# Patient Record
Sex: Female | Born: 1935
Health system: Southern US, Community
[De-identification: ages and names within clinical notes are randomized; demographics above are authoritative.]

## PROBLEM LIST (undated history)

## (undated) DIAGNOSIS — G4733 Obstructive sleep apnea (adult) (pediatric): Secondary | ICD-10-CM

## (undated) DIAGNOSIS — I4892 Unspecified atrial flutter: Secondary | ICD-10-CM

## (undated) DIAGNOSIS — R9430 Abnormal result of cardiovascular function study, unspecified: Secondary | ICD-10-CM

## (undated) DIAGNOSIS — M7661 Achilles tendinitis, right leg: Secondary | ICD-10-CM

## (undated) DIAGNOSIS — R079 Chest pain, unspecified: Secondary | ICD-10-CM

## (undated) DIAGNOSIS — M5430 Sciatica, unspecified side: Secondary | ICD-10-CM

## (undated) DIAGNOSIS — W19XXXA Unspecified fall, initial encounter: Secondary | ICD-10-CM

## (undated) DIAGNOSIS — I4891 Unspecified atrial fibrillation: Secondary | ICD-10-CM

## (undated) DIAGNOSIS — IMO0002 Reserved for concepts with insufficient information to code with codable children: Secondary | ICD-10-CM

## (undated) DIAGNOSIS — Z9889 Other specified postprocedural states: Secondary | ICD-10-CM

## (undated) HISTORY — DX: Unspecified atrial fibrillation: I48.91

## (undated) HISTORY — DX: Abnormal result of cardiovascular function study, unspecified: R94.30

## (undated) HISTORY — DX: Chest pain, unspecified: R07.9

## (undated) HISTORY — PX: OTHER SURGICAL HISTORY: SHX169

## (undated) HISTORY — DX: Unspecified atrial flutter: I48.92

## (undated) HISTORY — DX: Other specified postprocedural states: Z98.890

## (undated) HISTORY — DX: Obstructive sleep apnea (adult) (pediatric): G47.33

## (undated) HISTORY — DX: Unspecified fall, initial encounter: W19.XXXA

## (undated) HISTORY — DX: Reserved for concepts with insufficient information to code with codable children: IMO0002

## (undated) NOTE — *Deleted (*Deleted)
Patient ID: [name]   MRN: [id number]      DOB: 1931/03/13  Date of Admission: 11/13/2019 Date of Discharge: 11/17/2019  Attending Physician:  Marvel Plan, MD, Stroke MD Consultant(s):    Critical Care Medicine - Dr Lavinia Sharps   Patient's PCP:  Rodrigo Ran, MD  DISCHARGE DIAGNOSIS:   Stroke [facility])  Acute ischemic right PCA stroke [facility])  Acute respiratory failure [facility])   Anticoagulation  Atrial Fibrillation   Past Medical History:  Diagnosis Date  . Atrial fibrillation (HCC)    Prior treatment with amiodarone, held sinus rhythm and drug stopped  . Atrial flutter (HCC)    Status post ablation prior to  2009  . Chest pain    Nuclear, 2004, no scar or ischemia, EF 77%  . Ejection fraction    EF 70%, nuclear,  2004  //     . Fall    July, 2014  . H/O bladder repair surgery    Past Surgical History:  Procedure Laterality Date  . IR CT HEAD LTD  11/14/2019  . IR PERCUTANEOUS ART THROMBECTOMY/INFUSION INTRACRANIAL INC DIAG ANGIO  11/14/2019  . IR US GUIDE VASC ACCESS RIGHT  11/14/2019  . no surgical hx    . RADIOLOGY WITH ANESTHESIA N/A 11/13/2019   Procedure: IR WITH ANESTHESIA;  Surgeon: Julieanne Cotton, MD;  Location: [facility] OR;  Service: Radiology;  Laterality: N/A;    Family History No family history on file.  Social History  reports that she has quit smoking. She has never used smokeless tobacco. She reports current alcohol use. She reports that she does not use drugs.  Allergies as of 11/17/2019      Reactions   Bee Venom Anaphylaxis   Mixed Vespid Venom Anaphylaxis   Codeine Other (See Comments)   Not known   Sulfa Antibiotics Itching, Nausea Only   Erythromycin Nausea And Vomiting    Med Rec must be completed prior to using this [facility]***       HOME MEDICATIONS PRIOR TO ADMISSION Medications Prior to Admission  Medication Sig Dispense Refill  . amoxicillin-clavulanate (AUGMENTIN) 875-125 MG tablet Take 1 tablet by mouth 2 (two)  times daily.    Marland Kitchen aspirin 81 MG tablet Take 1 tablet (81 mg total) by mouth daily. 30 tablet 3  . atorvastatin (LIPITOR) 10 MG tablet Take 10 mg by mouth daily.    Marland Kitchen BIOTIN PO Take 1 tablet by mouth daily.     Marland Kitchen CALCIUM PO Take 1 tablet by mouth daily.     . Cholecalciferol (VITAMIN D3) 50 MCG (2000 UT) CAPS Take 2,000 Units by mouth daily.    . Coenzyme Q10 (CO Q-10 PO) Take 1 tablet by mouth daily.     . metoprolol succinate (TOPROL-XL) 50 MG 24 hr tablet Take 50 mg by mouth daily. Take with or immediately following a meal.    . EPINEPHrine 0.3 mg/0.3 mL IJ SOAJ injection Inject 0.3 mg into the muscle as needed for anaphylaxis.     . Investigational - Study Medication Additional Study Details    . trimethoprim (TRIMPEX) 100 MG tablet Take 100 mg by mouth as directed. (Patient not taking: Reported on 11/14/2019)       HOSPITAL MEDICATIONS .  stroke: mapping our early stages of recovery book   Does not apply Once  . amoxicillin-clavulanate  1 tablet Oral Q12H  . aspirin EC  325 mg Oral Daily  . chlorhexidine gluconate (MEDLINE KIT)  15  mL Mouth Rinse BID  . Chlorhexidine Gluconate Cloth  6 each Topical Daily  . docusate sodium  100 mg Oral BID  . heparin injection (subcutaneous)  5,000 Units Subcutaneous Q8H  . mouth rinse  15 mL Mouth Rinse QID  . pantoprazole  40 mg Oral Daily  . polyethylene glycol  17 g Oral Daily  . thrombin 5,000 units for percutaneous treatment of pseudoaneurysm   Percutaneous STAT    LABORATORY STUDIES CBC    Component Value Date/Time   WBC 9.2 11/17/2019 0220   RBC 2.87 (L) 11/17/2019 0220   HGB 9.1 (L) 11/17/2019 0220   HCT 28.3 (L) 11/17/2019 0220   PLT 283 11/17/2019 0220   MCV 98.6 11/17/2019 0220   MCH 31.7 11/17/2019 0220   MCHC 32.2 11/17/2019 0220   RDW 12.4 11/17/2019 0220   LYMPHSABS 1.5 11/13/2019 2244   MONOABS 0.8 11/13/2019 2244   EOSABS 0.2 11/13/2019 2244   BASOSABS 0.1 11/13/2019 2244   CMP    Component Value Date/Time   NA  139 11/17/2019 0220   K 4.0 11/17/2019 0220   CL 105 11/17/2019 0220   CO2 27 11/17/2019 0220   GLUCOSE 117 (H) 11/17/2019 0220   BUN 5 (L) 11/17/2019 0220   CREATININE 0.66 11/17/2019 0220   CALCIUM 8.1 (L) 11/17/2019 0220   PROT 5.1 (L) 11/15/2019 0408   ALBUMIN 2.4 (L) 11/15/2019 0408   AST 24 11/15/2019 0408   ALT 32 11/15/2019 0408   ALKPHOS 37 (L) 11/15/2019 0408   BILITOT 0.9 11/15/2019 0408   GFRNONAA >60 11/17/2019 0220   COAGS Lab Results  Component Value Date   INR 1.0 11/13/2019   Lipid Panel    Component Value Date/Time   CHOL 70 11/14/2019 0408   TRIG 126 11/14/2019 0408   TRIG 127 11/14/2019 0408   HDL 19 (L) 11/14/2019 0408   CHOLHDL 3.7 11/14/2019 0408   VLDL 25 11/14/2019 0408   LDLCALC 26 11/14/2019 0408   HgbA1C  Lab Results  Component Value Date   HGBA1C 5.7 (H) 11/14/2019   Urinalysis No results found for: COLORURINE, APPEARANCEUR, LABSPEC, PHURINE, GLUCOSEU, HGBUR, BILIRUBINUR, KETONESUR, PROTEINUR, UROBILINOGEN, NITRITE, LEUKOCYTESUR Urine Drug Screen No results found for: LABOPIA, COCAINSCRNUR, LABBENZ, AMPHETMU, THCU, LABBARB  Alcohol Level    Component Value Date/Time   ETH <10 11/13/2019 2244     SIGNIFICANT DIAGNOSTIC STUDIES  CT Code Stroke CTA Head W/WO contrast  CT Code Stroke CTA Neck W/WO contrast 11/13/2019 IMPRESSION:  1. Positive for occlusion of the Right PCA origin.  2. No other large vessel occlusion, and generally mild for age atherosclerosis in the head and neck.  3. Emphysema (ICD10-J43.9) with layering left pleural effusion, patchy left upper lobe opacity compatible with pneumonia in this setting.  4. Aortic Atherosclerosis (ICD10-I70.0)   CT HEAD WO CONTRAST 11/14/2019 IMPRESSION:  1. Stable patchy right thalamic infarct with resolved contrast staining. No significant mass effect. No other acute or evolving ischemia by CT.  2. Stable to slightly regressed trace subarachnoid blood in the interpeduncular  cistern. No other intracranial hemorrhage.   CT HEAD WO CONTRAST 11/14/2019 IMPRESSION:  1. Status post Right PCA endovascular revascularization with trace subarachnoid blood/contrast in the interpeduncular cistern.  2. Small 10 mm area of contrast staining versus petechial hemorrhage in the medial right thalamus. And question subtle increased right thalamic hypodensity elsewhere, but no other Right PCA territory infarct changes are identified.    MR ANGIO HEAD WO CONTRAST 11/14/2019 IMPRESSION:  Acute infarct right thalamus with associated mild hemorrhage as noted on recent CT. There is also mild subarachnoid hemorrhage in the interpeduncular cistern unchanged from CT. Additional small areas of acute infarct in the right cerebellum and right frontal cortex. Moderate atrophy. Chronic microvascular ischemic change in the white matter. Right PCA patent post thrombectomy. There is a mild stenosis in the right PCA at the P1/ P2 junction.   MR BRAIN WO CONTRAST 11/14/2019 IMPRESSION:  Acute infarct right thalamus with associated mild hemorrhage as noted on recent CT. There is also mild subarachnoid hemorrhage in the interpeduncular cistern unchanged from CT. Additional small areas of acute infarct in the right cerebellum and right frontal cortex. Moderate atrophy. Chronic microvascular ischemic change in the white matter. Right PCA patent post thrombectomy. There is a mild stenosis in the right PCA at the P1/ P2 junction.    US Guided Needle Placement 11/16/2019 IMPRESSION:  Status post ultrasound-guided needle placement and thrombin injection of right common femoral artery pseudoaneurysm.   [place] Lower Ext Art Right Ltd 11/16/2019 IMPRESSION:  Status post ultrasound-guided needle placement and thrombin injection of right common femoral artery pseudoaneurysm.    IR CT Head Ltd 11/14/2019 IIMPRESSION:  Status post ultrasound guided access right common femoral artery for cervical and cerebral  angiogram and mechanical thrombectomy of right posterior cerebral artery for treatment of posterior circulation ELVO, with TICI 3 flow at the conclusion. Signed, Yvone Neu. Reyne Dumas, RPVI [facility] Radiology PLAN: The patient will remain intubated, as small volume subarachnoid hemorrhage was confirmed with formal CT ICU status Target systolic blood pressure of 120-140 Right hip straight time 6 hours Frequent neurovascular checks Repeat neurologic imaging with CT and/MRI at the discretion of neurology team   IR US Guide Vasc Access Right 11/14/2019 IMPRESSION:  Status post ultrasound guided access right common femoral artery for cervical and cerebral angiogram and mechanical thrombectomy of right posterior cerebral artery for treatment of posterior circulation ELVO, with TICI 3 flow at the conclusion. Signed, Yvone Neu. Reyne Dumas, RPVI [facility] Radiology PLAN: The patient will remain intubated, as small volume subarachnoid hemorrhage was confirmed with formal CT ICU status Target systolic blood pressure of 120-140 Right hip straight time 6 hours Frequent neurovascular checks Repeat neurologic imaging with CT and/MRI at the discretion of neurology team   DG CHEST PORT 1 VIEW 11/14/2019 IMPRESSION:  Endotracheal tube placed with tip measuring 4.7 cm above the carina. Cardiac enlargement with small bilateral pleural effusions. Probable chronic interstitial changes.   VAS [place] GROIN PSEUDOANEURYSM 11/17/2019 Summary:  No evidence of pseudoaneurysm, AVF or DVT    Preliminary    VAS [place] GROIN PSEUDOANEURYSM 11/16/2019 Findings:  An area with well defined borders measuring 2.0 cm x 2.4 cm was visualized arising off of the CFA distal with ultrasound characteristics of a pseudoaneurysm. The neck measures approximately 0.3 cm wide and 0.9 cm long. A mixed echogenic structure is visualized at the medial to CFV  and CFA with ultrasound characteristics of a hematoma.    Final    ECHOCARDIOGRAM COMPLETE 11/14/2019 IMPRESSIONS   1. Left ventricular ejection fraction, by estimation, is 60 to 65%. The left ventricle has normal function. The left ventricle has no regional wall motion abnormalities. There is severe asymmetric left ventricular hypertrophy of the basal-septal segment. Left ventricular diastolic function could not be evaluated.   2. Right ventricular systolic function is normal. The right ventricular size is normal. There is mildly elevated  pulmonary artery systolic pressure.   3. Left atrial size was moderately dilated.   4. A small pericardial effusion is present. The pericardial effusion is circumferential.   5. The mitral valve is normal in structure. Trivial mitral valve regurgitation. No evidence of mitral stenosis. Moderate mitral annular calcification.   6. The aortic valve is tricuspid. There is mild calcification of the aortic valve. There is mild thickening of the aortic valve. Aortic valve regurgitation is trivial. Mild aortic valve sclerosis is present, with no evidence of aortic valve stenosis.   7. The inferior vena cava is normal in size with greater than 50% respiratory variability, suggesting right atrial pressure of 3 mmHg. Conclusion(s)/Recommendation(s): No intracardiac source of embolism detected on this transthoracic study. A transesophageal echocardiogram is recommended to exclude cardiac source of embolism if clinically indicated. Basal septal hypertrophy of LV that is severe, otherwise diffuse moderate LVH. No focal wall motion abnormalities.   IR PERCUTANEOUS ART THROMBECTOMY/INFUSION INTRACRANIAL INC DIAG ANGIO 11/14/2019 IMPRESSION:  Status post ultrasound guided access right common femoral artery for cervical and cerebral angiogram and mechanical thrombectomy of right posterior cerebral artery for treatment of posterior circulation ELVO, with TICI 3 flow at the  conclusion. Signed, Yvone Neu. Reyne Dumas, RPVI [facility] Radiology PLAN: The patient will remain intubated, as small volume subarachnoid hemorrhage was confirmed with formal CT ICU status Target systolic blood pressure of 120-140 Right hip straight time 6 hours Frequent neurovascular checks Repeat neurologic imaging with CT and/MRI at the discretion of neurology team   CT HEAD CODE STROKE WO CONTRAST 11/13/2019 IMPRESSION:  Age indeterminate small vessel disease including in the right thalamus, but no acute cortically based infarct or acute intracranial hemorrhage identified. ASPECTS 10.      HISTORY OF PRESENT ILLNESS (From H&P by Dr Iver Nestle 11/13/19) [name] is a 79 y.o. female with a PMHx of Afib/flutter s/p ablation, off AC due to persistent sinus rhythm, and a recent diagnosis of pneumonia, presenting with acute onset right gaze preference and left-sided weakness. She was last seen well by her family at about 9:30 PM. She was watching television with her husband and when her son passed by at about 10 p.m. he noticed that she was not as responsive to him as she normally is. As he checked on her more he became concerned that she was unwell and activated EMS. Regarding her pneumonia, her only symptoms were generalized fatigue none. She was not improving on the first round of antibiotics tried and therefore was broadened to Augmentin on Monday. She was overall slowly improving. Family and patient deny other symptoms such as headache, vision changes, fevers, chills, sweats, shortness of breath. LKW: 9:15  PM tPA given?: Yes, time given 10:59 PM IA performed?: Yes Premorbid modified rankin scale:      0 - No symptoms Initial NIH stroke scale was 11:             2 for answering month and age incorrectly,1 for gaze preference to the right, 2 4 and left facial droop, on 1 for left arm drift, 1 for right leg drift, 1 for left leg drift, 1 for  reduced sensation on the left upper extremity, 1 for dysarthria, and 1 for extinction (gaze preference to the right with head turn to the right)  HOSPITAL COURSE [name] is a 33 y.o. female with history of Afib/flutter s/p ablation, off AC due to persistent sinus rhythm,and a recent diagnosis of pneumonia (  she was not improving on the first round of antibiotics tried and therefore was broadened to Augmentin on Monday), presenting with acute onset right gaze preference and left-sided weakness. The patient received IV t-PA Tuesday 11/19 at 10:59 PM and sent to IR for R PCA occlusion.  Stroke: R thalamic, cerebellar, and frontal small infarcts with right PCA occlusion s/p tPA and IR with TICI3 from AF not on AC Small SAH interpeduncular cistern post IR  CT Head - Age indeterminate small vessel disease including in the right thalamus, but no acute cortically based infarct or acute intracranial hemorrhage identified.   CTA H&N - Positive for occlusion of the Right PCA origin. No other large vessel occlusion, and generally mild for age atherosclerosis in the head and neck.   Cerebral angio/ IR - Rt PCA occlusion with TICI 3 flow   CT head - Status post Right PCA endovascular revascularization with trace subarachnoid blood/contrast in the interpeduncular cistern. Small 10 mm area of contrast staining versus petechial hemorrhage in the medial right thalamus.   MRI head - R thalamus infarct with associated mild hemorrhage as noted on recent CT. There is also mild subarachnoid hemorrhage in the interpeduncular cistern unchanged from CT. Additional small areas of acute infarct in the right cerebellum and right frontal cortex. Moderate atrophy   MRA head -  Right PCA patent post thrombectomy. There is a mild stenosis in the right PCA at the P1/ P2 junction.   2D Echo - EF 60-65%. No source of embolus. LA moderately dilated  [employer] Virus 2 - negative  LDL - 26  HgbA1c - 5.7  VTE  prophylaxis - Heparin 5000 units sq tid   aspirin 81 mg daily prior to admission, now on aspirin 325 mg daily. Consider AC once SAH resolved (probably in 5-7 days post stroke)    Therapy recommendations:  HH OT, HH PT  Disposition:  Pending - anticipate d/c home 10/23 if remains stable  R Groin Hematoma  Developed hospital D#2 at angio puncture site  Groin pseudoaneurysm [place] 2.0 x 2.4 R CFA pseudoaneurysm w/ hematoma  Thrombin injection 10/22 [address])  Repeat US 10/23 preliminary - no evidence of pseudoaneurysm - preliminary report.  Acute Respiratory Failure, resolved  Intubated for IR, left intubated post IR   Sedated w/ fentanyl and propofol, now off  Extubated 10/20  Patient stable  CCM signed off   Atrial Fibrillation/Aflutter  Home anticoagulation:  none   post ablation Dr. Gentry Fitz 2004. Doing very well. No further recurrences.  No AC now given post tPA small SAH   Now on aspirin 325  Consider AC once SAH resolved (probably in 5-7 days post stroke)     Follows with Dr. Anne Fu at [facility]   Community Acquired PNA  No response to intial abx  Broadened as OP to Augmention 10/18  Still has intermittent coughing  CTA - layering left pleural effusion, patchy left upper lobe opacity compatible with pneumonia  CXR 10/20 - small bilateral pleural effusions. Probable chronic interstitial changes.  WBCs 6.3->7.6->7.3 (afebrile)  Hypertension  Home BP meds: metoprolol   Treated short term w/ cleviprex, off this am  BP stable  BP goal < 160   Long-term BP goal normotensive  Hyperlipidemia  Home Lipid lowering medication: Lipitor 10 mg daily  LDL 26, goal < 70  Will not resume lipitor due to low LDL level  Dysphagia, resolved  Secondary to stroke  Cleared for regular consistency, thin liquid diet  Off IVF  Sore throat after extubation   Other Stroke Risk Factors  Advanced age  Former cigarette smoker - quit  ETOH use,  advised to drink no more than 1 alcoholic beverage per day.  Other Active Problems  Code status - Full code  Emphysema ([id number])   Aortic Atherosclerosis (ICD10-I70.0)   Anemia - Hgb - 12.8->13.3-> 10.5->9.8->8.8->9.1  Hypokalemia - K 3.8->4.0   DISCHARGE EXAM Vitals:   11/17/19 0230 11/17/19 0350 11/17/19 0627 11/17/19 0717  BP: (!) 167/78 (!) 160/69 (!) 167/79 (!) 149/83  Pulse: 86 94 92 90  Resp: 18 18 18 18   Temp:  98.6 F (37 C)  98.8 F (37.1 C)  TempSrc:  Oral  Oral  SpO2: 94% 94% 98% 97%  Weight:      Height:       General - Well nourished, well developed, no acute distress.  Ophthalmologic - fundi not visualized due to noncooperation.  Cardiovascular - irregularly irregular heart rate and rhythm.  Mental Status -  Level of arousal and orientation to place, age, month and person were intact, however, not orientated to year. Language exam showed no aphasia, able to name and repeat, follow simple commands.    Cranial Nerves II - XII - II - Visual field intact OU. III, IV, VI - Extraocular movements intact. V - Facial sensation intact bilaterally. VII - subtle left nasolabial fold flattening. VIII - Hearing & vestibular intact bilaterally. X - Palate elevates symmetrically. XI - Chin turning & shoulder shrug intact bilaterally. XII - Tongue protrusion intact.  Motor Strength - The patient's strength was normal in right upper and lower extremities, left upper extremity drift with mildly decreased finger grip, left LE 4/5 proximal and 4+/5 distally.  Bulk was normal and fasciculations were absent.   Motor Tone - Muscle tone was assessed at the neck and appendages and was normal.  Reflexes - The patient's reflexes were symmetrical in all extremities and she had no pathological reflexes.  Sensory - Light touch, temperature/pinprick were assessed and were symmetrical.    Coordination - The patient had normal movements in the hands with no ataxia  or dysmetria.  Tremor was absent.  Gait and Station - deferred.  DISCHARGE INSTRUCTIONS GIVEN TO THE PATIENT   DISCHARGE DIET   Diet Order            Diet Heart Room service appropriate? Yes; Fluid consistency: Thin  Diet effective now                liquids  DISCHARGE PLAN  Disposition:  ***  {anticoagulants:31417} for secondary stroke prevention.  Ongoing risk factor control by Primary Care Physician at time of discharge  Follow-up Rodrigo Ran, MD in 2 weeks.  Follow-up in [facility] in 4 weeks, office to schedule an appointment.   *** minutes were spent preparing discharge.  ***

## (undated) NOTE — *Deleted (*Deleted)
PMR Admission Coordinator Pre-Admission Assessment  Patient: [name] is an 51 y.o., female MRN: [id number] DOB: 1932/01/08 Height: 5\' 1"  (154.9 cm) Weight: 66.4 kg  Insurance Information HMO:     PPO:      PCP:      IPA:      80/20: yes     OTHER:  PRIMARY: Medicare A & B      Policy#: [id number]      Subscriber: patient CM Name:       Phone#:      Fax#:  Pre-Cert#:       Employer:  Benefits:  Phone #: verified online via [employer] on 11/17/19     Name:  Eff. Date: Part A & B effective 01/25/1998     Deduct: $1,4848      Out of Pocket Max: NA      Life Max: NA CIR: 100% with Medicare approval      SNF: 100% days 1-20, 80% days 21-100 Outpatient: 80%     Co-Pay: 20% Home Health: 100%      Co-Pay:  DME: 80%     Co-Pay: 20% Providers: pt's choice SECONDARY:       Policy#:      Phone#:   Artist:       Phone#:   The Engineer, materials Information Summary" for patients in Inpatient Rehabilitation Facilities with attached "Privacy Act Statement-Health Care Records" was provided and verbally reviewed with: {CHL IP Patient Family WU:981191478}  Emergency Contact Information Contact Information    Name Relation Home Work Mobile   Weiser Spouse [phone]     Unique, Sillas   [phone]   [name] Daughter   [phone]   Marius Ditch Daughter   [phone]   Swanson,Sidonie Daughter   [phone]      Current Medical History  Patient Admitting Diagnosis: acute ischemic right PCA stroke History of Present Illness: *** Complete NIHSS TOTAL: 2  Patient's medical record from [facility] has been reviewed by the rehabilitation admission coordinator and physician.  Past Medical History  Past Medical History:  Diagnosis Date  . Atrial fibrillation (HCC)    Prior treatment with amiodarone, held sinus rhythm and drug stopped  . Atrial flutter (HCC)    Status post ablation prior to  2009  . Chest pain    Nuclear, 2004, no scar or ischemia,  EF 77%  . Ejection fraction    EF 70%, nuclear,  2004  //     . Fall    July, 2014  . H/O bladder repair surgery     Family History   family history is not on file.  Prior Rehab/Hospitalizations Has the patient had prior rehab or hospitalizations prior to admission? No  Has the patient had major surgery during 100 days prior to admission? Yes   Current Medications  Current Facility-Administered Medications:  .   stroke: mapping our early stages of recovery book, , Does not apply, Once, Bhagat, Srishti L, MD .  acetaminophen (TYLENOL) tablet 650 mg, 650 mg, Oral, Q4H PRN **OR** acetaminophen (TYLENOL) 160 MG/5ML solution 650 mg, 650 mg, Per Tube, Q4H PRN **OR** acetaminophen (TYLENOL) suppository 650 mg, 650 mg, Rectal, Q4H PRN, Bhagat, Srishti L, MD .  amoxicillin-clavulanate (AUGMENTIN) 875-125 MG per tablet 1 tablet, 1 tablet, Oral, Q12H, Bhagat, Srishti L, MD, 1 tablet at 11/17/19 1029 .  [START ON 11/18/2019] apixaban (ELIQUIS) tablet 5 mg, 5 mg, Oral, BID, Gabrielly, Mccrystal, RPH .  bisacodyl (DULCOLAX) suppository 10  mg, 10 mg, Rectal, Daily PRN, Rinehuls, David L, PA-C .  chlorhexidine gluconate (MEDLINE KIT) (PERIDEX) 0.12 % solution 15 mL, 15 mL, Mouth Rinse, BID, Karl Ito, MD, 15 mL at 11/16/19 2126 .  docusate sodium (COLACE) capsule 100 mg, 100 mg, Oral, BID, Marvel Plan, MD, 100 mg at 11/17/19 1028 .  heparin injection 5,000 Units, 5,000 Units, Subcutaneous, Q8H, Anson Fret, MD, 5,000 Units at 11/17/19 1330 .  labetalol (NORMODYNE) injection 5-20 mg, 5-20 mg, Intravenous, Q2H PRN, Marvel Plan, MD .  ondansetron [facility]) injection 4 mg, 4 mg, Intravenous, Q6H PRN, Marvel Plan, MD .  pantoprazole (PROTONIX) EC tablet 40 mg, 40 mg, Oral, Daily, Marvel Plan, MD, 40 mg at 11/17/19 1029 .  polyethylene glycol (MIRALAX / GLYCOLAX) packet 17 g, 17 g, Oral, Daily, Hicks, Samantha B, NP, 17 g at 11/17/19 1029 .  senna-docusate (Senokot-S) tablet 2 tablet, 2 tablet,  Oral, QHS PRN, Bhagat, Srishti L, MD, 2 tablet at 11/17/19 1028  Patients Current Diet:  Diet Order            Diet Heart Room service appropriate? Yes with Assist; Fluid consistency: Thin  Diet effective 1000                 Precautions / Restrictions Precautions Precautions: Fall Precaution Comments: watch sats Restrictions Weight Bearing Restrictions: No   Has the patient had 2 or more falls or a fall with injury in the past year? No  Prior Activity Level Community (5-7x/wk): drives; leaves house everyday  Prior Functional Level Self Care: Did the patient need help bathing, dressing, using the toilet or eating? Independent  Indoor Mobility: Did the patient need assistance with walking from room to room (with or without device)? Independent  Stairs: Did the patient need assistance with internal or external stairs (with or without device)? Needed some help  Functional Cognition: Did the patient need help planning regular tasks such as shopping or remembering to take medications? Independent  Home Assistive Devices / Equipment Home Equipment: Cane - single point  Prior Device Use: Indicate devices/aids used by the patient prior to current illness, exacerbation or injury? cane  Current Functional Level Cognition  Arousal/Alertness: Awake/alert Overall Cognitive Status: Impaired/Different from baseline Current Attention Level: Sustained Orientation Level: Oriented X4 Following Commands: Follows multi-step commands inconsistently Safety/Judgement: Decreased awareness of deficits General Comments: administered pill box assessment with pt taking > to complete task and finished tasks with > 3 errors. pt with no awareness to errors stating " well I do it different at home." Attention: Sustained Sustained Attention: Impaired Sustained Attention Impairment: Verbal basic, Functional basic Memory: Impaired Memory Impairment: Retrieval deficit, Decreased recall of new  information, Decreased short term memory Decreased Short Term Memory: Verbal basic, Functional basic Awareness: Impaired Awareness Impairment: Emergent impairment Problem Solving: Impaired Problem Solving Impairment: Verbal complex, Functional complex Executive Function: Landscape architect: Impaired Organizing Impairment: Verbal basic, Functional basic Safety/Judgment: Impaired Comments: Stated she could go home and "function close to normal"    Extremity Assessment (includes Sensation/Coordination)  Upper Extremity Assessment: LUE deficits/detail LUE Deficits / Details: decreased coordination, edema in UE  LUE Sensation: decreased light touch LUE Coordination: decreased fine motor, decreased gross motor  Lower Extremity Assessment: Defer to PT evaluation    ADLs  Overall ADL's : Needs assistance/impaired Grooming: Minimal assistance, Sitting Upper Body Bathing: Minimal assistance, Sitting Lower Body Bathing: Minimal assistance, Sit to/from stand Upper Body Dressing : Minimal assistance, Sitting Lower Body Dressing: Minimal assistance,  Sit to/from Pharmacist, hospital Details (indicate cue type and reason): deferred, pt fatigued Functional mobility during ADLs: Minimal assistance, Rolling walker, Cueing for safety, Cueing for sequencing General ADL Comments: session focus on cognitive tasks related  to IADL tasks, administered the pillbox assesssment with pt failing assessment.    Mobility  Overal bed mobility: Needs Assistance Bed Mobility: Supine to Sit Supine to sit: Mod assist General bed mobility comments: pt required MOD +2 to repositon pt in bed; noted L lateral lean with pt in upright position in bed    Transfers  Overall transfer level: Needs assistance Equipment used: None Transfers: Sit to/from Stand Sit to Stand: Max assist, From elevated surface, +2 safety/equipment (FACE to [employer] assistance to move from surface to surface.) General transfer comment: session  focus on cognitive tasks    Ambulation / Gait / Stairs / Wheelchair Mobility  Ambulation/Gait Ambulation/Gait assistance:  (Unable due to dizziness.) Gait Distance (Feet): 20 Feet Assistive device: 2 person hand held assist Gait Pattern/deviations: Step-through pattern, Decreased stride length, Shuffle General Gait Details: cues for safety, sequence and bil UE support for balance would benefit from RW for future trials, limited by fatigue Gait velocity interpretation: 1.31 - 2.62 ft/sec, indicative of limited community ambulator    Posture / Balance Dynamic Sitting Balance Sitting balance - Comments: posterior lean. Balance Overall balance assessment: Needs assistance Sitting-balance support: No upper extremity supported, Feet supported Sitting balance-Leahy Scale: Poor Sitting balance - Comments: posterior lean. Standing balance support: Bilateral upper extremity supported Standing balance-Leahy Scale: Zero Standing balance comment: single and double UE support to stand    Special needs/care consideration Skin ecchymosis: arm, groin/right; abrasion: right arm; surgical incision: groin, right/anterior, External urinary catheter and Designated visitor Marius Ditch, daughter; Ellis Parents, son; Avelino Leeds, daughter; [name], daughter   Previous Home Environment (from acute therapy documentation) Living Arrangements: Spouse/significant other  Lives With: Spouse Available Help at Discharge: Family, Available 24 hours/day Type of Home: House Home Layout: One level Home Access: Stairs to enter Entrance Stairs-Rails: Right Entrance Stairs-Number of Steps: 1 Bathroom Shower/Tub: Tub only, Health visitor: Standard Bathroom Accessibility: Yes How Accessible: Accessible via walker Home Care Services: No  Discharge Living Setting Plans for Discharge Living Setting: Patient's home Type of Home at Discharge: House Discharge Home Layout: One level Discharge  Home Access: Stairs to enter Entrance Stairs-Rails: Right Entrance Stairs-Number of Steps: 1 Discharge Bathroom Shower/Tub: Tub only, Walk-in shower Discharge Bathroom Toilet: Standard Discharge Bathroom Accessibility: Yes How Accessible: Accessible via walker Does the patient have any problems obtaining your medications?: No  Social/Family/Support Systems Anticipated Caregiver: Vivi Barrack Anticipated Caregiver's Contact Information: Tawanna Cooler: [phone], [name]: [phone], Sidonie: [phone],Andrea: [phone] Caregiver Availability: 24/7 Discharge Plan Discussed with Primary Caregiver: Yes Is Caregiver In Agreement with Plan?: Yes Does Caregiver/Family have Issues with Lodging/Transportation while Pt is in Rehab?: No  Goals Pt/Family Agrees to Admission and willing to participate: Yes Program Orientation Provided & Reviewed with Pt/Caregiver Including Roles  & Responsibilities: Yes  Decrease burden of Care through IP rehab admission: Na  Possible need for SNF placement upon discharge: NA  Patient Condition: {PATIENT'S CONDITION:22832}  Preadmission Screen Completed By:  Domingo Pulse, 11/17/2019 4:49 PM ______________________________________________________________________   Discussed status with Dr. Marland Kitchen on *** at *** and received approval for admission today.  Admission Coordinator:  Domingo Pulse, CCC-SLP, time ***Dorna Bloom ***   Assessment/Plan: Diagnosis: 1. Does the need for close, 24 hr/day Medical supervision in concert with  the patient's rehab needs make it unreasonable for this patient to be served in a less intensive setting? {yes_no_potentially:3041433} 2. Co-Morbidities requiring supervision/potential complications: *** 3. Due to {due ZO:1096045}, does the patient require 24 hr/day rehab nursing? {yes_no_potentially:3041433} 4. Does the patient require coordinated care of a physician, rehab nurse, PT, OT, and SLP to  address physical and functional deficits in the context of the above medical diagnosis(es)? {yes_no_potentially:3041433} Addressing deficits in the following areas: {deficits:3041436} 5. Can the patient actively participate in an intensive therapy program of at least 3 hrs of therapy 5 days a week? {yes_no_potentially:3041433} 6. The potential for patient to make measurable gains while on inpatient rehab is {potential:3041437} 7. Anticipated functional outcomes upon discharge from inpatient rehab: {functional outcomes:304600100} PT, {functional outcomes:304600100} OT, {functional outcomes:304600100} SLP 8. Estimated rehab length of stay to reach the above functional goals is: *** 9. Anticipated discharge destination: {anticipated dc setting:21604} 10. Overall Rehab/Functional Prognosis: {potential:3041437}   MD Signature: ***

## (undated) NOTE — *Deleted (*Deleted)
Speech Language Pathology Daily Session Note  Patient Details  Name: [name] MRN: [id number] Date of Birth: 02/19/1931  {chl ip rehab slp time calculations:304100500}  Short Term Goals: Week 1: SLP Short Term Goal 1 (Week 1): Pt will recall daily events and novel information with supervision A verbal cues for use of external/internal aids. SLP Short Term Goal 2 (Week 1): Pt will complete basic-mildly complex problem solving tasks with min A verbal cues. SLP Short Term Goal 3 (Week 1): Pt will self-monitory and self-correct errors during problem solving tasks with mod A verbal cues. SLP Short Term Goal 4 (Week 1): Pt will demonstrate sustained attention in 15 minute intervals during mildly complex problem solving tasks with supervision A verbal cues for redirection.  Skilled Therapeutic Interventions: Pt was seen for skilled ST targeting       Pain    Therapy/Group: Individual Therapy  [name] 11/28/2019, 6:30 PM

---

## 1997-12-25 ENCOUNTER — Ambulatory Visit (HOSPITAL_COMMUNITY): Admission: RE | Admit: 1997-12-25 | Discharge: 1997-12-25 | Payer: Self-pay | Admitting: Obstetrics & Gynecology

## 1998-02-20 ENCOUNTER — Other Ambulatory Visit: Admission: RE | Admit: 1998-02-20 | Discharge: 1998-02-20 | Payer: Self-pay | Admitting: Obstetrics and Gynecology

## 1998-09-05 ENCOUNTER — Ambulatory Visit (HOSPITAL_COMMUNITY): Admission: RE | Admit: 1998-09-05 | Discharge: 1998-09-05 | Payer: Self-pay | Admitting: *Deleted

## 1998-09-05 ENCOUNTER — Encounter (INDEPENDENT_AMBULATORY_CARE_PROVIDER_SITE_OTHER): Payer: Self-pay | Admitting: Specialist

## 1998-12-31 ENCOUNTER — Encounter: Admission: RE | Admit: 1998-12-31 | Discharge: 1998-12-31 | Payer: Self-pay | Admitting: Internal Medicine

## 1998-12-31 ENCOUNTER — Encounter: Payer: Self-pay | Admitting: Internal Medicine

## 2000-11-22 ENCOUNTER — Encounter: Payer: Self-pay | Admitting: Urology

## 2000-11-28 ENCOUNTER — Inpatient Hospital Stay (HOSPITAL_COMMUNITY): Admission: RE | Admit: 2000-11-28 | Discharge: 2000-11-30 | Payer: Self-pay | Admitting: Urology

## 2001-05-03 ENCOUNTER — Encounter: Admission: RE | Admit: 2001-05-03 | Discharge: 2001-05-03 | Payer: Self-pay | Admitting: Internal Medicine

## 2001-05-03 ENCOUNTER — Encounter: Payer: Self-pay | Admitting: Internal Medicine

## 2001-05-09 ENCOUNTER — Other Ambulatory Visit: Admission: RE | Admit: 2001-05-09 | Discharge: 2001-05-09 | Payer: Self-pay | Admitting: Obstetrics and Gynecology

## 2005-02-04 ENCOUNTER — Inpatient Hospital Stay (HOSPITAL_COMMUNITY): Admission: EM | Admit: 2005-02-04 | Discharge: 2005-02-06 | Payer: Self-pay | Admitting: Emergency Medicine

## 2005-02-04 ENCOUNTER — Ambulatory Visit: Payer: Self-pay | Admitting: *Deleted

## 2005-02-15 ENCOUNTER — Ambulatory Visit: Payer: Self-pay | Admitting: Internal Medicine

## 2005-02-15 ENCOUNTER — Ambulatory Visit (HOSPITAL_COMMUNITY): Admission: RE | Admit: 2005-02-15 | Discharge: 2005-02-16 | Payer: Self-pay | Admitting: Internal Medicine

## 2005-02-23 ENCOUNTER — Ambulatory Visit: Payer: Self-pay | Admitting: Internal Medicine

## 2005-05-10 ENCOUNTER — Ambulatory Visit: Payer: Self-pay | Admitting: Internal Medicine

## 2005-10-26 ENCOUNTER — Ambulatory Visit: Payer: Self-pay | Admitting: Internal Medicine

## 2006-10-26 ENCOUNTER — Ambulatory Visit: Payer: Self-pay | Admitting: Internal Medicine

## 2007-11-01 ENCOUNTER — Ambulatory Visit: Payer: Self-pay | Admitting: Internal Medicine

## 2009-04-03 ENCOUNTER — Telehealth: Payer: Self-pay | Admitting: Internal Medicine

## 2009-10-08 ENCOUNTER — Encounter: Admission: RE | Admit: 2009-10-08 | Discharge: 2009-10-08 | Payer: Self-pay | Admitting: Internal Medicine

## 2010-02-26 NOTE — Progress Notes (Signed)
Summary: LEFT SHOULDER PAIN,PALE IN FACE,VERY WEAK BUT NO CHEST PAINS  Phone Note Call from Patient Call back at Home Phone [phone]   Caller: Daughter/STEPHINE Summary of Call: LEFT SHOULDER PAIN ,PALE AND WEAK. NO CHEST PAINS Initial call taken by: Judie Grieve,  April 03, 2009 8:48 AM  Follow-up for Phone Call        S/W Luther Parody (another daughter) and she said her mother has a cardiac history and she had gotten a Venom shot yesterday that she normally gets and today her left shoulder is hurting and she is pale, nauseated and weak. She has NO CP, NO SOB, NO radiation of pain. She got the shot in the left arm. I advised her to call the MD who gave her the shot or her PCP, that it sounds more like some sort of reaction from the shot and not her heart. She understands. Follow-up by: Duncan Dull, RN, BSN,  April 03, 2009 9:16 AM

## 2010-06-09 NOTE — Assessment & Plan Note (Signed)
[facility]                         ELECTROPHYSIOLOGY OFFICE NOTE   EMMACLAIRE, SWITALA                        MRN:          [id number]  DATE:10/26/2006                            DOB:          1931/03/16    Ms. Pedraza returns today for followup.  She is a very pleasant 77-year-  old woman with a history of paroxysmal atrial fibrillation and left  atrial flutter, as well as sinus bradycardia, on amiodarone, who returns  today for followup.  When I saw her back a year ago, she was complaining  of fatigue and weakness and she was bradycardic and for this reason, we  stopped her amiodarone.  She returns today for followup.  She has had no  significant palpitations and otherwise has been stable.   MEDICATIONS:  1. Toprol-XL 25 twice daily.  2. Aspirin 325 daily.  3. Multiple vitamins.  4. Fish oils.   PHYSICAL EXAMINATION:  She is a pleasant, well-appearing woman in no  acute distress.  Blood pressure was 102/72, the pulse was 79 and regular, the  respirations were 16, the weight was 148 pounds.  NECK:  No jugulovenous distention.  LUNGS:  Clear bilaterally to auscultation; no wheezes, rales or rhonchi  were present.  CARDIOVASCULAR:  Regular rate and rhythm with a normal S1 and S2.  EXTREMITIES:  Demonstrated no edema.   IMPRESSION:  1. Paroxysmal atrial fibrillation.  2. Sinus bradycardia (amiodarone induced).   DISCUSSION:  Overall, Ms. Transue is stable.  Her atrial fibrillation is  well-controlled at the present time and her heart rate has improved off  of amiodarone.  She will continue her beta blocker and her aspirin  therapy and I will see her back in a year.     Doylene Canning. Ladona Ridgel, MD  Electronically Signed    GWT/MedQ  DD: 10/26/2006  DT: 10/27/2006  Job #: [id number]   cc:   Theressa Millard, M.D.

## 2010-06-09 NOTE — Assessment & Plan Note (Signed)
[facility]                         ELECTROPHYSIOLOGY OFFICE NOTE   [name], [name]                        MRN:          [id number]  DATE:11/01/2007                            DOB:          23-Jan-1932    [name] returns today for followup.  She is a very pleasant 76-year-  old woman with a history of paroxysmal AFib as well as a history of  atrial flutter, status post ablation.  She also had left atrial flutters  at EP study.  She was initially placed on amiodarone, but this was  discontinued a year ago.  She returns today for followup.  She denies  chest pain.  She denies shortness of breath.  He has been quite stable.   MEDICATIONS:  1. Toprol-XL 25 twice a day.  2. Aspirin 325 a day.  3. Multivitamins.  4. Calcium and vitamin D supplement.   PHYSICAL EXAMINATION:  GENERAL:  She is a pleasant well-appearing 85-  year-old woman, in no distress.  VITAL SIGNS:  Blood pressure was 136/73, the pulse 70 and regular, and  the respirations were 18.  Weight was 147 pounds.  NECK:  Revealed no jugular venous distention.  LUNGS:  Clear bilaterally to auscultation.  No wheezes, rales, or  rhonchi are present.  CARDIAC:  Revealed a regular rate and rhythm and normal S1 and S2.  ABDOMEN:  Soft and nontender.  EXTREMITIES:  Demonstrate no edema.   IMPRESSION:  1. Atrial flutter, status post ablation.  2. Atrial fibrillation.  3. Previously on amiodarone therapy, now off with no recurrence of      symptomatic atrial fibrillation.   DISCUSSION:  Overall, [name] is stable and her heart is maintained  in sinus rhythm.  Her blood pressure was reasonably well controlled.  I  will plan to see the patient back for EP followup in 1 year.     Doylene Canning. Ladona Ridgel, MD  Electronically Signed    GWT/MedQ  DD: 11/01/2007  DT: 11/02/2007  Job #: [id number]

## 2010-06-12 NOTE — Op Note (Signed)
[facility]  Patient:    [name], [name] Visit Number: [id number] MRN: [id number]          Service Type: SUR Location: 3W [id number] Attending Physician:  Liborio Nixon Proc. Date: 11/28/00 Admit Date:  11/28/2000   CC:         Laqueta Linden, M.D.   Operative Report  PREOPERATIVE DIAGNOSIS:  Stress urinary incontinence.  POSTOPERATIVE DIAGNOSIS:  Stress urinary incontinence.  PRINCIPAL PROCEDURE:  Pubovaginal sling with cadaveric fascia lata.  SURGEON:  Bertram Millard. Dahlstedt, M.D.  ANESTHESIA:  General endotracheal.  COMPLICATIONS:  None.  BRIEF HISTORY:  A 77 year old white female, who has significant stress urinary incontinence.  She has had prior anterior and posterior repair.  With this stress urinary incontinence getting worse with time, it is bothering this lady significantly.  She recently presented to Dr. Mickel Crow for evaluation.  This revealed typical, uncomplicated stress urinary incontinence.  She desired surgical management.  The risks and complications as well as alternatives have been discussed with the patient who desires to proceed.  DESCRIPTION OF PROCEDURE:  The patient was administered an IV antibiotic prior to induction of general endotracheal anesthetic,  She was placed in the dorsal lithotomy position.  Genitalia, perineum, and lower abdomen were prepped and draped.  A 3 cm incision was made in her suprapubic region transversely and carried down to rectus fascia with electrocautery.  The vaginal submucosa was infiltrated with 1% lidocaine with epinephrine.  An incision was made from her bladder neck to just proximal to the urethral meatus.  Flaps of vaginal mucosa were raised bilaterally, and the pubocervical fascia was pierced bilaterally. Access to the retropubic space was gained, and the bladder was carefully dissected off the pubis.  The bladder had been catheterized with a Foley catheter and 10 cc of  water placed in the balloon.  A 2 cm x 7 cm piece of prepared fascia lata was then run with helical sutures of 0 Prolene at each end.  The sutures were then passed through the space of Retzius with the Raz needle, and the fascia was placed at the bladder neck and proximal urethra. It was tacked to both of these areas with 4-0 Vicryl.  The bladder was inspected, and no sutures were seen.  Urine was seen coming from each ureteral orifice.  The sutures were then tied gently over top of the rectus fascia, being careful not to put too much tension.  The sutures were then tied to each other over the midline.  The bladder was filled and without crede maneuver, there was leakage of urine under good pressure from the urethra.  At this point, the vaginal mucosa was closed using a running 2-0 chromic.  The infrapubic incision was closed using a running 4-0 Vicryl placed in the subcuticular fashion.  Bonnano catheter was placed into the bladder superior to the infrapubic incision and sewn to the skin using 3-0 nylon.  This was hooked to dependent drainage.  A vaginal pack was placed.  The patient tolerated the procedure well.  She was awakened, extubated, and taken to the PACU in stable condition. Attending Physician:  Liborio Nixon DD:  11/28/00 TD:  11/29/00 Job: [id number]

## 2010-06-12 NOTE — Op Note (Signed)
[name], [name]                 ACCOUNT NO.:  [id number]   MEDICAL RECORD NO.:  [id number]          PATIENT TYPE:  OIB   LOCATION:  6524                         FACILITY:  [facility]   PHYSICIAN:  Doylene Canning. Ladona Ridgel, M.D.  DATE OF BIRTH:  01/14/1932   DATE OF PROCEDURE:  02/15/2005  DATE OF DISCHARGE:                                 OPERATIVE REPORT   PROCEDURE PERFORMED:  Electrophysiologic study and radiofrequency catheter  ablation of atrial flutter room.   The patient is a 77 year old woman with a history of recurrent tachy  palpitations and syncope and documented atrial flutter. She also has a  documented atypical flutter versus atrial tachycardia and was admitted to  the hospital for electrophysiologic study and catheter ablation.   DESCRIPTION OF PROCEDURE:  After informed consent was obtained, the patient  was taken to the diagnostic EP laboratory in the fasting state.  After the  usual preparation and draping, intravenous fentanyl and midazolam were given  for sedation. A 6 French flexible catheter was inserted percutaneously into  the right jugular vein and advanced to the coronary sinus.  A 5 French  quadripolar catheter was inserted percutaneously in the right femoral vein  and advanced to the RV apex.  A 5 French quadripolar catheter was inserted  percutaneously in the right femoral vein and advanced the His bundle region.  After measurement of the basic intervals, rapid ventricular pacing was  carried out from the RV apex demonstrating VA dissociation at 600  milliseconds.  Programmed ventricular stimulation was also carried out at  600 milliseconds, demonstrating VA dissociation from the RV apex.  Next,  programmed atrial stimulation was carried out from the coronary sinus in the  high right atrium with base drive cycle length of 161 milliseconds.  The S1,  S2 intervals stepwise decreased down to 360 milliseconds where the A-V node  ERP was observed.  Additional decrements  were carried out down to 220  milliseconds where atrial refractoriness was observed.  During programmed  stimulation, there was no inducible SVT.  It should be noted that following  the initial ablation procedure, programmed atrial stimulation was carried  out at a base drive cycle length of 096 milliseconds and stepwise decreased  down to 270 milliseconds where atrial refractoriness was again observed.  During programmed atrial stimulation, there was no inducible SVT.  Next,  rapid atrial pacing was carried out from the coronary sinus in the high  right atrium, pacing cycle length of 600 milliseconds stepwise decreased  down to 410 milliseconds where A-V Wenckebach was observed. Additional rapid  atrial pacing was carried out down to 200 milliseconds.  This resulted in  the initiation of atrial flutter (this was typical counterclockwise  tricuspid annular re-entrant atrial flutter). It was easily inducible and  terminated spontaneously.  At this point, the ablation catheter was  maneuvered in the usual atrial flutter isthmus and a total of  five RF  energy applications were delivered in the atrial flutter isthmus, resulting  in atrial flutter isthmus block.  Following catheter ablation, atrial  flutter could not be reproducibly  induced.  There was spontaneously  occurring atypical flutter at a cycle length of 250 milliseconds, which  would terminate spontaneously.  Attempts to try and perform entrainment  mapping of this flutter demonstrated no early activation either in the  lateral right atrium, lateral left atrium, Koch's triangle, or in the os of  the coronary sinus.  In addition, the patient had a spontaneous-occurring  atrial tachycardia, again not inducible and again nonsustained, which  appeared to map to the high right atrium, although confirmatory mapping  could not be carried out because of the nonsustained nature the tachycardia.  At this point, because after ablation no  inducible arrhythmias could be  reproducibly induced nor sustained, the catheters were removed, hemostasis  was assured, and the patient was returned to her room in satisfactory  condition.   COMPLICATIONS:  There were no immediate complications.   RESULTS:  The baseline ECG demonstrated sinus rhythm with normal axis and  intervals.   BASELINE INTERVALS:  Sinus node cycle length was 864 milliseconds, the HV  interval was 64 milliseconds, the PR interval 165 milliseconds.   RAPID VENTRICULAR PACING:  Rapid ventricular pacing was carried out from the  RV apex demonstrating VA dissociation.   PROGRAMMED ATRIAL STIMULATION:  Programmed atrial stimulation was carried  out from the RV apex demonstrating VA dissociation at 600 milliseconds.   RAPID ATRIAL PACING:  Rapid atrial pacing was carried out from the coronary  sinus in high right atrium, paced cycle length of 600 milliseconds stepwise  decreased down to 410 milliseconds where A-V Wenckebach was observed.  Additional rapid atrial pacing was carried out down to 200 milliseconds  initially, resulting in initiation of atrial flutter.   PROGRAMMED ATRIAL STIMULATION:  Probing stimulation was carried out from the  coronary sinus and the high right atrium base drive cycle length of 045 and  400 milliseconds.  S1, S2 interval stepwise decreased down to 360  milliseconds where A-V node ERP was observed.  During programmed atrial  stimulation there were no AH jumps nor echo beats.  There was no inducible  SVT.   ARRHYTHMIAS OBSERVED.:  1.  Typical atrial flutter initiation was with rapid atrial pacing, duration      was sustained, termination was spontaneous.  2.  Atypical atrial flutter initiation was spontaneous and sometimes      inducible, duration was nonsustained, termination was spontaneous.  3.  Arrhythmia was atrial tachycardia initiation spontaneous, duration      nonsustained, termination spontaneous.  __________ MAPPING:   Mapping of atrial flutter demonstrated typical  counterclockwise tricuspid re-entrant atrial flutter.  Mapping of the  nonsustained flutter demonstrated  no evidence of concealed entrainment on  the lateral right atrium, the lateral left atrium or the os of the coronary  sinus or in the atrial flutter isthmus area.  The actual location of these  nonsustained atrial flutter was not known secondary to its nonsustained  nature.   RADIOFREQUENCY ENERGY APPLICATION:  Total of five RF energy applications  were delivered atrial flutter isthmus resulting in atrial flutter isthmus  block.   CONCLUSION:  Study demonstrates successful electrophysiology study with RF  catheter ablation of typical atrial flutter.  The patient had multiple  spontaneous other arrhythmias, which could not be mapped, and could not be  reproducibly induced.  Antiarrhythmic drug therapy will be tried for these  arrhythmias to prevent additional symptoms.           ______________________________  Doylene Canning. Ladona Ridgel, M.D.  GWT/MEDQ  D:  02/15/2005  T:  02/16/2005  Job:  [id number]   cc:   Willa Rough, M.D.  [address]  [address]  [place]  [place] [address]   Theressa Millard, M.D.  Fax: [phone]

## 2010-06-12 NOTE — Discharge Summary (Signed)
[name], [name]                 ACCOUNT NO.:  [id number]   MEDICAL RECORD NO.:  [id number]          PATIENT TYPE:  INP   LOCATION:  6532                         FACILITY:  [facility]   PHYSICIAN:  Vida Roller, M.D.   DATE OF BIRTH:  11/05/1931   DATE OF ADMISSION:  02/04/2005  DATE OF DISCHARGE:  02/06/2005                                 DISCHARGE SUMMARY   PROCEDURE:  None.   PRIMARY CARDIOLOGIST:  Willa Rough, M.D.   PRINCIPAL DIAGNOSES:  1.  Syncope.      1.  No prodromal features.  2.  Dysrhythmias.      1.  Atrial flutter/atrial tachycardia.   SECONDARY DIAGNOSIS:  1.  History of colonic arteriovenous malformations.      1.  History of heme-positive stool.  2.  Hypertension.  3.  Recent upper respiratory tract infection.   REASON FOR ADMISSION:  [name] is a 77 year old female who was referred  to the emergency room for further evaluation of sudden syncope, absent of  any prodromal features.   HOSPITAL COURSE:  On admission, the patient was found to be atrial  fibrillation with rapid ventricular response and initially was placed on IV  Diltiazem by drip by rate controlled and started on Lovenox. Serial cardiac  markers were negative for ischemia. TSH was normal. Of note the patient was  on Toprol prior to admission and this was continued.   Although Coumadin was initiated, this was subsequently discontinued given  her history of colonic AVMs with heme-positive stool. The determination was  made by Dr. Lewayne Bunting, at time of discharge, to not treat with Coumadin,  but to continue full dose aspirin.   The patient did suffer a fall and referred for head CT scan as well as  shoulder x-ray, which were unremarkable.   The patient was seen in consultation by Dr. Lewayne Bunting on the morning of  discharge who felt that her rhythm represented both atrial flutter as well  as atrial tachycardia. His recommendations were that the patient return for  elective  radiofrequency ablation later next week.   DISCHARGE LABORATORY:  Potassium 4.1, BUN 11, creatinine 0.9. Negative  serial cardiac enzymes. BNP 288. TSH 2.31,  cholesterol 151, triglycerides  215, HDL 35, LDL 73. Liver enzymes normal. INR 0.9 and admission CBC normal.   MEDICATIONS AT DISCHARGE:  1.  Toprol XL 50 mg daily.  2.  Coated aspirin 325 mg daily.   DISCHARGE INSTRUCTIONS:  1.  Arrangements will be made for the patient to return for an elective      radiofrequency ablation procedure by Dr. Lewayne Bunting.  2.  The patient is advised to refrain from driving.  3.  The patient is instructed to not take any OTC cold preparations to avoid      any possible stimulants.   Dictation duration time less than 30 minutes.      Gene Serpe, P.A. [facility]      Vida Roller, M.D.  Electronically Signed    GS/MEDQ  D:  02/06/2005  T:  02/07/2005  Job:  [id number]  cc:   Theressa Millard, M.D.  Fax: [phone]

## 2010-06-12 NOTE — Discharge Summary (Signed)
[facility]  Patient:    [name], [name] Visit Number: [id number] MRN: [id number]          Service Type: SUR Location: 3W [id number] 01 Attending Physician:  Liborio Nixon Dictated by:   Bertram Millard. Dahlstedt, M.D. Admit Date:  11/28/2000 Discharge Date: 11/30/2000                             Discharge Summary  DIAGNOSIS:  Stress urinary incontinence.  PRINCIPAL PROCEDURE:  On 11/28/00, pubovaginal sling.  HISTORY OF PRESENT ILLNESS:  The patient is a 77 year old female with typical stress urinary incontinence.  She presents at this time for pubovaginal sling. She is aware of the risks and complications of this procedure, and desires to proceed.  PAST MEDICAL HISTORY:  Status post hysterectomy and umbilical herniorrhaphy, otherwise unremarkable.  MEDICATIONS: 1. Ogen 0.625 mg q.d. 2. Vitamins. 3. Folic acid. 4. ______ 800 mg q.d. 5. ______ 1200 mg q.d. 6. Enzyme ______  10. 7. Calcium 1200 mg q.d.  ALLERGIES:  CODEINE.  HOSPITAL COURSE:  The patient was admitted to my service and underwent pubovaginal sling with cadaveric fascia lata.  She tolerated the procedure well.  She was discharged home on postoperative day #1.  At that time, she was in stable condition, a regular diet, and she was taught how to undergo voiding trial.  DISCHARGE MEDICATIONS: 1. Vicodin one p.o. q.4h. p.r.n. pain. 2. Keflex 500 mg q.8h. 3. She was also told to restart her regular medications.  ACTIVITY:  Limitations were discussed with her.  CONDITION ON DISCHARGE:  Improved. Dictated by:   Bertram Millard. Dahlstedt, M.D. Attending Physician:  Liborio Nixon DD:  12/20/00 TD:  12/20/00 Job: [id number]

## 2010-06-12 NOTE — Assessment & Plan Note (Signed)
 HEALTHCARE                           ELECTROPHYSIOLOGY OFFICE NOTE   [name], [name]                        MRN:          [id number]  DATE:10/26/2005                            DOB:          10-31-1931    [name] returns today for followup.  She is a very pleasant 77 year old  woman with a history of atrial flutter, status post catheter ablation, who  had several left atrial flutters at the time of her EP study and  nonsustained atrial tach.  After ablation to maintain her in rhythm, we kept  her on amiodarone initially.  I saw her back in April, and she has had no  arrhythmias, and we decreased her amiodarone from 200 daily to 100 daily,  and she returns today for followup.  She has had no symptoms, no  palpitations, no dizzy spells, and has otherwise been stable.   PHYSICAL EXAMINATION:  GENERAL:  She is a pleasant, well-appearing 77-year-  old woman in no distress.  VITAL SIGNS:  Blood pressure 128/82, pulse 53 and regular, respirations 18.  The weight is 148 pounds.  NECK:  No jugular venous distention.  LUNGS:  Clear bilaterally to auscultation.  CARDIOVASCULAR:  Regular bradycardia with normal S1 and S2.  EXTREMITIES:  No edema.   IMPRESSION:  1. Atrial flutter, status post catheter ablation.  2. Sinus bradycardia.  3. Amiodarone therapy.   DISCUSSION:  As [name] is now maintaining sinus rhythm very nicely on  100 mg daily of amiodarone, I have asked that she discontinue this drug, as  she does have relative bradycardia.  She may ultimately require re-  initiation of amiodarone, but for now, I think a period of watchful waiting  would be most reasonable to this extent.  Will plan to have her follow up  with [place] in one year.            ______________________________  Doylene Canning. Ladona Ridgel, MD     GWT/MedQ  DD:  10/26/2005  DT:  10/27/2005  Job #:  [id number]   cc:   Theressa Millard, M.D.

## 2010-06-12 NOTE — Discharge Summary (Signed)
[name], [name]                 ACCOUNT NO.:  [id number]   MEDICAL RECORD NO.:  [id number]          PATIENT TYPE:  OIB   LOCATION:  6524                         FACILITY:  [facility]   PHYSICIAN:  Doylene Canning. Ladona Ridgel, M.D.  DATE OF BIRTH:  02-13-1931   DATE OF ADMISSION:  02/15/2005  DATE OF DISCHARGE:  02/16/2005                                 DISCHARGE SUMMARY   PRINCIPAL DIAGNOSIS:  1.  Discharging day one status post electrophysiology study/radiofrequency      catheter ablation of typical atrial flutter, atrial tachycardia present      after catheter was removed.  2.  History of syncope in the setting of atrial tachycardia/atrial flutter.   SECONDARY DIAGNOSIS:  1.  Hypertension.  2.  History of colonic arteriovenous malformations.  3.  Status post umbilical herniorrhaphy.  4.  Status post hysterectomy.   ALLERGIES:  Bee stings, sulfa, and erythromycin.   PROCEDURE:  February 15, 2005, electrophysiology study/radiofrequency  catheter ablation of atypical atrial flutter, atrial tachycardia, and left  atrial flutter could not be mapped during this procedure.   BRIEF HISTORY:  [name] is a 77 year old female who has a history of  hypertension and colonic AVMs.  She had two syncopal episodes after  admission to the hospital secondary to upper respiratory tract infection.  In the hospital, the patient was found to have multiple arrhythmias  including atrial tachycardia which appears to be a long RP tachycardia as  well as atrial flutter.  She is currently in sinus rhythm.  The patient  gives a history of syncope several weeks ago.  The spells are associated  with no warning and typically lasts for a second or two.  There is no  associated palpitation.  The patient does not feel palpitations when her  heart is demonstrably in atrial arrhythmias.  The patient denies chest pain  or dyspnea with arrhythmias, as well.  One of the first plans and treatment  options would be to provide  anticoagulation with Coumadin.  However, the  patient has a history of arteriovenous malformation with repetitively  positive heme positive stools.  Long term Coumadin is probably not the best  option if can be avoided.  She has recurrent syncope and so she is not to do  any driving currently.  Our recommendation for electrophysiology study and  catheter ablation has been made, the patient plans to do this electively.  We will schedule this catheter ablation over the next several days.  History  was taken February 06, 2005.   HOSPITAL COURSE:  The patient presented January 22 for elective physiology  study/radiofrequency catheter ablation.  The typical atrial flutter was  mapped and ablated by Dr. Lewayne Bunting.  Inducibility of atrial tachycardia  was attempted but could not be provoked.  However, on final withdrawal of  all catheters, the patient did have a short 30 second burst of what appeared  to be an atrial tachycardia.  The patient will be discharged on Amiodarone  and adult 325 mg aspirin with possible re-starting of Coumadin in the future  depending on the  situation with her colonic AVMs.   ADMISSION LABORATORY DATA:  CBC January 22 with white cells 7.5, hemoglobin  13.8, hematocrit 41, platelets 389.  Protime 12.5, INR 0.9.  Serum  electrolytes January 22 with sodium 140, potassium 4.4, chloride 110,  carbonate 26, BUN 15, creatinine 1, glucose 105.   At the time of discharge, the patient is in sinus rhythm, vital signs are  stable.  She apparently is having runs of atrial tachycardia in the 110 to  120 range.   DISCHARGE MEDICATIONS:  1.  Amiodarone 200 mg daily.  2.  Enteric coated aspirin 325 mg daily, this is a new dose up from 81 mg.  3.  Toprol XL 25 mg daily.  4.  Multi-vitamin daily.  5.  Vitamin C daily.  6.  Calcium/vitamin D 600 mg b.i.d.   DISCHARGE INSTRUCTIONS:  She will see Dr. Ladona Ridgel Tuesday, January 30, at 1  o'clock.  Blood work will be taken at that  time, both liver function tests  and TSH, these are for new onset of Amiodarone therapy.  The patient is  asked to continue not driving until she sees Dr. Ladona Ridgel.  She is asked not  to lift anything heavier than 10 pounds for the next two weeks and if she  plans dental work, even just teeth cleaning through July 2007, she is to  call [phone] for antibiotic coverage.      Maple Mirza, P.A.    ______________________________  Doylene Canning. Ladona Ridgel, M.D.    GM/MEDQ  D:  02/16/2005  T:  02/16/2005  Job:  [id number]   cc:   Willa Rough, M.D.  [address]  [address]  [place]  [place] [address]   Theressa Millard, M.D.  Fax: [phone]

## 2010-06-12 NOTE — Consult Note (Signed)
[name], [name]                 ACCOUNT NO.:  [id number]   MEDICAL RECORD NO.:  [id number]          PATIENT TYPE:  INP   LOCATION:  6532                         FACILITY:  [facility]   PHYSICIAN:  Doylene Canning. Ladona Ridgel, M.D.  DATE OF BIRTH:  06/23/1931   DATE OF CONSULTATION:  02/06/2005  DATE OF DISCHARGE:                                   CONSULTATION   REFERRING PHYSICIAN:  Arturo Morton. Riley Kill, M.D. [facility]   INDICATION FOR CONSULTATION:  Evaluation of syncope in the setting of atrial  flutter.   HISTORY OF PRESENT ILLNESS:  The patient is a 77 year old woman with a  history of hypertension and AVMs who was admitted to the hospital after  several days secondary to an upper respiratory tract illness which  subsequently resulted in her having two syncopal episodes. The patient was  admitted to the hospital where she was found to have multiple atrial  arrhythmias including atrial tachycardia which appears to be a long RP  tachycardia as well as atrial flutter which we only have telemetry strips  for.  She is admitted for additional evaluation. She has returned  spontaneously back to sinus rhythm. The patient gives one remote history of  syncope several weeks. These spells are all associated with no warning. They  typically last for a second or two at a time. There is no associated  palpitations. The patient did not have any palpitations with her when her  heart was out of rhythm. She denies dyspnea or chest pain as well.   PAST MEDICAL HISTORY:  As notable for above. There is a history of AVMs.  Otherwise, her health has been quite good.   FAMILY HISTORY:  Negative for premature coronary disease.   SOCIAL HISTORY:  The patient is married. She denies tobacco or ethanol use.   REVIEW OF SYSTEMS:  Negative for vision or hearing problems. Does have upper  respiratory tract type congestion symptoms. She has syncope as previously  noted. She denies chest pain, shortness of breath, nausea,  vomiting,  diarrhea, or constipation. No polyuria, polydipsia, heat or cold  intolerance, recent skin changes, skin problems, arthritic complaints,  neurologic problems. The rest of her review of systems was negative.   PHYSICAL EXAMINATION:  GENERAL: She is a pleasant, well-appearing 77-year-  old woman in no acute distress.  VITAL SIGNS: The blood pressure was 128/60, pulse 70 and regular,  respirations 18, temperature 98.  HEENT: Normocephalic and atraumatic. Pupils equal and round. Oropharynx is  moist. Sclerae anicteric.  NECK: No jugular venous distention. There is no thyromegaly. Trachea  midline. Carotids are 2+ and symmetric.  LUNGS: Clear bilaterally to auscultation with no rales, rhonchi, or wheezes.  There was no increased work of breathing.  CARDIOVASCULAR: Regular rate and rhythm with normal S1 and S2. There were no  murmurs, rubs, or gallops present. The PMI was not laterally displaced.  ABDOMEN: The abdominal exam was soft, nontender, nondistended. There is no  organomegaly present. Bowel sounds present.  EXTREMITIES: Demonstrate on clubbing, cyanosis, or edema. Pulses are 2+ and  symmetric.  NEUROLOGIC: Alert and  oriented times three with cranial nerves intact. The  strength is 5/5 and symmetric.   EKG demonstrates a long RP tachycardia consistent with atrial tachycardia.  There were positive P-waves in lead I and the inferior leads. AVR appeared  to be negative. Telemetry strips demonstrate clear atrial flutter with 3:1  conduction.   IMPRESSION:  1.  Atrial flutter.  2.  Atrial tachycardia.  3.  Syncope almost certainly secondary to #1 and #2, perhaps with #1 and #2      exacerbated by over-the-counter cold preparations with stimulants.   DISCUSSION:  I discussed the treatment options with [name]. One will  medical therapy alone with Coumadin for anticoagulation. Unfortunately  because of the history of arteriovenous malformation causing her to have   repetitively positive heme-positive stools, long-term Coumadin is probably  not the best option if it all, could it all be avoided. With a recurrent  syncope I have asked that she not do any driving. I have recommended that we  proceed with electrophysiology study and catheter ablation. The patient's  birthday is coming up and prefers not to be in the hospital and for this  reason we will plan to allow her to be discharged with followup and schedule  her for catheter ablation the next several days.           ______________________________  Doylene Canning. Ladona Ridgel, M.D.     GWT/MEDQ  D:  02/06/2005  T:  02/07/2005  Job:  [id number]   cc:   Willa Rough, M.D.  [address]  [address]  [place]  [place] [address]

## 2010-06-12 NOTE — Discharge Summary (Signed)
NAMESHAKIYAH, [name]                 ACCOUNT NO.:  [id number]   MEDICAL RECORD NO.:  [id number]          PATIENT TYPE:  OIB   LOCATION:  6524                         FACILITY:  [facility]   PHYSICIAN:  Maple Mirza, P.A. DATE OF BIRTH:  03-11-1931   DATE OF ADMISSION:  02/15/2005  DATE OF DISCHARGE:  02/16/2005                                 DISCHARGE SUMMARY   ADDENDUM:  Medication incorrectly stated Toprol XL 25 mg daily.  She is  actually on Toprol XL 50 mg daily.      Maple Mirza, P.A.     GM/MEDQ  D:  02/16/2005  T:  02/16/2005  Job:  [id number]   cc:   Theressa Millard, M.D.  Fax: [phone]   Doylene Canning. Ladona Ridgel, M.D.  [address]  [address]  [place]  [place] [address]

## 2010-06-12 NOTE — H&P (Signed)
[name], [name] NO.:  [id number]   MEDICAL RECORD NO.:  [id number]          PATIENT TYPE:  EMS   LOCATION:  [facility]                         FACILITY:  [facility]   PHYSICIAN:  Vida Roller, M.D.   DATE OF BIRTH:  [birth year]   DATE OF ADMISSION:  02/04/2005  DATE OF DISCHARGE:                                HISTORY & PHYSICAL   PRIMARY CARE PHYSICIAN:  Dr. Benjaman Kindler.   CARDIOLOGIST:  Willa Rough, M.D.   HISTORY OF PRESENT ILLNESS:  [name] is a 77 year old woman who has a  history of hypertension who for the last three days has had an upper  respiratory infection. She has been quite ill. She has been in bed most of  those days, mostly with a head cold with a sinus discharge and sinus  fullness.  She has had no fever or chills. She has been taking Alka-Seltzer  cold medication with some relief, however this morning, while she was  standing, she reached up to grab something from a shelf and lost  consciousness, fell and hit her head on the left occiput.  She did not have  any prodromal symptoms. There was no bowel or bladder incontinence. She had  no tonic-clonic movements. There was no muscular pain. She awoke almost  immediately after she lost consciousness. She was able to move all of her  extremities and speak.  She did not think a lot of it.  She thought she had  just fainted.  She went about her routine daily activities of living and,  about four hours later, while seated this time, she had an episode of loss  of consciousness while seated, similar symptoms, awoke this time and  presented to her primary doctor's office where he sent her to the emergency  department and we evaluated her there.  She is feeling well now and it turns  out that she has relatively fast heart rate and what looks like atrial  fibrillation with a rapid ventricular response.  Heart rates are in the  130s.   PAST MEDICAL HISTORY:  Her past medical history is significant for  hypertension which appears to be labile. She takes a low dose of beta  blocker for this. She has a history of colonic AVMs, some cervical arthritis  and she is allergic to BEE STINGS.  She also is allergic to SULFA and  ERYTHROMYCIN and these appear to be intolerances due to stomach upset.   PAST SURGICAL HISTORY:  She has had a gallbladder stone about three years  ago under general endotracheal anesthetic without any complications. She has  had an umbilical herniorrhaphy.  She has had a total abdominal hysterectomy.  She has had plastic surgery on her face and she has had some nonmalignant  skin lesions removed.   MEDICATIONS:  1.  Folate 400 mcg once a day.  2.  She takes over-the-counter biotin.  3.  Over-the-counter B12.  4.  Over-the-counter vitamin E.  5.  Over-the-counter coenzyme Q10.  6.  Over-the-counter glucosamine and chondroitin sulfate.  7.  Over-the-counter L-Methionine.  8.  Over-the-counter Lecithin.  9.  Over-the-counter milk thistle.  10. She takes a multivitamin every day.  11. Calcium D 600 mg twice a day.  12. Vitamin C over-the-counter.  13. Aspirin 81 mg.  14. Toprol XL 25 mg once a day.   SOCIAL HISTORY:  She lives in [place] with her husband and she does drink  relatively habitually two vodka drinks a day. She does not smoke but she  used to.  She has about a 25-pack smoking history, quit in 1972. She denies  any illicit drugs. She is retired.   FAMILY HISTORY:  Her mother died of cancer of the mouth.  Her father died of  stomach cancer and had peptic ulcer disease. She has a brother with  laryngeal cancer and a brother who is alive and well, has some asthma.   REVIEW OF SYSTEMS:  She denies any fever or chills, no nausea or vomiting.  No pain or difficulty swallowing. No shortness of breath. No PND or  orthopnea. No lower extremity edema. No chest discomfort. She has no melena,  hematochezia, bright red blood per rectum or any abdominal pain  ulcers,  jaundice or colitis. No lower extremity muscular pain. No neurologic  complaints and remainder of review of systems is negative.   PHYSICAL EXAMINATION:  GENERAL:  She is a well-developed, well-nourished  white female in no apparent distress, alert and oriented x4 and a very good  historian.  VITAL SIGNS:  Her heart rate is about 130.  Her blood pressure is 150/100,  respirations are 14 and she is afebrile.  HEENT:  Examination of the head, ears, eyes, nose and throat unremarkable.  NECK:  Supple. There is no jugular venous distension or carotid bruits.  Do  not palpate a thyroid.  CHEST:  Clear to auscultation.  CARDIOVASCULAR:  Exam is tachycardic without an obvious murmur. Point of  maximal impulse is not displaced.  ABDOMEN:  Soft, nontender. Normoactive bowel sounds.  LOWER EXTREMITIES:  Without significant clubbing, cyanosis or edema.  Her  pulses were all 2+ throughout.   Electrocardiogram:  Have several.  The one from 2002 is essentially normal.  The one from Dr. Newell Coral office shows atrial fibrillation and rapid  ventricular response at a rate of about 130.  There is one also from her  presentation at the ER where she is in atrial fibrillation with intermittent  sinus tachycardia and then we have one where she is in sinus rhythm. She is  currently atrial fibrillation on the monitor.   LABORATORY DATA:  Laboratories were all pending. Head CT is pending.   IMPRESSION:  1.  This is a lady with syncope, two episodes.  It looks like it is in the      setting of atrial fibrillation with rapid ventricular response.  Also in      the setting of taking over-the-counter cold medication which has      phenylephrine in it.  2.  Atrial fibrillation with rapid ventricular response.  3.  Upper respiratory infection.  4.  History of problems with thyroid. 5.  History of colonic AVMs which will be something to watch as we consider      anticoagulation.  6.  Hypertension.  7.   Head trauma due to the fall.   PLAN:  My plan is to start her on diltiazem. Will give her a 5 mg bolus and  then start a drip. She should be anticoagulated with Lovenox with, I  suspect, progression towards  Coumadin.  I will leave that decision to Dr.  Myrtis Ser.  We need an echo only to cycle her enzymes.  Check her electrolytes, her CBC,  her PT/PTT and a TSH. We need to stop her over-the-counter cold medications.  Continue her aspirin and Lopressor.  We should probably get a head CT to  ensure that she has not had significant intracranial trauma from her fall.      Vida Roller, M.D.  Electronically Signed     JH/MEDQ  D:  02/04/2005  T:  02/05/2005  Job:  [id number]

## 2011-02-26 DIAGNOSIS — T6391XA Toxic effect of contact with unspecified venomous animal, accidental (unintentional), initial encounter: Secondary | ICD-10-CM | POA: Diagnosis not present

## 2011-03-01 DIAGNOSIS — L820 Inflamed seborrheic keratosis: Secondary | ICD-10-CM | POA: Diagnosis not present

## 2011-03-01 DIAGNOSIS — D239 Other benign neoplasm of skin, unspecified: Secondary | ICD-10-CM | POA: Diagnosis not present

## 2011-04-07 DIAGNOSIS — R7301 Impaired fasting glucose: Secondary | ICD-10-CM | POA: Diagnosis not present

## 2011-04-07 DIAGNOSIS — R748 Abnormal levels of other serum enzymes: Secondary | ICD-10-CM | POA: Diagnosis not present

## 2011-04-07 DIAGNOSIS — I10 Essential (primary) hypertension: Secondary | ICD-10-CM | POA: Diagnosis not present

## 2011-04-13 DIAGNOSIS — M25569 Pain in unspecified knee: Secondary | ICD-10-CM | POA: Diagnosis not present

## 2011-04-13 DIAGNOSIS — M5126 Other intervertebral disc displacement, lumbar region: Secondary | ICD-10-CM | POA: Diagnosis not present

## 2011-04-13 DIAGNOSIS — M25559 Pain in unspecified hip: Secondary | ICD-10-CM | POA: Diagnosis not present

## 2011-05-03 DIAGNOSIS — T6391XA Toxic effect of contact with unspecified venomous animal, accidental (unintentional), initial encounter: Secondary | ICD-10-CM | POA: Diagnosis not present

## 2011-06-24 DIAGNOSIS — Z961 Presence of intraocular lens: Secondary | ICD-10-CM | POA: Diagnosis not present

## 2011-06-24 DIAGNOSIS — D3130 Benign neoplasm of unspecified choroid: Secondary | ICD-10-CM | POA: Diagnosis not present

## 2011-06-24 DIAGNOSIS — H53039 Strabismic amblyopia, unspecified eye: Secondary | ICD-10-CM | POA: Diagnosis not present

## 2011-06-29 DIAGNOSIS — T6391XA Toxic effect of contact with unspecified venomous animal, accidental (unintentional), initial encounter: Secondary | ICD-10-CM | POA: Diagnosis not present

## 2011-06-30 DIAGNOSIS — T6391XA Toxic effect of contact with unspecified venomous animal, accidental (unintentional), initial encounter: Secondary | ICD-10-CM | POA: Diagnosis not present

## 2011-07-05 DIAGNOSIS — N3020 Other chronic cystitis without hematuria: Secondary | ICD-10-CM | POA: Diagnosis not present

## 2011-07-05 DIAGNOSIS — N3941 Urge incontinence: Secondary | ICD-10-CM | POA: Diagnosis not present

## 2011-08-26 DIAGNOSIS — Z1231 Encounter for screening mammogram for malignant neoplasm of breast: Secondary | ICD-10-CM | POA: Diagnosis not present

## 2011-08-26 DIAGNOSIS — T6391XA Toxic effect of contact with unspecified venomous animal, accidental (unintentional), initial encounter: Secondary | ICD-10-CM | POA: Diagnosis not present

## 2011-09-01 DIAGNOSIS — L819 Disorder of pigmentation, unspecified: Secondary | ICD-10-CM | POA: Diagnosis not present

## 2011-09-01 DIAGNOSIS — L821 Other seborrheic keratosis: Secondary | ICD-10-CM | POA: Diagnosis not present

## 2011-09-01 DIAGNOSIS — D239 Other benign neoplasm of skin, unspecified: Secondary | ICD-10-CM | POA: Diagnosis not present

## 2011-10-04 DIAGNOSIS — I10 Essential (primary) hypertension: Secondary | ICD-10-CM | POA: Diagnosis not present

## 2011-10-04 DIAGNOSIS — Z1331 Encounter for screening for depression: Secondary | ICD-10-CM | POA: Diagnosis not present

## 2011-10-04 DIAGNOSIS — Z0000 Encounter for general adult medical examination without abnormal findings: Secondary | ICD-10-CM | POA: Diagnosis not present

## 2012-04-05 DIAGNOSIS — N390 Urinary tract infection, site not specified: Secondary | ICD-10-CM | POA: Diagnosis not present

## 2012-04-05 DIAGNOSIS — N3020 Other chronic cystitis without hematuria: Secondary | ICD-10-CM | POA: Diagnosis not present

## 2012-04-17 DIAGNOSIS — R350 Frequency of micturition: Secondary | ICD-10-CM | POA: Diagnosis not present

## 2012-04-17 DIAGNOSIS — N3941 Urge incontinence: Secondary | ICD-10-CM | POA: Diagnosis not present

## 2012-04-24 DIAGNOSIS — R350 Frequency of micturition: Secondary | ICD-10-CM | POA: Diagnosis not present

## 2012-04-24 DIAGNOSIS — N3941 Urge incontinence: Secondary | ICD-10-CM | POA: Diagnosis not present

## 2012-04-28 ENCOUNTER — Encounter: Payer: Self-pay | Admitting: Cardiology

## 2012-05-01 DIAGNOSIS — N3941 Urge incontinence: Secondary | ICD-10-CM | POA: Diagnosis not present

## 2012-05-01 DIAGNOSIS — R350 Frequency of micturition: Secondary | ICD-10-CM | POA: Diagnosis not present

## 2012-05-04 DIAGNOSIS — T6391XA Toxic effect of contact with unspecified venomous animal, accidental (unintentional), initial encounter: Secondary | ICD-10-CM | POA: Diagnosis not present

## 2012-05-08 DIAGNOSIS — N3941 Urge incontinence: Secondary | ICD-10-CM | POA: Diagnosis not present

## 2012-05-10 DIAGNOSIS — T6391XA Toxic effect of contact with unspecified venomous animal, accidental (unintentional), initial encounter: Secondary | ICD-10-CM | POA: Diagnosis not present

## 2012-05-15 DIAGNOSIS — R350 Frequency of micturition: Secondary | ICD-10-CM | POA: Diagnosis not present

## 2012-05-15 DIAGNOSIS — N3941 Urge incontinence: Secondary | ICD-10-CM | POA: Diagnosis not present

## 2012-05-17 DIAGNOSIS — T6391XA Toxic effect of contact with unspecified venomous animal, accidental (unintentional), initial encounter: Secondary | ICD-10-CM | POA: Diagnosis not present

## 2012-05-22 ENCOUNTER — Encounter: Payer: Self-pay | Admitting: Cardiology

## 2012-05-22 DIAGNOSIS — N3020 Other chronic cystitis without hematuria: Secondary | ICD-10-CM | POA: Diagnosis not present

## 2012-05-22 DIAGNOSIS — R350 Frequency of micturition: Secondary | ICD-10-CM | POA: Diagnosis not present

## 2012-05-22 DIAGNOSIS — I480 Paroxysmal atrial fibrillation: Secondary | ICD-10-CM | POA: Insufficient documentation

## 2012-05-22 DIAGNOSIS — N3941 Urge incontinence: Secondary | ICD-10-CM | POA: Diagnosis not present

## 2012-05-22 DIAGNOSIS — I4892 Unspecified atrial flutter: Secondary | ICD-10-CM | POA: Insufficient documentation

## 2012-05-22 DIAGNOSIS — I4891 Unspecified atrial fibrillation: Secondary | ICD-10-CM | POA: Insufficient documentation

## 2012-05-24 ENCOUNTER — Ambulatory Visit (INDEPENDENT_AMBULATORY_CARE_PROVIDER_SITE_OTHER): Payer: Medicare Other | Admitting: Cardiology

## 2012-05-24 ENCOUNTER — Encounter: Payer: Self-pay | Admitting: Cardiology

## 2012-05-24 VITALS — BP 137/89 | HR 73 | Ht 61.0 in | Wt 156.4 lb

## 2012-05-24 DIAGNOSIS — R0989 Other specified symptoms and signs involving the circulatory and respiratory systems: Secondary | ICD-10-CM

## 2012-05-24 DIAGNOSIS — R079 Chest pain, unspecified: Secondary | ICD-10-CM

## 2012-05-24 DIAGNOSIS — I4892 Unspecified atrial flutter: Secondary | ICD-10-CM | POA: Diagnosis not present

## 2012-05-24 DIAGNOSIS — R9430 Abnormal result of cardiovascular function study, unspecified: Secondary | ICD-10-CM

## 2012-05-24 DIAGNOSIS — I4891 Unspecified atrial fibrillation: Secondary | ICD-10-CM

## 2012-05-24 DIAGNOSIS — IMO0002 Reserved for concepts with insufficient information to code with codable children: Secondary | ICD-10-CM

## 2012-05-24 NOTE — Assessment & Plan Note (Signed)
The patient has had no return of chest pain since many years ago. No further workup.

## 2012-05-24 NOTE — Assessment & Plan Note (Signed)
The patient underwent atrial flutter ablation in 2007. She's had no recurrence that we know of. In addition she's had no recurrent atrial fibrillation either. Because she has had an ablation in the past I feel it is appropriate to reassess her overall left ventricular function and size and her atrial function and size with a 2-D echo. This will be scheduled.

## 2012-05-24 NOTE — Assessment & Plan Note (Signed)
Her ejection fraction was normal by nuclear study in 2004. We will proceed with a 2-D echo at this time to reassess her overall cardiovascular status. I will be in touch with her with the information.

## 2012-05-24 NOTE — Progress Notes (Signed)
HPI  If the patient is seen today to reestablish her ongoing cardiology care. I take care of her husband, Molly Maduro. I had seen her in the past. She had some chest discomfort in 2004. Nuclear scan revealed low scar or ischemia and an ejection fraction of 70%. Over time she then developed atrial flutter and atrial fibrillation. Her flutter was ablated successfully. She has had no recurrent fibrillation. She had been on amiodarone for a while but this was discontinued in 2008. She has seen Dr. Ladona Ridgel over time about this. He saw her last in 2009. Originally she did not have any palpitations. Most recently she also has not had any palpitations. She is fully active with no significant chest pain.  As part of today's evaluation we have found the old paper chart and I have completely updated the new electronic medical record.  Allergies  Allergen Reactions  . Codeine   . Erythromycin     Current Outpatient Prescriptions  Medication Sig Dispense Refill  . ASPIRIN PO Take by mouth.      Marland Kitchen BIOTIN PO Take by mouth.      Marland Kitchen CALCIUM PO Take by mouth.      . Cholecalciferol (VITAMIN D-3 PO) Take by mouth.      . Coenzyme Q10 (CO Q-10 PO) Take by mouth.      Marland Kitchen GLUCOSAMINE-CHONDROITIN PO Take by mouth.      . metoprolol succinate (TOPROL-XL) 50 MG 24 hr tablet Take 50 mg by mouth daily. Take with or immediately following a meal.      . Multiple Vitamin (MULTIVITAMIN) tablet Take 1 tablet by mouth daily.      . Omega-3 Fatty Acids (FISH OIL PO) Take by mouth.      . trimethoprim (TRIMPEX) 100 MG tablet Take 100 mg by mouth as directed.       No current facility-administered medications for this visit.    History   Social History  . Marital Status: Married    Spouse Name: N/A    Number of Children: N/A  . Years of Education: N/A   Occupational History  . Not on file.   Social History Main Topics  . Smoking status: Former Games developer  . Smokeless tobacco: Not on file  . Alcohol Use: Yes  . Drug  Use: Not on file  . Sexually Active: Not on file   Other Topics Concern  . Not on file   Social History Narrative  . No narrative on file    No family history on file.  Past Medical History  Diagnosis Date  . Atrial fibrillation     Prior treatment with amiodarone, held sinus rhythm and drug stopped  . Atrial flutter     Status post ablation prior to  2009  . Chest pain     Nuclear, 2004, no scar or ischemia, EF 77%  . Ejection fraction     EF 70%, nuclear,  2004  . H/O bladder repair surgery     History reviewed. No pertinent past surgical history.  Patient Active Problem List   Diagnosis Date Noted  . Chest pain   . Ejection fraction   . Atrial fibrillation   . Atrial flutter     ROS   Patient denies fever, chills, headache, sweats, rash, change in vision, change in hearing, chest pain, cough, nausea vomiting, urinary symptoms. All other systems are reviewed and are negative.  PHYSICAL EXAM  Patient is oriented to person time and place. Affect is  normal. There is no jugulovenous distention. Lungs are clear. Respiratory effort is nonlabored. Cardiac exam reveals S1 and S2. There no clicks or significant murmurs. The abdomen is soft. There is no peripheral edema. There no musculoskeletal deformities. There are no skin rashes.  Filed Vitals:   05/24/12 0953  BP: 137/89  Pulse: 73  Height: 5\' 1"  (1.549 m)  Weight: 156 lb 6.4 oz (70.943 kg)   EKG is done today and reviewed by me. There is normal sinus rhythm. There are no significant abnormalities.  ASSESSMENT & PLAN

## 2012-05-24 NOTE — Patient Instructions (Addendum)
Your physician has requested that you have an echocardiogram. Echocardiography is a painless test that uses sound waves to create images of your heart. It provides your doctor with information about the size and shape of your heart and how well your heart's chambers and valves are working. This procedure takes approximately one hour. There are no restrictions for this procedure.  Your physician recommends that you continue on your current medications as directed. Please refer to the Current Medication list given to you today.  Your physician wants you to follow-up in: 2 years. You will receive a reminder letter in the mail two months in advance. If you don't receive a letter, please call our office to schedule the follow-up appointment.

## 2012-05-24 NOTE — Assessment & Plan Note (Addendum)
The patient had atrial fibrillation that we thought may have been set off by her atrial flutter. She was treated with flutter ablation. She was treated with amiodarone for a period of time and this was ultimately stopped. She has not had any clinical symptoms of return of atrial fibrillation. Her rhythm is sinus today by EKG. No further workup needed.  I have carefully reviewed the issue of anticoagulation. I feel that with the treatment that she's had in the past that we should not proceed with any anticoagulation at this time.

## 2012-05-24 NOTE — Addendum Note (Signed)
**Note De-Identified Ilisha Blust Obfuscation** Addended by: Demetrios Loll on: 05/24/2012 10:43 AM   Modules accepted: Orders

## 2012-05-29 DIAGNOSIS — N3941 Urge incontinence: Secondary | ICD-10-CM | POA: Diagnosis not present

## 2012-05-29 DIAGNOSIS — R350 Frequency of micturition: Secondary | ICD-10-CM | POA: Diagnosis not present

## 2012-05-29 DIAGNOSIS — T6391XA Toxic effect of contact with unspecified venomous animal, accidental (unintentional), initial encounter: Secondary | ICD-10-CM | POA: Diagnosis not present

## 2012-05-31 ENCOUNTER — Ambulatory Visit (HOSPITAL_COMMUNITY): Payer: Medicare Other | Attending: Cardiology

## 2012-05-31 DIAGNOSIS — R079 Chest pain, unspecified: Secondary | ICD-10-CM | POA: Insufficient documentation

## 2012-05-31 DIAGNOSIS — Z87891 Personal history of nicotine dependence: Secondary | ICD-10-CM | POA: Diagnosis not present

## 2012-05-31 DIAGNOSIS — I4892 Unspecified atrial flutter: Secondary | ICD-10-CM | POA: Insufficient documentation

## 2012-05-31 NOTE — Progress Notes (Signed)
Echocardiogram performed.  

## 2012-06-04 ENCOUNTER — Encounter: Payer: Self-pay | Admitting: Cardiology

## 2012-06-05 DIAGNOSIS — R350 Frequency of micturition: Secondary | ICD-10-CM | POA: Diagnosis not present

## 2012-06-05 DIAGNOSIS — N3941 Urge incontinence: Secondary | ICD-10-CM | POA: Diagnosis not present

## 2012-06-09 DIAGNOSIS — T6391XA Toxic effect of contact with unspecified venomous animal, accidental (unintentional), initial encounter: Secondary | ICD-10-CM | POA: Diagnosis not present

## 2012-06-12 DIAGNOSIS — R350 Frequency of micturition: Secondary | ICD-10-CM | POA: Diagnosis not present

## 2012-06-12 DIAGNOSIS — N3941 Urge incontinence: Secondary | ICD-10-CM | POA: Diagnosis not present

## 2012-06-13 DIAGNOSIS — T6391XA Toxic effect of contact with unspecified venomous animal, accidental (unintentional), initial encounter: Secondary | ICD-10-CM | POA: Diagnosis not present

## 2012-06-21 DIAGNOSIS — I10 Essential (primary) hypertension: Secondary | ICD-10-CM | POA: Diagnosis not present

## 2012-06-23 DIAGNOSIS — T6391XA Toxic effect of contact with unspecified venomous animal, accidental (unintentional), initial encounter: Secondary | ICD-10-CM | POA: Diagnosis not present

## 2012-06-26 DIAGNOSIS — N3941 Urge incontinence: Secondary | ICD-10-CM | POA: Diagnosis not present

## 2012-06-26 DIAGNOSIS — R350 Frequency of micturition: Secondary | ICD-10-CM | POA: Diagnosis not present

## 2012-06-28 DIAGNOSIS — T6391XA Toxic effect of contact with unspecified venomous animal, accidental (unintentional), initial encounter: Secondary | ICD-10-CM | POA: Diagnosis not present

## 2012-07-05 DIAGNOSIS — T6391XA Toxic effect of contact with unspecified venomous animal, accidental (unintentional), initial encounter: Secondary | ICD-10-CM | POA: Diagnosis not present

## 2012-07-06 DIAGNOSIS — R350 Frequency of micturition: Secondary | ICD-10-CM | POA: Diagnosis not present

## 2012-07-06 DIAGNOSIS — N3941 Urge incontinence: Secondary | ICD-10-CM | POA: Diagnosis not present

## 2012-07-17 DIAGNOSIS — N3941 Urge incontinence: Secondary | ICD-10-CM | POA: Diagnosis not present

## 2012-07-17 DIAGNOSIS — R350 Frequency of micturition: Secondary | ICD-10-CM | POA: Diagnosis not present

## 2012-07-20 DIAGNOSIS — T6391XA Toxic effect of contact with unspecified venomous animal, accidental (unintentional), initial encounter: Secondary | ICD-10-CM | POA: Diagnosis not present

## 2012-08-14 DIAGNOSIS — R350 Frequency of micturition: Secondary | ICD-10-CM | POA: Diagnosis not present

## 2012-08-14 DIAGNOSIS — N3941 Urge incontinence: Secondary | ICD-10-CM | POA: Diagnosis not present

## 2012-08-16 DIAGNOSIS — T6391XA Toxic effect of contact with unspecified venomous animal, accidental (unintentional), initial encounter: Secondary | ICD-10-CM | POA: Diagnosis not present

## 2012-08-28 DIAGNOSIS — Z1231 Encounter for screening mammogram for malignant neoplasm of breast: Secondary | ICD-10-CM | POA: Diagnosis not present

## 2012-08-30 DIAGNOSIS — L570 Actinic keratosis: Secondary | ICD-10-CM | POA: Diagnosis not present

## 2012-08-30 DIAGNOSIS — D1801 Hemangioma of skin and subcutaneous tissue: Secondary | ICD-10-CM | POA: Diagnosis not present

## 2012-08-30 DIAGNOSIS — Z85828 Personal history of other malignant neoplasm of skin: Secondary | ICD-10-CM | POA: Diagnosis not present

## 2012-08-30 DIAGNOSIS — L821 Other seborrheic keratosis: Secondary | ICD-10-CM | POA: Diagnosis not present

## 2012-08-30 DIAGNOSIS — D239 Other benign neoplasm of skin, unspecified: Secondary | ICD-10-CM | POA: Diagnosis not present

## 2012-08-30 DIAGNOSIS — L919 Hypertrophic disorder of the skin, unspecified: Secondary | ICD-10-CM | POA: Diagnosis not present

## 2012-08-30 DIAGNOSIS — L219 Seborrheic dermatitis, unspecified: Secondary | ICD-10-CM | POA: Diagnosis not present

## 2012-08-30 DIAGNOSIS — L909 Atrophic disorder of skin, unspecified: Secondary | ICD-10-CM | POA: Diagnosis not present

## 2012-09-08 ENCOUNTER — Telehealth: Payer: Self-pay | Admitting: Cardiology

## 2012-09-08 NOTE — Telephone Encounter (Signed)
New Prob     [place] the beach and fell and said she lost consciousness. Pt would like to speak to a nurse regarding this. Pt has not yet contacted her PCP.

## 2012-09-08 NOTE — Telephone Encounter (Signed)
**Note De-Identified  Obfuscation** Spoke with the pt and she states that she fell while at the beach earlier this week and lost consciousness . Pt wants to see Dr Myrtis Ser ASAP. Pt is advised that Dr Myrtis Ser is not in the office today and that she needs to contact her PCP, Dr Earl Gala, for evaluation. She verbalized understanding.

## 2012-09-11 DIAGNOSIS — N3941 Urge incontinence: Secondary | ICD-10-CM | POA: Diagnosis not present

## 2012-09-11 DIAGNOSIS — R350 Frequency of micturition: Secondary | ICD-10-CM | POA: Diagnosis not present

## 2012-09-12 DIAGNOSIS — R55 Syncope and collapse: Secondary | ICD-10-CM | POA: Diagnosis not present

## 2012-09-12 DIAGNOSIS — W19XXXA Unspecified fall, initial encounter: Secondary | ICD-10-CM | POA: Diagnosis not present

## 2012-09-15 DIAGNOSIS — T6391XA Toxic effect of contact with unspecified venomous animal, accidental (unintentional), initial encounter: Secondary | ICD-10-CM | POA: Diagnosis not present

## 2012-09-19 ENCOUNTER — Ambulatory Visit (INDEPENDENT_AMBULATORY_CARE_PROVIDER_SITE_OTHER): Payer: Medicare Other | Admitting: Cardiology

## 2012-09-19 ENCOUNTER — Encounter: Payer: Self-pay | Admitting: Cardiology

## 2012-09-19 VITALS — BP 141/68 | HR 87 | Ht 60.0 in | Wt 153.0 lb

## 2012-09-19 DIAGNOSIS — I4892 Unspecified atrial flutter: Secondary | ICD-10-CM

## 2012-09-19 DIAGNOSIS — R002 Palpitations: Secondary | ICD-10-CM

## 2012-09-19 DIAGNOSIS — W19XXXA Unspecified fall, initial encounter: Secondary | ICD-10-CM | POA: Diagnosis not present

## 2012-09-19 DIAGNOSIS — W19XXXD Unspecified fall, subsequent encounter: Secondary | ICD-10-CM

## 2012-09-19 DIAGNOSIS — R55 Syncope and collapse: Secondary | ICD-10-CM | POA: Diagnosis not present

## 2012-09-19 NOTE — Assessment & Plan Note (Signed)
The etiology of the event that occurred is not clear. The patient says she was not dizzy. She definitely remembers falling. The real question is whether or not she truly had apnea on the floor after falling. She recovered rapidly. We know she has normal left ventricular function by a very recent echo. I am not sure that we will ever explain this episode. I do feel that we need to rule out underlying arrhythmias. She will wear an event recorder. She will return to full activities. I will then see her back for followup.  As part of today's evaluation I spent greater than 25 minutes with the patient and her daughter. For that half of this time was with direct contact speaking with them. We had a long discussion about the episode is related to her fall. We discussed all the potential issues.

## 2012-09-19 NOTE — Progress Notes (Signed)
HPI  The patient was seen to followup her history of atrial flutter. This had been ablated. When I saw her in April, 2014 two-dimensional echo was done to be sure that she had continued normal left trigger function. The echo was done in her LV function is good. There were no major valvular abnormalities.  Today the patient reports a new problem. She's here with her daughter. The patient had been at the beach standing on a chair in the kitchen moving some plates. She began to fall. She says she was not dizzy. She remembers falling. Other family members were present. According to the other family members the patient fell all the way to the floor and was awake when she first hit the floor. Then there was a question that she stopped breathing. One family member called 911 immediately. The other family member pumps on her chest approximately 10 times. The patient began to breathe and was fine. She chose not to go to the hospital. There was no seizure activity. There was no urinary incontinence. There was no nausea or vomiting. Since that time the patient has been fine. She's not had any dizziness syncope or presyncope. There's been no chest pain.  Allergies  Allergen Reactions  . Codeine   . Erythromycin     Current Outpatient Prescriptions  Medication Sig Dispense Refill  . aspirin 325 MG tablet Take 325 mg by mouth daily.      Marland Kitchen BIOTIN PO Take 1 tablet by mouth daily.       Marland Kitchen CALCIUM PO Take 1 tablet by mouth daily.       . Cholecalciferol (VITAMIN D-3 PO) Take 1 tablet by mouth daily.       . Coenzyme Q10 (CO Q-10 PO) Take 1 tablet by mouth daily.       . metoprolol succinate (TOPROL-XL) 50 MG 24 hr tablet Take 50 mg by mouth daily. Take with or immediately following a meal.      . Multiple Vitamin (MULTIVITAMIN) tablet Take 1 tablet by mouth daily.      . Omega-3 Fatty Acids (FISH OIL PO) Take 1 tablet by mouth daily.       Marland Kitchen trimethoprim (TRIMPEX) 100 MG tablet Take 100 mg by mouth as  directed.       No current facility-administered medications for this visit.    History   Social History  . Marital Status: Married    Spouse Name: N/A    Number of Children: N/A  . Years of Education: N/A   Occupational History  . Not on file.   Social History Main Topics  . Smoking status: Former Games developer  . Smokeless tobacco: Not on file  . Alcohol Use: Yes  . Drug Use: Not on file  . Sexual Activity: Not on file   Other Topics Concern  . Not on file   Social History Narrative  . No narrative on file    No family history on file.  Past Medical History  Diagnosis Date  . Atrial fibrillation     Prior treatment with amiodarone, held sinus rhythm and drug stopped  . Atrial flutter     Status post ablation prior to  2009  . Chest pain     Nuclear, 2004, no scar or ischemia, EF 77%  . Ejection fraction     EF 70%, nuclear,  2004  //     . H/O bladder repair surgery     History reviewed. No pertinent past surgical  history.  Patient Active Problem List   Diagnosis Date Noted  . Chest pain   . Ejection fraction   . Atrial fibrillation   . Atrial flutter     ROS   Patient denies fever, chills, headache, sweats, rash, change in vision, change in hearing, chest pain, cough, nausea vomiting, urinary symptoms. All other systems are reviewed and are negative.  PHYSICAL EXAM  The patient is here with her daughter who relates a lot of the history. She is oriented to person time and place. Affect is normal. There is no jugulovenous distention. Lungs are clear. Respiratory effort is nonlabored. Cardiac exam reveals S1 and S2. There no clicks or significant murmurs. Abdomen is soft. There is no peripheral edema. There no musculoskeletal deformities. There are no skin rashes.  Filed Vitals:   09/19/12 1538  BP: 141/68  Pulse: 87  Height: 5' (1.524 m)  Weight: 153 lb (69.4 kg)     ASSESSMENT & PLAN

## 2012-09-19 NOTE — Assessment & Plan Note (Signed)
There has been no evidence of recurrent atrial flutter. The patient will wear an event recorder to be sure that we're not seeing any unusual findings such as ventricular arrhythmias or atrial arrhythmias with posttermination pauses.

## 2012-09-19 NOTE — Patient Instructions (Addendum)
Your physician recommends that you continue on your current medications as directed. Please refer to the Current Medication list given to you today.  Your physician has recommended that you wear an event monitor. Event monitors are medical devices that record the heart's electrical activity. Doctors most often [place] these monitors to diagnose arrhythmias. Arrhythmias are problems with the speed or rhythm of the heartbeat. The monitor is a small, portable device. You can wear one while you do your normal daily activities. This is usually used to diagnose what is causing palpitations/syncope (passing out).  Your physician recommends that you schedule a follow-up appointment in: 4 weeks.

## 2012-09-20 ENCOUNTER — Encounter (INDEPENDENT_AMBULATORY_CARE_PROVIDER_SITE_OTHER): Payer: Medicare Other

## 2012-09-20 ENCOUNTER — Encounter: Payer: Self-pay | Admitting: *Deleted

## 2012-09-20 DIAGNOSIS — R55 Syncope and collapse: Secondary | ICD-10-CM

## 2012-09-20 DIAGNOSIS — R002 Palpitations: Secondary | ICD-10-CM

## 2012-09-20 NOTE — Progress Notes (Signed)
Patient ID: SHONITA RINCK, female   DOB: Apr 26, 1931, 79 y.o.   MRN: [id number] E-Cardio verite 21 day cardiac event monitor applied to patient.

## 2012-10-02 DIAGNOSIS — R3915 Urgency of urination: Secondary | ICD-10-CM | POA: Diagnosis not present

## 2012-10-02 DIAGNOSIS — N3941 Urge incontinence: Secondary | ICD-10-CM | POA: Diagnosis not present

## 2012-10-02 DIAGNOSIS — R350 Frequency of micturition: Secondary | ICD-10-CM | POA: Diagnosis not present

## 2012-10-04 DIAGNOSIS — R7989 Other specified abnormal findings of blood chemistry: Secondary | ICD-10-CM | POA: Diagnosis not present

## 2012-10-04 DIAGNOSIS — Z1331 Encounter for screening for depression: Secondary | ICD-10-CM | POA: Diagnosis not present

## 2012-10-04 DIAGNOSIS — Z0000 Encounter for general adult medical examination without abnormal findings: Secondary | ICD-10-CM | POA: Diagnosis not present

## 2012-10-04 DIAGNOSIS — I10 Essential (primary) hypertension: Secondary | ICD-10-CM | POA: Diagnosis not present

## 2012-10-13 DIAGNOSIS — T6391XA Toxic effect of contact with unspecified venomous animal, accidental (unintentional), initial encounter: Secondary | ICD-10-CM | POA: Diagnosis not present

## 2012-10-20 ENCOUNTER — Ambulatory Visit (INDEPENDENT_AMBULATORY_CARE_PROVIDER_SITE_OTHER): Payer: Medicare Other | Admitting: Cardiology

## 2012-10-20 ENCOUNTER — Encounter: Payer: Self-pay | Admitting: Cardiology

## 2012-10-20 VITALS — BP 124/70 | HR 72 | Ht 60.0 in | Wt 155.2 lb

## 2012-10-20 DIAGNOSIS — W19XXXA Unspecified fall, initial encounter: Secondary | ICD-10-CM | POA: Diagnosis not present

## 2012-10-20 DIAGNOSIS — I4891 Unspecified atrial fibrillation: Secondary | ICD-10-CM | POA: Diagnosis not present

## 2012-10-20 DIAGNOSIS — I471 Supraventricular tachycardia: Secondary | ICD-10-CM | POA: Insufficient documentation

## 2012-10-20 DIAGNOSIS — I498 Other specified cardiac arrhythmias: Secondary | ICD-10-CM | POA: Diagnosis not present

## 2012-10-20 DIAGNOSIS — W19XXXD Unspecified fall, subsequent encounter: Secondary | ICD-10-CM

## 2012-10-20 NOTE — Assessment & Plan Note (Signed)
The patient has had no recurring symptoms. Her event recorder reveals no dangerous arrhythmias. There is question of one burst of a regular supraventricular tachycardia with a rate of 155. She is stable. She will continue full activities. I'll see her back in 3 months. I will consider a stress test.

## 2012-10-20 NOTE — Progress Notes (Signed)
HPI  Patient is seen to followup her overall cardiac status. I saw her most recently September 19, 2012. Historically she had atrial flutter ablated. She has normal left ventricular function. Recently she had an episode at the beach. She was getting off of a chair that she had climbed up in her kitchen. As she came down she fell to the floor and her family thought that she was unconscious. However she was aroused immediately and has done well since then. When I assessed her in the office she appeared to be stable. I have not done an exercise test. She has no proven ischemic heart disease. She did wear an event recorder. She had no significant bradycardia arrhythmias. There was no ventricular ectopy area there is a strip gated September 23, 2012 at 04 40 8 PM showing a rate of 155. This is her normal QRS. This could be an episode of reentrant tachycardia. She did not sense this rhythm.  Allergies  Allergen Reactions  . Codeine   . Erythromycin     Current Outpatient Prescriptions  Medication Sig Dispense Refill  . aspirin 325 MG tablet Take 325 mg by mouth daily.      Marland Kitchen BIOTIN PO Take 1 tablet by mouth daily.       Marland Kitchen CALCIUM PO Take 1 tablet by mouth daily.       . Cholecalciferol (VITAMIN D-3 PO) Take 1 tablet by mouth daily.       . Coenzyme Q10 (CO Q-10 PO) Take 1 tablet by mouth daily.       . metoprolol succinate (TOPROL-XL) 50 MG 24 hr tablet Take 50 mg by mouth daily. Take with or immediately following a meal.      . Multiple Vitamin (MULTIVITAMIN) tablet Take 1 tablet by mouth daily.      . Omega-3 Fatty Acids (FISH OIL PO) Take 1 tablet by mouth daily.       Marland Kitchen trimethoprim (TRIMPEX) 100 MG tablet Take 100 mg by mouth as directed.       No current facility-administered medications for this visit.    History   Social History  . Marital Status: Married    Spouse Name: N/A    Number of Children: N/A  . Years of Education: N/A   Occupational History  . Not on file.   Social  History Main Topics  . Smoking status: Former Games developer  . Smokeless tobacco: Not on file  . Alcohol Use: Yes  . Drug Use: Not on file  . Sexual Activity: Not on file   Other Topics Concern  . Not on file   Social History Narrative  . No narrative on file    No family history on file.  Past Medical History  Diagnosis Date  . Atrial fibrillation     Prior treatment with amiodarone, held sinus rhythm and drug stopped  . Atrial flutter     Status post ablation prior to  2009  . Chest pain     Nuclear, 2004, no scar or ischemia, EF 77%  . Ejection fraction     EF 70%, nuclear,  2004  //     . H/O bladder repair surgery   . Fall     July, 2014    History reviewed. No pertinent past surgical history.  Patient Active Problem List   Diagnosis Date Noted  . Fall   . Chest pain   . Ejection fraction   . Atrial fibrillation   . Atrial flutter  ROS   Patient denies fever, chills, headache, sweats, rash, change in vision, change in hearing, chest pain, cough, nausea vomiting, urinary symptoms. All other systems are reviewed and are negative.  PHYSICAL EXAM   Patient's oriented to person time and place. Affect is normal. There is no jugulovenous distention. Lungs are clear. Respiratory effort is nonlabored. Cardiac exam her vitals S1 and S2. There no clicks or significant murmurs. The abdomen is soft. There is no peripheral edema. Filed Vitals:   10/20/12 1056  BP: 124/70  Pulse: 72  Height: 5' (1.524 m)  Weight: 155 lb 3.2 oz (70.398 kg)     ASSESSMENT & PLAN

## 2012-10-20 NOTE — Assessment & Plan Note (Signed)
There is question of some SVT on her event recorder. I will have this assessed further with the company representative. I've chosen not to start medications at this point. However I may consider something that will help with conduction across the AV node. She's not having symptoms.

## 2012-10-20 NOTE — Patient Instructions (Addendum)
Your physician recommends that you continue on your current medications as directed. Please refer to the Current Medication list given to you today.  Your physician recommends that you schedule a follow-up appointment in: 3 months  

## 2012-10-30 DIAGNOSIS — N3941 Urge incontinence: Secondary | ICD-10-CM | POA: Diagnosis not present

## 2012-10-30 DIAGNOSIS — R350 Frequency of micturition: Secondary | ICD-10-CM | POA: Diagnosis not present

## 2012-11-15 DIAGNOSIS — T6391XA Toxic effect of contact with unspecified venomous animal, accidental (unintentional), initial encounter: Secondary | ICD-10-CM | POA: Diagnosis not present

## 2012-11-20 DIAGNOSIS — R350 Frequency of micturition: Secondary | ICD-10-CM | POA: Diagnosis not present

## 2012-11-20 DIAGNOSIS — N3941 Urge incontinence: Secondary | ICD-10-CM | POA: Diagnosis not present

## 2012-12-11 DIAGNOSIS — N3941 Urge incontinence: Secondary | ICD-10-CM | POA: Diagnosis not present

## 2012-12-13 DIAGNOSIS — T6391XA Toxic effect of contact with unspecified venomous animal, accidental (unintentional), initial encounter: Secondary | ICD-10-CM | POA: Diagnosis not present

## 2013-01-01 DIAGNOSIS — R350 Frequency of micturition: Secondary | ICD-10-CM | POA: Diagnosis not present

## 2013-01-01 DIAGNOSIS — N3941 Urge incontinence: Secondary | ICD-10-CM | POA: Diagnosis not present

## 2013-01-08 ENCOUNTER — Encounter: Payer: Self-pay | Admitting: Cardiology

## 2013-01-08 ENCOUNTER — Ambulatory Visit (INDEPENDENT_AMBULATORY_CARE_PROVIDER_SITE_OTHER): Payer: Medicare Other | Admitting: Cardiology

## 2013-01-08 VITALS — BP 130/78 | HR 62 | Ht 61.0 in | Wt 156.0 lb

## 2013-01-08 DIAGNOSIS — W19XXXD Unspecified fall, subsequent encounter: Secondary | ICD-10-CM

## 2013-01-08 DIAGNOSIS — I4891 Unspecified atrial fibrillation: Secondary | ICD-10-CM

## 2013-01-08 DIAGNOSIS — I4892 Unspecified atrial flutter: Secondary | ICD-10-CM | POA: Diagnosis not present

## 2013-01-08 DIAGNOSIS — I471 Supraventricular tachycardia: Secondary | ICD-10-CM

## 2013-01-08 DIAGNOSIS — I498 Other specified cardiac arrhythmias: Secondary | ICD-10-CM

## 2013-01-08 DIAGNOSIS — R079 Chest pain, unspecified: Secondary | ICD-10-CM | POA: Diagnosis not present

## 2013-01-08 DIAGNOSIS — W19XXXA Unspecified fall, initial encounter: Secondary | ICD-10-CM

## 2013-01-08 MED ORDER — ASPIRIN 81 MG PO TABS: 81.0000 mg | ORAL_TABLET | Freq: Every day | ORAL | Status: DC

## 2013-01-08 NOTE — Patient Instructions (Signed)
Your physician wants you to follow-up in:  12 months.  You will receive a reminder letter in the mail two months in advance. If you don't receive a letter, please call our office to schedule the follow-up appointment.  Your physician has recommended you make the following change in your medication: Decrease aspirin to 81 mg by mouth daily   

## 2013-01-08 NOTE — Assessment & Plan Note (Signed)
There has been no proven evidence of recurrent atrial fibrillation.

## 2013-01-08 NOTE — Progress Notes (Signed)
HPI  Patient is seen today to followup an episode with limited tachycardia in August, 2014. The patient fell in her kitchen. There was no documented cardiac event. She wore an event recorder. She had 1 very limited episode with a heart rate of 155. I could not tell how long this lasted. She had no symptoms. The QRS was narrow. This may have been an episode of reentrant tachycardia. She is stable and has had no recurrent problems whatsoever.  Allergies  Allergen Reactions  . Codeine   . Erythromycin     Current Outpatient Prescriptions  Medication Sig Dispense Refill  . aspirin 325 MG tablet Take 325 mg by mouth daily.      Marland Kitchen BIOTIN PO Take 1 tablet by mouth daily.       Marland Kitchen CALCIUM PO Take 1 tablet by mouth daily.       . Cholecalciferol (VITAMIN D-3 PO) Take 1 tablet by mouth daily.       . Coenzyme Q10 (CO Q-10 PO) Take 1 tablet by mouth daily.       . metoprolol succinate (TOPROL-XL) 50 MG 24 hr tablet Take 50 mg by mouth daily. Take with or immediately following a meal.      . Multiple Vitamin (MULTIVITAMIN) tablet Take 1 tablet by mouth daily.      . Omega-3 Fatty Acids (FISH OIL PO) Take 1 tablet by mouth daily.       Marland Kitchen trimethoprim (TRIMPEX) 100 MG tablet Take 100 mg by mouth as directed.       No current facility-administered medications for this visit.    History   Social History  . Marital Status: Married    Spouse Name: N/A    Number of Children: N/A  . Years of Education: N/A   Occupational History  . Not on file.   Social History Main Topics  . Smoking status: Former Games developer  . Smokeless tobacco: Not on file  . Alcohol Use: Yes  . Drug Use: Not on file  . Sexual Activity: Not on file   Other Topics Concern  . Not on file   Social History Narrative  . No narrative on file    No family history on file.  Past Medical History  Diagnosis Date  . Atrial fibrillation     Prior treatment with amiodarone, held sinus rhythm and drug stopped  . Atrial  flutter     Status post ablation prior to  2009  . Chest pain     Nuclear, 2004, no scar or ischemia, EF 77%  . Ejection fraction     EF 70%, nuclear,  2004  //     . H/O bladder repair surgery   . Fall     July, 2014    History reviewed. No pertinent past surgical history.  Patient Active Problem List   Diagnosis Date Noted  . SVT (supraventricular tachycardia) 10/20/2012  . Fall   . Chest pain   . Ejection fraction   . Atrial fibrillation   . Atrial flutter     ROS   Patient denies fever, chills, headache, sweats, rash, change in vision, change in hearing, chest pain, cough, nausea vomiting, urinary symptoms. All other systems are reviewed and are negative.  PHYSICAL EXAM  Patient is oriented to person time and place. Affect is normal. There is no jugulovenous distention. Lungs are clear. Respiratory effort is nonlabored. Cardiac exam reveals S1 and S2. There no clicks or significant murmurs. The abdomen  is soft. Is no peripheral edema.  Filed Vitals:   01/08/13 0930  BP: 130/78  Pulse: 62  Height: 5\' 1"  (1.549 m)  Weight: 156 lb (70.761 kg)     ASSESSMENT & PLAN

## 2013-01-08 NOTE — Assessment & Plan Note (Signed)
She has not had any chest pain. Nuclear scan was done 2004. There is no reason to repeat.

## 2013-01-08 NOTE — Assessment & Plan Note (Signed)
The patient has had no symptoms since her fall in July, 2014. There is no proof that she had a significant cardiac problem. No further workup.

## 2013-01-08 NOTE — Assessment & Plan Note (Signed)
The patient had a short run of supraventricular tachycardia with a rate of 155 on her event recorder in October, 2014. I could not tell how long it lasted. She had no symptoms. There was no proof that was atrial flutter or atrial fib. No further workup is needed at this time. There is no need for anticoagulation. She has been on full dose aspirin historically. We will now cut this down to 81 mg. Over time we will decide if this is to be continued. There is no proven coronary disease.

## 2013-01-08 NOTE — Assessment & Plan Note (Signed)
There has been no proven return of atrial flutter since ablation in 2007.

## 2013-01-10 DIAGNOSIS — T6391XA Toxic effect of contact with unspecified venomous animal, accidental (unintentional), initial encounter: Secondary | ICD-10-CM | POA: Diagnosis not present

## 2013-01-17 DIAGNOSIS — T6391XA Toxic effect of contact with unspecified venomous animal, accidental (unintentional), initial encounter: Secondary | ICD-10-CM | POA: Diagnosis not present

## 2013-01-29 DIAGNOSIS — R350 Frequency of micturition: Secondary | ICD-10-CM | POA: Diagnosis not present

## 2013-01-29 DIAGNOSIS — N3941 Urge incontinence: Secondary | ICD-10-CM | POA: Diagnosis not present

## 2013-02-19 DIAGNOSIS — N3941 Urge incontinence: Secondary | ICD-10-CM | POA: Diagnosis not present

## 2013-02-19 DIAGNOSIS — T6391XA Toxic effect of contact with unspecified venomous animal, accidental (unintentional), initial encounter: Secondary | ICD-10-CM | POA: Diagnosis not present

## 2013-02-19 DIAGNOSIS — R350 Frequency of micturition: Secondary | ICD-10-CM | POA: Diagnosis not present

## 2013-02-21 DIAGNOSIS — L570 Actinic keratosis: Secondary | ICD-10-CM | POA: Diagnosis not present

## 2013-02-21 DIAGNOSIS — L821 Other seborrheic keratosis: Secondary | ICD-10-CM | POA: Diagnosis not present

## 2013-02-21 DIAGNOSIS — Z85828 Personal history of other malignant neoplasm of skin: Secondary | ICD-10-CM | POA: Diagnosis not present

## 2013-03-26 DIAGNOSIS — N3941 Urge incontinence: Secondary | ICD-10-CM | POA: Diagnosis not present

## 2013-03-26 DIAGNOSIS — T6391XA Toxic effect of contact with unspecified venomous animal, accidental (unintentional), initial encounter: Secondary | ICD-10-CM | POA: Diagnosis not present

## 2013-04-04 DIAGNOSIS — I10 Essential (primary) hypertension: Secondary | ICD-10-CM | POA: Diagnosis not present

## 2013-04-16 DIAGNOSIS — N3941 Urge incontinence: Secondary | ICD-10-CM | POA: Diagnosis not present

## 2013-04-16 DIAGNOSIS — R350 Frequency of micturition: Secondary | ICD-10-CM | POA: Diagnosis not present

## 2013-04-19 DIAGNOSIS — D485 Neoplasm of uncertain behavior of skin: Secondary | ICD-10-CM | POA: Diagnosis not present

## 2013-04-19 DIAGNOSIS — Z85828 Personal history of other malignant neoplasm of skin: Secondary | ICD-10-CM | POA: Diagnosis not present

## 2013-04-19 DIAGNOSIS — L820 Inflamed seborrheic keratosis: Secondary | ICD-10-CM | POA: Diagnosis not present

## 2013-04-25 DIAGNOSIS — T6391XA Toxic effect of contact with unspecified venomous animal, accidental (unintentional), initial encounter: Secondary | ICD-10-CM | POA: Diagnosis not present

## 2013-05-21 DIAGNOSIS — N3941 Urge incontinence: Secondary | ICD-10-CM | POA: Diagnosis not present

## 2013-05-21 DIAGNOSIS — R350 Frequency of micturition: Secondary | ICD-10-CM | POA: Diagnosis not present

## 2013-05-25 DIAGNOSIS — T6391XA Toxic effect of contact with unspecified venomous animal, accidental (unintentional), initial encounter: Secondary | ICD-10-CM | POA: Diagnosis not present

## 2013-05-30 DIAGNOSIS — M25569 Pain in unspecified knee: Secondary | ICD-10-CM | POA: Diagnosis not present

## 2013-06-25 DIAGNOSIS — N3941 Urge incontinence: Secondary | ICD-10-CM | POA: Diagnosis not present

## 2013-06-25 DIAGNOSIS — R350 Frequency of micturition: Secondary | ICD-10-CM | POA: Diagnosis not present

## 2013-06-25 DIAGNOSIS — T6391XA Toxic effect of contact with unspecified venomous animal, accidental (unintentional), initial encounter: Secondary | ICD-10-CM | POA: Diagnosis not present

## 2013-06-27 DIAGNOSIS — M25569 Pain in unspecified knee: Secondary | ICD-10-CM | POA: Diagnosis not present

## 2013-07-05 DIAGNOSIS — M25569 Pain in unspecified knee: Secondary | ICD-10-CM | POA: Diagnosis not present

## 2013-07-12 DIAGNOSIS — M25569 Pain in unspecified knee: Secondary | ICD-10-CM | POA: Diagnosis not present

## 2013-07-12 DIAGNOSIS — Z5189 Encounter for other specified aftercare: Secondary | ICD-10-CM | POA: Diagnosis not present

## 2013-07-20 DIAGNOSIS — T6391XA Toxic effect of contact with unspecified venomous animal, accidental (unintentional), initial encounter: Secondary | ICD-10-CM | POA: Diagnosis not present

## 2013-07-30 DIAGNOSIS — N3941 Urge incontinence: Secondary | ICD-10-CM | POA: Diagnosis not present

## 2013-07-30 DIAGNOSIS — T6391XA Toxic effect of contact with unspecified venomous animal, accidental (unintentional), initial encounter: Secondary | ICD-10-CM | POA: Diagnosis not present

## 2013-08-27 DIAGNOSIS — R350 Frequency of micturition: Secondary | ICD-10-CM | POA: Diagnosis not present

## 2013-08-27 DIAGNOSIS — N3941 Urge incontinence: Secondary | ICD-10-CM | POA: Diagnosis not present

## 2013-08-29 DIAGNOSIS — Z1231 Encounter for screening mammogram for malignant neoplasm of breast: Secondary | ICD-10-CM | POA: Diagnosis not present

## 2013-09-11 DIAGNOSIS — H26499 Other secondary cataract, unspecified eye: Secondary | ICD-10-CM | POA: Diagnosis not present

## 2013-09-11 DIAGNOSIS — T6391XA Toxic effect of contact with unspecified venomous animal, accidental (unintentional), initial encounter: Secondary | ICD-10-CM | POA: Diagnosis not present

## 2013-09-11 DIAGNOSIS — H02839 Dermatochalasis of unspecified eye, unspecified eyelid: Secondary | ICD-10-CM | POA: Diagnosis not present

## 2013-09-11 DIAGNOSIS — D3130 Benign neoplasm of unspecified choroid: Secondary | ICD-10-CM | POA: Diagnosis not present

## 2013-09-11 DIAGNOSIS — Z961 Presence of intraocular lens: Secondary | ICD-10-CM | POA: Diagnosis not present

## 2013-10-05 DIAGNOSIS — Z5189 Encounter for other specified aftercare: Secondary | ICD-10-CM | POA: Diagnosis not present

## 2013-10-05 DIAGNOSIS — M25569 Pain in unspecified knee: Secondary | ICD-10-CM | POA: Diagnosis not present

## 2013-10-08 DIAGNOSIS — N3941 Urge incontinence: Secondary | ICD-10-CM | POA: Diagnosis not present

## 2013-10-10 DIAGNOSIS — Z136 Encounter for screening for cardiovascular disorders: Secondary | ICD-10-CM | POA: Diagnosis not present

## 2013-10-10 DIAGNOSIS — Z0000 Encounter for general adult medical examination without abnormal findings: Secondary | ICD-10-CM | POA: Diagnosis not present

## 2013-10-18 DIAGNOSIS — D1801 Hemangioma of skin and subcutaneous tissue: Secondary | ICD-10-CM | POA: Diagnosis not present

## 2013-10-18 DIAGNOSIS — D2360 Other benign neoplasm of skin of unspecified upper limb, including shoulder: Secondary | ICD-10-CM | POA: Diagnosis not present

## 2013-10-18 DIAGNOSIS — Z85828 Personal history of other malignant neoplasm of skin: Secondary | ICD-10-CM | POA: Diagnosis not present

## 2013-10-18 DIAGNOSIS — L821 Other seborrheic keratosis: Secondary | ICD-10-CM | POA: Diagnosis not present

## 2013-10-18 DIAGNOSIS — L918 Other hypertrophic disorders of the skin: Secondary | ICD-10-CM | POA: Diagnosis not present

## 2013-10-18 DIAGNOSIS — D230 Other benign neoplasm of skin of lip: Secondary | ICD-10-CM | POA: Diagnosis not present

## 2013-10-18 DIAGNOSIS — L908 Other atrophic disorders of skin: Secondary | ICD-10-CM | POA: Diagnosis not present

## 2013-10-18 DIAGNOSIS — D235 Other benign neoplasm of skin of trunk: Secondary | ICD-10-CM | POA: Diagnosis not present

## 2013-10-18 DIAGNOSIS — L570 Actinic keratosis: Secondary | ICD-10-CM | POA: Diagnosis not present

## 2013-10-24 DIAGNOSIS — T6391XA Toxic effect of contact with unspecified venomous animal, accidental (unintentional), initial encounter: Secondary | ICD-10-CM | POA: Diagnosis not present

## 2013-11-13 DIAGNOSIS — T63441A Toxic effect of venom of bees, accidental (unintentional), initial encounter: Secondary | ICD-10-CM | POA: Diagnosis not present

## 2013-12-05 DIAGNOSIS — T63441A Toxic effect of venom of bees, accidental (unintentional), initial encounter: Secondary | ICD-10-CM | POA: Diagnosis not present

## 2014-01-08 ENCOUNTER — Encounter: Payer: Self-pay | Admitting: *Deleted

## 2014-01-09 ENCOUNTER — Encounter: Payer: Self-pay | Admitting: Cardiology

## 2014-01-09 ENCOUNTER — Ambulatory Visit (INDEPENDENT_AMBULATORY_CARE_PROVIDER_SITE_OTHER): Payer: Medicare Other | Admitting: Cardiology

## 2014-01-09 VITALS — BP 126/84 | HR 73 | Ht 61.0 in | Wt 149.0 lb

## 2014-01-09 DIAGNOSIS — I471 Supraventricular tachycardia: Secondary | ICD-10-CM

## 2014-01-09 DIAGNOSIS — R072 Precordial pain: Secondary | ICD-10-CM | POA: Diagnosis not present

## 2014-01-09 DIAGNOSIS — R350 Frequency of micturition: Secondary | ICD-10-CM | POA: Diagnosis not present

## 2014-01-09 DIAGNOSIS — R310 Gross hematuria: Secondary | ICD-10-CM | POA: Diagnosis not present

## 2014-01-09 DIAGNOSIS — R300 Dysuria: Secondary | ICD-10-CM | POA: Diagnosis not present

## 2014-01-09 NOTE — Patient Instructions (Signed)
Your physician wants you to follow-up in:  12 months. Dr. Ron Parker will decide who he would like you to see. You will receive a reminder letter in the mail two months in advance. If you don't receive a letter, please call our office to schedule the follow-up appointment.  Your physician recommends that you continue on your current medications as directed. Please refer to the Current Medication list given to you today.

## 2014-01-09 NOTE — Assessment & Plan Note (Signed)
Patient has had no recurrent chest discomfort. No further workup.

## 2014-01-09 NOTE — Progress Notes (Signed)
Patient ID: [name], female   DOB: 1931/06/27, 80 y.o.   MRN: [id number]    HPI Patient is seen for follow-up history of supraventricular arrhythmias area I saw her last December, 2014. She's doing very well. Early in 2004 she had an episode of limited tachycardia. She actually fell in her kitchen. We think that she had a brief episode of reentrant supraventricular tachycardia at that time. Since that time she's been stable.  Allergies  Allergen Reactions  . Codeine   . Erythromycin     Current Outpatient Prescriptions  Medication Sig Dispense Refill  . aspirin 81 MG tablet Take 1 tablet (81 mg total) by mouth daily. 30 tablet 3  . BIOTIN PO Take 1 tablet by mouth daily.     Marland Kitchen CALCIUM PO Take 1 tablet by mouth daily.     . Cholecalciferol (VITAMIN D-3 PO) Take 1 tablet by mouth daily.     . Coenzyme Q10 (CO Q-10 PO) Take 1 tablet by mouth daily.     . metoprolol succinate (TOPROL-XL) 50 MG 24 hr tablet Take 50 mg by mouth daily. Take with or immediately following a meal.    . Multiple Vitamin (MULTIVITAMIN) tablet Take 1 tablet by mouth daily.    . Omega-3 Fatty Acids (FISH OIL PO) Take 1 tablet by mouth daily.     Marland Kitchen trimethoprim (TRIMPEX) 100 MG tablet Take 100 mg by mouth as directed.     No current facility-administered medications for this visit.    History   Social History  . Marital Status: Married    Spouse Name: N/A    Number of Children: N/A  . Years of Education: N/A   Occupational History  . Not on file.   Social History Main Topics  . Smoking status: Former Research scientist (life sciences)  . Smokeless tobacco: Not on file  . Alcohol Use: Yes  . Drug Use: Not on file  . Sexual Activity: Not on file   Other Topics Concern  . Not on file   Social History Narrative    History reviewed. No pertinent family history.  Past Medical History  Diagnosis Date  . Atrial fibrillation     Prior treatment with amiodarone, held sinus rhythm and drug stopped  . Atrial flutter    Status post ablation prior to  2009  . Chest pain     Nuclear, 2004, no scar or ischemia, EF 77%  . Ejection fraction     EF 70%, nuclear,  2004  //     . H/O bladder repair surgery   . Fall     July, 2014    Past Surgical History  Procedure Laterality Date  . No surgical hx      Patient Active Problem List   Diagnosis Date Noted  . SVT (supraventricular tachycardia) 10/20/2012  . Fall   . Chest pain   . Ejection fraction   . Atrial fibrillation   . Atrial flutter     ROS  Patient denies fever, chills, headache, sweats, rash, change in vision, change in hearing, chest pain, cough, nausea or vomiting, urinary symptoms. All other systems are reviewed and are negative.  PHYSICAL EXAM Patient is oriented to person time and place. Affect is normal. Head is atraumatic. Sclera and conjunctiva are normal. There is no jugular venous distention. Lungs are clear. Respiratory effort is nonlabored. Cardiac exam reveals S1 and S2. The abdomen is soft. There is no peripheral edema. There are no musculoskeletal deformities. There are no  skin rashes.  Filed Vitals:   01/09/14 1154  BP: 126/84  Pulse: 73  Height: 5\' 1"  (1.549 m)  Weight: 149 lb (67.586 kg)   EKG is done today and reviewed by me. There is normal sinus rhythm. There is no significant change from the past. ASSESSMENT & PLAN

## 2014-01-09 NOTE — Assessment & Plan Note (Signed)
Patient had an episode of supraventricular tachycardia on event recorder in August, 2014. This was a limited episode. We felt that she needed no further evaluation. She's been quite stable. No further workup.

## 2014-01-16 DIAGNOSIS — T63441A Toxic effect of venom of bees, accidental (unintentional), initial encounter: Secondary | ICD-10-CM | POA: Diagnosis not present

## 2014-01-23 DIAGNOSIS — K5730 Diverticulosis of large intestine without perforation or abscess without bleeding: Secondary | ICD-10-CM | POA: Diagnosis not present

## 2014-01-23 DIAGNOSIS — R310 Gross hematuria: Secondary | ICD-10-CM | POA: Diagnosis not present

## 2014-01-23 DIAGNOSIS — K449 Diaphragmatic hernia without obstruction or gangrene: Secondary | ICD-10-CM | POA: Diagnosis not present

## 2014-02-06 DIAGNOSIS — R310 Gross hematuria: Secondary | ICD-10-CM | POA: Diagnosis not present

## 2014-02-06 DIAGNOSIS — N3020 Other chronic cystitis without hematuria: Secondary | ICD-10-CM | POA: Diagnosis not present

## 2014-03-25 DIAGNOSIS — T63441A Toxic effect of venom of bees, accidental (unintentional), initial encounter: Secondary | ICD-10-CM | POA: Diagnosis not present

## 2014-05-15 DIAGNOSIS — T63441A Toxic effect of venom of bees, accidental (unintentional), initial encounter: Secondary | ICD-10-CM | POA: Diagnosis not present

## 2014-06-25 DIAGNOSIS — T63441A Toxic effect of venom of bees, accidental (unintentional), initial encounter: Secondary | ICD-10-CM | POA: Diagnosis not present

## 2014-06-28 DIAGNOSIS — T63441A Toxic effect of venom of bees, accidental (unintentional), initial encounter: Secondary | ICD-10-CM | POA: Diagnosis not present

## 2014-08-16 DIAGNOSIS — T63441A Toxic effect of venom of bees, accidental (unintentional), initial encounter: Secondary | ICD-10-CM | POA: Diagnosis not present

## 2014-09-10 DIAGNOSIS — Z1231 Encounter for screening mammogram for malignant neoplasm of breast: Secondary | ICD-10-CM | POA: Diagnosis not present

## 2014-10-05 DIAGNOSIS — Z91038 Other insect allergy status: Secondary | ICD-10-CM | POA: Insufficient documentation

## 2014-10-05 DIAGNOSIS — Z91030 Bee allergy status: Secondary | ICD-10-CM | POA: Insufficient documentation

## 2014-10-07 DIAGNOSIS — T63441A Toxic effect of venom of bees, accidental (unintentional), initial encounter: Secondary | ICD-10-CM | POA: Diagnosis not present

## 2014-11-18 ENCOUNTER — Ambulatory Visit (INDEPENDENT_AMBULATORY_CARE_PROVIDER_SITE_OTHER): Payer: Medicare Other

## 2014-11-18 DIAGNOSIS — T63441D Toxic effect of venom of bees, accidental (unintentional), subsequent encounter: Secondary | ICD-10-CM | POA: Diagnosis not present

## 2014-11-20 DIAGNOSIS — D692 Other nonthrombocytopenic purpura: Secondary | ICD-10-CM | POA: Diagnosis not present

## 2014-11-20 DIAGNOSIS — N390 Urinary tract infection, site not specified: Secondary | ICD-10-CM | POA: Diagnosis not present

## 2014-11-20 DIAGNOSIS — Z79899 Other long term (current) drug therapy: Secondary | ICD-10-CM | POA: Diagnosis not present

## 2014-11-20 DIAGNOSIS — Z23 Encounter for immunization: Secondary | ICD-10-CM | POA: Diagnosis not present

## 2014-11-20 DIAGNOSIS — I480 Paroxysmal atrial fibrillation: Secondary | ICD-10-CM | POA: Diagnosis not present

## 2014-11-20 DIAGNOSIS — Z1389 Encounter for screening for other disorder: Secondary | ICD-10-CM | POA: Diagnosis not present

## 2014-11-20 DIAGNOSIS — D7589 Other specified diseases of blood and blood-forming organs: Secondary | ICD-10-CM | POA: Diagnosis not present

## 2014-11-22 DIAGNOSIS — H10021 Other mucopurulent conjunctivitis, right eye: Secondary | ICD-10-CM | POA: Diagnosis not present

## 2014-11-27 DIAGNOSIS — E871 Hypo-osmolality and hyponatremia: Secondary | ICD-10-CM | POA: Diagnosis not present

## 2014-12-06 DIAGNOSIS — D2261 Melanocytic nevi of right upper limb, including shoulder: Secondary | ICD-10-CM | POA: Diagnosis not present

## 2014-12-06 DIAGNOSIS — D235 Other benign neoplasm of skin of trunk: Secondary | ICD-10-CM | POA: Diagnosis not present

## 2014-12-06 DIAGNOSIS — L720 Epidermal cyst: Secondary | ICD-10-CM | POA: Diagnosis not present

## 2014-12-06 DIAGNOSIS — L570 Actinic keratosis: Secondary | ICD-10-CM | POA: Diagnosis not present

## 2014-12-06 DIAGNOSIS — Z85828 Personal history of other malignant neoplasm of skin: Secondary | ICD-10-CM | POA: Diagnosis not present

## 2014-12-06 DIAGNOSIS — L821 Other seborrheic keratosis: Secondary | ICD-10-CM | POA: Diagnosis not present

## 2014-12-06 DIAGNOSIS — D1801 Hemangioma of skin and subcutaneous tissue: Secondary | ICD-10-CM | POA: Diagnosis not present

## 2014-12-06 DIAGNOSIS — L814 Other melanin hyperpigmentation: Secondary | ICD-10-CM | POA: Diagnosis not present

## 2014-12-06 DIAGNOSIS — D485 Neoplasm of uncertain behavior of skin: Secondary | ICD-10-CM | POA: Diagnosis not present

## 2014-12-06 DIAGNOSIS — L918 Other hypertrophic disorders of the skin: Secondary | ICD-10-CM | POA: Diagnosis not present

## 2014-12-06 DIAGNOSIS — D2262 Melanocytic nevi of left upper limb, including shoulder: Secondary | ICD-10-CM | POA: Diagnosis not present

## 2014-12-27 DIAGNOSIS — Z961 Presence of intraocular lens: Secondary | ICD-10-CM | POA: Diagnosis not present

## 2014-12-27 DIAGNOSIS — Z780 Asymptomatic menopausal state: Secondary | ICD-10-CM | POA: Diagnosis not present

## 2014-12-27 DIAGNOSIS — H10413 Chronic giant papillary conjunctivitis, bilateral: Secondary | ICD-10-CM | POA: Diagnosis not present

## 2014-12-27 DIAGNOSIS — H10021 Other mucopurulent conjunctivitis, right eye: Secondary | ICD-10-CM | POA: Diagnosis not present

## 2015-01-02 ENCOUNTER — Ambulatory Visit (INDEPENDENT_AMBULATORY_CARE_PROVIDER_SITE_OTHER): Payer: Medicare Other | Admitting: Cardiology

## 2015-01-02 ENCOUNTER — Encounter: Payer: Self-pay | Admitting: Cardiology

## 2015-01-02 ENCOUNTER — Ambulatory Visit (INDEPENDENT_AMBULATORY_CARE_PROVIDER_SITE_OTHER): Payer: Medicare Other

## 2015-01-02 VITALS — BP 138/70 | HR 66 | Ht 61.0 in | Wt 153.4 lb

## 2015-01-02 DIAGNOSIS — I4892 Unspecified atrial flutter: Secondary | ICD-10-CM | POA: Diagnosis not present

## 2015-01-02 DIAGNOSIS — I4891 Unspecified atrial fibrillation: Secondary | ICD-10-CM | POA: Diagnosis not present

## 2015-01-02 DIAGNOSIS — T63441D Toxic effect of venom of bees, accidental (unintentional), subsequent encounter: Secondary | ICD-10-CM

## 2015-01-02 NOTE — Progress Notes (Signed)
Cardiology Office Note   Date:  01/02/2015   ID:  [name], DOB 1931/07/14, MRN [id number]  PCP:  Jerlyn Ly, MD  Cardiologist:   Candee Furbish, MD   Chief Complaint  Patient presents with  . Follow-up    SVT, former pt dr. Ron Parker      History of Present Illness: [name] is a 81 y.o. female former patient of Dr. Ron Parker here for follow-up of atrial fibrillation, flutter.  In 2007 she underwent flutter ablation by Dr. Lovena Le. In 2004 EF was 70% and nuclear stress test showed no abnormalities.  In 2014 she sustained a fall.  She had an event monitor in 2014 which showed a short burst of SVT, treated conservatively. No further workup.  She very rarely feels palpitations. No shortness of breath, no syncope, no bleeding. She takes low-dose aspirin.  Former patient of Dr. Nancee Liter, now seeing Dr. Crist Infante.  Her husband will be a patient of mine as well.    Past Medical History  Diagnosis Date  . Atrial fibrillation ([place])     Prior treatment with amiodarone, held sinus rhythm and drug stopped  . Atrial flutter ([place])     Status post ablation prior to  2009  . Chest pain     Nuclear, 2004, no scar or ischemia, EF 77%  . Ejection fraction     EF 70%, nuclear,  2004  //     . H/O bladder repair surgery   . Fall     July, 2014    Past Surgical History  Procedure Laterality Date  . No surgical hx       Current Outpatient Prescriptions  Medication Sig Dispense Refill  . aspirin 81 MG tablet Take 1 tablet (81 mg total) by mouth daily. 30 tablet 3  . BIOTIN PO Take 1 tablet by mouth daily.     Marland Kitchen CALCIUM PO Take 1 tablet by mouth daily.     . Coenzyme Q10 (CO Q-10 PO) Take 1 tablet by mouth daily.     . metoprolol succinate (TOPROL-XL) 50 MG 24 hr tablet Take 50 mg by mouth daily. Take with or immediately following a meal.    . Multiple Vitamin (MULTIVITAMIN) tablet Take 1 tablet by mouth daily.    . nitrofurantoin, macrocrystal-monohydrate, (MACROBID) 100  MG capsule Take 100 mg by mouth daily.    . Omega-3 Fatty Acids (FISH OIL PO) Take 1 tablet by mouth daily.     Marland Kitchen trimethoprim (TRIMPEX) 100 MG tablet Take 100 mg by mouth as directed.     No current facility-administered medications for this visit.    Allergies:   Bee venom; Mixed vespid venom; Codeine; Sulfa antibiotics; and Erythromycin    Social History:  The patient  reports that she has quit smoking. She does not have any smokeless tobacco history on file. She reports that she drinks alcohol.   Family History:  The patient's both parents had cancer    ROS:  Please see the history of present illness.   Otherwise, review of systems are positive for none.   All other systems are reviewed and negative.    PHYSICAL EXAM: VS:  BP 138/70 mmHg  Pulse 66  Ht 5\' 1"  (1.549 m)  Wt 153 lb 6.4 oz (69.582 kg)  BMI 29.00 kg/m2  SpO2 99% , BMI Body mass index is 29 kg/(m^2). GEN: Well nourished, well developed, in no acute distress HEENT: normal Neck: no JVD, carotid bruits, or  masses Cardiac: RRR; no murmurs, rubs, or gallops,no edema  Respiratory:  clear to auscultation bilaterally, normal work of breathing GI: soft, nontender, nondistended, + BS MS: no deformity or atrophy Skin: warm and dry, no rash Neuro:  Strength and sensation are intact Psych: euthymic mood, full affect   EKG:  Today 66 NSR - 1st AVB 234 ms personally viewed  Recent Labs: No results found for requested labs within last 365 days.    Lipid Panel No results found for: CHOL, TRIG, HDL, CHOLHDL, VLDL, LDLCALC, LDLDIRECT    Wt Readings from Last 3 Encounters:  01/02/15 153 lb 6.4 oz (69.582 kg)  01/09/14 149 lb (67.586 kg)  01/08/13 156 lb (70.761 kg)      Other studies Reviewed: Additional studies/ records that were reviewed today include: Prior office visit, echo, stress test reviewed. Review of the above records demonstrates: as above   ASSESSMENT AND PLAN:  Supraventricular tachycardia -prior  brief episode noted on event monitor August 2014, limited, no further evaluation by Dr. Ron Parker needed, stable, no further workup at this time. She's not having any chest pain, no shortness of breath, no syncope.  History of atrial flutter -post ablation Dr. Lovena Le   Current medicines are reviewed at length with the patient today.  The patient does not have concerns regarding medicines.  The following changes have been made:  no change  Labs/ tests ordered today include: none  No orders of the defined types were placed in this encounter.     Disposition:   FU with Skains in 1 year  Signed, Candee Furbish, MD  01/02/2015 9:38 AM    [facility] [address] [place], [place], [place]  [address] Phone: [phone]; Fax: [phone]

## 2015-01-02 NOTE — Patient Instructions (Signed)

## 2015-01-08 ENCOUNTER — Ambulatory Visit: Payer: No Typology Code available for payment source | Admitting: Cardiology

## 2015-01-22 ENCOUNTER — Encounter: Payer: Self-pay | Admitting: *Deleted

## 2015-02-04 DIAGNOSIS — Z0000 Encounter for general adult medical examination without abnormal findings: Secondary | ICD-10-CM | POA: Diagnosis not present

## 2015-02-04 DIAGNOSIS — N3020 Other chronic cystitis without hematuria: Secondary | ICD-10-CM | POA: Diagnosis not present

## 2015-02-04 DIAGNOSIS — R350 Frequency of micturition: Secondary | ICD-10-CM | POA: Diagnosis not present

## 2015-02-12 DIAGNOSIS — H35371 Puckering of macula, right eye: Secondary | ICD-10-CM | POA: Diagnosis not present

## 2015-02-12 DIAGNOSIS — H02831 Dermatochalasis of right upper eyelid: Secondary | ICD-10-CM | POA: Diagnosis not present

## 2015-02-12 DIAGNOSIS — Z961 Presence of intraocular lens: Secondary | ICD-10-CM | POA: Diagnosis not present

## 2015-02-12 DIAGNOSIS — D3131 Benign neoplasm of right choroid: Secondary | ICD-10-CM | POA: Diagnosis not present

## 2015-02-12 DIAGNOSIS — H26492 Other secondary cataract, left eye: Secondary | ICD-10-CM | POA: Diagnosis not present

## 2015-02-12 DIAGNOSIS — H02834 Dermatochalasis of left upper eyelid: Secondary | ICD-10-CM | POA: Diagnosis not present

## 2015-02-12 DIAGNOSIS — H3589 Other specified retinal disorders: Secondary | ICD-10-CM | POA: Diagnosis not present

## 2015-02-13 ENCOUNTER — Ambulatory Visit (INDEPENDENT_AMBULATORY_CARE_PROVIDER_SITE_OTHER): Payer: Medicare Other | Admitting: Neurology

## 2015-02-13 DIAGNOSIS — T63441D Toxic effect of venom of bees, accidental (unintentional), subsequent encounter: Secondary | ICD-10-CM

## 2015-04-09 ENCOUNTER — Ambulatory Visit (INDEPENDENT_AMBULATORY_CARE_PROVIDER_SITE_OTHER): Payer: Medicare Other

## 2015-04-09 DIAGNOSIS — T63441D Toxic effect of venom of bees, accidental (unintentional), subsequent encounter: Secondary | ICD-10-CM

## 2015-04-15 DIAGNOSIS — T63441A Toxic effect of venom of bees, accidental (unintentional), initial encounter: Secondary | ICD-10-CM | POA: Diagnosis not present

## 2015-04-18 DIAGNOSIS — D3131 Benign neoplasm of right choroid: Secondary | ICD-10-CM | POA: Diagnosis not present

## 2015-04-18 DIAGNOSIS — H26492 Other secondary cataract, left eye: Secondary | ICD-10-CM | POA: Diagnosis not present

## 2015-04-18 DIAGNOSIS — Z961 Presence of intraocular lens: Secondary | ICD-10-CM | POA: Diagnosis not present

## 2015-04-18 DIAGNOSIS — H353111 Nonexudative age-related macular degeneration, right eye, early dry stage: Secondary | ICD-10-CM | POA: Diagnosis not present

## 2015-04-18 DIAGNOSIS — H35371 Puckering of macula, right eye: Secondary | ICD-10-CM | POA: Diagnosis not present

## 2015-04-18 DIAGNOSIS — H02831 Dermatochalasis of right upper eyelid: Secondary | ICD-10-CM | POA: Diagnosis not present

## 2015-04-18 DIAGNOSIS — H02834 Dermatochalasis of left upper eyelid: Secondary | ICD-10-CM | POA: Diagnosis not present

## 2015-05-08 DIAGNOSIS — H43812 Vitreous degeneration, left eye: Secondary | ICD-10-CM | POA: Diagnosis not present

## 2015-05-08 DIAGNOSIS — Z961 Presence of intraocular lens: Secondary | ICD-10-CM | POA: Diagnosis not present

## 2015-05-08 DIAGNOSIS — H53031 Strabismic amblyopia, right eye: Secondary | ICD-10-CM | POA: Diagnosis not present

## 2015-05-08 DIAGNOSIS — H3411 Central retinal artery occlusion, right eye: Secondary | ICD-10-CM | POA: Diagnosis not present

## 2015-05-12 ENCOUNTER — Other Ambulatory Visit (HOSPITAL_COMMUNITY): Payer: Self-pay | Admitting: Internal Medicine

## 2015-05-12 ENCOUNTER — Ambulatory Visit (HOSPITAL_COMMUNITY)
Admission: RE | Admit: 2015-05-12 | Discharge: 2015-05-12 | Disposition: A | Discharge status: Home / Self Care | Payer: Medicare Other | Source: Ambulatory Visit | Attending: Surgery | Admitting: Surgery

## 2015-05-12 DIAGNOSIS — H539 Unspecified visual disturbance: Secondary | ICD-10-CM | POA: Diagnosis not present

## 2015-05-12 DIAGNOSIS — I480 Paroxysmal atrial fibrillation: Secondary | ICD-10-CM | POA: Diagnosis not present

## 2015-05-12 DIAGNOSIS — Z6829 Body mass index (BMI) 29.0-29.9, adult: Secondary | ICD-10-CM | POA: Diagnosis not present

## 2015-05-12 DIAGNOSIS — E784 Other hyperlipidemia: Secondary | ICD-10-CM | POA: Diagnosis not present

## 2015-05-12 DIAGNOSIS — I6523 Occlusion and stenosis of bilateral carotid arteries: Secondary | ICD-10-CM | POA: Insufficient documentation

## 2015-05-12 DIAGNOSIS — H3411 Central retinal artery occlusion, right eye: Secondary | ICD-10-CM | POA: Diagnosis not present

## 2015-06-05 ENCOUNTER — Ambulatory Visit (INDEPENDENT_AMBULATORY_CARE_PROVIDER_SITE_OTHER): Payer: Medicare Other

## 2015-06-05 DIAGNOSIS — T63441D Toxic effect of venom of bees, accidental (unintentional), subsequent encounter: Secondary | ICD-10-CM

## 2015-07-25 DIAGNOSIS — H903 Sensorineural hearing loss, bilateral: Secondary | ICD-10-CM | POA: Diagnosis not present

## 2015-07-25 DIAGNOSIS — H900 Conductive hearing loss, bilateral: Secondary | ICD-10-CM | POA: Insufficient documentation

## 2015-07-25 DIAGNOSIS — H6123 Impacted cerumen, bilateral: Secondary | ICD-10-CM | POA: Diagnosis not present

## 2015-07-31 DIAGNOSIS — I480 Paroxysmal atrial fibrillation: Secondary | ICD-10-CM | POA: Diagnosis not present

## 2015-07-31 DIAGNOSIS — E784 Other hyperlipidemia: Secondary | ICD-10-CM | POA: Diagnosis not present

## 2015-07-31 DIAGNOSIS — H3411 Central retinal artery occlusion, right eye: Secondary | ICD-10-CM | POA: Diagnosis not present

## 2015-07-31 DIAGNOSIS — Z6829 Body mass index (BMI) 29.0-29.9, adult: Secondary | ICD-10-CM | POA: Diagnosis not present

## 2015-08-01 ENCOUNTER — Ambulatory Visit (INDEPENDENT_AMBULATORY_CARE_PROVIDER_SITE_OTHER): Payer: Medicare Other | Admitting: *Deleted

## 2015-08-01 DIAGNOSIS — T63441D Toxic effect of venom of bees, accidental (unintentional), subsequent encounter: Secondary | ICD-10-CM | POA: Diagnosis not present

## 2015-08-07 DIAGNOSIS — H3411 Central retinal artery occlusion, right eye: Secondary | ICD-10-CM | POA: Diagnosis not present

## 2015-08-07 DIAGNOSIS — H43812 Vitreous degeneration, left eye: Secondary | ICD-10-CM | POA: Diagnosis not present

## 2015-08-07 DIAGNOSIS — H53031 Strabismic amblyopia, right eye: Secondary | ICD-10-CM | POA: Diagnosis not present

## 2015-08-15 DIAGNOSIS — H903 Sensorineural hearing loss, bilateral: Secondary | ICD-10-CM | POA: Diagnosis not present

## 2015-08-15 DIAGNOSIS — H6123 Impacted cerumen, bilateral: Secondary | ICD-10-CM | POA: Diagnosis not present

## 2015-10-02 ENCOUNTER — Ambulatory Visit (INDEPENDENT_AMBULATORY_CARE_PROVIDER_SITE_OTHER): Payer: Medicare Other

## 2015-10-02 DIAGNOSIS — T63441D Toxic effect of venom of bees, accidental (unintentional), subsequent encounter: Secondary | ICD-10-CM

## 2015-11-12 DIAGNOSIS — Z1231 Encounter for screening mammogram for malignant neoplasm of breast: Secondary | ICD-10-CM | POA: Diagnosis not present

## 2015-11-28 ENCOUNTER — Ambulatory Visit (INDEPENDENT_AMBULATORY_CARE_PROVIDER_SITE_OTHER): Payer: Medicare Other

## 2015-11-28 DIAGNOSIS — T63441D Toxic effect of venom of bees, accidental (unintentional), subsequent encounter: Secondary | ICD-10-CM

## 2015-12-09 DIAGNOSIS — R8299 Other abnormal findings in urine: Secondary | ICD-10-CM | POA: Diagnosis not present

## 2015-12-09 DIAGNOSIS — E784 Other hyperlipidemia: Secondary | ICD-10-CM | POA: Diagnosis not present

## 2015-12-09 DIAGNOSIS — D7589 Other specified diseases of blood and blood-forming organs: Secondary | ICD-10-CM | POA: Diagnosis not present

## 2015-12-09 DIAGNOSIS — R358 Other polyuria: Secondary | ICD-10-CM | POA: Diagnosis not present

## 2015-12-16 DIAGNOSIS — Z6829 Body mass index (BMI) 29.0-29.9, adult: Secondary | ICD-10-CM | POA: Diagnosis not present

## 2015-12-16 DIAGNOSIS — D692 Other nonthrombocytopenic purpura: Secondary | ICD-10-CM | POA: Diagnosis not present

## 2015-12-16 DIAGNOSIS — N390 Urinary tract infection, site not specified: Secondary | ICD-10-CM | POA: Diagnosis not present

## 2015-12-16 DIAGNOSIS — E785 Hyperlipidemia, unspecified: Secondary | ICD-10-CM | POA: Diagnosis not present

## 2015-12-16 DIAGNOSIS — Z1389 Encounter for screening for other disorder: Secondary | ICD-10-CM | POA: Diagnosis not present

## 2015-12-16 DIAGNOSIS — I480 Paroxysmal atrial fibrillation: Secondary | ICD-10-CM | POA: Diagnosis not present

## 2015-12-16 DIAGNOSIS — Z0000 Encounter for general adult medical examination without abnormal findings: Secondary | ICD-10-CM | POA: Diagnosis not present

## 2015-12-16 DIAGNOSIS — H3411 Central retinal artery occlusion, right eye: Secondary | ICD-10-CM | POA: Diagnosis not present

## 2015-12-16 DIAGNOSIS — D7589 Other specified diseases of blood and blood-forming organs: Secondary | ICD-10-CM | POA: Diagnosis not present

## 2015-12-29 ENCOUNTER — Encounter: Payer: Self-pay | Admitting: Internal Medicine

## 2016-01-02 ENCOUNTER — Ambulatory Visit (INDEPENDENT_AMBULATORY_CARE_PROVIDER_SITE_OTHER): Payer: Medicare Other | Admitting: Cardiology

## 2016-01-02 ENCOUNTER — Encounter: Payer: Self-pay | Admitting: Cardiology

## 2016-01-02 ENCOUNTER — Encounter (INDEPENDENT_AMBULATORY_CARE_PROVIDER_SITE_OTHER): Payer: Self-pay

## 2016-01-02 VITALS — BP 130/70 | HR 71 | Ht 60.0 in | Wt 153.2 lb

## 2016-01-02 DIAGNOSIS — I440 Atrioventricular block, first degree: Secondary | ICD-10-CM | POA: Diagnosis not present

## 2016-01-02 DIAGNOSIS — I471 Supraventricular tachycardia: Secondary | ICD-10-CM

## 2016-01-02 DIAGNOSIS — I4892 Unspecified atrial flutter: Secondary | ICD-10-CM | POA: Diagnosis not present

## 2016-01-02 NOTE — Patient Instructions (Signed)
Medication Instructions:  The current medical regimen is effective;  continue present plan and medications.  Follow-Up: Follow up in 2 years with Dr. Skains.  You will receive a letter in the mail 2 months before you are due.  Please call us when you receive this letter to schedule your follow up appointment.  If you need a refill on your cardiac medications before your next appointment, please call your pharmacy.  Thank you for choosing [facility]!!     

## 2016-01-02 NOTE — Progress Notes (Signed)
Cardiology Office Note   Date:  01/03/2016   ID:  [name], DOB 01/25/1932, MRN [id number]  PCP:  Jerlyn Ly, MD  Cardiologist:   Candee Furbish, MD   No chief complaint on file.     History of Present Illness: [name] is a 82 y.o. female former patient of Dr. Ron Parker here for follow-up of atrial fibrillation, flutter.  In 2007 she underwent flutter ablation by Dr. Lovena Le. In 2004 EF was 70% and nuclear stress test showed no abnormalities.  In 2014 she sustained a fall. She had an event monitor in 2014 which showed a short burst of SVT, treated conservatively. No further workup.  She very rarely feels palpitations. No shortness of breath, no syncope, no bleeding. She takes low-dose aspirin.  Dr. Crist Infante.  Her husband is a patient of mine as well.  No shortness of breath, no chest pain, no sick be, no orthopnea, no PND.  Past Medical History:  Diagnosis Date  . Atrial fibrillation ([place])    Prior treatment with amiodarone, held sinus rhythm and drug stopped  . Atrial flutter ([place])    Status post ablation prior to  2009  . Chest pain    Nuclear, 2004, no scar or ischemia, EF 77%  . Ejection fraction    EF 70%, nuclear,  2004  //     . Fall    July, 2014  . H/O bladder repair surgery     Past Surgical History:  Procedure Laterality Date  . no surgical hx      Meds: Toprol 50. See list for other non cardiac meds.  Allergies:   Bee venom; Mixed vespid venom; Codeine; Sulfa antibiotics; and Erythromycin    Social History:  The patient  reports that she has quit smoking. She does not have any smokeless tobacco history on file. She reports that she drinks alcohol.   Family History:  The patient's both parents had cancer    ROS:  Please see the history of present illness.   Otherwise, review of systems are positive for none.   All other systems are reviewed and negative.    PHYSICAL EXAM: VS:  BP 130/70   Pulse 71   Ht 5' (1.524 m)   Wt 153 lb  3.2 oz (69.5 kg)   LMP  (LMP Unknown)   BMI 29.92 kg/m  , BMI Body mass index is 29.92 kg/m. GEN: Well nourished, well developed, in no acute distress  HEENT: normal  Neck: no JVD, carotid bruits, or masses Cardiac: RRR; no murmurs, rubs, or gallops,no edema  Respiratory:  clear to auscultation bilaterally, normal work of breathing GI: soft, nontender, nondistended, + BS MS: no deformity or atrophy  Skin: warm and dry, no rash Neuro:  Strength and sensation are intact Psych: euthymic mood, full affect   EKG:  Today 01/02/16-sinus rhythm first degree AV block 71, PR 224 ms. Personally viewed-no significant change from prior. 66 NSR - 1st AVB 234 ms personally viewed  Recent Labs: No results found for requested labs within last 8760 hours.    Lipid Panel No results found for: CHOL, TRIG, HDL, CHOLHDL, VLDL, LDLCALC, LDLDIRECT    Wt Readings from Last 3 Encounters:  01/02/16 153 lb 3.2 oz (69.5 kg)  01/02/15 153 lb 6.4 oz (69.6 kg)  01/09/14 149 lb (67.6 kg)      Other studies Reviewed: Additional studies/ records that were reviewed today include: Prior office visit, echo, stress test reviewed. Review  of the above records demonstrates: as above   ASSESSMENT AND PLAN:  Supraventricular tachycardia -prior brief episode noted on event monitor August 2014, limited, no further evaluation by Dr. Ron Parker needed, stable, no further workup at this time. She's not having any chest pain, no shortness of breath, no syncope.  History of atrial flutter -post ablation Dr. Ashley Mariner 2004. Doing very well. No further recurrences.  First-degree AV block  - Benign at this point. Has been stable. Hopefully will not be of any clinical consonants. I would not adjust metoprolol at this time.  2 year follow-up is reasonable.   Current medicines are reviewed at length with the patient today.  The patient does not have concerns regarding medicines.  The following changes have been made:  no  change  Labs/ tests ordered today include: none   Orders Placed This Encounter  Procedures  . EKG 12-Lead     Disposition:   FU with Yemariam Magar in 1 year  Signed, Candee Furbish, MD  01/03/2016 6:42 AM    [facility] [place], [place], [place]  [address] Phone: [phone]; Fax: [phone]

## 2016-01-29 ENCOUNTER — Ambulatory Visit (INDEPENDENT_AMBULATORY_CARE_PROVIDER_SITE_OTHER): Payer: Medicare Other

## 2016-01-29 DIAGNOSIS — T63441D Toxic effect of venom of bees, accidental (unintentional), subsequent encounter: Secondary | ICD-10-CM

## 2016-02-03 DIAGNOSIS — R300 Dysuria: Secondary | ICD-10-CM | POA: Diagnosis not present

## 2016-02-03 DIAGNOSIS — R310 Gross hematuria: Secondary | ICD-10-CM | POA: Diagnosis not present

## 2016-02-03 DIAGNOSIS — N3020 Other chronic cystitis without hematuria: Secondary | ICD-10-CM | POA: Diagnosis not present

## 2016-02-06 DIAGNOSIS — R310 Gross hematuria: Secondary | ICD-10-CM | POA: Diagnosis not present

## 2016-02-06 DIAGNOSIS — R300 Dysuria: Secondary | ICD-10-CM | POA: Diagnosis not present

## 2016-02-18 DIAGNOSIS — R310 Gross hematuria: Secondary | ICD-10-CM | POA: Diagnosis not present

## 2016-02-26 DIAGNOSIS — N3020 Other chronic cystitis without hematuria: Secondary | ICD-10-CM | POA: Diagnosis not present

## 2016-02-26 DIAGNOSIS — R310 Gross hematuria: Secondary | ICD-10-CM | POA: Diagnosis not present

## 2016-03-31 ENCOUNTER — Ambulatory Visit (INDEPENDENT_AMBULATORY_CARE_PROVIDER_SITE_OTHER): Payer: Medicare Other | Admitting: *Deleted

## 2016-03-31 DIAGNOSIS — T63441D Toxic effect of venom of bees, accidental (unintentional), subsequent encounter: Secondary | ICD-10-CM

## 2016-05-07 DIAGNOSIS — D1801 Hemangioma of skin and subcutaneous tissue: Secondary | ICD-10-CM | POA: Diagnosis not present

## 2016-05-07 DIAGNOSIS — D2262 Melanocytic nevi of left upper limb, including shoulder: Secondary | ICD-10-CM | POA: Diagnosis not present

## 2016-05-07 DIAGNOSIS — D2261 Melanocytic nevi of right upper limb, including shoulder: Secondary | ICD-10-CM | POA: Diagnosis not present

## 2016-05-07 DIAGNOSIS — Z85828 Personal history of other malignant neoplasm of skin: Secondary | ICD-10-CM | POA: Diagnosis not present

## 2016-05-07 DIAGNOSIS — L821 Other seborrheic keratosis: Secondary | ICD-10-CM | POA: Diagnosis not present

## 2016-05-07 DIAGNOSIS — D2222 Melanocytic nevi of left ear and external auricular canal: Secondary | ICD-10-CM | POA: Diagnosis not present

## 2016-05-07 DIAGNOSIS — L814 Other melanin hyperpigmentation: Secondary | ICD-10-CM | POA: Diagnosis not present

## 2016-05-07 DIAGNOSIS — D225 Melanocytic nevi of trunk: Secondary | ICD-10-CM | POA: Diagnosis not present

## 2016-05-26 ENCOUNTER — Ambulatory Visit (INDEPENDENT_AMBULATORY_CARE_PROVIDER_SITE_OTHER): Payer: Medicare Other | Admitting: *Deleted

## 2016-05-26 DIAGNOSIS — T63441D Toxic effect of venom of bees, accidental (unintentional), subsequent encounter: Secondary | ICD-10-CM

## 2016-07-30 ENCOUNTER — Ambulatory Visit (INDEPENDENT_AMBULATORY_CARE_PROVIDER_SITE_OTHER): Payer: Medicare Other

## 2016-07-30 DIAGNOSIS — T63441D Toxic effect of venom of bees, accidental (unintentional), subsequent encounter: Secondary | ICD-10-CM

## 2016-09-24 ENCOUNTER — Ambulatory Visit: Payer: Self-pay

## 2016-10-21 ENCOUNTER — Ambulatory Visit (INDEPENDENT_AMBULATORY_CARE_PROVIDER_SITE_OTHER): Payer: Medicare Other

## 2016-10-21 DIAGNOSIS — T63441D Toxic effect of venom of bees, accidental (unintentional), subsequent encounter: Secondary | ICD-10-CM

## 2016-11-09 DIAGNOSIS — Z23 Encounter for immunization: Secondary | ICD-10-CM | POA: Diagnosis not present

## 2016-11-19 DIAGNOSIS — Z1231 Encounter for screening mammogram for malignant neoplasm of breast: Secondary | ICD-10-CM | POA: Diagnosis not present

## 2016-12-15 ENCOUNTER — Ambulatory Visit (INDEPENDENT_AMBULATORY_CARE_PROVIDER_SITE_OTHER): Payer: Medicare Other

## 2016-12-15 DIAGNOSIS — T63441D Toxic effect of venom of bees, accidental (unintentional), subsequent encounter: Secondary | ICD-10-CM

## 2017-02-09 DIAGNOSIS — R350 Frequency of micturition: Secondary | ICD-10-CM | POA: Diagnosis not present

## 2017-02-09 DIAGNOSIS — N3020 Other chronic cystitis without hematuria: Secondary | ICD-10-CM | POA: Diagnosis not present

## 2017-02-18 ENCOUNTER — Ambulatory Visit (INDEPENDENT_AMBULATORY_CARE_PROVIDER_SITE_OTHER): Payer: Medicare Other

## 2017-02-18 DIAGNOSIS — R05 Cough: Secondary | ICD-10-CM | POA: Diagnosis not present

## 2017-02-18 DIAGNOSIS — Z6829 Body mass index (BMI) 29.0-29.9, adult: Secondary | ICD-10-CM | POA: Diagnosis not present

## 2017-02-18 DIAGNOSIS — T63441D Toxic effect of venom of bees, accidental (unintentional), subsequent encounter: Secondary | ICD-10-CM | POA: Diagnosis not present

## 2017-02-18 DIAGNOSIS — J310 Chronic rhinitis: Secondary | ICD-10-CM | POA: Diagnosis not present

## 2017-03-30 DIAGNOSIS — R7301 Impaired fasting glucose: Secondary | ICD-10-CM | POA: Diagnosis not present

## 2017-03-30 DIAGNOSIS — E7849 Other hyperlipidemia: Secondary | ICD-10-CM | POA: Diagnosis not present

## 2017-03-30 DIAGNOSIS — R82998 Other abnormal findings in urine: Secondary | ICD-10-CM | POA: Diagnosis not present

## 2017-04-06 DIAGNOSIS — I480 Paroxysmal atrial fibrillation: Secondary | ICD-10-CM | POA: Diagnosis not present

## 2017-04-06 DIAGNOSIS — H3411 Central retinal artery occlusion, right eye: Secondary | ICD-10-CM | POA: Diagnosis not present

## 2017-04-06 DIAGNOSIS — E7849 Other hyperlipidemia: Secondary | ICD-10-CM | POA: Diagnosis not present

## 2017-04-06 DIAGNOSIS — Z23 Encounter for immunization: Secondary | ICD-10-CM | POA: Diagnosis not present

## 2017-04-06 DIAGNOSIS — R7301 Impaired fasting glucose: Secondary | ICD-10-CM | POA: Diagnosis not present

## 2017-04-06 DIAGNOSIS — R945 Abnormal results of liver function studies: Secondary | ICD-10-CM | POA: Diagnosis not present

## 2017-04-06 DIAGNOSIS — D692 Other nonthrombocytopenic purpura: Secondary | ICD-10-CM | POA: Diagnosis not present

## 2017-04-06 DIAGNOSIS — N183 Chronic kidney disease, stage 3 (moderate): Secondary | ICD-10-CM | POA: Diagnosis not present

## 2017-04-06 DIAGNOSIS — Z0000 Encounter for general adult medical examination without abnormal findings: Secondary | ICD-10-CM | POA: Diagnosis not present

## 2017-04-06 DIAGNOSIS — Z6829 Body mass index (BMI) 29.0-29.9, adult: Secondary | ICD-10-CM | POA: Diagnosis not present

## 2017-04-06 DIAGNOSIS — N390 Urinary tract infection, site not specified: Secondary | ICD-10-CM | POA: Diagnosis not present

## 2017-04-06 DIAGNOSIS — Z1389 Encounter for screening for other disorder: Secondary | ICD-10-CM | POA: Diagnosis not present

## 2017-04-06 DIAGNOSIS — D7589 Other specified diseases of blood and blood-forming organs: Secondary | ICD-10-CM | POA: Diagnosis not present

## 2017-04-15 ENCOUNTER — Ambulatory Visit: Payer: Self-pay

## 2017-05-25 DIAGNOSIS — Z85828 Personal history of other malignant neoplasm of skin: Secondary | ICD-10-CM | POA: Diagnosis not present

## 2017-05-25 DIAGNOSIS — D225 Melanocytic nevi of trunk: Secondary | ICD-10-CM | POA: Diagnosis not present

## 2017-05-25 DIAGNOSIS — L821 Other seborrheic keratosis: Secondary | ICD-10-CM | POA: Diagnosis not present

## 2017-05-25 DIAGNOSIS — L918 Other hypertrophic disorders of the skin: Secondary | ICD-10-CM | POA: Diagnosis not present

## 2017-05-25 DIAGNOSIS — L814 Other melanin hyperpigmentation: Secondary | ICD-10-CM | POA: Diagnosis not present

## 2017-05-25 DIAGNOSIS — D2262 Melanocytic nevi of left upper limb, including shoulder: Secondary | ICD-10-CM | POA: Diagnosis not present

## 2017-05-25 DIAGNOSIS — D2239 Melanocytic nevi of other parts of face: Secondary | ICD-10-CM | POA: Diagnosis not present

## 2017-05-25 DIAGNOSIS — D1801 Hemangioma of skin and subcutaneous tissue: Secondary | ICD-10-CM | POA: Diagnosis not present

## 2017-05-30 ENCOUNTER — Ambulatory Visit (INDEPENDENT_AMBULATORY_CARE_PROVIDER_SITE_OTHER): Payer: Medicare Other | Admitting: *Deleted

## 2017-05-30 DIAGNOSIS — T63441D Toxic effect of venom of bees, accidental (unintentional), subsequent encounter: Secondary | ICD-10-CM | POA: Diagnosis not present

## 2017-07-25 ENCOUNTER — Ambulatory Visit: Payer: Medicare Other

## 2017-07-26 ENCOUNTER — Ambulatory Visit (INDEPENDENT_AMBULATORY_CARE_PROVIDER_SITE_OTHER): Payer: Medicare Other | Admitting: *Deleted

## 2017-07-26 DIAGNOSIS — T63441D Toxic effect of venom of bees, accidental (unintentional), subsequent encounter: Secondary | ICD-10-CM | POA: Diagnosis not present

## 2017-09-29 ENCOUNTER — Ambulatory Visit (INDEPENDENT_AMBULATORY_CARE_PROVIDER_SITE_OTHER): Payer: Medicare Other | Admitting: *Deleted

## 2017-09-29 DIAGNOSIS — T63441D Toxic effect of venom of bees, accidental (unintentional), subsequent encounter: Secondary | ICD-10-CM | POA: Diagnosis not present

## 2017-11-23 DIAGNOSIS — Z1231 Encounter for screening mammogram for malignant neoplasm of breast: Secondary | ICD-10-CM | POA: Diagnosis not present

## 2017-11-28 ENCOUNTER — Ambulatory Visit (INDEPENDENT_AMBULATORY_CARE_PROVIDER_SITE_OTHER): Payer: Medicare Other

## 2017-11-28 DIAGNOSIS — T63441D Toxic effect of venom of bees, accidental (unintentional), subsequent encounter: Secondary | ICD-10-CM

## 2017-12-05 ENCOUNTER — Encounter: Payer: Self-pay | Admitting: Cardiology

## 2017-12-26 ENCOUNTER — Encounter: Payer: Self-pay | Admitting: Cardiology

## 2017-12-26 ENCOUNTER — Ambulatory Visit (INDEPENDENT_AMBULATORY_CARE_PROVIDER_SITE_OTHER): Payer: Medicare Other | Admitting: Cardiology

## 2017-12-26 VITALS — BP 128/80 | HR 86 | Ht 60.0 in | Wt 153.8 lb

## 2017-12-26 DIAGNOSIS — I440 Atrioventricular block, first degree: Secondary | ICD-10-CM | POA: Diagnosis not present

## 2017-12-26 DIAGNOSIS — I4892 Unspecified atrial flutter: Secondary | ICD-10-CM

## 2017-12-26 DIAGNOSIS — J029 Acute pharyngitis, unspecified: Secondary | ICD-10-CM | POA: Diagnosis not present

## 2017-12-26 DIAGNOSIS — I480 Paroxysmal atrial fibrillation: Secondary | ICD-10-CM | POA: Diagnosis not present

## 2017-12-26 DIAGNOSIS — Z6829 Body mass index (BMI) 29.0-29.9, adult: Secondary | ICD-10-CM | POA: Diagnosis not present

## 2017-12-26 DIAGNOSIS — I471 Supraventricular tachycardia: Secondary | ICD-10-CM | POA: Diagnosis not present

## 2017-12-26 NOTE — Progress Notes (Signed)
Cardiology Office Note:    Date:  12/26/2017   ID:  [name], DOB 26-Jul-1931, MRN [id number]  PCP:  Crist Infante, MD  Cardiologist:  Candee Furbish, MD  Electrophysiologist:  None   Referring MD: Crist Infante, MD     History of Present Illness:    [name] is a 84 y.o. female here for follow-up of atrial fibrillation/flutter.  Prior audiology was Dr. Ron Parker.  Underwent a flutter ablation by Dr. Lovena Le in 2007.  In 2014 she sustained a fall.  Event monitor showed a short burst of SVT treated conservatively.  She rarely ever feels her palpitations.  No chest pain fevers chills nausea vomiting shortness of breath.  I take care of her husband as well.    Past Medical History:  Diagnosis Date  . Atrial fibrillation ([place])    Prior treatment with amiodarone, held sinus rhythm and drug stopped  . Atrial flutter ([place])    Status post ablation prior to  2009  . Chest pain    Nuclear, 2004, no scar or ischemia, EF 77%  . Ejection fraction    EF 70%, nuclear,  2004  //     . Fall    July, 2014  . H/O bladder repair surgery     Past Surgical History:  Procedure Laterality Date  . no surgical hx      Current Medications: Current Meds  Medication Sig  . aspirin 81 MG tablet Take 1 tablet (81 mg total) by mouth daily.  Marland Kitchen BIOTIN PO Take 1 tablet by mouth daily.   Marland Kitchen CALCIUM PO Take 1 tablet by mouth daily.   . Coenzyme Q10 (CO Q-10 PO) Take 1 tablet by mouth daily.   . Investigational - Study Medication Additional Study Details  . metoprolol succinate (TOPROL-XL) 50 MG 24 hr tablet Take 50 mg by mouth daily. Take with or immediately following a meal.  . trimethoprim (TRIMPEX) 100 MG tablet Take 100 mg by mouth as directed.     Allergies:   Bee venom; Mixed vespid venom; Codeine; Sulfa antibiotics; and Erythromycin   Social History   Socioeconomic History  . Marital status: Married    Spouse name: Not on file  . Number of children: Not on file  . Years of education: Not  on file  . Highest education level: Not on file  Occupational History  . Not on file  Social Needs  . Financial resource strain: Not on file  . Food insecurity:    Worry: Not on file    Inability: Not on file  . Transportation needs:    Medical: Not on file    Non-medical: Not on file  Tobacco Use  . Smoking status: Former Research scientist (life sciences)  . Smokeless tobacco: Never Used  Substance and Sexual Activity  . Alcohol use: Yes  . Drug use: Never  . Sexual activity: Not on file  Lifestyle  . Physical activity:    Days per week: Not on file    Minutes per session: Not on file  . Stress: Not on file  Relationships  . Social connections:    Talks on phone: Not on file    Gets together: Not on file    Attends religious service: Not on file    Active member of club or organization: Not on file    Attends meetings of clubs or organizations: Not on file    Relationship status: Not on file  Other Topics Concern  . Not on file  Social History Narrative  . Not on file     Family History: The patient's family history is not on file.  No early family history of CAD  ROS:   Please see the history of present illness.     All other systems reviewed and are negative.  EKGs/Labs/Other Studies Reviewed:    The following studies were reviewed today: Prior office notes EKG lab work  EKG:  EKG is  ordered today.  The ekg ordered today demonstrates sinus rhythm first-degree AV block heart rate 86 bpm PR interval 230 ms  Recent Labs: No results found for requested labs within last 8760 hours.  Recent Lipid Panel No results found for: CHOL, TRIG, HDL, CHOLHDL, VLDL, LDLCALC, LDLDIRECT  Physical Exam:    VS:  BP 128/80   Pulse 86   Ht 5' (1.524 m)   Wt 153 lb 12.8 oz (69.8 kg)   LMP  (LMP Unknown)   BMI 30.04 kg/m     Wt Readings from Last 3 Encounters:  12/26/17 153 lb 12.8 oz (69.8 kg)  01/02/16 153 lb 3.2 oz (69.5 kg)  01/02/15 153 lb 6.4 oz (69.6 kg)     GEN:  Well nourished, well  developed in no acute distress HEENT: Normal NECK: No JVD; No carotid bruits LYMPHATICS: No lymphadenopathy CARDIAC: RRR, no murmurs, rubs, gallops RESPIRATORY:  Clear to auscultation without rales, wheezing or rhonchi  ABDOMEN: Soft, non-tender, non-distended MUSCULOSKELETAL:  No edema; No deformity  SKIN: Warm and dry NEUROLOGIC:  Alert and oriented x 3 PSYCHIATRIC:  Normal affect   ASSESSMENT:    1. Atrial flutter, unspecified type ([place])   2. SVT (supraventricular tachycardia) ([place])   3. First degree AV block    PLAN:    In order of problems listed above:  History of atrial flutter -Post ablation Dr. Lovena Le 2004.  No further recurrence.  Supraventricular tachycardia - Brief episode noted in 2014 on event monitor.  Stable.  No further work-up necessary.  First-degree AV block - Benign.  Continue with metoprolol. 2-year follow-up once again.  Carotid artery plaque -Less than 40% bilateral.  Study from 2017, V VS.  Prior LDL 43.  Excellent.  We will see her back in 2 years  Medication Adjustments/Labs and Tests Ordered: Current medicines are reviewed at length with the patient today.  Concerns regarding medicines are outlined above.  Orders Placed This Encounter  Procedures  . EKG 12-Lead   No orders of the defined types were placed in this encounter.   Patient Instructions  Medication Instructions:  The current medical regimen is effective;  continue present plan and medications.  If you need a refill on your cardiac medications before your next appointment, please call your pharmacy.   Follow-Up: At [facility], you and your health needs are our priority.  As part of our continuing mission to provide you with exceptional heart care, we have created designated Provider Care Teams.  These Care Teams include your primary Cardiologist (physician) and Advanced Practice Providers (APPs -  Physician Assistants and Nurse Practitioners) who all work together to  provide you with the care you need, when you need it. You will need a follow up appointment in 2 years.  Please call our office 2 months in advance to schedule this appointment.  You may see Dr. Candee Furbish. or one of the following Advanced Practice Providers on your designated Care Team:   Truitt Merle, NP Cecilie Kicks, NP . Kathyrn Drown, NP  Thank you for  choosing [facility]!!         Signed, Candee Furbish, MD  12/26/2017 12:23 PM    [place]

## 2017-12-26 NOTE — Patient Instructions (Signed)
Medication Instructions:  The current medical regimen is effective;  continue present plan and medications.  If you need a refill on your cardiac medications before your next appointment, please call your pharmacy.   Follow-Up: At [facility], you and your health needs are our priority.  As part of our continuing mission to provide you with exceptional heart care, we have created designated Provider Care Teams.  These Care Teams include your primary Cardiologist (physician) and Advanced Practice Providers (APPs -  Physician Assistants and Nurse Practitioners) who all work together to provide you with the care you need, when you need it. You will need a follow up appointment in 2 years.  Please call our office 2 months in advance to schedule this appointment.  You may see Dr Mark Skains or one of the following Advanced Practice Providers on your designated Care Team:   Lori Gerhardt, NP Laura Ingold, NP . Jill McDaniel, NP  Thank you for choosing [facility]!!      

## 2018-01-23 ENCOUNTER — Ambulatory Visit: Payer: Self-pay

## 2018-02-02 ENCOUNTER — Ambulatory Visit (INDEPENDENT_AMBULATORY_CARE_PROVIDER_SITE_OTHER): Payer: Medicare Other | Admitting: *Deleted

## 2018-02-02 DIAGNOSIS — T63441D Toxic effect of venom of bees, accidental (unintentional), subsequent encounter: Secondary | ICD-10-CM

## 2018-03-30 ENCOUNTER — Ambulatory Visit: Payer: Medicare Other

## 2018-05-31 DIAGNOSIS — N3020 Other chronic cystitis without hematuria: Secondary | ICD-10-CM | POA: Diagnosis not present

## 2018-05-31 DIAGNOSIS — R310 Gross hematuria: Secondary | ICD-10-CM | POA: Diagnosis not present

## 2018-05-31 DIAGNOSIS — R350 Frequency of micturition: Secondary | ICD-10-CM | POA: Diagnosis not present

## 2018-07-17 DIAGNOSIS — I480 Paroxysmal atrial fibrillation: Secondary | ICD-10-CM | POA: Diagnosis not present

## 2018-07-17 DIAGNOSIS — M62830 Muscle spasm of back: Secondary | ICD-10-CM | POA: Diagnosis not present

## 2018-07-27 DIAGNOSIS — R7301 Impaired fasting glucose: Secondary | ICD-10-CM | POA: Diagnosis not present

## 2018-07-27 DIAGNOSIS — E7849 Other hyperlipidemia: Secondary | ICD-10-CM | POA: Diagnosis not present

## 2018-08-02 DIAGNOSIS — D692 Other nonthrombocytopenic purpura: Secondary | ICD-10-CM | POA: Diagnosis not present

## 2018-08-02 DIAGNOSIS — N390 Urinary tract infection, site not specified: Secondary | ICD-10-CM | POA: Diagnosis not present

## 2018-08-02 DIAGNOSIS — I480 Paroxysmal atrial fibrillation: Secondary | ICD-10-CM | POA: Diagnosis not present

## 2018-08-02 DIAGNOSIS — R413 Other amnesia: Secondary | ICD-10-CM | POA: Diagnosis not present

## 2018-08-02 DIAGNOSIS — E871 Hypo-osmolality and hyponatremia: Secondary | ICD-10-CM | POA: Diagnosis not present

## 2018-08-02 DIAGNOSIS — R7301 Impaired fasting glucose: Secondary | ICD-10-CM | POA: Diagnosis not present

## 2018-08-02 DIAGNOSIS — H3411 Central retinal artery occlusion, right eye: Secondary | ICD-10-CM | POA: Diagnosis not present

## 2018-08-02 DIAGNOSIS — R945 Abnormal results of liver function studies: Secondary | ICD-10-CM | POA: Diagnosis not present

## 2018-08-02 DIAGNOSIS — E785 Hyperlipidemia, unspecified: Secondary | ICD-10-CM | POA: Diagnosis not present

## 2018-08-02 DIAGNOSIS — Z0000 Encounter for general adult medical examination without abnormal findings: Secondary | ICD-10-CM | POA: Diagnosis not present

## 2018-08-02 DIAGNOSIS — R82998 Other abnormal findings in urine: Secondary | ICD-10-CM | POA: Diagnosis not present

## 2018-08-02 DIAGNOSIS — N183 Chronic kidney disease, stage 3 (moderate): Secondary | ICD-10-CM | POA: Diagnosis not present

## 2018-08-02 DIAGNOSIS — D7589 Other specified diseases of blood and blood-forming organs: Secondary | ICD-10-CM | POA: Diagnosis not present

## 2018-08-07 DIAGNOSIS — Z1331 Encounter for screening for depression: Secondary | ICD-10-CM | POA: Diagnosis not present

## 2018-08-07 DIAGNOSIS — Z1339 Encounter for screening examination for other mental health and behavioral disorders: Secondary | ICD-10-CM | POA: Diagnosis not present

## 2018-08-08 ENCOUNTER — Other Ambulatory Visit: Payer: Self-pay

## 2018-08-08 ENCOUNTER — Ambulatory Visit (INDEPENDENT_AMBULATORY_CARE_PROVIDER_SITE_OTHER): Payer: Medicare Other | Admitting: Allergy and Immunology

## 2018-08-08 DIAGNOSIS — T63441D Toxic effect of venom of bees, accidental (unintentional), subsequent encounter: Secondary | ICD-10-CM | POA: Diagnosis not present

## 2018-08-08 NOTE — Patient Instructions (Signed)
  1.  Continue hymenoptera immunotherapy every 8 weeks  2.  Continue EpiPen if needed  3.  Return to clinic every year while using immunotherapy  4.  Obtain fall flu vaccine (and COVID vaccine)

## 2018-08-08 NOTE — Progress Notes (Signed)
[place]   Follow-up Note  Referring Provider: Crist Infante, MD Primary Provider: Crist Infante, MD Date of Office Visit: 08/08/2018  Subjective:   [name] (DOB: 05-26-1931) is a 85 y.o. female who returns to the [facility] on 08/08/2018 in re-evaluation of the following:  HPI: [name] presents to this clinic in evaluation of hymenoptera venom hypersensitivity state.  She has not been seen in this clinic in years.  [name] is using immunotherapy directed against hymenoptera venom without any adverse effect.  Currently she is using this form of therapy every 8 weeks.  Her initial reaction was manifested as respiratory tract difficulty with inability to breathe, tongue swelling, and hives.  She has not been stung in the field by a hymenoptera.  She does have a injectable epinephrine device.  Allergies as of 08/08/2018      Reactions   Bee Venom Anaphylaxis   Mixed Vespid Venom Anaphylaxis   Codeine Other (See Comments)   Not known   Sulfa Antibiotics Itching, Nausea Only   Erythromycin Nausea And Vomiting      Medication List      aspirin 81 MG tablet Take 1 tablet (81 mg total) by mouth daily.   atorvastatin 10 MG tablet Commonly known as: LIPITOR Take 10 mg by mouth daily.   BIOTIN PO Take 1 tablet by mouth daily.   CALCIUM PO Take 1 tablet by mouth daily.   CO Q-10 PO Take 1 tablet by mouth daily.   EPINEPHrine 0.3 mg/0.3 mL Soaj injection Commonly known as: EPI-PEN INJECT INTRAMUSCULARLY AS DIRECTED FOR ANAPHYLAXIS   Investigational - Study Medication Additional Study Details   metoprolol succinate 50 MG 24 hr tablet Commonly known as: TOPROL-XL Take 50 mg by mouth daily. Take with or immediately following a meal.   trimethoprim 100 MG tablet Commonly known as: TRIMPEX Take 100 mg by mouth as directed.       Past Medical History:  Diagnosis Date  . Atrial fibrillation ([place])    Prior treatment with amiodarone, held sinus rhythm and drug stopped  . Atrial flutter ([place])    Status post ablation prior to  2009  . Chest pain    Nuclear, 2004, no scar or ischemia, EF 77%  . Ejection fraction    EF 70%, nuclear,  2004  //     . Fall    July, 2014  . H/O bladder repair surgery     Past Surgical History:  Procedure Laterality Date  . no surgical hx      Review of systems negative except as noted in HPI / PMHx or noted below:  Review of Systems  Constitutional: Negative.   HENT: Negative.   Eyes: Negative.   Respiratory: Negative.   Cardiovascular: Negative.   Gastrointestinal: Negative.   Genitourinary: Negative.   Musculoskeletal: Negative.   Skin: Negative.   Neurological: Negative.   Endo/Heme/Allergies: Negative.   Psychiatric/Behavioral: Negative.      Objective:   There were no vitals filed for this visit.        Physical Exam Constitutional:      Appearance: She is not diaphoretic.  HENT:     Head: Normocephalic.     Right Ear: Tympanic membrane, ear canal and external ear normal.     Left Ear: Tympanic membrane, ear canal and external ear normal.     Nose: Nose normal. No mucosal edema or rhinorrhea.  Mouth/Throat:     Pharynx: Uvula midline. No oropharyngeal exudate.  Eyes:     Conjunctiva/sclera: Conjunctivae normal.  Neck:     Thyroid: No thyromegaly.     Trachea: Trachea normal. No tracheal tenderness or tracheal deviation.  Cardiovascular:     Rate and Rhythm: Normal rate and regular rhythm.     Heart sounds: Normal heart sounds, S1 normal and S2 normal. No murmur.  Pulmonary:     Effort: No respiratory distress.     Breath sounds: Normal breath sounds. No stridor. No wheezing or rales.  Lymphadenopathy:     Head:     Right side of head: No tonsillar adenopathy.     Left side of head: No tonsillar adenopathy.     Cervical: No cervical adenopathy.  Skin:    Findings: No erythema or rash.     Nails: There is no  clubbing.   Neurological:     Mental Status: She is alert.     Diagnostics: none  Assessment and Plan:   1. Toxic effect of venom of bees, unintentional, subsequent encounter     1.  Continue hymenoptera immunotherapy every 8 weeks  2.  Continue EpiPen if needed  3.  Return to clinic every year while using immunotherapy  4.  Obtain fall flu vaccine (and COVID vaccine)  [name] will continue to utilize immunotherapy at this point in time.  Her initial hymenoptera reaction was quite severe and I did have a talk with her today about her risk of developing hymenoptera hypersensitivity if she discontinues her immunotherapy.  Her risk would definitely be above the general population risk given her initial reactivity.  She has elected to remain on hymenoptera venom immunotherapy every 8 weeks.  Melissa Katz, MD Allergy / Immunology [place]

## 2018-08-09 ENCOUNTER — Encounter: Payer: Self-pay | Admitting: Allergy and Immunology

## 2018-09-04 DIAGNOSIS — M79671 Pain in right foot: Secondary | ICD-10-CM | POA: Diagnosis not present

## 2018-09-12 DIAGNOSIS — D692 Other nonthrombocytopenic purpura: Secondary | ICD-10-CM | POA: Diagnosis not present

## 2018-09-12 DIAGNOSIS — Z85828 Personal history of other malignant neoplasm of skin: Secondary | ICD-10-CM | POA: Diagnosis not present

## 2018-09-12 DIAGNOSIS — D1801 Hemangioma of skin and subcutaneous tissue: Secondary | ICD-10-CM | POA: Diagnosis not present

## 2018-09-12 DIAGNOSIS — L814 Other melanin hyperpigmentation: Secondary | ICD-10-CM | POA: Diagnosis not present

## 2018-09-12 DIAGNOSIS — D224 Melanocytic nevi of scalp and neck: Secondary | ICD-10-CM | POA: Diagnosis not present

## 2018-09-12 DIAGNOSIS — D2261 Melanocytic nevi of right upper limb, including shoulder: Secondary | ICD-10-CM | POA: Diagnosis not present

## 2018-09-12 DIAGNOSIS — D2262 Melanocytic nevi of left upper limb, including shoulder: Secondary | ICD-10-CM | POA: Diagnosis not present

## 2018-09-12 DIAGNOSIS — L821 Other seborrheic keratosis: Secondary | ICD-10-CM | POA: Diagnosis not present

## 2018-09-12 DIAGNOSIS — D2272 Melanocytic nevi of left lower limb, including hip: Secondary | ICD-10-CM | POA: Diagnosis not present

## 2018-09-13 DIAGNOSIS — M898X7 Other specified disorders of bone, ankle and foot: Secondary | ICD-10-CM | POA: Diagnosis not present

## 2018-09-13 DIAGNOSIS — M67871 Other specified disorders of synovium, right ankle and foot: Secondary | ICD-10-CM | POA: Diagnosis not present

## 2018-10-03 ENCOUNTER — Ambulatory Visit: Payer: Self-pay

## 2018-10-09 DIAGNOSIS — M67879 Other specified disorders of synovium and tendon, unspecified ankle and foot: Secondary | ICD-10-CM | POA: Diagnosis not present

## 2018-10-18 DIAGNOSIS — M67879 Other specified disorders of synovium and tendon, unspecified ankle and foot: Secondary | ICD-10-CM | POA: Diagnosis not present

## 2018-10-27 DIAGNOSIS — M67879 Other specified disorders of synovium and tendon, unspecified ankle and foot: Secondary | ICD-10-CM | POA: Diagnosis not present

## 2018-10-31 DIAGNOSIS — M67879 Other specified disorders of synovium and tendon, unspecified ankle and foot: Secondary | ICD-10-CM | POA: Diagnosis not present

## 2018-11-01 DIAGNOSIS — M898X7 Other specified disorders of bone, ankle and foot: Secondary | ICD-10-CM | POA: Diagnosis not present

## 2018-11-01 DIAGNOSIS — M67871 Other specified disorders of synovium, right ankle and foot: Secondary | ICD-10-CM | POA: Diagnosis not present

## 2018-11-03 DIAGNOSIS — M67879 Other specified disorders of synovium and tendon, unspecified ankle and foot: Secondary | ICD-10-CM | POA: Diagnosis not present

## 2018-11-07 ENCOUNTER — Ambulatory Visit (INDEPENDENT_AMBULATORY_CARE_PROVIDER_SITE_OTHER): Payer: Medicare Other | Admitting: *Deleted

## 2018-11-07 ENCOUNTER — Other Ambulatory Visit: Payer: Self-pay

## 2018-11-07 DIAGNOSIS — T63441D Toxic effect of venom of bees, accidental (unintentional), subsequent encounter: Secondary | ICD-10-CM

## 2018-11-17 DIAGNOSIS — Z23 Encounter for immunization: Secondary | ICD-10-CM | POA: Diagnosis not present

## 2018-11-29 DIAGNOSIS — Z1231 Encounter for screening mammogram for malignant neoplasm of breast: Secondary | ICD-10-CM | POA: Diagnosis not present

## 2018-12-28 DIAGNOSIS — M25571 Pain in right ankle and joints of right foot: Secondary | ICD-10-CM | POA: Diagnosis not present

## 2018-12-28 DIAGNOSIS — M67871 Other specified disorders of synovium, right ankle and foot: Secondary | ICD-10-CM | POA: Diagnosis not present

## 2019-01-02 ENCOUNTER — Other Ambulatory Visit: Payer: Self-pay

## 2019-01-02 ENCOUNTER — Ambulatory Visit (INDEPENDENT_AMBULATORY_CARE_PROVIDER_SITE_OTHER): Payer: Medicare Other

## 2019-01-02 DIAGNOSIS — T63441D Toxic effect of venom of bees, accidental (unintentional), subsequent encounter: Secondary | ICD-10-CM

## 2019-01-05 DIAGNOSIS — M25571 Pain in right ankle and joints of right foot: Secondary | ICD-10-CM | POA: Diagnosis not present

## 2019-02-01 DIAGNOSIS — M5431 Sciatica, right side: Secondary | ICD-10-CM | POA: Diagnosis not present

## 2019-02-01 DIAGNOSIS — M51369 Other intervertebral disc degeneration, lumbar region without mention of lumbar back pain or lower extremity pain: Secondary | ICD-10-CM | POA: Insufficient documentation

## 2019-02-01 DIAGNOSIS — M4180 Other forms of scoliosis, site unspecified: Secondary | ICD-10-CM | POA: Diagnosis not present

## 2019-02-01 DIAGNOSIS — M5136 Other intervertebral disc degeneration, lumbar region: Secondary | ICD-10-CM | POA: Diagnosis not present

## 2019-02-01 DIAGNOSIS — M545 Low back pain: Secondary | ICD-10-CM | POA: Diagnosis not present

## 2019-02-14 ENCOUNTER — Ambulatory Visit: Payer: Medicare Other | Attending: Internal Medicine

## 2019-02-14 DIAGNOSIS — Z23 Encounter for immunization: Secondary | ICD-10-CM | POA: Insufficient documentation

## 2019-02-14 NOTE — Progress Notes (Signed)
   Covid-19 Vaccination Clinic  Name:  [name]    MRN: [id number] DOB: 10-May-1931  02/14/2019  [name] was observed post Covid-19 immunization for 15 minutes without incidence. She was provided with Vaccine Information Sheet and instruction to access the V-Safe system.   [name] was instructed to call 911 with any severe reactions post vaccine: Marland Kitchen Difficulty breathing  . Swelling of your face and throat  . A fast heartbeat  . A bad rash all over your body  . Dizziness and weakness    Immunizations Administered    Name Date Dose VIS Date Route   Pfizer COVID-19 Vaccine 02/14/2019  9:46 AM 0.3 mL 01/05/2019 Intramuscular   Manufacturer: [place]   Lot: [id number]   [place]: [id number]

## 2019-02-22 DIAGNOSIS — M5431 Sciatica, right side: Secondary | ICD-10-CM | POA: Diagnosis not present

## 2019-02-27 ENCOUNTER — Ambulatory Visit: Payer: Medicare Other

## 2019-03-07 ENCOUNTER — Ambulatory Visit: Payer: Medicare Other | Attending: Internal Medicine

## 2019-03-07 DIAGNOSIS — Z23 Encounter for immunization: Secondary | ICD-10-CM

## 2019-03-07 NOTE — Progress Notes (Signed)
   Covid-19 Vaccination Clinic  Name:  [name]    MRN: [id number] DOB: 04/02/1931  03/07/2019  [name] was observed post Covid-19 immunization for 15 minutes without incidence. She was provided with Vaccine Information Sheet and instruction to access the V-Safe system.   [name] was instructed to call 911 with any severe reactions post vaccine: Marland Kitchen Difficulty breathing  . Swelling of your face and throat  . A fast heartbeat  . A bad rash all over your body  . Dizziness and weakness    Immunizations Administered    Name Date Dose VIS Date Route   Pfizer COVID-19 Vaccine 03/07/2019  1:39 PM 0.3 mL 01/05/2019 Intramuscular   Manufacturer: [place]   Lot: [id number]   [place]: [id number]

## 2019-05-11 ENCOUNTER — Telehealth: Payer: Self-pay

## 2019-05-11 NOTE — Telephone Encounter (Signed)
Patient called and wanted to get her MV venom injection. Patient last received an injection on 01/02/2019 and is on maintenance dose at every 8 weeks. Patient was to return 02/27/2019 however patient stated that due to getting her Covid vaccines she decided to push her Venom injection back. Please advise

## 2019-05-14 NOTE — Telephone Encounter (Signed)
That is correct 

## 2019-05-14 NOTE — Telephone Encounter (Signed)
Please have her start at 50% of her previous dose and build up using schedule C

## 2019-05-14 NOTE — Telephone Encounter (Signed)
Hi Dr. Neldon Mc,  Could you please clarify if you mean for her to start at 0.5 and then come back weekly building up at 0.1 until she reaches her maintenance 1.0?

## 2019-05-15 NOTE — Telephone Encounter (Signed)
Called and informed patient of Dr. Bruna Potter recommendation. Patient verbalized understanding and will come weekly for 5 weeks then resume the every 8 weeks. Flowsheet updated to reflect changes.

## 2019-05-22 ENCOUNTER — Ambulatory Visit (INDEPENDENT_AMBULATORY_CARE_PROVIDER_SITE_OTHER): Payer: Medicare Other

## 2019-05-22 DIAGNOSIS — T63441D Toxic effect of venom of bees, accidental (unintentional), subsequent encounter: Secondary | ICD-10-CM | POA: Diagnosis not present

## 2019-05-29 ENCOUNTER — Ambulatory Visit (INDEPENDENT_AMBULATORY_CARE_PROVIDER_SITE_OTHER): Payer: Medicare Other

## 2019-05-29 ENCOUNTER — Other Ambulatory Visit: Payer: Self-pay

## 2019-05-29 DIAGNOSIS — T63441D Toxic effect of venom of bees, accidental (unintentional), subsequent encounter: Secondary | ICD-10-CM | POA: Diagnosis not present

## 2019-06-05 ENCOUNTER — Other Ambulatory Visit: Payer: Self-pay

## 2019-06-05 ENCOUNTER — Ambulatory Visit (INDEPENDENT_AMBULATORY_CARE_PROVIDER_SITE_OTHER): Payer: Medicare Other

## 2019-06-05 DIAGNOSIS — N3020 Other chronic cystitis without hematuria: Secondary | ICD-10-CM | POA: Diagnosis not present

## 2019-06-05 DIAGNOSIS — T63441D Toxic effect of venom of bees, accidental (unintentional), subsequent encounter: Secondary | ICD-10-CM

## 2019-06-05 DIAGNOSIS — R310 Gross hematuria: Secondary | ICD-10-CM | POA: Diagnosis not present

## 2019-06-11 ENCOUNTER — Ambulatory Visit (INDEPENDENT_AMBULATORY_CARE_PROVIDER_SITE_OTHER): Payer: Medicare Other

## 2019-06-11 ENCOUNTER — Other Ambulatory Visit: Payer: Self-pay

## 2019-06-11 DIAGNOSIS — T63441D Toxic effect of venom of bees, accidental (unintentional), subsequent encounter: Secondary | ICD-10-CM | POA: Diagnosis not present

## 2019-06-19 ENCOUNTER — Ambulatory Visit (INDEPENDENT_AMBULATORY_CARE_PROVIDER_SITE_OTHER): Payer: Medicare Other

## 2019-06-19 ENCOUNTER — Other Ambulatory Visit: Payer: Self-pay

## 2019-06-19 DIAGNOSIS — T63441D Toxic effect of venom of bees, accidental (unintentional), subsequent encounter: Secondary | ICD-10-CM | POA: Diagnosis not present

## 2019-07-03 ENCOUNTER — Ambulatory Visit: Payer: Self-pay

## 2019-07-06 ENCOUNTER — Other Ambulatory Visit: Payer: Self-pay

## 2019-07-06 ENCOUNTER — Ambulatory Visit (INDEPENDENT_AMBULATORY_CARE_PROVIDER_SITE_OTHER): Payer: Medicare Other

## 2019-07-06 DIAGNOSIS — T63441D Toxic effect of venom of bees, accidental (unintentional), subsequent encounter: Secondary | ICD-10-CM

## 2019-07-12 ENCOUNTER — Ambulatory Visit: Payer: Self-pay

## 2019-07-12 ENCOUNTER — Other Ambulatory Visit: Payer: Self-pay

## 2019-07-12 ENCOUNTER — Ambulatory Visit (INDEPENDENT_AMBULATORY_CARE_PROVIDER_SITE_OTHER): Payer: Medicare Other

## 2019-07-12 DIAGNOSIS — T63441D Toxic effect of venom of bees, accidental (unintentional), subsequent encounter: Secondary | ICD-10-CM

## 2019-07-19 ENCOUNTER — Other Ambulatory Visit: Payer: Self-pay

## 2019-07-19 ENCOUNTER — Ambulatory Visit (INDEPENDENT_AMBULATORY_CARE_PROVIDER_SITE_OTHER): Payer: Medicare Other

## 2019-07-19 DIAGNOSIS — T63441D Toxic effect of venom of bees, accidental (unintentional), subsequent encounter: Secondary | ICD-10-CM | POA: Diagnosis not present

## 2019-07-26 ENCOUNTER — Ambulatory Visit (INDEPENDENT_AMBULATORY_CARE_PROVIDER_SITE_OTHER): Payer: Medicare Other

## 2019-07-26 ENCOUNTER — Other Ambulatory Visit: Payer: Self-pay

## 2019-07-26 DIAGNOSIS — T63441D Toxic effect of venom of bees, accidental (unintentional), subsequent encounter: Secondary | ICD-10-CM | POA: Diagnosis not present

## 2019-08-02 ENCOUNTER — Other Ambulatory Visit: Payer: Self-pay

## 2019-08-02 ENCOUNTER — Ambulatory Visit: Payer: Self-pay

## 2019-08-02 ENCOUNTER — Ambulatory Visit (INDEPENDENT_AMBULATORY_CARE_PROVIDER_SITE_OTHER): Payer: Medicare Other

## 2019-08-02 DIAGNOSIS — T63441D Toxic effect of venom of bees, accidental (unintentional), subsequent encounter: Secondary | ICD-10-CM | POA: Diagnosis not present

## 2019-08-10 DIAGNOSIS — D689 Coagulation defect, unspecified: Secondary | ICD-10-CM | POA: Diagnosis not present

## 2019-08-10 DIAGNOSIS — E785 Hyperlipidemia, unspecified: Secondary | ICD-10-CM | POA: Diagnosis not present

## 2019-08-10 DIAGNOSIS — N1831 Chronic kidney disease, stage 3a: Secondary | ICD-10-CM | POA: Diagnosis not present

## 2019-08-10 DIAGNOSIS — Z0000 Encounter for general adult medical examination without abnormal findings: Secondary | ICD-10-CM | POA: Diagnosis not present

## 2019-08-10 DIAGNOSIS — H3411 Central retinal artery occlusion, right eye: Secondary | ICD-10-CM | POA: Diagnosis not present

## 2019-08-10 DIAGNOSIS — R7301 Impaired fasting glucose: Secondary | ICD-10-CM | POA: Diagnosis not present

## 2019-08-10 DIAGNOSIS — M79671 Pain in right foot: Secondary | ICD-10-CM | POA: Diagnosis not present

## 2019-08-10 DIAGNOSIS — Z1331 Encounter for screening for depression: Secondary | ICD-10-CM | POA: Diagnosis not present

## 2019-08-10 DIAGNOSIS — D692 Other nonthrombocytopenic purpura: Secondary | ICD-10-CM | POA: Diagnosis not present

## 2019-08-13 ENCOUNTER — Other Ambulatory Visit: Payer: Self-pay | Admitting: Internal Medicine

## 2019-08-13 DIAGNOSIS — R5381 Other malaise: Secondary | ICD-10-CM

## 2019-09-06 DIAGNOSIS — D692 Other nonthrombocytopenic purpura: Secondary | ICD-10-CM | POA: Diagnosis not present

## 2019-09-06 DIAGNOSIS — D2239 Melanocytic nevi of other parts of face: Secondary | ICD-10-CM | POA: Diagnosis not present

## 2019-09-06 DIAGNOSIS — Z85828 Personal history of other malignant neoplasm of skin: Secondary | ICD-10-CM | POA: Diagnosis not present

## 2019-09-06 DIAGNOSIS — L918 Other hypertrophic disorders of the skin: Secondary | ICD-10-CM | POA: Diagnosis not present

## 2019-09-06 DIAGNOSIS — L814 Other melanin hyperpigmentation: Secondary | ICD-10-CM | POA: Diagnosis not present

## 2019-09-06 DIAGNOSIS — L821 Other seborrheic keratosis: Secondary | ICD-10-CM | POA: Diagnosis not present

## 2019-09-06 DIAGNOSIS — D225 Melanocytic nevi of trunk: Secondary | ICD-10-CM | POA: Diagnosis not present

## 2019-09-06 DIAGNOSIS — L570 Actinic keratosis: Secondary | ICD-10-CM | POA: Diagnosis not present

## 2019-09-07 DIAGNOSIS — M67873 Other specified disorders of tendon, right ankle and foot: Secondary | ICD-10-CM | POA: Diagnosis not present

## 2019-09-20 DIAGNOSIS — M79671 Pain in right foot: Secondary | ICD-10-CM | POA: Diagnosis not present

## 2019-09-27 ENCOUNTER — Ambulatory Visit: Payer: Self-pay

## 2019-10-09 ENCOUNTER — Other Ambulatory Visit: Payer: Self-pay

## 2019-10-09 ENCOUNTER — Ambulatory Visit (INDEPENDENT_AMBULATORY_CARE_PROVIDER_SITE_OTHER): Payer: Medicare Other | Admitting: *Deleted

## 2019-10-09 DIAGNOSIS — T63441D Toxic effect of venom of bees, accidental (unintentional), subsequent encounter: Secondary | ICD-10-CM

## 2019-10-12 DIAGNOSIS — M79671 Pain in right foot: Secondary | ICD-10-CM | POA: Diagnosis not present

## 2019-10-15 DIAGNOSIS — M79671 Pain in right foot: Secondary | ICD-10-CM | POA: Diagnosis not present

## 2019-10-17 DIAGNOSIS — M79671 Pain in right foot: Secondary | ICD-10-CM | POA: Diagnosis not present

## 2019-10-19 DIAGNOSIS — M79671 Pain in right foot: Secondary | ICD-10-CM | POA: Diagnosis not present

## 2019-10-23 DIAGNOSIS — M79671 Pain in right foot: Secondary | ICD-10-CM | POA: Diagnosis not present

## 2019-11-05 DIAGNOSIS — R059 Cough, unspecified: Secondary | ICD-10-CM | POA: Diagnosis not present

## 2019-11-05 DIAGNOSIS — J310 Chronic rhinitis: Secondary | ICD-10-CM | POA: Diagnosis not present

## 2019-11-05 DIAGNOSIS — I480 Paroxysmal atrial fibrillation: Secondary | ICD-10-CM | POA: Diagnosis not present

## 2019-11-05 DIAGNOSIS — J189 Pneumonia, unspecified organism: Secondary | ICD-10-CM | POA: Diagnosis not present

## 2019-11-05 DIAGNOSIS — R509 Fever, unspecified: Secondary | ICD-10-CM | POA: Diagnosis not present

## 2019-11-12 DIAGNOSIS — Z1152 Encounter for screening for COVID-19: Secondary | ICD-10-CM | POA: Diagnosis not present

## 2019-11-12 DIAGNOSIS — I480 Paroxysmal atrial fibrillation: Secondary | ICD-10-CM | POA: Diagnosis not present

## 2019-11-12 DIAGNOSIS — J189 Pneumonia, unspecified organism: Secondary | ICD-10-CM | POA: Diagnosis not present

## 2019-11-13 ENCOUNTER — Emergency Department (HOSPITAL_COMMUNITY): Payer: Medicare Other

## 2019-11-13 ENCOUNTER — Emergency Department (HOSPITAL_COMMUNITY): Payer: Medicare Other | Admitting: Anesthesiology

## 2019-11-13 ENCOUNTER — Other Ambulatory Visit: Payer: Self-pay

## 2019-11-13 ENCOUNTER — Inpatient Hospital Stay (HOSPITAL_COMMUNITY)
Admission: EM | Admit: 2019-11-13 | Discharge: 2019-11-23 | DRG: 023 | Disposition: A | Discharge status: Rehabilitation Facility | Payer: Medicare Other | Attending: Neurology | Admitting: Neurology

## 2019-11-13 ENCOUNTER — Encounter (HOSPITAL_COMMUNITY): Payer: Self-pay | Admitting: Emergency Medicine

## 2019-11-13 ENCOUNTER — Encounter (HOSPITAL_COMMUNITY)
Admission: EM | Disposition: A | Discharge status: Rehabilitation Facility | Payer: Self-pay | Source: Home / Self Care | Attending: Neurology

## 2019-11-13 DIAGNOSIS — D62 Acute posthemorrhagic anemia: Secondary | ICD-10-CM | POA: Diagnosis present

## 2019-11-13 DIAGNOSIS — M2548 Effusion, other site: Secondary | ICD-10-CM | POA: Diagnosis not present

## 2019-11-13 DIAGNOSIS — S7011XA Contusion of right thigh, initial encounter: Secondary | ICD-10-CM | POA: Diagnosis not present

## 2019-11-13 DIAGNOSIS — I69354 Hemiplegia and hemiparesis following cerebral infarction affecting left non-dominant side: Secondary | ICD-10-CM | POA: Diagnosis not present

## 2019-11-13 DIAGNOSIS — K5900 Constipation, unspecified: Secondary | ICD-10-CM

## 2019-11-13 DIAGNOSIS — N390 Urinary tract infection, site not specified: Secondary | ICD-10-CM

## 2019-11-13 DIAGNOSIS — J439 Emphysema, unspecified: Secondary | ICD-10-CM | POA: Diagnosis present

## 2019-11-13 DIAGNOSIS — I4892 Unspecified atrial flutter: Secondary | ICD-10-CM | POA: Diagnosis present

## 2019-11-13 DIAGNOSIS — E785 Hyperlipidemia, unspecified: Secondary | ICD-10-CM | POA: Diagnosis present

## 2019-11-13 DIAGNOSIS — S8012XA Contusion of left lower leg, initial encounter: Secondary | ICD-10-CM | POA: Diagnosis not present

## 2019-11-13 DIAGNOSIS — Z79899 Other long term (current) drug therapy: Secondary | ICD-10-CM

## 2019-11-13 DIAGNOSIS — J90 Pleural effusion, not elsewhere classified: Secondary | ICD-10-CM | POA: Diagnosis not present

## 2019-11-13 DIAGNOSIS — R1310 Dysphagia, unspecified: Secondary | ICD-10-CM | POA: Diagnosis present

## 2019-11-13 DIAGNOSIS — J9600 Acute respiratory failure, unspecified whether with hypoxia or hypercapnia: Secondary | ICD-10-CM

## 2019-11-13 DIAGNOSIS — J9601 Acute respiratory failure with hypoxia: Secondary | ICD-10-CM | POA: Diagnosis present

## 2019-11-13 DIAGNOSIS — I63531 Cerebral infarction due to unspecified occlusion or stenosis of right posterior cerebral artery: Secondary | ICD-10-CM | POA: Diagnosis not present

## 2019-11-13 DIAGNOSIS — R4781 Slurred speech: Secondary | ICD-10-CM | POA: Diagnosis not present

## 2019-11-13 DIAGNOSIS — Z8249 Family history of ischemic heart disease and other diseases of the circulatory system: Secondary | ICD-10-CM | POA: Diagnosis not present

## 2019-11-13 DIAGNOSIS — I724 Aneurysm of artery of lower extremity: Secondary | ICD-10-CM | POA: Diagnosis not present

## 2019-11-13 DIAGNOSIS — R1909 Other intra-abdominal and pelvic swelling, mass and lump: Secondary | ICD-10-CM | POA: Diagnosis not present

## 2019-11-13 DIAGNOSIS — I743 Embolism and thrombosis of arteries of the lower extremities: Secondary | ICD-10-CM | POA: Diagnosis present

## 2019-11-13 DIAGNOSIS — R1312 Dysphagia, oropharyngeal phase: Secondary | ICD-10-CM | POA: Diagnosis not present

## 2019-11-13 DIAGNOSIS — I700 Atherosclerosis of aorta: Secondary | ICD-10-CM | POA: Diagnosis present

## 2019-11-13 DIAGNOSIS — Z882 Allergy status to sulfonamides status: Secondary | ICD-10-CM | POA: Diagnosis not present

## 2019-11-13 DIAGNOSIS — J470 Bronchiectasis with acute lower respiratory infection: Secondary | ICD-10-CM | POA: Diagnosis present

## 2019-11-13 DIAGNOSIS — I728 Aneurysm of other specified arteries: Secondary | ICD-10-CM | POA: Diagnosis not present

## 2019-11-13 DIAGNOSIS — Z8673 Personal history of transient ischemic attack (TIA), and cerebral infarction without residual deficits: Secondary | ICD-10-CM | POA: Diagnosis present

## 2019-11-13 DIAGNOSIS — R197 Diarrhea, unspecified: Secondary | ICD-10-CM | POA: Diagnosis not present

## 2019-11-13 DIAGNOSIS — G8194 Hemiplegia, unspecified affecting left nondominant side: Secondary | ICD-10-CM | POA: Diagnosis present

## 2019-11-13 DIAGNOSIS — Z20822 Contact with and (suspected) exposure to covid-19: Secondary | ICD-10-CM | POA: Diagnosis present

## 2019-11-13 DIAGNOSIS — I63431 Cerebral infarction due to embolism of right posterior cerebral artery: Secondary | ICD-10-CM | POA: Diagnosis not present

## 2019-11-13 DIAGNOSIS — E876 Hypokalemia: Secondary | ICD-10-CM | POA: Diagnosis not present

## 2019-11-13 DIAGNOSIS — I10 Essential (primary) hypertension: Secondary | ICD-10-CM | POA: Diagnosis present

## 2019-11-13 DIAGNOSIS — J189 Pneumonia, unspecified organism: Secondary | ICD-10-CM | POA: Diagnosis present

## 2019-11-13 DIAGNOSIS — Y848 Other medical procedures as the cause of abnormal reaction of the patient, or of later complication, without mention of misadventure at the time of the procedure: Secondary | ICD-10-CM | POA: Diagnosis not present

## 2019-11-13 DIAGNOSIS — Z800 Family history of malignant neoplasm of digestive organs: Secondary | ICD-10-CM

## 2019-11-13 DIAGNOSIS — Z885 Allergy status to narcotic agent status: Secondary | ICD-10-CM

## 2019-11-13 DIAGNOSIS — K5901 Slow transit constipation: Secondary | ICD-10-CM

## 2019-11-13 DIAGNOSIS — Z87891 Personal history of nicotine dependence: Secondary | ICD-10-CM | POA: Diagnosis not present

## 2019-11-13 DIAGNOSIS — Z91030 Bee allergy status: Secondary | ICD-10-CM | POA: Diagnosis not present

## 2019-11-13 DIAGNOSIS — I609 Nontraumatic subarachnoid hemorrhage, unspecified: Secondary | ICD-10-CM | POA: Diagnosis present

## 2019-11-13 DIAGNOSIS — I480 Paroxysmal atrial fibrillation: Secondary | ICD-10-CM | POA: Diagnosis present

## 2019-11-13 DIAGNOSIS — R531 Weakness: Secondary | ICD-10-CM | POA: Diagnosis present

## 2019-11-13 DIAGNOSIS — L7632 Postprocedural hematoma of skin and subcutaneous tissue following other procedure: Secondary | ICD-10-CM | POA: Diagnosis not present

## 2019-11-13 DIAGNOSIS — R0902 Hypoxemia: Secondary | ICD-10-CM | POA: Diagnosis not present

## 2019-11-13 DIAGNOSIS — R0689 Other abnormalities of breathing: Secondary | ICD-10-CM | POA: Diagnosis not present

## 2019-11-13 DIAGNOSIS — R29818 Other symptoms and signs involving the nervous system: Secondary | ICD-10-CM | POA: Diagnosis not present

## 2019-11-13 DIAGNOSIS — S301XXA Contusion of abdominal wall, initial encounter: Secondary | ICD-10-CM | POA: Diagnosis not present

## 2019-11-13 DIAGNOSIS — I639 Cerebral infarction, unspecified: Secondary | ICD-10-CM | POA: Diagnosis not present

## 2019-11-13 DIAGNOSIS — I6389 Other cerebral infarction: Secondary | ICD-10-CM | POA: Diagnosis not present

## 2019-11-13 DIAGNOSIS — Z888 Allergy status to other drugs, medicaments and biological substances status: Secondary | ICD-10-CM

## 2019-11-13 DIAGNOSIS — S7012XA Contusion of left thigh, initial encounter: Secondary | ICD-10-CM | POA: Diagnosis not present

## 2019-11-13 DIAGNOSIS — R29810 Facial weakness: Secondary | ICD-10-CM | POA: Diagnosis present

## 2019-11-13 DIAGNOSIS — I6623 Occlusion and stenosis of bilateral posterior cerebral arteries: Secondary | ICD-10-CM | POA: Diagnosis not present

## 2019-11-13 DIAGNOSIS — R471 Dysarthria and anarthria: Secondary | ICD-10-CM | POA: Diagnosis present

## 2019-11-13 DIAGNOSIS — Z7289 Other problems related to lifestyle: Secondary | ICD-10-CM

## 2019-11-13 DIAGNOSIS — I729 Aneurysm of unspecified site: Secondary | ICD-10-CM | POA: Diagnosis not present

## 2019-11-13 DIAGNOSIS — I4891 Unspecified atrial fibrillation: Secondary | ICD-10-CM | POA: Diagnosis not present

## 2019-11-13 DIAGNOSIS — T81718A Complication of other artery following a procedure, not elsewhere classified, initial encounter: Secondary | ICD-10-CM | POA: Diagnosis not present

## 2019-11-13 DIAGNOSIS — Z881 Allergy status to other antibiotic agents status: Secondary | ICD-10-CM | POA: Diagnosis not present

## 2019-11-13 DIAGNOSIS — Z4682 Encounter for fitting and adjustment of non-vascular catheter: Secondary | ICD-10-CM | POA: Diagnosis not present

## 2019-11-13 DIAGNOSIS — Z9889 Other specified postprocedural states: Secondary | ICD-10-CM | POA: Diagnosis not present

## 2019-11-13 DIAGNOSIS — R29711 NIHSS score 11: Secondary | ICD-10-CM | POA: Diagnosis present

## 2019-11-13 DIAGNOSIS — I6329 Cerebral infarction due to unspecified occlusion or stenosis of other precerebral arteries: Secondary | ICD-10-CM | POA: Diagnosis not present

## 2019-11-13 DIAGNOSIS — Z9289 Personal history of other medical treatment: Secondary | ICD-10-CM

## 2019-11-13 DIAGNOSIS — Z7901 Long term (current) use of anticoagulants: Secondary | ICD-10-CM

## 2019-11-13 DIAGNOSIS — R40241 Glasgow coma scale score 13-15, unspecified time: Secondary | ICD-10-CM | POA: Diagnosis present

## 2019-11-13 DIAGNOSIS — I9789 Other postprocedural complications and disorders of the circulatory system, not elsewhere classified: Secondary | ICD-10-CM | POA: Diagnosis not present

## 2019-11-13 DIAGNOSIS — I6601 Occlusion and stenosis of right middle cerebral artery: Secondary | ICD-10-CM | POA: Diagnosis not present

## 2019-11-13 DIAGNOSIS — A020 Salmonella enteritis: Secondary | ICD-10-CM | POA: Diagnosis not present

## 2019-11-13 DIAGNOSIS — R509 Fever, unspecified: Secondary | ICD-10-CM | POA: Diagnosis not present

## 2019-11-13 DIAGNOSIS — I482 Chronic atrial fibrillation, unspecified: Secondary | ICD-10-CM | POA: Diagnosis not present

## 2019-11-13 DIAGNOSIS — Z7982 Long term (current) use of aspirin: Secondary | ICD-10-CM

## 2019-11-13 DIAGNOSIS — I6621 Occlusion and stenosis of right posterior cerebral artery: Secondary | ICD-10-CM | POA: Diagnosis not present

## 2019-11-13 DIAGNOSIS — J3489 Other specified disorders of nose and nasal sinuses: Secondary | ICD-10-CM | POA: Diagnosis not present

## 2019-11-13 DIAGNOSIS — R350 Frequency of micturition: Secondary | ICD-10-CM | POA: Diagnosis not present

## 2019-11-13 HISTORY — DX: Sciatica, unspecified side: M54.30

## 2019-11-13 HISTORY — DX: Achilles tendinitis, right leg: M76.61

## 2019-11-13 HISTORY — PX: RADIOLOGY WITH ANESTHESIA: SHX6223

## 2019-11-13 LAB — I-STAT CHEM 8, ED
BUN: 20 mg/dL (ref 8–23)
Calcium, Ion: 1.16 mmol/L (ref 1.15–1.40)
Chloride: 103 mmol/L (ref 98–111)
Creatinine, Ser: 0.7 mg/dL (ref 0.44–1.00)
Glucose, Bld: 102 mg/dL — ABNORMAL HIGH (ref 70–99)
HCT: 39 % (ref 36.0–46.0)
Hemoglobin: 13.3 g/dL (ref 12.0–15.0)
Potassium: 3.9 mmol/L (ref 3.5–5.1)
Sodium: 142 mmol/L (ref 135–145)
TCO2: 27 mmol/L (ref 22–32)

## 2019-11-13 LAB — CBC
HCT: 40.4 % (ref 36.0–46.0)
Hemoglobin: 12.8 g/dL (ref 12.0–15.0)
MCH: 31.7 pg (ref 26.0–34.0)
MCHC: 31.7 g/dL (ref 30.0–36.0)
MCV: 100 fL (ref 80.0–100.0)
Platelets: 343 10*3/uL (ref 150–400)
RBC: 4.04 MIL/uL (ref 3.87–5.11)
RDW: 12.4 % (ref 11.5–15.5)
WBC: 6.3 10*3/uL (ref 4.0–10.5)
nRBC: 0 % (ref 0.0–0.2)

## 2019-11-13 LAB — DIFFERENTIAL
Abs Immature Granulocytes: 0.03 10*3/uL (ref 0.00–0.07)
Basophils Absolute: 0.1 10*3/uL (ref 0.0–0.1)
Basophils Relative: 1 %
Eosinophils Absolute: 0.2 10*3/uL (ref 0.0–0.5)
Eosinophils Relative: 3 %
Immature Granulocytes: 1 %
Lymphocytes Relative: 23 %
Lymphs Abs: 1.5 10*3/uL (ref 0.7–4.0)
Monocytes Absolute: 0.8 10*3/uL (ref 0.1–1.0)
Monocytes Relative: 13 %
Neutro Abs: 3.8 10*3/uL (ref 1.7–7.7)
Neutrophils Relative %: 59 %

## 2019-11-13 LAB — COMPREHENSIVE METABOLIC PANEL
ALT: 61 U/L — ABNORMAL HIGH (ref 0–44)
AST: 63 U/L — ABNORMAL HIGH (ref 15–41)
Albumin: 2.8 g/dL — ABNORMAL LOW (ref 3.5–5.0)
Alkaline Phosphatase: 46 U/L (ref 38–126)
Anion gap: 10 (ref 5–15)
BUN: 20 mg/dL (ref 8–23)
CO2: 24 mmol/L (ref 22–32)
Calcium: 8.3 mg/dL — ABNORMAL LOW (ref 8.9–10.3)
Chloride: 103 mmol/L (ref 98–111)
Creatinine, Ser: 0.83 mg/dL (ref 0.44–1.00)
GFR, Estimated: >60 mL/min (ref >60)
Glucose, Bld: 107 mg/dL — ABNORMAL HIGH (ref 70–99)
Potassium: 3.9 mmol/L (ref 3.5–5.1)
Sodium: 137 mmol/L (ref 135–145)
Total Bilirubin: 0.8 mg/dL (ref 0.3–1.2)
Total Protein: 6.2 g/dL — ABNORMAL LOW (ref 6.5–8.1)

## 2019-11-13 LAB — PROTIME-INR
INR: 1 (ref 0.8–1.2)
Prothrombin Time: 12.8 seconds (ref 11.4–15.2)

## 2019-11-13 LAB — ETHANOL: Alcohol, Ethyl (B): <10 mg/dL (ref <10)

## 2019-11-13 LAB — APTT: aPTT: 31 seconds (ref 24–36)

## 2019-11-13 SURGERY — IR WITH ANESTHESIA
Anesthesia: General

## 2019-11-13 MED ORDER — CLEVIDIPINE BUTYRATE 0.5 MG/ML IV EMUL
INTRAVENOUS | Status: AC
  Filled 2019-11-13: qty 50

## 2019-11-13 MED ORDER — CLEVIDIPINE BUTYRATE 0.5 MG/ML IV EMUL
0.0000 mg/h | INTRAVENOUS | Status: DC
  Filled 2019-11-13: qty 50

## 2019-11-13 MED ORDER — ALTEPLASE (STROKE) FULL DOSE INFUSION: 0.9000 mg/kg | Freq: Once | INTRAVENOUS | Status: DC

## 2019-11-13 MED ORDER — SODIUM CHLORIDE 0.9 % IV SOLN: 50.0000 mL | Freq: Once | INTRAVENOUS | Status: DC

## 2019-11-13 MED ORDER — ALTEPLASE (STROKE) FULL DOSE INFUSION
0.9000 mg/kg | Freq: Once | INTRAVENOUS | Status: AC
  Administered 2019-11-13: 59.8 mg via INTRAVENOUS
  Filled 2019-11-13: qty 100

## 2019-11-13 MED ORDER — IOHEXOL 350 MG/ML SOLN
60.0000 mL | Freq: Once | INTRAVENOUS | Status: AC | PRN
  Administered 2019-11-13: 60 mL via INTRAVENOUS

## 2019-11-13 NOTE — Anesthesia Preprocedure Evaluation (Addendum)
Anesthesia Evaluation  Patient identified by MRN, date of birth, ID band Patient awake    Reviewed: Allergy & Precautions, NPO status , Patient's Chart, lab work & pertinent test results, reviewed documented beta blocker date and time   History of Anesthesia Complications Negative for: history of anesthetic complications  Airway Mallampati: I  TM Distance: >3 FB Neck ROM: Full    Dental  (+) Caps, Dental Advisory Given   Pulmonary pneumonia, unresolved, former smoker,    breath sounds clear to auscultation       Cardiovascular hypertension, Pt. on medications and Pt. on home beta blockers (-) angina+ dysrhythmias Atrial Fibrillation  Rhythm:Irregular Rate:Tachycardia  '14 ECHO: EF 60-65%, no significant valvular abnormalities   Neuro/Psych Last seen normal 9-9:30 pm CVA (acute MCA CVA: L neglect, R gaze), Residual Symptoms    GI/Hepatic negative GI ROS, Elevated LFTs   Endo/Other  negative endocrine ROS  Renal/GU negative Renal ROS     Musculoskeletal   Abdominal   Peds  Hematology negative hematology ROS (+)   Anesthesia Other Findings   Reproductive/Obstetrics                            Anesthesia Physical Anesthesia Plan  ASA: IV and emergent  Anesthesia Plan: General   Post-op Pain Management:    Induction: Intravenous and Rapid sequence  PONV Risk Score and Plan: 3 and Ondansetron and Treatment may vary due to age or medical condition  Airway Management Planned: Oral ETT  Additional Equipment: Arterial line  Intra-op Plan:   Post-operative Plan: Possible Post-op intubation/ventilation  Informed Consent: I have reviewed the patients History and Physical, chart, labs and discussed the procedure including the risks, benefits and alternatives for the proposed anesthesia with the patient or authorized representative who has indicated his/her understanding and acceptance.      Dental advisory given, Consent reviewed with POA and Only emergency history available  Plan Discussed with: Surgeon and CRNA  Anesthesia Plan Comments:         Anesthesia Quick Evaluation

## 2019-11-13 NOTE — Progress Notes (Signed)
PHARMACIST CODE STROKE RESPONSE  Notified to mix tPA at 2254 by Dr. Curly Shores Delivered tPA to RN at 2257  tPA dose = 6 mg bolus over 1 minute followed by 53.8 mg for a total dose of 59.8mg  over 1 hour  Issues/delays encountered (if applicable):   Duanne Limerick PharmD. BCPS 11/13/19 11:01 PM

## 2019-11-13 NOTE — ED Triage Notes (Signed)
Patient from home, sudden onset of slurred speech, weakness on the left side and right sided gaze around 2115.  Patient is CAOx4, GCS 15 and follows commands.  CBG of 134.  No stroke history, takes baby ASA daily.  Recent UTI and PNA.

## 2019-11-13 NOTE — Code Documentation (Signed)
Stroke Response Nurse Documentation Code Documentation  DENIM START is a 86 y.o. female arriving to [facility] ED via [facility] on 10/19 with past medical hx of Afib . Code stroke was activated by EMS. Patient from home where she was LKW at 2115 and now complaining of right gaze and left weakness . On aspirin 81 mg daily. Stroke team at the bedside on patient arrival. Labs drawn and patient cleared for CT by Quincy Carnes PA. Patient to CT with team. NIHSS 11, see documentation for details and code stroke times. Patient with disoriented, right gaze preference , left facial droop, left arm weakness, left leg weakness, right limb ataxia, left decreased sensation, dysarthria  and Sensory  neglect on exam. The following imaging was completed:  CTA head and neck. Patient is a candidate for tPA due to hemiparesis. Pt to IR for mechanical thrombectomy. Bedside handoff with IR RN Prentice Docker.    Madelynn Done  Rapid Response RN

## 2019-11-13 NOTE — ED Provider Notes (Signed)
[name]MENT Provider Note   CSN: [id number] Arrival date & time: 11/13/19  2237     History Chief Complaint  Patient presents with  . Code Stroke    [name] is a 86 y.o. female.  The history is provided by the patient and medical records.    86 year old female with history of A. fib on aspirin only, prior bladder repair, recent double pneumonia, presenting to the ED as a code stroke.  Ate dinner with family and shortly after had slurred speech and left-sided weakness around 9:15 PM.  She has no history of same.  On EMS arrival she was noted to have right-sided gaze.  She has not had any vomiting.  She is not had any falls or head trauma.  No prior history of stroke.  She did have daily aspirin this morning.  Past Medical History:  Diagnosis Date  . Atrial fibrillation ([place])    Prior treatment with amiodarone, held sinus rhythm and drug stopped  . Atrial flutter ([place])    Status post ablation prior to  2009  . Chest pain    Nuclear, 2004, no scar or ischemia, EF 77%  . Ejection fraction    EF 70%, nuclear,  2004  //     . Fall    July, 2014  . H/O bladder repair surgery     Patient Active Problem List   Diagnosis Date Noted  . Hymenoptera allergy 10/05/2014  . SVT (supraventricular tachycardia) ([place]) 10/20/2012  . Fall   . Chest pain   . Ejection fraction   . Atrial fibrillation ([place])   . Atrial flutter [facility])     Past Surgical History:  Procedure Laterality Date  . no surgical hx       OB History   No obstetric history on file.     No family history on file.  Social History   Tobacco Use  . Smoking status: Former Research scientist (life sciences)  . Smokeless tobacco: Never Used  Vaping Use  . Vaping Use: Never used  Substance Use Topics  . Alcohol use: Yes  . Drug use: Never    Home Medications Prior to Admission medications   Medication Sig Start Date End Date Taking? Authorizing Provider  aspirin 81 MG tablet Take 1 tablet  (81 mg total) by mouth daily. 01/08/13   Carlena Bjornstad, MD  atorvastatin (LIPITOR) 10 MG tablet Take 10 mg by mouth daily.    [provider]  BIOTIN PO Take 1 tablet by mouth daily.     [provider]  CALCIUM PO Take 1 tablet by mouth daily.     [provider]  Coenzyme Q10 (CO Q-10 PO) Take 1 tablet by mouth daily.     [provider]  EPINEPHrine 0.3 mg/0.3 mL IJ SOAJ injection INJECT INTRAMUSCULARLY AS DIRECTED FOR ANAPHYLAXIS 08/04/18   [provider]  Investigational - Study Medication Additional Study Details    [provider]  metoprolol succinate (TOPROL-XL) 50 MG 24 hr tablet Take 50 mg by mouth daily. Take with or immediately following a meal.    [provider]  trimethoprim (TRIMPEX) 100 MG tablet Take 100 mg by mouth as directed.    [provider]    Allergies    Bee venom, Mixed vespid venom, Codeine, Sulfa antibiotics, and Erythromycin  Review of Systems   Review of Systems  Neurological: Positive for speech difficulty and weakness.  All other systems reviewed and are  negative.   Physical Exam Updated Vital Signs BP (!) 163/73   Pulse 79   Resp 20   Wt 66.4 kg   LMP  (LMP Unknown)   SpO2 97%   BMI 28.59 kg/m   Physical Exam Vitals and nursing note reviewed.  Constitutional:      Appearance: She is well-developed.  HENT:     Head: Normocephalic and atraumatic.  Eyes:     Conjunctiva/sclera: Conjunctivae normal.     Pupils: Pupils are equal, round, and reactive to light.  Cardiovascular:     Rate and Rhythm: Normal rate and regular rhythm.     Heart sounds: Normal heart sounds.  Pulmonary:     Effort: Pulmonary effort is normal.     Breath sounds: Normal breath sounds.  Abdominal:     General: Bowel sounds are normal.     Palpations: Abdomen is soft.  Musculoskeletal:        General: Normal range of motion.     Cervical back: Normal range of motion.  Skin:    General: Skin  is warm and dry.  Neurological:     Mental Status: She is alert and oriented to person, place, and time.     Comments: AAOx3, able to answer questions correctly, speech is somewhat slurred, left sided weakness compared with right, LUE>LLE, right gaze preference noted     ED Results / Procedures / Treatments   Labs (all labs ordered are listed, but only abnormal results are displayed) Labs Reviewed  COMPREHENSIVE METABOLIC PANEL - Abnormal; Notable for the following components:      Result Value   Glucose, Bld 107 (*)    Calcium 8.3 (*)    Total Protein 6.2 (*)    Albumin 2.8 (*)    AST 63 (*)    ALT 61 (*)    All other components within normal limits  I-STAT CHEM 8, ED - Abnormal; Notable for the following components:   Glucose, Bld 102 (*)    All other components within normal limits  RESPIRATORY PANEL BY RT PCR (FLU A&B, COVID)  ETHANOL  PROTIME-INR  APTT  CBC  DIFFERENTIAL  RAPID URINE DRUG SCREEN, HOSP PERFORMED  URINALYSIS, ROUTINE W REFLEX MICROSCOPIC    EKG None  Radiology CT Code Stroke CTA Head W/WO contrast  Result Date: 11/13/2019 CLINICAL DATA:  86 year old female code stroke presentation. Presentation suspicious for LV0, receiving IV tPA. History also of recent outpatient treatment for pneumonia. EXAM: CT ANGIOGRAPHY HEAD AND NECK TECHNIQUE: Multidetector CT imaging of the head and neck was performed using the standard protocol during bolus administration of intravenous contrast. Multiplanar CT image reconstructions and MIPs were obtained to evaluate the vascular anatomy. Carotid stenosis measurements (when applicable) are obtained utilizing NASCET criteria, using the distal internal carotid diameter as the denominator. CONTRAST:  60 mL Omnipaque 350. COMPARISON:  Plain head CT 2253 hours today. FINDINGS: CTA NECK Skeleton: Widespread advanced cervical spine degeneration. No acute osseous abnormality identified. Upper chest: Small to moderate layering left pleural  effusion. Moderate to severe emphysema in the visible upper lungs. Patchy additional peripheral left upper lobe opacity. No superior mediastinal lymphadenopathy. Other neck: No acute findings. Aortic arch: Mild to moderate arch atherosclerosis. Three vessel arch configuration. Right carotid system: Brachiocephalic artery and right CCA origin within normal limits. Mild plaque proximal to the bifurcation without stenosis. Mild soft plaque at the medial right ICA origin without stenosis. Partially retropharyngeal course of the right ICA without stenosis to the skull base.  Left carotid system: Mild plaque at the left CCA origin and proximal to the bifurcation without stenosis. Negative bifurcation. Tortuous left ICA with a partially retropharyngeal course, no stenosis. Vertebral arteries: Proximal right subclavian artery plaque without stenosis. Normal right vertebral artery origin. Right vertebral artery is patent and within normal limits to the skull base. Mild to moderate proximal left subclavian artery plaque without significant stenosis. Normal left vertebral artery origin. Tortuous left V1 segment. Left vertebral artery is fairly codominant and patent to the skull base without stenosis. CTA HEAD Posterior circulation: Codominant distal vertebral arteries are patent to the vertebrobasilar junction without plaque or stenosis. The a ICAs may be dominant. Patent basilar artery without stenosis. Patent SCA and left PCA origins. However, the right PCA is occluded just beyond its origin (series 11, image 26) with poor reconstitution distally. The left PCA P2 and P3 segments are mildly irregular with mild stenosis. Anterior circulation: Both ICA siphons are patent. Mild calcified plaque on the left. Mild left supraclinoid ICA stenosis. Similar mild calcified plaque on the right and mild supraclinoid stenosis. Patent carotid termini. Posterior communicating arteries are diminutive or absent. Patent MCA and ACA origins. The  left ACA is dominant and the right is diminutive with subsequent azygos type ACA anatomy. No definite ACA occlusion. Left MCA M1 segment and trifurcation are patent without stenosis. Right MCA M1 segment and bifurcation are patent without stenosis. Bilateral MCA branches appear patent with mild irregularity. Venous sinuses: Early contrast timing, not evaluated. Anatomic variants: Dominant left ACA with azygos type ACA anatomy. Review of the MIP images confirms the above findings IMPRESSION: 1. Positive for occlusion of the Right PCA origin. 2. No other large vessel occlusion, and generally mild for age atherosclerosis in the head and neck. 3. Emphysema (ICD10-J43.9) with layering left pleural effusion, patchy left upper lobe opacity compatible with pneumonia in this setting. 4. Aortic Atherosclerosis (ICD10-I70.0) Preliminary report of the above discussed by telephone with Dr. Lesleigh Noe on 11/13/2019 at 2314 hours. Electronically Signed   By: Genevie Ann M.D.   On: 11/13/2019 23:25   CT Code Stroke CTA Neck W/WO contrast  Result Date: 11/13/2019 CLINICAL DATA:  86 year old female code stroke presentation. Presentation suspicious for LV0, receiving IV tPA. History also of recent outpatient treatment for pneumonia. EXAM: CT ANGIOGRAPHY HEAD AND NECK TECHNIQUE: Multidetector CT imaging of the head and neck was performed using the standard protocol during bolus administration of intravenous contrast. Multiplanar CT image reconstructions and MIPs were obtained to evaluate the vascular anatomy. Carotid stenosis measurements (when applicable) are obtained utilizing NASCET criteria, using the distal internal carotid diameter as the denominator. CONTRAST:  60 mL Omnipaque 350. COMPARISON:  Plain head CT 2253 hours today. FINDINGS: CTA NECK Skeleton: Widespread advanced cervical spine degeneration. No acute osseous abnormality identified. Upper chest: Small to moderate layering left pleural effusion. Moderate to severe  emphysema in the visible upper lungs. Patchy additional peripheral left upper lobe opacity. No superior mediastinal lymphadenopathy. Other neck: No acute findings. Aortic arch: Mild to moderate arch atherosclerosis. Three vessel arch configuration. Right carotid system: Brachiocephalic artery and right CCA origin within normal limits. Mild plaque proximal to the bifurcation without stenosis. Mild soft plaque at the medial right ICA origin without stenosis. Partially retropharyngeal course of the right ICA without stenosis to the skull base. Left carotid system: Mild plaque at the left CCA origin and proximal to the bifurcation without stenosis. Negative bifurcation. Tortuous left ICA with a partially retropharyngeal course, no stenosis. Vertebral arteries:  Proximal right subclavian artery plaque without stenosis. Normal right vertebral artery origin. Right vertebral artery is patent and within normal limits to the skull base. Mild to moderate proximal left subclavian artery plaque without significant stenosis. Normal left vertebral artery origin. Tortuous left V1 segment. Left vertebral artery is fairly codominant and patent to the skull base without stenosis. CTA HEAD Posterior circulation: Codominant distal vertebral arteries are patent to the vertebrobasilar junction without plaque or stenosis. The a ICAs may be dominant. Patent basilar artery without stenosis. Patent SCA and left PCA origins. However, the right PCA is occluded just beyond its origin (series 11, image 26) with poor reconstitution distally. The left PCA P2 and P3 segments are mildly irregular with mild stenosis. Anterior circulation: Both ICA siphons are patent. Mild calcified plaque on the left. Mild left supraclinoid ICA stenosis. Similar mild calcified plaque on the right and mild supraclinoid stenosis. Patent carotid termini. Posterior communicating arteries are diminutive or absent. Patent MCA and ACA origins. The left ACA is dominant and the  right is diminutive with subsequent azygos type ACA anatomy. No definite ACA occlusion. Left MCA M1 segment and trifurcation are patent without stenosis. Right MCA M1 segment and bifurcation are patent without stenosis. Bilateral MCA branches appear patent with mild irregularity. Venous sinuses: Early contrast timing, not evaluated. Anatomic variants: Dominant left ACA with azygos type ACA anatomy. Review of the MIP images confirms the above findings IMPRESSION: 1. Positive for occlusion of the Right PCA origin. 2. No other large vessel occlusion, and generally mild for age atherosclerosis in the head and neck. 3. Emphysema (ICD10-J43.9) with layering left pleural effusion, patchy left upper lobe opacity compatible with pneumonia in this setting. 4. Aortic Atherosclerosis (ICD10-I70.0) Preliminary report of the above discussed by telephone with Dr. Lesleigh Noe on 11/13/2019 at 2314 hours. Electronically Signed   By: Genevie Ann M.D.   On: 11/13/2019 23:25   CT HEAD CODE STROKE WO CONTRAST  Result Date: 11/13/2019 CLINICAL DATA:  Code stroke. 86 year old female with rightward gaze and left side weakness. EXAM: CT HEAD WITHOUT CONTRAST TECHNIQUE: Contiguous axial images were obtained from the base of the skull through the vertex without intravenous contrast. COMPARISON:  Head CT 02/04/2005. FINDINGS: Brain: No acute intracranial hemorrhage identified. No midline shift, mass effect, or evidence of intracranial mass lesion. Chronic cerebral volume loss. Ex vacuo appearing ventricular enlargement. Patchy bilateral cerebral white matter hypodensity. Age indeterminate patchy hypodensity in the thalami. Basal ganglia appear relatively spared. No cortically based acute infarct identified. No cortical encephalomalacia identified. Brainstem and cerebellum are within normal limits. Vascular: Calcified atherosclerosis at the skull base. No suspicious intracranial vascular hyperdensity. Skull: No acute osseous abnormality  identified. Sinuses/Orbits: Mild new mastoid effusions. Paranasal sinuses remain well pneumatized. Other: No acute orbit or scalp soft tissue finding. Negative visible nasopharynx. ASPECTS [facility] Stroke Program Early CT Score) Total score (0-10 with 10 being normal): 10 IMPRESSION: 1. Age indeterminate small vessel disease including in the right thalamus, but no acute cortically based infarct or acute intracranial hemorrhage identified. ASPECTS 10. 2. These results were communicated to Dr. Curly Shores At 10:56 pm on 11/13/2019 by text page via the [facility] messaging system. And also discussed by telephone at that same time. Electronically Signed   By: Genevie Ann M.D.   On: 11/13/2019 22:57    Procedures Procedures (including critical care time)  CRITICAL CARE Performed by: Larene Pickett   Total critical care time: 45 minutes  Critical care time was exclusive of separately billable  procedures and treating other patients.  Critical care was necessary to treat or prevent imminent or life-threatening deterioration.  Critical care was time spent personally by me on the following activities: development of treatment plan with patient and/or surrogate as well as nursing, discussions with consultants, evaluation of patient's response to treatment, examination of patient, obtaining history from patient or surrogate, ordering and performing treatments and interventions, ordering and review of laboratory studies, ordering and review of radiographic studies, pulse oximetry and re-evaluation of patient's condition.   Medications Ordered in ED Medications  clevidipine (CLEVIPREX) infusion 0.5 mg/mL (has no administration in time range)  alteplase (ACTIVASE) 1 mg/mL infusion 59.8 mg (has no administration in time range)    Followed by  0.9 %  sodium chloride infusion (has no administration in time range)  clevidipine (CLEVIPREX) 0.5 MG/ML infusion (has no administration in time range)  iohexol (OMNIPAQUE) 350  MG/ML injection 60 mL (60 mLs Intravenous Contrast Given 11/13/19 2324)    ED Course  I have reviewed the triage vital signs and the nursing notes.  Pertinent labs & imaging results that were available during my care of the patient were reviewed by me and considered in my medical decision making (see chart for details).    MDM Rules/Calculators/A&P  86 year old female presenting to the ED as a code stroke.  Last known well 9:15 PM.  Had sudden onset of slurred speech and left-sided weakness.  No prior stroke history, does take daily aspirin.  She is awake, alert, oriented on arrival.  She is able to answer questions and follow commands.  Does have noted dysarthria, right gaze preference, and left-sided weakness, worse LUE than LLE.  CT head negative, CTA is positive for right PCA occlusion.  After obtaining consent from family, tPA given at 2257.  Patient off to IR for intervention.  Neurology to admit.    Final Clinical Impression(s) / ED Diagnoses Final diagnoses:  Acute ischemic stroke [facility])    Rx / DC Orders ED Discharge Orders    None       Larene Pickett, PA-C 11/14/19 0002    Veryl Speak, MD 11/14/19 [phone]

## 2019-11-13 NOTE — H&P (Addendum)
Neurology Admission  Reason for Consult:   CC: right gaze preference and left sided weakness    History is obtained from: Patient, family and chart review   HPI: [name] is a 86 y.o. female with a PMHx of Afib/flutter s/p ablation, off AC due to persistent sinus rhythm, and a recent diagnosis of pneumonia, presenting with acute onset right gaze preference and left-sided weakness.  She was last seen well by her family at about 9:30 PM. She was watching television with her husband and when her son passed by at about 10 p.m. he noticed that she was not as responsive to him as she normally is. As he checked on her more he became concerned that she was unwell and activated EMS.  Regarding her pneumonia, her only symptoms were generalized fatigue none. She was not improving on the first round of antibiotics tried and therefore was broadened to Augmentin on Monday. She was overall slowly improving. Family and patient deny other symptoms such as headache, vision changes, fevers, chills, sweats, shortness of breath.  LKW: 9:15  PM tPA given?: Yes, time given 10:59 PM IA performed?: Yes Premorbid modified rankin scale:      0 - No symptoms  Initial NIH stroke scale was 11:  2 for answering month and age incorrectly,1 for gaze preference to the right, 2 4 and left facial droop, on 1 for left arm drift, 1 for right leg drift, 1 for left leg drift, 1 for reduced sensation on the left upper extremity, 1 for dysarthria, and 1 for extinction (gaze preference to the right with head turn to the right)  ROS: Limited review of systems is obtained from family/patient as above given patient's mild confusion and emergent medical condition  Past Medical History:  Diagnosis Date  . Atrial fibrillation ([place])    Prior treatment with amiodarone, held sinus rhythm and drug stopped  . Atrial flutter ([place])    Status post ablation prior to  2009  . Chest pain    Nuclear, 2004, no scar or ischemia, EF 77%  .  Ejection fraction    EF 70%, nuclear,  2004  //     . Fall    July, 2014  . H/O bladder repair surgery    Past Surgical History:  Procedure Laterality Date  . IR CT HEAD LTD  11/14/2019  . IR PERCUTANEOUS ART THROMBECTOMY/INFUSION INTRACRANIAL INC DIAG ANGIO  11/14/2019  . IR US GUIDE VASC ACCESS RIGHT  11/14/2019  . no surgical hx     No family history on file.  Social History:  reports that she has quit smoking. She has never used smokeless tobacco. She reports current alcohol use. She reports that she does not use drugs.  Exam: Current vital signs: Wt 66.4 kg   LMP  (LMP Unknown)   BMI 28.59 kg/m  Vital signs in last 24 hours: Weight:  [66.4 kg] 66.4 kg (10/19 2259)   Physical Exam  Constitutional: Appears well-developed and well-nourished.  Psych: Affect calm, appropriately reactive Eyes: No scleral injection HENT: No OP obstruction, good dentition MSK: no joint deformities.  Cardiovascular: Normal rate and regular rhythm.  Respiratory: Effort normal, non-labored breathing GI: Soft.  No distension. There is no tenderness.  Skin: WDI  Neuro: Mental Status: Patient is awake, alert, oriented to person, place, and situation, this stated she was 86 years old  No signs of aphasia or neglect to double simultaneous stimulation, though she does have a clear head turn and gaze  preference to the right Cranial Nerves: II: Visual Fields are full; on repeat testing she does report possible mild difficulty seeing in the left inferior quadrant. Pupils are equal, round, and reactive to light.  III,IV, VI: EOMI without ptosis or diploplia; can fully bury to the left although she does have a strong right gaze preference V: Facial sensation is symmetric to temperature VII: Facial movement is notable for mild right facial droop more noticeable with slight activation VIII: hearing is intact to voice X: Uvula elevates symmetrically XI: Shoulder shrug is symmetric. XII: tongue is  midline without atrophy or fasciculations.  Motor: Tone is normal. Bulk is normal. Her left arm has a rapid pronator drift though it does not hit the bed, her left leg are notably weaker than the right leg, though both legs drift towards the bed without touching the bed Sensory: Sensation is modestly reduced in the left upper extremity per patient's report Deep Tendon Reflexes: 2+ and symmetric in the biceps and patellae.  Plantars: Toes are downgoing bilaterally.  Cerebellar: Finger-to-nose and toe to hand are intact bilaterally within limits for weakness   I have reviewed labs in epic and the results pertinent to this consultation are: Normal CBC including normal platelets, Creatinine of 0. 83 with a GFR of greater than 60 Mild AST and ALT elevation  I have reviewed the images obtained: Dry head CT without acute process, aspects of 10 CTA with a proximal right PCA occlusion  Impression: There was initial concern for a right MCA syndrome based on preliminary examination. Right PCA occlusion affecting thalamus likely explains her "cortical" findings on examination. Lack of a clear visual field deficit may have been secondary to compensation by collateral circulation. Her R PCA stroke, is possibly cardioembolic given her known history of prior atrial fibrillation. Will need to clarify records on her prior atrial fibrillation as this may meet criteria for anticoagulation now without further work-up. At minimum will need a 30-day event monitor at discharge if anticoagulation is not felt to be fully indicated at this time. Will need MRI to determine final size of the stroke and will need to continue to monitor subarachnoid hemorrhage from thrombectomy to determine when it may be safe to start anticoagulation. Needs stroke work-up as below. She was not extubated and immediately postprocedure given slight subarachnoid hemorrhage and contrast staining seen postoperatively. Her initial exam  postprocedure was concerning, but improved with holding propofol for 30 minutes to the point that she was again following commands in all 4 extremities. She had worsened gaze preference to the right and was unable to fully look to the left, as well as having a weak right corneal.  Recommendations: # R PCA stroke - Stroke labs TSH, HgbA1c, fasting lipid panel - MRI brain  - MRA of the brain without contrast and MRA neck w/wo or CTA  - Frequent neuro checks - Echocardiogram - Carotid dopplers - Start ASA 24 hours after tPA if patient remains stable without significant bleeding  - Risk factor modification - Telemetry monitoring; 30 day event monitor on discharge if no arrythmias captured  - Blood pressure goal   - Post successful revascularization with minor subarachnoid hemorrhage: SBP 120 - 140 for 24 hours; - PT consult, OT consult, Speech consult - Stroke team to follow  # Pneumonia  - Continue home amox-clav [id number] BID, ordered tentatively for 7-day course which may need to be adjusted based on her clinical picture - escalation of antibiotics if needed per CCM - Consider  further work-up with contrasted chest CT given atypical symptoms, to rule out malignancy   Lesleigh Noe MD-PhD [facility] [phone]

## 2019-11-14 ENCOUNTER — Inpatient Hospital Stay (HOSPITAL_COMMUNITY): Payer: Medicare Other

## 2019-11-14 DIAGNOSIS — J9600 Acute respiratory failure, unspecified whether with hypoxia or hypercapnia: Secondary | ICD-10-CM | POA: Diagnosis not present

## 2019-11-14 DIAGNOSIS — I480 Paroxysmal atrial fibrillation: Secondary | ICD-10-CM

## 2019-11-14 DIAGNOSIS — I63431 Cerebral infarction due to embolism of right posterior cerebral artery: Secondary | ICD-10-CM | POA: Diagnosis not present

## 2019-11-14 DIAGNOSIS — I6389 Other cerebral infarction: Secondary | ICD-10-CM

## 2019-11-14 DIAGNOSIS — J9601 Acute respiratory failure with hypoxia: Secondary | ICD-10-CM | POA: Diagnosis not present

## 2019-11-14 DIAGNOSIS — I63531 Cerebral infarction due to unspecified occlusion or stenosis of right posterior cerebral artery: Secondary | ICD-10-CM | POA: Diagnosis present

## 2019-11-14 DIAGNOSIS — R1312 Dysphagia, oropharyngeal phase: Secondary | ICD-10-CM

## 2019-11-14 DIAGNOSIS — I639 Cerebral infarction, unspecified: Secondary | ICD-10-CM | POA: Diagnosis not present

## 2019-11-14 HISTORY — PX: IR US GUIDE VASC ACCESS RIGHT: IMG2390

## 2019-11-14 HISTORY — PX: IR CT HEAD LTD: IMG2386

## 2019-11-14 HISTORY — PX: IR PERCUTANEOUS ART THROMBECTOMY/INFUSION INTRACRANIAL INC DIAG ANGIO: IMG6087

## 2019-11-14 LAB — POCT I-STAT 7, (LYTES, BLD GAS, ICA,H+H)
Acid-base deficit: 4 mmol/L — ABNORMAL HIGH (ref 0.0–2.0)
Bicarbonate: 21 mmol/L (ref 20.0–28.0)
Calcium, Ion: 1.07 mmol/L — ABNORMAL LOW (ref 1.15–1.40)
HCT: 31 % — ABNORMAL LOW (ref 36.0–46.0)
Hemoglobin: 10.5 g/dL — ABNORMAL LOW (ref 12.0–15.0)
O2 Saturation: 99 %
Patient temperature: 98
Potassium: 3.1 mmol/L — ABNORMAL LOW (ref 3.5–5.1)
Sodium: 144 mmol/L (ref 135–145)
TCO2: 22 mmol/L (ref 22–32)
pCO2 arterial: 36.9 mmHg (ref 32.0–48.0)
pH, Arterial: 7.362 (ref 7.350–7.450)
pO2, Arterial: 143 mmHg — ABNORMAL HIGH (ref 83.0–108.0)

## 2019-11-14 LAB — TRIGLYCERIDES: Triglycerides: 127 mg/dL (ref <150)

## 2019-11-14 LAB — TSH: TSH: 2.314 u[IU]/mL (ref 0.350–4.500)

## 2019-11-14 LAB — LIPID PANEL
Cholesterol: 70 mg/dL (ref 0–200)
HDL: 19 mg/dL — ABNORMAL LOW (ref >40)
LDL Cholesterol: 26 mg/dL (ref 0–99)
Total CHOL/HDL Ratio: 3.7 RATIO
Triglycerides: 126 mg/dL (ref <150)
VLDL: 25 mg/dL (ref 0–40)

## 2019-11-14 LAB — ECHOCARDIOGRAM COMPLETE
AR max vel: 2.6 cm2
AV Area VTI: 2.69 cm2
AV Area mean vel: 2.61 cm2
AV Mean grad: 3 mmHg
AV Peak grad: 6.2 mmHg
Ao pk vel: 1.24 m/s
Height: 61 in
S' Lateral: 2.3 cm
Weight: 2342.17 oz

## 2019-11-14 LAB — GLUCOSE, CAPILLARY
Glucose-Capillary: 76 mg/dL (ref 70–99)
Glucose-Capillary: 85 mg/dL (ref 70–99)

## 2019-11-14 LAB — RESPIRATORY PANEL BY RT PCR (FLU A&B, COVID)
Influenza A by PCR: NEGATIVE
Influenza B by PCR: NEGATIVE
SARS Coronavirus 2 by RT PCR: NEGATIVE

## 2019-11-14 LAB — MRSA PCR SCREENING: MRSA by PCR: NEGATIVE

## 2019-11-14 MED ORDER — POTASSIUM CHLORIDE 10 MEQ/100ML IV SOLN
10.0000 meq | INTRAVENOUS | Status: AC
  Administered 2019-11-14 (×4): 10 meq via INTRAVENOUS
  Filled 2019-11-14: qty 100

## 2019-11-14 MED ORDER — ASPIRIN 81 MG PO CHEW
CHEWABLE_TABLET | ORAL | Status: AC
  Filled 2019-11-14: qty 1

## 2019-11-14 MED ORDER — PERFLUTREN LIPID MICROSPHERE
1.0000 mL | INTRAVENOUS | Status: AC | PRN
  Administered 2019-11-14: 3 mL via INTRAVENOUS
  Filled 2019-11-14: qty 10

## 2019-11-14 MED ORDER — FENTANYL 2500MCG IN NS 250ML (10MCG/ML) PREMIX INFUSION
25.0000 ug/h | INTRAVENOUS | Status: DC
  Administered 2019-11-14: 25 ug/h via INTRAVENOUS
  Filled 2019-11-14: qty 250

## 2019-11-14 MED ORDER — ONDANSETRON HCL 4 MG/2ML IJ SOLN
INTRAMUSCULAR | Status: DC | PRN
  Administered 2019-11-14: 4 mg via INTRAVENOUS

## 2019-11-14 MED ORDER — CLOPIDOGREL BISULFATE 300 MG PO TABS
ORAL_TABLET | ORAL | Status: AC
  Filled 2019-11-14: qty 1

## 2019-11-14 MED ORDER — PANTOPRAZOLE SODIUM 40 MG IV SOLR
40.0000 mg | INTRAVENOUS | Status: DC
  Administered 2019-11-14: 40 mg via INTRAVENOUS
  Filled 2019-11-14: qty 40

## 2019-11-14 MED ORDER — FAMOTIDINE IN NACL 20-0.9 MG/50ML-% IV SOLN
20.0000 mg | INTRAVENOUS | Status: DC
  Administered 2019-11-14: 20 mg via INTRAVENOUS
  Filled 2019-11-14: qty 50

## 2019-11-14 MED ORDER — PANTOPRAZOLE SODIUM 40 MG IV SOLR: 40.0000 mg | Freq: Every day | INTRAVENOUS | Status: DC

## 2019-11-14 MED ORDER — EPHEDRINE SULFATE-NACL 50-0.9 MG/10ML-% IV SOSY
PREFILLED_SYRINGE | INTRAVENOUS | Status: DC | PRN
  Administered 2019-11-14 (×2): 2.5 mg via INTRAVENOUS

## 2019-11-14 MED ORDER — ROCURONIUM BROMIDE 10 MG/ML (PF) SYRINGE
PREFILLED_SYRINGE | INTRAVENOUS | Status: DC | PRN
  Administered 2019-11-14: 20 mg via INTRAVENOUS
  Administered 2019-11-14: 50 mg via INTRAVENOUS

## 2019-11-14 MED ORDER — SODIUM CHLORIDE 0.9 % IV SOLN
INTRAVENOUS | Status: DC
  Administered 2019-11-14: 50 mL/h via INTRAVENOUS

## 2019-11-14 MED ORDER — CHLORHEXIDINE GLUCONATE CLOTH 2 % EX PADS
6.0000 | MEDICATED_PAD | Freq: Every day | CUTANEOUS | Status: DC
  Administered 2019-11-14 – 2019-11-15 (×2): 6 via TOPICAL

## 2019-11-14 MED ORDER — ACETAMINOPHEN 650 MG RE SUPP: 650.0000 mg | RECTAL | Status: DC | PRN

## 2019-11-14 MED ORDER — AMOXICILLIN-POT CLAVULANATE 875-125 MG PO TABS
1.0000 | ORAL_TABLET | Freq: Two times a day (BID) | ORAL | Status: AC
  Administered 2019-11-14 – 2019-11-20 (×14): 1 via ORAL
  Filled 2019-11-14 (×14): qty 1

## 2019-11-14 MED ORDER — TIROFIBAN HCL IN NACL 5-0.9 MG/100ML-% IV SOLN
INTRAVENOUS | Status: AC
  Filled 2019-11-14: qty 100

## 2019-11-14 MED ORDER — ESMOLOL HCL 100 MG/10ML IV SOLN
INTRAVENOUS | Status: DC | PRN
  Administered 2019-11-14 (×2): 30 mg via INTRAVENOUS

## 2019-11-14 MED ORDER — SODIUM CHLORIDE 0.9 % IV SOLN: INTRAVENOUS | Status: DC | PRN

## 2019-11-14 MED ORDER — IOHEXOL 240 MG/ML SOLN
INTRAMUSCULAR | Status: AC
  Filled 2019-11-14: qty 200

## 2019-11-14 MED ORDER — POLYETHYLENE GLYCOL 3350 17 G PO PACK
17.0000 g | PACK | Freq: Every day | ORAL | Status: DC
  Administered 2019-11-16 – 2019-11-23 (×6): 17 g via ORAL
  Filled 2019-11-14 (×7): qty 1

## 2019-11-14 MED ORDER — PROPOFOL 1000 MG/100ML IV EMUL
5.0000 ug/kg/min | INTRAVENOUS | Status: DC
  Administered 2019-11-14: 20 ug/kg/min via INTRAVENOUS
  Filled 2019-11-14: qty 100

## 2019-11-14 MED ORDER — TICAGRELOR 90 MG PO TABS
ORAL_TABLET | ORAL | Status: AC
  Filled 2019-11-14: qty 2

## 2019-11-14 MED ORDER — IOHEXOL 300 MG/ML  SOLN
50.0000 mL | Freq: Once | INTRAMUSCULAR | Status: AC | PRN
  Administered 2019-11-14: 25 mL via INTRA_ARTERIAL

## 2019-11-14 MED ORDER — ACETAMINOPHEN 160 MG/5ML PO SOLN: 650.0000 mg | ORAL | Status: DC | PRN

## 2019-11-14 MED ORDER — FENTANYL 2500MCG IN NS 250ML (10MCG/ML) PREMIX INFUSION: 25.0000 ug/h | INTRAVENOUS | Status: DC

## 2019-11-14 MED ORDER — DOCUSATE SODIUM 50 MG/5ML PO LIQD
100.0000 mg | Freq: Two times a day (BID) | ORAL | Status: DC
  Administered 2019-11-14 – 2019-11-16 (×4): 100 mg via ORAL
  Filled 2019-11-14 (×4): qty 10

## 2019-11-14 MED ORDER — PHENYLEPHRINE HCL-NACL 10-0.9 MG/250ML-% IV SOLN
INTRAVENOUS | Status: DC | PRN
  Administered 2019-11-14: 25 ug/min via INTRAVENOUS

## 2019-11-14 MED ORDER — CLEVIDIPINE BUTYRATE 0.5 MG/ML IV EMUL: 0.0000 mg/h | INTRAVENOUS | Status: DC

## 2019-11-14 MED ORDER — LABETALOL HCL 5 MG/ML IV SOLN
INTRAVENOUS | Status: DC | PRN
  Administered 2019-11-14: 5 mg via INTRAVENOUS

## 2019-11-14 MED ORDER — STROKE: EARLY STAGES OF RECOVERY BOOK: Freq: Once | Status: DC

## 2019-11-14 MED ORDER — ALBUMIN HUMAN 5 % IV SOLN: INTRAVENOUS | Status: DC | PRN

## 2019-11-14 MED ORDER — CLEVIDIPINE BUTYRATE 0.5 MG/ML IV EMUL
INTRAVENOUS | Status: DC | PRN
  Administered 2019-11-14: 2 mg/h via INTRAVENOUS

## 2019-11-14 MED ORDER — LIDOCAINE 2% (20 MG/ML) 5 ML SYRINGE
INTRAMUSCULAR | Status: DC | PRN
  Administered 2019-11-13: 50 mg via INTRAVENOUS

## 2019-11-14 MED ORDER — CANGRELOR TETRASODIUM 50 MG IV SOLR
INTRAVENOUS | Status: AC
  Filled 2019-11-14: qty 50

## 2019-11-14 MED ORDER — CHLORHEXIDINE GLUCONATE 0.12% ORAL RINSE (MEDLINE KIT)
15.0000 mL | Freq: Two times a day (BID) | OROMUCOSAL | Status: DC
  Administered 2019-11-14 – 2019-11-23 (×13): 15 mL via OROMUCOSAL

## 2019-11-14 MED ORDER — ONDANSETRON HCL 4 MG/2ML IJ SOLN: 4.0000 mg | Freq: Four times a day (QID) | INTRAMUSCULAR | Status: DC | PRN

## 2019-11-14 MED ORDER — SUCCINYLCHOLINE CHLORIDE 200 MG/10ML IV SOSY
PREFILLED_SYRINGE | INTRAVENOUS | Status: DC | PRN
  Administered 2019-11-13: 100 mg via INTRAVENOUS

## 2019-11-14 MED ORDER — PROPOFOL 10 MG/ML IV BOLUS
INTRAVENOUS | Status: DC | PRN
  Administered 2019-11-13: 100 mg via INTRAVENOUS
  Administered 2019-11-14: 50 mg via INTRAVENOUS
  Administered 2019-11-14 (×2): 20 mg via INTRAVENOUS
  Administered 2019-11-14: 50 mg via INTRAVENOUS

## 2019-11-14 MED ORDER — IOHEXOL 300 MG/ML  SOLN
150.0000 mL | Freq: Once | INTRAMUSCULAR | Status: AC | PRN
  Administered 2019-11-14: 75 mL via INTRA_ARTERIAL

## 2019-11-14 MED ORDER — SENNOSIDES-DOCUSATE SODIUM 8.6-50 MG PO TABS
2.0000 | ORAL_TABLET | Freq: Every evening | ORAL | Status: DC | PRN
  Administered 2019-11-17 – 2019-11-20 (×2): 2 via ORAL
  Filled 2019-11-14 (×2): qty 2

## 2019-11-14 MED ORDER — CLEVIDIPINE BUTYRATE 0.5 MG/ML IV EMUL
0.0000 mg/h | INTRAVENOUS | Status: DC
  Administered 2019-11-14: 4 mg/h via INTRAVENOUS
  Filled 2019-11-14: qty 50

## 2019-11-14 MED ORDER — ORAL CARE MOUTH RINSE
15.0000 mL | OROMUCOSAL | Status: DC
  Administered 2019-11-14 – 2019-11-16 (×14): 15 mL via OROMUCOSAL

## 2019-11-14 MED ORDER — SODIUM CHLORIDE 0.9 % IV SOLN: INTRAVENOUS | Status: DC

## 2019-11-14 MED ORDER — FENTANYL CITRATE (PF) 100 MCG/2ML IJ SOLN
INTRAMUSCULAR | Status: DC | PRN
  Administered 2019-11-14: 50 ug via INTRAVENOUS
  Administered 2019-11-14 (×2): 25 ug via INTRAVENOUS

## 2019-11-14 MED ORDER — PHENYLEPHRINE 40 MCG/ML (10ML) SYRINGE FOR IV PUSH (FOR BLOOD PRESSURE SUPPORT)
PREFILLED_SYRINGE | INTRAVENOUS | Status: DC | PRN
  Administered 2019-11-14: 80 ug via INTRAVENOUS
  Administered 2019-11-14: 40 ug via INTRAVENOUS
  Administered 2019-11-14: 120 ug via INTRAVENOUS
  Administered 2019-11-14 (×2): 80 ug via INTRAVENOUS

## 2019-11-14 MED ORDER — CLEVIDIPINE BUTYRATE 0.5 MG/ML IV EMUL
0.0000 mg/h | INTRAVENOUS | Status: DC
  Filled 2019-11-14: qty 50

## 2019-11-14 MED ORDER — EPTIFIBATIDE 20 MG/10ML IV SOLN
INTRAVENOUS | Status: AC
  Filled 2019-11-14: qty 10

## 2019-11-14 MED ORDER — FENTANYL CITRATE (PF) 100 MCG/2ML IJ SOLN
INTRAMUSCULAR | Status: AC
  Filled 2019-11-14: qty 2

## 2019-11-14 MED ORDER — LABETALOL HCL 5 MG/ML IV SOLN
10.0000 mg | INTRAVENOUS | Status: DC | PRN
  Administered 2019-11-15 (×2): 10 mg via INTRAVENOUS
  Filled 2019-11-14 (×2): qty 4

## 2019-11-14 MED ORDER — ACETAMINOPHEN 325 MG PO TABS: 650.0000 mg | ORAL_TABLET | ORAL | Status: DC | PRN

## 2019-11-14 MED ORDER — VERAPAMIL HCL 2.5 MG/ML IV SOLN
INTRAVENOUS | Status: AC
  Filled 2019-11-14: qty 2

## 2019-11-14 MED ORDER — PROPOFOL 500 MG/50ML IV EMUL
INTRAVENOUS | Status: DC | PRN
  Administered 2019-11-14: 60 ug/kg/min via INTRAVENOUS

## 2019-11-14 NOTE — Procedures (Signed)
Neuro-Interventional Radiology  Post Cerebral Angiogram Procedure Note  Operator:    Dr. 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   Baseline mRS:  0 Site of occlusion:  Right PCA   Procedure: US guided right CFA access Cervical & Cerebral Angiogram Mechanical Thrombectomy Deployment of Angioseal Flat Panel CT in NIR  First Pass Device:  Solitaire 3x20  Result   TICI 3  Findings:    TICI 0 to start TICI 3 at completion Flat panel CT: Contrast stain in the midbrain vs interpeduncular SAH   Anesthesia:   GETA  EBL:    90XY     Complication:  None   Medication: IV tPA administered?: yes IA Medication:  no  Recommendations: - right hip straight overnight - Goal SBP 120-140.   - Frequent NV checks - Formal CT now given the questionable SAH.  - Patient will remain intubated. - further CT vs MRI imaging discretion of Neurology - NIR to follow    Signed,  Dulcy Fanny. Earleen Newport, DO

## 2019-11-14 NOTE — Discharge Instructions (Addendum)
Femoral Site Care This sheet gives you information about how to care for yourself after your procedure. Your health care provider may also give you more specific instructions. If you have problems or questions, contact your health care provider. What can I expect after the procedure? After the procedure, it is common to have:  Bruising that usually fades within 1-2 weeks.  Tenderness at the site. Follow these instructions at home: Wound care 1. Follow instructions from your health care provider about how to take care of your insertion site. Make sure you: ? Wash your hands with soap and water before you change your bandage (dressing). If soap and water are not available, use hand sanitizer. ? Change your dressing as directed- pressure dressing removed 24 hours post-procedure (and switch for bandaid), bandaid removed 72 hours post-procedure 2. Do not take baths, swim, or use a hot tub for 7 days post-procedure. 3. You may shower 48 hours after the procedure or as told by your health care provider. ? Gently wash the site with plain soap and water. ? Pat the area dry with a clean towel. ? Do not rub the site. This may cause bleeding. 4. Check your site every day for signs of infection. Check for: ? Redness, swelling, or pain. ? Fluid or blood. ? Warmth. ? Pus or a bad smell. Activity  Do not stoop, bend, or lift anything that is heavier than 10 lb (4.5 kg) for 2 weeks post-procedure.  Do not drive self for 2 weeks post-procedure. Contact a health care provider if you have:  A fever or chills.  You have redness, swelling, or pain around your insertion site. Get help right away if:  The catheter insertion area swells very fast.  You pass out.  You suddenly start to sweat or your skin gets clammy.  The catheter insertion area is bleeding, and the bleeding does not stop when you hold steady pressure on the area.  The area near or just beyond the catheter insertion site becomes  pale, cool, tingly, or numb. These symptoms may represent a serious problem that is an emergency. Do not wait to see if the symptoms will go away. Get medical help right away. Call your local emergency services (911 in the [place] Do not drive yourself to the hospital.  This information is not intended to replace advice given to you by your health care provider. Make sure you discuss any questions you have with your health care provider. Document Revised: 01/24/2017 Document Reviewed: 01/24/2017 Elsevier Patient Education  2020 [employer]  Information on my medicine - ELIQUIS (apixaban)  Why was Eliquis prescribed for you? Eliquis was prescribed for you to reduce the risk of a blood clot forming that can cause a stroke if you have a medical condition called atrial fibrillation (a type of irregular heartbeat).  What do You need to know about Eliquis ? Take your Eliquis TWICE DAILY - one tablet in the morning and one tablet in the evening with or without food. If you have difficulty swallowing the tablet whole please discuss with your pharmacist how to take the medication safely.  Take Eliquis exactly as prescribed by your doctor and DO NOT stop taking Eliquis without talking to the doctor who prescribed the medication.  Stopping may increase your risk of developing a stroke.  Refill your prescription before you run out.  After discharge, you should have regular check-up appointments with your healthcare provider that is prescribing your Eliquis.  In the future your dose   may need to be changed if your kidney function or weight changes by a significant amount or as you get older.  What do you do if you miss a dose? If you miss a dose, take it as soon as you remember on the same day and resume taking twice daily.  Do not take more than one dose of ELIQUIS at the same time to make up a missed dose.  Important Safety Information A possible side effect of Eliquis is bleeding. You should  call your healthcare provider right away if you experience any of the following: ? Bleeding from an injury or your nose that does not stop. ? Unusual colored urine (red or dark brown) or unusual colored stools (red or black). ? Unusual bruising for unknown reasons. ? A serious fall or if you hit your head (even if there is no bleeding).  Some medicines may interact with Eliquis and might increase your risk of bleeding or clotting while on Eliquis. To help avoid this, consult your healthcare provider or pharmacist prior to using any new prescription or non-prescription medications, including herbals, vitamins, non-steroidal anti-inflammatory drugs (NSAIDs) and supplements.  This website has more information on Eliquis (apixaban): http://www.eliquis.com/eliquis/home  

## 2019-11-14 NOTE — Anesthesia Procedure Notes (Signed)
Arterial Line Insertion Start/End10/20/2021 12:15 AM, 11/14/2019 12:20 AM Performed by: Jearld Pies, CRNA, CRNA  Patient location: OOR procedure area. Emergency situation Patient sedated Left, radial was placed Catheter size: 20 G Hand hygiene performed  and Seldinger technique used Allen's test indicative of satisfactory collateral circulation Attempts: 1 Procedure performed without using ultrasound guided technique. Following insertion, dressing applied and Biopatch. Post procedure assessment: normal  Patient tolerated the procedure well with no immediate complications.

## 2019-11-14 NOTE — ED Notes (Addendum)
TPA was started and administered @2257  TPA finshed at 2357 and 250cc normal saline bag hung after as a flush

## 2019-11-14 NOTE — Progress Notes (Signed)
Patient came back from CT without incident. Pt is alert and follows commands. Pt passed bedside swallowing test. Augmentin po med was crushed and mixed with apple sauce and administered. Patient cleared throat after swallowing meds. No distress at this time. Will continue to assess.

## 2019-11-14 NOTE — Progress Notes (Signed)
Unable to remove left gold wedding ring and wedding bands (x2). MD team aware.

## 2019-11-14 NOTE — Progress Notes (Signed)
Referring Physician(s): Code stroke- Bhagat, Renfrow (neurology)  Supervising Physician: Corrie Mckusick  Patient Status:  [facility] - In-pt  Chief Complaint: None- intubated without sedation  Subjective:  History of acute CVA s/p cerebral arteriogram with emergent mechanical thrombectomy of right PCA occlusion achieving a TICI 3 revascularization via right femoral approach 11/14/2019 by Dr. Earleen Newport. Patient awake and alert laying in bed, intubated without sedation. Son at bedside. She follows simple commands. Can spontaneously move all extremities with weakness of left side. Right femoral puncture site c/d/i.  MR/MRA brain/head this AM: 1. Acute infarct right thalamus with associated mild hemorrhage as noted on recent CT. There is also mild subarachnoid hemorrhage in the interpeduncular cistern unchanged from CT. Additional small areas of acute infarct in the right cerebellum and right frontal cortex. 2. Moderate atrophy. Chronic microvascular ischemic change in the white matter. 3. Right PCA patent post thrombectomy. There is a mild stenosis in the right PCA at the P1/ P2 junction.   Allergies: Bee venom, Mixed vespid venom, Codeine, Sulfa antibiotics, and Erythromycin  Medications: Prior to Admission medications   Medication Sig Start Date End Date Taking? Authorizing Provider  amoxicillin-clavulanate (AUGMENTIN) 875-125 MG tablet Take 1 tablet by mouth 2 (two) times daily. 11/12/19  Yes [provider]  aspirin 81 MG tablet Take 1 tablet (81 mg total) by mouth daily. 01/08/13  Yes Carlena Bjornstad, MD  atorvastatin (LIPITOR) 10 MG tablet Take 10 mg by mouth daily.   Yes [provider]  BIOTIN PO Take 1 tablet by mouth daily.    Yes [provider]  CALCIUM PO Take 1 tablet by mouth daily.    Yes [provider]  Cholecalciferol (VITAMIN D3) 50 MCG (2000 UT) CAPS Take 2,000 Units by mouth daily.   Yes [provider]  Coenzyme Q10 (CO  Q-10 PO) Take 1 tablet by mouth daily.    Yes [provider]  metoprolol succinate (TOPROL-XL) 50 MG 24 hr tablet Take 50 mg by mouth daily. Take with or immediately following a meal.   Yes [provider]  EPINEPHrine 0.3 mg/0.3 mL IJ SOAJ injection Inject 0.3 mg into the muscle as needed for anaphylaxis.  08/04/18   [provider]  Investigational - Study Medication Additional Study Details    [provider]  trimethoprim (TRIMPEX) 100 MG tablet Take 100 mg by mouth as directed. Patient not taking: Reported on 11/14/2019    [provider]     Vital Signs: BP (!) 100/56 (BP Location: Right Arm)   Pulse 84   Temp (!) 97 F (36.1 C) (Axillary)   Resp 18   Ht _0  (1.549 m)   Wt 146 lb 6.2 oz (66.4 kg)   LMP  (LMP Unknown)   SpO2 100%   BMI 27.66 kg/m   Physical Exam Vitals and nursing note reviewed.  Constitutional:      General: She is not in acute distress.    Comments: Intubated without sedation.  Pulmonary:     Effort: Pulmonary effort is normal. No respiratory distress.     Comments: Intubated without sedation. Skin:    General: Skin is warm and dry.     Comments: Right femoral puncture site soft without active bleeding or hematoma.  Neurological:     Mental Status: She is alert.     Comments: Intubated without sedation. Awake and follows simple commands. PERRL bilaterally EOMs intact bilaterally without nystagmus or subjective diplopia. Can spontaneously move all  extremities with weakness of left side. No pronator drift. Distal pulses (DPs) 2+ bilaterally.     Imaging: CT Code Stroke CTA Head W/WO contrast  Result Date: 11/13/2019 CLINICAL DATA:  86 year old female code stroke presentation. Presentation suspicious for LV0, receiving IV tPA. History also of recent outpatient treatment for pneumonia. EXAM: CT ANGIOGRAPHY HEAD AND NECK TECHNIQUE: Multidetector CT imaging of the head and neck was performed using the  standard protocol during bolus administration of intravenous contrast. Multiplanar CT image reconstructions and MIPs were obtained to evaluate the vascular anatomy. Carotid stenosis measurements (when applicable) are obtained utilizing NASCET criteria, using the distal internal carotid diameter as the denominator. CONTRAST:  60 mL Omnipaque 350. COMPARISON:  Plain head CT 2253 hours today. FINDINGS: CTA NECK Skeleton: Widespread advanced cervical spine degeneration. No acute osseous abnormality identified. Upper chest: Small to moderate layering left pleural effusion. Moderate to severe emphysema in the visible upper lungs. Patchy additional peripheral left upper lobe opacity. No superior mediastinal lymphadenopathy. Other neck: No acute findings. Aortic arch: Mild to moderate arch atherosclerosis. Three vessel arch configuration. Right carotid system: Brachiocephalic artery and right CCA origin within normal limits. Mild plaque proximal to the bifurcation without stenosis. Mild soft plaque at the medial right ICA origin without stenosis. Partially retropharyngeal course of the right ICA without stenosis to the skull base. Left carotid system: Mild plaque at the left CCA origin and proximal to the bifurcation without stenosis. Negative bifurcation. Tortuous left ICA with a partially retropharyngeal course, no stenosis. Vertebral arteries: Proximal right subclavian artery plaque without stenosis. Normal right vertebral artery origin. Right vertebral artery is patent and within normal limits to the skull base. Mild to moderate proximal left subclavian artery plaque without significant stenosis. Normal left vertebral artery origin. Tortuous left V1 segment. Left vertebral artery is fairly codominant and patent to the skull base without stenosis. CTA HEAD Posterior circulation: Codominant distal vertebral arteries are patent to the vertebrobasilar junction without plaque or stenosis. The a ICAs may be dominant. Patent  basilar artery without stenosis. Patent SCA and left PCA origins. However, the right PCA is occluded just beyond its origin (series 11, image 26) with poor reconstitution distally. The left PCA P2 and P3 segments are mildly irregular with mild stenosis. Anterior circulation: Both ICA siphons are patent. Mild calcified plaque on the left. Mild left supraclinoid ICA stenosis. Similar mild calcified plaque on the right and mild supraclinoid stenosis. Patent carotid termini. Posterior communicating arteries are diminutive or absent. Patent MCA and ACA origins. The left ACA is dominant and the right is diminutive with subsequent azygos type ACA anatomy. No definite ACA occlusion. Left MCA M1 segment and trifurcation are patent without stenosis. Right MCA M1 segment and bifurcation are patent without stenosis. Bilateral MCA branches appear patent with mild irregularity. Venous sinuses: Early contrast timing, not evaluated. Anatomic variants: Dominant left ACA with azygos type ACA anatomy. Review of the MIP images confirms the above findings IMPRESSION: 1. Positive for occlusion of the Right PCA origin. 2. No other large vessel occlusion, and generally mild for age atherosclerosis in the head and neck. 3. Emphysema (ICD10-J43.9) with layering left pleural effusion, patchy left upper lobe opacity compatible with pneumonia in this setting. 4. Aortic Atherosclerosis (ICD10-I70.0) Preliminary report of the above discussed by telephone with Dr. Lesleigh Noe on 11/13/2019 at 2314 hours. Electronically Signed   By: Genevie Ann M.D.   On: 11/13/2019 23:25   CT HEAD WO CONTRAST  Result Date: 11/14/2019 CLINICAL DATA:  86 year old  female code stroke presentation with right PCA occlusion. Status post Neuro Interventional endovascular revascularization. EXAM: CT HEAD WITHOUT CONTRAST TECHNIQUE: Contiguous axial images were obtained from the base of the skull through the vertex without intravenous contrast. COMPARISON:  Head CT 2253  hours 11/13/2019. FINDINGS: Brain: There is a small subarachnoid collection of blood and contrast in the interpeduncular cistern (series 3, image 13). The other basilar cisterns remain normal. No intraventricular blood. Small and indistinct 10 mm area of contrast staining versus petechial hemorrhage in the medial right thalamus (series 3, image 16). Asymmetric right thalamic hypodense heterogeneity elsewhere appears slightly increased from the presentation CT. No cytotoxic edema identified elsewhere in the right PCA territory. Stable gray-white matter differentiation elsewhere. No other abnormal enhancement or intracranial blood identified. Vascular: Residual intravascular contrast. Skull: No acute osseous abnormality identified. Sinuses/Orbits: Small fluid levels and mild mucosal thickening now in the sinuses. Stable mild mastoid effusions. Other: Intubated. Small volume fluid in the pharynx. No acute orbit or scalp soft tissue findings. IMPRESSION: 1. Status post Right PCA endovascular revascularization with trace subarachnoid blood/contrast in the interpeduncular cistern. 2. Small 10 mm area of contrast staining versus petechial hemorrhage in the medial right thalamus. And question subtle increased right thalamic hypodensity elsewhere, but no other Right PCA territory infarct changes are identified. These results were communicated to Dr. Curly Shores at 2:25 am on 11/14/2019 by text page via the [facility] messaging system. Electronically Signed   By: Genevie Ann M.D.   On: 11/14/2019 02:26   CT Code Stroke CTA Neck W/WO contrast  Result Date: 11/13/2019 CLINICAL DATA:  86 year old female code stroke presentation. Presentation suspicious for LV0, receiving IV tPA. History also of recent outpatient treatment for pneumonia. EXAM: CT ANGIOGRAPHY HEAD AND NECK TECHNIQUE: Multidetector CT imaging of the head and neck was performed using the standard protocol during bolus administration of intravenous contrast. Multiplanar CT  image reconstructions and MIPs were obtained to evaluate the vascular anatomy. Carotid stenosis measurements (when applicable) are obtained utilizing NASCET criteria, using the distal internal carotid diameter as the denominator. CONTRAST:  60 mL Omnipaque 350. COMPARISON:  Plain head CT 2253 hours today. FINDINGS: CTA NECK Skeleton: Widespread advanced cervical spine degeneration. No acute osseous abnormality identified. Upper chest: Small to moderate layering left pleural effusion. Moderate to severe emphysema in the visible upper lungs. Patchy additional peripheral left upper lobe opacity. No superior mediastinal lymphadenopathy. Other neck: No acute findings. Aortic arch: Mild to moderate arch atherosclerosis. Three vessel arch configuration. Right carotid system: Brachiocephalic artery and right CCA origin within normal limits. Mild plaque proximal to the bifurcation without stenosis. Mild soft plaque at the medial right ICA origin without stenosis. Partially retropharyngeal course of the right ICA without stenosis to the skull base. Left carotid system: Mild plaque at the left CCA origin and proximal to the bifurcation without stenosis. Negative bifurcation. Tortuous left ICA with a partially retropharyngeal course, no stenosis. Vertebral arteries: Proximal right subclavian artery plaque without stenosis. Normal right vertebral artery origin. Right vertebral artery is patent and within normal limits to the skull base. Mild to moderate proximal left subclavian artery plaque without significant stenosis. Normal left vertebral artery origin. Tortuous left V1 segment. Left vertebral artery is fairly codominant and patent to the skull base without stenosis. CTA HEAD Posterior circulation: Codominant distal vertebral arteries are patent to the vertebrobasilar junction without plaque or stenosis. The a ICAs may be dominant. Patent basilar artery without stenosis. Patent SCA and left PCA origins. However, the right  PCA  is occluded just beyond its origin (series 11, image 26) with poor reconstitution distally. The left PCA P2 and P3 segments are mildly irregular with mild stenosis. Anterior circulation: Both ICA siphons are patent. Mild calcified plaque on the left. Mild left supraclinoid ICA stenosis. Similar mild calcified plaque on the right and mild supraclinoid stenosis. Patent carotid termini. Posterior communicating arteries are diminutive or absent. Patent MCA and ACA origins. The left ACA is dominant and the right is diminutive with subsequent azygos type ACA anatomy. No definite ACA occlusion. Left MCA M1 segment and trifurcation are patent without stenosis. Right MCA M1 segment and bifurcation are patent without stenosis. Bilateral MCA branches appear patent with mild irregularity. Venous sinuses: Early contrast timing, not evaluated. Anatomic variants: Dominant left ACA with azygos type ACA anatomy. Review of the MIP images confirms the above findings IMPRESSION: 1. Positive for occlusion of the Right PCA origin. 2. No other large vessel occlusion, and generally mild for age atherosclerosis in the head and neck. 3. Emphysema (ICD10-J43.9) with layering left pleural effusion, patchy left upper lobe opacity compatible with pneumonia in this setting. 4. Aortic Atherosclerosis (ICD10-I70.0) Preliminary report of the above discussed by telephone with Dr. Lesleigh Noe on 11/13/2019 at 2314 hours. Electronically Signed   By: Genevie Ann M.D.   On: 11/13/2019 23:25   MR ANGIO HEAD WO CONTRAST  Result Date: 11/14/2019 CLINICAL DATA:  Stroke follow-up. Right posterior cerebral artery thrombectomy 11/14/2019 EXAM: MRI HEAD WITHOUT CONTRAST MRA HEAD WITHOUT CONTRAST TECHNIQUE: Multiplanar, multiecho pulse sequences of the brain and surrounding structures were obtained without intravenous contrast. Angiographic images of the head were obtained using MRA technique without contrast. COMPARISON:  CT head 11/14/2019 FINDINGS: MRI  HEAD FINDINGS Brain: Restricted diffusion right thalamus compatible with acute infarct. Additional small areas of acute infarct in the right inferior and right superior cerebellum. Small area of acute infarct in the right frontal cortex over the convexity. Small amount of hemorrhage in the right thalamus and in the subarachnoid space in the interpeduncular cistern as noted on CT. Generalized atrophy. Ventricular enlargement is stable. Periventricular deep white matter hyperintensities bilaterally compatible with chronic ischemia. No mass lesion. Vascular: Normal arterial flow voids. Skull and upper cervical spine: No focal lesion. Sinuses/Orbits: Mucosal edema paranasal sinuses with air-fluid level right maxillary sinus. Bilateral cataract extraction Other: None MRA HEAD FINDINGS Both vertebral arteries patent to the basilar without stenosis. PICA not visualized. AICA patent bilaterally. Basilar widely patent. Superior cerebellar and posterior cerebral arteries patent bilaterally. Mild stenosis right P1/P2 junction with good distal flow in the right posterior cerebral artery post thrombectomy. Left PCA widely patent. Internal carotid artery widely patent bilaterally. Anterior and middle cerebral arteries widely patent without stenosis or large vessel occlusion. Negative for aneurysm. IMPRESSION: Acute infarct right thalamus with associated mild hemorrhage as noted on recent CT. There is also mild subarachnoid hemorrhage in the interpeduncular cistern unchanged from CT. Additional small areas of acute infarct in the right cerebellum and right frontal cortex. Moderate atrophy. Chronic microvascular ischemic change in the white matter. Right PCA patent post thrombectomy. There is a mild stenosis in the right PCA at the P1/ P2 junction. Electronically Signed   By: Franchot Gallo M.D.   On: 11/14/2019 08:26   MR BRAIN WO CONTRAST  Result Date: 11/14/2019 CLINICAL DATA:  Stroke follow-up. Right posterior cerebral  artery thrombectomy 11/14/2019 EXAM: MRI HEAD WITHOUT CONTRAST MRA HEAD WITHOUT CONTRAST TECHNIQUE: Multiplanar, multiecho pulse sequences of the brain and surrounding structures were obtained  without intravenous contrast. Angiographic images of the head were obtained using MRA technique without contrast. COMPARISON:  CT head 11/14/2019 FINDINGS: MRI HEAD FINDINGS Brain: Restricted diffusion right thalamus compatible with acute infarct. Additional small areas of acute infarct in the right inferior and right superior cerebellum. Small area of acute infarct in the right frontal cortex over the convexity. Small amount of hemorrhage in the right thalamus and in the subarachnoid space in the interpeduncular cistern as noted on CT. Generalized atrophy. Ventricular enlargement is stable. Periventricular deep white matter hyperintensities bilaterally compatible with chronic ischemia. No mass lesion. Vascular: Normal arterial flow voids. Skull and upper cervical spine: No focal lesion. Sinuses/Orbits: Mucosal edema paranasal sinuses with air-fluid level right maxillary sinus. Bilateral cataract extraction Other: None MRA HEAD FINDINGS Both vertebral arteries patent to the basilar without stenosis. PICA not visualized. AICA patent bilaterally. Basilar widely patent. Superior cerebellar and posterior cerebral arteries patent bilaterally. Mild stenosis right P1/P2 junction with good distal flow in the right posterior cerebral artery post thrombectomy. Left PCA widely patent. Internal carotid artery widely patent bilaterally. Anterior and middle cerebral arteries widely patent without stenosis or large vessel occlusion. Negative for aneurysm. IMPRESSION: Acute infarct right thalamus with associated mild hemorrhage as noted on recent CT. There is also mild subarachnoid hemorrhage in the interpeduncular cistern unchanged from CT. Additional small areas of acute infarct in the right cerebellum and right frontal cortex. Moderate  atrophy. Chronic microvascular ischemic change in the white matter. Right PCA patent post thrombectomy. There is a mild stenosis in the right PCA at the P1/ P2 junction. Electronically Signed   By: Franchot Gallo M.D.   On: 11/14/2019 08:26   IR CT Head Ltd  Result Date: 11/14/2019 INDICATION: 86 year old female presents for mechanical thrombectomy with emergent large vessel occlusion involving the right posterior cerebral artery EXAM: ULTRASOUND-GUIDED ACCESS RIGHT COMMON FEMORAL ARTERY CERVICAL AND CEREBRAL ANGIOGRAM MECHANICAL THROMBECTOMY RIGHT PCA ANGIO-SEAL FOR HEMOSTASIS COMPARISON:  CT imaging same day MEDICATIONS: None ANESTHESIA/SEDATION: The anesthesia team was present to provide general endotracheal tube anesthesia and for patient monitoring during the procedure. Intubation was performed in negative pressure [place] in neuro IR holding. Left radial arterial line was performed by the anesthesia team. Interventional neuro radiology nursing staff was also present. CONTRAST:  100 cc contrast FLUOROSCOPY TIME:  Fluoroscopy Time: 26 minutes 18 seconds (1961 mGy). COMPLICATIONS: SIR Level A - No therapy, no consequence. TECHNIQUE: Informed written consent was obtained from the patient's family after a thorough discussion of the procedural risks, benefits and alternatives. Specific risks discussed include: Bleeding, infection, contrast reaction, kidney injury/failure, need for further procedure/surgery, arterial injury or dissection, embolization to new territory, intracranial hemorrhage (10-15% risk), neurologic deterioration, cardiopulmonary collapse, death. All questions were addressed. Maximal Sterile Barrier Technique was utilized including during the procedure including caps, mask, sterile gowns, sterile gloves, sterile drape, hand hygiene and skin antiseptic. A timeout was performed prior to the initiation of the procedure. The anesthesia team was present to provide general endotracheal tube anesthesia  and for patient monitoring during the procedure. Interventional neuro radiology nursing staff was also present. FINDINGS: Initial Findings: Left subclavian artery: Normal course caliber and contour. Left internal mammary artery is patent. Costo cervical trunk and thyrocervical trunk are patent. Left vertebral artery: No significant atherosclerotic changes at the origin of the left vertebral artery. Tortuous course of the cervical vertebral artery without significant stenosis. Normal course caliber and contour of the intradural vertebral artery with no significant stenosis or atherosclerotic plaque. Basilar artery:  Normal course caliber and contour. Patent right posteroinferior cerebellar artery. There appears to be a left-sided PICA/AICA variant Reflux of the distal right vertebral artery at the junction. Bilateral anterior inferior cerebellar arteries are patent. Bilateral superior cerebellar arteries are patent. Right PCA: Occluded just after the origin Left PCA: Normal course caliber and contour. Completion Findings: Right PCA: Complete restoration of flow after first pass, TICI 3. Baseline PCA flow: TICI 0 Final PCA flow: TICI 3 PROCEDURE: The anesthesia team was present to provide general endotracheal tube anesthesia and for patient monitoring during the procedure. Intubation was performed in negative pressure [place] in neuro IR holding. Interventional neuro radiology nursing staff was also present. Ultrasound survey of the right inguinal region was performed with images stored and sent to PACs. 11 blade scalpel was used to make a small incision. Blunt dissection was performed with US guidance. A micropuncture needle was used access the right common femoral artery under ultrasound. With excellent arterial blood flow returned, an .018 micro wire was passed through the needle, observed to enter the abdominal aorta under fluoroscopy. The needle was removed, and a micropuncture sheath was placed over the wire. The  inner dilator and wire were removed, and an 035 wire was advanced under fluoroscopy into the abdominal aorta. The sheath was removed and a 25cm 24F straight vascular sheath was placed. The dilator was removed and the sheath was flushed. Sheath was attached to pressurized and heparinized saline bag for constant forward flow. A coaxial system was then advanced over the 035 wire. This included a 90cm neuron Max with coaxial 125cm Berenstein diagnostic catheter. This was advanced to the proximal descending thoracic aorta. Wire was then removed. Double flush of the catheter was performed. Catheter was then used to select the left subclavian artery. Angiogram was performed. Using roadmap technique, the catheter was advanced over a standard glide wire into left vertebral, with distal position achieved of the diagnostic catheter. The Neuron max catheter was then attempted to advance into the vertebral artery, meeting resistance just after the origin at the first tortuous turn. The Neuron Max was withdrawn just to the origin, and the diagnostic catheter and the glide wire were removed. Formal angiogram was performed. Road map function was used once the occluded vessel was identified. Copious back flush was performed and the balloon catheter was attached to heparinized and pressurized saline bag for forward flow. A second coaxial system was then advanced through the balloon catheter, which included the selected intermediate catheter, microcatheter, and microwire. In this scenario, the set up included a 135cm CAT 5 catheter, a 160cm 021 lumen microcatheter, and 014 synchro soft wire. This system was advanced through the NeuronMax catheter under the road-map function, with adequate back-flush at the rotating hemostatic valve at that back end of the balloon guide. Upon the initial navigation of this second coaxial system into the proximal vertebral artery, the resistance of the system prolapsed the Neuron max into the aortic  arch. This was not recoverable. Microcatheter intermediate catheter were removed and the 125 cm Berenstein catheter was advanced to the neuron max. We then selected the vertebral artery again with a proximal position of the neuron max. Berenstein catheter and the wire were removed. Angiogram was repeated. A second attempt was used then to navigate the CT 5 intermediate catheter and the microcatheter into the vertebral artery, which again prolapsed the base catheter. With the inability to get a more distal position given the tortuosity of the proximal vertebral artery segment, we withdrew the  neuron max and abandoned this for a 6 French 95 cm benchmark catheter. The benchmark catheter was navigated with the mated diagnostic catheter into the left subclavian artery and the left vertebral artery. We were able to successfully navigate the combination into the mid segment of the cervical vertebral artery with the benchmark catheter achieving a more distal position. The Glidewire and the diagnostic catheter were withdrawn. Angiogram was performed. Microcatheter was then advanced into the basilar artery, and to the right p1 origin to the level of the occlusion. The synchro soft wire met resistance upon the initial passage through the occlusion, and so was withdrawn. We then used a 016 J shape headliner wire to navigate through the occlusion with the microcatheter following into the P3 segment of the PCA. Wire was gently removed with saline drip at the hub. Blood was then aspirated through the hub of the microcatheter, and a gentle contrast injection was performed confirming intraluminal position. A rotating hemostatic valve was then attached to the back end of the microcatheter, and a pressurized and heparinized saline bag was attached to the catheter. 3 x 20 solitaire device was then selected. Back flush was achieved at the rotating hemostatic valve, and then the device was gently advanced through the microcatheter to the  distal end. The retriever was then unsheathed by withdrawing the microcatheter under fluoroscopy. Control angiogram was performed from the intermediate catheter. A 3 minute time interval was observed. We then withdrew the microcatheter and the solitaire under fluoroscopic observation, wall aspirating at the hub of the benchmark catheter under hand aspiration with 2 x 60 cc syringes on a 3 way stopcock. Once the solitaire was withdrawn to the Touhy the 2 was removed for the back end of the benchmark allowing back flow through the benchmark catheter. We aspirated at the hub and discarded the aspirate. Repeat angiogram was then performed. Restoration of flow was confirmed. Angiogram of the cervical vertebral artery was performed. The skin at the puncture site was then cleaned with Chlorhexidine. The 8 French sheath was removed and an 61F angioseal was deployed. Flat panel CT was performed. Given the findings of a questionable interpeduncular hemorrhage, the patient remained intubated. Patient tolerated the procedure well and remained hemodynamically stable throughout. Blood loss was estimated 50 cc IMPRESSION: Status post ultrasound guided access right common femoral artery for cervical and cerebral angiogram and mechanical thrombectomy of right posterior cerebral artery for treatment of posterior circulation ELVO, with TICI 3 flow at the conclusion. Signed, Dulcy Fanny. Dellia Nims, RPVI Vascular and Interventional Radiology Specialists [facility] Radiology PLAN: The patient will remain intubated, as small volume subarachnoid hemorrhage was confirmed with formal CT ICU status Target systolic blood pressure of 120-140 Right hip straight time 6 hours Frequent neurovascular checks Repeat neurologic imaging with CT and/MRI at the discretion of neurology team Electronically Signed   By: Corrie Mckusick D.O.   On: 11/14/2019 02:58   IR US Guide Vasc Access Right  Result Date: 11/14/2019 INDICATION: 86 year old female presents  for mechanical thrombectomy with emergent large vessel occlusion involving the right posterior cerebral artery EXAM: ULTRASOUND-GUIDED ACCESS RIGHT COMMON FEMORAL ARTERY CERVICAL AND CEREBRAL ANGIOGRAM MECHANICAL THROMBECTOMY RIGHT PCA ANGIO-SEAL FOR HEMOSTASIS COMPARISON:  CT imaging same day MEDICATIONS: None ANESTHESIA/SEDATION: The anesthesia team was present to provide general endotracheal tube anesthesia and for patient monitoring during the procedure. Intubation was performed in negative pressure Bay in neuro IR holding. Left radial arterial line was performed by the anesthesia team. Interventional neuro radiology nursing staff was  also present. CONTRAST:  100 cc contrast FLUOROSCOPY TIME:  Fluoroscopy Time: 26 minutes 18 seconds (1961 mGy). COMPLICATIONS: SIR Level A - No therapy, no consequence. TECHNIQUE: Informed written consent was obtained from the patient's family after a thorough discussion of the procedural risks, benefits and alternatives. Specific risks discussed include: Bleeding, infection, contrast reaction, kidney injury/failure, need for further procedure/surgery, arterial injury or dissection, embolization to new territory, intracranial hemorrhage (10-15% risk), neurologic deterioration, cardiopulmonary collapse, death. All questions were addressed. Maximal Sterile Barrier Technique was utilized including during the procedure including caps, mask, sterile gowns, sterile gloves, sterile drape, hand hygiene and skin antiseptic. A timeout was performed prior to the initiation of the procedure. The anesthesia team was present to provide general endotracheal tube anesthesia and for patient monitoring during the procedure. Interventional neuro radiology nursing staff was also present. FINDINGS: Initial Findings: Left subclavian artery: Normal course caliber and contour. Left internal mammary artery is patent. Costo cervical trunk and thyrocervical trunk are patent. Left vertebral artery: No  significant atherosclerotic changes at the origin of the left vertebral artery. Tortuous course of the cervical vertebral artery without significant stenosis. Normal course caliber and contour of the intradural vertebral artery with no significant stenosis or atherosclerotic plaque. Basilar artery: Normal course caliber and contour. Patent right posteroinferior cerebellar artery. There appears to be a left-sided PICA/AICA variant Reflux of the distal right vertebral artery at the junction. Bilateral anterior inferior cerebellar arteries are patent. Bilateral superior cerebellar arteries are patent. Right PCA: Occluded just after the origin Left PCA: Normal course caliber and contour. Completion Findings: Right PCA: Complete restoration of flow after first pass, TICI 3. Baseline PCA flow: TICI 0 Final PCA flow: TICI 3 PROCEDURE: The anesthesia team was present to provide general endotracheal tube anesthesia and for patient monitoring during the procedure. Intubation was performed in negative pressure Bay in neuro IR holding. Interventional neuro radiology nursing staff was also present. Ultrasound survey of the right inguinal region was performed with images stored and sent to PACs. 11 blade scalpel was used to make a small incision. Blunt dissection was performed with US guidance. A micropuncture needle was used access the right common femoral artery under ultrasound. With excellent arterial blood flow returned, an .018 micro wire was passed through the needle, observed to enter the abdominal aorta under fluoroscopy. The needle was removed, and a micropuncture sheath was placed over the wire. The inner dilator and wire were removed, and an 035 wire was advanced under fluoroscopy into the abdominal aorta. The sheath was removed and a 25cm 50F straight vascular sheath was placed. The dilator was removed and the sheath was flushed. Sheath was attached to pressurized and heparinized saline bag for constant forward flow.  A coaxial system was then advanced over the 035 wire. This included a 90cm neuron Max with coaxial 125cm Berenstein diagnostic catheter. This was advanced to the proximal descending thoracic aorta. Wire was then removed. Double flush of the catheter was performed. Catheter was then used to select the left subclavian artery. Angiogram was performed. Using roadmap technique, the catheter was advanced over a standard glide wire into left vertebral, with distal position achieved of the diagnostic catheter. The Neuron max catheter was then attempted to advance into the vertebral artery, meeting resistance just after the origin at the first tortuous turn. The Neuron Max was withdrawn just to the origin, and the diagnostic catheter and the glide wire were removed. Formal angiogram was performed. Road map function was used once the occluded  vessel was identified. Copious back flush was performed and the balloon catheter was attached to heparinized and pressurized saline bag for forward flow. A second coaxial system was then advanced through the balloon catheter, which included the selected intermediate catheter, microcatheter, and microwire. In this scenario, the set up included a 135cm CAT 5 catheter, a 160cm 021 lumen microcatheter, and 014 synchro soft wire. This system was advanced through the NeuronMax catheter under the road-map function, with adequate back-flush at the rotating hemostatic valve at that back end of the balloon guide. Upon the initial navigation of this second coaxial system into the proximal vertebral artery, the resistance of the system prolapsed the Neuron max into the aortic arch. This was not recoverable. Microcatheter intermediate catheter were removed and the 125 cm Berenstein catheter was advanced to the neuron max. We then selected the vertebral artery again with a proximal position of the neuron max. Berenstein catheter and the wire were removed. Angiogram was repeated. A second attempt was  used then to navigate the CT 5 intermediate catheter and the microcatheter into the vertebral artery, which again prolapsed the base catheter. With the inability to get a more distal position given the tortuosity of the proximal vertebral artery segment, we withdrew the neuron max and abandoned this for a 6 French 95 cm benchmark catheter. The benchmark catheter was navigated with the mated diagnostic catheter into the left subclavian artery and the left vertebral artery. We were able to successfully navigate the combination into the mid segment of the cervical vertebral artery with the benchmark catheter achieving a more distal position. The Glidewire and the diagnostic catheter were withdrawn. Angiogram was performed. Microcatheter was then advanced into the basilar artery, and to the right p1 origin to the level of the occlusion. The synchro soft wire met resistance upon the initial passage through the occlusion, and so was withdrawn. We then used a 016 J shape headliner wire to navigate through the occlusion with the microcatheter following into the P3 segment of the PCA. Wire was gently removed with saline drip at the hub. Blood was then aspirated through the hub of the microcatheter, and a gentle contrast injection was performed confirming intraluminal position. A rotating hemostatic valve was then attached to the back end of the microcatheter, and a pressurized and heparinized saline bag was attached to the catheter. 3 x 20 solitaire device was then selected. Back flush was achieved at the rotating hemostatic valve, and then the device was gently advanced through the microcatheter to the distal end. The retriever was then unsheathed by withdrawing the microcatheter under fluoroscopy. Control angiogram was performed from the intermediate catheter. A 3 minute time interval was observed. We then withdrew the microcatheter and the solitaire under fluoroscopic observation, wall aspirating at the hub of the  benchmark catheter under hand aspiration with 2 x 60 cc syringes on a 3 way stopcock. Once the solitaire was withdrawn to the Touhy the 2 was removed for the back end of the benchmark allowing back flow through the benchmark catheter. We aspirated at the hub and discarded the aspirate. Repeat angiogram was then performed. Restoration of flow was confirmed. Angiogram of the cervical vertebral artery was performed. The skin at the puncture site was then cleaned with Chlorhexidine. The 8 French sheath was removed and an 70F angioseal was deployed. Flat panel CT was performed. Given the findings of a questionable interpeduncular hemorrhage, the patient remained intubated. Patient tolerated the procedure well and remained hemodynamically stable throughout. Blood loss was  estimated 50 cc IMPRESSION: Status post ultrasound guided access right common femoral artery for cervical and cerebral angiogram and mechanical thrombectomy of right posterior cerebral artery for treatment of posterior circulation ELVO, with TICI 3 flow at the conclusion. Signed, Dulcy Fanny. Dellia Nims, RPVI Vascular and Interventional Radiology Specialists [facility] Radiology PLAN: The patient will remain intubated, as small volume subarachnoid hemorrhage was confirmed with formal CT ICU status Target systolic blood pressure of 120-140 Right hip straight time 6 hours Frequent neurovascular checks Repeat neurologic imaging with CT and/MRI at the discretion of neurology team Electronically Signed   By: Corrie Mckusick D.O.   On: 11/14/2019 02:58   DG CHEST PORT 1 VIEW  Result Date: 11/14/2019 CLINICAL DATA:  Endotracheal tube placement EXAM: PORTABLE CHEST 1 VIEW COMPARISON:  10/08/2010 FINDINGS: The endotracheal tube is been placed with tip measuring 4.7 cm above the carina. Cardiac enlargement. Small bilateral pleural effusions. Peripheral interstitial changes with evidence of bronchiectasis, likely chronic fibrosis. Edema or pneumonia would be less  likely considerations. No pneumothorax. Mediastinal contours appear intact. Calcification of the aorta. IMPRESSION: Endotracheal tube placed with tip measuring 4.7 cm above the carina. Cardiac enlargement with small bilateral pleural effusions. Probable chronic interstitial changes. Electronically Signed   By: Lucienne Capers M.D.   On: 11/14/2019 03:59   IR PERCUTANEOUS ART THROMBECTOMY/INFUSION INTRACRANIAL INC DIAG ANGIO  Result Date: 11/14/2019 INDICATION: 86 year old female presents for mechanical thrombectomy with emergent large vessel occlusion involving the right posterior cerebral artery EXAM: ULTRASOUND-GUIDED ACCESS RIGHT COMMON FEMORAL ARTERY CERVICAL AND CEREBRAL ANGIOGRAM MECHANICAL THROMBECTOMY RIGHT PCA ANGIO-SEAL FOR HEMOSTASIS COMPARISON:  CT imaging same day MEDICATIONS: None ANESTHESIA/SEDATION: The anesthesia team was present to provide general endotracheal tube anesthesia and for patient monitoring during the procedure. Intubation was performed in negative pressure Bay in neuro IR holding. Left radial arterial line was performed by the anesthesia team. Interventional neuro radiology nursing staff was also present. CONTRAST:  100 cc contrast FLUOROSCOPY TIME:  Fluoroscopy Time: 26 minutes 18 seconds (1961 mGy). COMPLICATIONS: SIR Level A - No therapy, no consequence. TECHNIQUE: Informed written consent was obtained from the patient's family after a thorough discussion of the procedural risks, benefits and alternatives. Specific risks discussed include: Bleeding, infection, contrast reaction, kidney injury/failure, need for further procedure/surgery, arterial injury or dissection, embolization to new territory, intracranial hemorrhage (10-15% risk), neurologic deterioration, cardiopulmonary collapse, death. All questions were addressed. Maximal Sterile Barrier Technique was utilized including during the procedure including caps, mask, sterile gowns, sterile gloves, sterile drape, hand  hygiene and skin antiseptic. A timeout was performed prior to the initiation of the procedure. The anesthesia team was present to provide general endotracheal tube anesthesia and for patient monitoring during the procedure. Interventional neuro radiology nursing staff was also present. FINDINGS: Initial Findings: Left subclavian artery: Normal course caliber and contour. Left internal mammary artery is patent. Costo cervical trunk and thyrocervical trunk are patent. Left vertebral artery: No significant atherosclerotic changes at the origin of the left vertebral artery. Tortuous course of the cervical vertebral artery without significant stenosis. Normal course caliber and contour of the intradural vertebral artery with no significant stenosis or atherosclerotic plaque. Basilar artery: Normal course caliber and contour. Patent right posteroinferior cerebellar artery. There appears to be a left-sided PICA/AICA variant Reflux of the distal right vertebral artery at the junction. Bilateral anterior inferior cerebellar arteries are patent. Bilateral superior cerebellar arteries are patent. Right PCA: Occluded just after the origin Left PCA: Normal course caliber and contour. Completion Findings: Right PCA:  Complete restoration of flow after first pass, TICI 3. Baseline PCA flow: TICI 0 Final PCA flow: TICI 3 PROCEDURE: The anesthesia team was present to provide general endotracheal tube anesthesia and for patient monitoring during the procedure. Intubation was performed in negative pressure Bay in neuro IR holding. Interventional neuro radiology nursing staff was also present. Ultrasound survey of the right inguinal region was performed with images stored and sent to PACs. 11 blade scalpel was used to make a small incision. Blunt dissection was performed with US guidance. A micropuncture needle was used access the right common femoral artery under ultrasound. With excellent arterial blood flow returned, an .018 micro  wire was passed through the needle, observed to enter the abdominal aorta under fluoroscopy. The needle was removed, and a micropuncture sheath was placed over the wire. The inner dilator and wire were removed, and an 035 wire was advanced under fluoroscopy into the abdominal aorta. The sheath was removed and a 25cm 6F straight vascular sheath was placed. The dilator was removed and the sheath was flushed. Sheath was attached to pressurized and heparinized saline bag for constant forward flow. A coaxial system was then advanced over the 035 wire. This included a 90cm neuron Max with coaxial 125cm Berenstein diagnostic catheter. This was advanced to the proximal descending thoracic aorta. Wire was then removed. Double flush of the catheter was performed. Catheter was then used to select the left subclavian artery. Angiogram was performed. Using roadmap technique, the catheter was advanced over a standard glide wire into left vertebral, with distal position achieved of the diagnostic catheter. The Neuron max catheter was then attempted to advance into the vertebral artery, meeting resistance just after the origin at the first tortuous turn. The Neuron Max was withdrawn just to the origin, and the diagnostic catheter and the glide wire were removed. Formal angiogram was performed. Road map function was used once the occluded vessel was identified. Copious back flush was performed and the balloon catheter was attached to heparinized and pressurized saline bag for forward flow. A second coaxial system was then advanced through the balloon catheter, which included the selected intermediate catheter, microcatheter, and microwire. In this scenario, the set up included a 135cm CAT 5 catheter, a 160cm 021 lumen microcatheter, and 014 synchro soft wire. This system was advanced through the NeuronMax catheter under the road-map function, with adequate back-flush at the rotating hemostatic valve at that back end of the balloon  guide. Upon the initial navigation of this second coaxial system into the proximal vertebral artery, the resistance of the system prolapsed the Neuron max into the aortic arch. This was not recoverable. Microcatheter intermediate catheter were removed and the 125 cm Berenstein catheter was advanced to the neuron max. We then selected the vertebral artery again with a proximal position of the neuron max. Berenstein catheter and the wire were removed. Angiogram was repeated. A second attempt was used then to navigate the CT 5 intermediate catheter and the microcatheter into the vertebral artery, which again prolapsed the base catheter. With the inability to get a more distal position given the tortuosity of the proximal vertebral artery segment, we withdrew the neuron max and abandoned this for a 6 French 95 cm benchmark catheter. The benchmark catheter was navigated with the mated diagnostic catheter into the left subclavian artery and the left vertebral artery. We were able to successfully navigate the combination into the mid segment of the cervical vertebral artery with the benchmark catheter achieving a more distal position.  The Glidewire and the diagnostic catheter were withdrawn. Angiogram was performed. Microcatheter was then advanced into the basilar artery, and to the right p1 origin to the level of the occlusion. The synchro soft wire met resistance upon the initial passage through the occlusion, and so was withdrawn. We then used a 016 J shape headliner wire to navigate through the occlusion with the microcatheter following into the P3 segment of the PCA. Wire was gently removed with saline drip at the hub. Blood was then aspirated through the hub of the microcatheter, and a gentle contrast injection was performed confirming intraluminal position. A rotating hemostatic valve was then attached to the back end of the microcatheter, and a pressurized and heparinized saline bag was attached to the catheter. 3  x 20 solitaire device was then selected. Back flush was achieved at the rotating hemostatic valve, and then the device was gently advanced through the microcatheter to the distal end. The retriever was then unsheathed by withdrawing the microcatheter under fluoroscopy. Control angiogram was performed from the intermediate catheter. A 3 minute time interval was observed. We then withdrew the microcatheter and the solitaire under fluoroscopic observation, wall aspirating at the hub of the benchmark catheter under hand aspiration with 2 x 60 cc syringes on a 3 way stopcock. Once the solitaire was withdrawn to the Touhy the 2 was removed for the back end of the benchmark allowing back flow through the benchmark catheter. We aspirated at the hub and discarded the aspirate. Repeat angiogram was then performed. Restoration of flow was confirmed. Angiogram of the cervical vertebral artery was performed. The skin at the puncture site was then cleaned with Chlorhexidine. The 8 French sheath was removed and an 80F angioseal was deployed. Flat panel CT was performed. Given the findings of a questionable interpeduncular hemorrhage, the patient remained intubated. Patient tolerated the procedure well and remained hemodynamically stable throughout. Blood loss was estimated 50 cc IMPRESSION: Status post ultrasound guided access right common femoral artery for cervical and cerebral angiogram and mechanical thrombectomy of right posterior cerebral artery for treatment of posterior circulation ELVO, with TICI 3 flow at the conclusion. Signed, Dulcy Fanny. Dellia Nims, RPVI Vascular and Interventional Radiology Specialists [facility] Radiology PLAN: The patient will remain intubated, as small volume subarachnoid hemorrhage was confirmed with formal CT ICU status Target systolic blood pressure of 120-140 Right hip straight time 6 hours Frequent neurovascular checks Repeat neurologic imaging with CT and/MRI at the discretion of neurology team  Electronically Signed   By: Corrie Mckusick D.O.   On: 11/14/2019 02:58   CT HEAD CODE STROKE WO CONTRAST  Result Date: 11/13/2019 CLINICAL DATA:  Code stroke. 86 year old female with rightward gaze and left side weakness. EXAM: CT HEAD WITHOUT CONTRAST TECHNIQUE: Contiguous axial images were obtained from the base of the skull through the vertex without intravenous contrast. COMPARISON:  Head CT 02/04/2005. FINDINGS: Brain: No acute intracranial hemorrhage identified. No midline shift, mass effect, or evidence of intracranial mass lesion. Chronic cerebral volume loss. Ex vacuo appearing ventricular enlargement. Patchy bilateral cerebral white matter hypodensity. Age indeterminate patchy hypodensity in the thalami. Basal ganglia appear relatively spared. No cortically based acute infarct identified. No cortical encephalomalacia identified. Brainstem and cerebellum are within normal limits. Vascular: Calcified atherosclerosis at the skull base. No suspicious intracranial vascular hyperdensity. Skull: No acute osseous abnormality identified. Sinuses/Orbits: Mild new mastoid effusions. Paranasal sinuses remain well pneumatized. Other: No acute orbit or scalp soft tissue finding. Negative visible nasopharynx. ASPECTS [facility] Stroke Program Early  CT Score) Total score (0-10 with 10 being normal): 10 IMPRESSION: 1. Age indeterminate small vessel disease including in the right thalamus, but no acute cortically based infarct or acute intracranial hemorrhage identified. ASPECTS 10. 2. These results were communicated to Dr. Curly Shores At 10:56 pm on 11/13/2019 by text page via the [facility] messaging system. And also discussed by telephone at that same time. Electronically Signed   By: Genevie Ann M.D.   On: 11/13/2019 22:57    Labs:  CBC: Recent Labs    11/13/19 2244 11/13/19 2248 11/14/19 0351  WBC 6.3  --   --   HGB 12.8 13.3 10.5*  HCT 40.4 39.0 31.0*  PLT 343  --   --     COAGS: Recent Labs    11/13/19 2244    INR 1.0  APTT 31    BMP: Recent Labs    11/13/19 2244 11/13/19 2248 11/14/19 0351  NA 137 142 144  K 3.9 3.9 3.1*  CL 103 103  --   CO2 24  --   --   GLUCOSE 107* 102*  --   BUN 20 20  --   CALCIUM 8.3*  --   --   CREATININE 0.83 0.70  --   GFRNONAA >60  --   --     LIVER FUNCTION TESTS: Recent Labs    11/13/19 2244  BILITOT 0.8  AST 63*  ALT 61*  ALKPHOS 46  PROT 6.2*  ALBUMIN 2.8*    Assessment and Plan:  History of acute CVA s/p cerebral arteriogram with emergent mechanical thrombectomy of right PCA occlusion achieving a TICI 3 revascularization via right femoral approach 11/14/2019 by Dr. Earleen Newport. Patient's condition stable- remains intubated without sedation, follows simple commands, can spontaneously move all extremities with weakness of left side. Right femoral puncture site stable, distal pulses (DPs) 2+ bilaterally. For possible extubation today. Further plans per neurology/CCM- appreciate and agree with management. Please call NIR with questions/concerns.   Electronically Signed: Earley Abide, PA-C 11/14/2019, 11:33 AM   I spent a total of 25 Minutes at the the patient's bedside AND on the patient's hospital floor or unit, greater than 50% of which was counseling/coordinating care for CVA s/p revascularization.

## 2019-11-14 NOTE — Progress Notes (Signed)
OT Cancellation Note  Patient Details Name: [name] MRN: [id number] DOB: Jul 28, 1931   Cancelled Treatment:    Reason Eval/Treat Not Completed: Active bedrest order-- pt on strict bedrest.  Will follow and see as able/appropraite.   Jolaine Artist, OT Acute Rehabilitation Services Pager [phone] Office [phone]   Delight Stare 11/14/2019, 8:17 AM

## 2019-11-14 NOTE — Progress Notes (Signed)
eLink Physician-Brief Progress Note Patient Name: [name] DOB: Nov 27, 1931 MRN: [id number]   Date of Service  11/14/2019  HPI/Events of Note  86 yo female with PMH of AFIB without documented anticoagulation and pneumonia for which she was being treated as an outpatient. Not documented what she was treated with. Presents with ischemic stroke. Now s/p R PCA mechanical thrombectomy complicated by posterior fossa SAH vs hemorrhagic conversion of ischemic areas. Getting head CT Scan. SBP goal = 120-140. COVID negative. PCCM consulted. For ventilator management. Patient arrives in ICU with BP = 164/80 by A-line.  eICU Interventions  Plan: 1. Cleviprex IV infusion. Titrate to MAP = 120-140.  2. Ground team notified of patient's arrival in ICU.      Intervention Category Evaluation Type: New Patient Evaluation  Lysle Dingwall 11/14/2019, 2:35 AM

## 2019-11-14 NOTE — Progress Notes (Cosign Needed)
Patient extubated at 10:16 this am. Per RT, patient had no cuff leak prior to extubation. Placed on 4Lpm humidified oxygen and tolerating well. Neurologically she is intact with no obvious deficit noted. Patient is alert and oriented x 4. Able to follow commands without any difficulty. Patient's daughter at the bedside. I discussed with stroke service attending Dr. Erlinda Hong regarding patient's respiratory and hemodynamics status which are currently stable. Patient will have a repeat CT head 24 hour post TPA.  PCCM will sign off. Stroke service will continue to follow.    Rufina Falco. DNP, CCRN, FNP-BC [place]

## 2019-11-14 NOTE — Progress Notes (Signed)
STROKE TEAM PROGRESS NOTE   INTERVAL HISTORY RN and daughter are at bedside.  Patient was extubated earlier this morning, tolerating pretty well except sore throat.  Her right gaze has resolved, still has mild left hemiparesis, but much improved neurologically from yesterday.  Telemetry still showing A. fib.  Per daughter, patient has been discharged from cardiology clinic in the past, currently on aspirin 81.  From our record, patient last seen cardiology Dr. Marlou Porch in 12/2015.  OBJECTIVE Vitals:   11/14/19 0600 11/14/19 0806 11/14/19 0807 11/14/19 0831  BP:      Pulse: 89 83 78   Resp: 15 20 18    Temp:    (!) 97 F (36.1 C)  TempSrc:    Axillary  SpO2: 98% 100% 100% 100%  Weight:      Height:       CBC:  Recent Labs  Lab 11/13/19 2244 11/13/19 2244 11/13/19 2248 11/14/19 0351  WBC 6.3  --   --   --   NEUTROABS 3.8  --   --   --   HGB 12.8   < > 13.3 10.5*  HCT 40.4   < > 39.0 31.0*  MCV 100.0  --   --   --   PLT 343  --   --   --    < > = values in this interval not displayed.   Basic Metabolic Panel:  Recent Labs  Lab 11/13/19 2244 11/13/19 2244 11/13/19 2248 11/14/19 0351  NA 137   < > 142 144  K 3.9   < > 3.9 3.1*  CL 103  --  103  --   CO2 24  --   --   --   GLUCOSE 107*  --  102*  --   BUN 20  --  20  --   CREATININE 0.83  --  0.70  --   CALCIUM 8.3*  --   --   --    < > = values in this interval not displayed.   Lipid Panel:     Component Value Date/Time   CHOL 70 11/14/2019 0408   TRIG 126 11/14/2019 0408   TRIG 127 11/14/2019 0408   HDL 19 (L) 11/14/2019 0408   CHOLHDL 3.7 11/14/2019 0408   VLDL 25 11/14/2019 0408   LDLCALC 26 11/14/2019 0408   HgbA1c: No results found for: HGBA1C Urine Drug Screen: No results found for: LABOPIA, COCAINSCRNUR, LABBENZ, AMPHETMU, THCU, LABBARB  Alcohol Level     Component Value Date/Time   ETH <10 11/13/2019 2244    IMAGING  CT Code Stroke CTA Head W/WO contrast CT Code Stroke CTA Neck W/WO  contrast 11/13/2019 IMPRESSION:  1. Positive for occlusion of the Right PCA origin.  2. No other large vessel occlusion, and generally mild for age atherosclerosis in the head and neck.  3. Emphysema (ICD10-J43.9) with layering left pleural effusion, patchy left upper lobe opacity compatible with pneumonia in this setting.  4. Aortic Atherosclerosis (ICD10-I70.0)   CT HEAD WO CONTRAST 11/14/2019 IMPRESSION:  1. Status post Right PCA endovascular revascularization with trace subarachnoid blood/contrast in the interpeduncular cistern.  2. Small 10 mm area of contrast staining versus petechial hemorrhage in the medial right thalamus. And question subtle increased right thalamic hypodensity elsewhere, but no other Right PCA territory infarct changes are identified.   MR BRAIN WO CONTRAST MR ANGIO HEAD WO CONTRAST 11/14/2019 IMPRESSION:  Acute infarct right thalamus with associated mild hemorrhage as noted on recent CT.  There is also mild subarachnoid hemorrhage in the interpeduncular cistern unchanged from CT. Additional small areas of acute infarct in the right cerebellum and right frontal cortex. Moderate atrophy. Chronic microvascular ischemic change in the white matter. Right PCA patent post thrombectomy. There is a mild stenosis in the right PCA at the P1/ P2 junction.   [place] 11/14/2019 IMPRESSION:  Status post ultrasound guided access right common femoral artery for cervical and cerebral angiogram and mechanical thrombectomy of right posterior cerebral artery for treatment of posterior circulation ELVO, with TICI 3 flow at the conclusion. PLAN: The patient will remain intubated, as small volume subarachnoid hemorrhage was confirmed with formal CT ICU status Target systolic blood pressure of 120-140 Right hip straight time 6 hours Frequent neurovascular checks Repeat neurologic imaging with CT and/MRI at the discretion of neurology team.  DG CHEST PORT 1  VIEW 11/14/2019 IMPRESSION: Endotracheal tube placed with tip measuring 4.7 cm above the carina. Cardiac enlargement with small bilateral pleural effusions. Probable chronic interstitial changes.   CT HEAD CODE STROKE WO CONTRAST 11/13/2019 IMPRESSION:  Age indeterminate small vessel disease including in the right thalamus, but no acute cortically based infarct or acute intracranial hemorrhage identified. ASPECTS 10.   Transthoracic Echocardiogram  11/14/2019 Pending    ECG - atrial fibrillation - ventricular response 87 BPM (See cardiology reading for complete details)   PHYSICAL EXAM  Temp:  [97 F (36.1 C)-97.8 F (36.6 C)] 97.8 F (36.6 C) (10/20 1154) Pulse Rate:  [73-90] 73 (10/20 1400) Resp:  [15-20] 16 (10/20 1300) BP: (100-166)/(56-74) 119/57 (10/20 1300) SpO2:  [97 %-100 %] 100 % (10/20 1400) Arterial Line BP: (90-139)/(50-88) 138/69 (10/20 1300) FiO2 (%):  [40 %] 40 % (10/20 0806) Weight:  [66.4 kg] 66.4 kg (10/19 2259)  General - Well nourished, well developed, slight lethargic.  Ophthalmologic - fundi not visualized due to noncooperation.  Cardiovascular - irregularly irregular heart rate and rhythm.  Mental Status -  Level of arousal and orientation to place, and person were intact, however, not orientated to time and age. Language exam showed in whisper voice due to sore throat shortly after extubation.  Able to answer questions appropriately with short answers, paucity of speech, following all simple commands.  Cranial Nerves II - XII - II - Visual field intact OU. III, IV, VI - Extraocular movements intact. V - Facial sensation intact bilaterally. VII - mild left nasolabial fold flattening. VIII - Hearing & vestibular intact bilaterally. X - Palate elevates symmetrically. XI - Chin turning & shoulder shrug intact bilaterally. XII - Tongue protrusion intact.  Motor Strength - The patient's strength was normal in right upper and lower extremities,  left upper extremity drift with mildly decreased finger grip, left lower extremity decreased strength at knee flexion and left foot DF/PF.  Bulk was normal and fasciculations were absent.   Motor Tone - Muscle tone was assessed at the neck and appendages and was normal.  Reflexes - The patient's reflexes were symmetrical in all extremities and she had no pathological reflexes.  Sensory - Light touch, temperature/pinprick were assessed and were symmetrical.    Coordination - The patient had normal movements in the hands with no ataxia or dysmetria.  Tremor was absent.  Gait and Station - deferred.   ASSESSMENT/PLAN [name] is a 86 y.o. female with history of Afib/flutter s/p ablation, off AC due to persistent sinus rhythm, and a recent diagnosis of pneumonia (she was not improving on the first  round of antibiotics tried and therefore was broadened to Augmentin on Monday), presenting with acute onset right gaze preference and left-sided weakness. The patient received IV t-PA Tuesday 11/19 at 10:59 PM and sent to IR for R PCA occlusion.  Stroke: R thalamic infarct due to right PCA occlusion s/p tPA and IR with TICI3 from AF not on [facility] Small SAH interpeduncular cistern post IR  CT Head - Age indeterminate small vessel disease including in the right thalamus, but no acute cortically based infarct or acute intracranial hemorrhage identified.   CTA H&N - Positive for occlusion of the Right PCA origin. No other large vessel occlusion, and generally mild for age atherosclerosis in the head and neck.   Cerebral angio/ IR - Rt PCA occlusion with TICI 3 flow   CT head - Status post Right PCA endovascular revascularization with trace subarachnoid blood/contrast in the interpeduncular cistern. Small 10 mm area of contrast staining versus petechial hemorrhage in the medial right thalamus.   MRI head - R thalamus infarct with associated mild hemorrhage as noted on recent CT. There is also mild  subarachnoid hemorrhage in the interpeduncular cistern unchanged from CT. Additional small areas of acute infarct in the right cerebellum and right frontal cortex. Moderate atrophy   MRA head -  Right PCA patent post thrombectomy. There is a mild stenosis in the right PCA at the P1/ P2 junction.   2D Echo - pending  Lacey Jensen Virus 2 - negative  LDL - 26  HgbA1c - pending   VTE prophylaxis - SCDs  aspirin 81 mg daily prior to admission, now on No antithrombotic S/P tPA and post tPA small SAH. Consider AC once [facility] resolved     Therapy recommendations:  pending  Disposition:  Pending  Acute Respiratory Failure  Intubated for IR, left intubated post IR   Sedated w/ fentanyl and propofol, now off  Extubated 10/20  Patient stable  CCM signed off   Atrial Fibrillation/Aflutter  Home anticoagulation:  none   post ablation Dr. Ashley Mariner 2004. Doing very well. No further recurrences.  No AC now given post tPA small SAH   Consider AC once SAH resolved     Follows with Dr. Marlou Porch at [place] [facility]  No response to intial abx  Broadened as OP to Augmention 10/18  Still has intermittent coughing  CTA - layering left pleural effusion, patchy left upper lobe opacity compatible with pneumonia  CXR 10/20 - small bilateral pleural effusions. Probable chronic interstitial changes.  WBCs 6.3 (afebrile)  Hypertension  Home BP meds: metoprolol   Treated short term w/ cleviprex, off this am  BP stable . BP goal 120-140 x 24h following IR procedure  . BP goal < 160 post 24h of IR due to [facility]  . Long-term BP goal normotensive  Hyperlipidemia  Home Lipid lowering medication: Lipitor 10 mg daily  LDL 26, goal < 70  Will not resume lipitor due to low LDL level  Dysphagia . Secondary to stroke . NPO . Speech eval pending . On IVF    Other Stroke Risk Factors  Advanced age  Former cigarette smoker - quit  ETOH use, advised to drink no  more than 1 alcoholic beverage per day.  Other Active Problems  Code status - Full code  Emphysema (VPX10-G26.9)   Aortic Atherosclerosis (ICD10-I70.0)   Anemia - Hgb - 12.8->13.3-> 10.5   Hypokalemia - potassium - 3.1 - supplement  Hospital day # 1  This  patient is critically ill due to right PCA stroke, PCA occlusion status post thrombectomy and TPA, A. fib not on anticoagulation, dysphagia, hypokalemia and at significant risk of neurological worsening, death form recurrent stroke, hemorrhagic conversion, heart failure, seizure. This patient's care requires constant monitoring of vital signs, hemodynamics, respiratory and cardiac monitoring, review of multiple databases, neurological assessment, discussion with family, other specialists and medical decision making of high complexity. I spent 35 minutes of neurocritical care time in the care of this patient. I had long discussion with daughter at bedside, updated pt current condition, treatment plan and potential prognosis, and answered all the questions.  She expressed understanding and appreciation.  I also discussed with CCM NP Benjamine Mola.   Rosalin Hawking, MD PhD Stroke Neurology 11/14/2019 3:19 PM    To contact Stroke Continuity provider, please refer to [email or web address] After hours, contact General Neurology

## 2019-11-14 NOTE — Anesthesia Postprocedure Evaluation (Signed)
Anesthesia Post Note  Patient: [name]  Procedure(s) Performed: IR WITH ANESTHESIA (N/A )     Patient location during evaluation: ICU Anesthesia Type: General Level of consciousness: sedated and patient remains intubated per anesthesia plan Pain management: pain level controlled Vital Signs Assessment: post-procedure vital signs reviewed and stable Respiratory status: patient remains intubated per anesthesia plan and patient on ventilator - see flowsheet for VS Cardiovascular status: blood pressure returned to baseline and stable Postop Assessment: no apparent nausea or vomiting Anesthetic complications: no   No complications documented.  Last Vitals:  Vitals:   11/14/19 0807 11/14/19 0831  BP:    Pulse: 78   Resp: 18   Temp:  (!) 36.1 C  SpO2: 100% 100%    Last Pain:  Vitals:   11/14/19 0831  TempSrc: Axillary  PainSc:                  [name],[name]

## 2019-11-14 NOTE — Sedation Documentation (Signed)
Sheath was removed at 0136 and closed with a 8 fr angioseal.

## 2019-11-14 NOTE — Progress Notes (Signed)
  Echocardiogram 2D Echocardiogram has been performed with Definity.  [name] 11/14/2019, 3:02 PM

## 2019-11-14 NOTE — Sedation Documentation (Signed)
Called and gave verbal report prior to transporting patient to 20M. Gave updated bedside report to [place], RN on 20M handoff. Patient's groin site level zero at handoff.

## 2019-11-14 NOTE — Consult Note (Addendum)
NAME:  [name], MRN:  [id number], DOB:  10/20/1931, LOS: 1 ADMISSION DATE:  11/13/2019, CONSULTATION DATE:  11/14/2010 REFERRING MD:  Lesleigh Noe CHIEF COMPLAINT:    Brief History   86 yo female with PMH significant for Afib/flutter s/p ablation, off AC due to persistent NSR on ASA only, admitted with R PCA occlusion.  S/p TPA and thrombectomy.   History of present illness    Patient presented to [facility] ED with acute onset of slurred speech, left sided weakness and right gaze preference.  GCS 15 on presentation. Last known well: 2115 10/19.  CODE stroke was called. Initial NIHSS was 11.  Imaging revealed R PCA occlusion. Patient received TPA in ED ON 10/19 AT 10:59 PM. Post TPA she went emergently for thrombectomy where she was intubated. TICI 3. Post procedure CTH concerning for posterior fossa SAH vs hemorrhagic conversion of ischemic areas and therefore patient remained intubated.  Goal SBP 120-140.  On arrival, patient was not reversed from procedure. Pupils are 2 and reactive, no cough/gag/ext movement   Past Medical History  Afib/aflutter not on [place] Hospital Events   10/19: Admit under stroke service with Right PCA occlusion s/p TPA and thrombectomy 10/20: PCCM consulted post endovascular revascularization to Right PCA  Consults:  10/19 Neurology 10/20 PCCM   Procedures:  10/20 left radial a line 10/20 intubation 10/20; IR cerebral angiogram with mechanical thrombectomy for  Right PCA occlusion  Significant Diagnostic Tests:  10/19 CTA Brain:R PCA occlusion.  10/20: NWG:NFAOZH post Right PCA endovascular revascularization with trace subarachnoid blood/contrast in the interpeduncular cistern. Small 10 mm area of contrast staining versus petechial hemorrhage in the medial right thalamus. 10/20: MRA/MRI Brain shows Acute infarct right thalamus with associated mild hemorrhage as noted on recent CT. There is also mild subarachnoid hemorrhage in the  interpeduncular cistern unchanged from CT. Additional small areas of acute infarct in the right cerebellum and right frontal cortex.  Micro Data:  COVID negative  Antimicrobials:    Interim history/subjective:  New admit, see above  Objective   Blood pressure (!) 100/56, pulse 78, temperature (!) 97 F (36.1 C), temperature source Axillary, resp. rate 18, height 5\' 1"  (1.549 m), weight 66.4 kg, SpO2 100 %.    Vent Mode: PRVC FiO2 (%):  [40 %] 40 % Set Rate:  [18 bmp] 18 bmp Vt Set:  [380 mL] 380 mL PEEP:  [5 cmH20] 5 cmH20 Plateau Pressure:  [14 cmH20] 14 cmH20   Intake/Output Summary (Last 24 hours) at 11/14/2019 0839 Last data filed at 11/14/2019 0700 Gross per 24 hour  Intake 1416.35 ml  Output 500 ml  Net 916.35 ml   Filed Weights   11/13/19 2200 11/13/19 2259  Weight: 66.4 kg 66.4 kg    Examination: GENERAL: 86 year old patient lying in the bed with no acute distress. Intubated, mechanically ventilated. Sedation held for Exam EYES: Pupils equal, round, reactive to light and accommodation. No scleral icterus. Extraocular muscles intact.  HEENT: Head atraumatic, normocephalic. Oropharynx and nasopharynx clear.  NECK:  Supple, no jugular venous distention. No thyroid enlargement, no tenderness.  LUNGS: Normal breath sounds bilaterally, no wheezing, rales,rhonchi or crepitation. No use of accessory muscles of respiration.  CARDIOVASCULAR: S1, S2 normal. No murmurs, rubs, or gallops.  ABDOMEN: Soft, nontender, nondistended. Bowel sounds present. No organomegaly or mass.  EXTREMITIES: No pedal edema, cyanosis, or clubbing.  NEUROLOGIC: Cranial nerves II through XII are intact.  Muscle strength 5/5 in all extremities. Sensation intact.  Gait not checked.  PSYCHIATRIC: The patient is alert and nods appropriately to orientation.  SKIN: No obvious rash, lesion, or ulcer.   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  Follow up MRI/MRA brain without contrast per stroke protocol reviewed as above  P: -Vital signs/blood pressure with neuro checks/NIHSS  per tPA protocol for the first 24 hours  -No lines, catheters, antiplatelet or anticoagulants x24 hr after IV tPA unless required  -Keep SBP  102-140 per Neurology -HgA1c pendinig -Place SCDs for DVT prevention. -NPO for now  -PT consult, OT consult, Speech consult -Aspiration precautions, Bleeding precautions -Obtain STAT head CT for any new acute headache or new neurological deficits -Neurology following, input appreciated   Atrial Fibrillation/Aflutter  Afib with rate controlled on telemetry P: -Holding AC in the setting of SAH -Hold home asa and metoprolol  HTN -Cleviprex and PRN labetalol available  Intubated for airway protection -Intubated for thrombectomy. Chest xray reviewed and shows Endotracheal tube placed with tip measuring 4.7 cm above the carina. Cardiac enlargement with small bilateral pleural effusions  P: -Continue full vent support for now -Plan SBT today for possible extubation -VAP protocol  Best practice:  Diet: NPO Pain/Anxiety/Delirium protocol (if indicated): Propofol VAP protocol (if indicated): yes DVT prophylaxis: hold x 24 hours post tpa GI prophylaxis: pepcid Glucose control: n/a Mobility: bedrest Code Status: Full Family Update: Son at the bedside. Updated on progress and plan of care Disposition: ICU.   Labs   CBC: Recent Labs  Lab 11/13/19 2244 11/13/19 2248 11/14/19 0351  WBC 6.3  --   --   NEUTROABS 3.8  --   --   HGB 12.8 13.3 10.5*  HCT 40.4 39.0 31.0*  MCV 100.0  --   --   PLT 343  --   --     Basic Metabolic Panel: Recent Labs  Lab 11/13/19 2244 11/13/19 2248 11/14/19 0351  NA 137 142 144  K 3.9  3.9 3.1*  CL 103 103  --   CO2 24  --   --   GLUCOSE 107* 102*  --   BUN 20 20  --   CREATININE 0.83 0.70  --   CALCIUM 8.3*  --   --    GFR: Estimated Creatinine Clearance: 44 mL/min (by C-G formula based on SCr of 0.7 mg/dL). Recent Labs  Lab 11/13/19 2244  WBC 6.3    Liver Function Tests: Recent Labs  Lab 11/13/19 2244  AST 63*  ALT 61*  ALKPHOS 46  BILITOT 0.8  PROT 6.2*  ALBUMIN 2.8*   No results for input(s): LIPASE, AMYLASE in the last 168 hours. No results for input(s): AMMONIA in the last 168 hours.  ABG    Component Value Date/Time   PHART 7.362 11/14/2019 0351   PCO2ART 36.9 11/14/2019 0351   PO2ART 143 (H) 11/14/2019 0351   HCO3 21.0 11/14/2019 0351   TCO2 22 11/14/2019 0351   ACIDBASEDEF 4.0 (H) 11/14/2019 0351   O2SAT 99.0 11/14/2019 0351     Coagulation Profile: Recent Labs  Lab 11/13/19 2244  INR 1.0    Cardiac Enzymes: No results for input(s): CKTOTAL, CKMB, CKMBINDEX, TROPONINI in the last 168 hours.  HbA1C: No results found for: HGBA1C  CBG: Recent Labs  Lab 11/14/19  0230 11/14/19 0445  GLUCAP 76 85    Review of Systems:   Unable to obtain due to clinical status  Past Medical History  She,  has a past medical history of Atrial fibrillation ([place]), Atrial flutter ([place]), Chest pain, Ejection fraction, Fall, and H/O bladder repair surgery.   Surgical History    Past Surgical History:  Procedure Laterality Date  . IR CT HEAD LTD  11/14/2019  . IR PERCUTANEOUS ART THROMBECTOMY/INFUSION INTRACRANIAL INC DIAG ANGIO  11/14/2019  . IR US GUIDE VASC ACCESS RIGHT  11/14/2019  . no surgical hx       Social History   reports that she has quit smoking. She has never used smokeless tobacco. She reports current alcohol use. She reports that she does not use drugs.   Family History   Her family history is not on file.   Allergies Allergies  Allergen Reactions  . Bee Venom Anaphylaxis  . Mixed Vespid Venom Anaphylaxis  .  Codeine Other (See Comments)    Not known  . Sulfa Antibiotics Itching and Nausea Only  . Erythromycin Nausea And Vomiting     Home Medications  Prior to Admission medications   Medication Sig Start Date End Date Taking? Authorizing Provider  amoxicillin-clavulanate (AUGMENTIN) 875-125 MG tablet Take 1 tablet by mouth 2 (two) times daily. 11/12/19   [provider]  aspirin 81 MG tablet Take 1 tablet (81 mg total) by mouth daily. 01/08/13   Carlena Bjornstad, MD  atorvastatin (LIPITOR) 10 MG tablet Take 10 mg by mouth daily.    [provider]  BIOTIN PO Take 1 tablet by mouth daily.     [provider]  CALCIUM PO Take 1 tablet by mouth daily.     [provider]  Coenzyme Q10 (CO Q-10 PO) Take 1 tablet by mouth daily.     [provider]  EPINEPHrine 0.3 mg/0.3 mL IJ SOAJ injection INJECT INTRAMUSCULARLY AS DIRECTED FOR ANAPHYLAXIS 08/04/18   [provider]  Investigational - Study Medication Additional Study Details    [provider]  metoprolol succinate (TOPROL-XL) 50 MG 24 hr tablet Take 50 mg by mouth daily. Take with or immediately following a meal.    [provider]  nitrofurantoin (MACRODANTIN) 100 MG capsule Take 1 capsule by mouth at bedtime. 10/11/19   [provider]  trimethoprim (TRIMPEX) 100 MG tablet Take 100 mg by mouth as directed.    [provider]     Critical care time: 76

## 2019-11-14 NOTE — Progress Notes (Signed)
SLP Cancellation Note  Patient Details Name: [name] MRN: [id number] DOB: 1931/03/31   Cancelled treatment:        Pt required mechanical ventilation this morning. Will continue to check progress.    Houston Siren 11/14/2019, 8:23 AM  Orbie Pyo Colvin Caroli.Ed Risk analyst [phone] Office [phone]

## 2019-11-14 NOTE — Progress Notes (Signed)
RT NOTES: Transported patient from MRI back to room 2M15 on vent without complications.

## 2019-11-14 NOTE — Transfer of Care (Signed)
Immediate Anesthesia Transfer of Care Note  Patient: [name]  Procedure(s) Performed: IR WITH ANESTHESIA (N/A )  Patient Location: ICU  Anesthesia Type:General  Level of Consciousness: Patient remains intubated per anesthesia plan  Airway & Oxygen Therapy: Patient remains intubated per anesthesia plan and Patient placed on Ventilator (see vital sign flow sheet for setting)  Post-op Assessment: Report given to RN and Post -op Vital signs reviewed and stable  Post vital signs: Reviewed and stable  Last Vitals:  Vitals Value Taken Time  BP 136/54 11/14/19 0229  Temp    Pulse 70 11/14/19 0237  Resp 15 11/14/19 0237  SpO2 100 % 11/14/19 0237  Vitals shown include unvalidated device data.  Last Pain:  Vitals:   11/13/19 2259  PainSc: 0-No pain     Report to [facility] RN in 64M ICU, patient remained intubated per CT results, propofol and cleviprex gtt maintained, SBP within [place] MD goals, full report given, VSS, RT at bedside, applied to vent, PRVC mode, 100% FiO2, Charmion's son updated on plan of care via [place] MD and directed to patient's room, transfer of patient in safe and stable condition.    Complications: No complications documented.

## 2019-11-14 NOTE — Progress Notes (Signed)
Fentanyl 175cc wasted in sink by myself and Meda Klinefelter RN.

## 2019-11-14 NOTE — Progress Notes (Signed)
NeuroInterventional Radiology  Pre-Procedure Note  History: 86 yo female presents with acute right gaze preference and left side weakness.  Code Stroke activated.   Baseline mRS: 0  CT ASPECTS: 10 CTA:   Right PCA occlusion   I have discussed the case with Dr. Curly Sh[place] of Stroke Neurology.  Given the patient's symptoms, imaging findings, baseline function, we agree they are an appropriate candidate for attempt for mechanical thrombectomy.    The risks and benefits of the procedure were discussed with the patient/patient's family, with specific risks including: bleeding, infection, arterial injury/dissection, contrast reaction, kidney injury, need for further procedure/surgery, neurologic deficit, 10-15% risk of intracranial hemorrhage, cardiopulmonary collapse, death. All questions were answered.  The patient/family would like to proceed with attempt at thrombectomy.   Plan for cerebral angiogram and attempt at mechanical thrombectomy.   Signed,   Dulcy Fanny. Earleen Newport, DO

## 2019-11-14 NOTE — Procedures (Signed)
Extubation Procedure Note  Patient Details:   Name: [name] DOB: 1931/08/31 MRN: [id number]   Airway Documentation:   Patient extubated per orders, patient had positive cuff leak prior to extubation. Placed on 4lpm humidified oxygen. Tolerating well. Will continue to monitor. Vent end date: 11/14/19 Vent end time: 1016   Evaluation  O2 sats: stable throughout Complications: No apparent complications Patient did tolerate procedure well. Bilateral Breath Sounds: Clear, Diminished   Yes  Ander Purpura 11/14/2019, 10:17 AM

## 2019-11-14 NOTE — Anesthesia Procedure Notes (Signed)
Procedure Name: Intubation Date/Time: 11/14/2019 12:00 AM Performed by: Jearld Pies, CRNA Pre-anesthesia Checklist: Patient identified, Emergency Drugs available, Suction available and Patient being monitored Patient Re-evaluated:Patient Re-evaluated prior to induction Oxygen Delivery Method: Circle System Utilized Preoxygenation: Pre-oxygenation with 100% oxygen Induction Type: IV induction, Rapid sequence and Cricoid Pressure applied Laryngoscope Size: Glidescope and 3 Grade View: Grade I Tube type: Oral Tube size: 7.0 mm Number of attempts: 1 Airway Equipment and Method: Stylet and Video-laryngoscopy Placement Confirmation: ETT inserted through vocal cords under direct vision,  breath sounds checked- equal and bilateral and CO2 detector Secured at: 22 cm Tube secured with: Tape Dental Injury: Teeth and Oropharynx as per pre-operative assessment  Comments: Glidescope utilized d/t unknown COVID status

## 2019-11-14 NOTE — Consult Note (Addendum)
NAME:  [name], MRN:  [id number], DOB:  [birth year], LOS: 1 ADMISSION DATE:  11/13/2019, CONSULTATION DATE:  11/14/2010 REFERRING MD:  Lesleigh Noe CHIEF COMPLAINT:    Brief History   86 yo female with PMH significant for Afib on ASA only, admitted with R PCA occlusion.  S/p TPA and thrombectomy.   History of present illness    Patient presented to [facility] ED with acute onset of slurred speech, left sided weakness and right gaze.  GCS 15 on presentation. Last known well: 2115 10/19.  CODE stroke was called. Imaging revealed R PCA occlusion. Patient received TPA in ED. Post TPA she went emergently for thrombectomy where she was intubated. TICI 3. Post procedure CTH concerning for posterior fossa SAH vs hemorrhagic conversion of ischemic areas and therefore patient remained intubated.  Goal SBP 120-140.  On arrival, patient was not reversed from procedure. Pupils are 2 and reactive, no cough/gag/ext movement   Past Medical History  Afib --on asa  Significant Hospital Events   10/19 Admit 10/20   Consults:  10/19 Neurology 10/20 PCCM   Procedures:  10/20 left radial a line 10/20 intubation  Significant Diagnostic Tests:  10/19 CTA Brain:R PCA occlusion.  10/20: CTH:  Micro Data:  COVID negative  Antimicrobials:    Interim history/subjective:  New admit, see above  Objective   Blood pressure (!) 163/73, pulse 79, resp. rate 20, weight 66.4 kg, SpO2 97 %.        Intake/Output Summary (Last 24 hours) at 11/14/2019 0219 Last data filed at 11/14/2019 0149 Gross per 24 hour  Intake 1350 ml  Output 450 ml  Net 900 ml   Filed Weights   11/13/19 2200 11/13/19 2259  Weight: 66.4 kg 66.4 kg    Examination: General: Adult female, intubated and sedated HENT: [place]/AT Lungs: No secretions, symmetric expansion Cardiovascular: Afib, rate controlled, warm and well perfused with 2 +  Pulses to DP.  Abdomen: Soft, non distended Extremities: Warm and well perfused.  Right femoral access site without hematoma.  Neuro: Not reversed from OR: pupils 2 and reactive, no movement in extremities, no cough/gag.    Resolved Hospital Problem list     Assessment & Plan:  R PCA stroke s/p TPA and thrombectomy (TICI 3) -- Etiology likely Afib on no a/c -- TPA completed 10/19 2357, pending 24 hour follow up imaging -- Post thrombectomy CTH concerning for trace SAH vs contrast and right thalamus with contrast staining vs petechial hemorrhage.  -- No antiplt or a/c in setting of TPA -- Secondary stroke work up in progress (ECHO, lipid panel, hgbA1C) -- MRI B Pending -- Goal SBP 120-140 per Neurology -- Frequent neurological exams  Afib -- Afib with rate controlled on telemetry -- Hold home asa and metoprolol  HTN -- Cleviprex and PRN labetalol available  Intubated for airway protection -- Intubated for thrombectomy -- CXR pending -- Continue full vent support.   Best practice:  Diet: NPO Pain/Anxiety/Delirium protocol (if indicated): Propofol VAP protocol (if indicated): yes DVT prophylaxis: hold x 24 hours post tpa GI prophylaxis: pepcid Glucose control: n/a Mobility: bedrest Code Status: full Disposition: ICU.   Labs   CBC: Recent Labs  Lab 11/13/19 2244 11/13/19 2248  WBC 6.3  --   NEUTROABS 3.8  --   HGB 12.8 13.3  HCT 40.4 39.0  MCV 100.0  --   PLT 343  --     Basic Metabolic Panel: Recent Labs  Lab 11/13/19 2244 11/13/19  2248  NA 137 142  K 3.9 3.9  CL 103 103  CO2 24  --   GLUCOSE 107* 102*  BUN 20 20  CREATININE 0.83 0.70  CALCIUM 8.3*  --    GFR: CrCl cannot be calculated (Unknown ideal weight.). Recent Labs  Lab 11/13/19 2244  WBC 6.3    Liver Function Tests: Recent Labs  Lab 11/13/19 2244  AST 63*  ALT 61*  ALKPHOS 46  BILITOT 0.8  PROT 6.2*  ALBUMIN 2.8*   No results for input(s): LIPASE, AMYLASE in the last 168 hours. No results for input(s): AMMONIA in the last 168 hours.  ABG    Component  Value Date/Time   TCO2 27 11/13/2019 2248     Coagulation Profile: Recent Labs  Lab 11/13/19 2244  INR 1.0    Cardiac Enzymes: No results for input(s): CKTOTAL, CKMB, CKMBINDEX, TROPONINI in the last 168 hours.  HbA1C: No results found for: HGBA1C  CBG: No results for input(s): GLUCAP in the last 168 hours.  Review of Systems:   Unable to obtain due to clinical status  Past Medical History  She,  has a past medical history of Atrial fibrillation [facility]), Atrial flutter ([place]), Chest pain, Ejection fraction, Fall, and H/O bladder repair surgery.   Surgical History    Past Surgical History:  Procedure Laterality Date   no surgical hx       Social History   reports that she has quit smoking. She has never used smokeless tobacco. She reports current alcohol use. She reports that she does not use drugs.   Family History   Her family history is not on file.   Allergies Allergies  Allergen Reactions   Bee Venom Anaphylaxis   Mixed Vespid Venom Anaphylaxis   Codeine Other (See Comments)    Not known   Sulfa Antibiotics Itching and Nausea Only   Erythromycin Nausea And Vomiting     Home Medications  Prior to Admission medications   Medication Sig Start Date End Date Taking? Authorizing Provider  amoxicillin-clavulanate (AUGMENTIN) 875-125 MG tablet Take 1 tablet by mouth 2 (two) times daily. 11/12/19   [provider]  aspirin 81 MG tablet Take 1 tablet (81 mg total) by mouth daily. 01/08/13   Carlena Bjornstad, MD  atorvastatin (LIPITOR) 10 MG tablet Take 10 mg by mouth daily.    [provider]  BIOTIN PO Take 1 tablet by mouth daily.     [provider]  CALCIUM PO Take 1 tablet by mouth daily.     [provider]  Coenzyme Q10 (CO Q-10 PO) Take 1 tablet by mouth daily.     [provider]  EPINEPHrine 0.3 mg/0.3 mL IJ SOAJ injection INJECT INTRAMUSCULARLY AS DIRECTED FOR ANAPHYLAXIS 08/04/18   [provider]    Investigational - Study Medication Additional Study Details    [provider]  metoprolol succinate (TOPROL-XL) 50 MG 24 hr tablet Take 50 mg by mouth daily. Take with or immediately following a meal.    [provider]  nitrofurantoin (MACRODANTIN) 100 MG capsule Take 1 capsule by mouth at bedtime. 10/11/19   [provider]  trimethoprim (TRIMPEX) 100 MG tablet Take 100 mg by mouth as directed.    [provider]     Critical care time: 41

## 2019-11-14 NOTE — Progress Notes (Signed)
PT Cancellation Note  Patient Details Name: LANNAH KOIKE MRN: [id number] DOB: [birth year]   Cancelled Treatment:    Reason Eval/Treat Not Completed: Active bedrest order   Will hold today and check on for lifted bedrest and appropriateness of PT eval;   Roney Marion, [place] Pager [phone] Office [phone]    [name] 11/14/2019, 8:19 AM

## 2019-11-15 DIAGNOSIS — J9600 Acute respiratory failure, unspecified whether with hypoxia or hypercapnia: Secondary | ICD-10-CM | POA: Diagnosis not present

## 2019-11-15 DIAGNOSIS — I639 Cerebral infarction, unspecified: Secondary | ICD-10-CM | POA: Diagnosis not present

## 2019-11-15 DIAGNOSIS — I63531 Cerebral infarction due to unspecified occlusion or stenosis of right posterior cerebral artery: Secondary | ICD-10-CM | POA: Diagnosis not present

## 2019-11-15 DIAGNOSIS — I63431 Cerebral infarction due to embolism of right posterior cerebral artery: Secondary | ICD-10-CM | POA: Diagnosis not present

## 2019-11-15 LAB — BASIC METABOLIC PANEL
Anion gap: 8 (ref 5–15)
BUN: 7 mg/dL — ABNORMAL LOW (ref 8–23)
CO2: 22 mmol/L (ref 22–32)
Calcium: 7.9 mg/dL — ABNORMAL LOW (ref 8.9–10.3)
Chloride: 110 mmol/L (ref 98–111)
Creatinine, Ser: 0.71 mg/dL (ref 0.44–1.00)
GFR, Estimated: >60 mL/min (ref >60)
Glucose, Bld: 92 mg/dL (ref 70–99)
Potassium: 3.9 mmol/L (ref 3.5–5.1)
Sodium: 140 mmol/L (ref 135–145)

## 2019-11-15 LAB — CBC
HCT: 31.2 % — ABNORMAL LOW (ref 36.0–46.0)
Hemoglobin: 9.8 g/dL — ABNORMAL LOW (ref 12.0–15.0)
MCH: 31.5 pg (ref 26.0–34.0)
MCHC: 31.4 g/dL (ref 30.0–36.0)
MCV: 100.3 fL — ABNORMAL HIGH (ref 80.0–100.0)
Platelets: 261 10*3/uL (ref 150–400)
RBC: 3.11 MIL/uL — ABNORMAL LOW (ref 3.87–5.11)
RDW: 12.8 % (ref 11.5–15.5)
WBC: 7.6 10*3/uL (ref 4.0–10.5)
nRBC: 0 % (ref 0.0–0.2)

## 2019-11-15 LAB — HEPATIC FUNCTION PANEL
ALT: 32 U/L (ref 0–44)
AST: 24 U/L (ref 15–41)
Albumin: 2.4 g/dL — ABNORMAL LOW (ref 3.5–5.0)
Alkaline Phosphatase: 37 U/L — ABNORMAL LOW (ref 38–126)
Bilirubin, Direct: 0.2 mg/dL (ref 0.0–0.2)
Indirect Bilirubin: 0.7 mg/dL (ref 0.3–0.9)
Total Bilirubin: 0.9 mg/dL (ref 0.3–1.2)
Total Protein: 5.1 g/dL — ABNORMAL LOW (ref 6.5–8.1)

## 2019-11-15 LAB — HEMOGLOBIN A1C
Hgb A1c MFr Bld: 5.7 % — ABNORMAL HIGH (ref 4.8–5.6)
Mean Plasma Glucose: 117 mg/dL

## 2019-11-15 LAB — MAGNESIUM: Magnesium: 1.9 mg/dL (ref 1.7–2.4)

## 2019-11-15 MED ORDER — ASPIRIN EC 325 MG PO TBEC
325.0000 mg | DELAYED_RELEASE_TABLET | Freq: Every day | ORAL | Status: DC
  Administered 2019-11-15 – 2019-11-17 (×3): 325 mg via ORAL
  Filled 2019-11-15 (×3): qty 1

## 2019-11-15 MED ORDER — PANTOPRAZOLE SODIUM 40 MG PO TBEC
40.0000 mg | DELAYED_RELEASE_TABLET | Freq: Every day | ORAL | Status: DC
  Administered 2019-11-15 – 2019-11-23 (×8): 40 mg via ORAL
  Filled 2019-11-15 (×9): qty 1

## 2019-11-15 MED ORDER — LABETALOL HCL 5 MG/ML IV SOLN
5.0000 mg | INTRAVENOUS | Status: DC | PRN
  Administered 2019-11-19: 5 mg via INTRAVENOUS
  Administered 2019-11-22: 20 mg via INTRAVENOUS
  Filled 2019-11-15 (×2): qty 4

## 2019-11-15 MED ORDER — HEPARIN SODIUM (PORCINE) 5000 UNIT/ML IJ SOLN
5000.0000 [IU] | Freq: Three times a day (TID) | INTRAMUSCULAR | Status: AC
  Administered 2019-11-15 – 2019-11-17 (×7): 5000 [IU] via SUBCUTANEOUS
  Filled 2019-11-15 (×7): qty 1

## 2019-11-15 MED ORDER — WHITE PETROLATUM EX OINT
TOPICAL_OINTMENT | CUTANEOUS | Status: AC
  Filled 2019-11-15: qty 28.35

## 2019-11-15 NOTE — Evaluation (Signed)
Physical Therapy Evaluation Patient Details Name: [name] MRN: [id number] DOB: 09/04/1931 Today's Date: 11/15/2019   History of Present Illness  86 yo with acute right thalamic infarct s/p thrombectomy of Rt PCA 10/20 with revascularization, extubated 10/20. PMHx:Afib, SVT  Clinical Impression  Pt awake and willing to participate stating she is cold. Pt with good strength bil UE and LE 4/5 with ability to stand but limited balance and activity tolerance. Pt reports she was essentially lying in bed for 2 weeks PTA due to PNA and weak due to that. Pt with decreased cognition, balance, activity tolerance who will benefit from acute therapy to maximize mobility, safety and function to decrease burden of care. Daughter present throughout and confirms family can provide 24hr initial support.  SpO2 88-92% on RA with increased sats with cues for breathing technique HR 87    Follow Up Recommendations Home health PT;Supervision/Assistance - 24 hour    Equipment Recommendations  Rolling walker with 5" wheels    Recommendations for Other Services OT consult     Precautions / Restrictions Precautions Precautions: Fall Precaution Comments: watch sats Restrictions Weight Bearing Restrictions: No      Mobility  Bed Mobility Overal bed mobility: Needs Assistance Bed Mobility: Supine to Sit     Supine to sit: Min guard;HOB elevated     General bed mobility comments: HOB 20 degrees with increased time to exit toward right side of bed    Transfers Overall transfer level: Needs assistance   Transfers: Sit to/from Stand Sit to Stand: Min assist         General transfer comment: min assist to stand from bed with bil UE support  Ambulation/Gait Ambulation/Gait assistance: Min assist Gait Distance (Feet): 20 Feet Assistive device: 2 person hand held assist Gait Pattern/deviations: Step-through pattern;Decreased stride length;Shuffle   Gait velocity interpretation: 1.31 - 2.62  ft/sec, indicative of limited community ambulator General Gait Details: cues for safety, sequence and bil UE support for balance would benefit from RW for future trials, limited by fatigue  Stairs            Wheelchair Mobility    Modified Rankin (Stroke Patients Only)       Balance Overall balance assessment: Needs assistance   Sitting balance-Leahy Scale: Good Sitting balance - Comments: EOB without UE support   Standing balance support: Bilateral upper extremity supported Standing balance-Leahy Scale: Fair Standing balance comment: single and double UE support to stand                             Pertinent Vitals/Pain Pain Assessment: No/denies pain    Home Living Family/patient expects to be discharged to:: Private residence Living Arrangements: Spouse/significant other Available Help at Discharge: Family;Available 24 hours/day Type of Home: House Home Access: Stairs to enter Entrance Stairs-Rails: Right Entrance Stairs-Number of Steps: 1 Home Layout: One level Home Equipment: Cane - single point      Prior Function Level of Independence: Independent               Hand Dominance        Extremity/Trunk Assessment   Upper Extremity Assessment Upper Extremity Assessment: Overall WFL for tasks assessed    Lower Extremity Assessment Lower Extremity Assessment: Overall WFL for tasks assessed    Cervical / Trunk Assessment Cervical / Trunk Assessment: Kyphotic  Communication   Communication: No difficulties  Cognition Arousal/Alertness: Awake/alert Behavior During Therapy: WFL for tasks assessed/performed Overall  Cognitive Status: Impaired/Different from baseline Area of Impairment: Memory;Safety/judgement                     Memory: Decreased short-term memory         General Comments: pt asking you man in hall was (MD), stated hospital as "[place]" and day as "wednesday" but otherwise appropriate and following  all commands      General Comments      Exercises     Assessment/Plan    PT Assessment Patient needs continued PT services  PT Problem List Decreased mobility;Decreased activity tolerance;Decreased balance;Decreased cognition;Cardiopulmonary status limiting activity;Decreased knowledge of use of DME       PT Treatment Interventions DME instruction;Therapeutic exercise;Gait training;Balance training;Stair training;Functional mobility training;Therapeutic activities;Patient/family education;Cognitive remediation;Neuromuscular re-education    PT Goals (Current goals can be found in the Care Plan section)  Acute Rehab PT Goals Patient Stated Goal: return home to see my dogs and eat PT Goal Formulation: With patient/family Time For Goal Achievement: 11/29/19 Potential to Achieve Goals: Good    Frequency Min 3X/week   Barriers to discharge        Co-evaluation               AM-PAC PT "6 Clicks" Mobility  Outcome Measure Help needed turning from your back to your side while in a flat bed without using bedrails?: A Little Help needed moving from lying on your back to sitting on the side of a flat bed without using bedrails?: A Little Help needed moving to and from a bed to a chair (including a wheelchair)?: A Little Help needed standing up from a chair using your arms (e.g., wheelchair or bedside chair)?: A Little Help needed to walk in hospital room?: A Little Help needed climbing 3-5 steps with a railing? : A Lot 6 Click Score: 17    End of Session Equipment Utilized During Treatment: Gait belt Activity Tolerance: Patient tolerated treatment well Patient left: in chair;with call bell/phone within reach;with family/visitor present Nurse Communication: Mobility status;Precautions PT Visit Diagnosis: Difficulty in walking, not elsewhere classified (R26.2);Other abnormalities of gait and mobility (R26.89)    Time: 1443-1540 PT Time Calculation (min) (ACUTE ONLY): 24  min   Charges:   PT Evaluation $PT Eval Moderate Complexity: 1 Mod PT Treatments $Gait Training: 8-22 mins        Fines Kimberlin P, PT Acute Rehabilitation Services Pager: [phone] Office: [place] 11/15/2019, 9:58 AM

## 2019-11-15 NOTE — Plan of Care (Signed)
  Problem: Education: Goal: Knowledge of disease or condition will improve Outcome: Progressing Goal: Knowledge of secondary prevention will improve Outcome: Progressing Goal: Knowledge of patient specific risk factors addressed and post discharge goals established will improve Outcome: Progressing   Problem: Self-Care: Goal: Verbalization of feelings and concerns over difficulty with self-care will improve Outcome: Progressing Goal: Ability to communicate needs accurately will improve Outcome: Progressing   Problem: Nutrition: Goal: Dietary intake will improve Outcome: Progressing   Problem: Coping: Goal: Will verbalize positive feelings about self Outcome: Progressing Goal: Will identify appropriate support needs Outcome: Progressing

## 2019-11-15 NOTE — Progress Notes (Signed)
STROKE TEAM PROGRESS NOTE   INTERVAL HISTORY RN and daughter at bedside.  Patient sitting in chair, awake alert, interactive, no acute distress.  Still has sore throat, however able to speak with voice out, not whisper anymore.  Still has left upper extremity drift, left lower extremity much improved from yesterday.  MRI done showed right thalamic stroke with stable small SAH.  OBJECTIVE Vitals:   11/15/19 1200 11/15/19 1300 11/15/19 1400 11/15/19 1506  BP: (!) 127/91 139/60 132/82   Pulse: 73 65 72   Resp: (!) 23 20 19    Temp:    97.6 F (36.4 C)  TempSrc:    Axillary  SpO2: 97% 97% 96%   Weight:      Height:       CBC:  Recent Labs  Lab 11/13/19 2244 11/13/19 2248 11/14/19 0351 11/15/19 0408  WBC 6.3  --   --  7.6  NEUTROABS 3.8  --   --   --   HGB 12.8   < > 10.5* 9.8*  HCT 40.4   < > 31.0* 31.2*  MCV 100.0  --   --  100.3*  PLT 343  --   --  261   < > = values in this interval not displayed.   Basic Metabolic Panel:  Recent Labs  Lab 11/13/19 2244 11/13/19 2244 11/13/19 2248 11/13/19 2248 11/14/19 0351 11/15/19 0408  NA 137   < > 142   < > 144 140  K 3.9   < > 3.9   < > 3.1* 3.9  CL 103   < > 103  --   --  110  CO2 24  --   --   --   --  22  GLUCOSE 107*   < > 102*  --   --  92  BUN 20   < > 20  --   --  7*  CREATININE 0.83   < > 0.70  --   --  0.71  CALCIUM 8.3*  --   --   --   --  7.9*  MG  --   --   --   --   --  1.9   < > = values in this interval not displayed.   Lipid Panel:     Component Value Date/Time   CHOL 70 11/14/2019 0408   TRIG 126 11/14/2019 0408   TRIG 127 11/14/2019 0408   HDL 19 (L) 11/14/2019 0408   CHOLHDL 3.7 11/14/2019 0408   VLDL 25 11/14/2019 0408   LDLCALC 26 11/14/2019 0408   HgbA1c:  Lab Results  Component Value Date   HGBA1C 5.7 (H) 11/14/2019   Urine Drug Screen: No results found for: LABOPIA, COCAINSCRNUR, LABBENZ, AMPHETMU, THCU, LABBARB  Alcohol Level     Component Value Date/Time   ETH <10 11/13/2019 2244     IMAGING  CT Code Stroke CTA Head W/WO contrast CT Code Stroke CTA Neck W/WO contrast 11/13/2019 IMPRESSION:  1. Positive for occlusion of the Right PCA origin.  2. No other large vessel occlusion, and generally mild for age atherosclerosis in the head and neck.  3. Emphysema (ICD10-J43.9) with layering left pleural effusion, patchy left upper lobe opacity compatible with pneumonia in this setting.  4. Aortic Atherosclerosis (ICD10-I70.0)   CT HEAD WO CONTRAST 11/14/2019 IMPRESSION:  1. Status post Right PCA endovascular revascularization with trace subarachnoid blood/contrast in the interpeduncular cistern.  2. Small 10 mm area of contrast staining versus petechial hemorrhage  in the medial right thalamus. And question subtle increased right thalamic hypodensity elsewhere, but no other Right PCA territory infarct changes are identified.   MR BRAIN WO CONTRAST MR ANGIO HEAD WO CONTRAST 11/14/2019 IMPRESSION:  Acute infarct right thalamus with associated mild hemorrhage as noted on recent CT. There is also mild subarachnoid hemorrhage in the interpeduncular cistern unchanged from CT. Additional small areas of acute infarct in the right cerebellum and right frontal cortex. Moderate atrophy. Chronic microvascular ischemic change in the white matter. Right PCA patent post thrombectomy. There is a mild stenosis in the right PCA at the P1/ P2 junction.   [place] 11/14/2019 IMPRESSION:  Status post ultrasound guided access right common femoral artery for cervical and cerebral angiogram and mechanical thrombectomy of right posterior cerebral artery for treatment of posterior circulation ELVO, with TICI 3 flow at the conclusion. PLAN: The patient will remain intubated, as small volume subarachnoid hemorrhage was confirmed with formal CT ICU status Target systolic blood pressure of 120-140 Right hip straight time 6 hours Frequent neurovascular checks Repeat neurologic imaging with CT  and/MRI at the discretion of neurology team.  DG CHEST PORT 1 VIEW 11/14/2019 IMPRESSION: Endotracheal tube placed with tip measuring 4.7 cm above the carina. Cardiac enlargement with small bilateral pleural effusions. Probable chronic interstitial changes.   CT HEAD CODE STROKE WO CONTRAST 11/13/2019 IMPRESSION:  Age indeterminate small vessel disease including in the right thalamus, but no acute cortically based infarct or acute intracranial hemorrhage identified. ASPECTS 10.   Transthoracic Echocardiogram  11/14/2019 1. Left ventricular ejection fraction, by estimation, is 60 to 65%. The left ventricle has normal function. The left ventricle has no regional wall motion abnormalities. There is severe asymmetric left ventricular hypertrophy of the basal-septal segment. Left ventricular diastolic function could not be evaluated.  2. Right ventricular systolic function is normal. The right ventricular size is normal. There is mildly elevated pulmonary artery systolic pressure.  3. Left atrial size was moderately dilated.  4. A small pericardial effusion is present. The pericardial effusion is circumferential.  5. The mitral valve is normal in structure. Trivial mitral valve regurgitation. No evidence of mitral stenosis. Moderate mitral annular calcification.  6. The aortic valve is tricuspid. There is mild calcification of the aortic valve. There is mild thickening of the aortic valve. Aortic valve regurgitation is trivial. Mild aortic valve sclerosis is present, with no evidence of aortic valve stenosis.  7. The inferior vena cava is normal in size with greater than 50% respiratory variability, suggesting right atrial pressure of 3 mmHg.   ECG - atrial fibrillation - ventricular response 87 BPM (See cardiology reading for complete details)   PHYSICAL EXAM  Temp:  [97.6 F (36.4 C)-99.3 F (37.4 C)] 97.6 F (36.4 C) (10/21 1506) Pulse Rate:  [65-89] 72 (10/21 1400) Resp:  [15-26]  19 (10/21 1400) BP: (115-149)/(60-125) 132/82 (10/21 1400) SpO2:  [86 %-100 %] 96 % (10/21 1400) Arterial Line BP: (99-178)/(53-76) 145/58 (10/21 0900)  General - Well nourished, well developed, no acute distress.  Ophthalmologic - fundi not visualized due to noncooperation.  Cardiovascular - irregularly irregular heart rate and rhythm.  Mental Status -  Level of arousal and orientation to place, age, month and person were intact, however, not orientated to year. Language exam showed in low voice due to sore throat with recent intubation.  No aphasia, able to name and repeat, follow simple commands.    Cranial Nerves II - XII - II - Visual field  intact OU. III, IV, VI - Extraocular movements intact. V - Facial sensation intact bilaterally. VII - subtle left nasolabial fold flattening. VIII - Hearing & vestibular intact bilaterally. X - Palate elevates symmetrically. XI - Chin turning & shoulder shrug intact bilaterally. XII - Tongue protrusion intact.  Motor Strength - The patient's strength was normal in right upper and lower extremities, left upper extremity drift with mildly decreased finger grip, left LE 4/5 proximal and 4+/5 distally.  Bulk was normal and fasciculations were absent.   Motor Tone - Muscle tone was assessed at the neck and appendages and was normal.  Reflexes - The patient's reflexes were symmetrical in all extremities and she had no pathological reflexes.  Sensory - Light touch, temperature/pinprick were assessed and were symmetrical.    Coordination - The patient had normal movements in the hands with no ataxia or dysmetria.  Tremor was absent.  Gait and Station - deferred.   ASSESSMENT/PLAN [name] is a 86 y.o. female with history of Afib/flutter s/p ablation, off AC due to persistent sinus rhythm, and a recent diagnosis of pneumonia (she was not improving on the first round of antibiotics tried and therefore was broadened to Augmentin on  Monday), presenting with acute onset right gaze preference and left-sided weakness. The patient received IV t-PA Tuesday 11/19 at 10:59 PM and sent to IR for R PCA occlusion.  Stroke: R thalamic, cerebellar, and frontal small infarcts with right PCA occlusion s/p tPA and IR with TICI3 from AF not on AC Small SAH interpeduncular cistern post IR  CT Head - Age indeterminate small vessel disease including in the right thalamus, but no acute cortically based infarct or acute intracranial hemorrhage identified.   CTA H&N - Positive for occlusion of the Right PCA origin. No other large vessel occlusion, and generally mild for age atherosclerosis in the head and neck.   Cerebral angio/ IR - Rt PCA occlusion with TICI 3 flow   CT head - Status post Right PCA endovascular revascularization with trace subarachnoid blood/contrast in the interpeduncular cistern. Small 10 mm area of contrast staining versus petechial hemorrhage in the medial right thalamus.   MRI head - R thalamus infarct with associated mild hemorrhage as noted on recent CT. There is also mild subarachnoid hemorrhage in the interpeduncular cistern unchanged from CT. Additional small areas of acute infarct in the right cerebellum and right frontal cortex. Moderate atrophy   MRA head -  Right PCA patent post thrombectomy. There is a mild stenosis in the right PCA at the P1/ P2 junction.   2D Echo - EF 60-65%. No source of embolus. LA moderately dilated  [employer] Virus 2 - negative  LDL - 26  HgbA1c - 5.7  VTE prophylaxis - Heparin 5000 units sq tid   aspirin 81 mg daily prior to admission, now on aspirin 325 mg daily. Consider AC once SAH resolved (probably in one week)    Therapy recommendations:  HH OT, HH PT  Disposition:  Pending  Acute Respiratory Failure  Intubated for IR, left intubated post IR   Sedated w/ fentanyl and propofol, now off  Extubated 10/20  Patient stable  CCM signed off   Atrial  Fibrillation/Aflutter  Home anticoagulation:  none   post ablation Dr. Ashley Mariner 2004. Doing very well. No further recurrences.  No AC now given post tPA small SAH   Now on aspirin 325  Consider AC once SAH resolved (probably in 1 week)  Follows with Dr. Marlou Porch at [place] Acquired PNA  No response to intial abx  Broadened as OP to Augmention 10/18  Still has intermittent coughing  CTA - layering left pleural effusion, patchy left upper lobe opacity compatible with pneumonia  CXR 10/20 - small bilateral pleural effusions. Probable chronic interstitial changes.  WBCs 6.3->7.6 (afebrile)  Hypertension  Home BP meds: metoprolol   Treated short term w/ cleviprex, off this am  BP stable . BP goal < 160  . Long-term BP goal normotensive  Hyperlipidemia  Home Lipid lowering medication: Lipitor 10 mg daily  LDL 26, goal < 70  Will not resume lipitor due to low LDL level  Dysphagia, resolved . Secondary to stroke . Cleared for regular consistency, thin liquid diet . Off IVF  . Sore throat after extubation   Other Stroke Risk Factors  Advanced age  Former cigarette smoker - quit  ETOH use, advised to drink no more than 1 alcoholic beverage per day.  Other Active Problems  Code status - Full code  Emphysema (RCV89-F81.9)   Aortic Atherosclerosis (ICD10-I70.0)   Anemia - Hgb - 12.8->13.3-> 10.5->9.8  Hypokalemia - potassium - 3.1 -> 3.9  Hospital day # 2  This patient is critically ill due to thalamic stroke due to PCA occlusion status post thrombectomy and TPA, A. fib and at significant risk of neurological worsening, death form recurrent stroke, hemorrhagic conversion, bleeding, heart failure. This patient's care requires constant monitoring of vital signs, hemodynamics, respiratory and cardiac monitoring, review of multiple databases, neurological assessment, discussion with family, other specialists and medical decision making of  high complexity. I spent 35 minutes of neurocritical care time in the care of this patient. I had long discussion with patient and daughter at bedside, updated pt current condition, treatment plan and potential prognosis, and answered all the questions.  They expressed understanding and appreciation.    Rosalin Hawking, MD PhD Stroke Neurology 11/15/2019 3:12 PM    To contact Stroke Continuity provider, please refer to [email or web address] After hours, contact General Neurology

## 2019-11-15 NOTE — Evaluation (Addendum)
Occupational Therapy Evaluation Patient Details Name: [name] MRN: [id number] DOB: 1931/11/17 Today's Date: 11/15/2019    History of Present Illness 86 yo with acute right thalamic infarct s/p thrombectomy of Rt PCA 10/20 with revascularization, extubated 10/20. PMHx:Afib, SVT   Clinical Impression   PTA patient independent, driving. Admitted for above and limited by problem list below, including impaired cognition, generalized weakness, decreased activity tolerance, L sided mild dis-coordination and edema, impaired balance.   Patient oriented, follows most commands but difficulty with attending to multiple step commands, decreased awareness of deficits, problem solving and decreased STM.  Patient currently requires min assist for transfers using RW, min assist for ADLs.  She presents with L lateral lean when sitting statically and engaging in tasks, but no awareness to L lateral lean, demonstrates decreased L coordination during testing reporting "that's different" but then later states "I don't remember saying that" when discussed deficits seen. Encouraged L UE elevation and exercises for edema reduction. VSS during session on RA.  Patient will benefit from continued OT services while admitted and after dc at [facility] level, given 24/7 support.  Discussed recommendation and assist from family for IADls (med mgmt) at this time.  Will follow acutely.      Follow Up Recommendations  Home health OT;Supervision/Assistance - 24 hour    Equipment Recommendations  3 in 1 bedside commode    Recommendations for Other Services       Precautions / Restrictions Precautions Precautions: Fall Precaution Comments: watch sats Restrictions Weight Bearing Restrictions: No      Mobility Bed Mobility Overal bed mobility: Needs Assistance Bed Mobility: Supine to Sit     Supine to sit: Min guard;HOB elevated     General bed mobility comments: OOB upon entry in recliner     Transfers Overall  transfer level: Needs assistance   Transfers: Sit to/from Stand Sit to Stand: Min assist         General transfer comment: min assist to steady with cueing for hand placement     Balance Overall balance assessment: Needs assistance Sitting-balance support: No upper extremity supported;Feet supported Sitting balance-Leahy Scale: Fair Sitting balance - Comments: seated unsupported with supervision, with tasks noted L lateral drift requiring cueing to reposition to midline (poor awareness)   Standing balance support: Bilateral upper extremity supported Standing balance-Leahy Scale: Fair Standing balance comment: single and double UE support to stand                           ADL either performed or assessed with clinical judgement   ADL Overall ADL's : Needs assistance/impaired     Grooming: Minimal assistance;Sitting   Upper Body Bathing: Minimal assistance;Sitting   Lower Body Bathing: Minimal assistance;Sit to/from stand   Upper Body Dressing : Minimal assistance;Sitting   Lower Body Dressing: Minimal assistance;Sit to/from Health and safety inspector Details (indicate cue type and reason): deferred, pt fatigued         Functional mobility during ADLs: Minimal assistance;Rolling walker;Cueing for safety;Cueing for sequencing General ADL Comments: pt limited by decreased activity tolerance, L inattention/awareness, L UE edema and decreased coordination      Vision Baseline Vision/History: No visual deficits Patient Visual Report: No change from baseline Vision Assessment?: Yes Eye Alignment: Within Functional Limits Ocular Range of Motion: Within Functional Limits Alignment/Gaze Preference: Within Defined Limits Tracking/Visual Pursuits:  (requires cueing to avoid head turns ) Visual Fields: No apparent deficits Additional Comments:  continue assessment- appears WFL but some difficutly attending to and following multiple step commands when fatigued      Perception     Praxis      Pertinent Vitals/Pain Pain Assessment: No/denies pain     Hand Dominance Right   Extremity/Trunk Assessment Upper Extremity Assessment Upper Extremity Assessment: LUE deficits/detail LUE Deficits / Details: decreased coordination, edema in UE  LUE Sensation: decreased light touch LUE Coordination: decreased fine motor;decreased gross motor   Lower Extremity Assessment Lower Extremity Assessment: Defer to PT evaluation   Cervical / Trunk Assessment Cervical / Trunk Assessment: Kyphotic   Communication Communication Communication: No difficulties (hoarse voice )   Cognition Arousal/Alertness: Awake/alert Behavior During Therapy: WFL for tasks assessed/performed Overall Cognitive Status: Impaired/Different from baseline Area of Impairment: Memory;Safety/judgement;Problem solving;Awareness;Attention;Following commands                   Current Attention Level: Sustained Memory: Decreased short-term memory Following Commands: Follows one step commands consistently;Follows one step commands with increased time;Follows multi-step commands inconsistently Safety/Judgement: Decreased awareness of deficits;Decreased awareness of safety Awareness: Emergent Problem Solving: Difficulty sequencing;Requires verbal cues General Comments: pt following commands with increased time, oriented and coopeartive, noted decreased STM and awareness to deficits    General Comments  daughter present and supportive, agreeable to recommendations for assist with meds at this time     Exercises     Shoulder Instructions      Home Living Family/patient expects to be discharged to:: Private residence Living Arrangements: Spouse/significant other Available Help at Discharge: Family;Available 24 hours/day Type of Home: House Home Access: Stairs to enter [employer] of Steps: 1 Entrance Stairs-Rails: Right Home Layout: One level     Bathroom  Shower/Tub: Occupational psychologist: Handicapped height     Home Equipment: Cane - single point          Prior Functioning/Environment Level of Independence: Independent        Comments: reports independent and driving, managing her medication (takes care of her husbands meds too)        OT Problem List: Decreased strength;Decreased activity tolerance;Impaired balance (sitting and/or standing);Decreased coordination;Decreased cognition;Decreased safety awareness;Decreased knowledge of use of DME or AE;Decreased knowledge of precautions;Impaired UE functional use;Increased edema;Impaired sensation      OT Treatment/Interventions: Self-care/ADL training;DME and/or AE instruction;Therapeutic exercise;Therapeutic activities;Cognitive remediation/compensation;Patient/family education;Balance training;Energy conservation    OT Goals(Current goals can be found in the care plan section) Acute Rehab OT Goals Patient Stated Goal: return home  OT Goal Formulation: With patient Time For Goal Achievement: 11/29/19 Potential to Achieve Goals: Good  OT Frequency: Min 3X/week   Barriers to D/C:            Co-evaluation              AM-PAC OT "6 Clicks" Daily Activity     Outcome Measure Help from another person eating meals?: A Little Help from another person taking care of personal grooming?: A Little Help from another person toileting, which includes using toliet, bedpan, or urinal?: A Little Help from another person bathing (including washing, rinsing, drying)?: A Little Help from another person to put on and taking off regular upper body clothing?: A Little Help from another person to put on and taking off regular lower body clothing?: A Little 6 Click Score: 18   End of Session Equipment Utilized During Treatment: Rolling walker Nurse Communication: Mobility status  Activity Tolerance: Patient tolerated treatment well Patient left: in chair;with call  bell/phone  within reach;with family/visitor present  OT Visit Diagnosis: Other abnormalities of gait and mobility (R26.89);Muscle weakness (generalized) (M62.81);Other symptoms and signs involving cognitive function;Other symptoms and signs involving the nervous system (R29.898)                Time: 3903-0092 OT Time Calculation (min): 26 min Charges:  OT General Charges $OT Visit: 1 Visit OT Evaluation $OT Eval Moderate Complexity: 1 Mod OT Treatments $Self Care/Home Management : 8-22 mins  Jolaine Artist, OT Acute Rehabilitation Services Pager [phone] Office [phone]   Delight Stare 11/15/2019, 12:25 PM

## 2019-11-15 NOTE — Progress Notes (Signed)
Called re: swelling and tenderness at puncture site. There is a moderate sized non-pulsitile mass(likley hematoma) overlying the femoral artery puncture site in the right leg. R foot is warm with good DP pulse. Discussed with Dr. Jacqualyn Posey, who advised pressure at puncture site x 10 minutes, marking margins with marker, and getting duplex ultrasoudn in AM to assess for pseudoaneurysm.   Roland Rack, MD [facility] [phone]  If 7pm- 7am, please page neurology on call as listed in [place].

## 2019-11-16 ENCOUNTER — Inpatient Hospital Stay (HOSPITAL_COMMUNITY): Payer: Medicare Other

## 2019-11-16 ENCOUNTER — Encounter (HOSPITAL_COMMUNITY): Payer: Medicare Other

## 2019-11-16 DIAGNOSIS — I724 Aneurysm of artery of lower extremity: Secondary | ICD-10-CM | POA: Diagnosis not present

## 2019-11-16 DIAGNOSIS — I63531 Cerebral infarction due to unspecified occlusion or stenosis of right posterior cerebral artery: Secondary | ICD-10-CM | POA: Diagnosis not present

## 2019-11-16 DIAGNOSIS — I639 Cerebral infarction, unspecified: Secondary | ICD-10-CM | POA: Diagnosis not present

## 2019-11-16 DIAGNOSIS — I729 Aneurysm of unspecified site: Secondary | ICD-10-CM | POA: Diagnosis not present

## 2019-11-16 LAB — CBC
HCT: 27 % — ABNORMAL LOW (ref 36.0–46.0)
Hemoglobin: 8.8 g/dL — ABNORMAL LOW (ref 12.0–15.0)
MCH: 31.8 pg (ref 26.0–34.0)
MCHC: 32.6 g/dL (ref 30.0–36.0)
MCV: 97.5 fL (ref 80.0–100.0)
Platelets: 267 10*3/uL (ref 150–400)
RBC: 2.77 MIL/uL — ABNORMAL LOW (ref 3.87–5.11)
RDW: 12.4 % (ref 11.5–15.5)
WBC: 7.3 10*3/uL (ref 4.0–10.5)
nRBC: 0 % (ref 0.0–0.2)

## 2019-11-16 LAB — BASIC METABOLIC PANEL
Anion gap: 6 (ref 5–15)
BUN: 6 mg/dL — ABNORMAL LOW (ref 8–23)
CO2: 27 mmol/L (ref 22–32)
Calcium: 7.9 mg/dL — ABNORMAL LOW (ref 8.9–10.3)
Chloride: 105 mmol/L (ref 98–111)
Creatinine, Ser: 0.71 mg/dL (ref 0.44–1.00)
GFR, Estimated: >60 mL/min (ref >60)
Glucose, Bld: 119 mg/dL — ABNORMAL HIGH (ref 70–99)
Potassium: 3.8 mmol/L (ref 3.5–5.1)
Sodium: 138 mmol/L (ref 135–145)

## 2019-11-16 MED ORDER — THROMBIN FOR PERCUTANEOUS TREATMENT OF PSEUDOANEURYSM (5000UNITS/10ML)
PERCUTANEOUS | Status: DC
  Filled 2019-11-16: qty 1

## 2019-11-16 MED ORDER — LIDOCAINE HCL (PF) 1 % IJ SOLN
INTRAMUSCULAR | Status: AC
  Filled 2019-11-16: qty 30

## 2019-11-16 MED ORDER — ORAL CARE MOUTH RINSE: 15.0000 mL | Freq: Four times a day (QID) | OROMUCOSAL | Status: DC

## 2019-11-16 NOTE — Progress Notes (Signed)
Pt has bed 3W11. Report given to [place], Therapist, sports.  Pt transported by bed. Pts son at bedside.

## 2019-11-16 NOTE — Progress Notes (Addendum)
Interventional Radiology Progress/Preprocedure Note   Ms [name] is 86 yo female SP right CFA access for treatment of ELVO, Angioseal for hemostasis.   She has had delayed hemorrhage of the access site last night, with PSA confirmed today on duplex.  Associated hematoma.   The configuration of the pseudoaneurysm is amenable for US guided thrombin injection.    Doppler signal positive in PT and DP.  I have discussed the result with the patient and her son, and the need for therapy.  Regarding options, I did let them know 2 options include formal surgery versus thrombin injection.  I think with the narrow neck there is good chance that thrombin injection will take care of the problem, and we can avoid another OR trip with GETA.    Risks discussed included causing additional bleeding, infection, need for additional surgery/intervention, including possible formal surgery for repair, limb ischemia including tissue loss, cardiopulmonary collapse/death.   After our discussion, she would like to proceed with the US guided injection as the initial step.   Consent is signed and in the chart.   Plan to proceed ASAP for US guided thrombin injection.   Signed,  Dulcy Fanny. Earleen Newport, DO

## 2019-11-16 NOTE — Progress Notes (Signed)
OT Cancellation Note  Patient Details Name: [name] MRN: [id number] DOB: [birth year]   Cancelled Treatment:    Reason Eval/Treat Not Completed: Active bedrest order. Pt s/p thrombin injection and on bedrest until the AM.   [place], OTR/L Acute Rehab Services Pager [phone] Office [phone]     Almon Register 11/16/2019, 4:14 PM

## 2019-11-16 NOTE — Evaluation (Signed)
Speech Language Pathology Evaluation Patient Details Name: [name] MRN: [id number] DOB: Jul 19, 1931 Today's Date: 11/16/2019 Time: 0938-1829 SLP Time Calculation (min) (ACUTE ONLY): 27 min  Problem List:  Patient Active Problem List   Diagnosis Date Noted  . Acute ischemic right PCA stroke ([place]) 11/14/2019  . Acute respiratory failure ([place])   . Stroke ([place]) 11/13/2019  . Hymenoptera allergy 10/05/2014  . SVT (supraventricular tachycardia) ([place]) 10/20/2012  . Fall   . Chest pain   . Ejection fraction   . Atrial fibrillation ([place])   . Atrial flutter [facility])    Past Medical History:  Past Medical History:  Diagnosis Date  . Atrial fibrillation ([place])    Prior treatment with amiodarone, held sinus rhythm and drug stopped  . Atrial flutter ([place])    Status post ablation prior to  2009  . Chest pain    Nuclear, 2004, no scar or ischemia, EF 77%  . Ejection fraction    EF 70%, nuclear,  2004  //     . Fall    July, 2014  . H/O bladder repair surgery    Past Surgical History:  Past Surgical History:  Procedure Laterality Date  . IR CT HEAD LTD  11/14/2019  . IR PERCUTANEOUS ART THROMBECTOMY/INFUSION INTRACRANIAL INC DIAG ANGIO  11/14/2019  . IR US GUIDE VASC ACCESS RIGHT  11/14/2019  . no surgical hx    . RADIOLOGY WITH ANESTHESIA N/A 11/13/2019   Procedure: IR WITH ANESTHESIA;  Surgeon: Luanne Bras, MD;  Location: [facility];  Service: Radiology;  Laterality: N/A;   HPI:  86 y.o. female with a PMHx of Afib/flutter s/p ablation, off AC due to persistent sinus rhythm, and a recent diagnosis of pneumonia, presenting with acute onset right gaze preference and left-sided weakness. MRI head on 11/14/19 indicated Acute infarct right thalamus with associated mild hemorrhage as noted on recent CT. There is also mild subarachnoid hemorrhage in the interpeduncular cistern unchanged from CT. Additional small areas of acute infarct in the right cerebellum and right frontal  cortex.  Assessment / Plan / Recommendation Clinical Impression  Pt administered the SLUMS ([place] Mental Status Examination) with a score obtained of 20/30 which falls below expected levels for normal range of 27/30.  Pt exhibited deficits in the areas of memory, calculation, organization, visual-spatial awareness and attention/safety awareness/problem solving.  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  Pt with decreased attention to left during SLE , decreased safety awareness re: current deficits/functional problem solving.  Auditory comprehension appeared good for paragraph retention, but pt unable to follow multi-step directives.  Thought organization/information processing also appear to be affected with various functional tasks during SLE.  Intelligibility during conversation good with 100% accuracy noted and no dysarthria evident during speaking tasks.  PT denies dysphagia and passed swallow screen.  Recommend ST f/u for cognitive deficits while in acute setting.  Thank you for this consult.    SLP Assessment  SLP Recommendation/Assessment: Patient needs continued Speech Language Pathology Services SLP Visit Diagnosis: Attention and concentration deficit;Cognitive communication deficit (R41.841) Attention and concentration deficit following: Cerebral infarction    Follow Up Recommendations  Other (comment) (TBD)    Frequency and Duration min 2x/week  1 week      SLP Evaluation Cognition  Overall Cognitive Status: Impaired/Different from baseline Arousal/Alertness: Awake/alert Orientation Level: Oriented to person;Oriented to place;Oriented to situation;Disoriented to time Attention: Sustained Sustained Attention: Impaired Sustained Attention Impairment:  Verbal basic;Functional basic Memory: Impaired Memory Impairment: Retrieval deficit;Decreased recall of new information;Decreased short term  memory Decreased Short Term Memory: Verbal basic;Functional basic Immediate Memory Recall: Sock;Blue;Bed Memory Recall Sock: With Cue Memory Recall Blue: Not able to recall Memory Recall Bed: Without Cue Awareness: Impaired Awareness Impairment: Emergent impairment Problem Solving: Impaired Problem Solving Impairment: Verbal complex;Functional complex Executive Function: Writer: Impaired Organizing Impairment: Verbal basic;Functional basic Safety/Judgment: Impaired Comments: Stated she could go home and "function close to normal"       Comprehension  Auditory Comprehension Overall Auditory Comprehension: Impaired Yes/No Questions: Within Functional Limits Commands: Impaired Complex Commands: 50-74% accurate Conversation: Complex Visual Recognition/Discrimination Discrimination: Not tested Reading Comprehension Reading Status: Unable to assess (comment) (visual deficits d/t inattention)    Expression Expression Primary Mode of Expression: Verbal Verbal Expression Overall Verbal Expression: Appears within functional limits for tasks assessed Initiation: No impairment Level of Generative/Spontaneous Verbalization: Conversation Repetition: No impairment Naming: No impairment Pragmatics: No impairment Interfering Components: Attention Effective Techniques:  (repetition) Non-Verbal Means of Communication: Not applicable Written Expression Dominant Hand: Right Written Expression: Exceptions to [facility] Interfering Components:  (visual deficit)   Oral / Motor  Oral Motor/Sensory Function Overall Oral Motor/Sensory Function: Within functional limits Motor Speech Overall Motor Speech: Appears within functional limits for tasks assessed Respiration: Within functional limits Phonation: Normal Resonance: Within functional limits Articulation: Within functional limitis Intelligibility: Intelligible Motor Planning: Witnin functional limits Motor Speech Errors: Not  applicable                     Elvina Sidle, M.S., CCC-SLP 11/16/2019, 11:58 AM

## 2019-11-16 NOTE — Progress Notes (Signed)
Referring Physician(s): Code stroke- Bhagat, [place] (neurology)  Supervising Physician: Corrie Mckusick  Patient Status:  [facility] - In-pt  Chief Complaint: Right groin tenderness  Subjective:  History of acute CVA s/p cerebral arteriogram with emergent mechanical thrombectomy of right PCA occlusion achieving a TICI 3 revascularization via right femoral approach 11/14/2019 by Dr. Earleen Newport. Patient awake and alert sitting in bed. Son at bedside. Per notes patient had palpable firmness of right groin last evening- this was marked by RN last evening. She follows simple commands. Can spontaneously move all extremities.   Allergies: Bee venom, Mixed vespid venom, Codeine, Sulfa antibiotics, and Erythromycin  Medications: Prior to Admission medications   Medication Sig Start Date End Date Taking? Authorizing Provider  amoxicillin-clavulanate (AUGMENTIN) 875-125 MG tablet Take 1 tablet by mouth 2 (two) times daily. 11/12/19  Yes [provider]  aspirin 81 MG tablet Take 1 tablet (81 mg total) by mouth daily. 01/08/13  Yes Carlena Bjornstad, MD  atorvastatin (LIPITOR) 10 MG tablet Take 10 mg by mouth daily.   Yes [provider]  BIOTIN PO Take 1 tablet by mouth daily.    Yes [provider]  CALCIUM PO Take 1 tablet by mouth daily.    Yes [provider]  Cholecalciferol (VITAMIN D3) 50 MCG (2000 UT) CAPS Take 2,000 Units by mouth daily.   Yes [provider]  Coenzyme Q10 (CO Q-10 PO) Take 1 tablet by mouth daily.    Yes [provider]  metoprolol succinate (TOPROL-XL) 50 MG 24 hr tablet Take 50 mg by mouth daily. Take with or immediately following a meal.   Yes [provider]  EPINEPHrine 0.3 mg/0.3 mL IJ SOAJ injection Inject 0.3 mg into the muscle as needed for anaphylaxis.  08/04/18   [provider]  Investigational - Study Medication Additional Study Details    [provider]  trimethoprim (TRIMPEX)  100 MG tablet Take 100 mg by mouth as directed. Patient not taking: Reported on 11/14/2019    [provider]     Vital Signs: BP (!) 144/79   Pulse 86   Temp 98.3 F (36.8 C) (Axillary)   Resp (!) 24   Ht _0  (1.549 m)   Wt 146 lb 6.2 oz (66.4 kg)   LMP  (LMP Unknown)   SpO2 93%   BMI 27.66 kg/m   Physical Exam Vitals and nursing note reviewed.  Constitutional:      General: She is not in acute distress. Pulmonary:     Effort: Pulmonary effort is normal. No respiratory distress.  Skin:    General: Skin is warm and dry.     Comments: Right groin marked with site of palpable firmness last evening- this site is currently soft; right femoral puncture site soft but extremely tender to palpation; no active bleeding noted.  Neurological:     Mental Status: She is alert.     Comments: Alert, awake, and oriented x3. Speech and comprehension intact. PERRL bilaterally. Can spontaneously move all extremities. Distal pulses (DPs) 2+ bilaterally.     Imaging: CT Code Stroke CTA Head W/WO contrast  Result Date: 11/13/2019 CLINICAL DATA:  86 year old female code stroke presentation. Presentation suspicious for LV0, receiving IV tPA. History also of recent outpatient treatment for pneumonia. EXAM: CT ANGIOGRAPHY HEAD AND NECK TECHNIQUE: Multidetector CT imaging of the head and neck was performed using the standard protocol during bolus administration of intravenous contrast. Multiplanar CT image reconstructions and MIPs were obtained  to evaluate the vascular anatomy. Carotid stenosis measurements (when applicable) are obtained utilizing NASCET criteria, using the distal internal carotid diameter as the denominator. CONTRAST:  60 mL Omnipaque 350. COMPARISON:  Plain head CT 2253 hours today. FINDINGS: CTA NECK Skeleton: Widespread advanced cervical spine degeneration. No acute osseous abnormality identified. Upper chest: Small to moderate layering left pleural effusion. Moderate to  severe emphysema in the visible upper lungs. Patchy additional peripheral left upper lobe opacity. No superior mediastinal lymphadenopathy. Other neck: No acute findings. Aortic arch: Mild to moderate arch atherosclerosis. Three vessel arch configuration. Right carotid system: Brachiocephalic artery and right CCA origin within normal limits. Mild plaque proximal to the bifurcation without stenosis. Mild soft plaque at the medial right ICA origin without stenosis. Partially retropharyngeal course of the right ICA without stenosis to the skull base. Left carotid system: Mild plaque at the left CCA origin and proximal to the bifurcation without stenosis. Negative bifurcation. Tortuous left ICA with a partially retropharyngeal course, no stenosis. Vertebral arteries: Proximal right subclavian artery plaque without stenosis. Normal right vertebral artery origin. Right vertebral artery is patent and within normal limits to the skull base. Mild to moderate proximal left subclavian artery plaque without significant stenosis. Normal left vertebral artery origin. Tortuous left V1 segment. Left vertebral artery is fairly codominant and patent to the skull base without stenosis. CTA HEAD Posterior circulation: Codominant distal vertebral arteries are patent to the vertebrobasilar junction without plaque or stenosis. The a ICAs may be dominant. Patent basilar artery without stenosis. Patent SCA and left PCA origins. However, the right PCA is occluded just beyond its origin (series 11, image 26) with poor reconstitution distally. The left PCA P2 and P3 segments are mildly irregular with mild stenosis. Anterior circulation: Both ICA siphons are patent. Mild calcified plaque on the left. Mild left supraclinoid ICA stenosis. Similar mild calcified plaque on the right and mild supraclinoid stenosis. Patent carotid termini. Posterior communicating arteries are diminutive or absent. Patent MCA and ACA origins. The left ACA is dominant  and the right is diminutive with subsequent azygos type ACA anatomy. No definite ACA occlusion. Left MCA M1 segment and trifurcation are patent without stenosis. Right MCA M1 segment and bifurcation are patent without stenosis. Bilateral MCA branches appear patent with mild irregularity. Venous sinuses: Early contrast timing, not evaluated. Anatomic variants: Dominant left ACA with azygos type ACA anatomy. Review of the MIP images confirms the above findings IMPRESSION: 1. Positive for occlusion of the Right PCA origin. 2. No other large vessel occlusion, and generally mild for age atherosclerosis in the head and neck. 3. Emphysema (ICD10-J43.9) with layering left pleural effusion, patchy left upper lobe opacity compatible with pneumonia in this setting. 4. Aortic Atherosclerosis (ICD10-I70.0) Preliminary report of the above discussed by telephone with Dr. Lesleigh Noe on 11/13/2019 at 2314 hours. Electronically Signed   By: Genevie Ann M.D.   On: 11/13/2019 23:25   CT HEAD WO CONTRAST  Result Date: 11/14/2019 CLINICAL DATA:  86 year old female approaching 24 hours status post IV tPA and NIR revascularization of the right PCA. EXAM: CT HEAD WITHOUT CONTRAST TECHNIQUE: Contiguous axial images were obtained from the base of the skull through the vertex without intravenous contrast. COMPARISON:  Post treatment head CT 0210 hours today, brain MRI and intracranial MRA 0716 hours today. FINDINGS: Brain: Stable to slightly regressed trace subarachnoid blood in the interpeduncular cistern (series 3, image 13). No intraventricular hemorrhage or other extra-axial blood identified. Resolved contrast staining or less likely evolved intra-axial  blood products associated with the patchy right thalamic infarct (series 3, image 16 now). No significant mass effect. Gray-white matter differentiation elsewhere remains stable. No new areas of cytotoxic edema. Stable ventricle size and configuration. No intracranial mass effect.  Vascular: Calcified atherosclerosis at the skull base. Skull: No acute osseous abnormality identified. Sinuses/Orbits: Improved sinus aeration. Mild to moderate residual mucosal thickening most pronounced in the right sphenoid. Bilateral mastoid effusions persist. Other: Negative orbit and scalp soft tissues. IMPRESSION: 1. Stable patchy right thalamic infarct with resolved contrast staining. No significant mass effect. No other acute or evolving ischemia by CT. 2. Stable to slightly regressed trace subarachnoid blood in the interpeduncular cistern. No other intracranial hemorrhage. Electronically Signed   By: Genevie Ann M.D.   On: 11/14/2019 21:43   CT HEAD WO CONTRAST  Result Date: 11/14/2019 CLINICAL DATA:  86 year old female code stroke presentation with right PCA occlusion. Status post Neuro Interventional endovascular revascularization. EXAM: CT HEAD WITHOUT CONTRAST TECHNIQUE: Contiguous axial images were obtained from the base of the skull through the vertex without intravenous contrast. COMPARISON:  Head CT 2253 hours 11/13/2019. FINDINGS: Brain: There is a small subarachnoid collection of blood and contrast in the interpeduncular cistern (series 3, image 13). The other basilar cisterns remain normal. No intraventricular blood. Small and indistinct 10 mm area of contrast staining versus petechial hemorrhage in the medial right thalamus (series 3, image 16). Asymmetric right thalamic hypodense heterogeneity elsewhere appears slightly increased from the presentation CT. No cytotoxic edema identified elsewhere in the right PCA territory. Stable gray-white matter differentiation elsewhere. No other abnormal enhancement or intracranial blood identified. Vascular: Residual intravascular contrast. Skull: No acute osseous abnormality identified. Sinuses/Orbits: Small fluid levels and mild mucosal thickening now in the sinuses. Stable mild mastoid effusions. Other: Intubated. Small volume fluid in the pharynx. No  acute orbit or scalp soft tissue findings. IMPRESSION: 1. Status post Right PCA endovascular revascularization with trace subarachnoid blood/contrast in the interpeduncular cistern. 2. Small 10 mm area of contrast staining versus petechial hemorrhage in the medial right thalamus. And question subtle increased right thalamic hypodensity elsewhere, but no other Right PCA territory infarct changes are identified. These results were communicated to Dr. Curly Shores at 2:25 am on 11/14/2019 by text page via the [facility] messaging system. Electronically Signed   By: Genevie Ann M.D.   On: 11/14/2019 02:26   CT Code Stroke CTA Neck W/WO contrast  Result Date: 11/13/2019 CLINICAL DATA:  86 year old female code stroke presentation. Presentation suspicious for LV0, receiving IV tPA. History also of recent outpatient treatment for pneumonia. EXAM: CT ANGIOGRAPHY HEAD AND NECK TECHNIQUE: Multidetector CT imaging of the head and neck was performed using the standard protocol during bolus administration of intravenous contrast. Multiplanar CT image reconstructions and MIPs were obtained to evaluate the vascular anatomy. Carotid stenosis measurements (when applicable) are obtained utilizing NASCET criteria, using the distal internal carotid diameter as the denominator. CONTRAST:  60 mL Omnipaque 350. COMPARISON:  Plain head CT 2253 hours today. FINDINGS: CTA NECK Skeleton: Widespread advanced cervical spine degeneration. No acute osseous abnormality identified. Upper chest: Small to moderate layering left pleural effusion. Moderate to severe emphysema in the visible upper lungs. Patchy additional peripheral left upper lobe opacity. No superior mediastinal lymphadenopathy. Other neck: No acute findings. Aortic arch: Mild to moderate arch atherosclerosis. Three vessel arch configuration. Right carotid system: Brachiocephalic artery and right CCA origin within normal limits. Mild plaque proximal to the bifurcation without stenosis. Mild soft  plaque at the medial right  ICA origin without stenosis. Partially retropharyngeal course of the right ICA without stenosis to the skull base. Left carotid system: Mild plaque at the left CCA origin and proximal to the bifurcation without stenosis. Negative bifurcation. Tortuous left ICA with a partially retropharyngeal course, no stenosis. Vertebral arteries: Proximal right subclavian artery plaque without stenosis. Normal right vertebral artery origin. Right vertebral artery is patent and within normal limits to the skull base. Mild to moderate proximal left subclavian artery plaque without significant stenosis. Normal left vertebral artery origin. Tortuous left V1 segment. Left vertebral artery is fairly codominant and patent to the skull base without stenosis. CTA HEAD Posterior circulation: Codominant distal vertebral arteries are patent to the vertebrobasilar junction without plaque or stenosis. The a ICAs may be dominant. Patent basilar artery without stenosis. Patent SCA and left PCA origins. However, the right PCA is occluded just beyond its origin (series 11, image 26) with poor reconstitution distally. The left PCA P2 and P3 segments are mildly irregular with mild stenosis. Anterior circulation: Both ICA siphons are patent. Mild calcified plaque on the left. Mild left supraclinoid ICA stenosis. Similar mild calcified plaque on the right and mild supraclinoid stenosis. Patent carotid termini. Posterior communicating arteries are diminutive or absent. Patent MCA and ACA origins. The left ACA is dominant and the right is diminutive with subsequent azygos type ACA anatomy. No definite ACA occlusion. Left MCA M1 segment and trifurcation are patent without stenosis. Right MCA M1 segment and bifurcation are patent without stenosis. Bilateral MCA branches appear patent with mild irregularity. Venous sinuses: Early contrast timing, not evaluated. Anatomic variants: Dominant left ACA with azygos type ACA anatomy.  Review of the MIP images confirms the above findings IMPRESSION: 1. Positive for occlusion of the Right PCA origin. 2. No other large vessel occlusion, and generally mild for age atherosclerosis in the head and neck. 3. Emphysema (ICD10-J43.9) with layering left pleural effusion, patchy left upper lobe opacity compatible with pneumonia in this setting. 4. Aortic Atherosclerosis (ICD10-I70.0) Preliminary report of the above discussed by telephone with Dr. Lesleigh Noe on 11/13/2019 at 2314 hours. Electronically Signed   By: Genevie Ann M.D.   On: 11/13/2019 23:25   MR ANGIO HEAD WO CONTRAST  Result Date: 11/14/2019 CLINICAL DATA:  Stroke follow-up. Right posterior cerebral artery thrombectomy 11/14/2019 EXAM: MRI HEAD WITHOUT CONTRAST MRA HEAD WITHOUT CONTRAST TECHNIQUE: Multiplanar, multiecho pulse sequences of the brain and surrounding structures were obtained without intravenous contrast. Angiographic images of the head were obtained using MRA technique without contrast. COMPARISON:  CT head 11/14/2019 FINDINGS: MRI HEAD FINDINGS Brain: Restricted diffusion right thalamus compatible with acute infarct. Additional small areas of acute infarct in the right inferior and right superior cerebellum. Small area of acute infarct in the right frontal cortex over the convexity. Small amount of hemorrhage in the right thalamus and in the subarachnoid space in the interpeduncular cistern as noted on CT. Generalized atrophy. Ventricular enlargement is stable. Periventricular deep white matter hyperintensities bilaterally compatible with chronic ischemia. No mass lesion. Vascular: Normal arterial flow voids. Skull and upper cervical spine: No focal lesion. Sinuses/Orbits: Mucosal edema paranasal sinuses with air-fluid level right maxillary sinus. Bilateral cataract extraction Other: None MRA HEAD FINDINGS Both vertebral arteries patent to the basilar without stenosis. PICA not visualized. AICA patent bilaterally. Basilar  widely patent. Superior cerebellar and posterior cerebral arteries patent bilaterally. Mild stenosis right P1/P2 junction with good distal flow in the right posterior cerebral artery post thrombectomy. Left PCA widely patent. Internal carotid artery  widely patent bilaterally. Anterior and middle cerebral arteries widely patent without stenosis or large vessel occlusion. Negative for aneurysm. IMPRESSION: Acute infarct right thalamus with associated mild hemorrhage as noted on recent CT. There is also mild subarachnoid hemorrhage in the interpeduncular cistern unchanged from CT. Additional small areas of acute infarct in the right cerebellum and right frontal cortex. Moderate atrophy. Chronic microvascular ischemic change in the white matter. Right PCA patent post thrombectomy. There is a mild stenosis in the right PCA at the P1/ P2 junction. Electronically Signed   By: Franchot Gallo M.D.   On: 11/14/2019 08:26   MR BRAIN WO CONTRAST  Result Date: 11/14/2019 CLINICAL DATA:  Stroke follow-up. Right posterior cerebral artery thrombectomy 11/14/2019 EXAM: MRI HEAD WITHOUT CONTRAST MRA HEAD WITHOUT CONTRAST TECHNIQUE: Multiplanar, multiecho pulse sequences of the brain and surrounding structures were obtained without intravenous contrast. Angiographic images of the head were obtained using MRA technique without contrast. COMPARISON:  CT head 11/14/2019 FINDINGS: MRI HEAD FINDINGS Brain: Restricted diffusion right thalamus compatible with acute infarct. Additional small areas of acute infarct in the right inferior and right superior cerebellum. Small area of acute infarct in the right frontal cortex over the convexity. Small amount of hemorrhage in the right thalamus and in the subarachnoid space in the interpeduncular cistern as noted on CT. Generalized atrophy. Ventricular enlargement is stable. Periventricular deep white matter hyperintensities bilaterally compatible with chronic ischemia. No mass lesion.  Vascular: Normal arterial flow voids. Skull and upper cervical spine: No focal lesion. Sinuses/Orbits: Mucosal edema paranasal sinuses with air-fluid level right maxillary sinus. Bilateral cataract extraction Other: None MRA HEAD FINDINGS Both vertebral arteries patent to the basilar without stenosis. PICA not visualized. AICA patent bilaterally. Basilar widely patent. Superior cerebellar and posterior cerebral arteries patent bilaterally. Mild stenosis right P1/P2 junction with good distal flow in the right posterior cerebral artery post thrombectomy. Left PCA widely patent. Internal carotid artery widely patent bilaterally. Anterior and middle cerebral arteries widely patent without stenosis or large vessel occlusion. Negative for aneurysm. IMPRESSION: Acute infarct right thalamus with associated mild hemorrhage as noted on recent CT. There is also mild subarachnoid hemorrhage in the interpeduncular cistern unchanged from CT. Additional small areas of acute infarct in the right cerebellum and right frontal cortex. Moderate atrophy. Chronic microvascular ischemic change in the white matter. Right PCA patent post thrombectomy. There is a mild stenosis in the right PCA at the P1/ P2 junction. Electronically Signed   By: Franchot Gallo M.D.   On: 11/14/2019 08:26   IR CT Head Ltd  Result Date: 11/14/2019 INDICATION: 86 year old female presents for mechanical thrombectomy with emergent large vessel occlusion involving the right posterior cerebral artery EXAM: ULTRASOUND-GUIDED ACCESS RIGHT COMMON FEMORAL ARTERY CERVICAL AND CEREBRAL ANGIOGRAM MECHANICAL THROMBECTOMY RIGHT PCA ANGIO-SEAL FOR HEMOSTASIS COMPARISON:  CT imaging same day MEDICATIONS: None ANESTHESIA/SEDATION: The anesthesia team was present to provide general endotracheal tube anesthesia and for patient monitoring during the procedure. Intubation was performed in negative pressure [place] in neuro IR holding. Left radial arterial line was performed by the  anesthesia team. Interventional neuro radiology nursing staff was also present. CONTRAST:  100 cc contrast FLUOROSCOPY TIME:  Fluoroscopy Time: 26 minutes 18 seconds (1961 mGy). COMPLICATIONS: SIR Level A - No therapy, no consequence. TECHNIQUE: Informed written consent was obtained from the patient's family after a thorough discussion of the procedural risks, benefits and alternatives. Specific risks discussed include: Bleeding, infection, contrast reaction, kidney injury/failure, need for further procedure/surgery, arterial injury or dissection, embolization  to new territory, intracranial hemorrhage (10-15% risk), neurologic deterioration, cardiopulmonary collapse, death. All questions were addressed. Maximal Sterile Barrier Technique was utilized including during the procedure including caps, mask, sterile gowns, sterile gloves, sterile drape, hand hygiene and skin antiseptic. A timeout was performed prior to the initiation of the procedure. The anesthesia team was present to provide general endotracheal tube anesthesia and for patient monitoring during the procedure. Interventional neuro radiology nursing staff was also present. FINDINGS: Initial Findings: Left subclavian artery: Normal course caliber and contour. Left internal mammary artery is patent. [place]o cervical trunk and thyrocervical trunk are patent. Left vertebral artery: No significant atherosclerotic changes at the origin of the left vertebral artery. Tortuous course of the cervical vertebral artery without significant stenosis. Normal course caliber and contour of the intradural vertebral artery with no significant stenosis or atherosclerotic plaque. Basilar artery: Normal course caliber and contour. Patent right posteroinferior cerebellar artery. There appears to be a left-sided PICA/AICA variant Reflux of the distal right vertebral artery at the junction. Bilateral anterior inferior cerebellar arteries are patent. Bilateral superior cerebellar  arteries are patent. Right PCA: Occluded just after the origin Left PCA: Normal course caliber and contour. Completion Findings: Right PCA: Complete restoration of flow after first pass, TICI 3. Baseline PCA flow: TICI 0 Final PCA flow: TICI 3 PROCEDURE: The anesthesia team was present to provide general endotracheal tube anesthesia and for patient monitoring during the procedure. Intubation was performed in negative pressure [place] in neuro IR holding. Interventional neuro radiology nursing staff was also present. Ultrasound survey of the right inguinal region was performed with images stored and sent to PACs. 11 blade scalpel was used to make a small incision. Blunt dissection was performed with US guidance. A micropuncture needle was used access the right common femoral artery under ultrasound. With excellent arterial blood flow returned, an .018 micro wire was passed through the needle, observed to enter the abdominal aorta under fluoroscopy. The needle was removed, and a micropuncture sheath was placed over the wire. The inner dilator and wire were removed, and an 035 wire was advanced under fluoroscopy into the abdominal aorta. The sheath was removed and a 25cm 45F straight vascular sheath was placed. The dilator was removed and the sheath was flushed. Sheath was attached to pressurized and heparinized saline bag for constant forward flow. A coaxial system was then advanced over the 035 wire. This included a 90cm neuron Max with coaxial 125cm Berenstein diagnostic catheter. This was advanced to the proximal descending thoracic aorta. Wire was then removed. Double flush of the catheter was performed. Catheter was then used to select the left subclavian artery. Angiogram was performed. Using roadmap technique, the catheter was advanced over a standard glide wire into left vertebral, with distal position achieved of the diagnostic catheter. The Neuron max catheter was then attempted to advance into the vertebral  artery, meeting resistance just after the origin at the first tortuous turn. The Neuron Max was withdrawn just to the origin, and the diagnostic catheter and the glide wire were removed. Formal angiogram was performed. Road map function was used once the occluded vessel was identified. Copious back flush was performed and the balloon catheter was attached to heparinized and pressurized saline bag for forward flow. A second coaxial system was then advanced through the balloon catheter, which included the selected intermediate catheter, microcatheter, and microwire. In this scenario, the set up included a 135cm CAT 5 catheter, a 160cm 021 lumen microcatheter, and 014 synchro soft wire. This system  was advanced through the NeuronMax catheter under the road-map function, with adequate back-flush at the rotating hemostatic valve at that back end of the balloon guide. Upon the initial navigation of this second coaxial system into the proximal vertebral artery, the resistance of the system prolapsed the Neuron max into the aortic arch. This was not recoverable. Microcatheter intermediate catheter were removed and the 125 cm Berenstein catheter was advanced to the neuron max. We then selected the vertebral artery again with a proximal position of the neuron max. Berenstein catheter and the wire were removed. Angiogram was repeated. A second attempt was used then to navigate the CT 5 intermediate catheter and the microcatheter into the vertebral artery, which again prolapsed the base catheter. With the inability to get a more distal position given the tortuosity of the proximal vertebral artery segment, we withdrew the neuron max and abandoned this for a 6 French 95 cm benchmark catheter. The benchmark catheter was navigated with the mated diagnostic catheter into the left subclavian artery and the left vertebral artery. We were able to successfully navigate the combination into the mid segment of the cervical vertebral  artery with the benchmark catheter achieving a more distal position. The Glidewire and the diagnostic catheter were withdrawn. Angiogram was performed. Microcatheter was then advanced into the basilar artery, and to the right p1 origin to the level of the occlusion. The synchro soft wire met resistance upon the initial passage through the occlusion, and so was withdrawn. We then used a 016 J shape headliner wire to navigate through the occlusion with the microcatheter following into the P3 segment of the PCA. Wire was gently removed with saline drip at the hub. Blood was then aspirated through the hub of the microcatheter, and a gentle contrast injection was performed confirming intraluminal position. A rotating hemostatic valve was then attached to the back end of the microcatheter, and a pressurized and heparinized saline bag was attached to the catheter. 3 x 20 solitaire device was then selected. Back flush was achieved at the rotating hemostatic valve, and then the device was gently advanced through the microcatheter to the distal end. The retriever was then unsheathed by withdrawing the microcatheter under fluoroscopy. Control angiogram was performed from the intermediate catheter. A 3 minute time interval was observed. We then withdrew the microcatheter and the solitaire under fluoroscopic observation, wall aspirating at the hub of the benchmark catheter under hand aspiration with 2 x 60 cc syringes on a 3 way stopcock. Once the solitaire was withdrawn to the Touhy the 2 was removed for the back end of the benchmark allowing back flow through the benchmark catheter. We aspirated at the hub and discarded the aspirate. Repeat angiogram was then performed. Restoration of flow was confirmed. Angiogram of the cervical vertebral artery was performed. The skin at the puncture site was then cleaned with Chlorhexidine. The 8 French sheath was removed and an 53F angioseal was deployed. Flat panel CT was performed. Given  the findings of a questionable interpeduncular hemorrhage, the patient remained intubated. Patient tolerated the procedure well and remained hemodynamically stable throughout. Blood loss was estimated 50 cc IMPRESSION: Status post ultrasound guided access right common femoral artery for cervical and cerebral angiogram and mechanical thrombectomy of right posterior cerebral artery for treatment of posterior circulation ELVO, with TICI 3 flow at the conclusion. Signed, Dulcy Fanny. Dellia Nims, RPVI [facility] Radiology PLAN: The patient will remain intubated, as small volume subarachnoid hemorrhage was confirmed with formal  CT ICU status Target systolic blood pressure of 120-140 Right hip straight time 6 hours Frequent neurovascular checks Repeat neurologic imaging with CT and/MRI at the discretion of neurology team Electronically Signed   By: Corrie Mckusick D.O.   On: 11/14/2019 02:58   IR US Guide Vasc Access Right  Result Date: 11/14/2019 INDICATION: 86 year old female presents for mechanical thrombectomy with emergent large vessel occlusion involving the right posterior cerebral artery EXAM: ULTRASOUND-GUIDED ACCESS RIGHT COMMON FEMORAL ARTERY CERVICAL AND CEREBRAL ANGIOGRAM MECHANICAL THROMBECTOMY RIGHT PCA ANGIO-SEAL FOR HEMOSTASIS COMPARISON:  CT imaging same day MEDICATIONS: None ANESTHESIA/SEDATION: The anesthesia team was present to provide general endotracheal tube anesthesia and for patient monitoring during the procedure. Intubation was performed in negative pressure Bay in neuro IR holding. Left radial arterial line was performed by the anesthesia team. Interventional neuro radiology nursing staff was also present. CONTRAST:  100 cc contrast FLUOROSCOPY TIME:  Fluoroscopy Time: 26 minutes 18 seconds (1961 mGy). COMPLICATIONS: SIR Level A - No therapy, no consequence. TECHNIQUE: Informed written consent was obtained from the patient's family after a  thorough discussion of the procedural risks, benefits and alternatives. Specific risks discussed include: Bleeding, infection, contrast reaction, kidney injury/failure, need for further procedure/surgery, arterial injury or dissection, embolization to new territory, intracranial hemorrhage (10-15% risk), neurologic deterioration, cardiopulmonary collapse, death. All questions were addressed. Maximal Sterile Barrier Technique was utilized including during the procedure including caps, mask, sterile gowns, sterile gloves, sterile drape, hand hygiene and skin antiseptic. A timeout was performed prior to the initiation of the procedure. The anesthesia team was present to provide general endotracheal tube anesthesia and for patient monitoring during the procedure. Interventional neuro radiology nursing staff was also present. FINDINGS: Initial Findings: Left subclavian artery: Normal course caliber and contour. Left internal mammary artery is patent. Costo cervical trunk and thyrocervical trunk are patent. Left vertebral artery: No significant atherosclerotic changes at the origin of the left vertebral artery. Tortuous course of the cervical vertebral artery without significant stenosis. Normal course caliber and contour of the intradural vertebral artery with no significant stenosis or atherosclerotic plaque. Basilar artery: Normal course caliber and contour. Patent right posteroinferior cerebellar artery. There appears to be a left-sided PICA/AICA variant Reflux of the distal right vertebral artery at the junction. Bilateral anterior inferior cerebellar arteries are patent. Bilateral superior cerebellar arteries are patent. Right PCA: Occluded just after the origin Left PCA: Normal course caliber and contour. Completion Findings: Right PCA: Complete restoration of flow after first pass, TICI 3. Baseline PCA flow: TICI 0 Final PCA flow: TICI 3 PROCEDURE: The anesthesia team was present to provide general endotracheal  tube anesthesia and for patient monitoring during the procedure. Intubation was performed in negative pressure Bay in neuro IR holding. Interventional neuro radiology nursing staff was also present. Ultrasound survey of the right inguinal region was performed with images stored and sent to PACs. 11 blade scalpel was used to make a small incision. Blunt dissection was performed with US guidance. A micropuncture needle was used access the right common femoral artery under ultrasound. With excellent arterial blood flow returned, an .018 micro wire was passed through the needle, observed to enter the abdominal aorta under fluoroscopy. The needle was removed, and a micropuncture sheath was placed over the wire. The inner dilator and wire were removed, and an 035 wire was advanced under fluoroscopy into the abdominal aorta. The sheath was removed and a 25cm 39F straight vascular sheath was placed. The dilator was removed and the sheath was flushed.  Sheath was attached to pressurized and heparinized saline bag for constant forward flow. A coaxial system was then advanced over the 035 wire. This included a 90cm neuron Max with coaxial 125cm Berenstein diagnostic catheter. This was advanced to the proximal descending thoracic aorta. Wire was then removed. Double flush of the catheter was performed. Catheter was then used to select the left subclavian artery. Angiogram was performed. Using roadmap technique, the catheter was advanced over a standard glide wire into left vertebral, with distal position achieved of the diagnostic catheter. The Neuron max catheter was then attempted to advance into the vertebral artery, meeting resistance just after the origin at the first tortuous turn. The Neuron Max was withdrawn just to the origin, and the diagnostic catheter and the glide wire were removed. Formal angiogram was performed. Road map function was used once the occluded vessel was identified. Copious back flush was performed and  the balloon catheter was attached to heparinized and pressurized saline bag for forward flow. A second coaxial system was then advanced through the balloon catheter, which included the selected intermediate catheter, microcatheter, and microwire. In this scenario, the set up included a 135cm CAT 5 catheter, a 160cm 021 lumen microcatheter, and 014 synchro soft wire. This system was advanced through the NeuronMax catheter under the road-map function, with adequate back-flush at the rotating hemostatic valve at that back end of the balloon guide. Upon the initial navigation of this second coaxial system into the proximal vertebral artery, the resistance of the system prolapsed the Neuron max into the aortic arch. This was not recoverable. Microcatheter intermediate catheter were removed and the 125 cm Berenstein catheter was advanced to the neuron max. We then selected the vertebral artery again with a proximal position of the neuron max. Berenstein catheter and the wire were removed. Angiogram was repeated. A second attempt was used then to navigate the CT 5 intermediate catheter and the microcatheter into the vertebral artery, which again prolapsed the base catheter. With the inability to get a more distal position given the tortuosity of the proximal vertebral artery segment, we withdrew the neuron max and abandoned this for a 6 French 95 cm benchmark catheter. The benchmark catheter was navigated with the mated diagnostic catheter into the left subclavian artery and the left vertebral artery. We were able to successfully navigate the combination into the mid segment of the cervical vertebral artery with the benchmark catheter achieving a more distal position. The Glidewire and the diagnostic catheter were withdrawn. Angiogram was performed. Microcatheter was then advanced into the basilar artery, and to the right p1 origin to the level of the occlusion. The synchro soft wire met resistance upon the initial passage  through the occlusion, and so was withdrawn. We then used a 016 J shape headliner wire to navigate through the occlusion with the microcatheter following into the P3 segment of the PCA. Wire was gently removed with saline drip at the hub. Blood was then aspirated through the hub of the microcatheter, and a gentle contrast injection was performed confirming intraluminal position. A rotating hemostatic valve was then attached to the back end of the microcatheter, and a pressurized and heparinized saline bag was attached to the catheter. 3 x 20 solitaire device was then selected. Back flush was achieved at the rotating hemostatic valve, and then the device was gently advanced through the microcatheter to the distal end. The retriever was then unsheathed by withdrawing the microcatheter under fluoroscopy. Control angiogram was performed from the intermediate catheter. A  3 minute time interval was observed. We then withdrew the microcatheter and the solitaire under fluoroscopic observation, wall aspirating at the hub of the benchmark catheter under hand aspiration with 2 x 60 cc syringes on a 3 way stopcock. Once the solitaire was withdrawn to the Touhy the 2 was removed for the back end of the benchmark allowing back flow through the benchmark catheter. We aspirated at the hub and discarded the aspirate. Repeat angiogram was then performed. Restoration of flow was confirmed. Angiogram of the cervical vertebral artery was performed. The skin at the puncture site was then cleaned with Chlorhexidine. The 8 French sheath was removed and an 108F angioseal was deployed. Flat panel CT was performed. Given the findings of a questionable interpeduncular hemorrhage, the patient remained intubated. Patient tolerated the procedure well and remained hemodynamically stable throughout. Blood loss was estimated 50 cc IMPRESSION: Status post ultrasound guided access right common femoral artery for cervical and cerebral angiogram and  mechanical thrombectomy of right posterior cerebral artery for treatment of posterior circulation ELVO, with TICI 3 flow at the conclusion. Signed, Dulcy Fanny. Dellia Nims, RPVI [facility] Radiology PLAN: The patient will remain intubated, as small volume subarachnoid hemorrhage was confirmed with formal CT ICU status Target systolic blood pressure of 120-140 Right hip straight time 6 hours Frequent neurovascular checks Repeat neurologic imaging with CT and/MRI at the discretion of neurology team Electronically Signed   By: Corrie Mckusick D.O.   On: 11/14/2019 02:58   DG CHEST PORT 1 VIEW  Result Date: 11/14/2019 CLINICAL DATA:  Endotracheal tube placement EXAM: PORTABLE CHEST 1 VIEW COMPARISON:  10/08/2010 FINDINGS: The endotracheal tube is been placed with tip measuring 4.7 cm above the carina. Cardiac enlargement. Small bilateral pleural effusions. Peripheral interstitial changes with evidence of bronchiectasis, likely chronic fibrosis. Edema or pneumonia would be less likely considerations. No pneumothorax. Mediastinal contours appear intact. Calcification of the aorta. IMPRESSION: Endotracheal tube placed with tip measuring 4.7 cm above the carina. Cardiac enlargement with small bilateral pleural effusions. Probable chronic interstitial changes. Electronically Signed   By: Lucienne Capers M.D.   On: 11/14/2019 03:59   ECHOCARDIOGRAM COMPLETE  Result Date: 11/14/2019    ECHOCARDIOGRAM REPORT   Patient Name:   ZAYDEN HAHNE Date of Exam: 11/14/2019 Medical Rec #:  [id number]     Height:       61.0 in Accession #:    [id number]    Weight:       146.4 lb Date of Birth:  Jun 01, 1931     BSA:          1.654 m Patient Age:    66 years      BP:           163/73 mmHg Patient Gender: F             HR:           78 bpm. Exam Location:  Inpatient Procedure: 2D Echo, Cardiac Doppler, Color Doppler and Intracardiac            Opacification Agent Indications:    Stroke   History:        Patient has prior history of Echocardiogram examinations, most                 recent 05/31/2012. Arrythmias:Atrial Flutter, Atrial Fibrillation                 and SVT, Signs/Symptoms:Chest Pain; Risk Factors:Former Smoker.  Sonographer:    Clayton Lefort RDCS (AE) Referring Phys: [id number] Lorenza Chick  Sonographer Comments: Suboptimal apical window. IMPRESSIONS  1. Left ventricular ejection fraction, by estimation, is 60 to 65%. The left ventricle has normal function. The left ventricle has no regional wall motion abnormalities. There is severe asymmetric left ventricular hypertrophy of the basal-septal segment. Left ventricular diastolic function could not be evaluated.  2. Right ventricular systolic function is normal. The right ventricular size is normal. There is mildly elevated pulmonary artery systolic pressure.  3. Left atrial size was moderately dilated.  4. A small pericardial effusion is present. The pericardial effusion is circumferential.  5. The mitral valve is normal in structure. Trivial mitral valve regurgitation. No evidence of mitral stenosis. Moderate mitral annular calcification.  6. The aortic valve is tricuspid. There is mild calcification of the aortic valve. There is mild thickening of the aortic valve. Aortic valve regurgitation is trivial. Mild aortic valve sclerosis is present, with no evidence of aortic valve stenosis.  7. The inferior vena cava is normal in size with greater than 50% respiratory variability, suggesting right atrial pressure of 3 mmHg. Conclusion(s)/Recommendation(s): No intracardiac source of embolism detected on this transthoracic study. A transesophageal echocardiogram is recommended to exclude cardiac source of embolism if clinically indicated. Basal septal hypertrophy of LV that is severe, otherwise diffuse moderate LVH. No focal wall motion abnormalities. FINDINGS  Left Ventricle: Left ventricular ejection fraction, by estimation, is 60 to 65%. The  left ventricle has normal function. The left ventricle has no regional wall motion abnormalities. Definity contrast agent was given IV to delineate the left ventricular  endocardial borders. The left ventricular internal cavity size was normal in size. There is severe asymmetric left ventricular hypertrophy of the basal-septal segment. Left ventricular diastolic function could not be evaluated due to atrial fibrillation. Left ventricular diastolic function could not be evaluated. Right Ventricle: The right ventricular size is normal. Right vetricular wall thickness was not well visualized. Right ventricular systolic function is normal. There is mildly elevated pulmonary artery systolic pressure. The tricuspid regurgitant velocity  is 2.93 m/s, and with an assumed right atrial pressure of 3 mmHg, the estimated right ventricular systolic pressure is 27.0 mmHg. Left Atrium: Left atrial size was moderately dilated. Right Atrium: Right atrial size was normal in size. Pericardium: A small pericardial effusion is present. The pericardial effusion is circumferential. Mitral Valve: The mitral valve is normal in structure. There is mild thickening of the mitral valve leaflet(s). There is mild calcification of the mitral valve leaflet(s). Moderate mitral annular calcification. Trivial mitral valve regurgitation. No evidence of mitral valve stenosis. Tricuspid Valve: The tricuspid valve is normal in structure. Tricuspid valve regurgitation is trivial. No evidence of tricuspid stenosis. Aortic Valve: The aortic valve is tricuspid. There is mild calcification of the aortic valve. There is mild thickening of the aortic valve. Aortic valve regurgitation is trivial. Mild aortic valve sclerosis is present, with no evidence of aortic valve stenosis. Aortic valve mean gradient measures 3.0 mmHg. Aortic valve peak gradient measures 6.2 mmHg. Aortic valve area, by VTI measures 2.69 cm. Pulmonic Valve: The pulmonic valve was grossly  normal. Pulmonic valve regurgitation is not visualized. Aorta: The aortic root and ascending aorta are structurally normal, with no evidence of dilitation. Venous: The inferior vena cava is normal in size with greater than 50% respiratory variability, suggesting right atrial pressure of 3 mmHg. IAS/Shunts: The atrial septum is grossly normal. Additional Comments: There is a small pleural effusion in  both left and right lateral regions.  LEFT VENTRICLE PLAX 2D LVIDd:         3.10 cm LVIDs:         2.30 cm LV PW:         1.40 cm LV IVS:        1.90 cm LVOT diam:     2.00 cm LV SV:         59 LV SV Index:   36 LVOT Area:     3.14 cm  RIGHT VENTRICLE            IVC RV Basal diam:  2.50 cm    IVC diam: 1.90 cm RV S prime:     7.94 cm/s TAPSE (M-mode): 2.0 cm LEFT ATRIUM             Index       RIGHT ATRIUM           Index LA diam:        3.90 cm 2.36 cm/m  RA Area:     13.80 cm LA Vol (A2C):   94.6 ml 57.19 ml/m RA Volume:   26.70 ml  16.14 ml/m LA Vol (A4C):   54.4 ml 32.89 ml/m LA Biplane Vol: 72.7 ml 43.95 ml/m  AORTIC VALVE AV Area (Vmax):    2.60 cm AV Area (Vmean):   2.61 cm AV Area (VTI):     2.69 cm AV Vmax:           124.00 cm/s AV Vmean:          80.675 cm/s AV VTI:            0.220 m AV Peak Grad:      6.2 mmHg AV Mean Grad:      3.0 mmHg LVOT Vmax:         102.57 cm/s LVOT Vmean:        67.025 cm/s LVOT VTI:          0.189 m LVOT/AV VTI ratio: 0.86  AORTA Ao Root diam: 3.20 cm Ao Asc diam:  3.00 cm TRICUSPID VALVE TR Peak grad:   34.3 mmHg TR Vmax:        293.00 cm/s  SHUNTS Systemic VTI:  0.19 m Systemic Diam: 2.00 cm Buford Dresser MD Electronically signed by Buford Dresser MD Signature Date/Time: 11/14/2019/9:17:16 PM    Final    IR PERCUTANEOUS ART THROMBECTOMY/INFUSION INTRACRANIAL INC DIAG ANGIO  Result Date: 11/14/2019 INDICATION: 86 year old female presents for mechanical thrombectomy with emergent large vessel occlusion involving the right posterior cerebral artery EXAM:  ULTRASOUND-GUIDED ACCESS RIGHT COMMON FEMORAL ARTERY CERVICAL AND CEREBRAL ANGIOGRAM MECHANICAL THROMBECTOMY RIGHT PCA ANGIO-SEAL FOR HEMOSTASIS COMPARISON:  CT imaging same day MEDICATIONS: None ANESTHESIA/SEDATION: The anesthesia team was present to provide general endotracheal tube anesthesia and for patient monitoring during the procedure. Intubation was performed in negative pressure Bay in neuro IR holding. Left radial arterial line was performed by the anesthesia team. Interventional neuro radiology nursing staff was also present. CONTRAST:  100 cc contrast FLUOROSCOPY TIME:  Fluoroscopy Time: 26 minutes 18 seconds (1961 mGy). COMPLICATIONS: SIR Level A - No therapy, no consequence. TECHNIQUE: Informed written consent was obtained from the patient's family after a thorough discussion of the procedural risks, benefits and alternatives. Specific risks discussed include: Bleeding, infection, contrast reaction, kidney injury/failure, need for further procedure/surgery, arterial injury or dissection, embolization to new territory, intracranial hemorrhage (10-15% risk), neurologic deterioration, cardiopulmonary collapse, death. All questions were addressed. Maximal  Sterile Barrier Technique was utilized including during the procedure including caps, mask, sterile gowns, sterile gloves, sterile drape, hand hygiene and skin antiseptic. A timeout was performed prior to the initiation of the procedure. The anesthesia team was present to provide general endotracheal tube anesthesia and for patient monitoring during the procedure. Interventional neuro radiology nursing staff was also present. FINDINGS: Initial Findings: Left subclavian artery: Normal course caliber and contour. Left internal mammary artery is patent. Costo cervical trunk and thyrocervical trunk are patent. Left vertebral artery: No significant atherosclerotic changes at the origin of the left vertebral artery. Tortuous course of the cervical vertebral  artery without significant stenosis. Normal course caliber and contour of the intradural vertebral artery with no significant stenosis or atherosclerotic plaque. Basilar artery: Normal course caliber and contour. Patent right posteroinferior cerebellar artery. There appears to be a left-sided PICA/AICA variant Reflux of the distal right vertebral artery at the junction. Bilateral anterior inferior cerebellar arteries are patent. Bilateral superior cerebellar arteries are patent. Right PCA: Occluded just after the origin Left PCA: Normal course caliber and contour. Completion Findings: Right PCA: Complete restoration of flow after first pass, TICI 3. Baseline PCA flow: TICI 0 Final PCA flow: TICI 3 PROCEDURE: The anesthesia team was present to provide general endotracheal tube anesthesia and for patient monitoring during the procedure. Intubation was performed in negative pressure Bay in neuro IR holding. Interventional neuro radiology nursing staff was also present. Ultrasound survey of the right inguinal region was performed with images stored and sent to PACs. 11 blade scalpel was used to make a small incision. Blunt dissection was performed with US guidance. A micropuncture needle was used access the right common femoral artery under ultrasound. With excellent arterial blood flow returned, an .018 micro wire was passed through the needle, observed to enter the abdominal aorta under fluoroscopy. The needle was removed, and a micropuncture sheath was placed over the wire. The inner dilator and wire were removed, and an 035 wire was advanced under fluoroscopy into the abdominal aorta. The sheath was removed and a 25cm 34F straight vascular sheath was placed. The dilator was removed and the sheath was flushed. Sheath was attached to pressurized and heparinized saline bag for constant forward flow. A coaxial system was then advanced over the 035 wire. This included a 90cm neuron Max with coaxial 125cm Berenstein  diagnostic catheter. This was advanced to the proximal descending thoracic aorta. Wire was then removed. Double flush of the catheter was performed. Catheter was then used to select the left subclavian artery. Angiogram was performed. Using roadmap technique, the catheter was advanced over a standard glide wire into left vertebral, with distal position achieved of the diagnostic catheter. The Neuron max catheter was then attempted to advance into the vertebral artery, meeting resistance just after the origin at the first tortuous turn. The Neuron Max was withdrawn just to the origin, and the diagnostic catheter and the glide wire were removed. Formal angiogram was performed. Road map function was used once the occluded vessel was identified. Copious back flush was performed and the balloon catheter was attached to heparinized and pressurized saline bag for forward flow. A second coaxial system was then advanced through the balloon catheter, which included the selected intermediate catheter, microcatheter, and microwire. In this scenario, the set up included a 135cm CAT 5 catheter, a 160cm 021 lumen microcatheter, and 014 synchro soft wire. This system was advanced through the NeuronMax catheter under the road-map function, with adequate back-flush at the rotating hemostatic  valve at that back end of the balloon guide. Upon the initial navigation of this second coaxial system into the proximal vertebral artery, the resistance of the system prolapsed the Neuron max into the aortic arch. This was not recoverable. Microcatheter intermediate catheter were removed and the 125 cm Berenstein catheter was advanced to the neuron max. We then selected the vertebral artery again with a proximal position of the neuron max. Berenstein catheter and the wire were removed. Angiogram was repeated. A second attempt was used then to navigate the CT 5 intermediate catheter and the microcatheter into the vertebral artery, which again  prolapsed the base catheter. With the inability to get a more distal position given the tortuosity of the proximal vertebral artery segment, we withdrew the neuron max and abandoned this for a 6 French 95 cm benchmark catheter. The benchmark catheter was navigated with the mated diagnostic catheter into the left subclavian artery and the left vertebral artery. We were able to successfully navigate the combination into the mid segment of the cervical vertebral artery with the benchmark catheter achieving a more distal position. The Glidewire and the diagnostic catheter were withdrawn. Angiogram was performed. Microcatheter was then advanced into the basilar artery, and to the right p1 origin to the level of the occlusion. The synchro soft wire met resistance upon the initial passage through the occlusion, and so was withdrawn. We then used a 016 J shape headliner wire to navigate through the occlusion with the microcatheter following into the P3 segment of the PCA. Wire was gently removed with saline drip at the hub. Blood was then aspirated through the hub of the microcatheter, and a gentle contrast injection was performed confirming intraluminal position. A rotating hemostatic valve was then attached to the back end of the microcatheter, and a pressurized and heparinized saline bag was attached to the catheter. 3 x 20 solitaire device was then selected. Back flush was achieved at the rotating hemostatic valve, and then the device was gently advanced through the microcatheter to the distal end. The retriever was then unsheathed by withdrawing the microcatheter under fluoroscopy. Control angiogram was performed from the intermediate catheter. A 3 minute time interval was observed. We then withdrew the microcatheter and the solitaire under fluoroscopic observation, wall aspirating at the hub of the benchmark catheter under hand aspiration with 2 x 60 cc syringes on a 3 way stopcock. Once the solitaire was withdrawn to  the Touhy the 2 was removed for the back end of the benchmark allowing back flow through the benchmark catheter. We aspirated at the hub and discarded the aspirate. Repeat angiogram was then performed. Restoration of flow was confirmed. Angiogram of the cervical vertebral artery was performed. The skin at the puncture site was then cleaned with Chlorhexidine. The 8 French sheath was removed and an 20F angioseal was deployed. Flat panel CT was performed. Given the findings of a questionable interpeduncular hemorrhage, the patient remained intubated. Patient tolerated the procedure well and remained hemodynamically stable throughout. Blood loss was estimated 50 cc IMPRESSION: Status post ultrasound guided access right common femoral artery for cervical and cerebral angiogram and mechanical thrombectomy of right posterior cerebral artery for treatment of posterior circulation ELVO, with TICI 3 flow at the conclusion. Signed, Dulcy Fanny. Dellia Nims, RPVI [facility] Radiology PLAN: The patient will remain intubated, as small volume subarachnoid hemorrhage was confirmed with formal CT ICU status Target systolic blood pressure of 120-140 Right hip straight time 6 hours Frequent neurovascular  checks Repeat neurologic imaging with CT and/MRI at the discretion of neurology team Electronically Signed   By: Corrie Mckusick D.O.   On: 11/14/2019 02:58   CT HEAD CODE STROKE WO CONTRAST  Result Date: 11/13/2019 CLINICAL DATA:  Code stroke. 86 year old female with rightward gaze and left side weakness. EXAM: CT HEAD WITHOUT CONTRAST TECHNIQUE: Contiguous axial images were obtained from the base of the skull through the vertex without intravenous contrast. COMPARISON:  Head CT 02/04/2005. FINDINGS: Brain: No acute intracranial hemorrhage identified. No midline shift, mass effect, or evidence of intracranial mass lesion. Chronic cerebral volume loss. Ex vacuo appearing ventricular  enlargement. Patchy bilateral cerebral white matter hypodensity. Age indeterminate patchy hypodensity in the thalami. Basal ganglia appear relatively spared. No cortically based acute infarct identified. No cortical encephalomalacia identified. Brainstem and cerebellum are within normal limits. Vascular: Calcified atherosclerosis at the skull base. No suspicious intracranial vascular hyperdensity. Skull: No acute osseous abnormality identified. Sinuses/Orbits: Mild new mastoid effusions. Paranasal sinuses remain well pneumatized. Other: No acute orbit or scalp soft tissue finding. Negative visible nasopharynx. ASPECTS [facility] Stroke Program Early CT Score) Total score (0-10 with 10 being normal): 10 IMPRESSION: 1. Age indeterminate small vessel disease including in the right thalamus, but no acute cortically based infarct or acute intracranial hemorrhage identified. ASPECTS 10. 2. These results were communicated to Dr. Curly [place] At 10:56 pm on 11/13/2019 by text page via the [facility] messaging system. And also discussed by telephone at that same time. Electronically Signed   By: Genevie Ann M.D.   On: 11/13/2019 22:57    Labs:  CBC: Recent Labs    11/13/19 2244 11/13/19 2248 11/14/19 0351 11/15/19 0408  WBC 6.3  --   --  7.6  HGB 12.8 13.3 10.5* 9.8*  HCT 40.4 39.0 31.0* 31.2*  PLT 343  --   --  261    COAGS: Recent Labs    11/13/19 2244  INR 1.0  APTT 31    BMP: Recent Labs    11/13/19 2244 11/13/19 2248 11/14/19 0351 11/15/19 0408  NA 137 142 144 140  K 3.9 3.9 3.1* 3.9  CL 103 103  --  110  CO2 24  --   --  22  GLUCOSE 107* 102*  --  92  BUN 20 20  --  7*  CALCIUM 8.3*  --   --  7.9*  CREATININE 0.83 0.70  --  0.71  GFRNONAA >60  --   --  >60    LIVER FUNCTION TESTS: Recent Labs    11/13/19 2244 11/15/19 0408  BILITOT 0.8 0.9  AST 63* 24  ALT 61* 32  ALKPHOS 46 37*  PROT 6.2* 5.1*  ALBUMIN 2.8* 2.4*    Assessment and Plan:  History of acute CVA s/p cerebral  arteriogram with emergent mechanical thrombectomy of right PCA occlusion achieving a TICI 3 revascularization via right femoral approach 11/14/2019 by Dr. Earleen Newport. Patient's condition improving- awake and alert, following simple commands, moving all extremities. Right femoral puncture site currently soft but extremely tender- will obtain VAS [place] to rule-out pseudoaneurysm. Further plans per neurology/CCM- appreciate and agree with management. NIR to follow.   Electronically Signed: Earley Abide, PA-C 11/16/2019, 10:29 AM   I spent a total of 25 Minutes at the the patient's bedside AND on the patient's hospital floor or unit, greater than 50% of which was counseling/coordinating care for CVA s/p revascularization.

## 2019-11-16 NOTE — Procedures (Signed)
Interventional Radiology Procedure Note  Procedure:   US guided needle into the right CFA pseudoaneurysm.  Injection of thrombin solution of 5000U in 1ml.  Injection of ~3.5cc, equivalent to ~3500U.   No flow within the pseudo after injection.   Positive doppler at both the DP and the PT post procedure.   Complications: None  Recommendations:  - Bedrest and right hip straight until follow up duplex in the am.  - follow up duplex in the am - Do not submerge for 7 days - Routine wound care   Signed,  Dulcy Fanny. Earleen Newport, DO

## 2019-11-16 NOTE — Progress Notes (Signed)
STROKE TEAM PROGRESS NOTE   INTERVAL HISTORY Overnight pt had right groin swelling and tenderness at puncture site, found to have groin hematoma. Had [place] this am showed pseudoaneurysm, Dr. Earleen Newport performed thrombin injection. Will repeat US in am.  OBJECTIVE Vitals:   11/15/19 1949 11/15/19 2200 11/15/19 2336 11/16/19 0339  BP:  (!) 144/79    Pulse:  86    Resp:  (!) 24    Temp: 98.5 F (36.9 C)  99.5 F (37.5 C) 98.3 F (36.8 C)  TempSrc: Oral  Axillary Axillary  SpO2:      Weight:      Height:       CBC:  Recent Labs  Lab 11/13/19 2244 11/13/19 2248 11/15/19 0408 11/16/19 1206  WBC 6.3   < > 7.6 7.3  NEUTROABS 3.8  --   --   --   HGB 12.8   < > 9.8* 8.8*  HCT 40.4   < > 31.2* 27.0*  MCV 100.0   < > 100.3* 97.5  PLT 343   < > 261 267   < > = values in this interval not displayed.   Basic Metabolic Panel:  Recent Labs  Lab 11/15/19 0408 11/16/19 1206  NA 140 138  K 3.9 3.8  CL 110 105  CO2 22 27  GLUCOSE 92 119*  BUN 7* 6*  CREATININE 0.71 0.71  CALCIUM 7.9* 7.9*  MG 1.9  --    Lipid Panel:     Component Value Date/Time   CHOL 70 11/14/2019 0408   TRIG 126 11/14/2019 0408   TRIG 127 11/14/2019 0408   HDL 19 (L) 11/14/2019 0408   CHOLHDL 3.7 11/14/2019 0408   VLDL 25 11/14/2019 0408   LDLCALC 26 11/14/2019 0408   HgbA1c:  Lab Results  Component Value Date   HGBA1C 5.7 (H) 11/14/2019   Urine Drug Screen: No results found for: LABOPIA, COCAINSCRNUR, LABBENZ, AMPHETMU, THCU, LABBARB  Alcohol Level     Component Value Date/Time   ETH <10 11/13/2019 2244    IMAGING  CT Code Stroke CTA Head W/WO contrast CT Code Stroke CTA Neck W/WO contrast 11/13/2019 IMPRESSION:  1. Positive for occlusion of the Right PCA origin.  2. No other large vessel occlusion, and generally mild for age atherosclerosis in the head and neck.  3. Emphysema (ICD10-J43.9) with layering left pleural effusion, patchy left upper lobe opacity compatible with pneumonia in this  setting.  4. Aortic Atherosclerosis (ICD10-I70.0)   CT HEAD WO CONTRAST 11/14/2019 IMPRESSION:  1. Status post Right PCA endovascular revascularization with trace subarachnoid blood/contrast in the interpeduncular cistern.  2. Small 10 mm area of contrast staining versus petechial hemorrhage in the medial right thalamus. And question subtle increased right thalamic hypodensity elsewhere, but no other Right PCA territory infarct changes are identified.   MR BRAIN WO CONTRAST MR ANGIO HEAD WO CONTRAST 11/14/2019 IMPRESSION:  Acute infarct right thalamus with associated mild hemorrhage as noted on recent CT. There is also mild subarachnoid hemorrhage in the interpeduncular cistern unchanged from CT. Additional small areas of acute infarct in the right cerebellum and right frontal cortex. Moderate atrophy. Chronic microvascular ischemic change in the white matter. Right PCA patent post thrombectomy. There is a mild stenosis in the right PCA at the P1/ P2 junction.   [place] 11/14/2019 IMPRESSION:  Status post ultrasound guided access right common femoral artery for cervical and cerebral angiogram and mechanical thrombectomy of right posterior cerebral artery for treatment of  posterior circulation ELVO, with TICI 3 flow at the conclusion. PLAN: The patient will remain intubated, as small volume subarachnoid hemorrhage was confirmed with formal CT ICU status Target systolic blood pressure of 120-140 Right hip straight time 6 hours Frequent neurovascular checks Repeat neurologic imaging with CT and/MRI at the discretion of neurology team.  DG CHEST PORT 1 VIEW 11/14/2019 IMPRESSION: Endotracheal tube placed with tip measuring 4.7 cm above the carina. Cardiac enlargement with small bilateral pleural effusions. Probable chronic interstitial changes.   CT HEAD CODE STROKE WO CONTRAST 11/13/2019 IMPRESSION:  Age indeterminate small vessel disease including in the right thalamus, but no acute  cortically based infarct or acute intracranial hemorrhage identified. ASPECTS 10.   Transthoracic Echocardiogram  11/14/2019 1. Left ventricular ejection fraction, by estimation, is 60 to 65%. The left ventricle has normal function. The left ventricle has no regional wall motion abnormalities. There is severe asymmetric left ventricular hypertrophy of the basal-septal segment. Left ventricular diastolic function could not be evaluated.  2. Right ventricular systolic function is normal. The right ventricular size is normal. There is mildly elevated pulmonary artery systolic pressure.  3. Left atrial size was moderately dilated.  4. A small pericardial effusion is present. The pericardial effusion is circumferential.  5. The mitral valve is normal in structure. Trivial mitral valve regurgitation. No evidence of mitral stenosis. Moderate mitral annular calcification.  6. The aortic valve is tricuspid. There is mild calcification of the aortic valve. There is mild thickening of the aortic valve. Aortic valve regurgitation is trivial. Mild aortic valve sclerosis is present, with no evidence of aortic valve stenosis.  7. The inferior vena cava is normal in size with greater than 50% respiratory variability, suggesting right atrial pressure of 3 mmHg.   ECG - atrial fibrillation - ventricular response 87 BPM (See cardiology reading for complete details)   PHYSICAL EXAM   Temp:  [97.6 F (36.4 C)-99.5 F (37.5 C)] 98.3 F (36.8 C) (10/22 0339) Pulse Rate:  [73-86] 86 (10/21 2200) Resp:  [19-25] 24 (10/21 2200) BP: (119-147)/(52-79) 144/79 (10/21 2200) SpO2:  [93 %-97 %] 93 % (10/21 1800)  General - Well nourished, well developed, no acute distress.  Ophthalmologic - fundi not visualized due to noncooperation.  Cardiovascular - irregularly irregular heart rate and rhythm.  Mental Status -  Level of arousal and orientation to place, age, month and person were intact, however, not  orientated to year. Language exam showed no aphasia, able to name and repeat, follow simple commands.    Cranial Nerves II - XII - II - Visual field intact OU. III, IV, VI - Extraocular movements intact. V - Facial sensation intact bilaterally. VII - subtle left nasolabial fold flattening. VIII - Hearing & vestibular intact bilaterally. X - Palate elevates symmetrically. XI - Chin turning & shoulder shrug intact bilaterally. XII - Tongue protrusion intact.  Motor Strength - The patient's strength was normal in right upper and lower extremities, left upper extremity drift with mildly decreased finger grip, left LE 4/5 proximal and 4+/5 distally.  Bulk was normal and fasciculations were absent.   Motor Tone - Muscle tone was assessed at the neck and appendages and was normal.  Reflexes - The patient's reflexes were symmetrical in all extremities and she had no pathological reflexes.  Sensory - Light touch, temperature/pinprick were assessed and were symmetrical.    Coordination - The patient had normal movements in the hands with no ataxia or dysmetria.  Tremor was absent.  Gait and  Station - deferred.   ASSESSMENT/PLAN [name] is a 86 y.o. female with history of Afib/flutter s/p ablation, off AC due to persistent sinus rhythm, and a recent diagnosis of pneumonia (she was not improving on the first round of antibiotics tried and therefore was broadened to Augmentin on Monday), presenting with acute onset right gaze preference and left-sided weakness. The patient received IV t-PA Tuesday 11/19 at 10:59 PM and sent to IR for R PCA occlusion.  Stroke: R thalamic, cerebellar, and frontal small infarcts with right PCA occlusion s/p tPA and IR with TICI3 from AF not on AC Small SAH interpeduncular cistern post IR  CT Head - Age indeterminate small vessel disease including in the right thalamus, but no acute cortically based infarct or acute intracranial hemorrhage identified.   CTA  H&N - Positive for occlusion of the Right PCA origin. No other large vessel occlusion, and generally mild for age atherosclerosis in the head and neck.   Cerebral angio/ IR - Rt PCA occlusion with TICI 3 flow   CT head - Status post Right PCA endovascular revascularization with trace subarachnoid blood/contrast in the interpeduncular cistern. Small 10 mm area of contrast staining versus petechial hemorrhage in the medial right thalamus.   MRI head - R thalamus infarct with associated mild hemorrhage as noted on recent CT. There is also mild subarachnoid hemorrhage in the interpeduncular cistern unchanged from CT. Additional small areas of acute infarct in the right cerebellum and right frontal cortex. Moderate atrophy   MRA head -  Right PCA patent post thrombectomy. There is a mild stenosis in the right PCA at the P1/ P2 junction.   2D Echo - EF 60-65%. No source of embolus. LA moderately dilated  [employer] Virus 2 - negative  LDL - 26  HgbA1c - 5.7  VTE prophylaxis - Heparin 5000 units sq tid   aspirin 81 mg daily prior to admission, now on aspirin 325 mg daily. Consider AC once SAH resolved (probably in 5-7 days post stroke)    Therapy recommendations:  HH OT, HH PT  Disposition:  Pending - anticipate d/c home 10/23 if remains stable  R Groin Hematoma  Developed hospital D#2 at angio puncture site  Groin pseudoaneurysm [place] 2.0 x 2.4 R CFA pseudoaneurysm w/ hematoma  Thrombin injection 10/22 Earleen Newport)  Repeat US in am  Acute Respiratory Failure, resolved  Intubated for IR, left intubated post IR   Sedated w/ fentanyl and propofol, now off  Extubated 10/20  Patient stable  CCM signed off   Atrial Fibrillation/Aflutter  Home anticoagulation:  none   post ablation Dr. Ashley Mariner 2004. Doing very well. No further recurrences.  No AC now given post tPA small SAH   Now on aspirin 325  Consider AC once SAH resolved (probably in 5-7 days post stroke)     Follows  with Dr. Marlou Porch at [facility]  No response to intial abx  Broadened as OP to Augmention 10/18  Still has intermittent coughing  CTA - layering left pleural effusion, patchy left upper lobe opacity compatible with pneumonia  CXR 10/20 - small bilateral pleural effusions. Probable chronic interstitial changes.  WBCs 6.3->7.6->7.3 (afebrile)  Hypertension  Home BP meds: metoprolol   Treated short term w/ cleviprex, off this am  BP stable . BP goal < 160  . Long-term BP goal normotensive  Hyperlipidemia  Home Lipid lowering medication: Lipitor 10 mg daily  LDL 26, goal < 70  Will not resume lipitor due to low LDL level  Dysphagia, resolved . Secondary to stroke . Cleared for regular consistency, thin liquid diet . Off IVF  . Sore throat after extubation   Other Stroke Risk Factors  Advanced age  Former cigarette smoker - quit  ETOH use, advised to drink no more than 1 alcoholic beverage per day.  Other Active Problems  Code status - Full code  Emphysema ([id number])   Aortic Atherosclerosis (ICD10-I70.0)   Anemia - Hgb - 12.8->13.3-> 10.5->9.8->8.8  Hypokalemia - K 3.8 - resolved  Hospital day # 3   Melissa Hawking, MD PhD Stroke Neurology 11/16/2019 2:22 PM    To contact Stroke Continuity provider, please refer to [email or web address] After hours, contact General Neurology

## 2019-11-16 NOTE — Progress Notes (Signed)
PT Cancellation Note  Patient Details Name: TIFANY HIRSCH MRN: [id number] DOB: 22-Aug-1931   Cancelled Treatment:    Reason Eval/Treat Not Completed: Patient not medically ready (pt on bedrest due to groin hematoma)   Chela Sutphen B Jannie Doyle 11/16/2019, 11:03 AM  Bayard Males, PT Acute Rehabilitation Services Pager: [phone] Office: [phone]

## 2019-11-16 NOTE — Progress Notes (Signed)
RN consulted on call Neuro MD Leonel Ramsay) for the presence of swelling and pain/tenderness at insertion site, R groin. R thrombectomy was preformed 10/20. Consulted with Dr. Earleen Newport, via phone, who advised to mark perimeters of hematoma, hold pressure on insertion site for 10 minutes, place pt on bedrest, and keep R leg straight . Dr. Leonel Ramsay ordered duplex U/S.

## 2019-11-16 NOTE — Plan of Care (Signed)
  Problem: Self-Care: Goal: Verbalization of feelings and concerns over difficulty with self-care will improve Outcome: Progressing   Problem: Self-Care: Goal: Ability to communicate needs accurately will improve Outcome: Progressing   Problem: Nutrition: Goal: Dietary intake will improve Outcome: Progressing

## 2019-11-16 NOTE — Progress Notes (Signed)
Pt has been admitted to the room via hospital bed. All belongings have been sent to the room.Son at bedside.    11/16/19 1546  Vitals  Temp 98.4 F (36.9 C)  Temp Source Oral  BP 136/68  MAP (mmHg) 87  BP Location Left Arm  BP Method Automatic  Patient Position (if appropriate) Lying  Pulse Rate 87  Pulse Rate Source Dinamap  Resp 20  Level of Consciousness  Level of Consciousness Alert  MEWS COLOR  MEWS Score Color Green  Oxygen Therapy  SpO2 99 %  O2 Device Nasal Cannula  O2 Flow Rate (L/min) 2 L/min  Patient Activity (if Appropriate) In bed  Pulse Oximetry Type Intermittent  Pain Assessment  Pain Scale 0-10  Pain Score 0  ECG Monitoring  QRS interval 0.1  QT interval 0.4  CV Strip Heart Rate 82  Cardiac Rhythm Atrial fibrillation (ADMIT STRIP)  MEWS Score  MEWS Temp 0  MEWS Systolic 0  MEWS Pulse 0  MEWS RR 0  MEWS LOC 0  MEWS Score 0

## 2019-11-16 NOTE — Progress Notes (Signed)
RN continuing to assess hematoma. Slight decrease in size and hardness. Pt states pain only to touch. Palpable pedal pulse.

## 2019-11-16 NOTE — Progress Notes (Signed)
[place] groin pseudoaneurysm study completed.  Preliminary results relayed to [facility], DO.   See CV Proc for preliminary results report.   Darlin Coco, RDMS

## 2019-11-17 ENCOUNTER — Inpatient Hospital Stay (HOSPITAL_COMMUNITY): Payer: Medicare Other

## 2019-11-17 DIAGNOSIS — I63531 Cerebral infarction due to unspecified occlusion or stenosis of right posterior cerebral artery: Secondary | ICD-10-CM | POA: Diagnosis not present

## 2019-11-17 DIAGNOSIS — I724 Aneurysm of artery of lower extremity: Secondary | ICD-10-CM | POA: Diagnosis not present

## 2019-11-17 LAB — CBC
HCT: 28.3 % — ABNORMAL LOW (ref 36.0–46.0)
Hemoglobin: 9.1 g/dL — ABNORMAL LOW (ref 12.0–15.0)
MCH: 31.7 pg (ref 26.0–34.0)
MCHC: 32.2 g/dL (ref 30.0–36.0)
MCV: 98.6 fL (ref 80.0–100.0)
Platelets: 283 10*3/uL (ref 150–400)
RBC: 2.87 MIL/uL — ABNORMAL LOW (ref 3.87–5.11)
RDW: 12.4 % (ref 11.5–15.5)
WBC: 9.2 10*3/uL (ref 4.0–10.5)
nRBC: 0 % (ref 0.0–0.2)

## 2019-11-17 LAB — BASIC METABOLIC PANEL
Anion gap: 7 (ref 5–15)
BUN: 5 mg/dL — ABNORMAL LOW (ref 8–23)
CO2: 27 mmol/L (ref 22–32)
Calcium: 8.1 mg/dL — ABNORMAL LOW (ref 8.9–10.3)
Chloride: 105 mmol/L (ref 98–111)
Creatinine, Ser: 0.66 mg/dL (ref 0.44–1.00)
GFR, Estimated: >60 mL/min (ref >60)
Glucose, Bld: 117 mg/dL — ABNORMAL HIGH (ref 70–99)
Potassium: 4 mmol/L (ref 3.5–5.1)
Sodium: 139 mmol/L (ref 135–145)

## 2019-11-17 MED ORDER — SODIUM CHLORIDE 0.9 % IV BOLUS
1000.0000 mL | Freq: Once | INTRAVENOUS | Status: AC
  Administered 2019-11-17: 1000 mL via INTRAVENOUS

## 2019-11-17 MED ORDER — BISACODYL 10 MG RE SUPP
10.0000 mg | Freq: Every day | RECTAL | Status: DC | PRN
  Filled 2019-11-17: qty 1

## 2019-11-17 MED ORDER — APIXABAN 5 MG PO TABS
5.0000 mg | ORAL_TABLET | Freq: Two times a day (BID) | ORAL | Status: DC
  Administered 2019-11-18 – 2019-11-20 (×5): 5 mg via ORAL
  Filled 2019-11-17 (×5): qty 1

## 2019-11-17 MED ORDER — DOCUSATE SODIUM 100 MG PO CAPS
100.0000 mg | ORAL_CAPSULE | Freq: Two times a day (BID) | ORAL | Status: DC
  Administered 2019-11-17 – 2019-11-23 (×13): 100 mg via ORAL
  Filled 2019-11-17 (×13): qty 1

## 2019-11-17 NOTE — Progress Notes (Signed)
Inpatient Rehab Admissions:  Inpatient Rehab Consult received.  I met with patient and daughter, Seth Bake at the bedside for rehabilitation assessment and to discuss goals and expectations of an inpatient rehab admission.  Both acknowledged understanding of CIR goals and expectations.  Both interested in CIR.  Pt appears to be an appropriate candidate for potential admission.  Will continue to follow.  Signed: Gayland Curry, [place], [place] Admissions Coordinator [phone]

## 2019-11-17 NOTE — Progress Notes (Signed)
Physical Therapy Treatment Patient Details Name: [name] MRN: [id number] DOB: 1931-12-30 Today's Date: 11/17/2019    History of Present Illness 86 yo with acute right thalamic infarct s/p thrombectomy of Rt PCA 10/20 with revascularization, extubated 10/20. PMHx:Afib, SVT    PT Comments    Pt supine in bed on arrival this session.  She has been in bed since Wednesday and presents with significant decrease in strength.  Pt also limited due to dizziness in sitting. Pt required mod to max assistance to mobilize.  Based on decline in function will update recommendations to CIR.  Will inform supervising PT of need for change in recommendations at this time.   Pt complained of dizziness in sitting so return to supine and obtained the following BPs.   Lying:164/84 Sitting edge of bed:146/75 Sitting in recliner after transfer: 136/71  Follow Up Recommendations  CIR;Supervision/Assistance - 24 hour     Equipment Recommendations  Rolling walker with 5" wheels    Recommendations for Other Services       Precautions / Restrictions Precautions Precautions: Fall Precaution Comments: watch sats Restrictions Weight Bearing Restrictions: No    Mobility  Bed Mobility Overal bed mobility: Needs Assistance Bed Mobility: Supine to Sit     Supine to sit: Mod assist     General bed mobility comments: Pt able to start with LE advancement to edge of bed but unable to elevate trunk in sitting.  Presents with posterior lean in sitting.  Pt required multimodal cues for weight shift forward.  Transfers Overall transfer level: Needs assistance Equipment used: None Transfers: Sit to/from Stand Sit to Stand: Max assist;From elevated surface;+2 safety/equipment (FACE to [facility] assistance to move from surface to surface.)         General transfer comment: Pt dizzy and performed stand pivot from bed to recliner to acclimate her to upright seated position.  Ambulation/Gait Ambulation/Gait  assistance:  (Unable due to dizziness.)               Stairs             Wheelchair Mobility    Modified Rankin (Stroke Patients Only)       Balance Overall balance assessment: Needs assistance Sitting-balance support: No upper extremity supported;Feet supported Sitting balance-Leahy Scale: Poor Sitting balance - Comments: posterior lean.   Standing balance support: Bilateral upper extremity supported Standing balance-Leahy Scale: Zero                              Cognition Arousal/Alertness: Awake/alert Behavior During Therapy: WFL for tasks assessed/performed Overall Cognitive Status: Within Functional Limits for tasks assessed                                 General Comments: Not formally assessed but appears within functional limits for tasks assessed.      Exercises      General Comments        Pertinent Vitals/Pain Pain Assessment: Faces Faces Pain Scale: Hurts whole lot Pain Location: R groin and hip.    Home Living                      Prior Function            PT Goals (current goals can now be found in the care plan section) Acute Rehab PT Goals Patient Stated Goal:  return home  Potential to Achieve Goals: Good Progress towards PT goals: Progressing toward goals    Frequency    Min 3X/week      PT Plan Discharge plan needs to be updated    Co-evaluation              AM-PAC PT "6 Clicks" Mobility   Outcome Measure  Help needed turning from your back to your side while in a flat bed without using bedrails?: A Little Help needed moving from lying on your back to sitting on the side of a flat bed without using bedrails?: A Little Help needed moving to and from a bed to a chair (including a wheelchair)?: A Little Help needed standing up from a chair using your arms (e.g., wheelchair or bedside chair)?: A Little Help needed to walk in hospital room?: A Little Help needed climbing 3-5  steps with a railing? : A Lot 6 Click Score: 17    End of Session Equipment Utilized During Treatment: Gait belt Activity Tolerance: Patient tolerated treatment well Patient left: in chair;with call bell/phone within reach;with family/visitor present Nurse Communication: Mobility status;Precautions PT Visit Diagnosis: Difficulty in walking, not elsewhere classified (R26.2);Other abnormalities of gait and mobility (R26.89)     Time: 6381-7711 PT Time Calculation (min) (ACUTE ONLY): 31 min  Charges:  $Therapeutic Activity: 23-37 mins                     Erasmo Leventhal , PTA Acute Rehabilitation Services Pager [phone] Office [phone]     Petula Rotolo Eli Hose 11/17/2019, 11:42 AM

## 2019-11-17 NOTE — Progress Notes (Signed)
Inpatient Rehab Admissions Coordinator Note:   Per PT/OT recommendations, pt was screened for CIR candidacy by .  At this time we are recommending an inpatient rehab consult.  AC will contact attending to request consult order.  Please contact me with questions.      Gayland Curry, [place], [place] Admissions Coordinator [phone] 11/17/19 3:03 PM

## 2019-11-17 NOTE — Progress Notes (Signed)
Referring Physician(s): Bhagat,S  Supervising Physician: Arne Cleveland  Patient Status:  [facility] - In-pt  Chief Complaint:  Rt groin tenderness  Subjective: History of acute CVA s/p cerebral arteriogram with emergent mechanical thrombectomy of right PCA occlusion achieving a TICI 3 revascularization via right femoral approach 11/14/2019 by Dr. Earleen Newport; underwent thrombin injection of rt CFA PSA yesterday; still has some rt groin discomfort but not worsening; did have some lightheadedness upon sitting in chair this am; intermittent nausea, no HA/CP, worsening resp issues; no other acute neuro changes   Allergies: Bee venom, Mixed vespid venom, Codeine, Sulfa antibiotics, and Erythromycin  Medications: Prior to Admission medications   Medication Sig Start Date End Date Taking? Authorizing Provider  amoxicillin-clavulanate (AUGMENTIN) 875-125 MG tablet Take 1 tablet by mouth 2 (two) times daily. 11/12/19  Yes [provider]  aspirin 81 MG tablet Take 1 tablet (81 mg total) by mouth daily. 01/08/13  Yes Carlena Bjornstad, MD  atorvastatin (LIPITOR) 10 MG tablet Take 10 mg by mouth daily.   Yes [provider]  BIOTIN PO Take 1 tablet by mouth daily.    Yes [provider]  CALCIUM PO Take 1 tablet by mouth daily.    Yes [provider]  Cholecalciferol (VITAMIN D3) 50 MCG (2000 UT) CAPS Take 2,000 Units by mouth daily.   Yes [provider]  Coenzyme Q10 (CO Q-10 PO) Take 1 tablet by mouth daily.    Yes [provider]  metoprolol succinate (TOPROL-XL) 50 MG 24 hr tablet Take 50 mg by mouth daily. Take with or immediately following a meal.   Yes [provider]  EPINEPHrine 0.3 mg/0.3 mL IJ SOAJ injection Inject 0.3 mg into the muscle as needed for anaphylaxis.  08/04/18   [provider]  Investigational - Study Medication Additional Study Details    [provider]  trimethoprim (TRIMPEX) 100 MG tablet Take  100 mg by mouth as directed. Patient not taking: Reported on 11/14/2019    [provider]     Vital Signs: BP (!) 139/92 (BP Location: Left Arm)   Pulse 92   Temp 98.5 F (36.9 C) (Oral)   Resp 20   Ht '5\' 1"'  (1.549 m)   Wt 146 lb 6.2 oz (66.4 kg)   LMP  (LMP Unknown)   SpO2 95%   BMI 27.66 kg/m   Physical Exam awake/alert; rt CFA site soft, tender to palpation, no active bleeding externally, no increased ecchymoses, no bruit noted in region; moving all fours, speech ok, no visual changes, right distal pulses 2+, foot warm  Imaging: CT Code Stroke CTA Head W/WO contrast  Result Date: 11/13/2019 CLINICAL DATA:  86 year old female code stroke presentation. Presentation suspicious for LV0, receiving IV tPA. History also of recent outpatient treatment for pneumonia. EXAM: CT ANGIOGRAPHY HEAD AND NECK TECHNIQUE: Multidetector CT imaging of the head and neck was performed using the standard protocol during bolus administration of intravenous contrast. Multiplanar CT image reconstructions and MIPs were obtained to evaluate the vascular anatomy. Carotid stenosis measurements (when applicable) are obtained utilizing NASCET criteria, using the distal internal carotid diameter as the denominator. CONTRAST:  60 mL Omnipaque 350. COMPARISON:  Plain head CT 2253 hours today. FINDINGS: CTA NECK Skeleton: Widespread advanced cervical spine degeneration. No acute osseous abnormality identified. Upper chest: Small to moderate layering left pleural effusion. Moderate to severe emphysema in the visible upper lungs. Patchy additional peripheral left upper lobe opacity. No superior mediastinal lymphadenopathy. Other neck:  No acute findings. Aortic arch: Mild to moderate arch atherosclerosis. Three vessel arch configuration. Right carotid system: Brachiocephalic artery and right CCA origin within normal limits. Mild plaque proximal to the bifurcation without stenosis. Mild soft plaque at the medial right  ICA origin without stenosis. Partially retropharyngeal course of the right ICA without stenosis to the skull base. Left carotid system: Mild plaque at the left CCA origin and proximal to the bifurcation without stenosis. Negative bifurcation. Tortuous left ICA with a partially retropharyngeal course, no stenosis. Vertebral arteries: Proximal right subclavian artery plaque without stenosis. Normal right vertebral artery origin. Right vertebral artery is patent and within normal limits to the skull base. Mild to moderate proximal left subclavian artery plaque without significant stenosis. Normal left vertebral artery origin. Tortuous left V1 segment. Left vertebral artery is fairly codominant and patent to the skull base without stenosis. CTA HEAD Posterior circulation: Codominant distal vertebral arteries are patent to the vertebrobasilar junction without plaque or stenosis. The a ICAs may be dominant. Patent basilar artery without stenosis. Patent SCA and left PCA origins. However, the right PCA is occluded just beyond its origin (series 11, image 26) with poor reconstitution distally. The left PCA P2 and P3 segments are mildly irregular with mild stenosis. Anterior circulation: Both ICA siphons are patent. Mild calcified plaque on the left. Mild left supraclinoid ICA stenosis. Similar mild calcified plaque on the right and mild supraclinoid stenosis. Patent carotid termini. Posterior communicating arteries are diminutive or absent. Patent MCA and ACA origins. The left ACA is dominant and the right is diminutive with subsequent azygos type ACA anatomy. No definite ACA occlusion. Left MCA M1 segment and trifurcation are patent without stenosis. Right MCA M1 segment and bifurcation are patent without stenosis. Bilateral MCA branches appear patent with mild irregularity. Venous sinuses: Early contrast timing, not evaluated. Anatomic variants: Dominant left ACA with azygos type ACA anatomy. Review of the MIP images  confirms the above findings IMPRESSION: 1. Positive for occlusion of the Right PCA origin. 2. No other large vessel occlusion, and generally mild for age atherosclerosis in the head and neck. 3. Emphysema (ICD10-J43.9) with layering left pleural effusion, patchy left upper lobe opacity compatible with pneumonia in this setting. 4. Aortic Atherosclerosis (ICD10-I70.0) Preliminary report of the above discussed by telephone with Dr. Lesleigh Noe on 11/13/2019 at 2314 hours. Electronically Signed   By: Genevie Ann M.D.   On: 11/13/2019 23:25   CT HEAD WO CONTRAST  Result Date: 11/14/2019 CLINICAL DATA:  86 year old female approaching 24 hours status post IV tPA and NIR revascularization of the right PCA. EXAM: CT HEAD WITHOUT CONTRAST TECHNIQUE: Contiguous axial images were obtained from the base of the skull through the vertex without intravenous contrast. COMPARISON:  Post treatment head CT 0210 hours today, brain MRI and intracranial MRA 0716 hours today. FINDINGS: Brain: Stable to slightly regressed trace subarachnoid blood in the interpeduncular cistern (series 3, image 13). No intraventricular hemorrhage or other extra-axial blood identified. Resolved contrast staining or less likely evolved intra-axial blood products associated with the patchy right thalamic infarct (series 3, image 16 now). No significant mass effect. Gray-white matter differentiation elsewhere remains stable. No new areas of cytotoxic edema. Stable ventricle size and configuration. No intracranial mass effect. Vascular: Calcified atherosclerosis at the skull base. Skull: No acute osseous abnormality identified. Sinuses/Orbits: Improved sinus aeration. Mild to moderate residual mucosal thickening most pronounced in the right sphenoid. Bilateral mastoid effusions persist. Other: Negative orbit and scalp soft tissues. IMPRESSION: 1. Stable patchy  right thalamic infarct with resolved contrast staining. No significant mass effect. No other acute  or evolving ischemia by CT. 2. Stable to slightly regressed trace subarachnoid blood in the interpeduncular cistern. No other intracranial hemorrhage. Electronically Signed   By: Genevie Ann M.D.   On: 11/14/2019 21:43   CT HEAD WO CONTRAST  Result Date: 11/14/2019 CLINICAL DATA:  86 year old female code stroke presentation with right PCA occlusion. Status post Neuro Interventional endovascular revascularization. EXAM: CT HEAD WITHOUT CONTRAST TECHNIQUE: Contiguous axial images were obtained from the base of the skull through the vertex without intravenous contrast. COMPARISON:  Head CT 2253 hours 11/13/2019. FINDINGS: Brain: There is a small subarachnoid collection of blood and contrast in the interpeduncular cistern (series 3, image 13). The other basilar cisterns remain normal. No intraventricular blood. Small and indistinct 10 mm area of contrast staining versus petechial hemorrhage in the medial right thalamus (series 3, image 16). Asymmetric right thalamic hypodense heterogeneity elsewhere appears slightly increased from the presentation CT. No cytotoxic edema identified elsewhere in the right PCA territory. Stable gray-white matter differentiation elsewhere. No other abnormal enhancement or intracranial blood identified. Vascular: Residual intravascular contrast. Skull: No acute osseous abnormality identified. Sinuses/Orbits: Small fluid levels and mild mucosal thickening now in the sinuses. Stable mild mastoid effusions. Other: Intubated. Small volume fluid in the pharynx. No acute orbit or scalp soft tissue findings. IMPRESSION: 1. Status post Right PCA endovascular revascularization with trace subarachnoid blood/contrast in the interpeduncular cistern. 2. Small 10 mm area of contrast staining versus petechial hemorrhage in the medial right thalamus. And question subtle increased right thalamic hypodensity elsewhere, but no other Right PCA territory infarct changes are identified. These results were  communicated to Dr. Curly Shores at 2:25 am on 11/14/2019 by text page via the [facility] messaging system. Electronically Signed   By: Genevie Ann M.D.   On: 11/14/2019 02:26   CT Code Stroke CTA Neck W/WO contrast  Result Date: 11/13/2019 CLINICAL DATA:  86 year old female code stroke presentation. Presentation suspicious for LV0, receiving IV tPA. History also of recent outpatient treatment for pneumonia. EXAM: CT ANGIOGRAPHY HEAD AND NECK TECHNIQUE: Multidetector CT imaging of the head and neck was performed using the standard protocol during bolus administration of intravenous contrast. Multiplanar CT image reconstructions and MIPs were obtained to evaluate the vascular anatomy. Carotid stenosis measurements (when applicable) are obtained utilizing NASCET criteria, using the distal internal carotid diameter as the denominator. CONTRAST:  60 mL Omnipaque 350. COMPARISON:  Plain head CT 2253 hours today. FINDINGS: CTA NECK Skeleton: Widespread advanced cervical spine degeneration. No acute osseous abnormality identified. Upper chest: Small to moderate layering left pleural effusion. Moderate to severe emphysema in the visible upper lungs. Patchy additional peripheral left upper lobe opacity. No superior mediastinal lymphadenopathy. Other neck: No acute findings. Aortic arch: Mild to moderate arch atherosclerosis. Three vessel arch configuration. Right carotid system: Brachiocephalic artery and right CCA origin within normal limits. Mild plaque proximal to the bifurcation without stenosis. Mild soft plaque at the medial right ICA origin without stenosis. Partially retropharyngeal course of the right ICA without stenosis to the skull base. Left carotid system: Mild plaque at the left CCA origin and proximal to the bifurcation without stenosis. Negative bifurcation. Tortuous left ICA with a partially retropharyngeal course, no stenosis. Vertebral arteries: Proximal right subclavian artery plaque without stenosis. Normal right  vertebral artery origin. Right vertebral artery is patent and within normal limits to the skull base. Mild to moderate proximal left subclavian artery plaque without significant  stenosis. Normal left vertebral artery origin. Tortuous left V1 segment. Left vertebral artery is fairly codominant and patent to the skull base without stenosis. CTA HEAD Posterior circulation: Codominant distal vertebral arteries are patent to the vertebrobasilar junction without plaque or stenosis. The a ICAs may be dominant. Patent basilar artery without stenosis. Patent SCA and left PCA origins. However, the right PCA is occluded just beyond its origin (series 11, image 26) with poor reconstitution distally. The left PCA P2 and P3 segments are mildly irregular with mild stenosis. Anterior circulation: Both ICA siphons are patent. Mild calcified plaque on the left. Mild left supraclinoid ICA stenosis. Similar mild calcified plaque on the right and mild supraclinoid stenosis. Patent carotid termini. Posterior communicating arteries are diminutive or absent. Patent MCA and ACA origins. The left ACA is dominant and the right is diminutive with subsequent azygos type ACA anatomy. No definite ACA occlusion. Left MCA M1 segment and trifurcation are patent without stenosis. Right MCA M1 segment and bifurcation are patent without stenosis. Bilateral MCA branches appear patent with mild irregularity. Venous sinuses: Early contrast timing, not evaluated. Anatomic variants: Dominant left ACA with azygos type ACA anatomy. Review of the MIP images confirms the above findings IMPRESSION: 1. Positive for occlusion of the Right PCA origin. 2. No other large vessel occlusion, and generally mild for age atherosclerosis in the head and neck. 3. Emphysema (ICD10-J43.9) with layering left pleural effusion, patchy left upper lobe opacity compatible with pneumonia in this setting. 4. Aortic Atherosclerosis (ICD10-I70.0) Preliminary report of the above  discussed by telephone with Dr. Lesleigh Noe on 11/13/2019 at 2314 hours. Electronically Signed   By: Genevie Ann M.D.   On: 11/13/2019 23:25   MR ANGIO HEAD WO CONTRAST  Result Date: 11/14/2019 CLINICAL DATA:  Stroke follow-up. Right posterior cerebral artery thrombectomy 11/14/2019 EXAM: MRI HEAD WITHOUT CONTRAST MRA HEAD WITHOUT CONTRAST TECHNIQUE: Multiplanar, multiecho pulse sequences of the brain and surrounding structures were obtained without intravenous contrast. Angiographic images of the head were obtained using MRA technique without contrast. COMPARISON:  CT head 11/14/2019 FINDINGS: MRI HEAD FINDINGS Brain: Restricted diffusion right thalamus compatible with acute infarct. Additional small areas of acute infarct in the right inferior and right superior cerebellum. Small area of acute infarct in the right frontal cortex over the convexity. Small amount of hemorrhage in the right thalamus and in the subarachnoid space in the interpeduncular cistern as noted on CT. Generalized atrophy. Ventricular enlargement is stable. Periventricular deep white matter hyperintensities bilaterally compatible with chronic ischemia. No mass lesion. Vascular: Normal arterial flow voids. Skull and upper cervical spine: No focal lesion. Sinuses/Orbits: Mucosal edema paranasal sinuses with air-fluid level right maxillary sinus. Bilateral cataract extraction Other: None MRA HEAD FINDINGS Both vertebral arteries patent to the basilar without stenosis. PICA not visualized. AICA patent bilaterally. Basilar widely patent. Superior cerebellar and posterior cerebral arteries patent bilaterally. Mild stenosis right P1/P2 junction with good distal flow in the right posterior cerebral artery post thrombectomy. Left PCA widely patent. Internal carotid artery widely patent bilaterally. Anterior and middle cerebral arteries widely patent without stenosis or large vessel occlusion. Negative for aneurysm. IMPRESSION: Acute infarct right  thalamus with associated mild hemorrhage as noted on recent CT. There is also mild subarachnoid hemorrhage in the interpeduncular cistern unchanged from CT. Additional small areas of acute infarct in the right cerebellum and right frontal cortex. Moderate atrophy. Chronic microvascular ischemic change in the white matter. Right PCA patent post thrombectomy. There is a mild stenosis in the right  PCA at the P1/ P2 junction. Electronically Signed   By: Franchot Gallo M.D.   On: 11/14/2019 08:26   MR BRAIN WO CONTRAST  Result Date: 11/14/2019 CLINICAL DATA:  Stroke follow-up. Right posterior cerebral artery thrombectomy 11/14/2019 EXAM: MRI HEAD WITHOUT CONTRAST MRA HEAD WITHOUT CONTRAST TECHNIQUE: Multiplanar, multiecho pulse sequences of the brain and surrounding structures were obtained without intravenous contrast. Angiographic images of the head were obtained using MRA technique without contrast. COMPARISON:  CT head 11/14/2019 FINDINGS: MRI HEAD FINDINGS Brain: Restricted diffusion right thalamus compatible with acute infarct. Additional small areas of acute infarct in the right inferior and right superior cerebellum. Small area of acute infarct in the right frontal cortex over the convexity. Small amount of hemorrhage in the right thalamus and in the subarachnoid space in the interpeduncular cistern as noted on CT. Generalized atrophy. Ventricular enlargement is stable. Periventricular deep white matter hyperintensities bilaterally compatible with chronic ischemia. No mass lesion. Vascular: Normal arterial flow voids. Skull and upper cervical spine: No focal lesion. Sinuses/Orbits: Mucosal edema paranasal sinuses with air-fluid level right maxillary sinus. Bilateral cataract extraction Other: None MRA HEAD FINDINGS Both vertebral arteries patent to the basilar without stenosis. PICA not visualized. AICA patent bilaterally. Basilar widely patent. Superior cerebellar and posterior cerebral arteries patent  bilaterally. Mild stenosis right P1/P2 junction with good distal flow in the right posterior cerebral artery post thrombectomy. Left PCA widely patent. Internal carotid artery widely patent bilaterally. Anterior and middle cerebral arteries widely patent without stenosis or large vessel occlusion. Negative for aneurysm. IMPRESSION: Acute infarct right thalamus with associated mild hemorrhage as noted on recent CT. There is also mild subarachnoid hemorrhage in the interpeduncular cistern unchanged from CT. Additional small areas of acute infarct in the right cerebellum and right frontal cortex. Moderate atrophy. Chronic microvascular ischemic change in the white matter. Right PCA patent post thrombectomy. There is a mild stenosis in the right PCA at the P1/ P2 junction. Electronically Signed   By: Franchot Gallo M.D.   On: 11/14/2019 08:26   US Guided Needle Placement  Result Date: 11/16/2019 INDICATION: 86 year old female with a history of right common femoral artery pseudoaneurysm status post access for mechanical thrombectomy 11/14/2019 EXAM: ULTRASOUND-GUIDED NEEDLE PLACEMENT RIGHT COMMON FEMORAL ARTERY PSEUDOANEURYSM IMAGING GUIDED THROMBIN INJECTION MEDICATIONS: 3500 units thrombin ANESTHESIA/SEDATION: None Patient's level of consciousness and vital signs were monitored continuously by radiology nursing throughout the procedure under my direct supervision. FLUOROSCOPY TIME:  Ultrasound COMPLICATIONS: None PROCEDURE: Informed written consent was obtained from the patient after a thorough discussion of the procedural risks, benefits and alternatives. All questions were addressed. Maximal Sterile Barrier Technique was utilized including caps, mask, sterile gowns, sterile gloves, sterile drape, hand hygiene and skin antiseptic. A timeout was performed prior to the initiation of the procedure. Patient was positioned supine position on the ultrasound stretcher. Ultrasound images of the right inguinal region were  performed with images stored sent to PACs. The patient was then prepped and draped in the usual sterile fashion. 1% lidocaine was used for local anesthesia. 5000 units of bovine thrombin was reconstituted in the proprietary package. A 22 gauge spinal needle was used to access the dome of the pseudoaneurysm with ultrasound guidance, at a site as far distal from the perceived neck as possible, on the inferior aspect. A 6 inch tubing was then attached to the needle after the stylet was removed with arterial blood return. A 6 inch tubing set was attached to a 3 way stopcock. 5 cc/5000 units  of bovine reconstituted thrombin were drawn into a 5 cc syringe, attached to 1 of the side arm of the stopcock. The other side arm excepted a 3 cc syringe. Under ultrasound observation, small aliquots of the thrombin were injected using the 3 cc syringe. After approximately 2000 units were injected, we paused for observation. After we observed 2 minutes there was continuous flow in the dome. Upon attempted further injection the needle was occluded. This needle was removed and a new spinal needle was placed. Stylet was removed confirming arterial blood return. The short tubing set was reattached and additional 1500 units were slowly injected in small aliquots under ultrasound observation. Ultimately we achieved stagnant flow with thrombosis of the pseudoaneurysm. Needle was removed and final images were stored. We confirmed positive maintain Doppler signal in both the dorsalis pedis and posterior tibial at the right foot. Foot remains warm. No complications were encountered and no significant blood loss. Sterile dressing was placed. 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 Signed, Dulcy Fanny. Dellia Nims, RPVI [facility] Electronically Signed   By: Corrie Mckusick MelissaO.   On: 11/16/2019 15:39    [place] [employer]  Result Date: 11/16/2019 INDICATION: 86 year old female with a history of right common femoral artery pseudoaneurysm status post access for mechanical thrombectomy 11/14/2019 EXAM: ULTRASOUND-GUIDED NEEDLE PLACEMENT RIGHT COMMON FEMORAL ARTERY PSEUDOANEURYSM IMAGING GUIDED THROMBIN INJECTION MEDICATIONS: 3500 units thrombin ANESTHESIA/SEDATION: None Patient's level of consciousness and vital signs were monitored continuously by radiology nursing throughout the procedure under my direct supervision. FLUOROSCOPY TIME:  Ultrasound COMPLICATIONS: None PROCEDURE: Informed written consent was obtained from the patient after a thorough discussion of the procedural risks, benefits and alternatives. All questions were addressed. Maximal Sterile Barrier Technique was utilized including caps, mask, sterile gowns, sterile gloves, sterile drape, hand hygiene and skin antiseptic. A timeout was performed prior to the initiation of the procedure. Patient was positioned supine position on the ultrasound stretcher. Ultrasound images of the right inguinal region were performed with images stored sent to PACs. The patient was then prepped and draped in the usual sterile fashion. 1% lidocaine was used for local anesthesia. 5000 units of bovine thrombin was reconstituted in the proprietary package. A 22 gauge spinal needle was used to access the dome of the pseudoaneurysm with ultrasound guidance, at a site as far distal from the perceived neck as possible, on the inferior aspect. A 6 inch tubing was then attached to the needle after the stylet was removed with arterial blood return. A 6 inch tubing set was attached to a 3 way stopcock. 5 cc/5000 units of bovine reconstituted thrombin were drawn into a 5 cc syringe, attached to 1 of the side arm of the stopcock. The other side arm excepted a 3 cc syringe. Under ultrasound observation, small aliquots of the thrombin were injected using the 3 cc syringe.  After approximately 2000 units were injected, we paused for observation. After we observed 2 minutes there was continuous flow in the dome. Upon attempted further injection the needle was occluded. This needle was removed and a new spinal needle was placed. Stylet was removed confirming arterial blood return. The short tubing set was reattached and additional 1500 units were slowly injected in small aliquots under ultrasound observation. Ultimately we achieved stagnant flow with thrombosis of the pseudoaneurysm. Needle was removed and final images were stored. We confirmed positive maintain Doppler signal in both the dorsalis  pedis and posterior tibial at the right foot. Foot remains warm. No complications were encountered and no significant blood loss. Sterile dressing was placed. 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 Signed, Dulcy Fanny. Dellia Nims, RPVI Vascular and Interventional Radiology Specialists [facility] Radiology Electronically Signed   By: Corrie Mckusick MelissaO.   On: 11/16/2019 15:39   IR CT Head Ltd  Result Date: 11/14/2019 INDICATION: 86 year old female presents for mechanical thrombectomy with emergent large vessel occlusion involving the right posterior cerebral artery EXAM: ULTRASOUND-GUIDED ACCESS RIGHT COMMON FEMORAL ARTERY CERVICAL AND CEREBRAL ANGIOGRAM MECHANICAL THROMBECTOMY RIGHT PCA ANGIO-SEAL FOR HEMOSTASIS COMPARISON:  CT imaging same day MEDICATIONS: None ANESTHESIA/SEDATION: The anesthesia team was present to provide general endotracheal tube anesthesia and for patient monitoring during the procedure. Intubation was performed in negative pressure Bay in neuro IR holding. Left radial arterial line was performed by the anesthesia team. Interventional neuro radiology nursing staff was also present. CONTRAST:  100 cc contrast FLUOROSCOPY TIME:  Fluoroscopy Time: 26 minutes 18 seconds (1961 mGy).  COMPLICATIONS: SIR Level A - No therapy, no consequence. TECHNIQUE: Informed written consent was obtained from the patient's family after a thorough discussion of the procedural risks, benefits and alternatives. Specific risks discussed include: Bleeding, infection, contrast reaction, kidney injury/failure, need for further procedure/surgery, arterial injury or dissection, embolization to new territory, intracranial hemorrhage (10-15% risk), neurologic deterioration, cardiopulmonary collapse, death. All questions were addressed. Maximal Sterile Barrier Technique was utilized including during the procedure including caps, mask, sterile gowns, sterile gloves, sterile drape, hand hygiene and skin antiseptic. A timeout was performed prior to the initiation of the procedure. The anesthesia team was present to provide general endotracheal tube anesthesia and for patient monitoring during the procedure. Interventional neuro radiology nursing staff was also present. FINDINGS: Initial Findings: Left subclavian artery: Normal course caliber and contour. Left internal mammary artery is patent. Costo cervical trunk and thyrocervical trunk are patent. Left vertebral artery: No significant atherosclerotic changes at the origin of the left vertebral artery. Tortuous course of the cervical vertebral artery without significant stenosis. Normal course caliber and contour of the intradural vertebral artery with no significant stenosis or atherosclerotic plaque. Basilar artery: Normal course caliber and contour. Patent right posteroinferior cerebellar artery. There appears to be a left-sided PICA/AICA variant Reflux of the distal right vertebral artery at the junction. Bilateral anterior inferior cerebellar arteries are patent. Bilateral superior cerebellar arteries are patent. Right PCA: Occluded just after the origin Left PCA: Normal course caliber and contour. Completion Findings: Right PCA: Complete restoration of flow after first  pass, TICI 3. Baseline PCA flow: TICI 0 Final PCA flow: TICI 3 PROCEDURE: The anesthesia team was present to provide general endotracheal tube anesthesia and for patient monitoring during the procedure. Intubation was performed in negative pressure Bay in neuro IR holding. Interventional neuro radiology nursing staff was also present. Ultrasound survey of the right inguinal region was performed with images stored and sent to PACs. 11 blade scalpel was used to make a small incision. Blunt dissection was performed with US guidance. A micropuncture needle was used access the right common femoral artery under ultrasound. With excellent arterial blood flow returned, an .018 micro wire was passed through the needle, observed to enter the abdominal aorta under fluoroscopy. The needle was removed, and a micropuncture sheath was placed over the wire. The inner dilator and wire were removed, and an 035 wire was advanced under fluoroscopy into the abdominal aorta. The sheath  was removed and a 25cm 57F straight vascular sheath was placed. The dilator was removed and the sheath was flushed. Sheath was attached to pressurized and heparinized saline bag for constant forward flow. A coaxial system was then advanced over the 035 wire. This included a 90cm neuron Max with coaxial 125cm Berenstein diagnostic catheter. This was advanced to the proximal descending thoracic aorta. Wire was then removed. Double flush of the catheter was performed. Catheter was then used to select the left subclavian artery. Angiogram was performed. Using roadmap technique, the catheter was advanced over a standard glide wire into left vertebral, with distal position achieved of the diagnostic catheter. The Neuron max catheter was then attempted to advance into the vertebral artery, meeting resistance just after the origin at the first tortuous turn. The Neuron Max was withdrawn just to the origin, and the diagnostic catheter and the glide wire were  removed. Formal angiogram was performed. Road map function was used once the occluded vessel was identified. Copious back flush was performed and the balloon catheter was attached to heparinized and pressurized saline bag for forward flow. A second coaxial system was then advanced through the balloon catheter, which included the selected intermediate catheter, microcatheter, and microwire. In this scenario, the set up included a 135cm CAT 5 catheter, a 160cm 021 lumen microcatheter, and 014 synchro soft wire. This system was advanced through the NeuronMax catheter under the road-map function, with adequate back-flush at the rotating hemostatic valve at that back end of the balloon guide. Upon the initial navigation of this second coaxial system into the proximal vertebral artery, the resistance of the system prolapsed the Neuron max into the aortic arch. This was not recoverable. Microcatheter intermediate catheter were removed and the 125 cm Berenstein catheter was advanced to the neuron max. We then selected the vertebral artery again with a proximal position of the neuron max. Berenstein catheter and the wire were removed. Angiogram was repeated. A second attempt was used then to navigate the CT 5 intermediate catheter and the microcatheter into the vertebral artery, which again prolapsed the base catheter. With the inability to get a more distal position given the tortuosity of the proximal vertebral artery segment, we withdrew the neuron max and abandoned this for a 6 French 95 cm benchmark catheter. The benchmark catheter was navigated with the mated diagnostic catheter into the left subclavian artery and the left vertebral artery. We were able to successfully navigate the combination into the mid segment of the cervical vertebral artery with the benchmark catheter achieving a more distal position. The Glidewire and the diagnostic catheter were withdrawn. Angiogram was performed. Microcatheter was then  advanced into the basilar artery, and to the right p1 origin to the level of the occlusion. The synchro soft wire met resistance upon the initial passage through the occlusion, and so was withdrawn. We then used a 016 J shape headliner wire to navigate through the occlusion with the microcatheter following into the P3 segment of the PCA. Wire was gently removed with saline drip at the hub. Blood was then aspirated through the hub of the microcatheter, and a gentle contrast injection was performed confirming intraluminal position. A rotating hemostatic valve was then attached to the back end of the microcatheter, and a pressurized and heparinized saline bag was attached to the catheter. 3 x 20 solitaire device was then selected. Back flush was achieved at the rotating hemostatic valve, and then the device was gently advanced through the microcatheter to the distal end.  The retriever was then unsheathed by withdrawing the microcatheter under fluoroscopy. Control angiogram was performed from the intermediate catheter. A 3 minute time interval was observed. We then withdrew the microcatheter and the solitaire under fluoroscopic observation, wall aspirating at the hub of the benchmark catheter under hand aspiration with 2 x 60 cc syringes on a 3 way stopcock. Once the solitaire was withdrawn to the Touhy the 2 was removed for the back end of the benchmark allowing back flow through the benchmark catheter. We aspirated at the hub and discarded the aspirate. Repeat angiogram was then performed. Restoration of flow was confirmed. Angiogram of the cervical vertebral artery was performed. The skin at the puncture site was then cleaned with Chlorhexidine. The 8 French sheath was removed and an 54F angioseal was deployed. Flat panel CT was performed. Given the findings of a questionable interpeduncular hemorrhage, the patient remained intubated. Patient tolerated the procedure well and remained hemodynamically stable throughout.  Blood loss was estimated 50 cc IMPRESSION: Status post ultrasound guided access right common femoral artery for cervical and cerebral angiogram and mechanical thrombectomy of right posterior cerebral artery for treatment of posterior circulation ELVO, with TICI 3 flow at the conclusion. Signed, Dulcy Fanny. Dellia Nims, RPVI Vascular and Interventional Radiology Specialists [facility] Radiology PLAN: The patient will remain intubated, as small volume subarachnoid hemorrhage was confirmed with formal CT ICU status Target systolic blood pressure of 120-140 Right hip straight time 6 hours Frequent neurovascular checks Repeat neurologic imaging with CT and/MRI at the discretion of neurology team Electronically Signed   By: Corrie Mckusick MelissaO.   On: 11/14/2019 02:58   IR US Guide Vasc Access Right  Result Date: 11/14/2019 INDICATION: 86 year old female presents for mechanical thrombectomy with emergent large vessel occlusion involving the right posterior cerebral artery EXAM: ULTRASOUND-GUIDED ACCESS RIGHT COMMON FEMORAL ARTERY CERVICAL AND CEREBRAL ANGIOGRAM MECHANICAL THROMBECTOMY RIGHT PCA ANGIO-SEAL FOR HEMOSTASIS COMPARISON:  CT imaging same day MEDICATIONS: None ANESTHESIA/SEDATION: The anesthesia team was present to provide general endotracheal tube anesthesia and for patient monitoring during the procedure. Intubation was performed in negative pressure Bay in neuro IR holding. Left radial arterial line was performed by the anesthesia team. Interventional neuro radiology nursing staff was also present. CONTRAST:  100 cc contrast FLUOROSCOPY TIME:  Fluoroscopy Time: 26 minutes 18 seconds (1961 mGy). COMPLICATIONS: SIR Level A - No therapy, no consequence. TECHNIQUE: Informed written consent was obtained from the patient's family after a thorough discussion of the procedural risks, benefits and alternatives. Specific risks discussed include: Bleeding, infection, contrast reaction, kidney injury/failure, need for  further procedure/surgery, arterial injury or dissection, embolization to new territory, intracranial hemorrhage (10-15% risk), neurologic deterioration, cardiopulmonary collapse, death. All questions were addressed. Maximal Sterile Barrier Technique was utilized including during the procedure including caps, mask, sterile gowns, sterile gloves, sterile drape, hand hygiene and skin antiseptic. A timeout was performed prior to the initiation of the procedure. The anesthesia team was present to provide general endotracheal tube anesthesia and for patient monitoring during the procedure. Interventional neuro radiology nursing staff was also present. FINDINGS: Initial Findings: Left subclavian artery: Normal course caliber and contour. Left internal mammary artery is patent. Costo cervical trunk and thyrocervical trunk are patent. Left vertebral artery: No significant atherosclerotic changes at the origin of the left vertebral artery. Tortuous course of the cervical vertebral artery without significant stenosis. Normal course caliber and contour of the intradural vertebral artery with no significant stenosis or atherosclerotic plaque. Basilar artery: Normal course caliber and contour. Patent  right posteroinferior cerebellar artery. There appears to be a left-sided PICA/AICA variant Reflux of the distal right vertebral artery at the junction. Bilateral anterior inferior cerebellar arteries are patent. Bilateral superior cerebellar arteries are patent. Right PCA: Occluded just after the origin Left PCA: Normal course caliber and contour. Completion Findings: Right PCA: Complete restoration of flow after first pass, TICI 3. Baseline PCA flow: TICI 0 Final PCA flow: TICI 3 PROCEDURE: The anesthesia team was present to provide general endotracheal tube anesthesia and for patient monitoring during the procedure. Intubation was performed in negative pressure Bay in neuro IR holding. Interventional neuro radiology nursing staff  was also present. Ultrasound survey of the right inguinal region was performed with images stored and sent to PACs. 11 blade scalpel was used to make a small incision. Blunt dissection was performed with US guidance. A micropuncture needle was used access the right common femoral artery under ultrasound. With excellent arterial blood flow returned, an .018 micro wire was passed through the needle, observed to enter the abdominal aorta under fluoroscopy. The needle was removed, and a micropuncture sheath was placed over the wire. The inner dilator and wire were removed, and an 035 wire was advanced under fluoroscopy into the abdominal aorta. The sheath was removed and a 25cm 43F straight vascular sheath was placed. The dilator was removed and the sheath was flushed. Sheath was attached to pressurized and heparinized saline bag for constant forward flow. A coaxial system was then advanced over the 035 wire. This included a 90cm neuron Max with coaxial 125cm Berenstein diagnostic catheter. This was advanced to the proximal descending thoracic aorta. Wire was then removed. Double flush of the catheter was performed. Catheter was then used to select the left subclavian artery. Angiogram was performed. Using roadmap technique, the catheter was advanced over a standard glide wire into left vertebral, with distal position achieved of the diagnostic catheter. The Neuron max catheter was then attempted to advance into the vertebral artery, meeting resistance just after the origin at the first tortuous turn. The Neuron Max was withdrawn just to the origin, and the diagnostic catheter and the glide wire were removed. Formal angiogram was performed. Road map function was used once the occluded vessel was identified. Copious back flush was performed and the balloon catheter was attached to heparinized and pressurized saline bag for forward flow. A second coaxial system was then advanced through the balloon catheter, which included  the selected intermediate catheter, microcatheter, and microwire. In this scenario, the set up included a 135cm CAT 5 catheter, a 160cm 021 lumen microcatheter, and 014 synchro soft wire. This system was advanced through the NeuronMax catheter under the road-map function, with adequate back-flush at the rotating hemostatic valve at that back end of the balloon guide. Upon the initial navigation of this second coaxial system into the proximal vertebral artery, the resistance of the system prolapsed the Neuron max into the aortic arch. This was not recoverable. Microcatheter intermediate catheter were removed and the 125 cm Berenstein catheter was advanced to the neuron max. We then selected the vertebral artery again with a proximal position of the neuron max. Berenstein catheter and the wire were removed. Angiogram was repeated. A second attempt was used then to navigate the CT 5 intermediate catheter and the microcatheter into the vertebral artery, which again prolapsed the base catheter. With the inability to get a more distal position given the tortuosity of the proximal vertebral artery segment, we withdrew the neuron max and abandoned this for  a 6 French 95 cm benchmark catheter. The benchmark catheter was navigated with the mated diagnostic catheter into the left subclavian artery and the left vertebral artery. We were able to successfully navigate the combination into the mid segment of the cervical vertebral artery with the benchmark catheter achieving a more distal position. The Glidewire and the diagnostic catheter were withdrawn. Angiogram was performed. Microcatheter was then advanced into the basilar artery, and to the right p1 origin to the level of the occlusion. The synchro soft wire met resistance upon the initial passage through the occlusion, and so was withdrawn. We then used a 016 J shape headliner wire to navigate through the occlusion with the microcatheter following into the P3 segment of the  PCA. Wire was gently removed with saline drip at the hub. Blood was then aspirated through the hub of the microcatheter, and a gentle contrast injection was performed confirming intraluminal position. A rotating hemostatic valve was then attached to the back end of the microcatheter, and a pressurized and heparinized saline bag was attached to the catheter. 3 x 20 solitaire device was then selected. Back flush was achieved at the rotating hemostatic valve, and then the device was gently advanced through the microcatheter to the distal end. The retriever was then unsheathed by withdrawing the microcatheter under fluoroscopy. Control angiogram was performed from the intermediate catheter. A 3 minute time interval was observed. We then withdrew the microcatheter and the solitaire under fluoroscopic observation, wall aspirating at the hub of the benchmark catheter under hand aspiration with 2 x 60 cc syringes on a 3 way stopcock. Once the solitaire was withdrawn to the Touhy the 2 was removed for the back end of the benchmark allowing back flow through the benchmark catheter. We aspirated at the hub and discarded the aspirate. Repeat angiogram was then performed. Restoration of flow was confirmed. Angiogram of the cervical vertebral artery was performed. The skin at the puncture site was then cleaned with Chlorhexidine. The 8 French sheath was removed and an 34F angioseal was deployed. Flat panel CT was performed. Given the findings of a questionable interpeduncular hemorrhage, the patient remained intubated. Patient tolerated the procedure well and remained hemodynamically stable throughout. Blood loss was estimated 50 cc IMPRESSION: Status post ultrasound guided access right common femoral artery for cervical and cerebral angiogram and mechanical thrombectomy of right posterior cerebral artery for treatment of posterior circulation ELVO, with TICI 3 flow at the conclusion. Signed, Dulcy Fanny. Dellia Nims, RPVI Vascular and  Interventional Radiology Specialists [facility] Radiology PLAN: The patient will remain intubated, as small volume subarachnoid hemorrhage was confirmed with formal CT ICU status Target systolic blood pressure of 120-140 Right hip straight time 6 hours Frequent neurovascular checks Repeat neurologic imaging with CT and/MRI at the discretion of neurology team Electronically Signed   By: Corrie Mckusick MelissaO.   On: 11/14/2019 02:58   DG CHEST PORT 1 VIEW  Result Date: 11/14/2019 CLINICAL DATA:  Endotracheal tube placement EXAM: PORTABLE CHEST 1 VIEW COMPARISON:  10/08/2010 FINDINGS: The endotracheal tube is been placed with tip measuring 4.7 cm above the carina. Cardiac enlargement. Small bilateral pleural effusions. Peripheral interstitial changes with evidence of bronchiectasis, likely chronic fibrosis. Edema or pneumonia would be less likely considerations. No pneumothorax. Mediastinal contours appear intact. Calcification of the aorta. IMPRESSION: Endotracheal tube placed with tip measuring 4.7 cm above the carina. Cardiac enlargement with small bilateral pleural effusions. Probable chronic interstitial changes. Electronically Signed   By: Oren Beckmann.D.  On: 11/14/2019 03:59   VAS [place] GROIN PSEUDOANEURYSM  Result Date: 11/17/2019  ARTERIAL PSEUDOANEURYSM  Exam: follow up thrombin injection Comparison Study: previous 11/16/19 Performing Technologist: Abram Sander RVS  Examination Guidelines: A complete evaluation includes B-mode imaging, spectral Doppler, color Doppler, and power Doppler as needed of all accessible portions of each vessel. Bilateral testing is considered an integral part of a complete examination. Limited examinations for reoccurring indications may be performed as noted. +------------+----------+---------+------+----------+ Right DuplexPSV (cm/s)Waveform PlaqueComment(s) +------------+----------+---------+------+----------+ CFA            104    triphasic                  +------------+----------+---------+------+----------+ Prox SFA       141    triphasic                 +------------+----------+---------+------+----------+  Summary: No evidence of pseudoaneurysm, AVF or DVT    --------------------------------------------------------------------------------    Preliminary    VAS [place] GROIN PSEUDOANEURYSM  Result Date: 11/16/2019  ARTERIAL PSEUDOANEURYSM  Exam: Right groin Indications: Patient complains of groin pain. History: S/p catheterization. Comparison Study: No prior studies. Performing Technologist: Darlin Coco, RDMS  Examination Guidelines: A complete evaluation includes B-mode imaging, spectral Doppler, color Doppler, and power Doppler as needed of all accessible portions of each vessel. Bilateral testing is considered an integral part of a complete examination. Limited examinations for reoccurring indications may be performed as noted. +------------+----------+---------+------+----------+ Right DuplexPSV (cm/s)Waveform PlaqueComment(s) +------------+----------+---------+------+----------+ CFA            130    triphasic                 +------------+----------+---------+------+----------+ PFA             63    biphasic                  +------------+----------+---------+------+----------+ Prox SFA        80    triphasic                 +------------+----------+---------+------+----------+ Right Vein comments:Patent with normal flow  Findings: An area with well defined borders measuring 2.0 cm x 2.4 cm was visualized arising off of the CFA distal with ultrasound characteristics of a pseudoaneurysm. The neck measures approximately 0.3 cm wide and 0.9 cm long. A mixed echogenic structure is visualized at the medial to CFV and CFA with ultrasound characteristics of a hematoma.  Diagnosing physician: Servando Snare MD Electronically signed by Servando Snare MD on 11/16/2019 at 1:09:11 PM.    --------------------------------------------------------------------------------    Final    ECHOCARDIOGRAM COMPLETE  Result Date: 11/14/2019    ECHOCARDIOGRAM REPORT   Patient Name:   LABERTA WILBON Date of Exam: 11/14/2019 Medical Rec #:  [id number]     Height:       61.0 in Accession #:    [id number]    Weight:       146.4 lb Date of Birth:  [birth year]     BSA:          1.654 m Patient Age:    33 years      BP:           163/73 mmHg Patient Gender: F             HR:           78 bpm. Exam Location:  Inpatient Procedure: 2D Echo, Cardiac Doppler, Color Doppler and Intracardiac  Opacification Agent Indications:    Stroke  History:        Patient has prior history of Echocardiogram examinations, most                 recent 05/31/2012. Arrythmias:Atrial Flutter, Atrial Fibrillation                 and SVT, Signs/Symptoms:Chest Pain; Risk Factors:Former Smoker.  Sonographer:    Clayton Lefort RDCS (AE) Referring Phys: [id number] Lorenza Chick  Sonographer Comments: Suboptimal apical window. IMPRESSIONS  1. Left ventricular ejection fraction, by estimation, is 60 to 65%. The left ventricle has normal function. The left ventricle has no regional wall motion abnormalities. There is severe asymmetric left ventricular hypertrophy of the basal-septal segment. Left ventricular diastolic function could not be evaluated.  2. Right ventricular systolic function is normal. The right ventricular size is normal. There is mildly elevated pulmonary artery systolic pressure.  3. Left atrial size was moderately dilated.  4. A small pericardial effusion is present. The pericardial effusion is circumferential.  5. The mitral valve is normal in structure. Trivial mitral valve regurgitation. No evidence of mitral stenosis. Moderate mitral annular calcification.  6. The aortic valve is tricuspid. There is mild calcification of the aortic valve. There is mild thickening of the aortic valve. Aortic valve regurgitation is trivial. Mild  aortic valve sclerosis is present, with no evidence of aortic valve stenosis.  7. The inferior vena cava is normal in size with greater than 50% respiratory variability, suggesting right atrial pressure of 3 mmHg. Conclusion(s)/Recommendation(s): No intracardiac source of embolism detected on this transthoracic study. A transesophageal echocardiogram is recommended to exclude cardiac source of embolism if clinically indicated. Basal septal hypertrophy of LV that is severe, otherwise diffuse moderate LVH. No focal wall motion abnormalities. FINDINGS  Left Ventricle: Left ventricular ejection fraction, by estimation, is 60 to 65%. The left ventricle has normal function. The left ventricle has no regional wall motion abnormalities. Definity contrast agent was given IV to delineate the left ventricular  endocardial borders. The left ventricular internal cavity size was normal in size. There is severe asymmetric left ventricular hypertrophy of the basal-septal segment. Left ventricular diastolic function could not be evaluated due to atrial fibrillation. Left ventricular diastolic function could not be evaluated. Right Ventricle: The right ventricular size is normal. Right vetricular wall thickness was not well visualized. Right ventricular systolic function is normal. There is mildly elevated pulmonary artery systolic pressure. The tricuspid regurgitant velocity  is 2.93 m/s, and with an assumed right atrial pressure of 3 mmHg, the estimated right ventricular systolic pressure is 26.4 mmHg. Left Atrium: Left atrial size was moderately dilated. Right Atrium: Right atrial size was normal in size. Pericardium: A small pericardial effusion is present. The pericardial effusion is circumferential. Mitral Valve: The mitral valve is normal in structure. There is mild thickening of the mitral valve leaflet(s). There is mild calcification of the mitral valve leaflet(s). Moderate mitral annular calcification. Trivial mitral valve  regurgitation. No evidence of mitral valve stenosis. Tricuspid Valve: The tricuspid valve is normal in structure. Tricuspid valve regurgitation is trivial. No evidence of tricuspid stenosis. Aortic Valve: The aortic valve is tricuspid. There is mild calcification of the aortic valve. There is mild thickening of the aortic valve. Aortic valve regurgitation is trivial. Mild aortic valve sclerosis is present, with no evidence of aortic valve stenosis. Aortic valve mean gradient measures 3.0 mmHg. Aortic valve peak gradient measures 6.2 mmHg. Aortic valve area, by VTI  measures 2.69 cm. Pulmonic Valve: The pulmonic valve was grossly normal. Pulmonic valve regurgitation is not visualized. Aorta: The aortic root and ascending aorta are structurally normal, with no evidence of dilitation. Venous: The inferior vena cava is normal in size with greater than 50% respiratory variability, suggesting right atrial pressure of 3 mmHg. IAS/Shunts: The atrial septum is grossly normal. Additional Comments: There is a small pleural effusion in both left and right lateral regions.  LEFT VENTRICLE PLAX 2D LVIDd:         3.10 cm LVIDs:         2.30 cm LV PW:         1.40 cm LV IVS:        1.90 cm LVOT diam:     2.00 cm LV SV:         59 LV SV Index:   36 LVOT Area:     3.14 cm  RIGHT VENTRICLE            IVC RV Basal diam:  2.50 cm    IVC diam: 1.90 cm RV S prime:     7.94 cm/s TAPSE (M-mode): 2.0 cm LEFT ATRIUM             Index       RIGHT ATRIUM           Index LA diam:        3.90 cm 2.36 cm/m  RA Area:     13.80 cm LA Vol (A2C):   94.6 ml 57.19 ml/m RA Volume:   26.70 ml  16.14 ml/m LA Vol (A4C):   54.4 ml 32.89 ml/m LA Biplane Vol: 72.7 ml 43.95 ml/m  AORTIC VALVE AV Area (Vmax):    2.60 cm AV Area (Vmean):   2.61 cm AV Area (VTI):     2.69 cm AV Vmax:           124.00 cm/s AV Vmean:          80.675 cm/s AV VTI:            0.220 m AV Peak Grad:      6.2 mmHg AV Mean Grad:      3.0 mmHg LVOT Vmax:         102.57 cm/s LVOT  Vmean:        67.025 cm/s LVOT VTI:          0.189 m LVOT/AV VTI ratio: 0.86  AORTA Ao Root diam: 3.20 cm Ao Asc diam:  3.00 cm TRICUSPID VALVE TR Peak grad:   34.3 mmHg TR Vmax:        293.00 cm/s  SHUNTS Systemic VTI:  0.19 m Systemic Diam: 2.00 cm Buford Dresser MD Electronically signed by Buford Dresser MD Signature Date/Time: 11/14/2019/9:17:16 PM    Final    IR PERCUTANEOUS ART THROMBECTOMY/INFUSION INTRACRANIAL INC DIAG ANGIO  Result Date: 11/14/2019 INDICATION: 86 year old female presents for mechanical thrombectomy with emergent large vessel occlusion involving the right posterior cerebral artery EXAM: ULTRASOUND-GUIDED ACCESS RIGHT COMMON FEMORAL ARTERY CERVICAL AND CEREBRAL ANGIOGRAM MECHANICAL THROMBECTOMY RIGHT PCA ANGIO-SEAL FOR HEMOSTASIS COMPARISON:  CT imaging same day MEDICATIONS: None ANESTHESIA/SEDATION: The anesthesia team was present to provide general endotracheal tube anesthesia and for patient monitoring during the procedure. Intubation was performed in negative pressure [place] in neuro IR holding. Left radial arterial line was performed by the anesthesia team. Interventional neuro radiology nursing staff was also present. CONTRAST:  100 cc contrast FLUOROSCOPY TIME:  Fluoroscopy Time: 26 minutes  18 seconds (1961 mGy). COMPLICATIONS: SIR Level A - No therapy, no consequence. TECHNIQUE: Informed written consent was obtained from the patient's family after a thorough discussion of the procedural risks, benefits and alternatives. Specific risks discussed include: Bleeding, infection, contrast reaction, kidney injury/failure, need for further procedure/surgery, arterial injury or dissection, embolization to new territory, intracranial hemorrhage (10-15% risk), neurologic deterioration, cardiopulmonary collapse, death. All questions were addressed. Maximal Sterile Barrier Technique was utilized including during the procedure including caps, mask, sterile gowns, sterile gloves,  sterile drape, hand hygiene and skin antiseptic. A timeout was performed prior to the initiation of the procedure. The anesthesia team was present to provide general endotracheal tube anesthesia and for patient monitoring during the procedure. Interventional neuro radiology nursing staff was also present. FINDINGS: Initial Findings: Left subclavian artery: Normal course caliber and contour. Left internal mammary artery is patent. Costo cervical trunk and thyrocervical trunk are patent. Left vertebral artery: No significant atherosclerotic changes at the origin of the left vertebral artery. Tortuous course of the cervical vertebral artery without significant stenosis. Normal course caliber and contour of the intradural vertebral artery with no significant stenosis or atherosclerotic plaque. Basilar artery: Normal course caliber and contour. Patent right posteroinferior cerebellar artery. There appears to be a left-sided PICA/AICA variant Reflux of the distal right vertebral artery at the junction. Bilateral anterior inferior cerebellar arteries are patent. Bilateral superior cerebellar arteries are patent. Right PCA: Occluded just after the origin Left PCA: Normal course caliber and contour. Completion Findings: Right PCA: Complete restoration of flow after first pass, TICI 3. Baseline PCA flow: TICI 0 Final PCA flow: TICI 3 PROCEDURE: The anesthesia team was present to provide general endotracheal tube anesthesia and for patient monitoring during the procedure. Intubation was performed in negative pressure [place] in neuro IR holding. Interventional neuro radiology nursing staff was also present. Ultrasound survey of the right inguinal region was performed with images stored and sent to PACs. 11 blade scalpel was used to make a small incision. Blunt dissection was performed with US guidance. A micropuncture needle was used access the right common femoral artery under ultrasound. With excellent arterial blood flow  returned, an .018 micro wire was passed through the needle, observed to enter the abdominal aorta under fluoroscopy. The needle was removed, and a micropuncture sheath was placed over the wire. The inner dilator and wire were removed, and an 035 wire was advanced under fluoroscopy into the abdominal aorta. The sheath was removed and a 25cm 22F straight vascular sheath was placed. The dilator was removed and the sheath was flushed. Sheath was attached to pressurized and heparinized saline bag for constant forward flow. A coaxial system was then advanced over the 035 wire. This included a 90cm neuron Max with coaxial 125cm Berenstein diagnostic catheter. This was advanced to the proximal descending thoracic aorta. Wire was then removed. Double flush of the catheter was performed. Catheter was then used to select the left subclavian artery. Angiogram was performed. Using roadmap technique, the catheter was advanced over a standard glide wire into left vertebral, with distal position achieved of the diagnostic catheter. The Neuron max catheter was then attempted to advance into the vertebral artery, meeting resistance just after the origin at the first tortuous turn. The Neuron Max was withdrawn just to the origin, and the diagnostic catheter and the glide wire were removed. Formal angiogram was performed. Road map function was used once the occluded vessel was identified. Copious back flush was performed and the balloon catheter was attached  to heparinized and pressurized saline bag for forward flow. A second coaxial system was then advanced through the balloon catheter, which included the selected intermediate catheter, microcatheter, and microwire. In this scenario, the set up included a 135cm CAT 5 catheter, a 160cm 021 lumen microcatheter, and 014 synchro soft wire. This system was advanced through the NeuronMax catheter under the road-map function, with adequate back-flush at the rotating hemostatic valve at that  back end of the balloon guide. Upon the initial navigation of this second coaxial system into the proximal vertebral artery, the resistance of the system prolapsed the Neuron max into the aortic arch. This was not recoverable. Microcatheter intermediate catheter were removed and the 125 cm Berenstein catheter was advanced to the neuron max. We then selected the vertebral artery again with a proximal position of the neuron max. Berenstein catheter and the wire were removed. Angiogram was repeated. A second attempt was used then to navigate the CT 5 intermediate catheter and the microcatheter into the vertebral artery, which again prolapsed the base catheter. With the inability to get a more distal position given the tortuosity of the proximal vertebral artery segment, we withdrew the neuron max and abandoned this for a 6 French 95 cm benchmark catheter. The benchmark catheter was navigated with the mated diagnostic catheter into the left subclavian artery and the left vertebral artery. We were able to successfully navigate the combination into the mid segment of the cervical vertebral artery with the benchmark catheter achieving a more distal position. The Glidewire and the diagnostic catheter were withdrawn. Angiogram was performed. Microcatheter was then advanced into the basilar artery, and to the right p1 origin to the level of the occlusion. The synchro soft wire met resistance upon the initial passage through the occlusion, and so was withdrawn. We then used a 016 J shape headliner wire to navigate through the occlusion with the microcatheter following into the P3 segment of the PCA. Wire was gently removed with saline drip at the hub. Blood was then aspirated through the hub of the microcatheter, and a gentle contrast injection was performed confirming intraluminal position. A rotating hemostatic valve was then attached to the back end of the microcatheter, and a pressurized and heparinized saline bag was  attached to the catheter. 3 x 20 solitaire device was then selected. Back flush was achieved at the rotating hemostatic valve, and then the device was gently advanced through the microcatheter to the distal end. The retriever was then unsheathed by withdrawing the microcatheter under fluoroscopy. Control angiogram was performed from the intermediate catheter. A 3 minute time interval was observed. We then withdrew the microcatheter and the solitaire under fluoroscopic observation, wall aspirating at the hub of the benchmark catheter under hand aspiration with 2 x 60 cc syringes on a 3 way stopcock. Once the solitaire was withdrawn to the Touhy the 2 was removed for the back end of the benchmark allowing back flow through the benchmark catheter. We aspirated at the hub and discarded the aspirate. Repeat angiogram was then performed. Restoration of flow was confirmed. Angiogram of the cervical vertebral artery was performed. The skin at the puncture site was then cleaned with Chlorhexidine. The 8 French sheath was removed and an 75F angioseal was deployed. Flat panel CT was performed. Given the findings of a questionable interpeduncular hemorrhage, the patient remained intubated. Patient tolerated the procedure well and remained hemodynamically stable throughout. Blood loss was estimated 50 cc IMPRESSION: Status post ultrasound guided access right common femoral artery for  cervical and cerebral angiogram and mechanical thrombectomy of right posterior cerebral artery for treatment of posterior circulation ELVO, with TICI 3 flow at the conclusion. Signed, Dulcy Fanny. Dellia Nims, RPVI [facility] Radiology PLAN: The patient will remain intubated, as small volume subarachnoid hemorrhage was confirmed with formal CT ICU status Target systolic blood pressure of 120-140 Right hip straight time 6 hours Frequent neurovascular checks Repeat neurologic imaging with CT and/MRI at the  discretion of neurology team Electronically Signed   By: Corrie Mckusick MelissaO.   On: 11/14/2019 02:58   CT HEAD CODE STROKE WO CONTRAST  Result Date: 11/13/2019 CLINICAL DATA:  Code stroke. 86 year old female with rightward gaze and left side weakness. EXAM: CT HEAD WITHOUT CONTRAST TECHNIQUE: Contiguous axial images were obtained from the base of the skull through the vertex without intravenous contrast. COMPARISON:  Head CT 02/04/2005. FINDINGS: Brain: No acute intracranial hemorrhage identified. No midline shift, mass effect, or evidence of intracranial mass lesion. Chronic cerebral volume loss. Ex vacuo appearing ventricular enlargement. Patchy bilateral cerebral white matter hypodensity. Age indeterminate patchy hypodensity in the thalami. Basal ganglia appear relatively spared. No cortically based acute infarct identified. No cortical encephalomalacia identified. Brainstem and cerebellum are within normal limits. Vascular: Calcified atherosclerosis at the skull base. No suspicious intracranial vascular hyperdensity. Skull: No acute osseous abnormality identified. Sinuses/Orbits: Mild new mastoid effusions. Paranasal sinuses remain well pneumatized. Other: No acute orbit or scalp soft tissue finding. Negative visible nasopharynx. ASPECTS [facility] Stroke Program Early CT Score) Total score (0-10 with 10 being normal): 10 IMPRESSION: 1. Age indeterminate small vessel disease including in the right thalamus, but no acute cortically based infarct or acute intracranial hemorrhage identified. ASPECTS 10. 2. These results were communicated to Dr. Curly Shores At 10:56 pm on 11/13/2019 by text page via the [facility] messaging system. And also discussed by telephone at that same time. Electronically Signed   By: Genevie Ann M.D.   On: 11/13/2019 22:57    Labs:  CBC: Recent Labs    11/13/19 2244 11/13/19 2248 11/14/19 0351 11/15/19 0408 11/16/19 1206 11/17/19 0220  WBC 6.3  --   --  7.6 7.3 9.2  HGB 12.8   < > 10.5*  9.8* 8.8* 9.1*  HCT 40.4   < > 31.0* 31.2* 27.0* 28.3*  PLT 343  --   --  261 267 283   < > = values in this interval not displayed.    COAGS: Recent Labs    11/13/19 2244  INR 1.0  APTT 31    BMP: Recent Labs    11/13/19 2244 11/13/19 2244 11/13/19 2248 11/13/19 2248 11/14/19 0351 11/15/19 0408 11/16/19 1206 11/17/19 0220  NA 137   < > 142   < > 144 140 138 139  K 3.9   < > 3.9   < > 3.1* 3.9 3.8 4.0  CL 103   < > 103  --   --  110 105 105  CO2 24  --   --   --   --  '22 27 27  ' GLUCOSE 107*   < > 102*  --   --  92 119* 117*  BUN 20   < > 20  --   --  7* 6* 5*  CALCIUM 8.3*  --   --   --   --  7.9* 7.9* 8.1*  CREATININE 0.83   < > 0.70  --   --  0.71 0.71 0.66  GFRNONAA >60  --   --   --   --  >  60 >60 >60   < > = values in this interval not displayed.    LIVER FUNCTION TESTS: Recent Labs    11/13/19 2244 11/15/19 0408  BILITOT 0.8 0.9  AST 63* 24  ALT 61* 32  ALKPHOS 46 37*  PROT 6.2* 5.1*  ALBUMIN 2.8* 2.4*    Assessment and Plan: History of acute CVA s/p cerebral arteriogram with emergent mechanical thrombectomy of right PCA occlusion achieving a TICI 3 revascularization via right femoral approach 11/14/2019 by Dr. Earleen Newport; s/p thrombin injection of rt CFA PSA yesterday; f/u US of area today shows no PSA, AVF or DVT; afebrile; hgb 9.1(8.8), creat nl; WBC nl; hydrate; encourage po intake; additional plans as per neurology   Electronically Signed: D. Rowe Robert, PA-C 11/17/2019, 12:39 PM   I spent a total of 15 minutes at the the patient's bedside AND on the patient's hospital floor or unit, greater than 50% of which was counseling/coordinating care for cerebral arteriogram with endovascular intervention/ thrombin injection right CFA pseudoanurysm    Patient ID: [name], female   DOB: May 01, 1931, 86 y.o.   MRN: [id number]

## 2019-11-17 NOTE — Progress Notes (Signed)
STROKE TEAM PROGRESS NOTE   INTERVAL HISTORY pt had right groin swelling and tenderness at puncture site, found to have groin hematoma. Had [place] yesterday showed pseudoaneurysm, Dr. Earleen Newport performed thrombin injection. Repeat ultrasound with resolution.  Daughter is at bedside, they request patient go home with physical therapy, she lives alone with her 84 year old husband but daughter states her and her siblings will be there 66 x 7 and they prefer that she go home with physical therapy.  Patient has not had a bowel movement, will work on a bowel regimen today and also get physical therapy to reevaluate since she is been not immobile since Wednesday due to the pseudoaneurysm, will follow as well today we will discharge at the end of the day.  OBJECTIVE Vitals:   11/17/19 0230 11/17/19 0350 11/17/19 0627 11/17/19 0717  BP: (!) 167/78 (!) 160/69 (!) 167/79 (!) 149/83  Pulse: 86 94 92 90  Resp: 18 18 18 18   Temp:  98.6 F (37 C)  98.8 F (37.1 C)  TempSrc:  Oral  Oral  SpO2: 94% 94% 98% 97%  Weight:      Height:       CBC:  Recent Labs  Lab 11/13/19 2244 11/13/19 2248 11/16/19 1206 11/17/19 0220  WBC 6.3   < > 7.3 9.2  NEUTROABS 3.8  --   --   --   HGB 12.8   < > 8.8* 9.1*  HCT 40.4   < > 27.0* 28.3*  MCV 100.0   < > 97.5 98.6  PLT 343   < > 267 283   < > = values in this interval not displayed.   Basic Metabolic Panel:  Recent Labs  Lab 11/15/19 0408 11/15/19 0408 11/16/19 1206 11/17/19 0220  NA 140   < > 138 139  K 3.9   < > 3.8 4.0  CL 110   < > 105 105  CO2 22   < > 27 27  GLUCOSE 92   < > 119* 117*  BUN 7*   < > 6* 5*  CREATININE 0.71   < > 0.71 0.66  CALCIUM 7.9*   < > 7.9* 8.1*  MG 1.9  --   --   --    < > = values in this interval not displayed.   Lipid Panel:     Component Value Date/Time   CHOL 70 11/14/2019 0408   TRIG 126 11/14/2019 0408   TRIG 127 11/14/2019 0408   HDL 19 (L) 11/14/2019 0408   CHOLHDL 3.7 11/14/2019 0408   VLDL 25 11/14/2019 0408    LDLCALC 26 11/14/2019 0408   HgbA1c:  Lab Results  Component Value Date   HGBA1C 5.7 (H) 11/14/2019   Urine Drug Screen: No results found for: LABOPIA, COCAINSCRNUR, LABBENZ, AMPHETMU, THCU, LABBARB  Alcohol Level     Component Value Date/Time   ETH <10 11/13/2019 2244    IMAGING  CT Code Stroke CTA Head W/WO contrast CT Code Stroke CTA Neck W/WO contrast 11/13/2019 IMPRESSION:  1. Positive for occlusion of the Right PCA origin.  2. No other large vessel occlusion, and generally mild for age atherosclerosis in the head and neck.  3. Emphysema (ICD10-J43.9) with layering left pleural effusion, patchy left upper lobe opacity compatible with pneumonia in this setting.  4. Aortic Atherosclerosis (ICD10-I70.0)   CT HEAD WO CONTRAST 11/14/2019 IMPRESSION:  1. Status post Right PCA endovascular revascularization with trace subarachnoid blood/contrast in the interpeduncular cistern.  2. Small 10  mm area of contrast staining versus petechial hemorrhage in the medial right thalamus. And question subtle increased right thalamic hypodensity elsewhere, but no other Right PCA territory infarct changes are identified.   MR BRAIN WO CONTRAST MR ANGIO HEAD WO CONTRAST 11/14/2019 IMPRESSION:  Acute infarct right thalamus with associated mild hemorrhage as noted on recent CT. There is also mild subarachnoid hemorrhage in the interpeduncular cistern unchanged from CT. Additional small areas of acute infarct in the right cerebellum and right frontal cortex. Moderate atrophy. Chronic microvascular ischemic change in the white matter. Right PCA patent post thrombectomy. There is a mild stenosis in the right PCA at the P1/ P2 junction.   [place] 11/14/2019 IMPRESSION:  Status post ultrasound guided access right common femoral artery for cervical and cerebral angiogram and mechanical thrombectomy of right posterior cerebral artery for treatment of posterior circulation ELVO, with TICI 3 flow  at the conclusion. PLAN: The patient will remain intubated, as small volume subarachnoid hemorrhage was confirmed with formal CT ICU status Target systolic blood pressure of 120-140 Right hip straight time 6 hours Frequent neurovascular checks Repeat neurologic imaging with CT and/MRI at the discretion of neurology team.  DG CHEST PORT 1 VIEW 11/14/2019 IMPRESSION: Endotracheal tube placed with tip measuring 4.7 cm above the carina. Cardiac enlargement with small bilateral pleural effusions. Probable chronic interstitial changes.   CT HEAD CODE STROKE WO CONTRAST 11/13/2019 IMPRESSION:  Age indeterminate small vessel disease including in the right thalamus, but no acute cortically based infarct or acute intracranial hemorrhage identified. ASPECTS 10.   Transthoracic Echocardiogram  11/14/2019 1. Left ventricular ejection fraction, by estimation, is 60 to 65%. The left ventricle has normal function. The left ventricle has no regional wall motion abnormalities. There is severe asymmetric left ventricular hypertrophy of the basal-septal segment. Left ventricular diastolic function could not be evaluated.  2. Right ventricular systolic function is normal. The right ventricular size is normal. There is mildly elevated pulmonary artery systolic pressure.  3. Left atrial size was moderately dilated.  4. A small pericardial effusion is present. The pericardial effusion is circumferential.  5. The mitral valve is normal in structure. Trivial mitral valve regurgitation. No evidence of mitral stenosis. Moderate mitral annular calcification.  6. The aortic valve is tricuspid. There is mild calcification of the aortic valve. There is mild thickening of the aortic valve. Aortic valve regurgitation is trivial. Mild aortic valve sclerosis is present, with no evidence of aortic valve stenosis.  7. The inferior vena cava is normal in size with greater than 50% respiratory variability, suggesting right  atrial pressure of 3 mmHg.   ECG - atrial fibrillation - ventricular response 87 BPM (See cardiology reading for complete details)   PHYSICAL EXAM   Temp:  [98.4 F (36.9 C)-99.1 F (37.3 C)] 98.8 F (37.1 C) (10/23 0717) Pulse Rate:  [85-94] 90 (10/23 0717) Resp:  [18-24] 18 (10/23 0717) BP: (119-167)/(62-83) 149/83 (10/23 0717) SpO2:  [91 %-100 %] 97 % (10/23 0717)  General - Well nourished, well developed, no acute distress.  Ophthalmologic - fundi not visualized due to noncooperation.  Cardiovascular - irregularly irregular heart rate and rhythm.  Mental Status -  Level of arousal and orientation to place, age, month and person were intact, however, not orientated to year. Language exam showed no aphasia, able to name and repeat, follow simple commands.    Cranial Nerves II - XII - II - Visual field intact OU. III, IV, VI - Extraocular movements intact.  V - Facial sensation intact bilaterally. VII - subtle left nasolabial fold flattening. VIII - Hearing & vestibular intact bilaterally. X - Palate elevates symmetrically. XI - Chin turning & shoulder shrug intact bilaterally. XII - Tongue protrusion intact.  Motor Strength - The patient's strength was normal in right upper and lower extremities, left upper extremity drift with mildly decreased finger grip, left LE 4/5 proximal and 4+/5 distally.  Bulk was normal and fasciculations were absent.   Motor Tone - Muscle tone was assessed at the neck and appendages and was normal.  Reflexes - The patient's reflexes were symmetrical in all extremities and she had no pathological reflexes.  Sensory - Light touch, temperature/pinprick were assessed and were symmetrical.    Coordination - The patient had normal movements in the hands with no ataxia or dysmetria.  Tremor was absent.  Gait and Station - deferred.   ASSESSMENT/PLAN [name] is a 86 y.o. female with history of Afib/flutter s/p ablation, off AC due to  persistent sinus rhythm, and a recent diagnosis of pneumonia (she was not improving on the first round of antibiotics tried and therefore was broadened to Augmentin on Monday), presenting with acute onset right gaze preference and left-sided weakness. The patient received IV t-PA Tuesday 11/19 at 10:59 PM and sent to IR for R PCA occlusion.  Stroke: R thalamic, cerebellar, and frontal small infarcts with right PCA occlusion s/p tPA and IR with TICI3 from AF not on AC Small SAH interpeduncular cistern post IR  CT Head - Age indeterminate small vessel disease including in the right thalamus, but no acute cortically based infarct or acute intracranial hemorrhage identified.   CTA H&N - Positive for occlusion of the Right PCA origin. No other large vessel occlusion, and generally mild for age atherosclerosis in the head and neck.   Cerebral angio/ IR - Rt PCA occlusion with TICI 3 flow   CT head - Status post Right PCA endovascular revascularization with trace subarachnoid blood/contrast in the interpeduncular cistern. Small 10 mm area of contrast staining versus petechial hemorrhage in the medial right thalamus.   MRI head - R thalamus infarct with associated mild hemorrhage as noted on recent CT. There is also mild subarachnoid hemorrhage in the interpeduncular cistern unchanged from CT. Additional small areas of acute infarct in the right cerebellum and right frontal cortex. Moderate atrophy   MRA head -  Right PCA patent post thrombectomy. There is a mild stenosis in the right PCA at the P1/ P2 junction.   2D Echo - EF 60-65%. No source of embolus. LA moderately dilated  [employer] Virus 2 - negative  LDL - 26  HgbA1c - 5.7  VTE prophylaxis - Heparin 5000 units sq tid   aspirin 81 mg daily prior to admission, now on aspirin 325 mg daily. Consider AC once SAH resolved (probably in 5-7 days post stroke)    Therapy recommendations:  HH OT, HH PT  Disposition:  Pending - anticipate d/c  home 10/23 if remains stable  R Groin Hematoma  Developed hospital D#2 at angio puncture site  Groin pseudoaneurysm [place] 2.0 x 2.4 R CFA pseudoaneurysm w/ hematoma  Thrombin injection 10/22 Earleen Newport)  Repeat US 10/23 - no evidence of pseudoaneurysm - preliminary  Acute Respiratory Failure, resolved  Intubated for IR, left intubated post IR   Sedated w/ fentanyl and propofol, now off  Extubated 10/20  Patient stable  CCM signed off   Atrial Fibrillation/Aflutter  Home anticoagulation:  none  post ablation Dr. Ashley Mariner 2004. Doing very well. No further recurrences.  No AC now given post tPA small SAH   Now on aspirin 325  Consider AC once SAH resolved (probably in 5-7 days post stroke)     Follows with Dr. Marlou Porch at [facility]  No response to intial abx  Broadened as OP to Augmention 10/18  Still has intermittent coughing  CTA - layering left pleural effusion, patchy left upper lobe opacity compatible with pneumonia  CXR 10/20 - small bilateral pleural effusions. Probable chronic interstitial changes.  WBCs 6.3->7.6->7.3 (afebrile)  Hypertension  Home BP meds: metoprolol   Treated short term w/ cleviprex, off this am  BP stable . BP goal < 160  . Long-term BP goal normotensive  Hyperlipidemia  Home Lipid lowering medication: Lipitor 10 mg daily  LDL 26, goal < 70  Will not resume lipitor due to low LDL level  Dysphagia, resolved . Secondary to stroke . Cleared for regular consistency, thin liquid diet . Off IVF  . Sore throat after extubation   Other Stroke Risk Factors  Advanced age  Former cigarette smoker - quit  ETOH use, advised to drink no more than 1 alcoholic beverage per day.  Other Active Problems  Code status - Full code  Emphysema ([id number])   Aortic Atherosclerosis (ICD10-I70.0)   Anemia - Hgb - 12.8->13.3-> 10.5->9.8->8.8->9.1  Hypokalemia - K 3.8 - resolved - 4.0   PT to  revaluate pt after prolonged bedrest.  Pt is 5 days out - will need to determine if anticoagulation is appropriate at this time.  Constipated - no recent BM - need to resolve issue prior to D/C. Will order Dulcolax suppository prn.  Hospital day # 4  Repeat ultrasound with resolution.  Daughter is at bedside, they request patient go home with physical therapy, she lives alone with her 46 year old husband but daughter states her and her siblings will be there 69 x 7 and they prefer that she go home with physical therapy.  Patient has not had a bowel movement, will work on a bowel regimen today and also get physical therapy to reevaluate since she is been not immobile since Wednesday due to the pseudoaneurysm, will follow as well today we will discharge at the end of the day.  Personally examined patient and images, and have participated in and made any corrections needed to history, physical, neuro exam,assessment and plan as stated above.  I have personally obtained the history, evaluated lab date, reviewed imaging studies and agree with radiology interpretations.    Sarina Ill, MD Stroke Neurology  I spent 25 minutes of face-to-face and non-face-to-face time with patient. This included prechart review, lab review, study review, order entry, electronic health record documentation, patient education on the different diagnostic and therapeutic options, counseling and coordination of care, risks and benefits of management, compliance, or risk factor reduction      To contact Stroke Continuity provider, please refer to [email or web address] After hours, contact General Neurology

## 2019-11-17 NOTE — Progress Notes (Signed)
Occupational Therapy Treatment Patient Details Name: [name] MRN: [id number] DOB: 1931-09-29 Today's Date: 11/17/2019    History of present illness 86 yo with acute right thalamic infarct s/p thrombectomy of Rt PCA 10/20 with revascularization, extubated 10/20. PMHx:Afib, SVT   OT comments  Pt making gradual progress towards OT goals this session. Pt continues to present with cognitive impairments in the areas of memory, safety/judgement, problem solving, attention, and  following multi step commands.Session focus on administration of pillbox assessment.  Pt failed assessment d/t pt making > 3 errors and exceeding time limit of >5 mins. Pt with no awareness into errors made during assessment. Of note pt was agreeable to potential CIR admission to progress pts balance, functional mobility, and increase pts activity tolerance and overall strength and endurance to return to pts prior level of function. Updated DC recs as indicated below and will let OTR know about change in POC. Will continue to follow pt acutely and update DC recs as needed.    Follow Up Recommendations  Home health OT;Supervision/Assistance - 24 hour;CIR    Equipment Recommendations  3 in 1 bedside commode    Recommendations for Other Services      Precautions / Restrictions Precautions Precautions: Fall Precaution Comments: watch sats Restrictions Weight Bearing Restrictions: No       Mobility Bed Mobility Overal bed mobility: Needs Assistance Bed Mobility: Supine to Sit         General bed mobility comments: pt required MOD +2 to repositon pt in bed; noted L lateral lean with pt in upright position in bed  Transfers Overall transfer level: Needs assistance         General transfer comment: session focus on cognitive tasks    Balance Overall balance assessment: Needs assistance                             ADL either performed or assessed with clinical judgement   ADL Overall  ADL's : Needs assistance/impaired                                       General ADL Comments: session focus on cognitive tasks related  to IADL tasks, administered the pillbox assesssment with pt failing assessment.     Vision       Perception     Praxis      Cognition Arousal/Alertness: Awake/alert Behavior During Therapy: WFL for tasks assessed/performed Overall Cognitive Status: Impaired/Different from baseline Area of Impairment: Memory;Safety/judgement;Problem solving;Attention;Following commands                   Current Attention Level: Sustained Memory: Decreased short-term memory Following Commands: Follows multi-step commands inconsistently Safety/Judgement: Decreased awareness of deficits   Problem Solving: Difficulty sequencing;Requires verbal cues;Slow processing General Comments: administered pill box assessment with pt taking > 40mins to complete task and finished tasks with > 3 errors. pt with no awareness to errors stating " well I do it different at home."        Exercises     Shoulder Instructions       General Comments daughter present and supportive, agreeable to recommendations for potential CIR admit    Pertinent Vitals/ Pain       Pain Assessment: No/denies pain Faces Pain Scale: Hurts whole lot Pain Location: R groin and hip.  Home Living  Prior Functioning/Environment              Frequency  Min 2X/week        Progress Toward Goals  OT Goals(current goals can now be found in the care plan section)  Progress towards OT goals: Progressing toward goals  Acute Rehab OT Goals Patient Stated Goal: return home  OT Goal Formulation: With patient Time For Goal Achievement: 11/29/19 Potential to Achieve Goals: Good  Plan Discharge plan needs to be updated;Frequency needs to be updated    Co-evaluation                 AM-PAC OT "6 Clicks"  Daily Activity     Outcome Measure   Help from another person eating meals?: A Little Help from another person taking care of personal grooming?: A Little Help from another person toileting, which includes using toliet, bedpan, or urinal?: A Lot Help from another person bathing (including washing, rinsing, drying)?: A Lot Help from another person to put on and taking off regular upper body clothing?: A Lot Help from another person to put on and taking off regular lower body clothing?: Total 6 Click Score: 13    End of Session    OT Visit Diagnosis: Other abnormalities of gait and mobility (R26.89);Muscle weakness (generalized) (M62.81);Other symptoms and signs involving cognitive function;Other symptoms and signs involving the nervous system (R29.898)   Activity Tolerance Patient tolerated treatment well   Patient Left in bed;with call bell/phone within reach;with bed alarm set;with family/visitor present   Nurse Communication          Time: 1610-9604 OT Time Calculation (min): 25 min  Charges: OT General Charges $OT Visit: 1 Visit OT Treatments $Self Care/Home Management : 23-37 mins  Lanier Clam., COTA/L Acute Rehabilitation Services [phone] [place] 11/17/2019, 2:36 PM

## 2019-11-17 NOTE — Progress Notes (Signed)
Post op pseudo check has been completed.   Preliminary results in CV Proc.   Abram Sander 11/17/2019 8:54 AM

## 2019-11-17 NOTE — Progress Notes (Signed)
ANTICOAGULATION CONSULT NOTE - Initial Consult  Pharmacy Consult for apixaban Indication: stroke, small SAH  Allergies  Allergen Reactions  . Bee Venom Anaphylaxis  . Mixed Vespid Venom Anaphylaxis  . Codeine Other (See Comments)    Not known  . Sulfa Antibiotics Itching and Nausea Only  . Erythromycin Nausea And Vomiting    Patient Measurements: Height: '5\' 1"'  (154.9 cm) Weight: 66.4 kg (146 lb 6.2 oz) IBW/kg (Calculated) : 47.8  Vital Signs: Temp: 98.5 F (36.9 C) (10/23 1213) Temp Source: Oral (10/23 1213) BP: 139/92 (10/23 1213) Pulse Rate: 92 (10/23 1213)  Labs: Recent Labs    11/15/19 0408 11/15/19 0408 11/16/19 1206 11/17/19 0220  HGB 9.8*   < > 8.8* 9.1*  HCT 31.2*  --  27.0* 28.3*  PLT 261  --  267 283  CREATININE 0.71  --  0.71 0.66   < > = values in this interval not displayed.    Estimated Creatinine Clearance: 44 mL/min (by C-G formula based on SCr of 0.66 mg/dL).   Medical History: Past Medical History:  Diagnosis Date  . Atrial fibrillation ([place])    Prior treatment with amiodarone, held sinus rhythm and drug stopped  . Atrial flutter ([place])    Status post ablation prior to  2009  . Chest pain    Nuclear, 2004, no scar or ischemia, EF 77%  . Ejection fraction    EF 70%, nuclear,  2004  //     . Fall    July, 2014  . H/O bladder repair surgery     Medications:  Scheduled:  .  stroke: mapping our early stages of recovery book   Does not apply Once  . amoxicillin-clavulanate  1 tablet Oral Q12H  . aspirin EC  325 mg Oral Daily  . chlorhexidine gluconate (MEDLINE KIT)  15 mL Mouth Rinse BID  . docusate sodium  100 mg Oral BID  . heparin injection (subcutaneous)  5,000 Units Subcutaneous Q8H  . pantoprazole  40 mg Oral Daily  . polyethylene glycol  17 g Oral Daily    Assessment: 86 yo admitted with R thalamic, cerebellar, and frontal small infarcts with right PCA occlusion s/p tPA and IR from hx of atrial fibrillation not on anticoag  PTA.  MRI also showed mild subarachnoid hemorrhage.  Plan to start [facility] ~5-7 days post stroke.  Pharmacy was consulted by neuro to start apixaban.   Per neuro, start apixaban 10/24 and discontinue aspirin at start of apixaban.  Patient meets criteria for 64m BID (>80 yo, > 60kg, Scr < 1.5).    Goal of Therapy:  Monitor platelets by anticoagulation protocol: Yes   Plan:   Apixaban 569mBID to start 10/24 Discontinue ASA at start of apixaban 10/24 Monitor CBC, signs/symptoms of bleeding  MaDimple NanasPharmD PGY-1 Acute Care Pharmacy Resident Office: [phone]/23/2021 1:10 PM

## 2019-11-18 DIAGNOSIS — I63531 Cerebral infarction due to unspecified occlusion or stenosis of right posterior cerebral artery: Secondary | ICD-10-CM | POA: Diagnosis not present

## 2019-11-18 LAB — CBC
HCT: 25.9 % — ABNORMAL LOW (ref 36.0–46.0)
Hemoglobin: 8.4 g/dL — ABNORMAL LOW (ref 12.0–15.0)
MCH: 31.6 pg (ref 26.0–34.0)
MCHC: 32.4 g/dL (ref 30.0–36.0)
MCV: 97.4 fL (ref 80.0–100.0)
Platelets: 287 10*3/uL (ref 150–400)
RBC: 2.66 MIL/uL — ABNORMAL LOW (ref 3.87–5.11)
RDW: 12.6 % (ref 11.5–15.5)
WBC: 10 10*3/uL (ref 4.0–10.5)
nRBC: 0.2 % (ref 0.0–0.2)

## 2019-11-18 LAB — BASIC METABOLIC PANEL
Anion gap: 9 (ref 5–15)
BUN: 7 mg/dL — ABNORMAL LOW (ref 8–23)
CO2: 24 mmol/L (ref 22–32)
Calcium: 8 mg/dL — ABNORMAL LOW (ref 8.9–10.3)
Chloride: 105 mmol/L (ref 98–111)
Creatinine, Ser: 0.71 mg/dL (ref 0.44–1.00)
GFR, Estimated: >60 mL/min (ref >60)
Glucose, Bld: 108 mg/dL — ABNORMAL HIGH (ref 70–99)
Potassium: 3.8 mmol/L (ref 3.5–5.1)
Sodium: 138 mmol/L (ref 135–145)

## 2019-11-18 NOTE — Progress Notes (Signed)
STROKE TEAM PROGRESS NOTE   INTERVAL HISTORY pt had right groin swelling and tenderness at puncture site, found to have groin hematoma. Had [place] 2 days ago showed pseudoaneurysm, Dr. Earleen Newport performed thrombin injection. Repeat ultrasound with resolution.  Discussed with 2 daughters, one was at bedside yesterday and a different Daughter is at bedside today, they initially requested patient go home with physical therapy, she lives alone with her 18 year old husband. But after physical therapy reevaluation she now needs CIR. She had a bowel movement yesterday. She was dizzy yesterday but gave fluid bolus and today much improved. Pending CIR. OBJECTIVE Vitals:   11/17/19 2026 11/17/19 2323 11/18/19 0406 11/18/19 0727  BP: 107/75 (!) 155/69 (!) 163/66 (!) 165/89  Pulse: 97 87 90 81  Resp: 18 18 16 18   Temp: 97.7 F (36.5 C) 99.1 F (37.3 C) 98.3 F (36.8 C) 98.4 F (36.9 C)  TempSrc: Oral Oral Oral Oral  SpO2: 96% 98% 100% 94%  Weight:      Height:       CBC:  Recent Labs  Lab 11/13/19 2244 11/13/19 2248 11/17/19 0220 11/18/19 0248  WBC 6.3   < > 9.2 10.0  NEUTROABS 3.8  --   --   --   HGB 12.8   < > 9.1* 8.4*  HCT 40.4   < > 28.3* 25.9*  MCV 100.0   < > 98.6 97.4  PLT 343   < > 283 287   < > = values in this interval not displayed.   Basic Metabolic Panel:  Recent Labs  Lab 11/15/19 0408 11/16/19 1206 11/17/19 0220 11/18/19 0248  NA 140   < > 139 138  K 3.9   < > 4.0 3.8  CL 110   < > 105 105  CO2 22   < > 27 24  GLUCOSE 92   < > 117* 108*  BUN 7*   < > 5* 7*  CREATININE 0.71   < > 0.66 0.71  CALCIUM 7.9*   < > 8.1* 8.0*  MG 1.9  --   --   --    < > = values in this interval not displayed.   Lipid Panel:     Component Value Date/Time   CHOL 70 11/14/2019 0408   TRIG 126 11/14/2019 0408   TRIG 127 11/14/2019 0408   HDL 19 (L) 11/14/2019 0408   CHOLHDL 3.7 11/14/2019 0408   VLDL 25 11/14/2019 0408   LDLCALC 26 11/14/2019 0408   HgbA1c:  Lab Results  Component  Value Date   HGBA1C 5.7 (H) 11/14/2019   Urine Drug Screen: No results found for: LABOPIA, COCAINSCRNUR, LABBENZ, AMPHETMU, THCU, LABBARB  Alcohol Level     Component Value Date/Time   ETH <10 11/13/2019 2244    IMAGING  CT Code Stroke CTA Head W/WO contrast CT Code Stroke CTA Neck W/WO contrast 11/13/2019 IMPRESSION:  1. Positive for occlusion of the Right PCA origin.  2. No other large vessel occlusion, and generally mild for age atherosclerosis in the head and neck.  3. Emphysema (ICD10-J43.9) with layering left pleural effusion, patchy left upper lobe opacity compatible with pneumonia in this setting.  4. Aortic Atherosclerosis (ICD10-I70.0)   CT HEAD WO CONTRAST 11/14/2019 IMPRESSION:  1. Status post Right PCA endovascular revascularization with trace subarachnoid blood/contrast in the interpeduncular cistern.  2. Small 10 mm area of contrast staining versus petechial hemorrhage in the medial right thalamus. And question subtle increased right thalamic hypodensity elsewhere, but  no other Right PCA territory infarct changes are identified.   MR BRAIN WO CONTRAST MR ANGIO HEAD WO CONTRAST 11/14/2019 IMPRESSION:  Acute infarct right thalamus with associated mild hemorrhage as noted on recent CT. There is also mild subarachnoid hemorrhage in the interpeduncular cistern unchanged from CT. Additional small areas of acute infarct in the right cerebellum and right frontal cortex. Moderate atrophy. Chronic microvascular ischemic change in the white matter. Right PCA patent post thrombectomy. There is a mild stenosis in the right PCA at the P1/ P2 junction.   [name] 11/14/2019 IMPRESSION:  Status post ultrasound guided access right common femoral artery for cervical and cerebral angiogram and mechanical thrombectomy of right posterior cerebral artery for treatment of posterior circulation ELVO, with TICI 3 flow at the conclusion. PLAN: The patient will remain intubated, as  small volume subarachnoid hemorrhage was confirmed with formal CT ICU status Target systolic blood pressure of 120-140 Right hip straight time 6 hours Frequent neurovascular checks Repeat neurologic imaging with CT and/MRI at the discretion of neurology team.  DG CHEST PORT 1 VIEW 11/14/2019 IMPRESSION: Endotracheal tube placed with tip measuring 4.7 cm above the carina. Cardiac enlargement with small bilateral pleural effusions. Probable chronic interstitial changes.   CT HEAD CODE STROKE WO CONTRAST 11/13/2019 IMPRESSION:  Age indeterminate small vessel disease including in the right thalamus, but no acute cortically based infarct or acute intracranial hemorrhage identified. ASPECTS 10.   Transthoracic Echocardiogram  11/14/2019 1. Left ventricular ejection fraction, by estimation, is 60 to 65%. The left ventricle has normal function. The left ventricle has no regional wall motion abnormalities. There is severe asymmetric left ventricular hypertrophy of the basal-septal segment. Left ventricular diastolic function could not be evaluated.  2. Right ventricular systolic function is normal. The right ventricular size is normal. There is mildly elevated pulmonary artery systolic pressure.  3. Left atrial size was moderately dilated.  4. A small pericardial effusion is present. The pericardial effusion is circumferential.  5. The mitral valve is normal in structure. Trivial mitral valve regurgitation. No evidence of mitral stenosis. Moderate mitral annular calcification.  6. The aortic valve is tricuspid. There is mild calcification of the aortic valve. There is mild thickening of the aortic valve. Aortic valve regurgitation is trivial. Mild aortic valve sclerosis is present, with no evidence of aortic valve stenosis.  7. The inferior vena cava is normal in size with greater than 50% respiratory variability, suggesting right atrial pressure of 3 mmHg.   ECG - atrial fibrillation -  ventricular response 87 BPM (See cardiology reading for complete details)   PHYSICAL EXAM   Temp:  [97.7 F (36.5 C)-99.1 F (37.3 C)] 98.4 F (36.9 C) (10/24 0727) Pulse Rate:  [81-97] 81 (10/24 0727) Resp:  [14-20] 18 (10/24 0727) BP: (88-165)/(49-92) 165/89 (10/24 0727) SpO2:  [94 %-100 %] 94 % (10/24 0727)  General - Well nourished, well developed, no acute distress.  Ophthalmologic - fundi not visualized due to noncooperation.  Cardiovascular - irregularly irregular heart rate and rhythm.  Mental Status -  Level of arousal and orientation to place, age, month and person were intact, however, not orientated to year. Language exam showed no aphasia, able to name and repeat, follow simple commands.    Cranial Nerves II - XII - II - Visual field intact OU. III, IV, VI - Extraocular movements intact. V - Facial sensation intact bilaterally. VII - subtle left nasolabial fold flattening. VIII - Hearing & vestibular intact bilaterally. X -  Palate elevates symmetrically. XI - Chin turning & shoulder shrug intact bilaterally. XII - Tongue protrusion intact.  Motor Strength - The patient's strength was normal in right upper and lower extremities, left upper extremity drift with mildly decreased finger grip, left LE 4/5 proximal and 4+/5 distally.  Bulk was normal and fasciculations were absent.   Motor Tone - Muscle tone was assessed at the neck and appendages and was normal.  Reflexes - The patient's reflexes were symmetrical in all extremities and she had no pathological reflexes.  Sensory - Light touch, temperature/pinprick were assessed and were symmetrical.    Coordination - The patient had normal movements in the hands with no ataxia or dysmetria.  Tremor was absent.  Gait and Station - able to stand with assistance off of the commode, hold on to walker, and pivot to sit in chair.   ASSESSMENT/PLAN [name] is a 86 y.o. female with history of Afib/flutter s/p  ablation, off AC due to persistent sinus rhythm, and a recent diagnosis of pneumonia (she was not improving on the first round of antibiotics tried and therefore was broadened to Augmentin on Monday), presenting with acute onset right gaze preference and left-sided weakness. The patient received IV t-PA Tuesday 11/19 at 10:59 PM and sent to IR for R PCA occlusion.  Stroke: R thalamic, cerebellar, and frontal small infarcts with right PCA occlusion s/p tPA and IR with TICI3 from AF not on AC Small SAH interpeduncular cistern post IR  CT Head - Age indeterminate small vessel disease including in the right thalamus, but no acute cortically based infarct or acute intracranial hemorrhage identified.   CTA H&N - Positive for occlusion of the Right PCA origin. No other large vessel occlusion, and generally mild for age atherosclerosis in the head and neck.   Cerebral angio/ IR - Rt PCA occlusion with TICI 3 flow   CT head - Status post Right PCA endovascular revascularization with trace subarachnoid blood/contrast in the interpeduncular cistern. Small 10 mm area of contrast staining versus petechial hemorrhage in the medial right thalamus.   MRI head - R thalamus infarct with associated mild hemorrhage as noted on recent CT. There is also mild subarachnoid hemorrhage in the interpeduncular cistern unchanged from CT. Additional small areas of acute infarct in the right cerebellum and right frontal cortex. Moderate atrophy   MRA head -  Right PCA patent post thrombectomy. There is a mild stenosis in the right PCA at the P1/ P2 junction.   2D Echo - EF 60-65%. No source of embolus. LA moderately dilated  [employer] Virus 2 - negative  LDL - 26  HgbA1c - 5.7  VTE prophylaxis - Heparin 5000 units sq tid   aspirin 81 mg daily prior to admission, now on Eliquis.  Therapy recommendations:  [facility] OT, [place] PT - Pt now being considered for CIR - Rehab MD consult placed  Disposition:  CIR  R Groin  Hematoma  Developed hospital D#2 at angio puncture site  Groin pseudoaneurysm [place] 2.0 x 2.4 R CFA pseudoaneurysm w/ hematoma  Thrombin injection 10/22 Earleen Newport)  Repeat US 10/23 - no evidence of pseudoaneurysm - preliminary  Acute Respiratory Failure, resolved  Intubated for IR, left intubated post IR   Sedated w/ fentanyl and propofol, now off  Extubated 10/20  Patient stable  CCM signed off   Atrial Fibrillation/Aflutter  Home anticoagulation:  none   post ablation Dr. Ashley Mariner 2004. Doing very well. No further recurrences.  No AC  now given post tPA small SAH   Now on aspirin 325  Consider AC once SAH resolved (probably in 5-7 days post stroke)     Follows with Dr. Marlou Porch at [facility]  No response to intial abx  Broadened as OP to Augmention 10/18  Still has intermittent coughing  CTA - layering left pleural effusion, patchy left upper lobe opacity compatible with pneumonia  CXR 10/20 - small bilateral pleural effusions. Probable chronic interstitial changes.  WBCs 6.3->7.6->7.3 (afebrile)  Hypertension  Home BP meds: metoprolol   Treated short term w/ cleviprex, off this am  BP stable . BP goal < 160  . Long-term BP goal normotensive  Hyperlipidemia  Home Lipid lowering medication: Lipitor 10 mg daily  LDL 26, goal < 70  Will not resume lipitor due to low LDL level  Dysphagia, resolved . Secondary to stroke . Cleared for regular consistency, thin liquid diet . Off IVF  . Sore throat after extubation   Other Stroke Risk Factors  Advanced age  Former cigarette smoker - quit  ETOH use, advised to drink no more than 1 alcoholic beverage per day.  Other Active Problems  Code status - Full code  Emphysema (TAV69-V94.9)   Aortic Atherosclerosis (ICD10-I70.0)   Anemia - Hgb - 12.8->13.3-> 10.5->9.8->8.8->9.1->8.4 (repeat CBC pending for Monday)  Hypokalemia - K 3.8 - resolved - 4.0->3.8   Therapists  to revaluate pt after prolonged bedrest due to pseudoaneurysm at groin, resolved. Pt now recommended for CIR - Rehab MD consult placed.  Pt is 5 days out - Eliquis started today 10/24  Constipated - no recent BM -> Pt had BM Saturday 10/23.  Hospital day # 5  pt had right groin swelling and tenderness at puncture site, found to have groin hematoma. Had [place] 2 days ago showed pseudoaneurysm, Dr. Earleen Newport performed thrombin injection. Repeat ultrasound with resolution.  Discussed with 2 daughters, one was at bedside yesterday and a different Daughter is at bedside today, they initially requested patient go home with physical therapy, she lives alone with her 53 year old husband. But after physical therapy reevaluation she now needs CIR. She had a bowel movement yesterday. She was dizzy yesterday but gave fluid bolus and today much improved. Pending CIR.   Personally examined patient and images, and have participated in and made any corrections needed to history, physical, neuro exam,assessment and plan as stated above.  I have personally obtained the history, evaluated lab date, reviewed imaging studies and agree with radiology interpretations.    Sarina Ill, MD Stroke Neurology  I spent 25 minutes of face-to-face and non-face-to-face time with patient. This included prechart review, lab review, study review, order entry, electronic health record documentation, patient education on the different diagnostic and therapeutic options, counseling and coordination of care, risks and benefits of management, compliance, or risk factor reduction    To contact Stroke Continuity provider, please refer to [email or web address] After hours, contact General Neurology

## 2019-11-18 NOTE — Progress Notes (Signed)
Physical Therapy Treatment Patient Details Name: [name] MRN: [id number] DOB: 27-Jul-1931 Today's Date: 11/18/2019    History of Present Illness 86 yo with acute right thalamic infarct s/p thrombectomy of Rt PCA 10/20 with revascularization, extubated 10/20. PMHx:Afib, SVT    PT Comments    Pt tolerates treatment well, although requiring multiple rest breaks due to reports of fatigue. Pt reports a significant fear of falling, and often leans posteriorly during session. PT provides significant cueing during transfers and ambulation attempts to promote anterior lean to bring balance back to neutral. Pt is generally weak and remains at a high risk of falling at this time. Pt will continue to benefit from acute PT POC to improve mobility quality. PT recommends CIR at this time as the pt remains very unsteady and demonstrate the potential to make significant functional gains with high intensity inpatient PT services.   Follow Up Recommendations  CIR     Equipment Recommendations  Wheelchair (measurements PT);Wheelchair cushion (measurements PT);Rolling walker with 5" wheels;3in1 (PT) (if home today)    Recommendations for Other Services       Precautions / Restrictions Precautions Precautions: Fall Precaution Comments: watch BP, dizziness Restrictions Weight Bearing Restrictions: No    Mobility  Bed Mobility Overal bed mobility: Needs Assistance Bed Mobility: Supine to Sit     Supine to sit: Mod assist     General bed mobility comments: HOB elevated, most assistance required for scooting forward, left lateral lean  Transfers Overall transfer level: Needs assistance Equipment used: Rolling walker (2 wheeled) Transfers: Sit to/from [facility] Sit to Stand: Mod assist;Min assist Stand pivot transfers: Mod assist       General transfer comment: min-modA with use of RW, PT cues to encouraged forward lean, nose over toes, and for hand  placement  Ambulation/Gait Ambulation/Gait assistance: Mod assist Gait Distance (Feet): 2 Feet Assistive device: Rolling walker (2 wheeled) Gait Pattern/deviations: Shuffle Gait velocity: reduced Gait velocity interpretation: <1.31 ft/sec, indicative of household ambulator General Gait Details: pt with short shuffling steps, posterior and L lateral lean. PT cues to lean anteriorly onto RW and for step sequencing   Stairs             Wheelchair Mobility    Modified Rankin (Stroke Patients Only) Modified Rankin (Stroke Patients Only) Pre-Morbid Rankin Score: No symptoms Modified Rankin: Moderately severe disability     Balance Overall balance assessment: Needs assistance Sitting-balance support: Single extremity supported;Feet supported Sitting balance-Leahy Scale: Poor Sitting balance - Comments: minA with UE support of railing Postural control: Left lateral lean Standing balance support: Bilateral upper extremity supported Standing balance-Leahy Scale: Poor Standing balance comment: minA with BUE support of RW, modA without UE support                            Cognition Arousal/Alertness: Awake/alert Behavior During Therapy: WFL for tasks assessed/performed Overall Cognitive Status: Impaired/Different from baseline Area of Impairment: Awareness;Problem solving;Safety/judgement                         Safety/Judgement: Decreased awareness of deficits Awareness: Emergent Problem Solving: Slow processing;Difficulty sequencing        Exercises General Exercises - Lower Extremity Ankle Circles/Pumps: AROM;Both;20 reps Gluteal Sets: AROM;Both;5 reps Long Arc Quad: AROM;Both;20 reps Straight Leg Raises: AROM;Both;5 reps    General Comments General comments (skin integrity, edema, etc.): Pt cites fatigue throughout session and intermittent dizziness.  BP in sitting 157/74, s/p transfer to recliner 141/86, s/p transfer to [facility] 151/80, after  final transfer to recliner 133/74      Pertinent Vitals/Pain Pain Assessment: No/denies pain    Home Living                      Prior Function            PT Goals (current goals can now be found in the care plan section) Acute Rehab PT Goals Patient Stated Goal: to improve strength and mobility Progress towards PT goals: Progressing toward goals    Frequency    Min 4X/week      PT Plan Current plan remains appropriate    Co-evaluation              AM-PAC PT "6 Clicks" Mobility   Outcome Measure  Help needed turning from your back to your side while in a flat bed without using bedrails?: A Little Help needed moving from lying on your back to sitting on the side of a flat bed without using bedrails?: A Lot Help needed moving to and from a bed to a chair (including a wheelchair)?: A Lot Help needed standing up from a chair using your arms (e.g., wheelchair or bedside chair)?: A Little Help needed to walk in hospital room?: A Lot Help needed climbing 3-5 steps with a railing? : Total 6 Click Score: 13    End of Session   Activity Tolerance: Patient tolerated treatment well Patient left: in chair;with call bell/phone within reach;with chair alarm set;with family/visitor present Nurse Communication: Mobility status PT Visit Diagnosis: Difficulty in walking, not elsewhere classified (R26.2);Other abnormalities of gait and mobility (R26.89)     Time: 6812-7517 PT Time Calculation (min) (ACUTE ONLY): 44 min  Charges:  $Therapeutic Exercise: 8-22 mins $Therapeutic Activity: 23-37 mins                     [name], PT, DPT Acute Rehabilitation Pager: [phone]    [name] 11/18/2019, 12:17 PM

## 2019-11-19 ENCOUNTER — Encounter (HOSPITAL_COMMUNITY): Payer: Self-pay | Admitting: Neurology

## 2019-11-19 DIAGNOSIS — I63531 Cerebral infarction due to unspecified occlusion or stenosis of right posterior cerebral artery: Principal | ICD-10-CM

## 2019-11-19 LAB — BASIC METABOLIC PANEL WITH GFR
Anion gap: 9 (ref 5–15)
BUN: 8 mg/dL (ref 8–23)
CO2: 25 mmol/L (ref 22–32)
Calcium: 7.9 mg/dL — ABNORMAL LOW (ref 8.9–10.3)
Chloride: 103 mmol/L (ref 98–111)
Creatinine, Ser: 0.79 mg/dL (ref 0.44–1.00)
GFR, Estimated: >60 mL/min (ref >60)
Glucose, Bld: 108 mg/dL — ABNORMAL HIGH (ref 70–99)
Potassium: 3.4 mmol/L — ABNORMAL LOW (ref 3.5–5.1)
Sodium: 137 mmol/L (ref 135–145)

## 2019-11-19 LAB — CBC
HCT: 26.6 % — ABNORMAL LOW (ref 36.0–46.0)
Hemoglobin: 8.5 g/dL — ABNORMAL LOW (ref 12.0–15.0)
MCH: 31.5 pg (ref 26.0–34.0)
MCHC: 32 g/dL (ref 30.0–36.0)
MCV: 98.5 fL (ref 80.0–100.0)
Platelets: 314 K/uL (ref 150–400)
RBC: 2.7 MIL/uL — ABNORMAL LOW (ref 3.87–5.11)
RDW: 12.6 % (ref 11.5–15.5)
WBC: 8.1 K/uL (ref 4.0–10.5)
nRBC: 0.4 % — ABNORMAL HIGH (ref 0.0–0.2)

## 2019-11-19 NOTE — Progress Notes (Signed)
Referring Physician(s): Code stroke- Bhagat, Dutch Quint (neurology)  Supervising Physician: Corrie Mckusick  Patient Status:  [facility] - In-pt  Chief Complaint: None  Subjective:  History of acute CVA s/p cerebral arteriogram with emergent mechanical thrombectomy of right PCA occlusion achieving a TICI 3 revascularization via right femoral approach 11/14/2019 by Dr. Earleen Newport. Patient awake and alert laying in bed with no complaints at this time. Daughter at bedside. Right femoral puncture site c/d/i.   Allergies: Bee venom, Mixed vespid venom, Codeine, Sulfa antibiotics, and Erythromycin  Medications: Prior to Admission medications   Medication Sig Start Date End Date Taking? Authorizing Provider  amoxicillin-clavulanate (AUGMENTIN) 875-125 MG tablet Take 1 tablet by mouth 2 (two) times daily. 11/12/19  Yes [provider]  aspirin 81 MG tablet Take 1 tablet (81 mg total) by mouth daily. 01/08/13  Yes Carlena Bjornstad, MD  atorvastatin (LIPITOR) 10 MG tablet Take 10 mg by mouth daily.   Yes [provider]  BIOTIN PO Take 1 tablet by mouth daily.    Yes [provider]  CALCIUM PO Take 1 tablet by mouth daily.    Yes [provider]  Cholecalciferol (VITAMIN D3) 50 MCG (2000 UT) CAPS Take 2,000 Units by mouth daily.   Yes [provider]  Coenzyme Q10 (CO Q-10 PO) Take 1 tablet by mouth daily.    Yes [provider]  metoprolol succinate (TOPROL-XL) 50 MG 24 hr tablet Take 50 mg by mouth daily. Take with or immediately following a meal.   Yes [provider]  EPINEPHrine 0.3 mg/0.3 mL IJ SOAJ injection Inject 0.3 mg into the muscle as needed for anaphylaxis.  08/04/18   [provider]  Investigational - Study Medication Additional Study Details    [provider]  trimethoprim (TRIMPEX) 100 MG tablet Take 100 mg by mouth as directed. Patient not taking: Reported on 11/14/2019    [provider]      Vital Signs: BP (!) 143/66 (BP Location: Right Arm)   Pulse 81   Temp 99.1 F (37.3 C) (Oral)   Resp 18   Ht 5\' 1"  (1.549 m)   Wt 146 lb 6.2 oz (66.4 kg)   LMP  (LMP Unknown)   SpO2 93%   BMI 27.66 kg/m   Physical Exam Vitals and nursing note reviewed.  Constitutional:      General: She is not in acute distress. Pulmonary:     Effort: Pulmonary effort is normal. No respiratory distress.  Skin:    General: Skin is warm and dry.     Comments: Right femoral puncture site with minimal bruising and mild tenderness to palpation, no active bleeding or hematoma noted.  Neurological:     Mental Status: She is alert.     Comments: Alert, awake, and oriented x3. Speech and comprehension intact. Can spontaneously move all extremities. Distal pulses (DPs) 2+ bilaterally.      Imaging: US Guided Needle Placement  Result Date: 11/16/2019 INDICATION: 86 year old female with a history of right common femoral artery pseudoaneurysm status post access for mechanical thrombectomy 11/14/2019 EXAM: ULTRASOUND-GUIDED NEEDLE PLACEMENT RIGHT COMMON FEMORAL ARTERY PSEUDOANEURYSM IMAGING GUIDED THROMBIN INJECTION MEDICATIONS: 3500 units thrombin ANESTHESIA/SEDATION: None Patient's level of consciousness and vital signs were monitored continuously by radiology nursing throughout the procedure under my direct supervision. FLUOROSCOPY TIME:  Ultrasound COMPLICATIONS: None PROCEDURE: Informed written consent was obtained from the patient after a thorough discussion of the procedural risks, benefits and alternatives. All questions were addressed.  Maximal Sterile Barrier Technique was utilized including caps, mask, sterile gowns, sterile gloves, sterile drape, hand hygiene and skin antiseptic. A timeout was performed prior to the initiation of the procedure. Patient was positioned supine position on the ultrasound stretcher. Ultrasound images of the right inguinal region were performed with images stored  sent to PACs. The patient was then prepped and draped in the usual sterile fashion. 1% lidocaine was used for local anesthesia. 5000 units of bovine thrombin was reconstituted in the proprietary package. A 22 gauge spinal needle was used to access the dome of the pseudoaneurysm with ultrasound guidance, at a site as far distal from the perceived neck as possible, on the inferior aspect. A 6 inch tubing was then attached to the needle after the stylet was removed with arterial blood return. A 6 inch tubing set was attached to a 3 way stopcock. 5 cc/5000 units of bovine reconstituted thrombin were drawn into a 5 cc syringe, attached to 1 of the side arm of the stopcock. The other side arm excepted a 3 cc syringe. Under ultrasound observation, small aliquots of the thrombin were injected using the 3 cc syringe. After approximately 2000 units were injected, we paused for observation. After we observed 2 minutes there was continuous flow in the dome. Upon attempted further injection the needle was occluded. This needle was removed and a new spinal needle was placed. Stylet was removed confirming arterial blood return. The short tubing set was reattached and additional 1500 units were slowly injected in small aliquots under ultrasound observation. Ultimately we achieved stagnant flow with thrombosis of the pseudoaneurysm. Needle was removed and final images were stored. We confirmed positive maintain Doppler signal in both the dorsalis pedis and posterior tibial at the right foot. Foot remains warm. No complications were encountered and no significant blood loss. Sterile dressing was placed. 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 Signed, Dulcy Fanny. Dellia Nims, RPVI [facility] Radiology Electronically Signed   By: Corrie Mckusick D.O.   On: 11/16/2019 15:39   [place] Lower Ext Art Right  Ltd  Result Date: 11/16/2019 INDICATION: 86 year old female with a history of right common femoral artery pseudoaneurysm status post access for mechanical thrombectomy 11/14/2019 EXAM: ULTRASOUND-GUIDED NEEDLE PLACEMENT RIGHT COMMON FEMORAL ARTERY PSEUDOANEURYSM IMAGING GUIDED THROMBIN INJECTION MEDICATIONS: 3500 units thrombin ANESTHESIA/SEDATION: None Patient's level of consciousness and vital signs were monitored continuously by radiology nursing throughout the procedure under my direct supervision. FLUOROSCOPY TIME:  Ultrasound COMPLICATIONS: None PROCEDURE: Informed written consent was obtained from the patient after a thorough discussion of the procedural risks, benefits and alternatives. All questions were addressed. Maximal Sterile Barrier Technique was utilized including caps, mask, sterile gowns, sterile gloves, sterile drape, hand hygiene and skin antiseptic. A timeout was performed prior to the initiation of the procedure. Patient was positioned supine position on the ultrasound stretcher. Ultrasound images of the right inguinal region were performed with images stored sent to PACs. The patient was then prepped and draped in the usual sterile fashion. 1% lidocaine was used for local anesthesia. 5000 units of bovine thrombin was reconstituted in the proprietary package. A 22 gauge spinal needle was used to access the dome of the pseudoaneurysm with ultrasound guidance, at a site as far distal from the perceived neck as possible, on the inferior aspect. A 6 inch tubing was then attached to the needle after the stylet was removed with arterial blood return. A 6 inch  tubing set was attached to a 3 way stopcock. 5 cc/5000 units of bovine reconstituted thrombin were drawn into a 5 cc syringe, attached to 1 of the side arm of the stopcock. The other side arm excepted a 3 cc syringe. Under ultrasound observation, small aliquots of the thrombin were injected using the 3 cc syringe. After approximately 2000  units were injected, we paused for observation. After we observed 2 minutes there was continuous flow in the dome. Upon attempted further injection the needle was occluded. This needle was removed and a new spinal needle was placed. Stylet was removed confirming arterial blood return. The short tubing set was reattached and additional 1500 units were slowly injected in small aliquots under ultrasound observation. Ultimately we achieved stagnant flow with thrombosis of the pseudoaneurysm. Needle was removed and final images were stored. We confirmed positive maintain Doppler signal in both the dorsalis pedis and posterior tibial at the right foot. Foot remains warm. No complications were encountered and no significant blood loss. Sterile dressing was placed. 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 Signed, Dulcy Fanny. Dellia Nims, RPVI [facility] Inter[facility] Radiology Special[facility] Radiology Electronically Signed   By: Corrie Mckusick D.O.   On: 11/16/2019 15:39   VAS [place] GROIN PSEUDOANEURYSM  Result Date: 11/18/2019  ARTERIAL PSEUDOANEURYSM  Exam: follow up thrombin injection Comparison Study: previous 11/16/19 Performing Technologist: Abram Sander RVS  Examination Guidelines: A complete evaluation includes B-mode imaging, spectral Doppler, color Doppler, and power Doppler as needed of all accessible portions of each vessel. Bilateral testing is considered an integral part of a complete examination. Limited examinations for reoccurring indications may be performed as noted. +------------+----------+---------+------+----------+ Right DuplexPSV (cm/s)Waveform PlaqueComment(s) +------------+----------+---------+------+----------+ CFA            104    triphasic                 +------------+----------+---------+------+----------+ Prox SFA       141    triphasic                  +------------+----------+---------+------+----------+  Summary: No evidence of pseudoaneurysm, AVF or DVT  Diagnosing physician: Harold Barban MD Electronically signed by Harold Barban MD on 11/18/2019 at 3:16:17 PM.   --------------------------------------------------------------------------------    Final    VAS [place] GROIN PSEUDOANEURYSM  Result Date: 11/16/2019  ARTERIAL PSEUDOANEURYSM  Exam: Right groin Indications: Patient complains of groin pain. History: S/p catheterization. Comparison Study: No prior studies. Performing Technologist: Darlin Coco, RDMS  Examination Guidelines: A complete evaluation includes B-mode imaging, spectral Doppler, color Doppler, and power Doppler as needed of all accessible portions of each vessel. Bilateral testing is considered an integral part of a complete examination. Limited examinations for reoccurring indications may be performed as noted. +------------+----------+---------+------+----------+ Right DuplexPSV (cm/s)Waveform PlaqueComment(s) +------------+----------+---------+------+----------+ CFA            130    triphasic                 +------------+----------+---------+------+----------+ PFA             63    biphasic                  +------------+----------+---------+------+----------+ Prox SFA        80    triphasic                 +------------+----------+---------+------+----------+ Right Vein comments:Patent with normal flow  Findings: An area with well defined  borders measuring 2.0 cm x 2.4 cm was visualized arising off of the CFA distal with ultrasound characteristics of a pseudoaneurysm. The neck measures approximately 0.3 cm wide and 0.9 cm long. A mixed echogenic structure is visualized at the medial to CFV and CFA with ultrasound characteristics of a hematoma.  Diagnosing physician: Servando Snare MD Electronically signed by Servando Snare MD on 11/16/2019 at 1:09:11 PM.    --------------------------------------------------------------------------------    Final     Labs:  CBC: Recent Labs    11/16/19 1206 11/17/19 0220 11/18/19 0248 11/19/19 0401  WBC 7.3 9.2 10.0 8.1  HGB 8.8* 9.1* 8.4* 8.5*  HCT 27.0* 28.3* 25.9* 26.6*  PLT 267 283 287 314    COAGS: Recent Labs    11/13/19 2244  INR 1.0  APTT 31    BMP: Recent Labs    11/16/19 1206 11/17/19 0220 11/18/19 0248 11/19/19 0401  NA 138 139 138 137  K 3.8 4.0 3.8 3.4*  CL 105 105 105 103  CO2 27 27 24 25   GLUCOSE 119* 117* 108* 108*  BUN 6* 5* 7* 8  CALCIUM 7.9* 8.1* 8.0* 7.9*  CREATININE 0.71 0.66 0.71 0.79  GFRNONAA >60 >60 >60 >60    LIVER FUNCTION TESTS: Recent Labs    11/13/19 2244 11/15/19 0408  BILITOT 0.8 0.9  AST 63* 24  ALT 61* 32  ALKPHOS 46 37*  PROT 6.2* 5.1*  ALBUMIN 2.8* 2.4*    Assessment and Plan:  History of acute CVA s/p cerebral arteriogram with emergent mechanical thrombectomy of right PCA occlusion achieving a TICI 3 revascularization via right femoral approach 11/14/2019 by Dr. Earleen Newport. Patient's condition improving- awake and alert, following simple commands, moving all extremities. Right femoral puncture site stable s/p thrombin injection. Still with tenderness of site secondary to prior hematoma- recommend alternating between ice packs/warm packs along with ace wrap compression of thigh. Plan to follow-up with Dr. Earleen Newport in clinic 4 weeks after discharge (our schedulers to call patient to set up this appointment). Further plans per neurology/CCM- appreciate and agree with management. Please call NIR with questions/concerns.   Electronically Signed: Earley Abide, PA-C 11/19/2019, 10:28 AM   I spent a total of 15 Minutes at the the patient's bedside AND on the patient's hospital floor or unit, greater than 50% of which was counseling/coordinating care for CVA s/p revascularization.

## 2019-11-19 NOTE — Progress Notes (Signed)
  Speech Language Pathology Treatment: Cognitive-Linquistic  Patient Details Name: [name] MRN: [id number] DOB: 04-07-1931 Today's Date: 11/19/2019 Time: 1540-1600 SLP Time Calculation (min) (ACUTE ONLY): 20 min  Assessment / Plan / Recommendation Clinical Impression  Patient seen seen for skilled ST session with daughter present in room. Patient was alert and cooperative. She received a score of 21/30 on SLUMS test which is one point better than when she completed it on 10/22. She was fully oriented to time, place, situation, but recalled only 1/5 words and had errors in mental calculations. SLP spent portion of session describing CIR therapy department and expectations.   HPI HPI: 86 y.o. female with a PMHx of Afib/flutter s/p ablation, off AC due to persistent sinus rhythm, and a recent diagnosis of pneumonia, presenting with acute onset right gaze preference and left-sided weakness.      SLP Plan  Continue with current plan of care       Recommendations   CIR                Follow up Recommendations: Inpatient Rehab SLP Visit Diagnosis: Cognitive communication deficit (R41.841) Attention and concentration deficit following: Cerebral infarction Plan: Continue with current plan of care       Barnett, MA, [place] [facility] Acute Rehab

## 2019-11-19 NOTE — Progress Notes (Signed)
Physical Therapy Treatment Patient Details Name: [name] MRN: [id number] DOB: 12/02/1931 Today's Date: 11/19/2019    History of Present Illness 86 yo with acute right thalamic infarct s/p thrombectomy of Rt PCA 10/20 with revascularization, extubated 10/20. PMHx:Afib, SVT    PT Comments    Pt reports fatigue, but is motivated to get OOB with PT. Pt participated well in short-distance gait training, requiring min-mod assist for all mobility today due to weakness and impaired balance. Pt tolerated LE exercises well once in recliner. Pt reporting dizziness with mobility, BP WFL and improved with continued mobility. Will continue to follow acutely.   Follow Up Recommendations  CIR     Equipment Recommendations  Wheelchair (measurements PT);Wheelchair cushion (measurements PT);Rolling walker with 5" wheels;3in1 (PT) (if home today)    Recommendations for Other Services       Precautions / Restrictions Precautions Precautions: Fall Precaution Comments: watch BP, dizziness Restrictions Weight Bearing Restrictions: No    Mobility  Bed Mobility Overal bed mobility: Needs Assistance Bed Mobility: Supine to Sit     Supine to sit: Min assist     General bed mobility comments: min assist for truncal elevation, scooting to EOB. Pt able to use bedrails to initate trunk elevation off of bed.  Transfers Overall transfer level: Needs assistance Equipment used: Rolling walker (2 wheeled) Transfers: Sit to/from [facility] Sit to Stand: Min assist Stand pivot transfers: Min assist       General transfer comment: Min assist for power up, steadying, and slow eccentric lower into recliner at bedside. STS x2, from EOB and recliner.  Ambulation/Gait Ambulation/Gait assistance: Min assist;Mod assist Gait Distance (Feet): 10 Feet Assistive device: Rolling walker (2 wheeled) Gait Pattern/deviations: Step-through pattern;Decreased stride length;Shuffle;Trunk  flexed Gait velocity: decr   General Gait Details: min-mod assist for steadying, physically maneuvering RW, multimodal cuing for placement in RW, upright posture, taking larger steps .   Stairs             Wheelchair Mobility    Modified Rankin (Stroke Patients Only) Modified Rankin (Stroke Patients Only) Pre-Morbid Rankin Score: No symptoms Modified Rankin: Moderately severe disability     Balance Overall balance assessment: Needs assistance Sitting-balance support: Single extremity supported;Feet supported Sitting balance-Leahy Scale: Fair Sitting balance - Comments: sits EOB without external support, not dynamically Postural control: Left lateral lean Standing balance support: Bilateral upper extremity supported Standing balance-Leahy Scale: Poor Standing balance comment: reliant on PT and RW for external assist                            Cognition Arousal/Alertness: Awake/alert Behavior During Therapy: WFL for tasks assessed/performed Overall Cognitive Status: Impaired/Different from baseline Area of Impairment: Awareness;Following commands;Safety/judgement;Problem solving                       Following Commands: Follows one step commands with increased time;Follows multi-step commands inconsistently Safety/Judgement: Decreased awareness of safety Awareness: Emergent Problem Solving: Slow processing;Difficulty sequencing        Exercises General Exercises - Lower Extremity Ankle Circles/Pumps: AROM;Both;10 reps;Seated Long Arc Quad: AROM;Both;10 reps;Seated Hip ABduction/ADduction: AROM;5 reps;Both;Seated    General Comments General comments (skin integrity, edema, etc.): Pt reporting "unsteady" vs dizzy feeling upon sitting EOB, BP once in recliner 139/69(89) and pulse 76 bpm.      Pertinent Vitals/Pain Pain Assessment: Faces Faces Pain Scale: Hurts little more Pain Location: R groin Pain Descriptors / Indicators:  Sore Pain  Intervention(s): Limited activity within patient's tolerance;Monitored during session    Home Living                      Prior Function            PT Goals (current goals can now be found in the care plan section) Acute Rehab PT Goals Patient Stated Goal: to improve strength and mobility PT Goal Formulation: With patient/family Time For Goal Achievement: 11/29/19 Potential to Achieve Goals: Good Progress towards PT goals: Progressing toward goals    Frequency    Min 4X/week      PT Plan Current plan remains appropriate    Co-evaluation              AM-PAC PT "6 Clicks" Mobility   Outcome Measure  Help needed turning from your back to your side while in a flat bed without using bedrails?: A Little Help needed moving from lying on your back to sitting on the side of a flat bed without using bedrails?: A Little Help needed moving to and from a bed to a chair (including a wheelchair)?: A Lot Help needed standing up from a chair using your arms (e.g., wheelchair or bedside chair)?: A Little Help needed to walk in hospital room?: A Lot Help needed climbing 3-5 steps with a railing? : Total 6 Click Score: 14    End of Session   Activity Tolerance: Patient tolerated treatment well;Patient limited by fatigue Patient left: in chair;with call bell/phone within reach;with chair alarm set;with family/visitor present Nurse Communication: Mobility status PT Visit Diagnosis: Difficulty in walking, not elsewhere classified (R26.2);Other abnormalities of gait and mobility (R26.89)     Time: 1240-1304 PT Time Calculation (min) (ACUTE ONLY): 24 min  Charges:  $Therapeutic Activity: 23-37 mins                     Deanta Mincey E, PT Acute Rehabilitation Services Pager [phone]  Office [phone]    Keene Gilkey D Kanye Depree 11/19/2019, 1:35 PM

## 2019-11-19 NOTE — Consult Note (Signed)
Physical Medicine and Rehabilitation Consult   Reason for Consult: Stroke with functional deficits Referring Physician: Dr. Erlinda Hong   HPI: [name] is a 86 y.o. female with history of A fib/A flutter s/p ablation and off AC due to NSR, recent diagnosis of PNA who was admitted on 11/13/19 with left sided weakness and right gaze preference. CTA head negative and CTA head showed proximal R-PCA occlusion with emphysema and layering left pleural effusion c/w PNA. She underwent cerebral angio with revascularization of R-PCA by Dr. Earleen Newport. Post procedure noted to have right groin hematoma due to femoral CFA pseudoaneurysm. She underwent ultrasound guided thrombin injection of R-CFA with good results.  Once off bedrest, therapy evaluations done revealing right lateral lean with fatigue, shot shuffling steps, has difficulty with memory, cognition as well as safety awareness. CIR recommended due to functional decline.    Review of Systems  Constitutional: Negative for chills and fever.  HENT: Negative for hearing loss and tinnitus.   Eyes: Negative for blurred vision and double vision.  Respiratory: Positive for cough and shortness of breath.   Cardiovascular: Negative for chest pain and palpitations.  Gastrointestinal: Positive for constipation. Negative for heartburn and vomiting.  Genitourinary: Negative for dysuria and urgency.       Gets up at least once at nights. Wears poise pads.   Musculoskeletal: Positive for back pain.  Neurological: Positive for speech change and weakness. Negative for dizziness and headaches.     Past Medical History:  Diagnosis Date  . Atrial fibrillation ([place])    Prior treatment with amiodarone, held sinus rhythm and drug stopped  . Atrial flutter ([place])    Status post ablation prior to  2009  . Chest pain    Nuclear, 2004, no scar or ischemia, EF 77%  . Ejection fraction    EF 70%, nuclear,  2004  //     . Fall    July, 2014  . H/O bladder repair  surgery   . Right Achilles tendinitis   . Sciatica     Past Surgical History:  Procedure Laterality Date  . IR CT HEAD LTD  11/14/2019  . IR PERCUTANEOUS ART THROMBECTOMY/INFUSION INTRACRANIAL INC DIAG ANGIO  11/14/2019  . IR US GUIDE VASC ACCESS RIGHT  11/14/2019  . no surgical hx    . RADIOLOGY WITH ANESTHESIA N/A 11/13/2019   Procedure: IR WITH ANESTHESIA;  Surgeon: Luanne Bras, MD;  Location: [facility];  Service: Radiology;  Laterality: N/A;    Family History  Problem Relation Age of Onset  . Cancer Mother   . Cancer Father        pancreatic cancer  . Heart disease Brother      Social History:  Married. Husband just turned 95 and has memory issues--family providing 24 hrs supervision. She reports that she has quit smoking at age 35. 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 Still plays bridge once a week.  She has never used smokeless tobacco. She reports current alcohol use--1-2 glasses of wine/day. She reports that she does not use drugs.    Allergies  Allergen Reactions  . Bee Venom Anaphylaxis  . Mixed Vespid Venom Anaphylaxis  . Codeine Other (See Comments)    Not known  . Sulfa Antibiotics Itching and Nausea Only  . Erythromycin Nausea And Vomiting    Medications Prior to Admission  Medication Sig Dispense Refill  . amoxicillin-clavulanate (AUGMENTIN) 875-125 MG tablet Take 1 tablet by mouth  2 (two) times daily.    Marland Kitchen aspirin 81 MG tablet Take 1 tablet (81 mg total) by mouth daily. 30 tablet 3  . atorvastatin (LIPITOR) 10 MG tablet Take 10 mg by mouth daily.    Marland Kitchen BIOTIN PO Take 1 tablet by mouth daily.     Marland Kitchen CALCIUM PO Take 1 tablet by mouth daily.     . Cholecalciferol (VITAMIN D3) 50 MCG (2000 UT) CAPS Take 2,000 Units by mouth daily.    . Coenzyme Q10 (CO Q-10 PO) Take 1 tablet by mouth daily.     . metoprolol succinate (TOPROL-XL) 50 MG 24 hr tablet Take 50 mg by mouth daily. Take with or immediately following a meal.    . EPINEPHrine 0.3  mg/0.3 mL IJ SOAJ injection Inject 0.3 mg into the muscle as needed for anaphylaxis.     . Investigational - Study Medication Additional Study Details    . trimethoprim (TRIMPEX) 100 MG tablet Take 100 mg by mouth as directed. (Patient not taking: Reported on 11/14/2019)      Home: Home Living Family/patient expects to be discharged to:: Private residence Living Arrangements: Spouse/significant other Available Help at Discharge: Family, Available 24 hours/day Type of Home: House Home Access: Stairs to enter Technical brewer of Steps: 1 Entrance Stairs-Rails: Right Home Layout: One level Bathroom Shower/Tub: Tub only, Multimedia programmer: Standard Bathroom Accessibility: Yes Home Equipment: Radio producer - single point  Lives With: Spouse  Functional History: Prior Function Level of Independence: Independent Comments: reports independent and driving, managing her medication (takes care of her husbands meds too) Functional Status:  Mobility: Bed Mobility Overal bed mobility: Needs Assistance Bed Mobility: Supine to Sit Supine to sit: Mod assist General bed mobility comments: HOB elevated, most assistance required for scooting forward, left lateral lean Transfers Overall transfer level: Needs assistance Equipment used: Rolling walker (2 wheeled) Transfers: Sit to/from Stand, [employer] Transfers Sit to Stand: Mod assist, Min assist Stand pivot transfers: Mod assist General transfer comment: min-modA with use of RW, PT cues to encouraged forward lean, nose over toes, and for hand placement Ambulation/Gait Ambulation/Gait assistance: Mod assist Gait Distance (Feet): 2 Feet Assistive device: Rolling walker (2 wheeled) Gait Pattern/deviations: Shuffle General Gait Details: pt with short shuffling steps, posterior and L lateral lean. PT cues to lean anteriorly onto RW and for step sequencing Gait velocity: reduced Gait velocity interpretation: <1.31 ft/sec, indicative of  household ambulator    ADL: ADL Overall ADL's : Needs assistance/impaired Grooming: Minimal assistance, Sitting Upper Body Bathing: Minimal assistance, Sitting Lower Body Bathing: Minimal assistance, Sit to/from stand Upper Body Dressing : Minimal assistance, Sitting Lower Body Dressing: Minimal assistance, Sit to/from stand Toilet Transfer Details (indicate cue type and reason): deferred, pt fatigued Functional mobility during ADLs: Minimal assistance, Rolling walker, Cueing for safety, Cueing for sequencing General ADL Comments: session focus on cognitive tasks related  to IADL tasks, administered the pillbox assesssment with pt failing assessment.  Cognition: Cognition Overall Cognitive Status: Impaired/Different from baseline Arousal/Alertness: Awake/alert Orientation Level: Oriented X4 Attention: Sustained Sustained Attention: Impaired Sustained Attention Impairment: Verbal basic, Functional basic Memory: Impaired Memory Impairment: Retrieval deficit, Decreased recall of new information, Decreased short term memory Decreased Short Term Memory: Verbal basic, Functional basic Immediate Memory Recall: Sock, Blue, Bed Memory Recall Sock: With Cue Memory Recall Blue: Not able to recall Memory Recall Bed: Without Cue Awareness: Impaired Awareness Impairment: Emergent impairment Problem Solving: Impaired Problem Solving Impairment: Verbal complex, Functional complex Executive Function: Organizing Organizing: Impaired  Organizing Impairment: Verbal basic, Functional basic Safety/Judgment: Impaired Comments: Stated she could go home and "function close to normal" Cognition Arousal/Alertness: Awake/alert Behavior During Therapy: WFL for tasks assessed/performed Overall Cognitive Status: Impaired/Different from baseline Area of Impairment: Awareness, Problem solving, Safety/judgement Current Attention Level: Sustained Memory: Decreased short-term memory Following Commands:  Follows multi-step commands inconsistently Safety/Judgement: Decreased awareness of deficits Awareness: Emergent Problem Solving: Slow processing, Difficulty sequencing General Comments: administered pill box assessment with pt taking > 49mins to complete task and finished tasks with > 3 errors. pt with no awareness to errors stating " well I do it different at home."   Blood pressure (!) 143/66, pulse 81, temperature 99.1 F (37.3 C), temperature source Oral, resp. rate 18, height 5\' 1"  (1.549 m), weight 66.4 kg, SpO2 93 %. Physical Exam Vitals and nursing note reviewed.  Constitutional:      Appearance: Normal appearance.  HENT:     Head: Normocephalic and atraumatic.  Eyes:     Extraocular Movements: Extraocular movements intact.     Conjunctiva/sclera: Conjunctivae normal.     Pupils: Pupils are equal, round, and reactive to light.  Cardiovascular:     Rate and Rhythm: Normal rate. Rhythm irregular.     Heart sounds: No murmur heard.   Pulmonary:     Effort: Pulmonary effort is normal. No respiratory distress.     Breath sounds: Normal breath sounds.  Abdominal:     General: Abdomen is flat. Bowel sounds are normal.     Palpations: Abdomen is soft.  Musculoskeletal:     Comments: RIght groin pain with Right hip flexion  Skin:    General: Skin is warm and dry.  Neurological:     Mental Status: She is alert and oriented to person, place, and time.     Cranial Nerves: No dysarthria.     Sensory: Sensation is intact.     Motor: No weakness or abnormal muscle tone.     Coordination: Coordination abnormal.     Comments: Mild left facial weakness with mild hoarseness. Able to follow commands without difficulty.   Decreased finger to thumb opposition on Left side   Sensation intact to LT and temp bilaterally   Psychiatric:        Mood and Affect: Mood normal.        Behavior: Behavior normal.    Oriented x 3, remembers 3/3 objects immediate recall and after 3 min  delay Results for orders placed or performed during the hospital encounter of 11/13/19 (from the past 24 hour(s))  Basic metabolic panel     Status: Abnormal   Collection Time: 11/19/19  4:01 AM  Result Value Ref Range   Sodium 137 135 - 145 mmol/L   Potassium 3.4 (L) 3.5 - 5.1 mmol/L   Chloride 103 98 - 111 mmol/L   CO2 25 22 - 32 mmol/L   Glucose, Bld 108 (H) 70 - 99 mg/dL   BUN 8 8 - 23 mg/dL   Creatinine, Ser 0.79 0.44 - 1.00 mg/dL   Calcium 7.9 (L) 8.9 - 10.3 mg/dL   GFR, Estimated >60 >60 mL/min   Anion gap 9 5 - 15  CBC     Status: Abnormal   Collection Time: 11/19/19  4:01 AM  Result Value Ref Range   WBC 8.1 4.0 - 10.5 K/uL   RBC 2.70 (L) 3.87 - 5.11 MIL/uL   Hemoglobin 8.5 (L) 12.0 - 15.0 g/dL   HCT 26.6 (L) 36 - 46 %  MCV 98.5 80.0 - 100.0 fL   MCH 31.5 26.0 - 34.0 pg   MCHC 32.0 30.0 - 36.0 g/dL   RDW 12.6 11.5 - 15.5 %   Platelets 314 150 - 400 K/uL   nRBC 0.4 (H) 0.0 - 0.2 %   No results found.   Assessment/Plan: Diagnosis: RIght PCA infarct with balance and fine motor deficits 1. Does the need for close, 24 hr/day medical supervision in concert with the patient's rehab needs make it unreasonable for this patient to be served in a less intensive setting? Yes 2. Co-Morbidities requiring supervision/potential complications: Afib/flutter, RIght groin hematoma with pain during movement 3. Due to bladder management, bowel management, safety, skin/wound care, disease management, medication administration, pain management and patient education, does the patient require 24 hr/day rehab nursing? Yes 4. Does the patient require coordinated care of a physician, rehab nurse, therapy disciplines of PT, OT to address physical and functional deficits in the context of the above medical diagnosis(es)? Yes Addressing deficits in the following areas: balance, endurance, locomotion, strength, transferring, bowel/bladder control, bathing, dressing, toileting and psychosocial  support 5. Can the patient actively participate in an intensive therapy program of at least 3 hrs of therapy per day at least 5 days per week? Yes 6. The potential for patient to make measurable gains while on inpatient rehab is excellent 7. Anticipated functional outcomes upon discharge from inpatient rehab are modified independent  with PT, modified independent with OT, n/a with SLP. 8. Estimated rehab length of stay to reach the above functional goals is: 7d 9. Anticipated discharge destination: Home 10. Overall Rehab/Functional Prognosis: excellent  RECOMMENDATIONS: This patient's condition is appropriate for continued rehabilitative care in the following setting: CIR Patient has agreed to participate in recommended program. Yes Note that insurance prior authorization may be required for reimbursement for recommended care.  Comment:   Bary Leriche, PA-C 11/19/2019  "I have personally performed a face to face diagnostic evaluation of this patient.  Additionally, I have reviewed and concur with the physician assistant's documentation above." Charlett Blake M.D. [facility] FAAPM&R (Neuromuscular Med) Diplomate Am Board of Electrodiagnostic Med Fellow Am Board of Interventional Pain

## 2019-11-19 NOTE — Progress Notes (Signed)
Pt's BP read 164/73 at 0425, IV Labetalol 5mg  given at 0452 as ordered, pt calm in bed, reassured and will continue to monitor. Obasogie-Asidi, Tariq Pernell Efe

## 2019-11-19 NOTE — Progress Notes (Signed)
Inpatient Rehab Admissions Coordinator:  Saw pt at bedside. Daughter Mayer Masker was also present.  Informed pt/family that there are no beds available in CIR today.  Pt/family continue to express interest in CIR.  Will continue to follow.   Gayland Curry, [place], [place] Admissions Coordinator [phone]

## 2019-11-19 NOTE — Progress Notes (Signed)
STROKE TEAM PROGRESS NOTE   INTERVAL HISTORY Her daughter is at the bedside. She really wants to go home - but understands and family agreeable - rehab best plan for her. Neurological exam is unchanged. Vital signs are stable. No new complaints.  OBJECTIVE Vitals:   11/19/19 0010 11/19/19 0425 11/19/19 0608 11/19/19 0806  BP: (!) 156/73 (!) 164/73 (!) 145/68 (!) 143/66  Pulse: 92 94 68 81  Resp:  18  18  Temp: 98.2 F (36.8 C) 98.2 F (36.8 C)  99.1 F (37.3 C)  TempSrc: Oral Oral  Oral  SpO2: 97% 98%  93%  Weight:      Height:       CBC:  Recent Labs  Lab 11/13/19 2244 11/13/19 2248 11/18/19 0248 11/19/19 0401  WBC 6.3   < > 10.0 8.1  NEUTROABS 3.8  --   --   --   HGB 12.8   < > 8.4* 8.5*  HCT 40.4   < > 25.9* 26.6*  MCV 100.0   < > 97.4 98.5  PLT 343   < > 287 314   < > = values in this interval not displayed.   Basic Metabolic Panel:  Recent Labs  Lab 11/15/19 0408 11/16/19 1206 11/18/19 0248 11/19/19 0401  NA 140   < > 138 137  K 3.9   < > 3.8 3.4*  CL 110   < > 105 103  CO2 22   < > 24 25  GLUCOSE 92   < > 108* 108*  BUN 7*   < > 7* 8  CREATININE 0.71   < > 0.71 0.79  CALCIUM 7.9*   < > 8.0* 7.9*  MG 1.9  --   --   --    < > = values in this interval not displayed.    IMAGING No results found.   PHYSICAL EXAM  Temp:  [98.2 F (36.8 C)-99.1 F (37.3 C)] 99.1 F (37.3 C) (10/25 0806) Pulse Rate:  [68-101] 81 (10/25 0806) Resp:  [18] 18 (10/25 0806) BP: (133-164)/(51-74) 143/66 (10/25 0806) SpO2:  [92 %-99 %] 93 % (10/25 0806)  General - Well nourished, well developed, no acute distress.  Ophthalmologic - fundi not visualized due to noncooperation.  Cardiovascular - irregularly irregular heart rate and rhythm.  Mental Status -  Level of arousal and orientation to place, age, month and person were intact, however, not orientated to year. Language exam showed no aphasia, able to name and repeat, follow simple commands.    Cranial Nerves II -  XII - II - Visual field intact OU. III, IV, VI - Extraocular movements intact. V - Facial sensation intact bilaterally. VII - subtle left nasolabial fold flattening. VIII - Hearing & vestibular intact bilaterally. X - Palate elevates symmetrically. XI - Chin turning & shoulder shrug intact bilaterally. XII - Tongue protrusion intact.  Motor Strength - The patient's strength was normal in right upper and lower extremities, left upper extremity mildly decreased finger grip, left LE 4/5 proximal and 4+/5 distally.  Bulk was normal and fasciculations were absent. Right lower extremity movements slightly limited due to hip pain Motor Tone - Muscle tone was assessed at the neck and appendages and was normal.  Reflexes - The patient's reflexes were symmetrical in all extremities and she had no pathological reflexes.  Sensory - Light touch, temperature/pinprick were assessed and were symmetrical.    Coordination - The patient had normal movements in the hands with no ataxia  or dysmetria.  Tremor was absent.  Gait and Station - able to stand with assistance off of the commode, hold on to walker, and pivot to sit in chair.   ASSESSMENT/PLAN [name] is a 86 y.o. female with history of Afib/flutter s/p ablation, off AC due to persistent sinus rhythm, and a recent diagnosis of pneumonia (she was not improving on the first round of antibiotics tried and therefore was broadened to Augmentin on Monday), presenting with acute onset right gaze preference and left-sided weakness. The patient received IV t-PA Tuesday 11/19 at 10:59 PM and sent to IR for R PCA occlusion.  Stroke: R thalamic, cerebellar, and frontal small infarcts with right PCA occlusion s/p tPA and IR with TICI3 from AF not on AC Small SAH interpeduncular cistern post IR  CT Head - Age indeterminate small vessel disease including in the right thalamus, but no acute cortically based infarct or acute intracranial hemorrhage  identified.   CTA H&N - Positive for occlusion of the Right PCA origin. No other large vessel occlusion, and generally mild for age atherosclerosis in the head and neck.   Cerebral angio/ IR - Rt PCA occlusion with TICI 3 flow   CT head - Status post Right PCA endovascular revascularization with trace subarachnoid blood/contrast in the interpeduncular cistern. Small 10 mm area of contrast staining versus petechial hemorrhage in the medial right thalamus.   MRI head - R thalamus infarct with associated mild hemorrhage as noted on recent CT. There is also mild subarachnoid hemorrhage in the interpeduncular cistern unchanged from CT. Additional small areas of acute infarct in the right cerebellum and right frontal cortex. Moderate atrophy   MRA head -  Right PCA patent post thrombectomy. There is a mild stenosis in the right PCA at the P1/ P2 junction.   2D Echo - EF 60-65%. No source of embolus. LA moderately dilated  [employer] Virus 2 - negative  LDL - 26  HgbA1c - 5.7  VTE prophylaxis - Heparin 5000 units sq tid   aspirin 81 mg daily prior to admission, now on Eliquis.  Therapy recommendations:  HH OT, HH PT ->  CIR   Disposition:  CIR  Medically ready for CIR when bed available  R Groin Hematoma  Developed hospital D#2 at angio puncture site  Groin pseudoaneurysm [place] 2.0 x 2.4 R CFA pseudoaneurysm w/ hematoma  Thrombin injection 10/22 Earleen Newport)  Repeat US 10/23 - no evidence of pseudoaneurysm - preliminary  Acute Respiratory Failure, resolved  Intubated for IR, left intubated post IR   Sedated w/ fentanyl and propofol, now off  Extubated 10/20  Patient stable  CCM signed off   Atrial Fibrillation/Aflutter  Home anticoagulation:  none   post ablation Dr. Ashley Mariner 2004. Doing very well. No further recurrences.  No AC now given post tPA small SAH   Now on aspirin 325  Consider AC once SAH resolved (probably in 5-7 days post stroke)     Follows with Dr.  Marlou Porch at [facility] Acquired PNA  No response to intial abx  Broadened as OP to Augmention 10/18  Still has intermittent coughing  CTA - layering left pleural effusion, patchy left upper lobe opacity compatible with pneumonia  CXR 10/20 - small bilateral pleural effusions. Probable chronic interstitial changes.  WBCs 6.3->7.6->7.3 (afebrile)  Hypertension  Home BP meds: metoprolol   Treated short term w/ cleviprex, off this am  BP stable . BP goal < 160  . Long-term  BP goal normotensive  Hyperlipidemia  Home Lipid lowering medication: Lipitor 10 mg daily  LDL 26, goal < 70  Will not resume lipitor due to low LDL level  Dysphagia, resolved . Secondary to stroke . Cleared for regular consistency, thin liquid diet . Off IVF  . Sore throat after extubation   Other Stroke Risk Factors  Advanced age  Former cigarette smoker - quit  ETOH use, advised to drink no more than 1 alcoholic beverage per day.  Other Active Problems  Code status - Full code  Emphysema ([id number])   Aortic Atherosclerosis (ICD10-I70.0)   Anemia - Hgb - 12.8->13.3-> 10.5->9.8->8.8->9.1->8.4 (repeat CBC pending for Monday)  Hypokalemia - K 3.8 - resolved - 4.0->3.8   Therapists to revaluate pt after prolonged bedrest due to pseudoaneurysm at groin, resolved. Pt now recommended for CIR - Rehab MD consult placed.  Pt is 5 days out - Eliquis started today 10/24  Constipated - no recent BM -> Pt had BM Saturday 10/23.  Hospital day # 6 Resume Eliquis for stroke prevention. Mobilize out of bed. Ongoing therapy consults. Transfer to inpatient rehab over the next few days when bed available. Long discussion with patient and daughter at the bedside and answered questions. Greater than 50% time during this 25-minute visit was spent on counseling and coordination of care and discussion with care team about her stroke and rehabilitation plans Antony Contras, MD  To contact  Stroke Continuity provider, please refer to [email or web address] After hours, contact General Neurology

## 2019-11-19 NOTE — PMR Pre-admission (Addendum)
PMR Admission Coordinator Pre-Admission Assessment  Patient: [name] is an 86 y.o., female MRN: [id number] DOB: 06-26-1931 Height: _0  (154.9 cm) Weight: 66.4 kg              Insurance Information HMO:     PPO:      PCP:      IPA:      80/20:yes      OTHER:  PRIMARY: Medicare A & B      Policy#: [id number]      Subscriber: patient CM Name:       Phone#:      Fax#:  Pre-Cert#:       Employer:  Benefits:  Phone #: verified online via [employer] on 11/17/19     Name:  Eff. Date: Part A & B effective 01/25/1998     Deduct: $1,484      Out of Pocket Max: NA      Life Max: NA  CIR: 100% with Medicare approval      SNF: 100% days 1-20, 80% days 21-100 Outpatient: 80%    Co-Pay: 20% Home Health: 100%      Co-Pay:  DME: 80%     Co-Pay: 20% Providers: pt's choice SECONDARY:       Policy#:       Phone#:   Development worker, community:       Phone#:   The Therapist, art Information Summary" for patients in Inpatient Rehabilitation Facilities with attached "Privacy Act [place] Records" was provided and verbally reviewed with: Patient and Family  Emergency Contact Information Contact Information     Name Relation Home Work Mobile   [place] Spouse [phone]     Blaise, Palladino   [phone]   Gordy Savers Daughter   [phone]   Rande Lawman Daughter   [phone]   Swanson,Sidonie Daughter   [phone]      Current Medical History  Patient Admitting Diagnosis: Right PCA infarct  History of Present Illness: Pt is a 86 y.o. female with history of A fib/A flutter s/p ablation and off AC due to NSR, recent diagnosis of PNA who was admitted on 11/13/19 with left sided weakness and right gaze preference. CTA head negative and CTA head showed proximal R-PCA occlusion with emphysema and layering left pleural effusion c/w PNA. She underwent cerebral angio with revascularization of R-PCA by Dr. Earleen Newport. 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 She underwent ultrasound guided thrombin injection of R-CFA with good results.  Once off bedrest, therapy evaluations done revealing right lateral lean with fatigue, shot shuffling steps, has difficulty with memory, cognition as well as safety awareness. CIR recommended due to functional decline.   Complete NIHSS TOTAL: 2 Glasgow Coma Scale Score: 15  Past Medical History  Past Medical History:  Diagnosis Date   Atrial fibrillation ([place])    Prior treatment with amiodarone, held sinus rhythm and drug stopped   Atrial flutter ([place])    Status post ablation prior to  2009   Chest pain    Nuclear, 2004, no scar or ischemia, EF 77%   Ejection fraction    EF 70%, nuclear,  2004  //      Fall    July, 2014   H/O bladder repair surgery    Right Achilles tendinitis    Sciatica     Family History  family history includes Cancer in her father and mother; Heart disease in her brother.  Prior Rehab/Hospitalizations:  Has the  patient had prior rehab or hospitalizations prior to admission? No  Has the patient had major surgery during 100 days prior to admission? Yes  Current Medications   Current Facility-Administered Medications:     stroke: mapping our early stages of recovery book, , Does not apply, Once, Bhagat, Srishti L, MD   acetaminophen (TYLENOL) tablet 650 mg, 650 mg, Oral, Q4H PRN **OR** acetaminophen (TYLENOL) 160 MG/5ML solution 650 mg, 650 mg, Per Tube, Q4H PRN **OR** acetaminophen (TYLENOL) suppository 650 mg, 650 mg, Rectal, Q4H PRN, Bhagat, Srishti L, MD   amoxicillin-clavulanate (AUGMENTIN) 875-125 MG per tablet 1 tablet, 1 tablet, Oral, Q12H, Bhagat, Srishti L, MD, 1 tablet at 11/20/19 0824   apixaban (ELIQUIS) tablet 5 mg, 5 mg, Oral, BID, Rozell, Kettlewell, RPH, 5 mg at 11/20/19 0825   bisacodyl (DULCOLAX) suppository 10 mg, 10 mg, Rectal, Daily PRN, Rinehuls, David L, PA-C   chlorhexidine gluconate (MEDLINE KIT) (PERIDEX) 0.12 % solution 15 mL, 15 mL,  Mouth Rinse, BID, Anders Simmonds, MD, 15 mL at 11/20/19 0826   docusate sodium (COLACE) capsule 100 mg, 100 mg, Oral, BID, Rosalin Hawking, MD, 100 mg at 11/20/19 0825   labetalol (NORMODYNE) injection 5-20 mg, 5-20 mg, Intravenous, Q2H PRN, Rosalin Hawking, MD, 5 mg at 11/19/19 0452   ondansetron (ZOFRAN) injection 4 mg, 4 mg, Intravenous, Q6H PRN, Rosalin Hawking, MD   pantoprazole (PROTONIX) EC tablet 40 mg, 40 mg, Oral, Daily, Rosalin Hawking, MD, 40 mg at 11/20/19 0824   polyethylene glycol (MIRALAX / GLYCOLAX) packet 17 g, 17 g, Oral, Daily, Hicks, Samantha B, NP, 17 g at 11/20/19 1448   senna-docusate (Senokot-S) tablet 2 tablet, 2 tablet, Oral, QHS PRN, Bhagat, Srishti L, MD, 2 tablet at 11/17/19 1028  Patients Current Diet:  Diet Order             Diet Heart Room service appropriate? Yes with Assist; Fluid consistency: Thin  Diet effective 1000                   Precautions / Restrictions Precautions Precautions: Fall Precaution Comments: watch BP, dizziness Restrictions Weight Bearing Restrictions: No   Has the patient had 2 or more falls or a fall with injury in the past year?No  Prior Activity Level Community (5-7x/wk): drives; leaves house everyday  Prior Functional Level Prior Function Level of Independence: Independent Comments: reports independent and driving, managing her medication (takes care of her husbands meds too)  Self Care: Did the patient need help bathing, dressing, using the toilet or eating?  Independent  Indoor Mobility: Did the patient need assistance with walking from room to room (with or without device)? Independent  Stairs: Did the patient need assistance with internal or external stairs (with or without device)? Needed some help  Functional Cognition: Did the patient need help planning regular tasks such as shopping or remembering to take medications? Independent  Home Assistive Devices / Equipment Home Equipment: Cane - single point  Prior  Device Use: Indicate devices/aids used by the patient prior to current illness, exacerbation or injury?  cane  Current Functional Level Cognition  Arousal/Alertness: Awake/alert Overall Cognitive Status: Impaired/Different from baseline Current Attention Level: Sustained Orientation Level: Oriented X4 Following Commands: Follows one step commands with increased time Safety/Judgement: Decreased awareness of deficits, Decreased awareness of safety General Comments: pt AxOx4 but at end of session states " I can't believe this my bedroom." Attention: Sustained Sustained Attention: Impaired Sustained Attention Impairment: Verbal basic, Functional basic Memory: Impaired Memory Impairment:  Retrieval deficit, Decreased recall of new information, Decreased short term memory Decreased Short Term Memory: Verbal basic, Functional basic Awareness: Impaired Awareness Impairment: Emergent impairment Problem Solving: Impaired Problem Solving Impairment: Verbal complex, Functional complex Executive Function: Writer: Impaired Organizing Impairment: Verbal basic, Functional basic Safety/Judgment: Impaired Comments: Stated she could go home and "function close to normal"    Extremity Assessment (includes Sensation/Coordination)  Upper Extremity Assessment: LUE deficits/detail LUE Deficits / Details: decreased coordination, edema in UE  LUE Sensation: decreased light touch LUE Coordination: decreased fine motor, decreased gross motor  Lower Extremity Assessment: Defer to PT evaluation    ADLs  Overall ADL's : Needs assistance/impaired Grooming: Wash/dry face, Oral care, Brushing hair, Sitting, Set up Grooming Details (indicate cue type and reason): from recliner, pt able to sequence all ADLs tasks with no cues needed Upper Body Bathing: Minimal assistance, Sitting Lower Body Bathing: Minimal assistance, Sit to/from stand Upper Body Dressing : Minimal assistance, Sitting Lower Body  Dressing: Minimal assistance, Sit to/from stand Toilet Transfer: Minimal assistance, Moderate assistance, RW, Stand-pivot Toilet Transfer Details (indicate cue type and reason): simulated via functional mobility, MIN A for stand pivot transfer with RW, up to MOD A for balance d/t initial posterior lean in standing Functional mobility during ADLs: Minimal assistance, Rolling walker, Moderate assistance (stand pivot only) General ADL Comments: pt continues to present with decreased activity tolerance, generalized weakness and impaired balance impacting pts ability to complete BADLs    Mobility  Overal bed mobility: Needs Assistance Bed Mobility: Supine to Sit Supine to sit: Min assist, HOB elevated General bed mobility comments: MIN to elevate trunk into sitting and scoot hips to EOB using bed pad    Transfers  Overall transfer level: Needs assistance Equipment used: Rolling walker (2 wheeled) Transfers: Sit to/from Stand, Technical brewer Transfers Sit to Stand: Min assist, Mod assist Stand pivot transfers: Min assist General transfer comment: pt able to power up from EOB with MIN A but required up to MOD A for posterior LOB. pt able to pivot to recliner to pts R side with MIIN A needing increased time for c/o dizziness    Ambulation / Gait / Stairs / Wheelchair Mobility  Ambulation/Gait Ambulation/Gait assistance: Min assist, Mod assist Gait Distance (Feet): 10 Feet Assistive device: Rolling walker (2 wheeled) Gait Pattern/deviations: Step-through pattern, Decreased stride length, Shuffle, Trunk flexed General Gait Details: min-mod assist for steadying, physically maneuvering RW, multimodal cuing for placement in RW, upright posture, taking larger steps . Gait velocity: decr Gait velocity interpretation: <1.31 ft/sec, indicative of household ambulator    Posture / Balance Dynamic Sitting Balance Sitting balance - Comments: required at least one UE support with static  sitting Balance Overall balance assessment: Needs assistance Sitting-balance support: Single extremity supported, Feet supported Sitting balance-Leahy Scale: Poor Sitting balance - Comments: required at least one UE support with static sitting Postural control: Left lateral lean Standing balance support: Bilateral upper extremity supported Standing balance-Leahy Scale: Poor Standing balance comment: reliant on BUE support and external assist    Special needs/care consideration Skin ecchymosis: arm, groin/right; abrasion: right arm; surgical incision: groin, right/anterior, External urinary catheter and Designated visitor Rande Lawman, daughter; Kateri Mc, son; Kathryne Hitch, daughter; Gordy Savers, daughter     Previous Home Environment (from acute therapy documentation) Living Arrangements: Spouse/significant other  Lives With: Spouse Available Help at Discharge: Family, Available 24 hours/day Type of Home: House Home Layout: One level Home Access: Stairs to enter Entrance Stairs-Rails: Right Entrance Stairs-Number of Steps: 1  Bathroom Shower/Tub: Tub only, Multimedia programmer: Standard Bathroom Accessibility: Yes How Accessible: Accessible via walker [place]: No  Discharge Living Setting Plans for Discharge Living Setting: Patient's home Type of Home at Discharge: House Discharge Home Layout: One level Discharge Home Access: Stairs to enter Entrance Stairs-Rails: Right Entrance Stairs-Number of Steps: 1 Discharge Bathroom Shower/Tub: Tub only, Walk-in shower Discharge Bathroom Toilet: Standard Discharge Bathroom Accessibility: Yes How Accessible: Accessible via walker Does the patient have any problems obtaining your medications?: No  Social/Family/Support Systems Anticipated Caregiver: Sinclair Ship Anticipated Caregiver's Contact Information: Sherren Mocha: [phone], [name]: [phone], [place]: [phone],Andrea:  [phone] Caregiver Availability: 24/7 Discharge Plan Discussed with Primary Caregiver: Yes Is Caregiver In Agreement with Plan?: Yes Does Caregiver/Family have Issues with Lodging/Transportation while Pt is in Rehab?: No   Goals Patient/Family Goal for Rehab: PT/OT Supervision goals, No SLP goals Expected length of stay: 10-14 days. Cultural Considerations: None Pt/Family Agrees to Admission and willing to participate: Yes Program Orientation Provided & Reviewed with Pt/Caregiver Including Roles  & Responsibilities: Yes   Decrease burden of Care through IP rehab admission: NA   Possible need for SNF placement upon discharge: NA   Patient Condition: This patient's medical and functional status has changed since the consult dated: 11/19/19 in which the Rehabilitation Physician determined and documented that the patient's condition is appropriate for intensive rehabilitative care in an inpatient rehabilitation facility. See "History of Present Illness" (above) for medical update. Functional changes are: decline in functional ambulation status and transfer status due to recent procedure and bedrest orders (decline from Min/Mod A for transfers and Mod A for ambulation to Mod A +2 for transfers and an inability to tolerate gait). Patient's medical and functional status update has been discussed with the Rehabilitation physician and patient remains appropriate for inpatient rehabilitation. Will admit to inpatient rehab today.  Preadmission Screen Completed By:  Retta Diones, RN, 11/20/2019 11:43 AM ______________________________________________________________________   Discussed status with Dr. Posey Pronto on 11/23/19 at 10:46AM and received approval for admission today.  Admission Coordinator:  Retta Diones, with day of admit updates completed by Raechel Ache, OTR/L at time 10:46AM/Date 11/23/19.

## 2019-11-20 ENCOUNTER — Inpatient Hospital Stay (HOSPITAL_COMMUNITY): Payer: Medicare Other

## 2019-11-20 DIAGNOSIS — I724 Aneurysm of artery of lower extremity: Secondary | ICD-10-CM

## 2019-11-20 DIAGNOSIS — I63531 Cerebral infarction due to unspecified occlusion or stenosis of right posterior cerebral artery: Secondary | ICD-10-CM | POA: Diagnosis not present

## 2019-11-20 MED ORDER — IOHEXOL 350 MG/ML SOLN
100.0000 mL | Freq: Once | INTRAVENOUS | Status: AC | PRN
  Administered 2019-11-20: 100 mL via INTRAVENOUS

## 2019-11-20 NOTE — Care Management Important Message (Signed)
Important Message  Patient Details  Name: [name] MRN: [id number] Date of Birth: September 14, 1931   Medicare Important Message Given:  Yes     Orbie Pyo 11/20/2019, 2:26 PM

## 2019-11-20 NOTE — Progress Notes (Signed)
Referring Physician(s): Code stroke- Bhagat, [place] (neurology)  Supervising Physician: Corrie Mckusick  Patient Status:  [facility] - In-pt  Chief Complaint: "Groin pain"  Subjective:  History of acute CVA s/p cerebral arteriogram with emergent mechanical thrombectomy of right PCA occlusion achieving a TICI 3 revascularization via right femoral approach 11/14/2019 by Dr. Earleen Newport; found to have pseudoaneurysm of right femoral puncture site s/p thrombin injection 11/16/2019 by Dr. Earleen Newport. Patient awake and alert sitting in chair. States right groin painful to touch only. When asked if she notes increased swelling in area replies "that's what they tell me". No additional complaints. RN at bedside.   Allergies: Bee venom, Mixed vespid venom, Codeine, Sulfa antibiotics, and Erythromycin  Medications: Prior to Admission medications   Medication Sig Start Date End Date Taking? Authorizing Provider  amoxicillin-clavulanate (AUGMENTIN) 875-125 MG tablet Take 1 tablet by mouth 2 (two) times daily. 11/12/19  Yes [provider]  aspirin 81 MG tablet Take 1 tablet (81 mg total) by mouth daily. 01/08/13  Yes Carlena Bjornstad, MD  atorvastatin (LIPITOR) 10 MG tablet Take 10 mg by mouth daily.   Yes [provider]  BIOTIN PO Take 1 tablet by mouth daily.    Yes [provider]  CALCIUM PO Take 1 tablet by mouth daily.    Yes [provider]  Cholecalciferol (VITAMIN D3) 50 MCG (2000 UT) CAPS Take 2,000 Units by mouth daily.   Yes [provider]  Coenzyme Q10 (CO Q-10 PO) Take 1 tablet by mouth daily.    Yes [provider]  metoprolol succinate (TOPROL-XL) 50 MG 24 hr tablet Take 50 mg by mouth daily. Take with or immediately following a meal.   Yes [provider]  EPINEPHrine 0.3 mg/0.3 mL IJ SOAJ injection Inject 0.3 mg into the muscle as needed for anaphylaxis.  08/04/18   [provider]  Investigational - Study Medication  Additional Study Details    [provider]  trimethoprim (TRIMPEX) 100 MG tablet Take 100 mg by mouth as directed. Patient not taking: Reported on 11/14/2019    [provider]     Vital Signs: BP (!) 154/69 (BP Location: Right Arm)   Pulse 94   Temp 99.3 F (37.4 C) (Oral)   Resp 18   Ht 5\' 1"  (1.549 m)   Wt 146 lb 6.2 oz (66.4 kg)   LMP  (LMP Unknown)   SpO2 95%   BMI 27.66 kg/m   Physical Exam Vitals and nursing note reviewed.  Constitutional:      General: She is not in acute distress. Pulmonary:     Effort: Pulmonary effort is normal. No respiratory distress.  Skin:    General: Skin is warm and dry.     Comments: Right femoral puncture site soft, moderate ecchymosis of medial thigh (medial to puncture site) with tenderness to palpation (actual puncture site non-tender), no active bleeding or hematoma noted.   Neurological:     Mental Status: She is alert and oriented to person, place, and time.     Imaging: US Guided Needle Placement  Result Date: 11/16/2019 INDICATION: 86 year old female with a history of right common femoral artery pseudoaneurysm status post access for mechanical thrombectomy 11/14/2019 EXAM: ULTRASOUND-GUIDED NEEDLE PLACEMENT RIGHT COMMON FEMORAL ARTERY PSEUDOANEURYSM IMAGING GUIDED THROMBIN INJECTION MEDICATIONS: 3500 units thrombin ANESTHESIA/SEDATION: None Patient's level of consciousness and vital signs were monitored continuously by radiology nursing throughout the procedure under my direct supervision. FLUOROSCOPY TIME:  Ultrasound COMPLICATIONS: None  PROCEDURE: Informed written consent was obtained from the patient after a thorough discussion of the procedural risks, benefits and alternatives. All questions were addressed. Maximal Sterile Barrier Technique was utilized including caps, mask, sterile gowns, sterile gloves, sterile drape, hand hygiene and skin antiseptic. A timeout was performed prior to the initiation of the  procedure. Patient was positioned supine position on the ultrasound stretcher. Ultrasound images of the right inguinal region were performed with images stored sent to PACs. The patient was then prepped and draped in the usual sterile fashion. 1% lidocaine was used for local anesthesia. 5000 units of bovine thrombin was reconstituted in the proprietary package. A 22 gauge spinal needle was used to access the dome of the pseudoaneurysm with ultrasound guidance, at a site as far distal from the perceived neck as possible, on the inferior aspect. A 6 inch tubing was then attached to the needle after the stylet was removed with arterial blood return. A 6 inch tubing set was attached to a 3 way stopcock. 5 cc/5000 units of bovine reconstituted thrombin were drawn into a 5 cc syringe, attached to 1 of the side arm of the stopcock. The other side arm excepted a 3 cc syringe. Under ultrasound observation, small aliquots of the thrombin were injected using the 3 cc syringe. After approximately 2000 units were injected, we paused for observation. After we observed 2 minutes there was continuous flow in the dome. Upon attempted further injection the needle was occluded. This needle was removed and a new spinal needle was placed. Stylet was removed confirming arterial blood return. The short tubing set was reattached and additional 1500 units were slowly injected in small aliquots under ultrasound observation. Ultimately we achieved stagnant flow with thrombosis of the pseudoaneurysm. Needle was removed and final images were stored. We confirmed positive maintain Doppler signal in both the dorsalis pedis and posterior tibial at the right foot. Foot remains warm. No complications were encountered and no significant blood loss. Sterile dressing was placed. 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 Signed, Dulcy Fanny. Dellia Nims, RPVI Vascular and  Interventional Radiology Specialists [facility] Radiology Electronically Signed   By: Corrie Mckusick D.O.   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 FLUOROSCOPY TIME:  Ultrasound COMPLICATIONS: None PROCEDURE: Informed written consent was obtained from the patient after a thorough discussion of the procedural risks, benefits and alternatives. All questions were addressed. Maximal Sterile Barrier Technique was utilized including caps, mask, sterile gowns, sterile gloves, sterile drape, hand hygiene and skin antiseptic. A timeout was performed prior to the initiation of the procedure. Patient was positioned supine position on the ultrasound stretcher. Ultrasound images of the right inguinal region were performed with images stored sent to PACs. The patient was then prepped and draped in the usual sterile fashion. 1% lidocaine was used for local anesthesia. 5000 units of bovine thrombin was reconstituted in the proprietary package. A 22 gauge spinal needle was used to access the dome of the pseudoaneurysm with ultrasound guidance, at a site as far distal from the perceived neck as possible, on the  inferior aspect. A 6 inch tubing was then attached to the needle after the stylet was removed with arterial blood return. A 6 inch tubing set was attached to a 3 way stopcock. 5 cc/5000 units of bovine reconstituted thrombin were drawn into a 5 cc syringe, attached to 1 of the side arm of the stopcock. The other side  arm excepted a 3 cc syringe. Under ultrasound observation, small aliquots of the thrombin were injected using the 3 cc syringe. After approximately 2000 units were injected, we paused for observation. After we observed 2 minutes there was continuous flow in the dome. Upon attempted further injection the needle was occluded. This needle was removed and a new spinal needle was placed. Stylet was removed confirming arterial blood return. The short tubing set was reattached and additional 1500 units were slowly injected in small aliquots under ultrasound observation. Ultimately we achieved stagnant flow with thrombosis of the pseudoaneurysm. Needle was removed and final images were stored. We confirmed positive maintain Doppler signal in both the dorsalis pedis and posterior tibial at the right foot. Foot remains warm. No complications were encountered and no significant blood loss. Sterile dressing was placed. 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 Signed, Dulcy Fanny. Dellia Nims, RPVI Vascular and Interventional Radiology Specialists [facility] Radiology Electronically Signed   By: Corrie Mckusick D.O.   On: 11/16/2019 15:39   VAS [place] GROIN PSEUDOANEURYSM  Result Date: 11/18/2019  ARTERIAL PSEUDOANEURYSM  Exam: follow up thrombin injection Comparison Study: previous 11/16/19 Performing Technologist: Abram Sander RVS  Examination Guidelines: A complete evaluation includes B-mode imaging, spectral Doppler, color Doppler, and power Doppler as needed of all accessible portions of each vessel. Bilateral testing is considered an integral part of a complete examination. Limited examinations for reoccurring indications may be performed as noted. +------------+----------+---------+------+----------+ Right DuplexPSV (cm/s)Waveform PlaqueComment(s) +------------+----------+---------+------+----------+ CFA            104    triphasic                  +------------+----------+---------+------+----------+ Prox SFA       141    triphasic                 +------------+----------+---------+------+----------+  Summary: No evidence of pseudoaneurysm, AVF or DVT  Diagnosing physician: Harold Barban MD Electronically signed by Harold Barban MD on 11/18/2019 at 3:16:17 PM.   --------------------------------------------------------------------------------    Final    VAS [place] GROIN PSEUDOANEURYSM  Result Date: 11/16/2019  ARTERIAL PSEUDOANEURYSM  Exam: Right groin Indications: Patient complains of groin pain. History: S/p catheterization. Comparison Study: No prior studies. Performing Technologist: Darlin Coco, RDMS  Examination Guidelines: A complete evaluation includes B-mode imaging, spectral Doppler, color Doppler, and power Doppler as needed of all accessible portions of each vessel. Bilateral testing is considered an integral part of a complete examination. Limited examinations for reoccurring indications may be performed as noted. +------------+----------+---------+------+----------+ Right DuplexPSV (cm/s)Waveform PlaqueComment(s) +------------+----------+---------+------+----------+ CFA            130    triphasic                 +------------+----------+---------+------+----------+ PFA             63    biphasic                  +------------+----------+---------+------+----------+ Prox SFA        80    triphasic                 +------------+----------+---------+------+----------+  Right Vein comments:Patent with normal flow  Findings: An area with well defined borders measuring 2.0 cm x 2.4 cm was visualized arising off of the CFA distal with ultrasound characteristics of a pseudoaneurysm. The neck measures approximately 0.3 cm wide and 0.9 cm long. A mixed echogenic structure is visualized at the medial to CFV and CFA with ultrasound characteristics of a hematoma.  Diagnosing physician: Servando Snare MD  Electronically signed by Servando Snare MD on 11/16/2019 at 1:09:11 PM.   --------------------------------------------------------------------------------    Final     Labs:  CBC: Recent Labs    11/16/19 1206 11/17/19 0220 11/18/19 0248 11/19/19 0401  WBC 7.3 9.2 10.0 8.1  HGB 8.8* 9.1* 8.4* 8.5*  HCT 27.0* 28.3* 25.9* 26.6*  PLT 267 283 287 314    COAGS: Recent Labs    11/13/19 2244  INR 1.0  APTT 31    BMP: Recent Labs    11/16/19 1206 11/17/19 0220 11/18/19 0248 11/19/19 0401  NA 138 139 138 137  K 3.8 4.0 3.8 3.4*  CL 105 105 105 103  CO2 27 27 24 25   GLUCOSE 119* 117* 108* 108*  BUN 6* 5* 7* 8  CALCIUM 7.9* 8.1* 8.0* 7.9*  CREATININE 0.71 0.66 0.71 0.79  GFRNONAA >60 >60 >60 >60    LIVER FUNCTION TESTS: Recent Labs    11/13/19 2244 11/15/19 0408  BILITOT 0.8 0.9  AST 63* 24  ALT 61* 32  ALKPHOS 35 37*  PROT 6.2* 5.1*  ALBUMIN 2.8* 2.4*    Assessment and Plan:  History of acute CVA s/p cerebral arteriogram with emergent mechanical thrombectomy of right PCA occlusion achieving a TICI 3 revascularization via right femoral approach 11/14/2019 by Dr. Earleen Newport; found to have pseudoaneurysm of right femoral puncture site s/p thrombin injection 11/16/2019 by Dr. Earleen Newport. Patient's conditionimproving- awake and alert, following simple commands, moving all extremities. Right femoral puncture sitestable s/p thrombin injection. Still with tenderness of site secondary to prior hematoma- recommend alternating between ice packs/warm packs along with ace wrap compression of thigh. Will obtain CTA pelvis per Dr. Earleen Newport for further evaluation. Plan to follow-up with Dr. Earleen Newport in clinic 4 weeks after discharge (our schedulers to call patient to set up this appointment). Further plans per neurology- appreciate and agree with management. NIR to follow.   Electronically Signed: Earley Abide, PA-C 11/20/2019, 9:48 AM   I spent a total of 25 Minutes at the the  patient's bedside AND on the patient's hospital floor or unit, greater than 50% of which was counseling/coordinating care for CVA s/p revascularization, right femoral puncture pain.

## 2019-11-20 NOTE — Progress Notes (Signed)
PT Cancellation Note  Patient Details Name: GENEVE KIMPEL MRN: [id number] DOB: Nov 04, 1931   Cancelled Treatment:    Reason Eval/Treat Not Completed: Patient not medically ready - Pt awaiting surgical repair of L femoral artery pseudoaneurysm, pt presenting with significant groin pain. PT to check back post-procedure.   [place] Pager [phone]  Office [phone]     Roxine Caddy D Elonda Husky 11/20/2019, 2:52 PM

## 2019-11-20 NOTE — Consult Note (Addendum)
REASON FOR CONSULT:    Right femoral artery pseudoaneurysm.  The consult is requested by Dr. Corrie Mckusick.   ASSESSMENT & PLAN:   RECURRENT RIGHT FEMORAL ARTERY PSEUDOANEURYSM: This patient had developed a right femoral artery pseudoaneurysm which was injected 4 days ago.  She developed recurrence while on Eliquis.  She took her last dose this morning.  I have discussed the case with Dr. Earleen Newport and we agree that it would be worth another attempt at thrombin injection when she has been off her Eliquis.  He was considering doing this tomorrow afternoon.  If this is not successful then I would recommend surgical repair of the pseudoaneurysm.  The main risk associated with this is wound healing problems.  Given that the pseudoaneurysm is somewhat tender I do not think she is a good candidate for duplex directed manual occlusion.  Likewise as she is on Eliquis I do not think observation is a good option.  We will try to be available on Thursday for surgical repair if thrombin injection tomorrow afternoon is not successful.  I have discussed all these options with the patient and her daughter.  Of note she is on Eliquis and she took her last dose this morning.  Thus if we were to operate we would not be able to operate until Thursday.  Interventional radiology will attempt thrombin injection tomorrow afternoon.   Deitra Mayo, MD Office: [phone]   HPI:   [name] is a pleasant 86 y.o. female, who had presented with an acute stroke.  She underwent emergent cerebral arteriography with mechanical thrombectomy of the right PCA which was occluded.  This was on 11/14/2019.  She developed a pseudoaneurysm in the right groin and this was injected on 11/16/2019.  The patient was continuing to have some right groin pain and so today a CT angiogram of the pelvis was ordered.  Currently the groin is not tender and less someone pushes on the pseudoaneurysm.  She denies any history of claudication,  rest pain, or nonhealing ulcers.  She has no real risk factors for peripheral vascular disease.  She denies any history of diabetes, hypertension, hypercholesterolemia, family history of premature cardiovascular disease, or tobacco use.  Past Medical History:  Diagnosis Date  . Atrial fibrillation ([place])    Prior treatment with amiodarone, held sinus rhythm and drug stopped  . Atrial flutter ([place])    Status post ablation prior to  2009  . Chest pain    Nuclear, 2004, no scar or ischemia, EF 77%  . Ejection fraction    EF 70%, nuclear,  2004  //     . Fall    July, 2014  . H/O bladder repair surgery   . Right Achilles tendinitis   . Sciatica     Family History  Problem Relation Age of Onset  . Cancer Mother   . Cancer Father        pancreatic cancer  . Heart disease Brother     SOCIAL HISTORY: Social History   Socioeconomic History  . Marital status: Married    Spouse name: Not on file  . Number of children: Not on file  . Years of education: Not on file  . Highest education level: Not on file  Occupational History  . Not on file  Tobacco Use  . Smoking status: Former Research scientist (life sciences)  . Smokeless tobacco: Never Used  Vaping Use  . Vaping Use: Never used  Substance and Sexual Activity  . Alcohol use: Yes  .  Drug use: Never  . Sexual activity: Not on file  Other Topics Concern  . Not on file  Social History Narrative  . Not on file   Social Determinants of Health   Financial Resource Strain:   . Difficulty of Paying Living Expenses: Not on file  Food Insecurity:   . Worried About Charity fundraiser in the Last Year: Not on file  . Ran Out of Food in the Last Year: Not on file  Transportation Needs:   . Lack of Transportation (Medical): Not on file  . Lack of Transportation (Non-Medical): Not on file  Physical Activity:   . Days of Exercise per Week: Not on file  . Minutes of Exercise per Session: Not on file  Stress:   . Feeling of Stress : Not on file  Social  Connections:   . Frequency of Communication with Friends and Family: Not on file  . Frequency of Social Gatherings with Friends and Family: Not on file  . Attends Religious Services: Not on file  . Active Member of Clubs or Organizations: Not on file  . Attends Archivist Meetings: Not on file  . Marital Status: Not on file  Intimate Partner Violence:   . Fear of Current or Ex-Partner: Not on file  . Emotionally Abused: Not on file  . Physically Abused: Not on file  . Sexually Abused: Not on file    Allergies  Allergen Reactions  . Bee Venom Anaphylaxis  . Mixed Vespid Venom Anaphylaxis  . Codeine Other (See Comments)    Not known  . Sulfa Antibiotics Itching and Nausea Only  . Erythromycin Nausea And Vomiting    Current Facility-Administered Medications  Medication Dose Route Frequency Provider Last Rate Last Admin  .  stroke: mapping our early stages of recovery book   Does not apply Once Bhagat, Srishti L, MD      . acetaminophen (TYLENOL) tablet 650 mg  650 mg Oral Q4H PRN Bhagat, Srishti L, MD       Or  . acetaminophen (TYLENOL) 160 MG/5ML solution 650 mg  650 mg Per Tube Q4H PRN Bhagat, Srishti L, MD       Or  . acetaminophen (TYLENOL) suppository 650 mg  650 mg Rectal Q4H PRN Bhagat, Srishti L, MD      . amoxicillin-clavulanate (AUGMENTIN) 875-125 MG per tablet 1 tablet  1 tablet Oral Q12H Bhagat, Srishti L, MD   1 tablet at 11/20/19 0824  . apixaban (ELIQUIS) tablet 5 mg  5 mg Oral BID Charice, Zuno, RPH   5 mg at 11/20/19 0825  . bisacodyl (DULCOLAX) suppository 10 mg  10 mg Rectal Daily PRN Rinehuls, David L, PA-C      . chlorhexidine gluconate (MEDLINE KIT) (PERIDEX) 0.12 % solution 15 mL  15 mL Mouth Rinse BID Anders Simmonds, MD   15 mL at 11/20/19 0826  . docusate sodium (COLACE) capsule 100 mg  100 mg Oral BID Rosalin Hawking, MD   100 mg at 11/20/19 0825  . labetalol (NORMODYNE) injection 5-20 mg  5-20 mg Intravenous Q2H PRN Rosalin Hawking, MD   5 mg at  11/19/19 0452  . ondansetron (ZOFRAN) injection 4 mg  4 mg Intravenous Q6H PRN Rosalin Hawking, MD      . pantoprazole (PROTONIX) EC tablet 40 mg  40 mg Oral Daily Rosalin Hawking, MD   40 mg at 11/20/19 0824  . polyethylene glycol (MIRALAX / GLYCOLAX) packet 17 g  17 g Oral  Daily Paulita Fujita B, NP   17 g at 11/20/19 4562  . senna-docusate (Senokot-S) tablet 2 tablet  2 tablet Oral QHS PRN Lorenza Chick, MD   2 tablet at 11/17/19 1028    REVIEW OF SYSTEMS:  _0  denotes positive finding, _1  denotes negative finding Cardiac  Comments:  Chest pain or chest pressure:    Shortness of breath upon exertion:    Short of breath when lying flat:    Irregular heart rhythm:        Vascular    Pain in calf, thigh, or hip brought on by ambulation:    Pain in feet at night that wakes you up from your sleep:     Blood clot in your veins:    Leg swelling:         Pulmonary    Oxygen at home:    Productive cough:     Wheezing:         Neurologic    Sudden weakness in arms or legs:     Sudden numbness in arms or legs:     Sudden onset of difficulty speaking or slurred speech:    Temporary loss of vision in one eye:     Problems with dizziness:         Gastrointestinal    Blood in stool:     Vomited blood:         Genitourinary    Burning when urinating:     Blood in urine:        Psychiatric    Major depression:         Hematologic    Bleeding problems:    Problems with blood clotting too easily:        Skin    Rashes or ulcers:        Constitutional    Fever or chills:     PHYSICAL EXAM:   Vitals:   11/20/19 0408 11/20/19 0745 11/20/19 1034 11/20/19 1254  BP: (!) 150/69 (!) 154/69 (!) 108/50 135/72  Pulse: 79 94 76 84  Resp: _2 Temp: 99.1 F (37.3 C) 99.3 F (37.4 C) 98.4 F (36.9 C) 98.8 F (37.1 C)  TempSrc: Oral Oral Oral Oral  SpO2: 94% 95% 95% 95%  Weight:      Height:        GENERAL: The patient is a well-nourished female, in no acute distress.  The vital signs are documented above. CARDIAC: There is a regular rate and rhythm.  VASCULAR: I do not detect carotid bruits. She has moderate swelling in the right groin below the inguinal crease.  There are some tenderness to palpation. She has palpable pedal pulses. PULMONARY: There is good air exchange bilaterally without wheezing or rales. ABDOMEN: Soft and non-tender with normal pitched bowel sounds.  MUSCULOSKELETAL: There are no major deformities or cyanosis. NEUROLOGIC: No focal weakness or paresthesias are detected. SKIN: There are no ulcers or rashes noted. PSYCHIATRIC: The patient has a normal affect.  DATA:    CT ANGIOGRAM PELVIS: I reviewed the images of the CT angiogram of the pelvis.  This shows a recurrent pseudoaneurysm in the right groin.  The aneurysm is anterior to the artery with a narrow neck.  It appears to be near the bifurcation of the common femoral artery.  Based on the CT scan I think it does appear to be a good aneurysm for attempted thrombin injection again.  I discussed this with Dr. Earleen Newport.  LABS: Her hemoglobin yesterday was 8.5.  GFR is greater than 60.  Platelets 314,000.

## 2019-11-20 NOTE — Progress Notes (Signed)
NIR.  History of acute CVA s/p cerebral arteriogram with emergent mechanical thrombectomy of right PCA occlusion achieving a TICI 3 revascularization via right femoral approach 11/14/2019 by Dr. Earleen Newport; found to have pseudoaneurysm of right femoral puncture site s/p thrombin injection 11/16/2019 by Dr. Earleen Newport.  Patient with pain/swelling of right femoral puncture site. CTA pelvis today revealed right femoral pseudoaneurysm reoccurrence. Dr. Earleen Newport reviewed with Dr. Scot Dock who saw patient today- does not recommend surgical repair at this time, as patient is currently on Eliquis. Recommend repeat thrombin injection tomorrow in IR followed by repeat US- if pseudoaneurysm still present following thrombin injection plan for surgical repair on Thursday 11/22/2019. Plan for image-guided right femoral thrombin injection in IR tomorrow 11/21/2019. Ok to restart patient's diet at this time- no need for NPO prior to IR procedure. Recommend bedrest along with holding Eliquis prior to IR procedure tomorrow.  Further plans per neurology- appreciate and agree with management. NIR to follow.  Risks and benefits discussed with the patient including, but not limited to bleeding, infection, or damage to adjacent structures. All of the patient's questions were answered, patient is agreeable to proceed. Consent signed and in IR control room.   Melissa Graff Morry Veiga, PA-C 11/20/2019, 3:44 PM

## 2019-11-20 NOTE — Progress Notes (Signed)
IP rehab admissions - I met with patient and her daughter at the bedside.  They continue to want inpatient rehab stay.  Noted problems with pseudoaneurysm/hematoma today.  Will follow up for medical readiness and bed availability tomorrow.  Call for questions.  [phone]

## 2019-11-20 NOTE — Progress Notes (Signed)
STROKE TEAM PROGRESS NOTE   INTERVAL HISTORY Patient with tender R groin. RN notified IR. Also with dizziness, nausea followed by vomiting after toileting this am, felt to be d/t pain.  Will hold Eliquis at this time.  CT angiogram of the abdomen pelvis shows recurrent pseudoaneurysm in the right groin.  Interventional radiology and vascular surgery have been consulted.  Plan is to hold Eliquis and try thrombin injection again and if it fails patient will need surgery on Thursday.  Discussed with patient daughter at the bedside.  Vital signs are stable.  Neurological exam is unchanged.  Hematocrit stable at 8.5 yesterday and today is pending  OBJECTIVE Vitals:   11/20/19 0408 11/20/19 0745 11/20/19 1034 11/20/19 1254  BP: (!) 150/69 (!) 154/69 (!) 108/50 135/72  Pulse: 79 94 76 84  Resp: 19 18 16 18   Temp: 99.1 F (37.3 C) 99.3 F (37.4 C) 98.4 F (36.9 C) 98.8 F (37.1 C)  TempSrc: Oral Oral Oral Oral  SpO2: 94% 95% 95% 95%  Weight:      Height:       CBC:  Recent Labs  Lab 11/13/19 2244 11/13/19 2248 11/18/19 0248 11/19/19 0401  WBC 6.3   < > 10.0 8.1  NEUTROABS 3.8  --   --   --   HGB 12.8   < > 8.4* 8.5*  HCT 40.4   < > 25.9* 26.6*  MCV 100.0   < > 97.4 98.5  PLT 343   < > 287 314   < > = values in this interval not displayed.   Basic Metabolic Panel:  Recent Labs  Lab 11/15/19 0408 11/16/19 1206 11/18/19 0248 11/19/19 0401  NA 140   < > 138 137  K 3.9   < > 3.8 3.4*  CL 110   < > 105 103  CO2 22   < > 24 25  GLUCOSE 92   < > 108* 108*  BUN 7*   < > 7* 8  CREATININE 0.71   < > 0.71 0.79  CALCIUM 7.9*   < > 8.0* 7.9*  MG 1.9  --   --   --    < > = values in this interval not displayed.    IMAGING CT ANGIO PELVIS W OR WO CONTRAST  Result Date: 11/20/2019 CLINICAL DATA:  86 year old female with a history of mechanical thrombectomy for posterior circulation emergent large vessel occlusion 11/14/2019, with delayed hematoma and pseudoaneurysm of the access  site. This was treated with thrombin injection with resolution 11/16/2019, now with apparent recurrent symptoms. EXAM: CTA PELVIS WITH AND WITHOUT CONTRAST TECHNIQUE: Spiral CT of the pelvis was performed before administration of IV contrast. Multidetector CTA imaging of the pelvis was then performed following the standard protocol following the bolus administration of intravenous contrast. Reformatted images in coronal and sagittal planes performed on a separate workstation. CONTRAST:  132mL OMNIPAQUE IOHEXOL 350 MG/ML SOLN COMPARISON:  Ultrasound 11/16/2019 FINDINGS: VASCULAR: Mild atherosclerotic changes of the lower infrarenal abdominal aorta. There is incidental imaging of an accessory left renal artery to the lower pole segment from the lower abdominal aorta. Right lower extremity: Moderate atherosclerotic changes with irregular plaque of the right common iliac artery. Greatest diameter measures 15 mm. Hypogastric artery is patent. External iliac artery patent with minimal tortuosity. No high-grade stenosis or occlusion. No significant atherosclerotic changes of the common femoral artery. There is a rounded outpouching from the distal common femoral artery measuring 19 mm by 18 mm. Intermediate  density on the pre contrast CT image, with relatively uniform filling after the contrast bolus, just less than the density of the adjacent blood pool. A narrow neck to the common femoral artery is presumed with no obvious wide neck. Proximal profunda femoris and SFA patent. Left lower extremity: Mild irregular atherosclerotic plaque of the left common iliac artery, including a chronic appearing dissection flap, non flow limiting in the common iliac artery terminating above the bifurcation. Greatest diameter measures 13 mm. No inflammatory changes. Mild tortuosity. Hypogastric artery is patent. Mild tortuosity of the external iliac artery with minimal atherosclerosis. Common femoral artery patent with minimal  atherosclerosis. Proximal profunda femoris and SFA patent. Venous: Unremarkable appearance of the venous system. NONVASCULAR: Unremarkable appearance of the visualized GI system. Urinary bladder partially distended. Changes of hysterectomy. Degenerative changes of the lower lumbar spine. Unremarkable bilateral hips. Hematoma of the proximal left thigh estimated 7.3 cm x 3.9 cm on axial images, 9.1 cm on coronal images. IMPRESSION: CT angiogram confirms patent right common femoral artery pseudoaneurysm with narrow neck, estimated 19 mm x 18 mm, and associated right thigh hematoma. Aortic atherosclerosis and atherosclerotic changes of the bilateral iliac arteries. Signed, Dulcy Fanny. Dellia Nims, RPVI [facility] Electronically Signed   By: Corrie Mckusick D.O.   On: 11/20/2019 14:28     PHYSICAL EXAM     Temp:  [98.4 F (36.9 C)-99.4 F (37.4 C)] 98.8 F (37.1 C) (10/26 1254) Pulse Rate:  [76-94] 84 (10/26 1254) Resp:  [16-19] 18 (10/26 1254) BP: (108-154)/(50-82) 135/72 (10/26 1254) SpO2:  [94 %-98 %] 95 % (10/26 1254)  General - Well nourished, well developed, no acute distress.  Ophthalmologic - fundi not visualized due to noncooperation.  Cardiovascular - irregularly irregular heart rate and rhythm. Right groin shows tense hematoma which is tender to touch  Mental Status -  Level of arousal and orientation to place, age, month and person were intact, however, not orientated to year. Language exam showed no aphasia, able to name and repeat, follow simple commands.    Cranial Nerves II - XII - II - Visual field intact OU. III, IV, VI - Extraocular movements intact. V - Facial sensation intact bilaterally. VII - subtle left nasolabial fold flattening. VIII - Hearing & vestibular intact bilaterally. X - Palate elevates symmetrically. XI - Chin turning & shoulder shrug intact bilaterally. XII - Tongue protrusion intact.  Motor  Strength - The patient's strength was normal in right upper and lower extremities, left upper extremity mildly decreased finger grip, left LE 4/5 proximal and 4+/5 distally.  Bulk was normal and fasciculations were absent. Right lower extremity movements slightly limited due to hip pain Motor Tone - Muscle tone was assessed at the neck and appendages and was normal.  Reflexes - The patient's reflexes were symmetrical in all extremities and she had no pathological reflexes.  Sensory - Light touch, temperature/pinprick were assessed and were symmetrical.    Coordination - The patient had normal movements in the hands with no ataxia or dysmetria.  Tremor was absent.  Gait and Station - able to stand with assistance off of the commode, hold on to walker, and pivot to sit in chair.   ASSESSMENT/PLAN Ms. SALIHAH PECKHAM is a 86 y.o. female with history of Afib/flutter s/p ablation, off AC due to persistent sinus rhythm, and a recent diagnosis of pneumonia (she was not improving on the first round of antibiotics tried and therefore was broadened to Augmentin  on Monday), presenting with acute onset right gaze preference and left-sided weakness. The patient received IV t-PA Tuesday 11/19 at 10:59 PM and sent to IR for R PCA occlusion.  Stroke: R thalamic, cerebellar, and frontal small infarcts with right PCA occlusion s/p tPA and IR with TICI3 from AF not on AC Small SAH interpeduncular cistern post IR  CT Head - Age indeterminate small vessel disease including in the right thalamus, but no acute cortically based infarct or acute intracranial hemorrhage identified.   CTA H&N - Positive for occlusion of the Right PCA origin. No other large vessel occlusion, and generally mild for age atherosclerosis in the head and neck.   Cerebral angio/ IR - Rt PCA occlusion with TICI 3 flow   CT head - Status post Right PCA endovascular revascularization with trace subarachnoid blood/contrast in the interpeduncular  cistern. Small 10 mm area of contrast staining versus petechial hemorrhage in the medial right thalamus.   MRI head - R thalamus infarct with associated mild hemorrhage as noted on recent CT. There is also mild subarachnoid hemorrhage in the interpeduncular cistern unchanged from CT. Additional small areas of acute infarct in the right cerebellum and right frontal cortex. Moderate atrophy   MRA head -  Right PCA patent post thrombectomy. There is a mild stenosis in the right PCA at the P1/ P2 junction.   2D Echo - EF 60-65%. No source of embolus. LA moderately dilated  [employer] Virus 2 - negative  LDL - 26  HgbA1c - 5.7  VTE prophylaxis - Heparin 5000 units sq tid   aspirin 81 mg daily prior to admission, now on Eliquis-> will put Eliquis on hold d/t return of pseudoaneurysm   Therapy recommendations:  HH OT, HH PT ->  CIR   Disposition:  CIR  R Groin Hematoma  Developed hospital D#2 at angio puncture site  Groin pseudoaneurysm [place] 2.0 x 2.4 R CFA pseudoaneurysm w/ hematoma  Thrombin injection 10/22 Earleen Newport)  Repeat US 10/23 - no evidence of pseudoaneurysm   R groin tender this am after putting on  Eliquis 10/24  CTA pelvis 10/26 confirms R common femoral artery pseudoaneurysm with narrow neck, estimated 19 mm x 18 mm, and associated right thigh hematoma.  VVS Scot Dock) to see pt - surgical repair vs repeat thrombin injection  Acute Respiratory Failure, resolved  Intubated for IR, left intubated post IR   Sedated w/ fentanyl and propofol, now off  Extubated 10/20  Patient stable  CCM signed off   Atrial Fibrillation/Aflutter  Home anticoagulation:  none   post ablation Dr. Ashley Mariner 2004. Doing very well. No further recurrences.  Initially no AC given post tPA small SAH   Now off AC d/t recurrent R groin pseudoaneurysm  Follows with Dr. Marlou Porch at [place] [facility]  No response to intial abx  Broadened as OP to Augmention  10/18  Still has intermittent coughing  CTA - layering left pleural effusion, patchy left upper lobe opacity compatible with pneumonia  CXR 10/20 - small bilateral pleural effusions. Probable chronic interstitial changes.  WBCs 8.1 (afebrile)  Hypertension  Home BP meds: metoprolol   Treated short term w/ cleviprex, off   BP stable . BP goal < 160  . Long-term BP goal normotensive  Hyperlipidemia  Home Lipid lowering medication: Lipitor 10 mg daily  LDL 26, goal < 70  Will not resume lipitor due to low LDL level  Dysphagia, resolved . Secondary to stroke . Cleared for  regular consistency, thin liquid diet . Off IVF  . Sore throat after extubation   Other Stroke Risk Factors  Advanced age  Former cigarette smoker - quit  ETOH use, advised to drink no more than 1 alcoholic beverage per day.  Other Active Problems  Code status - Full code  Emphysema (HVF47-B40.9)   Aortic Atherosclerosis (ICD10-I70.0)   Anemia - Hgb - 12.8->13.3-> 10.5->9.8->8.8->9.1->8.4->8.5 - repeat am  Hypokalemia - K 3.4 - repeat am   Hospital day # 7 She is unfortunately had a recurrent pseudoaneurysm in the right groin and IR will reattempt thrombin injection and if unsuccessful may need open surgical removal by vascular surgery.  Long discussion patient and daughter and answered questions.  Discussed with Dr. Earleen Newport interventional radiology.  We will follow hematocrit greater than 50% time during this 35-minute visit was spent on counseling and coordination of care and discussion with care team.  Antony Contras, MD  To contact Stroke Continuity provider, please refer to [email or web address] After hours, contact General Neurology

## 2019-11-20 NOTE — Progress Notes (Signed)
Occupational Therapy Treatment Patient Details Name: [name] MRN: [id number] DOB: 1931/05/30 Today's Date: 11/20/2019    History of present illness 86 yo with acute right thalamic infarct s/p thrombectomy of Rt PCA 10/20 with revascularization, extubated 10/20. PMHx:Afib, SVT   OT comments  Pt making steady progress towards OT goals this session. Pt continues to present with impaired balance, decreased activity tolerance and generalized weakness impacting pts ability to complete BADLs. Pt able to progress OOB to chair this session with MIN- MOD A with RW. Pt with initial posterior LOB needing up to MOD A to correct. Pt reports mild dizziness upon initial stand, but reports dizziness improves as session progresses. Pt AxOx4 but then states, " I can't believe this my bedroom" at end of session. Reoriented pt to location and DC plan. Pt would continue to benefit from skilled occupational therapy while admitted and after d/c to address the below listed limitations in order to improve overall functional mobility and facilitate independence with BADL participation. DC plan remains appropriate, will follow acutely per POC.     Follow Up Recommendations  Home health OT;Supervision/Assistance - 24 hour;CIR    Equipment Recommendations  3 in 1 bedside commode    Recommendations for Other Services      Precautions / Restrictions Precautions Precautions: Fall Precaution Comments: watch BP, dizziness Restrictions Weight Bearing Restrictions: No       Mobility Bed Mobility Overal bed mobility: Needs Assistance Bed Mobility: Supine to Sit     Supine to sit: Min assist;HOB elevated     General bed mobility comments: MIN to elevate trunk into sitting and scoot hips to EOB using bed pad  Transfers Overall transfer level: Needs assistance Equipment used: Rolling walker (2 wheeled)   Sit to Stand: Min assist;Mod assist Stand pivot transfers: Min assist       General transfer  comment: pt able to power up from EOB with MIN A but required up to MOD A for posterior LOB. pt able to pivot to recliner to pts R side with MIIN A needing increased time for c/o dizziness    Balance Overall balance assessment: Needs assistance Sitting-balance support: Single extremity supported;Feet supported Sitting balance-Leahy Scale: Poor Sitting balance - Comments: required at least one UE support with static sitting Postural control: Left lateral lean Standing balance support: Bilateral upper extremity supported Standing balance-Leahy Scale: Poor Standing balance comment: reliant on BUE support and external assist                           ADL either performed or assessed with clinical judgement   ADL Overall ADL's : Needs assistance/impaired     Grooming: Wash/dry face;Oral care;Brushing hair;Sitting;Set up Grooming Details (indicate cue type and reason): from recliner, pt able to sequence all ADLs tasks with no cues needed                 Toilet Transfer: Minimal assistance;Moderate assistance;RW;Stand-pivot Toilet Transfer Details (indicate cue type and reason): simulated via functional mobility, MIN A for stand pivot transfer with RW, up to MOD A for balance d/t initial posterior lean in standing         Functional mobility during ADLs: Minimal assistance;Rolling walker;Moderate assistance (stand pivot only) General ADL Comments: pt continues to present with decreased activity tolerance, generalized weakness and impaired balance impacting pts ability to complete BADLs     Vision Baseline Vision/History: No visual deficits Patient Visual Report: No change from baseline;Other (  comment) (pt reports no visual changes and was able to locate all needed ADL items on table) noted appropriate depth perception with pt able to pour water from water bottle over tooth brush with no spillage.     Perception     Praxis      Cognition Arousal/Alertness:  Awake/alert Behavior During Therapy: WFL for tasks assessed/performed Overall Cognitive Status: Impaired/Different from baseline Area of Impairment: Awareness;Following commands;Safety/judgement;Problem solving                       Following Commands: Follows one step commands with increased time Safety/Judgement: Decreased awareness of deficits;Decreased awareness of safety Awareness: Intellectual Problem Solving: Slow processing;Difficulty sequencing General Comments: pt AxOx4 but at end of session states " I can't believe this my bedroom."        Exercises     Shoulder Instructions       General Comments BP prior to mobility from supine 154/69 HR 73 O2 95. Pt reports mild dizziness upon inital stand but reports improvement as mobility progressed.    Pertinent Vitals/ Pain       Pain Assessment: Faces Faces Pain Scale: Hurts a little bit Pain Location: R groin Pain Descriptors / Indicators: Sore Pain Intervention(s): Monitored during session;Repositioned  Home Living                                          Prior Functioning/Environment              Frequency  Min 2X/week        Progress Toward Goals  OT Goals(current goals can now be found in the care plan section)  Progress towards OT goals: Progressing toward goals  Acute Rehab OT Goals Patient Stated Goal: to improve strength and mobility OT Goal Formulation: With patient Time For Goal Achievement: 11/29/19 Potential to Achieve Goals: Good  Plan Discharge plan remains appropriate;Frequency remains appropriate    Co-evaluation                 AM-PAC OT "6 Clicks" Daily Activity     Outcome Measure   Help from another person eating meals?: None Help from another person taking care of personal grooming?: A Little Help from another person toileting, which includes using toliet, bedpan, or urinal?: A Little Help from another person bathing (including washing,  rinsing, drying)?: A Lot Help from another person to put on and taking off regular upper body clothing?: None Help from another person to put on and taking off regular lower body clothing?: A Lot 6 Click Score: 18    End of Session Equipment Utilized During Treatment: Gait belt;Rolling walker  OT Visit Diagnosis: Other abnormalities of gait and mobility (R26.89);Muscle weakness (generalized) (M62.81);Other symptoms and signs involving cognitive function;Other symptoms and signs involving the nervous system (R29.898)   Activity Tolerance Patient tolerated treatment well   Patient Left in chair;with call bell/phone within reach;with chair alarm set   Nurse Communication Mobility status        Time: 2353-6144 OT Time Calculation (min): 25 min  Charges: OT General Charges $OT Visit: 1 Visit OT Treatments $Self Care/Home Management : 23-37 mins  Lanier Clam., COTA/L Acute Rehabilitation Services [phone]    KERSTIE AGENT 11/20/2019, 8:25 AM

## 2019-11-20 NOTE — Progress Notes (Signed)
RT groin area slightly swollen and tender to touch but no other complaints. R & L Pedal Pulse +2.  Notified S. Biby who instructed to call IR for assessment. IR notified.

## 2019-11-21 ENCOUNTER — Inpatient Hospital Stay (HOSPITAL_COMMUNITY): Payer: Medicare Other

## 2019-11-21 DIAGNOSIS — I63531 Cerebral infarction due to unspecified occlusion or stenosis of right posterior cerebral artery: Secondary | ICD-10-CM | POA: Diagnosis not present

## 2019-11-21 LAB — CBC
HCT: 27.5 % — ABNORMAL LOW (ref 36.0–46.0)
Hemoglobin: 8.8 g/dL — ABNORMAL LOW (ref 12.0–15.0)
MCH: 31.5 pg (ref 26.0–34.0)
MCHC: 32 g/dL (ref 30.0–36.0)
MCV: 98.6 fL (ref 80.0–100.0)
Platelets: 346 10*3/uL (ref 150–400)
RBC: 2.79 MIL/uL — ABNORMAL LOW (ref 3.87–5.11)
RDW: 13 % (ref 11.5–15.5)
WBC: 8.2 10*3/uL (ref 4.0–10.5)
nRBC: 0.2 % (ref 0.0–0.2)

## 2019-11-21 LAB — BASIC METABOLIC PANEL
Anion gap: 13 (ref 5–15)
BUN: 7 mg/dL — ABNORMAL LOW (ref 8–23)
CO2: 20 mmol/L — ABNORMAL LOW (ref 22–32)
Calcium: 8.2 mg/dL — ABNORMAL LOW (ref 8.9–10.3)
Chloride: 104 mmol/L (ref 98–111)
Creatinine, Ser: 0.65 mg/dL (ref 0.44–1.00)
GFR, Estimated: >60 mL/min (ref >60)
Glucose, Bld: 107 mg/dL — ABNORMAL HIGH (ref 70–99)
Potassium: 3.6 mmol/L (ref 3.5–5.1)
Sodium: 137 mmol/L (ref 135–145)

## 2019-11-21 MED ORDER — LIDOCAINE HCL 1 % IJ SOLN
INTRAMUSCULAR | Status: AC
  Filled 2019-11-21: qty 20

## 2019-11-21 MED ORDER — LIDOCAINE HCL 1 % IJ SOLN
INTRAMUSCULAR | Status: DC | PRN
  Administered 2019-11-21: 20 mL via INTRADERMAL

## 2019-11-21 MED ORDER — THROMBIN FOR PERCUTANEOUS TREATMENT OF PSEUDOANEURYSM (5000UNITS/10ML)
Freq: Once | PERCUTANEOUS | Status: AC
  Administered 2019-11-21: 5000 [IU] via PERCUTANEOUS
  Filled 2019-11-21: qty 1

## 2019-11-21 NOTE — Procedures (Signed)
Interventional Radiology Procedure Note  Procedure: Ultrasound guided pseudoaneurysm thrombin injection  Findings: Please refer to procedural dictation for full description.  Similar appearing patent right CFA pseudoaneurysm with short, narrow neck.  Percutaneous injection of approximately 200 U thrombin with immediate thrombosis.  Ultrasound survey after 5 minutes with persistent thrombosis, patency of CFA and proximal SFA/PFA.  Palpable DP.  Complications: None immediate  Estimated Blood Loss: <5 mL  Recommendations: Remain flat for 2 hours. Recommend follow up duplex tomorrow morning, or this evening if operative plan pending.   Ruthann Cancer, MD Pager: [phone]

## 2019-11-21 NOTE — Progress Notes (Signed)
NIR.  History of acute CVA s/p cerebral arteriogram with emergent mechanical thrombectomy of right PCA occlusion achieving a TICI 3 revascularization via right femoral approach 11/14/2019 by Dr. Earleen Newport; found to have pseudoaneurysm of right femoral puncture site s/p thrombin injection 11/16/2019 by Dr. Earleen Newport; with continued pain/swelling of site with US revealing right femoral pseudoaneurysm reoccurrence s/p thrombin injection 11/21/2019 by Dr. Serafina Royals.  Recommended plans per NIR: 1. Repeat VAS [place] of right groin in AM (order placed). 2. Right leg straight x 2 hours. 3. Patient to remain on bedrest and continue to hold Eliquis until VAS [place] result of thrombosed pseudoaneurysm per Dr. Forde Dandy. Earleen Newport. 4. If VAS US reveals presence of pseudoaneurysm in AM, tentatively plan for surgical repair with vascular surgery 11/22/2019.  Discussed above with patient and son at bedside. All questions answered and concerns addressed.  Further plans per neurology/vascular surgery- appreciate and agree with management. NIR to follow.    Melissa Graff Aarik Blank, PA-C 11/21/2019, 11:51 AM

## 2019-11-21 NOTE — Progress Notes (Signed)
PT Cancellation Note  Patient Details Name: [name] MRN: [id number] DOB: 1931/11/03   Cancelled Treatment:    Reason Eval/Treat Not Completed: Medical issues which prohibited therapy;Active bedrest order - Pt on active bedrest until vascular US of R groin hematoma. Pt pending surgical repair tomorrow based on findings. Will check back.  [place] Pager [phone]  Office [phone]     [place] 11/21/2019, 2:16 PM

## 2019-11-21 NOTE — Progress Notes (Signed)
   VASCULAR SURGERY ASSESSMENT & PLAN:   RIGHT FEMORAL ARTERY PSEUDOANEURYSM: The patient is scheduled for thrombin injection today.  If this is not successful then please notify me so that I can schedule her for operative repair tomorrow. (cell [phone]) [I am in the office today]    SUBJECTIVE:   The pseudoaneurysm is not tender as long as no pushes on it.  PHYSICAL EXAM:   Vitals:   11/20/19 1537 11/20/19 2007 11/21/19 0003 11/21/19 0444  BP: (!) 158/69 (!) 153/72 (!) 173/78 (!) 167/73  Pulse: 79 93 83 93  Resp: _0 Temp: 99.6 F (37.6 C) 99.1 F (37.3 C) 98.3 F (36.8 C) 98 F (36.7 C)  TempSrc: Oral Oral Oral Oral  SpO2: 94% 96% 93% 92%  Weight:      Height:       No change in exam.  LABS:   Lab Results  Component Value Date   WBC 8.2 11/21/2019   HGB 8.8 (L) 11/21/2019   HCT 27.5 (L) 11/21/2019   MCV 98.6 11/21/2019   PLT 346 11/21/2019   Lab Results  Component Value Date   CREATININE 0.65 11/21/2019   Lab Results  Component Value Date   INR 1.0 11/13/2019    PROBLEM LIST:    Active Problems:   Stroke ([place])   Acute ischemic right PCA stroke ([facility])   Acute respiratory failure ([facility])   CURRENT MEDS:   .  stroke: mapping our early stages of recovery book   Does not apply Once  . chlorhexidine gluconate (MEDLINE KIT)  15 mL Mouth Rinse BID  . docusate sodium  100 mg Oral BID  . pantoprazole  40 mg Oral Daily  . polyethylene glycol  17 g Oral Daily    Deitra Mayo Office: [phone] 11/21/2019

## 2019-11-21 NOTE — Progress Notes (Signed)
OT Cancellation Note  Patient Details Name: [name] MRN: [id number] DOB: 08-26-1931   Cancelled Treatment:    Reason Eval/Treat Not Completed: Medical issues which prohibited therapy;Other (comment) Per Radiology note pt to "Remain flat for 2 hours" post procedure. Will check back as pt medically appropriate for OT intervention.  Lanier Clam., COTA/L Acute Rehabilitation Services [phone] [place] 11/21/2019, 10:14 AM

## 2019-11-21 NOTE — Progress Notes (Signed)
STROKE TEAM PROGRESS NOTE   INTERVAL HISTORY Her son is at the bedside.  She continues to have tense hematoma in the right groin with tenderness to touch.  Interventional radiology has reinjected thrombin into the pseudoaneurysm and plan to do follow-up ultrasound tomorrow.  If pseudoaneurysm is not resolved vascular surgery may do open surgical procedure.  Patient neurological exam is unchanged.  Vital signs are stable.  OBJECTIVE Vitals:   11/21/19 0003 11/21/19 0444 11/21/19 1013 11/21/19 1124  BP: (!) 173/78 (!) 167/73 128/74 (!) 141/76  Pulse: 83 93 90 86  Resp: 17 18 18 18   Temp: 98.3 F (36.8 C) 98 F (36.7 C) 98.3 F (36.8 C) 98.6 F (37 C)  TempSrc: Oral Oral Oral Oral  SpO2: 93% 92% 92% 94%  Weight:      Height:       CBC:  Recent Labs  Lab 11/19/19 0401 11/21/19 0411  WBC 8.1 8.2  HGB 8.5* 8.8*  HCT 26.6* 27.5*  MCV 98.5 98.6  PLT 314 426   Basic Metabolic Panel:  Recent Labs  Lab 11/15/19 0408 11/16/19 1206 11/19/19 0401 11/21/19 0411  NA 140   < > 137 137  K 3.9   < > 3.4* 3.6  CL 110   < > 103 104  CO2 22   < > 25 20*  GLUCOSE 92   < > 108* 107*  BUN 7*   < > 8 7*  CREATININE 0.71   < > 0.79 0.65  CALCIUM 7.9*   < > 7.9* 8.2*  MG 1.9  --   --   --    < > = values in this interval not displayed.    IMAGING CT ANGIO PELVIS W OR WO CONTRAST  Result Date: 11/20/2019 CLINICAL DATA:  86 year old female with a history of mechanical thrombectomy for posterior circulation emergent large vessel occlusion 11/14/2019, with delayed hematoma and pseudoaneurysm of the access site. This was treated with thrombin injection with resolution 11/16/2019, now with apparent recurrent symptoms. EXAM: CTA PELVIS WITH AND WITHOUT CONTRAST TECHNIQUE: Spiral CT of the pelvis was performed before administration of IV contrast. Multidetector CTA imaging of the pelvis was then performed following the standard protocol following the bolus administration of intravenous contrast.  Reformatted images in coronal and sagittal planes performed on a separate workstation. CONTRAST:  132mL OMNIPAQUE IOHEXOL 350 MG/ML SOLN COMPARISON:  Ultrasound 11/16/2019 FINDINGS: VASCULAR: Mild atherosclerotic changes of the lower infrarenal abdominal aorta. There is incidental imaging of an accessory left renal artery to the lower pole segment from the lower abdominal aorta. Right lower extremity: Moderate atherosclerotic changes with irregular plaque of the right common iliac artery. Greatest diameter measures 15 mm. Hypogastric artery is patent. External iliac artery patent with minimal tortuosity. No high-grade stenosis or occlusion. No significant atherosclerotic changes of the common femoral artery. There is a rounded outpouching from the distal common femoral artery measuring 19 mm by 18 mm. Intermediate density on the pre contrast CT image, with relatively uniform filling after the contrast bolus, just less than the density of the adjacent blood pool. A narrow neck to the common femoral artery is presumed with no obvious wide neck. Proximal profunda femoris and SFA patent. Left lower extremity: Mild irregular atherosclerotic plaque of the left common iliac artery, including a chronic appearing dissection flap, non flow limiting in the common iliac artery terminating above the bifurcation. Greatest diameter measures 13 mm. No inflammatory changes. Mild tortuosity. Hypogastric artery is patent. Mild tortuosity of  the external iliac artery with minimal atherosclerosis. Common femoral artery patent with minimal atherosclerosis. Proximal profunda femoris and SFA patent. Venous: Unremarkable appearance of the venous system. NONVASCULAR: Unremarkable appearance of the visualized GI system. Urinary bladder partially distended. Changes of hysterectomy. Degenerative changes of the lower lumbar spine. Unremarkable bilateral hips. Hematoma of the proximal left thigh estimated 7.3 cm x 3.9 cm on axial images, 9.1 cm  on coronal images. IMPRESSION: CT angiogram confirms patent right common femoral artery pseudoaneurysm with narrow neck, estimated 19 mm x 18 mm, and associated right thigh hematoma. Aortic atherosclerosis and atherosclerotic changes of the bilateral iliac arteries. Signed, [name]. Dellia Nims, RPVI [facility] Radiology Electronically Signed   By: [name] D.O.   On: 11/20/2019 14:28   US Guided Needle Placement  Result Date: 11/21/2019 CLINICAL DATA:  86 year old female with history of stroke status post catheter directed intervention with postprocedural right common femoral artery pseudoaneurysm. Status post percutaneous thrombin injection on 11/16/2019 with recurrent pseudoaneurysm patency noted on recent CT a from 11/20/2019. EXAM: Ultrasound guided puncture of pseudoaneurysm with thrombin injection MEDICATIONS: 200 units thrombin PROCEDURE: Informed written consent was obtained from the patient following discussion of risks, benefits, and alternatives to this procedure. Sterile preparation of the right groin was performed and sterile drapes were placed to expose the prior access site. Ultrasound demonstrates a 0.2 cm narrow neck pseudoaneurysm arising from the right common femoral artery. Color Doppler demonstrated to and fro, swirling flow within the pseudoaneurysm. Permanent ultrasound images were recorded. After appropriate subdermal analgesia with 1% lidocaine, ultrasound-guided puncture was made into the pseudoaneurysm using a 25 gauge needle with visualization of needle entry into the pseudoaneurysm. Under real-time ultrasound guidance, 200 units of thrombin were injected, for a total volume of 0.2 mL. Flow to the pseudoaneurysm was occluded. The native vessel remain patent. The needle was removed and a sterile dry dressing was placed. Peripheral pulses were unchanged. The patient tolerated the procedure well and was returned to the floor.  IMPRESSION: Successful ultrasound-guided thrombin injection of a right common femoral artery pseudoaneurysm. PLAN: 1. 2 hours flat, 6 hours bed rest total. 2. Follow-up pseudoaneurysm duplex in approximately 24 hours. Melissa Cancer, Lynch [facility] Radiology Electronically Signed   By: [name]   On: 11/21/2019 09:49     PHYSICAL EXAM        Temp:  [98 F (36.7 C)-99.6 F (37.6 C)] 98.6 F (37 C) (10/27 1124) Pulse Rate:  [79-93] 86 (10/27 1124) Resp:  [17-18] 18 (10/27 1124) BP: (128-173)/(69-78) 141/76 (10/27 1124) SpO2:  [92 %-96 %] 94 % (10/27 1124)  General - Well nourished, well developed, no acute distress.  Ophthalmologic - fundi not visualized due to noncooperation.  Cardiovascular - irregularly irregular heart rate and rhythm. Right groin shows tense hematoma which is tender to touch  Mental Status -  Level of arousal and orientation to place, age, month and person were intact, however, not orientated to year. Language exam showed no aphasia, able to name and repeat, follow simple commands.    Cranial Nerves II - XII - II - Visual field intact OU. III, IV, VI - Extraocular movements intact. V - Facial sensation intact bilaterally. VII - subtle left nasolabial fold flattening. VIII - Hearing & vestibular intact bilaterally. X - Palate elevates symmetrically. XI - Chin turning & shoulder shrug intact bilaterally. XII - Tongue protrusion intact.  Motor Strength - The patient's strength was  normal in right upper and lower extremities, left upper extremity mildly decreased finger grip, left LE 4/5 proximal and 4+/5 distally.  Bulk was normal and fasciculations were absent. Right lower extremity movements slightly limited due to hip pain Motor Tone - Muscle tone was assessed at the neck and appendages and was normal.  Reflexes - The patient's reflexes were symmetrical in all extremities and she had no pathological  reflexes.  Sensory - Light touch, temperature/pinprick were assessed and were symmetrical.    Coordination - The patient had normal movements in the hands with no ataxia or dysmetria.  Tremor was absent.  Gait and Station - able to stand with assistance off of the commode, hold on to walker, and pivot to sit in chair.   ASSESSMENT/PLAN [name] is a 86 y.o. female with history of Afib/flutter s/p ablation, off AC due to persistent sinus rhythm, and a recent diagnosis of pneumonia (she was not improving on the first round of antibiotics tried and therefore was broadened to Augmentin on Monday), presenting with acute onset right gaze preference and left-sided weakness. The patient received IV t-PA Tuesday 11/19 at 10:59 PM and sent to IR for R PCA occlusion.  Stroke: R thalamic, cerebellar, and frontal small infarcts with right PCA occlusion s/p tPA and IR with TICI3 from AF not on AC Small SAH interpeduncular cistern post IR  CT Head - Age indeterminate small vessel disease including in the right thalamus, but no acute cortically based infarct or acute intracranial hemorrhage identified.   CTA H&N - Positive for occlusion of the Right PCA origin. No other large vessel occlusion, and generally mild for age atherosclerosis in the head and neck.   Cerebral angio/ IR - Rt PCA occlusion with TICI 3 flow   CT head - Status post Right PCA endovascular revascularization with trace subarachnoid blood/contrast in the interpeduncular cistern. Small 10 mm area of contrast staining versus petechial hemorrhage in the medial right thalamus.   MRI head - R thalamus infarct with associated mild hemorrhage as noted on recent CT. There is also mild subarachnoid hemorrhage in the interpeduncular cistern unchanged from CT. Additional small areas of acute infarct in the right cerebellum and right frontal cortex. Moderate atrophy   MRA head -  Right PCA patent post thrombectomy. There is a mild stenosis in  the right PCA at the P1/ P2 junction.   2D Echo - EF 60-65%. No source of embolus. LA moderately dilated  [employer] Virus 2 - negative  LDL - 26  HgbA1c - 5.7  VTE prophylaxis - Heparin 5000 units sq tid   aspirin 81 mg daily prior to admission, now on Eliquis-> will put Eliquis on hold d/t return of pseudoaneurysm   Therapy recommendations:  HH OT, HH PT ->  CIR   Disposition:  CIR  R Groin Hematoma  Developed hospital D#2 at angio puncture site  Groin pseudoaneurysm [place] 2.0 x 2.4 R CFA pseudoaneurysm w/ hematoma  Thrombin injection 10/22 [name])  Repeat US 10/23 - no evidence of pseudoaneurysm   R groin tender this am after putting on  Eliquis 10/24  CTA pelvis 10/26 confirms R common femoral artery pseudoaneurysm with narrow neck, estimated 19 mm x 18 mm, and associated right thigh hematoma.  [facility] [place]) following - for  surgical repair if repeat thrombin injection not successful  Bedrest   Had repeat thrombin injection 10/27    Acute Respiratory Failure, resolved  Intubated for IR, left intubated post IR  Sedated w/ fentanyl and propofol, now off  Extubated 10/20  Patient stable  CCM signed off   Atrial Fibrillation/Aflutter  Home anticoagulation:  none   post ablation Dr. Ashley Mariner 2004. Doing very well. No further recurrences.  Initially no AC given post tPA small SAH   Now off AC d/t recurrent R groin pseudoaneurysm  Follows with Dr. Marlou Porch at [facility]  No response to intial abx  Broadened as OP to Augmention 10/18  Still has intermittent coughing  CTA - layering left pleural effusion, patchy left upper lobe opacity compatible with pneumonia  CXR 10/20 - small bilateral pleural effusions. Probable chronic interstitial changes.  WBCs 8.1 (afebrile)  Hypertension  Home BP meds: metoprolol   Treated short term w/ cleviprex, off   BP stable . BP goal < 160  . Long-term BP goal  normotensive  Hyperlipidemia  Home Lipid lowering medication: Lipitor 10 mg daily  LDL 26, goal < 70  Will not resume lipitor due to low LDL level  Dysphagia, resolved . Secondary to stroke . Cleared for regular consistency, thin liquid diet . Off IVF  . Sore throat after extubation   Other Stroke Risk Factors  Advanced age  Former cigarette smoker - quit  ETOH use, advised to drink no more than 1 alcoholic beverage per day.  Other Active Problems  Emphysema (ZOX09-U04.9)   Aortic Atherosclerosis (ICD10-I70.0)   Anemia - Hgb - 12.8->13.3-> 10.5->9.8->8.8->9.1->8.4->8.5->8.8  Hypokalemia - K 3.6 - resolved  Hospital day # 8 Continue ongoing management for right groin pseudoaneurysm and hematoma as per interventional radiology and vascular surgery.  Continue bedrest for now and hold off on therapies and Eliquis till pseudoaneurysm is resolved.  Discussed with patient and son and answered questions.  Discussed with interventional radiology.  Greater than 50% time during this 25-minute visit was spent on counseling and coordination of care about right groin hematoma and pseudoaneurysm and recent stroke and answering questions Melissa Contras, Lynch  To contact Stroke Continuity provider, please refer to [email or web address] After hours, contact General Neurology

## 2019-11-22 ENCOUNTER — Inpatient Hospital Stay (HOSPITAL_COMMUNITY): Payer: Medicare Other

## 2019-11-22 DIAGNOSIS — I63531 Cerebral infarction due to unspecified occlusion or stenosis of right posterior cerebral artery: Secondary | ICD-10-CM | POA: Diagnosis not present

## 2019-11-22 DIAGNOSIS — I724 Aneurysm of artery of lower extremity: Secondary | ICD-10-CM | POA: Diagnosis not present

## 2019-11-22 MED ORDER — APIXABAN 5 MG PO TABS
5.0000 mg | ORAL_TABLET | Freq: Two times a day (BID) | ORAL | Status: DC
  Administered 2019-11-22 – 2019-11-23 (×2): 5 mg via ORAL
  Filled 2019-11-22 (×2): qty 1

## 2019-11-22 NOTE — Progress Notes (Signed)
   VASCULAR SURGERY ASSESSMENT & PLAN:   RIGHT FEMORAL ARTERY PSEUDOANEURYSM: The patient underwent successful thrombin injection of the pseudoaneurysm yesterday by interventional radiology. She is for a follow-up duplex scan today.   SUBJECTIVE:   No specific complaints this morning.  PHYSICAL EXAM:   Vitals:   11/21/19 2341 11/22/19 0355 11/22/19 0619 11/22/19 0717  BP: (!) 158/78 (!) 178/66 118/65 135/63  Pulse: 82 75 62 75  Resp: _0 Temp: 98 F (36.7 C) 98.4 F (36.9 C)  98 F (36.7 C)  TempSrc:    Oral  SpO2: 98% 94%  96%  Weight:      Height:       The right groin is tender to palpation but otherwise is nontender.  LABS:   Lab Results  Component Value Date   WBC 8.2 11/21/2019   HGB 8.8 (L) 11/21/2019   HCT 27.5 (L) 11/21/2019   MCV 98.6 11/21/2019   PLT 346 11/21/2019   Lab Results  Component Value Date   CREATININE 0.65 11/21/2019   Lab Results  Component Value Date   INR 1.0 11/13/2019    PROBLEM LIST:    Active Problems:   Stroke ([place])   Acute ischemic right PCA stroke (H[facility])   Acute respiratory failure (H[facility])   CURRENT MEDS:   .  stroke: mapping our early stages of recovery book   Does not apply Once  . chlorhexidine gluconate (MEDLINE KIT)  15 mL Mouth Rinse BID  . docusate sodium  100 mg Oral BID  . pantoprazole  40 mg Oral Daily  . polyethylene glycol  17 g Oral Daily    Deitra Mayo Office: [phone] 11/22/2019

## 2019-11-22 NOTE — Progress Notes (Signed)
[place] for apixaban Indication: atrial fibrillation  Labs: Recent Labs    11/21/19 0411  HGB 8.8*  HCT 27.5*  PLT 346  CREATININE 0.65    Assessment: 13 yof with hx afib/aflutter s/p ablation, off anticoagulation PTA presenting with acute ischemic stroke, s/p TPA 10/19 PM. Noted, no anticoagulation initially started with small SAH s/p TPA and recurrent R groin pseudoaneurysm s/p thrombin injection with obliteration in IR 10/27. R groin hematoma stable per notes. Pharmacy consulted by Neurology to resume apixaban 10/28. SCr wnl, Hg low stable 8.8, plt wnl. No current active bleed issues reported.  Goal of Therapy:  Stroke prevention Monitor platelets by anticoagulation protocol: Yes   Plan:  Apixaban 5mg  PO BID Monitor CBC, s/sx bleeding   Elicia Lamp, PharmD, BCPS Clinical Pharmacist 11/22/2019 3:56 PM

## 2019-11-22 NOTE — Progress Notes (Signed)
Referring Physician(s): Code stroke- Bhagat, Srishti L (neurology)  Supervising Physician: Markus Daft  Patient Status:  [facility] - In-pt  Chief Complaint:  F/U pseudoaneurysm thrombin injection  Brief History:  History of acute CVA s/p cerebral arteriogram with emergent mechanical thrombectomy of right PCA occlusion achieving a TICI 3 revascularization via right femoral approach 11/14/2019 by Dr. Earleen Newport.  She was found to have pseudoaneurysm of right femoral puncture site s/p thrombin injection 11/16/2019 by Dr. Earleen Newport.  Patient with pain/swelling of right femoral puncture site. CTA pelvis revealed right femoral pseudoaneurysm reoccurrence.   She underwent image-guided right femoral thrombin injection yesterday by Dr. Serafina Royals.  US done this am showed PSA remains closed.  Subjective:  Ms Rick is feeling better today. She denies increased pain in the Right groin. Hematoma is stable. She wants to be able to get OOB. Daughter at bedside.  Allergies: Bee venom, Mixed vespid venom, Codeine, Sulfa antibiotics, and Erythromycin  Medications: Prior to Admission medications   Medication Sig Start Date End Date Taking? Authorizing Provider  amoxicillin-clavulanate (AUGMENTIN) 875-125 MG tablet Take 1 tablet by mouth 2 (two) times daily. 11/12/19  Yes [provider]  aspirin 81 MG tablet Take 1 tablet (81 mg total) by mouth daily. 01/08/13  Yes Carlena Bjornstad, MD  atorvastatin (LIPITOR) 10 MG tablet Take 10 mg by mouth daily.   Yes [provider]  BIOTIN PO Take 1 tablet by mouth daily.    Yes [provider]  CALCIUM PO Take 1 tablet by mouth daily.    Yes [provider]  Cholecalciferol (VITAMIN D3) 50 MCG (2000 UT) CAPS Take 2,000 Units by mouth daily.   Yes [provider]  Coenzyme Q10 (CO Q-10 PO) Take 1 tablet by mouth daily.    Yes [provider]  metoprolol succinate (TOPROL-XL) 50 MG 24 hr tablet Take 50 mg by mouth  daily. Take with or immediately following a meal.   Yes [provider]  EPINEPHrine 0.3 mg/0.3 mL IJ SOAJ injection Inject 0.3 mg into the muscle as needed for anaphylaxis.  08/04/18   [provider]  Investigational - Study Medication Additional Study Details    [provider]  trimethoprim (TRIMPEX) 100 MG tablet Take 100 mg by mouth as directed. Patient not taking: Reported on 11/14/2019    [provider]     Vital Signs: BP 135/63 (BP Location: Right Arm)   Pulse 75   Temp 98 F (36.7 C) (Oral)   Resp 18   Ht 5\' 1"  (1.549 m)   Wt 66.4 kg   LMP  (LMP Unknown)   SpO2 96%   BMI 27.66 kg/m   Physical Exam Vitals reviewed.  Constitutional:      Appearance: Normal appearance.  Cardiovascular:     Rate and Rhythm: Normal rate.  Pulmonary:     Effort: Pulmonary effort is normal. No respiratory distress.  Abdominal:     Palpations: Abdomen is soft.  Musculoskeletal:     Comments: Right groin with stable hematoma. Ecchymosis present medial thigh, unchanged  Neurological:     General: No focal deficit present.     Mental Status: She is alert and oriented to person, place, and time.  Psychiatric:        Mood and Affect: Mood normal.        Behavior: Behavior normal.        Thought Content: Thought content normal.        Judgment: Judgment normal.  Imaging: CT ANGIO PELVIS W OR WO CONTRAST  Result Date: 11/20/2019 CLINICAL DATA:  86 year old female with a history of mechanical thrombectomy for posterior circulation emergent large vessel occlusion 11/14/2019, with delayed hematoma and pseudoaneurysm of the access site. This was treated with thrombin injection with resolution 11/16/2019, now with apparent recurrent symptoms. EXAM: CTA PELVIS WITH AND WITHOUT CONTRAST TECHNIQUE: Spiral CT of the pelvis was performed before administration of IV contrast. Multidetector CTA imaging of the pelvis was then performed following the standard  protocol following the bolus administration of intravenous contrast. Reformatted images in coronal and sagittal planes performed on a separate workstation. CONTRAST:  159mL OMNIPAQUE IOHEXOL 350 MG/ML SOLN COMPARISON:  Ultrasound 11/16/2019 FINDINGS: VASCULAR: Mild atherosclerotic changes of the lower infrarenal abdominal aorta. There is incidental imaging of an accessory left renal artery to the lower pole segment from the lower abdominal aorta. Right lower extremity: Moderate atherosclerotic changes with irregular plaque of the right common iliac artery. Greatest diameter measures 15 mm. Hypogastric artery is patent. External iliac artery patent with minimal tortuosity. No high-grade stenosis or occlusion. No significant atherosclerotic changes of the common femoral artery. There is a rounded outpouching from the distal common femoral artery measuring 19 mm by 18 mm. Intermediate density on the pre contrast CT image, with relatively uniform filling after the contrast bolus, just less than the density of the adjacent blood pool. A narrow neck to the common femoral artery is presumed with no obvious wide neck. Proximal profunda femoris and SFA patent. Left lower extremity: Mild irregular atherosclerotic plaque of the left common iliac artery, including a chronic appearing dissection flap, non flow limiting in the common iliac artery terminating above the bifurcation. Greatest diameter measures 13 mm. No inflammatory changes. Mild tortuosity. Hypogastric artery is patent. Mild tortuosity of the external iliac artery with minimal atherosclerosis. Common femoral artery patent with minimal atherosclerosis. Proximal profunda femoris and SFA patent. Venous: Unremarkable appearance of the venous system. NONVASCULAR: Unremarkable appearance of the visualized GI system. Urinary bladder partially distended. Changes of hysterectomy. Degenerative changes of the lower lumbar spine. Unremarkable bilateral hips. Hematoma of the  proximal left thigh estimated 7.3 cm x 3.9 cm on axial images, 9.1 cm on coronal images. IMPRESSION: CT angiogram confirms patent right common femoral artery pseudoaneurysm with narrow neck, estimated 19 mm x 18 mm, and associated right thigh hematoma. Aortic atherosclerosis and atherosclerotic changes of the bilateral iliac arteries. Signed, Dulcy Fanny. Dellia Nims, RPVI [facility] Radiology Electronically Signed   By: Corrie Mckusick D.O.   On: 11/20/2019 14:28   US Guided Needle Placement  Result Date: 11/21/2019 CLINICAL DATA:  86 year old female with history of stroke status post catheter directed intervention with postprocedural right common femoral artery pseudoaneurysm. Status post percutaneous thrombin injection on 11/16/2019 with recurrent pseudoaneurysm patency noted on recent CT a from 11/20/2019. EXAM: Ultrasound guided puncture of pseudoaneurysm with thrombin injection MEDICATIONS: 200 units thrombin PROCEDURE: Informed written consent was obtained from the patient following discussion of risks, benefits, and alternatives to this procedure. Sterile preparation of the right groin was performed and sterile drapes were placed to expose the prior access site. Ultrasound demonstrates a 0.2 cm narrow neck pseudoaneurysm arising from the right common femoral artery. Color Doppler demonstrated to and fro, swirling flow within the pseudoaneurysm. Permanent ultrasound images were recorded. After appropriate subdermal analgesia with 1% lidocaine, ultrasound-guided puncture was made into the pseudoaneurysm using a 25 gauge needle with visualization of needle entry into the pseudoaneurysm. Under  real-time ultrasound guidance, 200 units of thrombin were injected, for a total volume of 0.2 mL. Flow to the pseudoaneurysm was occluded. The native vessel remain patent. The needle was removed and a sterile dry dressing was placed. Peripheral pulses were unchanged. The  patient tolerated the procedure well and was returned to the floor. IMPRESSION: Successful ultrasound-guided thrombin injection of a right common femoral artery pseudoaneurysm. PLAN: 1. 2 hours flat, 6 hours bed rest total. 2. Follow-up pseudoaneurysm duplex in approximately 24 hours. Ruthann Cancer, MD [facility] Radiology Electronically Signed   By: Ruthann Cancer MD   On: 11/21/2019 09:49   VAS [place] GROIN PSEUDOANEURYSM  Result Date: 11/22/2019  ARTERIAL PSEUDOANEURYSM  Exam: Right groin History: S/p thrombin injection 11/21/2019. Comparison Study: Prior study done 11/16/19, pseudoaneurysm found. Performing Technologist: Sharion Dove RVS  Examination Guidelines: A complete evaluation includes B-mode imaging, spectral Doppler, color Doppler, and power Doppler as needed of all accessible portions of each vessel. Bilateral testing is considered an integral part of a complete examination. Limited examinations for reoccurring indications may be performed as noted.  Findings: A mixed echogenic structure measuring approximately 3.0 cm x 7.6 cm is visualized at the proximal thigh with ultrasound characteristics of a hematoma.  Summary: Pseudoaneurysm remains closed.    --------------------------------------------------------------------------------    Preliminary     Labs:  CBC: Recent Labs    11/17/19 0220 11/18/19 0248 11/19/19 0401 11/21/19 0411  WBC 9.2 10.0 8.1 8.2  HGB 9.1* 8.4* 8.5* 8.8*  HCT 28.3* 25.9* 26.6* 27.5*  PLT 283 287 314 346    COAGS: Recent Labs    11/13/19 2244  INR 1.0  APTT 31    BMP: Recent Labs    11/17/19 0220 11/18/19 0248 11/19/19 0401 11/21/19 0411  NA 139 138 137 137  K 4.0 3.8 3.4* 3.6  CL 105 105 103 104  CO2 27 24 25  20*  GLUCOSE 117* 108* 108* 107*  BUN 5* 7* 8 7*  CALCIUM 8.1* 8.0* 7.9* 8.2*  CREATININE 0.66 0.71 0.79 0.65  GFRNONAA >60 >60 >60 >60    LIVER FUNCTION TESTS: Recent Labs     11/13/19 2244 11/15/19 0408  BILITOT 0.8 0.9  AST 63* 24  ALT 61* 32  ALKPHOS 46 37*  PROT 6.2* 5.1*  ALBUMIN 2.8* 2.4*    Assessment and Plan:  CVA - s/p intervention by Dr. Earleen Newport on 10/20. Development of PSA with thrombin injection on 10/22.  Recurrence of PSA - s/p additional thrombin injection on 10/27.  US showed PSA remains closed.  Ok to resume physical/occupational therapy - activity as tolerated.  Ok to resume anticoagulation/antiplatelet therapy as previously ordered.  Expect ecchymosis to move distally as patient is more ambulatory.  Electronically Signed: Murrell Redden, PA-C 11/22/2019, 11:20 AM    I spent a total of 15 Minutes at the the patient's bedside AND on the patient's hospital floor or unit, greater than 50% of which was counseling/coordinating care for f/u thrombin injection of pseudoaneurysm.

## 2019-11-22 NOTE — Progress Notes (Signed)
VASCULAR LAB    Ultrasound of the right groin for follow up post Thrombin injection, has been performed.  See CV proc for preliminary results.   Donda Friedli, RVT 11/22/2019, 8:48 AM

## 2019-11-22 NOTE — Progress Notes (Signed)
Occupational Therapy Treatment Patient Details Name: SOLSTICE LASTINGER MRN: [id number] DOB: November 29, 1931 Today's Date: 11/22/2019    History of present illness 86 yo with acute right thalamic infarct s/p thrombectomy of Rt PCA 10/20 with revascularization, extubated 10/20. PMHx:Afib, SVT   OT comments  Patient continues to make steady progress towards goals in skilled OT session. Patient's session encompassed co-treat with PT in order to reassess functional mobility, activity tolerance, and ADLs post procedure. Pt remains minimally limited by pain in R groin but is participatory. Noted to demonstrate increased pauses when prompted in session, but was able to answer appropriately, however required increased cues due to STM deficits. When sitting EOB, posterior lean and diminished activity tolerance noted, therefore completed weight shifting on elbows bilaterally x3, requring min A when leaning on L elbow, and gaurding when leaning on R elbow (2 rest breaks in between due to fatigue). Noted ability to find midline improved after weight shifiting on elbows but will revert back to L lateral lean when fatigued. Increased cues required to complete stand pivot transfer to chair, but able to complete with increased time. Discharge to CIR remains appropriate due to multi-system involvement, increased weakness and activity tolerance, and excellent family support. Therapy will continue to follow acutely.    Follow Up Recommendations  Home health OT;Supervision/Assistance - 24 hour;CIR    Equipment Recommendations  3 in 1 bedside commode    Recommendations for Other Services      Precautions / Restrictions Precautions Precautions: Fall Precaution Comments: watch BP, dizziness Restrictions Weight Bearing Restrictions: No       Mobility Bed Mobility Overal bed mobility: Needs Assistance Bed Mobility: Rolling;Sidelying to Sit Rolling: Mod assist Sidelying to sit: Mod assist       General bed  mobility comments: Mod A to elevate trunk into sitting, increased ability to scoot EOB  Transfers Overall transfer level: Needs assistance Equipment used: Rolling walker (2 wheeled) Transfers: Sit to/from [facility] Sit to Stand: Mod assist Stand pivot transfers: Mod assist       General transfer comment: Mod A due to posterior lean, pt more hesistant with transfers due to pain, requiring increased cues and reassurance to sequence    Balance Overall balance assessment: Needs assistance Sitting-balance support: Bilateral upper extremity supported;Feet supported Sitting balance-Leahy Scale: Poor Sitting balance - Comments: posterior lean and diminished activity tolerance noted, completed weight shifting on elbows x3, requring min A when leaning on L elbow, and gaurding when leaning on R elbow (2 rest breaks in between due to fatigue) ability to find midline improved after weight shifiting on elbows but will revert back to L lateral lean when fatigued Postural control: Posterior lean;Left lateral lean Standing balance support: Bilateral upper extremity supported Standing balance-Leahy Scale: Poor Standing balance comment: reliant on BUE support and external assist                           ADL either performed or assessed with clinical judgement   ADL Overall ADL's : Needs assistance/impaired     Grooming: Brushing hair;Sitting;Set up Grooming Details (indicate cue type and reason): from recliner, cues for thoroughness                             Functional mobility during ADLs: Moderate assistance;Cueing for sequencing;Cueing for safety;Rolling walker General ADL Comments: pt with decreased activity tolerance, increased pain, and further weakness due to extended  bed rest from procedure     Vision       Perception     Praxis      Cognition Arousal/Alertness: Awake/alert Behavior During Therapy: WFL for tasks  assessed/performed Overall Cognitive Status: Impaired/Different from baseline Area of Impairment: Awareness;Following commands;Safety/judgement;Problem solving;Memory                   Current Attention Level: Sustained Memory: Decreased short-term memory Following Commands: Follows one step commands with increased time Safety/Judgement: Decreased awareness of deficits;Decreased awareness of safety Awareness: Intellectual Problem Solving: Slow processing;Difficulty sequencing;Decreased initiation;Requires verbal cues General Comments: Requires step by step cues to complete tasks, occasionally requiring multi-modal cues for initiation, states she wants to get stronger one moment and transfer to chair, then loses place and will prompt "what are we doing"        Exercises     Shoulder Instructions       General Comments      Pertinent Vitals/ Pain       Pain Assessment: Faces Faces Pain Scale: Hurts even more Pain Location: R groin Pain Descriptors / Indicators: Sore;Discomfort Pain Intervention(s): Limited activity within patient's tolerance;Monitored during session;Repositioned  Home Living                                          Prior Functioning/Environment              Frequency  Min 2X/week        Progress Toward Goals  OT Goals(current goals can now be found in the care plan section)  Progress towards OT goals: Progressing toward goals  Acute Rehab OT Goals Patient Stated Goal: to improve strength and mobility OT Goal Formulation: With patient Time For Goal Achievement: 11/29/19 Potential to Achieve Goals: Good  Plan Discharge plan remains appropriate;Frequency remains appropriate    Co-evaluation                 AM-PAC OT "6 Clicks" Daily Activity     Outcome Measure   Help from another person eating meals?: None Help from another person taking care of personal grooming?: A Little Help from another person  toileting, which includes using toliet, bedpan, or urinal?: A Little Help from another person bathing (including washing, rinsing, drying)?: A Lot Help from another person to put on and taking off regular upper body clothing?: None Help from another person to put on and taking off regular lower body clothing?: A Lot 6 Click Score: 18    End of Session Equipment Utilized During Treatment: Rolling walker  OT Visit Diagnosis: Other abnormalities of gait and mobility (R26.89);Muscle weakness (generalized) (M62.81);Other symptoms and signs involving cognitive function;Other symptoms and signs involving the nervous system (R29.898)   Activity Tolerance Patient tolerated treatment well   Patient Left in chair;with call bell/phone within reach;with family/visitor present   Nurse Communication Mobility status        Time: 4098-1191 OT Time Calculation (min): 24 min  Charges: OT General Charges $OT Visit: 1 Visit OT Treatments $Self Care/Home Management : 8-22 mins  Corinne Ports E. Silvestre [place], COTA/L Acute Rehabilitation Services [place] 11/22/2019, 3:05 PM

## 2019-11-22 NOTE — Progress Notes (Signed)
STROKE TEAM PROGRESS NOTE   INTERVAL HISTORY Her daughter is at bedside. Dr. Leonie Man discussed resuming eliquis with pt and daughter.  Patient had successful thrombin injection into the right groin pseudoaneurysm with obliteration by Dr. Serafina Royals interventional radiology.Marland Kitchen  Ultrasound today to confirm it.  Patient has been cleared for ambulation.  Neurological exam unchanged.  Vital signs stable.  Right groin hematoma stable and tender  OBJECTIVE Vitals:   11/22/19 0355 11/22/19 0619 11/22/19 0717 11/22/19 1124  BP: (!) 178/66 118/65 135/63 (!) 114/58  Pulse: 75 62 75 82  Resp: 16  18 18   Temp: 98.4 F (36.9 C)  98 F (36.7 C) 98.2 F (36.8 C)  TempSrc:   Oral Oral  SpO2: 94%  96% 95%  Weight:      Height:       CBC:  Recent Labs  Lab 11/19/19 0401 11/21/19 0411  WBC 8.1 8.2  HGB 8.5* 8.8*  HCT 26.6* 27.5*  MCV 98.5 98.6  PLT 314 875   Basic Metabolic Panel:  Recent Labs  Lab 11/19/19 0401 11/21/19 0411  NA 137 137  K 3.4* 3.6  CL 103 104  CO2 25 20*  GLUCOSE 108* 107*  BUN 8 7*  CREATININE 0.79 0.65  CALCIUM 7.9* 8.2*    IMAGING VAS [place] GROIN PSEUDOANEURYSM  Result Date: 11/22/2019  ARTERIAL PSEUDOANEURYSM  Exam: Right groin History: S/p thrombin injection 11/21/2019. Comparison Study: Prior study done 11/16/19, pseudoaneurysm found. Performing Technologist: Sharion Dove RVS  Examination Guidelines: A complete evaluation includes B-mode imaging, spectral Doppler, color Doppler, and power Doppler as needed of all accessible portions of each vessel. Bilateral testing is considered an integral part of a complete examination. Limited examinations for reoccurring indications may be performed as noted.  Findings: A mixed echogenic structure measuring approximately 3.0 cm x 7.6 cm is visualized at the proximal thigh with ultrasound characteristics of a hematoma.  Summary: Pseudoaneurysm remains closed.     --------------------------------------------------------------------------------    Preliminary      PHYSICAL EXAM        Temp:  [98 F (36.7 C)-98.6 F (37 C)] 98.2 F (36.8 C) (10/28 1124) Pulse Rate:  [62-85] 82 (10/28 1124) Resp:  [16-18] 18 (10/28 1124) BP: (114-178)/(58-78) 114/58 (10/28 1124) SpO2:  [94 %-98 %] 95 % (10/28 1124)  General - Well nourished, well developed, no acute distress.  Ophthalmologic - fundi not visualized due to noncooperation.  Cardiovascular - irregularly irregular heart rate and rhythm. Right groin shows tense hematoma which is tender to touch  Mental Status -  Level of arousal and orientation to place, age, month and person were intact, however, not oriented to year. Language exam showed no aphasia, able to name and repeat, follow simple commands.    Cranial Nerves II - XII - II - Visual field intact OU. III, IV, VI - Extraocular movements intact. V - Facial sensation intact bilaterally. VII - subtle left nasolabial fold flattening. VIII - Hearing & vestibular intact bilaterally. X - Palate elevates symmetrically. XI - Chin turning & shoulder shrug intact bilaterally. XII - Tongue protrusion intact.  Motor Strength - The patient's strength was normal in right upper and lower extremities, left upper extremity mildly decreased finger grip, left LE 4/5 proximal and 4+/5 distally.  Bulk was normal and fasciculations were absent. Right lower extremity movements slightly limited due to hip pain Motor Tone - Muscle tone was assessed at the neck and appendages and was normal.  Reflexes - The patient's reflexes were  symmetrical in all extremities and she had no pathological reflexes.  Sensory - Light touch, temperature/pinprick were assessed and were symmetrical.    Coordination - The patient had normal movements in the hands with no ataxia or dysmetria.  Tremor was absent.  Gait and Station - able to stand with assistance off of the commode, hold  on to walker, and pivot to sit in chair.   ASSESSMENT/PLAN Ms. TIJANA WALDER is a 86 y.o. female with history of Afib/flutter s/p ablation, off AC due to persistent sinus rhythm, and a recent diagnosis of pneumonia (she was not improving on the first round of antibiotics tried and therefore was broadened to Augmentin on Monday), presenting with acute onset right gaze preference and left-sided weakness. The patient received IV t-PA Tuesday 11/19 at 10:59 PM and sent to IR for R PCA occlusion.  Stroke: R thalamic, cerebellar, and frontal small infarcts with right PCA occlusion s/p tPA and IR with TICI3 from AF not on AC Small SAH interpeduncular cistern post IR  CT Head - Age indeterminate small vessel disease including in the right thalamus, but no acute cortically based infarct or acute intracranial hemorrhage identified.   CTA H&N - Positive for occlusion of the Right PCA origin. No other large vessel occlusion, and generally mild for age atherosclerosis in the head and neck.   Cerebral angio/ IR - Rt PCA occlusion with TICI 3 flow   CT head - Status post Right PCA endovascular revascularization with trace subarachnoid blood/contrast in the interpeduncular cistern. Small 10 mm area of contrast staining versus petechial hemorrhage in the medial right thalamus.   MRI head - R thalamus infarct with associated mild hemorrhage as noted on recent CT. There is also mild subarachnoid hemorrhage in the interpeduncular cistern unchanged from CT. Additional small areas of acute infarct in the right cerebellum and right frontal cortex. Moderate atrophy   MRA head -  Right PCA patent post thrombectomy. There is a mild stenosis in the right PCA at the P1/ P2 junction.   2D Echo - EF 60-65%. No source of embolus. LA moderately dilated  [employer] Virus 2 - negative  LDL - 26  HgbA1c - 5.7  VTE prophylaxis - Heparin 5000 units sq tid   aspirin 81 mg daily prior to admission, now on Eliquis-> will  put Eliquis on hold d/t return of pseudoaneurysm   Therapy recommendations:  HH OT, HH PT ->  CIR   Disposition:  CIR - no bed today  Medically ready for d/c to CIR when bed available  R Groin Hematoma  Developed hospital D#2 at angio puncture site  Groin pseudoaneurysm [place] 2.0 x 2.4 R CFA pseudoaneurysm w/ hematoma  Thrombin injection 10/22 Earleen Newport)  Repeat US 10/23 - no evidence of pseudoaneurysm   R groin tender this am after putting on  Eliquis 10/24  CTA pelvis 10/26 confirms R common femoral artery pseudoaneurysm with narrow neck, estimated 19 mm x 18 mm, and associated right thigh hematoma.  [facility] [place]) following - for  surgical repair if repeat thrombin injection not successful  repeat thrombin injection 10/27    Repeat US 10/28 - mixed echogenic structure proximal thigh c/w hematoma   Now off bedrest   Acute Respiratory Failure, resolved  Intubated for IR, left intubated post IR   Sedated w/ fentanyl and propofol, now off  Extubated 10/20  Patient stable  CCM signed off   Atrial Fibrillation/Aflutter  Home anticoagulation:  none   post ablation Dr.  TaylorIn 2004. Doing very well. No further recurrences.  Initially no AC given post tPA small SAH   Now off AC d/t recurrent R groin pseudoaneurysm  Follows with Dr. Marlou Porch at [place] Acquired P[facility]  No response to intial abx  Broadened as OP to Augmention 10/18  Still has intermittent coughing  CTA - layering left pleural effusion, patchy left upper lobe opacity compatible with pneumonia  CXR 10/20 - small bilateral pleural effusions. Probable chronic interstitial changes.  WBCs 8.1 (afebrile)  Hypertension  Home BP meds: metoprolol   Treated short term w/ cleviprex, off   BP stable . BP goal < 160  . Long-term BP goal normotensive  Hyperlipidemia  Home Lipid lowering medication: Lipitor 10 mg daily  LDL 26, goal < 70  Will not resume lipitor due to low LDL  level  Dysphagia, resolved . Secondary to stroke . Cleared for regular consistency, thin liquid diet . Off IVF  . Sore throat after extubation   Other Stroke Risk Factors  Advanced age  Former cigarette smoker - quit  ETOH use, advised to drink no more than 1 alcoholic beverage per day.  Other Active Problems  Emphysema (PNP00-F11.9)   Aortic Atherosclerosis (ICD10-I70.0)   Anemia - Hgb - 12.8->13.3-> 10.5->9.8->8.8->9.1->8.4->8.5->8.8  Hypokalemia - K 3.6 - resolved  Hospital day # 9 Plan mobilize out of bed.  Ongoing therapies.  Start Eliquis.  Transfer to rehab hopefully the next few days if bed available.  Long discussion patient and daughter and answered questions.  Greater than 50% time during this 25-minute visit was spent on counseling and coordination of care about her stroke and groin pseudoaneurysm and groin hematoma and answering questions Antony Contras, MD   To contact Stroke Continuity provider, please refer to [email or web address] After hours, contact General Neurology

## 2019-11-22 NOTE — Progress Notes (Signed)
Physical Therapy Treatment Patient Details Name: [name] MRN: [id number] DOB: 17-Nov-1931 Today's Date: 11/22/2019    History of Present Illness 86 yo with acute right thalamic infarct s/p thrombectomy of Rt PCA 10/20 with revascularization, extubated 10/20. s/p image-guided right femoral thrombin injection on 10/27 to address R femoral pseudoaneurysm. PMHx:Afib, SVT    PT Comments    PT and OT saw pt in conjunction to progress pt mobility after 2 days on bedrest. Pt requiring mod assist +2 for transfer OOB today, pt limited by LE weakness and fatigue. PT instructed pt in LE exercise to address weakness, as pt's goal for therapy today was to "get stronger". Pt is disheartened due to medical setbacks and subsequent deconditioning, but is eager to progress mobility. Pt remains a great CIR candidate.     Follow Up Recommendations  CIR     Equipment Recommendations  Wheelchair (measurements PT);Wheelchair cushion (measurements PT);Rolling walker with 5" wheels;3in1 (PT) (defer to next venue)    Recommendations for Other Services       Precautions / Restrictions Precautions Precautions: Fall Precaution Comments: watch BP, dizziness Restrictions Weight Bearing Restrictions: No    Mobility  Bed Mobility Overal bed mobility: Needs Assistance Bed Mobility: Rolling;Sidelying to Sit Rolling: Mod assist Sidelying to sit: Mod assist;+2 for safety/equipment       General bed mobility comments: Mod A to elevate trunk into sitting, increased ability to scoot EOB  Transfers Overall transfer level: Needs assistance Equipment used: Rolling walker (2 wheeled) Transfers: Sit to/from [facility] Sit to Stand: Mod assist;+2 safety/equipment Stand pivot transfers: Mod assist;+2 safety/equipment       General transfer comment: Mod assist +2 for power up, steadying, VCs for hand placement, sequencing, RW management, and slow eccentric lower into  recliner.  Ambulation/Gait             General Gait Details: unable this day - could not tolerate   Stairs             Wheelchair Mobility    Modified Rankin (Stroke Patients Only) Modified Rankin (Stroke Patients Only) Pre-Morbid Rankin Score: No symptoms Modified Rankin: Moderately severe disability     Balance Overall balance assessment: Needs assistance Sitting-balance support: Bilateral upper extremity supported;Feet supported Sitting balance-Leahy Scale: Poor Sitting balance - Comments: requires posterior and lateral postural corrections, at least min A for all EOB tasks Postural control: Posterior lean;Left lateral lean Standing balance support: Bilateral upper extremity supported Standing balance-Leahy Scale: Poor Standing balance comment: reliant on BUE support and external assist                            Cognition Arousal/Alertness: Awake/alert Behavior During Therapy: WFL for tasks assessed/performed Overall Cognitive Status: Impaired/Different from baseline Area of Impairment: Awareness;Following commands;Safety/judgement;Problem solving;Memory                   Current Attention Level: Sustained Memory: Decreased short-term memory Following Commands: Follows one step commands with increased time Safety/Judgement: Decreased awareness of deficits;Decreased awareness of safety Awareness: Intellectual Problem Solving: Slow processing;Difficulty sequencing;Decreased initiation;Requires verbal cues General Comments: Requires step by step cues to complete tasks, occasionally requiring multi-modal cues for initiation, states she wants to get stronger one moment and transfer to chair, then loses place and will prompt "what are we doing"      Exercises General Exercises - Lower Extremity Long Arc Quad: AROM;Both;15 reps;Seated Hip Flexion/Marching: AROM;Both;Seated;10 reps    General  Comments        Pertinent Vitals/Pain Pain  Assessment: Faces Faces Pain Scale: Hurts little more Pain Location: R groin Pain Descriptors / Indicators: Sore;Discomfort Pain Intervention(s): Limited activity within patient's tolerance;Monitored during session;Repositioned    Home Living                      Prior Function            PT Goals (current goals can now be found in the care plan section) Acute Rehab PT Goals Patient Stated Goal: to improve strength and mobility PT Goal Formulation: With patient/family Time For Goal Achievement: 11/29/19 Potential to Achieve Goals: Good Progress towards PT goals: Progressing toward goals    Frequency    Min 4X/week      PT Plan Current plan remains appropriate    Co-evaluation   Reason for Co-Treatment: For patient/therapist safety;To address functional/ADL transfers PT goals addressed during session: Mobility/safety with mobility;Balance;Proper use of DME        AM-PAC PT "6 Clicks" Mobility   Outcome Measure  Help needed turning from your back to your side while in a flat bed without using bedrails?: A Little Help needed moving from lying on your back to sitting on the side of a flat bed without using bedrails?: A Lot Help needed moving to and from a bed to a chair (including a wheelchair)?: A Lot Help needed standing up from a chair using your arms (e.g., wheelchair or bedside chair)?: A Lot Help needed to walk in hospital room?: A Lot Help needed climbing 3-5 steps with a railing? : Total 6 Click Score: 12    End of Session Equipment Utilized During Treatment: Gait belt Activity Tolerance: Patient tolerated treatment well;Patient limited by fatigue Patient left: in chair;with family/visitor present;with call bell/phone within reach Nurse Communication: Mobility status PT Visit Diagnosis: Difficulty in walking, not elsewhere classified (R26.2);Other abnormalities of gait and mobility (R26.89)     Time: 7544-9201 PT Time Calculation (min) (ACUTE  ONLY): 25 min  Charges:  $Therapeutic Activity: 8-22 mins                    Quentina Fronek E, PT Acute Rehabilitation Services Pager [phone]  Office [phone]     Darrelle Barrell D Elonda Husky 11/22/2019, 3:29 PM

## 2019-11-22 NOTE — Progress Notes (Signed)
Inpatient Rehabilitation-Admissions Coordinator   Confirmed interest in CIR with pt and her daughter today. Will follow up tomorrow for potential admit, pending medical readiness and bed availability.   Raechel Ache, OTR/L  Rehab Admissions Coordinator  [phone] 11/22/2019 4:45 PM

## 2019-11-23 ENCOUNTER — Other Ambulatory Visit: Payer: Self-pay

## 2019-11-23 ENCOUNTER — Inpatient Hospital Stay (HOSPITAL_COMMUNITY)
Admission: RE | Admit: 2019-11-23 | Discharge: 2019-12-10 | DRG: 057 | Disposition: A | Discharge status: Home Health | Payer: Medicare Other | Source: Intra-hospital | Attending: Physical Medicine & Rehabilitation | Admitting: Physical Medicine & Rehabilitation

## 2019-11-23 DIAGNOSIS — I4891 Unspecified atrial fibrillation: Secondary | ICD-10-CM | POA: Diagnosis present

## 2019-11-23 DIAGNOSIS — A020 Salmonella enteritis: Secondary | ICD-10-CM | POA: Diagnosis not present

## 2019-11-23 DIAGNOSIS — D62 Acute posthemorrhagic anemia: Secondary | ICD-10-CM | POA: Diagnosis present

## 2019-11-23 DIAGNOSIS — R509 Fever, unspecified: Secondary | ICD-10-CM | POA: Diagnosis not present

## 2019-11-23 DIAGNOSIS — M7981 Nontraumatic hematoma of soft tissue: Secondary | ICD-10-CM | POA: Diagnosis not present

## 2019-11-23 DIAGNOSIS — I482 Chronic atrial fibrillation, unspecified: Secondary | ICD-10-CM | POA: Diagnosis not present

## 2019-11-23 DIAGNOSIS — Z881 Allergy status to other antibiotic agents status: Secondary | ICD-10-CM | POA: Diagnosis not present

## 2019-11-23 DIAGNOSIS — I724 Aneurysm of artery of lower extremity: Secondary | ICD-10-CM | POA: Diagnosis not present

## 2019-11-23 DIAGNOSIS — Z87440 Personal history of urinary (tract) infections: Secondary | ICD-10-CM

## 2019-11-23 DIAGNOSIS — Z87891 Personal history of nicotine dependence: Secondary | ICD-10-CM | POA: Diagnosis not present

## 2019-11-23 DIAGNOSIS — K5901 Slow transit constipation: Secondary | ICD-10-CM

## 2019-11-23 DIAGNOSIS — I700 Atherosclerosis of aorta: Secondary | ICD-10-CM | POA: Diagnosis present

## 2019-11-23 DIAGNOSIS — Z91038 Other insect allergy status: Secondary | ICD-10-CM | POA: Diagnosis not present

## 2019-11-23 DIAGNOSIS — Z885 Allergy status to narcotic agent status: Secondary | ICD-10-CM | POA: Diagnosis not present

## 2019-11-23 DIAGNOSIS — R3915 Urgency of urination: Secondary | ICD-10-CM | POA: Diagnosis present

## 2019-11-23 DIAGNOSIS — Z882 Allergy status to sulfonamides status: Secondary | ICD-10-CM | POA: Diagnosis not present

## 2019-11-23 DIAGNOSIS — I63531 Cerebral infarction due to unspecified occlusion or stenosis of right posterior cerebral artery: Secondary | ICD-10-CM

## 2019-11-23 DIAGNOSIS — Z7982 Long term (current) use of aspirin: Secondary | ICD-10-CM | POA: Diagnosis not present

## 2019-11-23 DIAGNOSIS — I10 Essential (primary) hypertension: Secondary | ICD-10-CM | POA: Diagnosis present

## 2019-11-23 DIAGNOSIS — T81718A Complication of other artery following a procedure, not elsewhere classified, initial encounter: Secondary | ICD-10-CM | POA: Diagnosis not present

## 2019-11-23 DIAGNOSIS — E876 Hypokalemia: Secondary | ICD-10-CM | POA: Diagnosis not present

## 2019-11-23 DIAGNOSIS — I69354 Hemiplegia and hemiparesis following cerebral infarction affecting left non-dominant side: Principal | ICD-10-CM

## 2019-11-23 DIAGNOSIS — I639 Cerebral infarction, unspecified: Secondary | ICD-10-CM

## 2019-11-23 DIAGNOSIS — Z91030 Bee allergy status: Secondary | ICD-10-CM | POA: Diagnosis not present

## 2019-11-23 DIAGNOSIS — I729 Aneurysm of unspecified site: Secondary | ICD-10-CM

## 2019-11-23 DIAGNOSIS — Z792 Long term (current) use of antibiotics: Secondary | ICD-10-CM

## 2019-11-23 DIAGNOSIS — R197 Diarrhea, unspecified: Secondary | ICD-10-CM | POA: Diagnosis not present

## 2019-11-23 DIAGNOSIS — N390 Urinary tract infection, site not specified: Secondary | ICD-10-CM

## 2019-11-23 DIAGNOSIS — R350 Frequency of micturition: Secondary | ICD-10-CM | POA: Diagnosis not present

## 2019-11-23 DIAGNOSIS — Z8249 Family history of ischemic heart disease and other diseases of the circulatory system: Secondary | ICD-10-CM | POA: Diagnosis not present

## 2019-11-23 DIAGNOSIS — B961 Klebsiella pneumoniae [K. pneumoniae] as the cause of diseases classified elsewhere: Secondary | ICD-10-CM | POA: Diagnosis present

## 2019-11-23 DIAGNOSIS — Z79899 Other long term (current) drug therapy: Secondary | ICD-10-CM | POA: Diagnosis not present

## 2019-11-23 DIAGNOSIS — J189 Pneumonia, unspecified organism: Secondary | ICD-10-CM | POA: Diagnosis present

## 2019-11-23 DIAGNOSIS — J439 Emphysema, unspecified: Secondary | ICD-10-CM | POA: Diagnosis present

## 2019-11-23 DIAGNOSIS — E785 Hyperlipidemia, unspecified: Secondary | ICD-10-CM | POA: Diagnosis present

## 2019-11-23 DIAGNOSIS — I480 Paroxysmal atrial fibrillation: Secondary | ICD-10-CM | POA: Diagnosis present

## 2019-11-23 DIAGNOSIS — J90 Pleural effusion, not elsewhere classified: Secondary | ICD-10-CM | POA: Diagnosis not present

## 2019-11-23 MED ORDER — POLYETHYLENE GLYCOL 3350 17 G PO PACK
17.0000 g | PACK | Freq: Every day | ORAL | Status: DC
  Administered 2019-11-24 – 2019-11-27 (×4): 17 g via ORAL
  Filled 2019-11-23 (×5): qty 1

## 2019-11-23 MED ORDER — BISACODYL 10 MG RE SUPP: 10.0000 mg | Freq: Every day | RECTAL | Status: DC | PRN

## 2019-11-23 MED ORDER — PROCHLORPERAZINE EDISYLATE 10 MG/2ML IJ SOLN: 5.0000 mg | Freq: Four times a day (QID) | INTRAMUSCULAR | Status: DC | PRN

## 2019-11-23 MED ORDER — ACETAMINOPHEN 325 MG PO TABS: 650.0000 mg | ORAL_TABLET | ORAL | Status: DC | PRN

## 2019-11-23 MED ORDER — APIXABAN 5 MG PO TABS
5.0000 mg | ORAL_TABLET | Freq: Two times a day (BID) | ORAL | Status: DC
  Administered 2019-11-23 – 2019-12-10 (×34): 5 mg via ORAL
  Filled 2019-11-23 (×22): qty 1
  Filled 2019-11-23: qty 2
  Filled 2019-11-23 (×11): qty 1

## 2019-11-23 MED ORDER — BISACODYL 10 MG RE SUPP: 10.0000 mg | Freq: Every day | RECTAL | 0 refills | Status: DC | PRN

## 2019-11-23 MED ORDER — ALUM & MAG HYDROXIDE-SIMETH 200-200-20 MG/5ML PO SUSP: 30.0000 mL | ORAL | Status: DC | PRN

## 2019-11-23 MED ORDER — ACETAMINOPHEN 325 MG PO TABS
325.0000 mg | ORAL_TABLET | ORAL | Status: DC | PRN
  Administered 2019-11-24 – 2019-12-04 (×4): 650 mg via ORAL
  Filled 2019-11-23 (×5): qty 2

## 2019-11-23 MED ORDER — MELATONIN 3 MG PO TABS: 3.0000 mg | ORAL_TABLET | Freq: Every evening | ORAL | Status: DC | PRN

## 2019-11-23 MED ORDER — PANTOPRAZOLE SODIUM 40 MG PO TBEC
40.0000 mg | DELAYED_RELEASE_TABLET | Freq: Every day | ORAL | Status: DC
  Administered 2019-11-24 – 2019-12-01 (×8): 40 mg via ORAL
  Filled 2019-11-23 (×8): qty 1

## 2019-11-23 MED ORDER — FLEET ENEMA 7-19 GM/118ML RE ENEM: 1.0000 | ENEMA | Freq: Once | RECTAL | Status: DC | PRN

## 2019-11-23 MED ORDER — POLYETHYLENE GLYCOL 3350 17 G PO PACK: 17.0000 g | PACK | Freq: Every day | ORAL | 0 refills | Status: DC

## 2019-11-23 MED ORDER — APIXABAN 5 MG PO TABS: 5.0000 mg | ORAL_TABLET | Freq: Two times a day (BID) | ORAL | Status: DC

## 2019-11-23 MED ORDER — PROCHLORPERAZINE MALEATE 5 MG PO TABS
5.0000 mg | ORAL_TABLET | Freq: Four times a day (QID) | ORAL | Status: DC | PRN
  Administered 2019-12-04: 10 mg via ORAL
  Filled 2019-11-23: qty 2
  Filled 2019-11-23: qty 1

## 2019-11-23 MED ORDER — DIPHENHYDRAMINE HCL 12.5 MG/5ML PO ELIX: 12.5000 mg | ORAL_SOLUTION | Freq: Four times a day (QID) | ORAL | Status: DC | PRN

## 2019-11-23 MED ORDER — GUAIFENESIN-DM 100-10 MG/5ML PO SYRP: 5.0000 mL | ORAL_SOLUTION | Freq: Four times a day (QID) | ORAL | Status: DC | PRN

## 2019-11-23 MED ORDER — DOCUSATE SODIUM 100 MG PO CAPS: 100.0000 mg | ORAL_CAPSULE | Freq: Two times a day (BID) | ORAL | 0 refills | Status: DC

## 2019-11-23 MED ORDER — POLYETHYLENE GLYCOL 3350 17 G PO PACK: 17.0000 g | PACK | Freq: Every day | ORAL | Status: DC | PRN

## 2019-11-23 MED ORDER — DOCUSATE SODIUM 100 MG PO CAPS
100.0000 mg | ORAL_CAPSULE | Freq: Two times a day (BID) | ORAL | Status: DC
  Administered 2019-11-23 – 2019-11-27 (×9): 100 mg via ORAL
  Filled 2019-11-23 (×10): qty 1

## 2019-11-23 MED ORDER — SENNOSIDES-DOCUSATE SODIUM 8.6-50 MG PO TABS: 2.0000 | ORAL_TABLET | Freq: Every evening | ORAL | Status: DC | PRN

## 2019-11-23 MED ORDER — PROCHLORPERAZINE 25 MG RE SUPP: 12.5000 mg | Freq: Four times a day (QID) | RECTAL | Status: DC | PRN

## 2019-11-23 NOTE — Discharge Instructions (Addendum)
Inpatient Rehab Discharge Instructions  [name] Discharge date and time:    Activities/Precautions/ Functional Status: Activity: no lifting, driving, or strenuous exercise till cleared by MD Diet: cardiac diet Wound Care: keep wound clean and dry    Functional status:  ___ No restrictions     ___ Walk up steps independently ___ 24/7 supervision/assistance   ___ Walk up steps with assistance ___ Intermittent supervision/assistance  ___ Bathe/dress independently ___ Walk with walker     ___ Bathe/dress with assistance ___ Walk Independently    ___ Shower independently ___ Walk with assistance    ___ Shower with assistance ___ No alcohol     ___ Return to work/school ________ COMMUNITY REFERRALS UPON DISCHARGE:    Home Health:   PT     OT                   Agency: [facility] Phone: [phone]    Medical Equipment/Items Ordered: Rolling Walker                                                 Agency/Supplier: [employer]  Special Instructions:  STROKE/TIA DISCHARGE INSTRUCTIONS SMOKING Cigarette smoking nearly doubles your risk of having a stroke & is the single most alterable risk factor  If you smoke or have smoked in the last 12 months, you are advised to quit smoking for your health.  Most of the excess cardiovascular risk related to smoking disappears within a year of stopping.  Ask you doctor about anti-smoking medications  [place] Quit Line: 1-800-QUIT NOW  Free Smoking Cessation Classes [phone]  CHOLESTEROL Know your levels; limit fat & cholesterol in your diet  Lipid Panel     Component Value Date/Time   CHOL 70 11/14/2019 0408   TRIG 126 11/14/2019 0408   TRIG 127 11/14/2019 0408   HDL 19 (L) 11/14/2019 0408   CHOLHDL 3.7 11/14/2019 0408   VLDL 25 11/14/2019 0408   LDLCALC 26 11/14/2019 0408      Many patients benefit from treatment even if their cholesterol is at goal.  Goal: Total Cholesterol (CHOL) less than 160  Goal:   Triglycerides (TRIG) less than 150  Goal:  HDL greater than 40  Goal:  LDL (LDLCALC) less than 100   BLOOD PRESSURE American Stroke Association blood pressure target is less that 120/80 mm/Hg  Your discharge blood pressure is:  BP: 115/61  Monitor your blood pressure  Limit your salt and alcohol intake  Many individuals will require more than one medication for high blood pressure  DIABETES (A1c is a blood sugar average for last 3 months) Goal HGBA1c is under 7% (HBGA1c is blood sugar average for last 3 months)  Diabetes:     Lab Results  Component Value Date   HGBA1C 5.7 (H) 11/14/2019     Your HGBA1c can be lowered with medications, healthy diet, and exercise.  Check your blood sugar as directed by your physician  Call your physician if you experience unexplained or low blood sugars.  PHYSICAL ACTIVITY/REHABILITATION Goal is 30 minutes at least 4 days per week  Activity: No driving, Therapies: see above Return to work: N/a  Activity decreases your risk of heart attack and stroke and makes your heart stronger.  It helps control your weight and blood pressure; helps you relax  and can improve your mood.  Participate in a regular exercise program.  Talk with your doctor about the best form of exercise for you (dancing, walking, swimming, cycling).  DIET/WEIGHT Goal is to maintain a healthy weight  Your discharge diet is:  Diet Order            DIET SOFT Room service appropriate? Yes; Fluid consistency: Thin  Diet effective now                liquids Your height is:  Height: 5' (152.4 cm) Your current weight is: Weight: 63.2 kg Your Body Mass Index (BMI) is:  BMI (Calculated): 27.21  Following the type of diet specifically designed for you will help prevent another stroke.  Your goal weight is  Your goal Body Mass Index (BMI) is 19-24.  Healthy food habits can help reduce 3 risk factors for stroke:  High cholesterol, hypertension, and excess weight.  RESOURCES  Stroke/Support Group:  Call [phone]   STROKE EDUCATION PROVIDED/REVIEWED AND GIVEN TO PATIENT Stroke warning signs and symptoms How to activate emergency medical system (call 911). Medications prescribed at discharge. Need for follow-up after discharge. Personal risk factors for stroke. Pneumonia vaccine given:  Flu vaccine given:  My questions have been answered, the writing is legible, and I understand these instructions.  I will adhere to these goals & educational materials that have been provided to me after my discharge from the hospital.     My questions have been answered and I understand these instructions. I will adhere to these goals and the provided educational materials after my discharge from the hospital.  Patient/Caregiver Signature _______________________________ Date __________  Clinician Signature _______________________________________ Date __________  Please bring this form and your medication list with you to all your follow-up doctor's appointments. Information on my medicine - ELIQUIS (apixaban)  This medication education was reviewed with me or my healthcare representative as part of my discharge preparation.  Why was Eliquis prescribed for you? Eliquis was prescribed for you to reduce the risk of a blood clot forming that can cause a stroke if you have a medical condition called atrial fibrillation (a type of irregular heartbeat).  What do You need to know about Eliquis ? Take your Eliquis TWICE DAILY - one tablet in the morning and one tablet in the evening with or without food. If you have difficulty swallowing the tablet whole please discuss with your pharmacist how to take the medication safely.  Take Eliquis exactly as prescribed by your doctor and DO NOT stop taking Eliquis without talking to the doctor who prescribed the medication.  Stopping may increase your risk of developing a stroke.  Refill your prescription before you run out.  After  discharge, you should have regular check-up appointments with your healthcare provider that is prescribing your Eliquis.  In the future your dose may need to be changed if your kidney function or weight changes by a significant amount or as you get older.  What do you do if you miss a dose? If you miss a dose, take it as soon as you remember on the same day and resume taking twice daily.  Do not take more than one dose of ELIQUIS at the same time to make up a missed dose.  Important Safety Information A possible side effect of Eliquis is bleeding. You should call your healthcare provider right away if you experience any of the following: ? Bleeding from an injury or your nose that does not stop. ?  Unusual colored urine (red or dark brown) or unusual colored stools (red or black). ? Unusual bruising for unknown reasons. ? A serious fall or if you hit your head (even if there is no bleeding).  Some medicines may interact with Eliquis and might increase your risk of bleeding or clotting while on Eliquis. To help avoid this, consult your healthcare provider or pharmacist prior to using any new prescription or non-prescription medications, including herbals, vitamins, non-steroidal anti-inflammatory drugs (NSAIDs) and supplements.  This website has more information on Eliquis (apixaban): http://[email or web address].com/eliquis/home

## 2019-11-23 NOTE — Progress Notes (Signed)
[place] for apixaban Indication: atrial fibrillation  Labs: Recent Labs    11/21/19 0411  HGB 8.8*  HCT 27.5*  PLT 346  CREATININE 0.65    Assessment: 83 yof with hx afib/aflutter s/p ablation, off anticoagulation PTA presenting with acute ischemic stroke, s/p TPA 10/19 PM. Noted, no anticoagulation initially started with small SAH s/p TPA and recurrent R groin pseudoaneurysm s/p thrombin injection with obliteration in IR 10/27. R groin hematoma stable per notes. Pharmacy consulted by Neurology to resume apixaban 10/28. SCr wnl, Hg low stable 8.8, plt wnl. No current active bleed issues reported.  Goal of Therapy:  Stroke prevention Monitor platelets by anticoagulation protocol: Yes   Plan:  Apixaban 5mg  PO BID Monitor CBC, s/sx bleeding   Elicia Lamp, PharmD, BCPS Clinical Pharmacist 11/23/2019 2:01 PM

## 2019-11-23 NOTE — Discharge Summary (Addendum)
Stroke Discharge Summary  Patient ID: [name]   MRN: [id number]      DOB: 1931-12-29  Date of Admission: 11/13/2019 Date of Discharge: 11/23/2019  Attending Physician:  Garvin Fila, MD, Stroke MD Consultant(s):   Corrie Mckusick, DO (Interventional Neuroradiologist),  Georges Mouse, DO, (pulmonary/intensive care), Deitra Mayo, MD (vascular surgery), Alysia Penna, MD (Physical Medicine & Rehabilitation)  Patient's PCP:  Crist Infante, MD  Discharge Diagnoses:  Principal Problem:   Acute ischemic right PCA stroke [facility]) s/p tPA and mechanical thrombectomy, d/t AF not on [facility] Active Problems:   Atrial fibrillation ([place])   Atrial flutter ([place])   Stroke ([place])   Pseudoaneurysm of femoral artery following procedure ([place])   CAP (community acquired pneumonia)   Essential hypertension   Hyperlipidemia LDL goal <70   Emphysema of lung ([place])   Aortic atherosclerosis ([place])   Acute blood loss anemia   Medications to be continued on Rehab Allergies as of 11/23/2019       Reactions   Bee Venom Anaphylaxis   Mixed Vespid Venom Anaphylaxis   Codeine Other (See Comments)   Not known   Sulfa Antibiotics Itching, Nausea Only   Erythromycin Nausea And Vomiting        Medication List     STOP taking these medications    amoxicillin-clavulanate 875-125 MG tablet Commonly known as: AUGMENTIN   aspirin 81 MG tablet   atorvastatin 10 MG tablet Commonly known as: LIPITOR   Investigational - Study Medication   metoprolol succinate 50 MG 24 hr tablet Commonly known as: TOPROL-XL   trimethoprim 100 MG tablet Commonly known as: TRIMPEX       TAKE these medications    acetaminophen 325 MG tablet Commonly known as: TYLENOL Take 2 tablets (650 mg total) by mouth every 4 (four) hours as needed for mild pain (or temp > 37.5 C (99.5 F)).   apixaban 5 MG Tabs tablet Commonly known as: ELIQUIS Take 1 tablet (5 mg total) by mouth 2 (two) times daily.   BIOTIN  PO Take 1 tablet by mouth daily.   bisacodyl 10 MG suppository Commonly known as: DULCOLAX Place 1 suppository (10 mg total) rectally daily as needed for moderate constipation.   CALCIUM PO Take 1 tablet by mouth daily.   CO Q-10 PO Take 1 tablet by mouth daily.   docusate sodium 100 MG capsule Commonly known as: COLACE Take 1 capsule (100 mg total) by mouth 2 (two) times daily.   EPINEPHrine 0.3 mg/0.3 mL Soaj injection Commonly known as: EPI-PEN Inject 0.3 mg into the muscle as needed for anaphylaxis.   polyethylene glycol 17 g packet Commonly known as: MIRALAX / GLYCOLAX Take 17 g by mouth daily. Start taking on: November 24, 2019   senna-docusate 8.6-50 MG tablet Commonly known as: Senokot-S Take 2 tablets by mouth at bedtime as needed for mild constipation or moderate constipation.   vitamin D3 50 MCG (2000 UT) Caps Take 2,000 Units by mouth daily.               Discharge Care Instructions  (From admission, onward)           Start     Ordered   11/23/19 0000  Discharge wound care:       Comments: Apply dry dressing to the R CFA access site daily. Manual compression x 20 mins if futher bleeding, place compression dressing and call NIR on call (see amion)   11/23/19 1257  LABORATORY STUDIES CBC    Component Value Date/Time   WBC 8.2 11/21/2019 0411   RBC 2.79 (L) 11/21/2019 0411   HGB 8.8 (L) 11/21/2019 0411   HCT 27.5 (L) 11/21/2019 0411   PLT 346 11/21/2019 0411   MCV 98.6 11/21/2019 0411   MCH 31.5 11/21/2019 0411   MCHC 32.0 11/21/2019 0411   RDW 13.0 11/21/2019 0411   LYMPHSABS 1.5 11/13/2019 2244   MONOABS 0.8 11/13/2019 2244   EOSABS 0.2 11/13/2019 2244   BASOSABS 0.1 11/13/2019 2244   CMP    Component Value Date/Time   NA 137 11/21/2019 0411   K 3.6 11/21/2019 0411   CL 104 11/21/2019 0411   CO2 20 (L) 11/21/2019 0411   GLUCOSE 107 (H) 11/21/2019 0411   BUN 7 (L) 11/21/2019 0411   CREATININE 0.65 11/21/2019 0411    CALCIUM 8.2 (L) 11/21/2019 0411   PROT 5.1 (L) 11/15/2019 0408   ALBUMIN 2.4 (L) 11/15/2019 0408   AST 24 11/15/2019 0408   ALT 32 11/15/2019 0408   ALKPHOS 37 (L) 11/15/2019 0408   BILITOT 0.9 11/15/2019 0408   GFRNONAA >60 11/21/2019 0411   COAGS Lab Results  Component Value Date   INR 1.0 11/13/2019   Lipid Panel    Component Value Date/Time   CHOL 70 11/14/2019 0408   TRIG 126 11/14/2019 0408   TRIG 127 11/14/2019 0408   HDL 19 (L) 11/14/2019 0408   CHOLHDL 3.7 11/14/2019 0408   VLDL 25 11/14/2019 0408   LDLCALC 26 11/14/2019 0408   HgbA1C  Lab Results  Component Value Date   HGBA1C 5.7 (H) 11/14/2019   Alcohol Level    Component Value Date/Time   ETH <10 11/13/2019 2244     SIGNIFICANT DIAGNOSTIC STUDIES CT Code Stroke CTA Head W/WO contrast  Result Date: 11/13/2019 CLINICAL DATA:  86 year old female code stroke presentation. Presentation suspicious for LV0, receiving IV tPA. History also of recent outpatient treatment for pneumonia. EXAM: CT ANGIOGRAPHY HEAD AND NECK TECHNIQUE: Multidetector CT imaging of the head and neck was performed using the standard protocol during bolus administration of intravenous contrast. Multiplanar CT image reconstructions and MIPs were obtained to evaluate the vascular anatomy. Carotid stenosis measurements (when applicable) are obtained utilizing NASCET criteria, using the distal internal carotid diameter as the denominator. CONTRAST:  60 mL Omnipaque 350. COMPARISON:  Plain head CT 2253 hours today. FINDINGS: CTA NECK Skeleton: Widespread advanced cervical spine degeneration. No acute osseous abnormality identified. Upper chest: Small to moderate layering left pleural effusion. Moderate to severe emphysema in the visible upper lungs. Patchy additional peripheral left upper lobe opacity. No superior mediastinal lymphadenopathy. Other neck: No acute findings. Aortic arch: Mild to moderate arch atherosclerosis. Three vessel arch  configuration. Right carotid system: Brachiocephalic artery and right CCA origin within normal limits. Mild plaque proximal to the bifurcation without stenosis. Mild soft plaque at the medial right ICA origin without stenosis. Partially retropharyngeal course of the right ICA without stenosis to the skull base. Left carotid system: Mild plaque at the left CCA origin and proximal to the bifurcation without stenosis. Negative bifurcation. Tortuous left ICA with a partially retropharyngeal course, no stenosis. Vertebral arteries: Proximal right subclavian artery plaque without stenosis. Normal right vertebral artery origin. Right vertebral artery is patent and within normal limits to the skull base. Mild to moderate proximal left subclavian artery plaque without significant stenosis. Normal left vertebral artery origin. Tortuous left V1 segment. Left vertebral artery is fairly codominant and patent to the  skull base without stenosis. CTA HEAD Posterior circulation: Codominant distal vertebral arteries are patent to the vertebrobasilar junction without plaque or stenosis. The a ICAs may be dominant. Patent basilar artery without stenosis. Patent SCA and left PCA origins. However, the right PCA is occluded just beyond its origin (series 11, image 26) with poor reconstitution distally. The left PCA P2 and P3 segments are mildly irregular with mild stenosis. Anterior circulation: Both ICA siphons are patent. Mild calcified plaque on the left. Mild left supraclinoid ICA stenosis. Similar mild calcified plaque on the right and mild supraclinoid stenosis. Patent carotid termini. Posterior communicating arteries are diminutive or absent. Patent MCA and ACA origins. The left ACA is dominant and the right is diminutive with subsequent azygos type ACA anatomy. No definite ACA occlusion. Left MCA M1 segment and trifurcation are patent without stenosis. Right MCA M1 segment and bifurcation are patent without stenosis. Bilateral MCA  branches appear patent with mild irregularity. Venous sinuses: Early contrast timing, not evaluated. Anatomic variants: Dominant left ACA with azygos type ACA anatomy. Review of the MIP images confirms the above findings IMPRESSION: 1. Positive for occlusion of the Right PCA origin. 2. No other large vessel occlusion, and generally mild for age atherosclerosis in the head and neck. 3. Emphysema (ICD10-J43.9) with layering left pleural effusion, patchy left upper lobe opacity compatible with pneumonia in this setting. 4. Aortic Atherosclerosis (ICD10-I70.0) Preliminary report of the above discussed by telephone with Dr. Lesleigh Noe on 11/13/2019 at 2314 hours. Electronically Signed   By: Genevie Ann M.D.   On: 11/13/2019 23:25   CT HEAD WO CONTRAST  Result Date: 11/14/2019 CLINICAL DATA:  86 year old female approaching 24 hours status post IV tPA and NIR revascularization of the right PCA. EXAM: CT HEAD WITHOUT CONTRAST TECHNIQUE: Contiguous axial images were obtained from the base of the skull through the vertex without intravenous contrast. COMPARISON:  Post treatment head CT 0210 hours today, brain MRI and intracranial MRA 0716 hours today. FINDINGS: Brain: Stable to slightly regressed trace subarachnoid blood in the interpeduncular cistern (series 3, image 13). No intraventricular hemorrhage or other extra-axial blood identified. Resolved contrast staining or less likely evolved intra-axial blood products associated with the patchy right thalamic infarct (series 3, image 16 now). No significant mass effect. Gray-white matter differentiation elsewhere remains stable. No new areas of cytotoxic edema. Stable ventricle size and configuration. No intracranial mass effect. Vascular: Calcified atherosclerosis at the skull base. Skull: No acute osseous abnormality identified. Sinuses/Orbits: Improved sinus aeration. Mild to moderate residual mucosal thickening most pronounced in the right sphenoid. Bilateral mastoid  effusions persist. Other: Negative orbit and scalp soft tissues. IMPRESSION: 1. Stable patchy right thalamic infarct with resolved contrast staining. No significant mass effect. No other acute or evolving ischemia by CT. 2. Stable to slightly regressed trace subarachnoid blood in the interpeduncular cistern. No other intracranial hemorrhage. Electronically Signed   By: Genevie Ann M.D.   On: 11/14/2019 21:43   CT HEAD WO CONTRAST  Result Date: 11/14/2019 CLINICAL DATA:  86 year old female code stroke presentation with right PCA occlusion. Status post Neuro Interventional endovascular revascularization. EXAM: CT HEAD WITHOUT CONTRAST TECHNIQUE: Contiguous axial images were obtained from the base of the skull through the vertex without intravenous contrast. COMPARISON:  Head CT 2253 hours 11/13/2019. FINDINGS: Brain: There is a small subarachnoid collection of blood and contrast in the interpeduncular cistern (series 3, image 13). The other basilar cisterns remain normal. No intraventricular blood. Small and indistinct 10 mm area of contrast  staining versus petechial hemorrhage in the medial right thalamus (series 3, image 16). Asymmetric right thalamic hypodense heterogeneity elsewhere appears slightly increased from the presentation CT. No cytotoxic edema identified elsewhere in the right PCA territory. Stable gray-white matter differentiation elsewhere. No other abnormal enhancement or intracranial blood identified. Vascular: Residual intravascular contrast. Skull: No acute osseous abnormality identified. Sinuses/Orbits: Small fluid levels and mild mucosal thickening now in the sinuses. Stable mild mastoid effusions. Other: Intubated. Small volume fluid in the pharynx. No acute orbit or scalp soft tissue findings. IMPRESSION: 1. Status post Right PCA endovascular revascularization with trace subarachnoid blood/contrast in the interpeduncular cistern. 2. Small 10 mm area of contrast staining versus petechial  hemorrhage in the medial right thalamus. And question subtle increased right thalamic hypodensity elsewhere, but no other Right PCA territory infarct changes are identified. These results were communicated to Dr. Curly Shores at 2:25 am on 11/14/2019 by text page via the [facility] messaging system. Electronically Signed   By: Genevie Ann M.D.   On: 11/14/2019 02:26   CT Code Stroke CTA Neck W/WO contrast  Result Date: 11/13/2019 CLINICAL DATA:  86 year old female code stroke presentation. Presentation suspicious for LV0, receiving IV tPA. History also of recent outpatient treatment for pneumonia. EXAM: CT ANGIOGRAPHY HEAD AND NECK TECHNIQUE: Multidetector CT imaging of the head and neck was performed using the standard protocol during bolus administration of intravenous contrast. Multiplanar CT image reconstructions and MIPs were obtained to evaluate the vascular anatomy. Carotid stenosis measurements (when applicable) are obtained utilizing NASCET criteria, using the distal internal carotid diameter as the denominator. CONTRAST:  60 mL Omnipaque 350. COMPARISON:  Plain head CT 2253 hours today. FINDINGS: CTA NECK Skeleton: Widespread advanced cervical spine degeneration. No acute osseous abnormality identified. Upper chest: Small to moderate layering left pleural effusion. Moderate to severe emphysema in the visible upper lungs. Patchy additional peripheral left upper lobe opacity. No superior mediastinal lymphadenopathy. Other neck: No acute findings. Aortic arch: Mild to moderate arch atherosclerosis. Three vessel arch configuration. Right carotid system: Brachiocephalic artery and right CCA origin within normal limits. Mild plaque proximal to the bifurcation without stenosis. Mild soft plaque at the medial right ICA origin without stenosis. Partially retropharyngeal course of the right ICA without stenosis to the skull base. Left carotid system: Mild plaque at the left CCA origin and proximal to the bifurcation without  stenosis. Negative bifurcation. Tortuous left ICA with a partially retropharyngeal course, no stenosis. Vertebral arteries: Proximal right subclavian artery plaque without stenosis. Normal right vertebral artery origin. Right vertebral artery is patent and within normal limits to the skull base. Mild to moderate proximal left subclavian artery plaque without significant stenosis. Normal left vertebral artery origin. Tortuous left V1 segment. Left vertebral artery is fairly codominant and patent to the skull base without stenosis. CTA HEAD Posterior circulation: Codominant distal vertebral arteries are patent to the vertebrobasilar junction without plaque or stenosis. The a ICAs may be dominant. Patent basilar artery without stenosis. Patent SCA and left PCA origins. However, the right PCA is occluded just beyond its origin (series 11, image 26) with poor reconstitution distally. The left PCA P2 and P3 segments are mildly irregular with mild stenosis. Anterior circulation: Both ICA siphons are patent. Mild calcified plaque on the left. Mild left supraclinoid ICA stenosis. Similar mild calcified plaque on the right and mild supraclinoid stenosis. Patent carotid termini. Posterior communicating arteries are diminutive or absent. Patent MCA and ACA origins. The left ACA is dominant and the right is diminutive with  subsequent azygos type ACA anatomy. No definite ACA occlusion. Left MCA M1 segment and trifurcation are patent without stenosis. Right MCA M1 segment and bifurcation are patent without stenosis. Bilateral MCA branches appear patent with mild irregularity. Venous sinuses: Early contrast timing, not evaluated. Anatomic variants: Dominant left ACA with azygos type ACA anatomy. Review of the MIP images confirms the above findings IMPRESSION: 1. Positive for occlusion of the Right PCA origin. 2. No other large vessel occlusion, and generally mild for age atherosclerosis in the head and neck. 3. Emphysema  (ICD10-J43.9) with layering left pleural effusion, patchy left upper lobe opacity compatible with pneumonia in this setting. 4. Aortic Atherosclerosis (ICD10-I70.0) Preliminary report of the above discussed by telephone with Dr. Lesleigh Noe on 11/13/2019 at 2314 hours. Electronically Signed   By: Genevie Ann M.D.   On: 11/13/2019 23:25   CT ANGIO PELVIS W OR WO CONTRAST  Result Date: 11/20/2019 CLINICAL DATA:  86 year old female with a history of mechanical thrombectomy for posterior circulation emergent large vessel occlusion 11/14/2019, with delayed hematoma and pseudoaneurysm of the access site. This was treated with thrombin injection with resolution 11/16/2019, now with apparent recurrent symptoms. EXAM: CTA PELVIS WITH AND WITHOUT CONTRAST TECHNIQUE: Spiral CT of the pelvis was performed before administration of IV contrast. Multidetector CTA imaging of the pelvis was then performed following the standard protocol following the bolus administration of intravenous contrast. Reformatted images in coronal and sagittal planes performed on a separate workstation. CONTRAST:  141m OMNIPAQUE IOHEXOL 350 MG/ML SOLN COMPARISON:  Ultrasound 11/16/2019 FINDINGS: VASCULAR: Mild atherosclerotic changes of the lower infrarenal abdominal aorta. There is incidental imaging of an accessory left renal artery to the lower pole segment from the lower abdominal aorta. Right lower extremity: Moderate atherosclerotic changes with irregular plaque of the right common iliac artery. Greatest diameter measures 15 mm. Hypogastric artery is patent. External iliac artery patent with minimal tortuosity. No high-grade stenosis or occlusion. No significant atherosclerotic changes of the common femoral artery. There is a rounded outpouching from the distal common femoral artery measuring 19 mm by 18 mm. Intermediate density on the pre contrast CT image, with relatively uniform filling after the contrast bolus, just less than the density of  the adjacent blood pool. A narrow neck to the common femoral artery is presumed with no obvious wide neck. Proximal profunda femoris and SFA patent. Left lower extremity: Mild irregular atherosclerotic plaque of the left common iliac artery, including a chronic appearing dissection flap, non flow limiting in the common iliac artery terminating above the bifurcation. Greatest diameter measures 13 mm. No inflammatory changes. Mild tortuosity. Hypogastric artery is patent. Mild tortuosity of the external iliac artery with minimal atherosclerosis. Common femoral artery patent with minimal atherosclerosis. Proximal profunda femoris and SFA patent. Venous: Unremarkable appearance of the venous system. NONVASCULAR: Unremarkable appearance of the visualized GI system. Urinary bladder partially distended. Changes of hysterectomy. Degenerative changes of the lower lumbar spine. Unremarkable bilateral hips. Hematoma of the proximal left thigh estimated 7.3 cm x 3.9 cm on axial images, 9.1 cm on coronal images. IMPRESSION: CT angiogram confirms patent right common femoral artery pseudoaneurysm with narrow neck, estimated 19 mm x 18 mm, and associated right thigh hematoma. Aortic atherosclerosis and atherosclerotic changes of the bilateral iliac arteries. Signed, JDulcy Fanny WDellia Nims RPVI [facility] Electronically Signed   By: JCorrie MckusickD.O.   On: 11/20/2019 14:28   MR ANGIO HEAD WO CONTRAST  Result Date: 11/14/2019 CLINICAL DATA:  Stroke follow-up. Right posterior cerebral artery thrombectomy 11/14/2019 EXAM: MRI HEAD WITHOUT CONTRAST MRA HEAD WITHOUT CONTRAST TECHNIQUE: Multiplanar, multiecho pulse sequences of the brain and surrounding structures were obtained without intravenous contrast. Angiographic images of the head were obtained using MRA technique without contrast. COMPARISON:  CT head 11/14/2019 FINDINGS: MRI HEAD FINDINGS Brain: Restricted diffusion  right thalamus compatible with acute infarct. Additional small areas of acute infarct in the right inferior and right superior cerebellum. Small area of acute infarct in the right frontal cortex over the convexity. Small amount of hemorrhage in the right thalamus and in the subarachnoid space in the interpeduncular cistern as noted on CT. Generalized atrophy. Ventricular enlargement is stable. Periventricular deep white matter hyperintensities bilaterally compatible with chronic ischemia. No mass lesion. Vascular: Normal arterial flow voids. Skull and upper cervical spine: No focal lesion. Sinuses/Orbits: Mucosal edema paranasal sinuses with air-fluid level right maxillary sinus. Bilateral cataract extraction Other: None MRA HEAD FINDINGS Both vertebral arteries patent to the basilar without stenosis. PICA not visualized. AICA patent bilaterally. Basilar widely patent. Superior cerebellar and posterior cerebral arteries patent bilaterally. Mild stenosis right P1/P2 junction with good distal flow in the right posterior cerebral artery post thrombectomy. Left PCA widely patent. Internal carotid artery widely patent bilaterally. Anterior and middle cerebral arteries widely patent without stenosis or large vessel occlusion. Negative for aneurysm. IMPRESSION: Acute infarct right thalamus with associated mild hemorrhage as noted on recent CT. There is also mild subarachnoid hemorrhage in the interpeduncular cistern unchanged from CT. Additional small areas of acute infarct in the right cerebellum and right frontal cortex. Moderate atrophy. Chronic microvascular ischemic change in the white matter. Right PCA patent post thrombectomy. There is a mild stenosis in the right PCA at the P1/ P2 junction. Electronically Signed   By: Franchot Gallo M.D.   On: 11/14/2019 08:26   MR BRAIN WO CONTRAST  Result Date: 11/14/2019 CLINICAL DATA:  Stroke follow-up. Right posterior cerebral artery thrombectomy 11/14/2019 EXAM: MRI HEAD  WITHOUT CONTRAST MRA HEAD WITHOUT CONTRAST TECHNIQUE: Multiplanar, multiecho pulse sequences of the brain and surrounding structures were obtained without intravenous contrast. Angiographic images of the head were obtained using MRA technique without contrast. COMPARISON:  CT head 11/14/2019 FINDINGS: MRI HEAD FINDINGS Brain: Restricted diffusion right thalamus compatible with acute infarct. Additional small areas of acute infarct in the right inferior and right superior cerebellum. Small area of acute infarct in the right frontal cortex over the convexity. Small amount of hemorrhage in the right thalamus and in the subarachnoid space in the interpeduncular cistern as noted on CT. Generalized atrophy. Ventricular enlargement is stable. Periventricular deep white matter hyperintensities bilaterally compatible with chronic ischemia. No mass lesion. Vascular: Normal arterial flow voids. Skull and upper cervical spine: No focal lesion. Sinuses/Orbits: Mucosal edema paranasal sinuses with air-fluid level right maxillary sinus. Bilateral cataract extraction Other: None MRA HEAD FINDINGS Both vertebral arteries patent to the basilar without stenosis. PICA not visualized. AICA patent bilaterally. Basilar widely patent. Superior cerebellar and posterior cerebral arteries patent bilaterally. Mild stenosis right P1/P2 junction with good distal flow in the right posterior cerebral artery post thrombectomy. Left PCA widely patent. Internal carotid artery widely patent bilaterally. Anterior and middle cerebral arteries widely patent without stenosis or large vessel occlusion. Negative for aneurysm. IMPRESSION: Acute infarct right thalamus with associated mild hemorrhage as noted on recent CT. There is also mild subarachnoid hemorrhage in the interpeduncular cistern unchanged from CT. Additional small areas of acute infarct in the right cerebellum and right  frontal cortex. Moderate atrophy. Chronic microvascular ischemic change in  the white matter. Right PCA patent post thrombectomy. There is a mild stenosis in the right PCA at the P1/ P2 junction. Electronically Signed   By: Franchot Gallo M.D.   On: 11/14/2019 08:26   US Guided Needle Placement  Result Date: 11/21/2019 CLINICAL DATA:  85 year old female with history of stroke status post catheter directed intervention with postprocedural right common femoral artery pseudoaneurysm. Status post percutaneous thrombin injection on 11/16/2019 with recurrent pseudoaneurysm patency noted on recent CT a from 11/20/2019. EXAM: Ultrasound guided puncture of pseudoaneurysm with thrombin injection MEDICATIONS: 200 units thrombin PROCEDURE: Informed written consent was obtained from the patient following discussion of risks, benefits, and alternatives to this procedure. Sterile preparation of the right groin was performed and sterile drapes were placed to expose the prior access site. Ultrasound demonstrates a 0.2 cm narrow neck pseudoaneurysm arising from the right common femoral artery. Color Doppler demonstrated to and fro, swirling flow within the pseudoaneurysm. Permanent ultrasound images were recorded. After appropriate subdermal analgesia with 1% lidocaine, ultrasound-guided puncture was made into the pseudoaneurysm using a 25 gauge needle with visualization of needle entry into the pseudoaneurysm. Under real-time ultrasound guidance, 200 units of thrombin were injected, for a total volume of 0.2 mL. Flow to the pseudoaneurysm was occluded. The native vessel remain patent. The needle was removed and a sterile dry dressing was placed. Peripheral pulses were unchanged. The patient tolerated the procedure well and was returned to the floor. IMPRESSION: Successful ultrasound-guided thrombin injection of a right common femoral artery pseudoaneurysm. PLAN: 1. 2 hours flat, 6 hours bed rest total. 2. Follow-up pseudoaneurysm duplex in approximately 24 hours. 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 FLUOROSCOPY TIME:  Ultrasound COMPLICATIONS: None PROCEDURE: Informed written consent was obtained from the patient after a thorough discussion of the procedural risks, benefits and alternatives. All questions were addressed. Maximal Sterile Barrier Technique was utilized including caps, mask, sterile gowns, sterile gloves, sterile drape, hand hygiene and skin antiseptic. A timeout was performed prior to the initiation of the procedure. Patient was positioned supine position on the ultrasound stretcher. Ultrasound images of the right inguinal region were performed with images stored sent to PACs. The patient was then prepped and draped in the usual sterile fashion. 1% lidocaine was used for local anesthesia. 5000 units of bovine thrombin was reconstituted in the proprietary package. A 22 gauge spinal needle was used to access the dome of the pseudoaneurysm with ultrasound guidance, at a site as far distal from the perceived neck as possible, on the inferior aspect. A 6 inch tubing was then attached to the needle after the stylet was removed with arterial blood return. A 6 inch tubing set was attached to a 3 way stopcock. 5 cc/5000 units of bovine reconstituted thrombin were drawn into a 5 cc syringe, attached to 1 of the side arm of the stopcock. The other side arm  excepted a 3 cc syringe. Under ultrasound observation, small aliquots  of the thrombin were injected using the 3 cc syringe. After approximately 2000 units were injected, we paused for observation. After we observed 2 minutes there was continuous flow in the dome. Upon attempted further injection the needle was occluded. This needle was removed and a new spinal needle was placed. Stylet was removed confirming arterial blood return. The short tubing set was reattached and additional 1500 units were slowly injected in small aliquots under ultrasound observation. Ultimately we achieved stagnant flow with thrombosis of the pseudoaneurysm. Needle was removed and final images were stored. We confirmed positive maintain Doppler signal in both the dorsalis pedis and posterior tibial at the right foot. Foot remains warm. No complications were encountered and no significant blood loss. Sterile dressing was placed. 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 Signed, Dulcy Fanny. Dellia Nims, RPVI [facility] Radiology Electronically Signed   By: Corrie Mckusick D.O.   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 FLUOROSCOPY TIME:  Ultrasound COMPLICATIONS: None PROCEDURE: Informed written consent was obtained from the patient after a thorough discussion of the procedural risks, benefits and  alternatives. All questions were addressed. Maximal Sterile Barrier Technique was utilized including caps, mask, sterile gowns, sterile gloves, sterile drape, hand hygiene and skin antiseptic. A timeout was performed prior to the initiation of the procedure. Patient was positioned supine position on the ultrasound stretcher. Ultrasound images of the right inguinal region were performed with images stored sent to PACs. The patient was then prepped and draped in the usual sterile fashion. 1% lidocaine was used for local anesthesia. 5000 units of bovine thrombin was reconstituted in the proprietary package. A 22 gauge spinal needle was used to access the dome of the pseudoaneurysm with ultrasound guidance, at a site as far distal from the perceived neck as possible, on the inferior aspect. A 6 inch tubing was then attached to the needle after the stylet was removed with arterial blood return. A 6 inch tubing set was attached to a 3 way stopcock. 5 cc/5000 units of bovine reconstituted thrombin were drawn into a 5 cc syringe, attached to 1 of the side arm of the stopcock. The other side arm excepted a 3 cc syringe. Under ultrasound observation, small aliquots of the thrombin were injected using the 3 cc syringe. After approximately 2000 units were injected, we paused for observation. After we observed 2 minutes there was continuous flow in the dome. Upon attempted further injection the needle was occluded. This needle was removed and a new spinal needle was placed. Stylet was removed confirming arterial blood return. The short tubing set was reattached and additional 1500 units were slowly injected in small aliquots under ultrasound observation. Ultimately we achieved stagnant flow with thrombosis of the pseudoaneurysm. Needle was removed and final images were stored. We confirmed positive maintain Doppler signal in both the dorsalis pedis and posterior tibial at the right foot. Foot remains warm. No complications were  encountered and no significant blood loss. Sterile dressing was placed. IMPRESSION: Status post ultrasound-guided needle placement and thrombin injection of right  common femoral artery pseudoaneurysm. Signed, Dulcy Fanny. Dellia Nims, RPVI [facility] Radiology Electronically Signed   By: Corrie Mckusick D.O.   On: 11/16/2019 15:39   IR CT Head Ltd  Result Date: 11/14/2019 INDICATION: 86 year old female presents for mechanical thrombectomy with emergent large vessel occlusion involving the right posterior cerebral artery EXAM: ULTRASOUND-GUIDED ACCESS RIGHT COMMON FEMORAL ARTERY CERVICAL AND CEREBRAL ANGIOGRAM MECHANICAL THROMBECTOMY RIGHT PCA ANGIO-SEAL FOR HEMOSTASIS COMPARISON:  CT imaging same day MEDICATIONS: None ANESTHESIA/SEDATION: The anesthesia team was present to provide general endotracheal tube anesthesia and for patient monitoring during the procedure. Intubation was performed in negative pressure Bay in neuro IR holding. Left radial arterial line was performed by the anesthesia team. Interventional neuro radiology nursing staff was also present. CONTRAST:  100 cc contrast FLUOROSCOPY TIME:  Fluoroscopy Time: 26 minutes 18 seconds (1961 mGy). COMPLICATIONS: SIR Level A - No therapy, no consequence. TECHNIQUE: Informed written consent was obtained from the patient's family after a thorough discussion of the procedural risks, benefits and alternatives. Specific risks discussed include: Bleeding, infection, contrast reaction, kidney injury/failure, need for further procedure/surgery, arterial injury or dissection, embolization to new territory, intracranial hemorrhage (10-15% risk), neurologic deterioration, cardiopulmonary collapse, death. All questions were addressed. Maximal Sterile Barrier Technique was utilized including during the procedure including caps, mask, sterile gowns, sterile gloves, sterile drape, hand hygiene and skin antiseptic. A timeout  was performed prior to the initiation of the procedure. The anesthesia team was present to provide general endotracheal tube anesthesia and for patient monitoring during the procedure. Interventional neuro radiology nursing staff was also present. FINDINGS: Initial Findings: Left subclavian artery: Normal course caliber and contour. Left internal mammary artery is patent. Costo cervical trunk and thyrocervical trunk are patent. Left vertebral artery: No significant atherosclerotic changes at the origin of the left vertebral artery. Tortuous course of the cervical vertebral artery without significant stenosis. Normal course caliber and contour of the intradural vertebral artery with no significant stenosis or atherosclerotic plaque. Basilar artery: Normal course caliber and contour. Patent right posteroinferior cerebellar artery. There appears to be a left-sided PICA/AICA variant Reflux of the distal right vertebral artery at the junction. Bilateral anterior inferior cerebellar arteries are patent. Bilateral superior cerebellar arteries are patent. Right PCA: Occluded just after the origin Left PCA: Normal course caliber and contour. Completion Findings: Right PCA: Complete restoration of flow after first pass, TICI 3. Baseline PCA flow: TICI 0 Final PCA flow: TICI 3 PROCEDURE: The anesthesia team was present to provide general endotracheal tube anesthesia and for patient monitoring during the procedure. Intubation was performed in negative pressure Bay in neuro IR holding. Interventional neuro radiology nursing staff was also present. Ultrasound survey of the right inguinal region was performed with images stored and sent to PACs. 11 blade scalpel was used to make a small incision. Blunt dissection was performed with US guidance. A micropuncture needle was used access the right common femoral artery under ultrasound. With excellent arterial blood flow returned, an .018 micro wire was passed through the needle,  observed to enter the abdominal aorta under fluoroscopy. The needle was removed, and a micropuncture sheath was placed over the wire. The inner dilator and wire were removed, and an 035 wire was advanced under fluoroscopy into the abdominal aorta. The sheath was removed and a 25cm 16F straight vascular sheath was placed. The dilator was removed and the sheath was flushed. Sheath was attached to pressurized and heparinized saline bag for constant forward flow. A coaxial  system was then advanced over the 035 wire. This included a 90cm neuron Max with coaxial 125cm Berenstein diagnostic catheter. This was advanced to the proximal descending thoracic aorta. Wire was then removed. Double flush of the catheter was performed. Catheter was then used to select the left subclavian artery. Angiogram was performed. Using roadmap technique, the catheter was advanced over a standard glide wire into left vertebral, with distal position achieved of the diagnostic catheter. The Neuron max catheter was then attempted to advance into the vertebral artery, meeting resistance just after the origin at the first tortuous turn. The Neuron Max was withdrawn just to the origin, and the diagnostic catheter and the glide wire were removed. Formal angiogram was performed. Road map function was used once the occluded vessel was identified. Copious back flush was performed and the balloon catheter was attached to heparinized and pressurized saline bag for forward flow. A second coaxial system was then advanced through the balloon catheter, which included the selected intermediate catheter, microcatheter, and microwire. In this scenario, the set up included a 135cm CAT 5 catheter, a 160cm 021 lumen microcatheter, and 014 synchro soft wire. This system was advanced through the NeuronMax catheter under the road-map function, with adequate back-flush at the rotating hemostatic valve at that back end of the balloon guide. Upon the initial navigation of  this second coaxial system into the proximal vertebral artery, the resistance of the system prolapsed the Neuron max into the aortic arch. This was not recoverable. Microcatheter intermediate catheter were removed and the 125 cm Berenstein catheter was advanced to the neuron max. We then selected the vertebral artery again with a proximal position of the neuron max. Berenstein catheter and the wire were removed. Angiogram was repeated. A second attempt was used then to navigate the CT 5 intermediate catheter and the microcatheter into the vertebral artery, which again prolapsed the base catheter. With the inability to get a more distal position given the tortuosity of the proximal vertebral artery segment, we withdrew the neuron max and abandoned this for a 6 French 95 cm benchmark catheter. The benchmark catheter was navigated with the mated diagnostic catheter into the left subclavian artery and the left vertebral artery. We were able to successfully navigate the combination into the mid segment of the cervical vertebral artery with the benchmark catheter achieving a more distal position. The Glidewire and the diagnostic catheter were withdrawn. Angiogram was performed. Microcatheter was then advanced into the basilar artery, and to the right p1 origin to the level of the occlusion. The synchro soft wire met resistance upon the initial passage through the occlusion, and so was withdrawn. We then used a 016 J shape headliner wire to navigate through the occlusion with the microcatheter following into the P3 segment of the PCA. Wire was gently removed with saline drip at the hub. Blood was then aspirated through the hub of the microcatheter, and a gentle contrast injection was performed confirming intraluminal position. A rotating hemostatic valve was then attached to the back end of the microcatheter, and a pressurized and heparinized saline bag was attached to the catheter. 3 x 20 solitaire device was then  selected. Back flush was achieved at the rotating hemostatic valve, and then the device was gently advanced through the microcatheter to the distal end. The retriever was then unsheathed by withdrawing the microcatheter under fluoroscopy. Control angiogram was performed from the intermediate catheter. A 3 minute time interval was observed. We then withdrew the microcatheter and the solitaire under  fluoroscopic observation, wall aspirating at the hub of the benchmark catheter under hand aspiration with 2 x 60 cc syringes on a 3 way stopcock. Once the solitaire was withdrawn to the Touhy the 2 was removed for the back end of the benchmark allowing back flow through the benchmark catheter. We aspirated at the hub and discarded the aspirate. Repeat angiogram was then performed. Restoration of flow was confirmed. Angiogram of the cervical vertebral artery was performed. The skin at the puncture site was then cleaned with Chlorhexidine. The 8 French sheath was removed and an 93F angioseal was deployed. Flat panel CT was performed. Given the findings of a questionable interpeduncular hemorrhage, the patient remained intubated. Patient tolerated the procedure well and remained hemodynamically stable throughout. Blood loss was estimated 50 cc IMPRESSION: Status post ultrasound guided access right common femoral artery for cervical and cerebral angiogram and mechanical thrombectomy of right posterior cerebral artery for treatment of posterior circulation ELVO, with TICI 3 flow at the conclusion. Signed, Dulcy Fanny. Dellia Nims, RPVI [facility] Inter[facility] Radiology Special[facility] Radiology PLAN: The patient will remain intubated, as small volume subarachnoid hemorrhage was confirmed with formal CT ICU status Target systolic blood pressure of 120-140 Right hip straight time 6 hours Frequent neurovascular checks Repeat neurologic imaging with CT and/MRI at the discretion of neurology team Electronically Signed   By:  Corrie Mckusick D.O.   On: 11/14/2019 02:58   IR US Guide Vasc Access Right  Result Date: 11/14/2019 INDICATION: 86 year old female presents for mechanical thrombectomy with emergent large vessel occlusion involving the right posterior cerebral artery EXAM: ULTRASOUND-GUIDED ACCESS RIGHT COMMON FEMORAL ARTERY CERVICAL AND CEREBRAL ANGIOGRAM MECHANICAL THROMBECTOMY RIGHT PCA ANGIO-SEAL FOR HEMOSTASIS COMPARISON:  CT imaging same day MEDICATIONS: None ANESTHESIA/SEDATION: The anesthesia team was present to provide general endotracheal tube anesthesia and for patient monitoring during the procedure. Intubation was performed in negative pressure Bay in neuro IR holding. Left radial arterial line was performed by the anesthesia team. Interventional neuro radiology nursing staff was also present. CONTRAST:  100 cc contrast FLUOROSCOPY TIME:  Fluoroscopy Time: 26 minutes 18 seconds (1961 mGy). COMPLICATIONS: SIR Level A - No therapy, no consequence. TECHNIQUE: Informed written consent was obtained from the patient's family after a thorough discussion of the procedural risks, benefits and alternatives. Specific risks discussed include: Bleeding, infection, contrast reaction, kidney injury/failure, need for further procedure/surgery, arterial injury or dissection, embolization to new territory, intracranial hemorrhage (10-15% risk), neurologic deterioration, cardiopulmonary collapse, death. All questions were addressed. Maximal Sterile Barrier Technique was utilized including during the procedure including caps, mask, sterile gowns, sterile gloves, sterile drape, hand hygiene and skin antiseptic. A timeout was performed prior to the initiation of the procedure. The anesthesia team was present to provide general endotracheal tube anesthesia and for patient monitoring during the procedure. Interventional neuro radiology nursing staff was also present. FINDINGS: Initial Findings: Left subclavian artery: Normal course caliber  and contour. Left internal mammary artery is patent. Costo cervical trunk and thyrocervical trunk are patent. Left vertebral artery: No significant atherosclerotic changes at the origin of the left vertebral artery. Tortuous course of the cervical vertebral artery without significant stenosis. Normal course caliber and contour of the intradural vertebral artery with no significant stenosis or atherosclerotic plaque. Basilar artery: Normal course caliber and contour. Patent right posteroinferior cerebellar artery. There appears to be a left-sided PICA/AICA variant Reflux of the distal right vertebral artery at the junction. Bilateral anterior inferior cerebellar arteries are patent. Bilateral superior cerebellar arteries are patent.  Right PCA: Occluded just after the origin Left PCA: Normal course caliber and contour. Completion Findings: Right PCA: Complete restoration of flow after first pass, TICI 3. Baseline PCA flow: TICI 0 Final PCA flow: TICI 3 PROCEDURE: The anesthesia team was present to provide general endotracheal tube anesthesia and for patient monitoring during the procedure. Intubation was performed in negative pressure Bay in neuro IR holding. Interventional neuro radiology nursing staff was also present. Ultrasound survey of the right inguinal region was performed with images stored and sent to PACs. 11 blade scalpel was used to make a small incision. Blunt dissection was performed with US guidance. A micropuncture needle was used access the right common femoral artery under ultrasound. With excellent arterial blood flow returned, an .018 micro wire was passed through the needle, observed to enter the abdominal aorta under fluoroscopy. The needle was removed, and a micropuncture sheath was placed over the wire. The inner dilator and wire were removed, and an 035 wire was advanced under fluoroscopy into the abdominal aorta. The sheath was removed and a 25cm 72F straight vascular sheath was placed. The  dilator was removed and the sheath was flushed. Sheath was attached to pressurized and heparinized saline bag for constant forward flow. A coaxial system was then advanced over the 035 wire. This included a 90cm neuron Max with coaxial 125cm Berenstein diagnostic catheter. This was advanced to the proximal descending thoracic aorta. Wire was then removed. Double flush of the catheter was performed. Catheter was then used to select the left subclavian artery. Angiogram was performed. Using roadmap technique, the catheter was advanced over a standard glide wire into left vertebral, with distal position achieved of the diagnostic catheter. The Neuron max catheter was then attempted to advance into the vertebral artery, meeting resistance just after the origin at the first tortuous turn. The Neuron Max was withdrawn just to the origin, and the diagnostic catheter and the glide wire were removed. Formal angiogram was performed. Road map function was used once the occluded vessel was identified. Copious back flush was performed and the balloon catheter was attached to heparinized and pressurized saline bag for forward flow. A second coaxial system was then advanced through the balloon catheter, which included the selected intermediate catheter, microcatheter, and microwire. In this scenario, the set up included a 135cm CAT 5 catheter, a 160cm 021 lumen microcatheter, and 014 synchro soft wire. This system was advanced through the NeuronMax catheter under the road-map function, with adequate back-flush at the rotating hemostatic valve at that back end of the balloon guide. Upon the initial navigation of this second coaxial system into the proximal vertebral artery, the resistance of the system prolapsed the Neuron max into the aortic arch. This was not recoverable. Microcatheter intermediate catheter were removed and the 125 cm Berenstein catheter was advanced to the neuron max. We then selected the vertebral artery again  with a proximal position of the neuron max. Berenstein catheter and the wire were removed. Angiogram was repeated. A second attempt was used then to navigate the CT 5 intermediate catheter and the microcatheter into the vertebral artery, which again prolapsed the base catheter. With the inability to get a more distal position given the tortuosity of the proximal vertebral artery segment, we withdrew the neuron max and abandoned this for a 6 French 95 cm benchmark catheter. The benchmark catheter was navigated with the mated diagnostic catheter into the left subclavian artery and the left vertebral artery. We were able to successfully navigate the combination  into the mid segment of the cervical vertebral artery with the benchmark catheter achieving a more distal position. The Glidewire and the diagnostic catheter were withdrawn. Angiogram was performed. Microcatheter was then advanced into the basilar artery, and to the right p1 origin to the level of the occlusion. The synchro soft wire met resistance upon the initial passage through the occlusion, and so was withdrawn. We then used a 016 J shape headliner wire to navigate through the occlusion with the microcatheter following into the P3 segment of the PCA. Wire was gently removed with saline drip at the hub. Blood was then aspirated through the hub of the microcatheter, and a gentle contrast injection was performed confirming intraluminal position. A rotating hemostatic valve was then attached to the back end of the microcatheter, and a pressurized and heparinized saline bag was attached to the catheter. 3 x 20 solitaire device was then selected. Back flush was achieved at the rotating hemostatic valve, and then the device was gently advanced through the microcatheter to the distal end. The retriever was then unsheathed by withdrawing the microcatheter under fluoroscopy. Control angiogram was performed from the intermediate catheter. A 3 minute time interval was  observed. We then withdrew the microcatheter and the solitaire under fluoroscopic observation, wall aspirating at the hub of the benchmark catheter under hand aspiration with 2 x 60 cc syringes on a 3 way stopcock. Once the solitaire was withdrawn to the Touhy the 2 was removed for the back end of the benchmark allowing back flow through the benchmark catheter. We aspirated at the hub and discarded the aspirate. Repeat angiogram was then performed. Restoration of flow was confirmed. Angiogram of the cervical vertebral artery was performed. The skin at the puncture site was then cleaned with Chlorhexidine. The 8 French sheath was removed and an 68F angioseal was deployed. Flat panel CT was performed. Given the findings of a questionable interpeduncular hemorrhage, the patient remained intubated. Patient tolerated the procedure well and remained hemodynamically stable throughout. Blood loss was estimated 50 cc IMPRESSION: Status post ultrasound guided access right common femoral artery for cervical and cerebral angiogram and mechanical thrombectomy of right posterior cerebral artery for treatment of posterior circulation ELVO, with TICI 3 flow at the conclusion. Signed, Dulcy Fanny. Dellia Nims, RPVI [facility] Radiology PLAN: The patient will remain intubated, as small volume subarachnoid hemorrhage was confirmed with formal CT ICU status Target systolic blood pressure of 120-140 Right hip straight time 6 hours Frequent neurovascular checks Repeat neurologic imaging with CT and/MRI at the discretion of neurology team Electronically Signed   By: Corrie Mckusick D.O.   On: 11/14/2019 02:58   DG CHEST PORT 1 VIEW  Result Date: 11/14/2019 CLINICAL DATA:  Endotracheal tube placement EXAM: PORTABLE CHEST 1 VIEW COMPARISON:  10/08/2010 FINDINGS: The endotracheal tube is been placed with tip measuring 4.7 cm above the carina. Cardiac enlargement. Small bilateral pleural  effusions. Peripheral interstitial changes with evidence of bronchiectasis, likely chronic fibrosis. Edema or pneumonia would be less likely considerations. No pneumothorax. Mediastinal contours appear intact. Calcification of the aorta. IMPRESSION: Endotracheal tube placed with tip measuring 4.7 cm above the carina. Cardiac enlargement with small bilateral pleural effusions. Probable chronic interstitial changes. Electronically Signed   By: Lucienne Capers M.D.   On: 11/14/2019 03:59   VAS [place] GROIN PSEUDOANEURYSM  Result Date: 11/22/2019  ARTERIAL PSEUDOANEURYSM  Exam: Right groin History: S/p thrombin injection 11/21/2019. Comparison Study: Prior study done 11/16/19, pseudoaneurysm found. Performing Technologist:  Sharion Dove RVS  Examination Guidelines: A complete evaluation includes B-mode imaging, spectral Doppler, color Doppler, and power Doppler as needed of all accessible portions of each vessel. Bilateral testing is considered an integral part of a complete examination. Limited examinations for reoccurring indications may be performed as noted.  Findings: A mixed echogenic structure measuring approximately 3.0 cm x 7.6 cm is visualized at the proximal thigh with ultrasound characteristics of a hematoma.  Summary: Pseudoaneurysm remains closed.  Diagnosing physician: Monica Martinez MD Electronically signed by Monica Martinez MD on 11/22/2019 at 3:01:48 PM.   --------------------------------------------------------------------------------    Final    VAS [place] GROIN PSEUDOANEURYSM  Result Date: 11/18/2019  ARTERIAL PSEUDOANEURYSM  Exam: follow up thrombin injection Comparison Study: previous 11/16/19 Performing Technologist: Abram Sander RVS  Examination Guidelines: A complete evaluation includes B-mode imaging, spectral Doppler, color Doppler, and power Doppler as needed of all accessible portions of each vessel. Bilateral testing is considered an integral part of a complete examination.  Limited examinations for reoccurring indications may be performed as noted. +------------+----------+---------+------+----------+ Right DuplexPSV (cm/s)Waveform PlaqueComment(s) +------------+----------+---------+------+----------+ CFA            104    triphasic                 +------------+----------+---------+------+----------+ Prox SFA       141    triphasic                 +------------+----------+---------+------+----------+  Summary: No evidence of pseudoaneurysm, AVF or DVT  Diagnosing physician: Harold Barban MD Electronically signed by Harold Barban MD on 11/18/2019 at 3:16:17 PM.   --------------------------------------------------------------------------------    Final    VAS [place] GROIN PSEUDOANEURYSM  Result Date: 11/16/2019  ARTERIAL PSEUDOANEURYSM  Exam: Right groin Indications: Patient complains of groin pain. History: S/p catheterization. Comparison Study: No prior studies. Performing Technologist: Darlin Coco, RDMS  Examination Guidelines: A complete evaluation includes B-mode imaging, spectral Doppler, color Doppler, and power Doppler as needed of all accessible portions of each vessel. Bilateral testing is considered an integral part of a complete examination. Limited examinations for reoccurring indications may be performed as noted. +------------+----------+---------+------+----------+ Right DuplexPSV (cm/s)Waveform PlaqueComment(s) +------------+----------+---------+------+----------+ CFA            130    triphasic                 +------------+----------+---------+------+----------+ PFA             63    biphasic                  +------------+----------+---------+------+----------+ Prox SFA        80    triphasic                 +------------+----------+---------+------+----------+ Right Vein comments:Patent with normal flow  Findings: An area with well defined borders measuring 2.0 cm x 2.4 cm was visualized arising off of the CFA distal with  ultrasound characteristics of a pseudoaneurysm. The neck measures approximately 0.3 cm wide and 0.9 cm long. A mixed echogenic structure is visualized at the medial to CFV and CFA with ultrasound characteristics of a hematoma.  Diagnosing physician: Servando Snare MD Electronically signed by Servando Snare MD on 11/16/2019 at 1:09:11 PM.   --------------------------------------------------------------------------------    Final    ECHOCARDIOGRAM COMPLETE  Result Date: 11/14/2019    ECHOCARDIOGRAM REPORT   Patient Name:   MARELLY WEHRMAN Date of Exam: 11/14/2019 Medical Rec #:  [id number]     Height:  61.0 in Accession #:    [id number]    Weight:       146.4 lb Date of Birth:  05/12/1931     BSA:          1.654 m Patient Age:    36 years      BP:           163/73 mmHg Patient Gender: F             HR:           78 bpm. Exam Location:  Inpatient Procedure: 2D Echo, Cardiac Doppler, Color Doppler and Intracardiac            Opacification Agent Indications:    Stroke  History:        Patient has prior history of Echocardiogram examinations, most                 recent 05/31/2012. Arrythmias:Atrial Flutter, Atrial Fibrillation                 and SVT, Signs/Symptoms:Chest Pain; Risk Factors:Former Smoker.  Sonographer:    Clayton Lefort RDCS (AE) Referring Phys: [id number] Lorenza Chick  Sonographer Comments: Suboptimal apical window. IMPRESSIONS  1. Left ventricular ejection fraction, by estimation, is 60 to 65%. The left ventricle has normal function. The left ventricle has no regional wall motion abnormalities. There is severe asymmetric left ventricular hypertrophy of the basal-septal segment. Left ventricular diastolic function could not be evaluated.  2. Right ventricular systolic function is normal. The right ventricular size is normal. There is mildly elevated pulmonary artery systolic pressure.  3. Left atrial size was moderately dilated.  4. A small pericardial effusion is present. The pericardial effusion is  circumferential.  5. The mitral valve is normal in structure. Trivial mitral valve regurgitation. No evidence of mitral stenosis. Moderate mitral annular calcification.  6. The aortic valve is tricuspid. There is mild calcification of the aortic valve. There is mild thickening of the aortic valve. Aortic valve regurgitation is trivial. Mild aortic valve sclerosis is present, with no evidence of aortic valve stenosis.  7. The inferior vena cava is normal in size with greater than 50% respiratory variability, suggesting right atrial pressure of 3 mmHg. Conclusion(s)/Recommendation(s): No intracardiac source of embolism detected on this transthoracic study. A transesophageal echocardiogram is recommended to exclude cardiac source of embolism if clinically indicated. Basal septal hypertrophy of LV that is severe, otherwise diffuse moderate LVH. No focal wall motion abnormalities. FINDINGS  Left Ventricle: Left ventricular ejection fraction, by estimation, is 60 to 65%. The left ventricle has normal function. The left ventricle has no regional wall motion abnormalities. Definity contrast agent was given IV to delineate the left ventricular  endocardial borders. The left ventricular internal cavity size was normal in size. There is severe asymmetric left ventricular hypertrophy of the basal-septal segment. Left ventricular diastolic function could not be evaluated due to atrial fibrillation. Left ventricular diastolic function could not be evaluated. Right Ventricle: The right ventricular size is normal. Right vetricular wall thickness was not well visualized. Right ventricular systolic function is normal. There is mildly elevated pulmonary artery systolic pressure. The tricuspid regurgitant velocity  is 2.93 m/s, and with an assumed right atrial pressure of 3 mmHg, the estimated right ventricular systolic pressure is 01.0 mmHg. Left Atrium: Left atrial size was moderately dilated. Right Atrium: Right atrial size was  normal in size. Pericardium: A small pericardial effusion is present. The pericardial effusion is circumferential. Mitral Valve:  The mitral valve is normal in structure. There is mild thickening of the mitral valve leaflet(s). There is mild calcification of the mitral valve leaflet(s). Moderate mitral annular calcification. Trivial mitral valve regurgitation. No evidence of mitral valve stenosis. Tricuspid Valve: The tricuspid valve is normal in structure. Tricuspid valve regurgitation is trivial. No evidence of tricuspid stenosis. Aortic Valve: The aortic valve is tricuspid. There is mild calcification of the aortic valve. There is mild thickening of the aortic valve. Aortic valve regurgitation is trivial. Mild aortic valve sclerosis is present, with no evidence of aortic valve stenosis. Aortic valve mean gradient measures 3.0 mmHg. Aortic valve peak gradient measures 6.2 mmHg. Aortic valve area, by VTI measures 2.69 cm. Pulmonic Valve: The pulmonic valve was grossly normal. Pulmonic valve regurgitation is not visualized. Aorta: The aortic root and ascending aorta are structurally normal, with no evidence of dilitation. Venous: The inferior vena cava is normal in size with greater than 50% respiratory variability, suggesting right atrial pressure of 3 mmHg. IAS/Shunts: The atrial septum is grossly normal. Additional Comments: There is a small pleural effusion in both left and right lateral regions.  LEFT VENTRICLE PLAX 2D LVIDd:         3.10 cm LVIDs:         2.30 cm LV PW:         1.40 cm LV IVS:        1.90 cm LVOT diam:     2.00 cm LV SV:         59 LV SV Index:   36 LVOT Area:     3.14 cm  RIGHT VENTRICLE            IVC RV Basal diam:  2.50 cm    IVC diam: 1.90 cm RV S prime:     7.94 cm/s TAPSE (M-mode): 2.0 cm LEFT ATRIUM             Index       RIGHT ATRIUM           Index LA diam:        3.90 cm 2.36 cm/m  RA Area:     13.80 cm LA Vol (A2C):   94.6 ml 57.19 ml/m RA Volume:   26.70 ml  16.14 ml/m LA  Vol (A4C):   54.4 ml 32.89 ml/m LA Biplane Vol: 72.7 ml 43.95 ml/m  AORTIC VALVE AV Area (Vmax):    2.60 cm AV Area (Vmean):   2.61 cm AV Area (VTI):     2.69 cm AV Vmax:           124.00 cm/s AV Vmean:          80.675 cm/s AV VTI:            0.220 m AV Peak Grad:      6.2 mmHg AV Mean Grad:      3.0 mmHg LVOT Vmax:         102.57 cm/s LVOT Vmean:        67.025 cm/s LVOT VTI:          0.189 m LVOT/AV VTI ratio: 0.86  AORTA Ao Root diam: 3.20 cm Ao Asc diam:  3.00 cm TRICUSPID VALVE TR Peak grad:   34.3 mmHg TR Vmax:        293.00 cm/s  SHUNTS Systemic VTI:  0.19 m Systemic Diam: 2.00 cm Buford Dresser MD Electronically signed by Buford Dresser MD Signature Date/Time: 11/14/2019/9:17:16 PM    Final  IR PERCUTANEOUS ART THROMBECTOMY/INFUSION INTRACRANIAL INC DIAG ANGIO  Result Date: 11/14/2019 INDICATION: 86 year old female presents for mechanical thrombectomy with emergent large vessel occlusion involving the right posterior cerebral artery EXAM: ULTRASOUND-GUIDED ACCESS RIGHT COMMON FEMORAL ARTERY CERVICAL AND CEREBRAL ANGIOGRAM MECHANICAL THROMBECTOMY RIGHT PCA ANGIO-SEAL FOR HEMOSTASIS COMPARISON:  CT imaging same day MEDICATIONS: None ANESTHESIA/SEDATION: The anesthesia team was present to provide general endotracheal tube anesthesia and for patient monitoring during the procedure. Intubation was performed in negative pressure [place] in neuro IR holding. Left radial arterial line was performed by the anesthesia team. Interventional neuro radiology nursing staff was also present. CONTRAST:  100 cc contrast FLUOROSCOPY TIME:  Fluoroscopy Time: 26 minutes 18 seconds (1961 mGy). COMPLICATIONS: SIR Level A - No therapy, no consequence. TECHNIQUE: Informed written consent was obtained from the patient's family after a thorough discussion of the procedural risks, benefits and alternatives. Specific risks discussed include: Bleeding, infection, contrast reaction, kidney injury/failure, need for  further procedure/surgery, arterial injury or dissection, embolization to new territory, intracranial hemorrhage (10-15% risk), neurologic deterioration, cardiopulmonary collapse, death. All questions were addressed. Maximal Sterile Barrier Technique was utilized including during the procedure including caps, mask, sterile gowns, sterile gloves, sterile drape, hand hygiene and skin antiseptic. A timeout was performed prior to the initiation of the procedure. The anesthesia team was present to provide general endotracheal tube anesthesia and for patient monitoring during the procedure. Interventional neuro radiology nursing staff was also present. FINDINGS: Initial Findings: Left subclavian artery: Normal course caliber and contour. Left internal mammary artery is patent. Costo cervical trunk and thyrocervical trunk are patent. Left vertebral artery: No significant atherosclerotic changes at the origin of the left vertebral artery. Tortuous course of the cervical vertebral artery without significant stenosis. Normal course caliber and contour of the intradural vertebral artery with no significant stenosis or atherosclerotic plaque. Basilar artery: Normal course caliber and contour. Patent right posteroinferior cerebellar artery. There appears to be a left-sided PICA/AICA variant Reflux of the distal right vertebral artery at the junction. Bilateral anterior inferior cerebellar arteries are patent. Bilateral superior cerebellar arteries are patent. Right PCA: Occluded just after the origin Left PCA: Normal course caliber and contour. Completion Findings: Right PCA: Complete restoration of flow after first pass, TICI 3. Baseline PCA flow: TICI 0 Final PCA flow: TICI 3 PROCEDURE: The anesthesia team was present to provide general endotracheal tube anesthesia and for patient monitoring during the procedure. Intubation was performed in negative pressure [place] in neuro IR holding. Interventional neuro radiology nursing staff  was also present. Ultrasound survey of the right inguinal region was performed with images stored and sent to PACs. 11 blade scalpel was used to make a small incision. Blunt dissection was performed with US guidance. A micropuncture needle was used access the right common femoral artery under ultrasound. With excellent arterial blood flow returned, an .018 micro wire was passed through the needle, observed to enter the abdominal aorta under fluoroscopy. The needle was removed, and a micropuncture sheath was placed over the wire. The inner dilator and wire were removed, and an 035 wire was advanced under fluoroscopy into the abdominal aorta. The sheath was removed and a 25cm 56F straight vascular sheath was placed. The dilator was removed and the sheath was flushed. Sheath was attached to pressurized and heparinized saline bag for constant forward flow. A coaxial system was then advanced over the 035 wire. This included a 90cm neuron Max with coaxial 125cm Berenstein diagnostic catheter. This was advanced to the proximal descending thoracic aorta.  Wire was then removed. Double flush of the catheter was performed. Catheter was then used to select the left subclavian artery. Angiogram was performed. Using roadmap technique, the catheter was advanced over a standard glide wire into left vertebral, with distal position achieved of the diagnostic catheter. The Neuron max catheter was then attempted to advance into the vertebral artery, meeting resistance just after the origin at the first tortuous turn. The Neuron Max was withdrawn just to the origin, and the diagnostic catheter and the glide wire were removed. Formal angiogram was performed. Road map function was used once the occluded vessel was identified. Copious back flush was performed and the balloon catheter was attached to heparinized and pressurized saline bag for forward flow. A second coaxial system was then advanced through the balloon catheter, which included  the selected intermediate catheter, microcatheter, and microwire. In this scenario, the set up included a 135cm CAT 5 catheter, a 160cm 021 lumen microcatheter, and 014 synchro soft wire. This system was advanced through the NeuronMax catheter under the road-map function, with adequate back-flush at the rotating hemostatic valve at that back end of the balloon guide. Upon the initial navigation of this second coaxial system into the proximal vertebral artery, the resistance of the system prolapsed the Neuron max into the aortic arch. This was not recoverable. Microcatheter intermediate catheter were removed and the 125 cm Berenstein catheter was advanced to the neuron max. We then selected the vertebral artery again with a proximal position of the neuron max. Berenstein catheter and the wire were removed. Angiogram was repeated. A second attempt was used then to navigate the CT 5 intermediate catheter and the microcatheter into the vertebral artery, which again prolapsed the base catheter. With the inability to get a more distal position given the tortuosity of the proximal vertebral artery segment, we withdrew the neuron max and abandoned this for a 6 French 95 cm benchmark catheter. The benchmark catheter was navigated with the mated diagnostic catheter into the left subclavian artery and the left vertebral artery. We were able to successfully navigate the combination into the mid segment of the cervical vertebral artery with the benchmark catheter achieving a more distal position. The Glidewire and the diagnostic catheter were withdrawn. Angiogram was performed. Microcatheter was then advanced into the basilar artery, and to the right p1 origin to the level of the occlusion. The synchro soft wire met resistance upon the initial passage through the occlusion, and so was withdrawn. We then used a 016 J shape headliner wire to navigate through the occlusion with the microcatheter following into the P3 segment of the  PCA. Wire was gently removed with saline drip at the hub. Blood was then aspirated through the hub of the microcatheter, and a gentle contrast injection was performed confirming intraluminal position. A rotating hemostatic valve was then attached to the back end of the microcatheter, and a pressurized and heparinized saline bag was attached to the catheter. 3 x 20 solitaire device was then selected. Back flush was achieved at the rotating hemostatic valve, and then the device was gently advanced through the microcatheter to the distal end. The retriever was then unsheathed by withdrawing the microcatheter under fluoroscopy. Control angiogram was performed from the intermediate catheter. A 3 minute time interval was observed. We then withdrew the microcatheter and the solitaire under fluoroscopic observation, wall aspirating at the hub of the benchmark catheter under hand aspiration with 2 x 60 cc syringes on a 3 way stopcock. Once the solitaire was  withdrawn to the Touhy the 2 was removed for the back end of the benchmark allowing back flow through the benchmark catheter. We aspirated at the hub and discarded the aspirate. Repeat angiogram was then performed. Restoration of flow was confirmed. Angiogram of the cervical vertebral artery was performed. The skin at the puncture site was then cleaned with Chlorhexidine. The 8 French sheath was removed and an 40F angioseal was deployed. Flat panel CT was performed. Given the findings of a questionable interpeduncular hemorrhage, the patient remained intubated. Patient tolerated the procedure well and remained hemodynamically stable throughout. Blood loss was estimated 50 cc IMPRESSION: Status post ultrasound guided access right common femoral artery for cervical and cerebral angiogram and mechanical thrombectomy of right posterior cerebral artery for treatment of posterior circulation ELVO, with TICI 3 flow at the conclusion. Signed, Dulcy Fanny. Dellia Nims, RPVI Vascular and  Interventional Radiology Specialists [facility] Radiology PLAN: The patient will remain intubated, as small volume subarachnoid hemorrhage was confirmed with formal CT ICU status Target systolic blood pressure of 120-140 Right hip straight time 6 hours Frequent neurovascular checks Repeat neurologic imaging with CT and/MRI at the discretion of neurology team Electronically Signed   By: Corrie Mckusick D.O.   On: 11/14/2019 02:58   CT HEAD CODE STROKE WO CONTRAST  Result Date: 11/13/2019 CLINICAL DATA:  Code stroke. 86 year old female with rightward gaze and left side weakness. EXAM: CT HEAD WITHOUT CONTRAST TECHNIQUE: Contiguous axial images were obtained from the base of the skull through the vertex without intravenous contrast. COMPARISON:  Head CT 02/04/2005. FINDINGS: Brain: No acute intracranial hemorrhage identified. No midline shift, mass effect, or evidence of intracranial mass lesion. Chronic cerebral volume loss. Ex vacuo appearing ventricular enlargement. Patchy bilateral cerebral white matter hypodensity. Age indeterminate patchy hypodensity in the thalami. Basal ganglia appear relatively spared. No cortically based acute infarct identified. No cortical encephalomalacia identified. Brainstem and cerebellum are within normal limits. Vascular: Calcified atherosclerosis at the skull base. No suspicious intracranial vascular hyperdensity. Skull: No acute osseous abnormality identified. Sinuses/Orbits: Mild new mastoid effusions. Paranasal sinuses remain well pneumatized. Other: No acute orbit or scalp soft tissue finding. Negative visible nasopharynx. ASPECTS [facility] Stroke Program Early CT Score) Total score (0-10 with 10 being normal): 10 IMPRESSION: 1. Age indeterminate small vessel disease including in the right thalamus, but no acute cortically based infarct or acute intracranial hemorrhage identified. ASPECTS 10. 2. These results were communicated to Dr. Curly Shores At 10:56 pm on 11/13/2019 by text  page via the [facility] messaging system. And also discussed by telephone at that same time. Electronically Signed   By: Genevie Ann M.D.   On: 11/13/2019 22:57       HISTORY OF PRESENT ILLNESS [name] is a 86 y.o. female with a PMHx of Afib/flutter s/p ablation, off AC due to persistent sinus rhythm, and a recent diagnosis of pneumonia, presenting with acute onset right gaze preference and left-sided weakness.   She was last seen well by her family at about 9:30 PM on 11/13/2019. She was watching television with her husband and when her son passed by at about 10 p.m. he noticed that she was not as responsive to him as she normally is. As he checked on her more he became concerned that she was unwell and activated EMS.    Regarding her pneumonia, her only symptoms were generalized fatigue none. She was not improving on the first round of antibiotics tried and therefore was broadened to Augmentin on Monday. She was  overall slowly improving. Family and patient deny other symptoms such as headache, vision changes, fevers, chills, sweats, shortness of breath. Premorbid modified rankin scale: 0   Initial NIH stroke scale was 11. IV tPA was given. Found to have a R PCA occlusion and sent to IR for mechanical thrombectomy.     HOSPITAL COURSE Ms. KAMILE FASSLER is a 86 y.o. female with history of Afib/flutter s/p ablation, off AC due to persistent sinus rhythm, and a recent diagnosis of pneumonia (she was not improving on the first round of antibiotics tried and therefore was broadened to Augmentin on Monday), presenting with acute onset right gaze preference and left-sided weakness. The patient received IV t-PA Tuesday 11/19 at 10:59 PM and sent to IR for R PCA occlusion.   Stroke: R thalamic, cerebellar, and frontal small infarcts with right PCA occlusion s/p tPA and IR with TICI3 from AF not on AC Small SAH interpeduncular cistern post IR CT Head - Age indeterminate small vessel disease including in the  right thalamus, but no acute cortically based infarct or acute intracranial hemorrhage identified.  CTA H&N - Positive for occlusion of the Right PCA origin. No other large vessel occlusion, and generally mild for age atherosclerosis in the head and neck.  Cerebral angio/ IR - Rt PCA occlusion with TICI 3 flow  CT head - Status post Right PCA endovascular revascularization with trace subarachnoid blood/contrast in the interpeduncular cistern. Small 10 mm area of contrast staining versus petechial hemorrhage in the medial right thalamus.  MRI head - R thalamus infarct with associated mild hemorrhage as noted on recent CT. There is also mild subarachnoid hemorrhage in the interpeduncular cistern unchanged from CT. Additional small areas of acute infarct in the right cerebellum and right frontal cortex. Moderate atrophy  MRA head -  Right PCA patent post thrombectomy. There is a mild stenosis in the right PCA at the P1/ P2 junction.  2D Echo - EF 60-65%. No source of embolus. LA moderately dilated [employer] Virus 2 - negative LDL - 26 HgbA1c - 5.7 VTE prophylaxis - Heparin 5000 units sq tid  aspirin 81 mg daily prior to admission, resumee Eliquis 10/28 Therapy recommendations:  HH OT, HH PT ->  CIR  Disposition:  CIR  Medically ready for d/c to CIR when bed available   R Groin Pseudoaneurysm w/ Hematoma Developed hospital D#2 at angio puncture site Groin pseudoaneurysm [place] 2.0 x 2.4 R CFA pseudoaneurysm w/ hematoma Thrombin injection 10/22 Earleen Newport) Repeat US 10/23 - no evidence of pseudoaneurysm  R groin tender this am after putting on  Eliquis 10/24 CTA pelvis 10/26 confirms R common femoral artery pseudoaneurysm with narrow neck, estimated 19 mm x 18 mm, and associated right thigh hematoma. VVS Scot Dock) following - for  surgical repair if repeat thrombin injection not successful repeat thrombin injection 10/27   Repeat US 10/28 - mixed echogenic structure proximal thigh c/w hematoma  Now  off bedrest, stable Back on Eliquis 10/28 Daily dry dressing changes. Compress if rebleeds and notify NIR on call.    Acute Respiratory Failure, resolved Intubated for IR, left intubated post IR  Sedated w/ fentanyl and propofol, now off Extubated 10/20 Patient stable CCM signed off   Atrial Fibrillation/Aflutter Home anticoagulation:  none  post ablation Dr. Ashley Mariner 2004. Doing very well. No further recurrences. Initially no AC given post tPA small SAH  Then off AC d/t recurrent R groin pseudoaneurysm Eliquis resumed 10/28 Follows with Dr. Marlou Porch [facility] Consider  resumption of metoprolol. Ok from stroke standpoint. BP and HR currently stable at time of d/c to CIR.   Community Acquired PNA No response to intial abx Broadened as OP to Augmention 10/18 Still has intermittent coughing CTA - layering left pleural effusion, patchy left upper lobe opacity compatible with pneumonia CXR 10/20 - small bilateral pleural effusions. Probable chronic interstitial changes. WBCs 8.1 (afebrile)   Hypertension Home BP meds: metoprolol  Treated short term w/ cleviprex, off  BP stable BP goal < 160  Long-term BP goal normotensive May need to resume metoprolol - monitor   Hyperlipidemia Home Lipid lowering medication: Lipitor 10 mg daily LDL 26, goal < 70 Will not resume lipitor due to low LDL level   Dysphagia, resolved Secondary to stroke Cleared for regular consistency, thin liquid diet Off IVF  Sore throat after extubation   Other Stroke Risk Factors Advanced age Former cigarette smoker - quit ETOH use, advised to drink no more than 1 alcoholic beverage per day.   Other Active Problems Emphysema (AJG81-L57.9)  Aortic Atherosclerosis (ICD10-I70.0)  Acute blood loss anemia - Hgb - 12.8->13.3-> 10.5->9.8->8.8->9.1->8.4->8.5->8.8 Hypokalemia - K 3.6 - resolved    DISCHARGE EXAM Blood pressure 127/82, pulse 87, temperature 98.2 F (36.8 C), temperature source Oral,  resp. rate 18, height _0  (1.549 m), weight 66.4 kg, SpO2 96 %. General - Well nourished, well developed, no acute distress.   Ophthalmologic - fundi not visualized due to noncooperation.   Cardiovascular - irregularly irregular heart rate and rhythm. Right groin shows tense hematoma which is tender to touch   Mental Status -  Level of arousal and orientation to place, age, month and person were intact, however, not oriented to year. Language exam showed no aphasia, able to name and repeat, follow simple commands.     Cranial Nerves II - XII - II - Visual field intact OU. III, IV, VI - Extraocular movements intact. V - Facial sensation intact bilaterally. VII - subtle left nasolabial fold flattening. VIII - Hearing & vestibular intact bilaterally. X - Palate elevates symmetrically. XI - Chin turning & shoulder shrug intact bilaterally. XII - Tongue protrusion intact.   Motor Strength - The patient's strength was normal in right upper and lower extremities, left upper extremity mildly decreased finger grip, left LE 4/5 proximal and 4+/5 distally.  Bulk was normal and fasciculations were absent. Right lower extremity movements slightly limited due to hip pain Motor Tone - Muscle tone was assessed at the neck and appendages and was normal.   Reflexes - The patient's reflexes were symmetrical in all extremities and she had no pathological reflexes.   Sensory - Light touch, temperature/pinprick were assessed and were symmetrical.     Coordination - The patient had normal movements in the hands with no ataxia or dysmetria.  Tremor was absent.   Gait and Station - able to stand with assistance off of the commode, hold on to walker, and pivot to sit in chair.  Discharge Diet  Heart healthy thin liquids  DISCHARGE PLAN Disposition:  Transfer to [place] for ongoing PT, OT and ST Eliquis (apixaban) daily for secondary stroke prevention  Monitor groin stability and HGB  on Eliquis - dry dressing change to groin daily, compress and call NIR if rebleeds Recommend ongoing stroke risk factor control by Primary Care Physician at time of discharge from inpatient rehabilitation. Follow-up PCP Crist Infante, MD in 2 weeks following discharge from rehab. Follow-up in [facility]  in 4 weeks following discharge from rehab, office to schedule an appointment.  Follow-up Corrie Mckusick, DO (Interventional Neuroradiologist) in 4 weeks following discharge from rehab, office to schedule an appointment.  Follow-up Dr, Marlou Porch in 4 weeks after rehab stay Consider resumption of metoprolol. Ok from stroke standpoint.   35 minutes were spent preparing discharge.  Burnetta Sabin, MSN, APRN, ANVP-BC, AGPCNP-BC Advanced Practice Stroke Nurse [place] for Schedule & Pager information 11/23/2019 12:57 PM  I have personally obtained history,examined this patient, reviewed notes, independently viewed imaging studies, participated in medical decision making and plan of care.ROS completed by me personally and pertinent positives fully documented  I have made any additions or clarifications directly to the above note. Agree with note above.    Antony Contras, MD Medical Director [facility] Pager: [phone] 11/23/2019 1:34 PM

## 2019-11-23 NOTE — Progress Notes (Signed)
Patient Details  Name: [name] MRN: [id number] Date of Birth: [birth year]  Today's Date: 11/23/2019  Hospital Problems: Principal Problem:   Acute ischemic right PCA stroke [facility]) s/p tPA and mechanical thrombectomy, d/t AF not on [facility]  Past Medical History:  Past Medical History:  Diagnosis Date  . Atrial fibrillation ([place])    Prior treatment with amiodarone, held sinus rhythm and drug stopped  . Atrial flutter ([place])    Status post ablation prior to  2009  . Chest pain    Nuclear, 2004, no scar or ischemia, EF 77%  . Ejection fraction    EF 70%, nuclear,  2004  //     . Fall    July, 2014  . H/O bladder repair surgery   . Right Achilles tendinitis   . Sciatica    Past Surgical History:  Past Surgical History:  Procedure Laterality Date  . IR CT HEAD LTD  11/14/2019  . IR PERCUTANEOUS ART THROMBECTOMY/INFUSION INTRACRANIAL INC DIAG ANGIO  11/14/2019  . IR US GUIDE VASC ACCESS RIGHT  11/14/2019  . no surgical hx    . RADIOLOGY WITH ANESTHESIA N/A 11/13/2019   Procedure: IR WITH ANESTHESIA;  Surgeon: Luanne Bras, MD;  Location: [facility];  Service: Radiology;  Laterality: N/A;   Social History:  reports that she has quit smoking. She has never used smokeless tobacco. She reports current alcohol use. She reports that she does not use drugs.  Family / Support Systems Marital Status: Married Patient Roles: Spouse, Parent Spouse/Significant Other: Herbie [place] (husband): [phone] Children: 4 adult children: Sheppard Coil (primary contact), Sidonie and Seth Bake Other Supports: None reported Anticipated Caregiver: Husband and children Ability/Limitations of Caregiver: None reported Caregiver Availability: 24/7 Family Dynamics: Pt lives with husband and has a very active lifestyle such as playign bridge once a week, walking at the park daily, and also in a book club.  Social History Preferred language: English Religion: Catholic Cultural Background: Pt was a  Designer, jewellery: high school grad Read: Yes Write: Yes Employment Status:  (N/A) Public relations account executive Issues: Denies Guardian/Conservator: N/A   Abuse/Neglect Abuse/Neglect Assessment Can Be Completed: Yes Physical Abuse: Denies Verbal Abuse: Denies Sexual Abuse: Denies Exploitation of patient/patient's resources: Denies Self-Neglect: Denies  Emotional Status Pt's affect, behavior and adjustment status: Pt in good spirits at time of visit. Pt son present during assessment. Recent Psychosocial Issues: SW interpretation mild situational depression Psychiatric History: Denies any hx Substance Abuse History: Admits to daily EtoH (1 glass of wine daily); quit smoking cigarettes 45 yrs ago  Patient / Family Perceptions, Expectations & Goals Pt/Family understanding of illness & functional limitations: Pt and pt family have general understanding of care needs Premorbid pt/family roles/activities: Independent Anticipated changes in roles/activities/participation: Assistance with ADLs/IADLs Pt/family expectations/goals: Pt goal is to : "get better, improve strnegth, and go to bathroom on own."  [employer]: None Premorbid Home Care/DME Agencies: None Transportation available at discharge: family Resource referrals recommended: Neuropsychology  Discharge Planning Living Arrangements: Spouse/significant other Support Systems: Spouse/significant other, Children Type of Residence: Private residence Insurance Resources: Medicare (No prescription durg coverage) Financial Resources: Family Support Financial Screen Referred: No Living Expenses: Own Money Management: Spouse Does the patient have any problems obtaining your medications?: No Home Management: Pt managed all housekeeping/meals at home. Children  helped with preparing meals for dinner as well Care Coordinator Anticipated Follow Up Needs: HH/OP  Clinical Impression This SW covering for  primary SW, Erlene Quan.   SW met with  pt and pt son Sherren Mocha in room to introduce self, explain role, and discuss discharge process. No HCPOA. Pt has living will. Would like to establish HCPOA. Pt husband is a English as a second language teacher but does not use any VA benefits. DME: cane. SW informed there will continue to be follow-up from primary SW upon return.   SW informed pt RN on chaplain services referral for [facility].  Ludmila Ebarb A Avice Funchess 11/23/2019, 5:24 PM

## 2019-11-23 NOTE — Progress Notes (Signed)
Kirsteins, Luanna Salk, MD  Physician  Physical Medicine and Rehabilitation  Consult Note     Signed  Date of Service:  11/19/2019 10:18 AM      Related encounter: ED to Hosp-Admission (Discharged) from 11/13/2019 in [facility]      Signed      Expand All Collapse All           Physical Medicine and Rehabilitation Consult     Reason for Consult: Stroke with functional deficits Referring Physician: Dr. Erlinda Hong     HPI: [name] is a 86 y.o. female with history of A fib/A flutter s/p ablation and off AC due to NSR, recent diagnosis of PNA who was admitted on 11/13/19 with left sided weakness and right gaze preference. CTA head negative and CTA head showed proximal R-PCA occlusion with emphysema and layering left pleural effusion c/w PNA. She underwent cerebral angio with revascularization of R-PCA by Dr. Earleen Newport. Post procedure noted to have right groin hematoma due to femoral CFA pseudoaneurysm. She underwent ultrasound guided thrombin injection of R-CFA with good results.  Once off bedrest, therapy evaluations done revealing right lateral lean with fatigue, shot shuffling steps, has difficulty with memory, cognition as well as safety awareness. CIR recommended due to functional decline.      Review of Systems  Constitutional: Negative for chills and fever.  HENT: Negative for hearing loss and tinnitus.   Eyes: Negative for blurred vision and double vision.  Respiratory: Positive for cough and shortness of breath.   Cardiovascular: Negative for chest pain and palpitations.  Gastrointestinal: Positive for constipation. Negative for heartburn and vomiting.  Genitourinary: Negative for dysuria and urgency.       Gets up at least once at nights. Wears poise pads.   Musculoskeletal: Positive for back pain.  Neurological: Positive for speech change and weakness. Negative for dizziness and headaches.          Past Medical History:  Diagnosis Date  . Atrial  fibrillation ([place])      Prior treatment with amiodarone, held sinus rhythm and drug stopped  . Atrial flutter ([place])      Status post ablation prior to  2009  . Chest pain      Nuclear, 2004, no scar or ischemia, EF 77%  . Ejection fraction      EF 70%, nuclear,  2004  //     . Fall      July, 2014  . H/O bladder repair surgery    . Right Achilles tendinitis    . Sciatica             Past Surgical History:  Procedure Laterality Date  . IR CT HEAD LTD   11/14/2019  . IR PERCUTANEOUS ART THROMBECTOMY/INFUSION INTRACRANIAL INC DIAG ANGIO   11/14/2019  . IR US GUIDE VASC ACCESS RIGHT   11/14/2019  . no surgical hx      . RADIOLOGY WITH ANESTHESIA N/A 11/13/2019    Procedure: IR WITH ANESTHESIA;  Surgeon: Luanne Bras, MD;  Location: [facility];  Service: Radiology;  Laterality: N/A;           Family History  Problem Relation Age of Onset  . Cancer Mother    . Cancer Father          pancreatic cancer  . Heart disease Brother        Social History:  Married. Husband just turned 95 and has memory issues--family providing 24 hrs supervision. She  reports that she has quit smoking at age 37. 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 Still plays bridge once a week.  She has never used smokeless tobacco. She reports current alcohol use--1-2 glasses of wine/day. She reports that she does not use drugs.          Allergies  Allergen Reactions  . Bee Venom Anaphylaxis  . Mixed Vespid Venom Anaphylaxis  . Codeine Other (See Comments)      Not known  . Sulfa Antibiotics Itching and Nausea Only  . Erythromycin Nausea And Vomiting            Medications Prior to Admission  Medication Sig Dispense Refill  . amoxicillin-clavulanate (AUGMENTIN) 875-125 MG tablet Take 1 tablet by mouth 2 (two) times daily.      Marland Kitchen aspirin 81 MG tablet Take 1 tablet (81 mg total) by mouth daily. 30 tablet 3  . atorvastatin (LIPITOR) 10 MG tablet Take 10 mg by mouth daily.      Marland Kitchen BIOTIN  PO Take 1 tablet by mouth daily.       Marland Kitchen CALCIUM PO Take 1 tablet by mouth daily.       . Cholecalciferol (VITAMIN D3) 50 MCG (2000 UT) CAPS Take 2,000 Units by mouth daily.      . Coenzyme Q10 (CO Q-10 PO) Take 1 tablet by mouth daily.       . metoprolol succinate (TOPROL-XL) 50 MG 24 hr tablet Take 50 mg by mouth daily. Take with or immediately following a meal.      . EPINEPHrine 0.3 mg/0.3 mL IJ SOAJ injection Inject 0.3 mg into the muscle as needed for anaphylaxis.       . Investigational - Study Medication Additional Study Details      . trimethoprim (TRIMPEX) 100 MG tablet Take 100 mg by mouth as directed. (Patient not taking: Reported on 11/14/2019)          Home: Home Living Family/patient expects to be discharged to:: Private residence Living Arrangements: Spouse/significant other Available Help at Discharge: Family, Available 24 hours/day Type of Home: House Home Access: Stairs to enter Technical brewer of Steps: 1 Entrance Stairs-Rails: Right Home Layout: One level Bathroom Shower/Tub: Tub only, Multimedia programmer: Standard Bathroom Accessibility: Yes Home Equipment: Radio producer - single point  Lives With: Spouse  Functional History: Prior Function Level of Independence: Independent Comments: reports independent and driving, managing her medication (takes care of her husbands meds too) Functional Status:  Mobility: Bed Mobility Overal bed mobility: Needs Assistance Bed Mobility: Supine to Sit Supine to sit: Mod assist General bed mobility comments: HOB elevated, most assistance required for scooting forward, left lateral lean Transfers Overall transfer level: Needs assistance Equipment used: Rolling walker (2 wheeled) Transfers: Sit to/from Stand, [employer] Transfers Sit to Stand: Mod assist, Min assist Stand pivot transfers: Mod assist General transfer comment: min-modA with use of RW, PT cues to encouraged forward lean, nose over toes, and for hand  placement Ambulation/Gait Ambulation/Gait assistance: Mod assist Gait Distance (Feet): 2 Feet Assistive device: Rolling walker (2 wheeled) Gait Pattern/deviations: Shuffle General Gait Details: pt with short shuffling steps, posterior and L lateral lean. PT cues to lean anteriorly onto RW and for step sequencing Gait velocity: reduced Gait velocity interpretation: <1.31 ft/sec, indicative of household ambulator   ADL: ADL Overall ADL's : Needs assistance/impaired Grooming: Minimal assistance, Sitting Upper Body Bathing: Minimal assistance, Sitting Lower Body Bathing: Minimal assistance, Sit to/from stand Upper Body  Dressing : Minimal assistance, Sitting Lower Body Dressing: Minimal assistance, Sit to/from stand Toilet Transfer Details (indicate cue type and reason): deferred, pt fatigued Functional mobility during ADLs: Minimal assistance, Rolling walker, Cueing for safety, Cueing for sequencing General ADL Comments: session focus on cognitive tasks related  to IADL tasks, administered the pillbox assesssment with pt failing assessment.   Cognition: Cognition Overall Cognitive Status: Impaired/Different from baseline Arousal/Alertness: Awake/alert Orientation Level: Oriented X4 Attention: Sustained Sustained Attention: Impaired Sustained Attention Impairment: Verbal basic, Functional basic Memory: Impaired Memory Impairment: Retrieval deficit, Decreased recall of new information, Decreased short term memory Decreased Short Term Memory: Verbal basic, Functional basic Immediate Memory Recall: Sock, Blue, Bed Memory Recall Sock: With Cue Memory Recall Blue: Not able to recall Memory Recall Bed: Without Cue Awareness: Impaired Awareness Impairment: Emergent impairment Problem Solving: Impaired Problem Solving Impairment: Verbal complex, Functional complex Executive Function: Writer: Impaired Organizing Impairment: Verbal basic, Functional basic Safety/Judgment:  Impaired Comments: Stated she could go home and "function close to normal" Cognition Arousal/Alertness: Awake/alert Behavior During Therapy: WFL for tasks assessed/performed Overall Cognitive Status: Impaired/Different from baseline Area of Impairment: Awareness, Problem solving, Safety/judgement Current Attention Level: Sustained Memory: Decreased short-term memory Following Commands: Follows multi-step commands inconsistently Safety/Judgement: Decreased awareness of deficits Awareness: Emergent Problem Solving: Slow processing, Difficulty sequencing General Comments: administered pill box assessment with pt taking > 69mins to complete task and finished tasks with > 3 errors. pt with no awareness to errors stating " well I do it different at home."     Blood pressure (!) 143/66, pulse 81, temperature 99.1 F (37.3 C), temperature source Oral, resp. rate 18, height 5\' 1"  (1.549 m), weight 66.4 kg, SpO2 93 %. Physical Exam Vitals and nursing note reviewed.  Constitutional:      Appearance: Normal appearance.  HENT:     Head: Normocephalic and atraumatic.  Eyes:     Extraocular Movements: Extraocular movements intact.     Conjunctiva/sclera: Conjunctivae normal.     Pupils: Pupils are equal, round, and reactive to light.  Cardiovascular:     Rate and Rhythm: Normal rate. Rhythm irregular.     Heart sounds: No murmur heard.   Pulmonary:     Effort: Pulmonary effort is normal. No respiratory distress.     Breath sounds: Normal breath sounds.  Abdominal:     General: Abdomen is flat. Bowel sounds are normal.     Palpations: Abdomen is soft.  Musculoskeletal:     Comments: RIght groin pain with Right hip flexion  Skin:    General: Skin is warm and dry.  Neurological:     Mental Status: She is alert and oriented to person, place, and time.     Cranial Nerves: No dysarthria.     Sensory: Sensation is intact.     Motor: No weakness or abnormal muscle tone.     Coordination:  Coordination abnormal.     Comments: Mild left facial weakness with mild hoarseness. Able to follow commands without difficulty.   Decreased finger to thumb opposition on Left side   Sensation intact to LT and temp bilaterally   Psychiatric:        Mood and Affect: Mood normal.        Behavior: Behavior normal.      Oriented x 3, remembers 3/3 objects immediate recall and after 3 min delay Lab Results Last 24 Hours       Results for orders placed or performed during the hospital encounter of 11/13/19 (from  the past 24 hour(s))  Basic metabolic panel     Status: Abnormal    Collection Time: 11/19/19  4:01 AM  Result Value Ref Range    Sodium 137 135 - 145 mmol/L    Potassium 3.4 (L) 3.5 - 5.1 mmol/L    Chloride 103 98 - 111 mmol/L    CO2 25 22 - 32 mmol/L    Glucose, Bld 108 (H) 70 - 99 mg/dL    BUN 8 8 - 23 mg/dL    Creatinine, Ser 0.79 0.44 - 1.00 mg/dL    Calcium 7.9 (L) 8.9 - 10.3 mg/dL    GFR, Estimated >60 >60 mL/min    Anion gap 9 5 - 15  CBC     Status: Abnormal    Collection Time: 11/19/19  4:01 AM  Result Value Ref Range    WBC 8.1 4.0 - 10.5 K/uL    RBC 2.70 (L) 3.87 - 5.11 MIL/uL    Hemoglobin 8.5 (L) 12.0 - 15.0 g/dL    HCT 26.6 (L) 36 - 46 %    MCV 98.5 80.0 - 100.0 fL    MCH 31.5 26.0 - 34.0 pg    MCHC 32.0 30.0 - 36.0 g/dL    RDW 12.6 11.5 - 15.5 %    Platelets 314 150 - 400 K/uL    nRBC 0.4 (H) 0.0 - 0.2 %      Imaging Results (Last 48 hours)  No results found.       Assessment/Plan: Diagnosis: RIght PCA infarct with balance and fine motor deficits 1. Does the need for close, 24 hr/day medical supervision in concert with the patient's rehab needs make it unreasonable for this patient to be served in a less intensive setting? Yes 2. Co-Morbidities requiring supervision/potential complications: Afib/flutter, RIght groin hematoma with pain during movement 3. Due to bladder management, bowel management, safety, skin/wound care, disease management,  medication administration, pain management and patient education, does the patient require 24 hr/day rehab nursing? Yes 4. Does the patient require coordinated care of a physician, rehab nurse, therapy disciplines of PT, OT to address physical and functional deficits in the context of the above medical diagnosis(es)? Yes Addressing deficits in the following areas: balance, endurance, locomotion, strength, transferring, bowel/bladder control, bathing, dressing, toileting and psychosocial support 5. Can the patient actively participate in an intensive therapy program of at least 3 hrs of therapy per day at least 5 days per week? Yes 6. The potential for patient to make measurable gains while on inpatient rehab is excellent 7. Anticipated functional outcomes upon discharge from inpatient rehab are modified independent  with PT, modified independent with OT, n/a with SLP. 8. Estimated rehab length of stay to reach the above functional goals is: 7d 9. Anticipated discharge destination: Home 10. Overall Rehab/Functional Prognosis: excellent   RECOMMENDATIONS: This patient's condition is appropriate for continued rehabilitative care in the following setting: CIR Patient has agreed to participate in recommended program. Yes Note that insurance prior authorization may be required for reimbursement for recommended care.   Comment:    Bary Leriche, PA-C 11/19/2019  "I have personally performed a face to face diagnostic evaluation of this patient.  Additionally, I have reviewed and concur with the physician assistant's documentation above." Charlett Blake M.D. [facility] FAAPM&R (Neuromuscular Med) Diplomate Am Board of Electrodiagnostic Med Fellow Am Board of Interventional Pain            Revision History  Routing History           Note Details  Author Charlett Blake, MD File Time 11/19/2019 12:23 PM  Author Type Physician Status Signed    Last Editor Charlett Blake, MD Service Physical Medicine and [place] # [id number] Admit Date 11/23/2019

## 2019-11-23 NOTE — Progress Notes (Signed)
Pt admitted to room 4W10. Oriented to floor, call bell and rehab fall policy. Denies pain or discomfort at this time. Currently resting in bed with all needs within reach.   Gerald Stabs, RN

## 2019-11-23 NOTE — TOC Transition Note (Signed)
Transition of Care [facility]) - CM/SW Discharge Note   Patient Details  Name: [name] MRN: [id number] Date of Birth: 1931-04-21  [facility] CM/SW Contact:  Pollie Friar, RN Phone Number: 11/23/2019, 11:32 AM   Clinical Narrative:    Pt is discharging to CIR today. CM signing off.   Final next level of care: IP Rehab Facility Barriers to Discharge: No Barriers Identified   Patient Goals and CMS Choice        Discharge Placement                       Discharge Plan and Services                                     Social Determinants of Health (SDOH) Interventions     Readmission Risk Interventions No flowsheet data found.

## 2019-11-23 NOTE — Progress Notes (Signed)
Physical Therapy Treatment Patient Details Name: [name] MRN: [id number] DOB: 02-02-1931 Today's Date: 11/23/2019    History of Present Illness 86 yo with acute right thalamic infarct s/p thrombectomy of Rt PCA 10/20 with revascularization, extubated 10/20. s/p image-guided right femoral thrombin injection on 10/27 to address R femoral pseudoaneurysm. PMHx:Afib, SVT    PT Comments    Pt eager to get out of bed and "get stronger" today. Pt ambulated to and from bathroom, requiring min assist for steadying and RW management. Pt demonstrating planning and attentional deficits today, stating "what now?" once pt standing in front of the bathroom after pt expressed need to have BM. Pt required pericare assist with toileting. BP 100/56(67) with activity, and pt reporting feeling "weak-headed". PT encouraged OOB time in recliner for BP regulation with positional changes. PT continuing to recommend CIR, will continue to follow while acute.     Follow Up Recommendations  CIR     Equipment Recommendations  Wheelchair (measurements PT);Wheelchair cushion (measurements PT);Rolling walker with 5" wheels;3in1 (PT) (defer to next venue)    Recommendations for Other Services       Precautions / Restrictions Precautions Precautions: Fall Precaution Comments: watch BP, dizziness    Mobility  Bed Mobility Overal bed mobility: Needs Assistance Bed Mobility: Supine to Sit     Supine to sit: Min assist;HOB elevated     General bed mobility comments: min assist for trunk elevation off of bed, scooting to EOB.  Transfers Overall transfer level: Needs assistance Equipment used: Rolling walker (2 wheeled) Transfers: Sit to/from Stand Sit to Stand: Mod assist         General transfer comment: mod assist for power up, correcting posterior postural bias. stand x3 during session, from EOB, toilet, and recliner.  Ambulation/Gait Ambulation/Gait assistance: Min assist Gait Distance (Feet):  10 Feet (x2) Assistive device: Rolling walker (2 wheeled) Gait Pattern/deviations: Step-through pattern;Decreased stride length;Shuffle;Trunk flexed     General Gait Details: min assist to steady, guide RW, frequent verbal/tactile cuing for positioning in RW   Stairs             Wheelchair Mobility    Modified Rankin (Stroke Patients Only)       Balance Overall balance assessment: Needs assistance Sitting-balance support: Bilateral upper extremity supported;Feet supported Sitting balance-Leahy Scale: Fair   Postural control: Posterior lean Standing balance support: Bilateral upper extremity supported Standing balance-Leahy Scale: Poor Standing balance comment: reliant on BUE support and external assist, posterior bias in standing today                            Cognition Arousal/Alertness: Awake/alert Behavior During Therapy: WFL for tasks assessed/performed Overall Cognitive Status: Impaired/Different from baseline Area of Impairment: Attention;Memory;Following commands;Safety/judgement;Problem solving                   Current Attention Level: Sustained Memory: Decreased short-term memory Following Commands: Follows one step commands with increased time Safety/Judgement: Decreased awareness of safety   Problem Solving: Slow processing;Decreased initiation;Difficulty sequencing;Requires verbal cues;Requires tactile cues General Comments: Pt requires step-by-step cuing for ambulation from bed to bathroom today, stating "okay, what do I do now?" when reaching doorframe of bathroom. Pleasant and motivated today.      Exercises General Exercises - Lower Extremity Hip Flexion/Marching: AROM;Both;5 reps;Standing    General Comments General comments (skin integrity, edema, etc.): BP 100/56(67), HR 60 bpm, pt reporting feeling "weak-headed"      Pertinent  Vitals/Pain Pain Assessment: Faces Faces Pain Scale: Hurts little more Pain Location: R  groin Pain Descriptors / Indicators: Sore;Discomfort Pain Intervention(s): Limited activity within patient's tolerance;Monitored during session;Repositioned    Home Living                      Prior Function            PT Goals (current goals can now be found in the care plan section) Acute Rehab PT Goals Patient Stated Goal: to improve strength and mobility PT Goal Formulation: With patient/family Time For Goal Achievement: 11/29/19 Potential to Achieve Goals: Good Progress towards PT goals: Progressing toward goals    Frequency    Min 4X/week      PT Plan Current plan remains appropriate    Co-evaluation              AM-PAC PT "6 Clicks" Mobility   Outcome Measure  Help needed turning from your back to your side while in a flat bed without using bedrails?: A Little Help needed moving from lying on your back to sitting on the side of a flat bed without using bedrails?: A Little Help needed moving to and from a bed to a chair (including a wheelchair)?: A Lot Help needed standing up from a chair using your arms (e.g., wheelchair or bedside chair)?: A Little Help needed to walk in hospital room?: A Lot Help needed climbing 3-5 steps with a railing? : Total 6 Click Score: 14    End of Session Equipment Utilized During Treatment: Gait belt Activity Tolerance: Patient tolerated treatment well;Patient limited by fatigue Patient left: in chair;with family/visitor present;with call bell/phone within reach;with chair alarm set Nurse Communication: Mobility status PT Visit Diagnosis: Difficulty in walking, not elsewhere classified (R26.2);Other abnormalities of gait and mobility (R26.89)     Time: 3335-4562 PT Time Calculation (min) (ACUTE ONLY): 24 min  Charges:  $Gait Training: 8-22 mins $Therapeutic Activity: 8-22 mins                     Dorothy Polhemus E, PT Acute Rehabilitation Services Pager [phone]  Office [phone]    Kaylean Tupou D Elonda Husky 11/23/2019, 12:23  PM

## 2019-11-23 NOTE — Progress Notes (Signed)
Jamse Arn, MD  Physician  Physical Medicine and Rehabilitation  PMR Pre-admission     Addendum  Date of Service:  11/19/2019  2:11 PM      Related encounter: ED to Hosp-Admission (Discharged) from 11/13/2019 in [facility]       PMR Admission Coordinator Pre-Admission Assessment   Patient: [name] is an 86 y.o., female MRN: [id number] DOB: 1931/03/06 Height: _0  (154.9 cm) Weight: 66.4 kg                                                                                                                                                  Insurance Information HMO:     PPO:      PCP:      IPA:      80/20:yes      OTHER:  PRIMARY: Medicare A & B      Policy#: [id number]      Subscriber: patient CM Name:       Phone#:      Fax#:  Pre-Cert#:       Employer:  Benefits:  Phone #: verified online via [employer] on 11/17/19     Name:  Eff. Date: Part A & B effective 01/25/1998     Deduct: $1,484      Out of Pocket Max: NA      Life Max: NA  CIR: 100% with Medicare approval      SNF: 100% days 1-20, 80% days 21-100 Outpatient: 80%    Co-Pay: 20% Home Health: 100%      Co-Pay:  DME: 80%     Co-Pay: 20% Providers: pt's choice SECONDARY:       Policy#:       Phone#:    Development worker, community:       Phone#:    The Actuary for patients in Inpatient Rehabilitation Facilities with attached Privacy Act [place] Records was provided and verbally reviewed with: Patient and Family   Emergency Contact Information Contact Information       Name Relation Home Work Mobile    [place] Spouse [phone]        Linsey, Arteaga     [phone]    Gordy Savers Daughter     [phone]    Rande Lawman Daughter     [phone]    Swanson,Sidonie Daughter     [phone]         Current Medical History  Patient Admitting Diagnosis: Right PCA infarct   History of Present Illness: Pt is a 86 y.o. female with history of  A fib/A flutter s/p ablation and off AC due to NSR, recent diagnosis of PNA who was admitted on 11/13/19 with left sided weakness and right gaze preference. CTA head negative and CTA head showed proximal R-PCA occlusion with emphysema and layering  left pleural effusion c/w PNA. She underwent cerebral angio with revascularization of R-PCA by Dr. Earleen Newport. Post procedure noted to have right groin hematoma due to femoral CFA pseudoaneurysm. She underwent ultrasound guided thrombin injection of R-CFA with good results.  Once off bedrest, therapy evaluations done revealing right lateral lean with fatigue, shot shuffling steps, has difficulty with memory, cognition as well as safety awareness. CIR recommended due to functional decline.    Complete NIHSS TOTAL: 2 Glasgow Coma Scale Score: 15   Past Medical History      Past Medical History:  Diagnosis Date   Atrial fibrillation ([place])      Prior treatment with amiodarone, held sinus rhythm and drug stopped   Atrial flutter ([place])      Status post ablation prior to  2009   Chest pain      Nuclear, 2004, no scar or ischemia, EF 77%   Ejection fraction      EF 70%, nuclear,  2004  //      Fall      July, 2014   H/O bladder repair surgery     Right Achilles tendinitis     Sciatica        Family History  family history includes Cancer in her father and mother; Heart disease in her brother.   Prior Rehab/Hospitalizations:  Has the patient had prior rehab or hospitalizations prior to admission? No   Has the patient had major surgery during 100 days prior to admission? Yes   Current Medications    Current Facility-Administered Medications:     stroke: mapping our early stages of recovery book, , Does not apply, Once, Bhagat, Srishti L, MD   acetaminophen (TYLENOL) tablet 650 mg, 650 mg, Oral, Q4H PRN **OR** acetaminophen (TYLENOL) 160 MG/5ML solution 650 mg, 650 mg, Per Tube, Q4H PRN **OR** acetaminophen (TYLENOL) suppository 650 mg, 650  mg, Rectal, Q4H PRN, Bhagat, Srishti L, MD   amoxicillin-clavulanate (AUGMENTIN) 875-125 MG per tablet 1 tablet, 1 tablet, Oral, Q12H, Bhagat, Srishti L, MD, 1 tablet at 11/20/19 0824   apixaban (ELIQUIS) tablet 5 mg, 5 mg, Oral, BID, Corynne, Scibilia, RPH, 5 mg at 11/20/19 0825   bisacodyl (DULCOLAX) suppository 10 mg, 10 mg, Rectal, Daily PRN, Rinehuls, David L, PA-C   chlorhexidine gluconate (MEDLINE KIT) (PERIDEX) 0.12 % solution 15 mL, 15 mL, Mouth Rinse, BID, Anders Simmonds, MD, 15 mL at 11/20/19 0826   docusate sodium (COLACE) capsule 100 mg, 100 mg, Oral, BID, Rosalin Hawking, MD, 100 mg at 11/20/19 0825   labetalol (NORMODYNE) injection 5-20 mg, 5-20 mg, Intravenous, Q2H PRN, Rosalin Hawking, MD, 5 mg at 11/19/19 0452   ondansetron (ZOFRAN) injection 4 mg, 4 mg, Intravenous, Q6H PRN, Rosalin Hawking, MD   pantoprazole (PROTONIX) EC tablet 40 mg, 40 mg, Oral, Daily, Rosalin Hawking, MD, 40 mg at 11/20/19 0824   polyethylene glycol (MIRALAX / GLYCOLAX) packet 17 g, 17 g, Oral, Daily, Hicks, Samantha B, NP, 17 g at 11/20/19 5009   senna-docusate (Senokot-S) tablet 2 tablet, 2 tablet, Oral, QHS PRN, Bhagat, Srishti L, MD, 2 tablet at 11/17/19 1028   Patients Current Diet:  Diet Order                  Diet Heart Room service appropriate? Yes with Assist; Fluid consistency: Thin  Diet effective 1000                         Precautions / Restrictions  Precautions Precautions: Fall Precaution Comments: watch BP, dizziness Restrictions Weight Bearing Restrictions: No    Has the patient had 2 or more falls or a fall with injury in the past year?No   Prior Activity Level Community (5-7x/wk): drives; leaves house everyday   Prior Functional Level Prior Function Level of Independence: Independent Comments: reports independent and driving, managing her medication (takes care of her husbands meds too)   Self Care: Did the patient need help bathing, dressing, using the toilet or  eating?  Independent   Indoor Mobility: Did the patient need assistance with walking from room to room (with or without device)? Independent   Stairs: Did the patient need assistance with internal or external stairs (with or without device)? Needed some help   Functional Cognition: Did the patient need help planning regular tasks such as shopping or remembering to take medications? Independent   Home Assistive Devices / Equipment Home Equipment: Cane - single point   Prior Device Use: Indicate devices/aids used by the patient prior to current illness, exacerbation or injury?  cane   Current Functional Level Cognition   Arousal/Alertness: Awake/alert Overall Cognitive Status: Impaired/Different from baseline Current Attention Level: Sustained Orientation Level: Oriented X4 Following Commands: Follows one step commands with increased time Safety/Judgement: Decreased awareness of deficits, Decreased awareness of safety General Comments: pt AxOx4 but at end of session states " I can't believe this my bedroom." Attention: Sustained Sustained Attention: Impaired Sustained Attention Impairment: Verbal basic, Functional basic Memory: Impaired Memory Impairment: Retrieval deficit, Decreased recall of new information, Decreased short term memory Decreased Short Term Memory: Verbal basic, Functional basic Awareness: Impaired Awareness Impairment: Emergent impairment Problem Solving: Impaired Problem Solving Impairment: Verbal complex, Functional complex Executive Function: Writer: Impaired Organizing Impairment: Verbal basic, Functional basic Safety/Judgment: Impaired Comments: Stated she could go home and "function close to normal"    Extremity Assessment (includes Sensation/Coordination)   Upper Extremity Assessment: LUE deficits/detail LUE Deficits / Details: decreased coordination, edema in UE  LUE Sensation: decreased light touch LUE Coordination: decreased fine  motor, decreased gross motor  Lower Extremity Assessment: Defer to PT evaluation     ADLs   Overall ADL's : Needs assistance/impaired Grooming: Wash/dry face, Oral care, Brushing hair, Sitting, Set up Grooming Details (indicate cue type and reason): from recliner, pt able to sequence all ADLs tasks with no cues needed Upper Body Bathing: Minimal assistance, Sitting Lower Body Bathing: Minimal assistance, Sit to/from stand Upper Body Dressing : Minimal assistance, Sitting Lower Body Dressing: Minimal assistance, Sit to/from stand Toilet Transfer: Minimal assistance, Moderate assistance, RW, Stand-pivot Toilet Transfer Details (indicate cue type and reason): simulated via functional mobility, MIN A for stand pivot transfer with RW, up to MOD A for balance d/t initial posterior lean in standing Functional mobility during ADLs: Minimal assistance, Rolling walker, Moderate assistance (stand pivot only) General ADL Comments: pt continues to present with decreased activity tolerance, generalized weakness and impaired balance impacting pts ability to complete BADLs     Mobility   Overal bed mobility: Needs Assistance Bed Mobility: Supine to Sit Supine to sit: Min assist, HOB elevated General bed mobility comments: MIN to elevate trunk into sitting and scoot hips to EOB using bed pad     Transfers   Overall transfer level: Needs assistance Equipment used: Rolling walker (2 wheeled) Transfers: Sit to/from Stand, [employer] Transfers Sit to Stand: Min assist, Mod assist Stand pivot transfers: Min assist General transfer comment: pt able to power up from EOB with MIN  A but required up to MOD A for posterior LOB. pt able to pivot to recliner to pts R side with MIIN A needing increased time for c/o dizziness     Ambulation / Gait / Stairs / Wheelchair Mobility   Ambulation/Gait Ambulation/Gait assistance: Min assist, Mod assist Gait Distance (Feet): 10 Feet Assistive device: Rolling walker (2  wheeled) Gait Pattern/deviations: Step-through pattern, Decreased stride length, Shuffle, Trunk flexed General Gait Details: min-mod assist for steadying, physically maneuvering RW, multimodal cuing for placement in RW, upright posture, taking larger steps . Gait velocity: decr Gait velocity interpretation: <1.31 ft/sec, indicative of household ambulator     Posture / Balance Dynamic Sitting Balance Sitting balance - Comments: required at least one UE support with static sitting Balance Overall balance assessment: Needs assistance Sitting-balance support: Single extremity supported, Feet supported Sitting balance-Leahy Scale: Poor Sitting balance - Comments: required at least one UE support with static sitting Postural control: Left lateral lean Standing balance support: Bilateral upper extremity supported Standing balance-Leahy Scale: Poor Standing balance comment: reliant on BUE support and external assist     Special needs/care consideration Skin ecchymosis: arm, groin/right; abrasion: right arm; surgical incision: groin, right/anterior, External urinary catheter and Designated visitor Rande Lawman, daughter; Kateri Mc, son; Kathryne Hitch, daughter; Gordy Savers, daughter        Previous Home Environment (from acute therapy documentation) Living Arrangements: Spouse/significant other  Lives With: Spouse Available Help at Discharge: Family, Available 24 hours/day Type of Home: House Home Layout: One level Home Access: Stairs to enter Entrance Stairs-Rails: Right Entrance Stairs-Number of Steps: 1 Bathroom Shower/Tub: Tub only, Multimedia programmer: Standard Bathroom Accessibility: Yes How Accessible: Accessible via walker [place]: No   Discharge Living Setting Plans for Discharge Living Setting: Patient's home Type of Home at Discharge: House Discharge Home Layout: One level Discharge Home Access: Stairs to enter Entrance Stairs-Rails:  Right Entrance Stairs-Number of Steps: 1 Discharge Bathroom Shower/Tub: Tub only, Walk-in shower Discharge Bathroom Toilet: Standard Discharge [place] Accessibility: Yes How Accessible: Accessible via walker Does the patient have any problems obtaining your medications?: No   Social/Family/Support Systems Anticipated Caregiver: Sinclair Ship Anticipated Caregiver's Contact Information: Sherren Mocha: [phone], [name]: [phone], Sidonie: [phone],Andrea: [phone] Caregiver Availability: 24/7 Discharge Plan Discussed with Primary Caregiver: Yes Is Caregiver In Agreement with Plan?: Yes Does Caregiver/Family have Issues with Lodging/Transportation while Pt is in Rehab?: No     Goals Patient/Family Goal for Rehab: PT/OT Supervision goals, No SLP goals Expected length of stay: 10-14 days. Cultural Considerations: None Pt/Family Agrees to Admission and willing to participate: Yes Program Orientation Provided & Reviewed with Pt/Caregiver Including Roles  & Responsibilities: Yes     Decrease burden of Care through IP rehab admission: NA     Possible need for SNF placement upon discharge: NA     Patient Condition: This patient's medical and functional status has changed since the consult dated: 11/19/19 in which the Rehabilitation Physician determined and documented that the patient's condition is appropriate for intensive rehabilitative care in an inpatient rehabilitation facility. See "History of Present Illness" (above) for medical update. Functional changes are: decline in functional ambulation status and transfer status due to recent procedure and bedrest orders (decline from Min/Mod A for transfers and Mod A for ambulation to Mod A +2 for transfers and an inability to tolerate gait). Patient's medical and functional status update has been discussed with the Rehabilitation physician and patient remains appropriate for inpatient rehabilitation. Will admit to  inpatient rehab  today.   Preadmission Screen Completed By:  Retta Diones, RN, 11/20/2019 11:43 AM ______________________________________________________________________   Discussed status with Dr. Posey Pronto on 11/23/19 at 10:46AM and received approval for admission today.   Admission Coordinator:  Retta Diones, with day of admit updates completed by Raechel Ache, OTR/L at time 10:46AM/Date 11/23/19.         Revision History                                         Note Details  Author Jamse Arn, MD File Time 11/23/2019 10:58 AM  Author Type Physician Status Addendum  Last Editor Jamse Arn, MD Service Physical Medicine and [place] # [id number] [place] Date 11/23/2019

## 2019-11-23 NOTE — Progress Notes (Signed)
STROKE TEAM PROGRESS NOTE   INTERVAL HISTORY Son at bedside. Hope for d.c to CIR today. Rt groin hematoma still tender but able to ambulate with assistance.  Rpt groin ultrasound y`day showed closed pseudoaneurysm. Neuro exam is stable and unchanged. Vital signs stable. OBJECTIVE Vitals:   11/22/19 2000 11/23/19 0000 11/23/19 0400 11/23/19 0802  BP: 127/71 126/70 (!) 145/61 134/68  Pulse: 74 73 74 72  Resp: 18 17 16 17   Temp: 98.Lynch F (36.8 C) 98.9 F (37.Lynch C) 98.7 F (37.1 C) 98.5 F (36.9 C)  TempSrc: Oral Oral Oral Oral  SpO2: 97% 100% 98% 95%  Weight:      Height:       CBC:  Recent Labs  Lab 11/19/19 0401 11/21/19 0411  WBC 8.1 8.Lynch  HGB 8.5* 8.8*  HCT 26.6* 27.5*  MCV 98.5 98.6  PLT 314 659   Basic Metabolic Panel:  Recent Labs  Lab 11/19/19 0401 11/21/19 0411  NA 137 137  K 3.4* 3.6  CL 103 104  CO2 25 20*  GLUCOSE 108* 107*  BUN 8 7*  CREATININE 0.79 0.65  CALCIUM 7.9* 8.Lynch*    IMAGING No results found.   PHYSICAL EXAM         Temp:  [98.Lynch F (36.8 C)-98.9 F (37.Lynch C)] 98.5 F (36.9 C) (10/29 0802) Pulse Rate:  [67-82] 72 (10/29 0802) Resp:  [16-18] 17 (10/29 0802) BP: (113-145)/(58-71) 134/68 (10/29 0802) SpO2:  [95 %-100 %] 95 % (10/29 0802)  General - Well nourished, well developed, no acute distress.  Ophthalmologic - fundi not visualized due to noncooperation.  Cardiovascular - irregularly irregular heart rate and rhythm. Right groin shows tense hematoma which is tender to touch  Mental Status -  Level of arousal and orientation to place, age, month and person were intact, however, not oriented to year. Language exam showed no aphasia, able to name and repeat, follow simple commands.    Cranial Nerves II - XII - II - Visual field intact OU. III, IV, VI - Extraocular movements intact. V - Facial sensation intact bilaterally. VII - subtle left nasolabial fold flattening. VIII - Hearing & vestibular intact bilaterally. X - Palate  elevates symmetrically. XI - Chin turning & shoulder shrug intact bilaterally. XII - Tongue protrusion intact.  Motor Strength - The patients strength was normal in right upper and lower extremities, left upper extremity mildly decreased finger grip, left LE 4/5 proximal and 4+/5 distally.  Bulk was normal and fasciculations were absent. Right lower extremity movements slightly limited due to hip pain Motor Tone - Muscle tone was assessed at the neck and appendages and was normal.  Reflexes - The patients reflexes were symmetrical in all extremities and she had no pathological reflexes.  Sensory - Light touch, temperature/pinprick were assessed and were symmetrical.    Coordination - The patient had normal movements in the hands with no ataxia or dysmetria.  Tremor was absent.  Gait and Station - able to stand with assistance off of the commode, hold on to walker, and pivot to sit in chair.   ASSESSMENT/PLAN [name] is a 86 y.o. female with history of Afib/flutter s/p ablation, off AC due to persistent sinus rhythm, and a recent diagnosis of pneumonia (she was not improving on the first round of antibiotics tried and therefore was broadened to Augmentin on Monday), presenting with acute onset right gaze preference and left-sided weakness. The patient received IV t-PA Tuesday 11/19 at 10:59 PM and  sent to IR for R PCA occlusion.  Stroke: R thalamic, cerebellar, and frontal small infarcts with right PCA occlusion s/p tPA and IR with TICI3 from AF not on AC Small SAH interpeduncular cistern post IR  CT Head - Age indeterminate small vessel disease including in the right thalamus, but no acute cortically based infarct or acute intracranial hemorrhage identified.   CTA H&N - Positive for occlusion of the Right PCA origin. No other large vessel occlusion, and generally mild for age atherosclerosis in the head and neck.   Cerebral angio/ IR - Rt PCA occlusion with TICI 3 flow   CT  head - Status post Right PCA endovascular revascularization with trace subarachnoid blood/contrast in the interpeduncular cistern. Small 10 mm area of contrast staining versus petechial hemorrhage in the medial right thalamus.   MRI head - R thalamus infarct with associated mild hemorrhage as noted on recent CT. There is also mild subarachnoid hemorrhage in the interpeduncular cistern unchanged from CT. Additional small areas of acute infarct in the right cerebellum and right frontal cortex. Moderate atrophy   MRA head -  Right PCA patent post thrombectomy. There is a mild stenosis in the right PCA at the P1/ P2 junction.   2D Echo - EF 60-65%. No source of embolus. LA moderately dilated  [name] - negative  LDL - 26  HgbA1c - 5.7  VTE prophylaxis - Heparin 5000 units sq tid   aspirin 81 mg daily prior to admission, resume Eliquis  Therapy recommendations:  HH OT, HH PT ->  CIR   Disposition:  CIR   Medically ready for d/c to CIR when bed available  R Groin Hematoma  Developed hospital D#Lynch at angio puncture site  Groin pseudoaneurysm [place] Lynch.0 x Lynch.4 R CFA pseudoaneurysm w/ hematoma  Thrombin injection 10/22 [name])  Repeat US 10/23 - no evidence of pseudoaneurysm   R groin tender this am after putting on  Eliquis 10/24  CTA pelvis 10/26 confirms R common femoral artery pseudoaneurysm with narrow neck, estimated 19 mm x 18 mm, and associated right thigh hematoma.  VVS [name]) following - for  surgical repair if repeat thrombin injection not successful  repeat thrombin injection 10/27    Repeat US 10/28 - mixed echogenic structure proximal thigh c/w hematoma   Now off bedrest, stable  Back on Eliquis  Acute Respiratory Failure, resolved  Intubated for IR, left intubated post IR   Sedated w/ fentanyl and propofol, now off  Extubated 10/20  Patient stable  CCM signed off   Atrial Fibrillation/Aflutter  Home anticoagulation:  none   post ablation  Dr. Ashley Lynch 2004. Doing very well. No further recurrences.  Initially no AC given post tPA small SAH   Now off AC d/t recurrent R groin pseudoaneurysm  Follows with Dr. Marlou Lynch at [facility] Acquired PNA  No response to intial abx  Broadened as OP to Augmention 10/18  Still has intermittent coughing  CTA - layering left pleural effusion, patchy left upper lobe opacity compatible with pneumonia  CXR 10/20 - small bilateral pleural effusions. Probable chronic interstitial changes.  WBCs 8.1 (afebrile)  Hypertension  Home BP meds: metoprolol   Treated short term w/ cleviprex, off   BP stable  BP goal < 160   Long-term BP goal normotensive  Hyperlipidemia  Home Lipid lowering medication: Lipitor 10 mg daily  LDL 26, goal < 70  Will not resume lipitor due to low LDL level  Dysphagia,  resolved  Secondary to stroke  Cleared for regular consistency, thin liquid diet  Off IVF   Sore throat after extubation   Other Stroke Risk Factors  Advanced age  Former cigarette smoker - quit  ETOH use, advised to drink no more than 1 alcoholic beverage per day.  Other Active Problems  Emphysema (OIB70-W88.9)   Aortic Atherosclerosis (ICD10-I70.0)   Anemia - Hgb - 12.8->13.3-> 10.5->9.8->8.8->9.1->8.4->8.5->8.8  Hypokalemia - K 3.6 - resolved  Hospital day # 10 Continue ongoing management.Transfer to rehab when bed available.   Antony Contras, MD To contact Stroke Continuity provider, please refer to [email or web address] After hours, contact General Neurology

## 2019-11-23 NOTE — Progress Notes (Signed)
Inpatient Rehabilitation-Admissions Coordinator   Received approval from attending service for discharge to CIR today. Pt notified of bed offer and she has accepted. RN and [facility] team notified of plan for today. AC has reviewed all paperwork with pt and her son. All questions answered.   Please call if questions.   Raechel Ache, OTR/L  Rehab Admissions Coordinator  [phone] 11/23/2019 10:53 AM

## 2019-11-23 NOTE — Progress Notes (Signed)
Discharge instructions were reviewed with pt and son; voiced understanding. Pt's personal belongings were packed by staff and pt's son and transported with pt. Reported was given to RN. Pt. transported via wheelchair to 4W10.

## 2019-11-23 NOTE — H&P (Signed)
Physical Medicine and Rehabilitation Admission H&P    Chief Complaint  Patient presents with  . Functional deficits due to Stroke  : HPI: [name] is an 86 year old female with history of A fib/flutter s/p flutter--off AC due to NSR, recent treatment for PNA who was admitted on 11/13/2019 with left hemiparesis and right gaze preference. CTA head unremarkable for acute intracranial process and CTA showed proximal Right PCA occlusion with emphysema and layering left pleural effusion c/w PNA. She underwent cerebral angio with revascularization of R-PCA by Dr. Earleen Newport. MRI/MRA brain done revealing right thalamic infarct with mild SAH in interpeduncular cistern and small areas of acute infarct in right cerebellum and right frontal cortex.  Echocardiogram with EF of 60-65% with severe asymmetric basal septal hypertrophy of LV and mild aortic sclerosis with trivial regurgitation. She completed 7 day course of Augmentin for PNA on 10/26.   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 She underwent ultrasound guided trhombin injection of R-CFA with good results on 10/22. Eliquis added on 10/24. She continued to have progressive drop in H/H 13.3-->10.5-->8.8 with dizziness and nausea and reported worsening of right groin pain on 10/29. CTA abdomen/pelvis showed recurrent pseudoaneurysm.  Dr. Scot Dock consulted for input and recommended repeat thrombin injection which was performed by IR on 10/27. Post procedure scan ultrasound the next day showed closed pseudoaneurysm and Eliquis resumed yesterday. Therapy has been ongoing and  CIR recommended due to functional decline. Please see preadmission assessment from earlier today as well.   Review of Systems  Constitutional: Positive for malaise/fatigue. Negative for chills and fever.  HENT: Positive for hearing loss (feels plugged). Negative for tinnitus.   Eyes: Negative for blurred vision and double vision.    Respiratory: Negative for cough and hemoptysis.   Cardiovascular: Negative for chest pain and palpitations.  Gastrointestinal: Positive for constipation. Negative for nausea and vomiting.  Genitourinary: Negative for dysuria and urgency.  Musculoskeletal: Positive for myalgias (RLE pain with movement).  Skin: Negative for itching and rash.  Neurological: Positive for dizziness (with activity) and weakness. Negative for sensory change, speech change, focal weakness and headaches.  Psychiatric/Behavioral: The patient has insomnia.   All other systems reviewed and are negative.  Past Medical History:  Diagnosis Date  . Atrial fibrillation ([place])    Prior treatment with amiodarone, held sinus rhythm and drug stopped  . Atrial flutter ([place])    Status post ablation prior to  2009  . Chest pain    Nuclear, 2004, no scar or ischemia, EF 77%  . Ejection fraction    EF 70%, nuclear,  2004  //     . Fall    July, 2014  . H/O bladder repair surgery   . Right Achilles tendinitis   . Sciatica     Past Surgical History:  Procedure Laterality Date  . IR CT HEAD LTD  11/14/2019  . IR PERCUTANEOUS ART THROMBECTOMY/INFUSION INTRACRANIAL INC DIAG ANGIO  11/14/2019  . IR US GUIDE VASC ACCESS RIGHT  11/14/2019  . no surgical hx    . RADIOLOGY WITH ANESTHESIA N/A 11/13/2019   Procedure: IR WITH ANESTHESIA;  Surgeon: Luanne Bras, MD;  Location: [facility];  Service: Radiology;  Laterality: N/A;    Family History  Problem Relation Age of Onset  . Cancer Mother   . Cancer Father        pancreatic cancer  . Heart disease Brother     Social History:  Married.  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 Quit smoking 45 years ago. Plays bridge once a week. She has never used smokeless tobacco. She reports current alcohol use--glass of wine daily. She reports that she does not use drugs.    Allergies  Allergen Reactions  . Bee Venom Anaphylaxis  . Mixed Vespid Venom  Anaphylaxis  . Codeine Other (See Comments)    Not known  . Sulfa Antibiotics Itching and Nausea Only  . Erythromycin Nausea And Vomiting    Medications Prior to Admission  Medication Sig Dispense Refill  . amoxicillin-clavulanate (AUGMENTIN) 875-125 MG tablet Take 1 tablet by mouth 2 (two) times daily.    Marland Kitchen aspirin 81 MG tablet Take 1 tablet (81 mg total) by mouth daily. 30 tablet 3  . atorvastatin (LIPITOR) 10 MG tablet Take 10 mg by mouth daily.    Marland Kitchen BIOTIN PO Take 1 tablet by mouth daily.     Marland Kitchen CALCIUM PO Take 1 tablet by mouth daily.     . Cholecalciferol (VITAMIN D3) 50 MCG (2000 UT) CAPS Take 2,000 Units by mouth daily.    . Coenzyme Q10 (CO Q-10 PO) Take 1 tablet by mouth daily.     . metoprolol succinate (TOPROL-XL) 50 MG 24 hr tablet Take 50 mg by mouth daily. Take with or immediately following a meal.    . EPINEPHrine 0.3 mg/0.3 mL IJ SOAJ injection Inject 0.3 mg into the muscle as needed for anaphylaxis.     . Investigational - Study Medication Additional Study Details    . trimethoprim (TRIMPEX) 100 MG tablet Take 100 mg by mouth as directed. (Patient not taking: Reported on 11/14/2019)      Drug Regimen Review  Drug regimen was reviewed and remains appropriate with no significant issues identified  Home: Home Living Family/patient expects to be discharged to:: Private residence Living Arrangements: Spouse/significant other Available Help at Discharge: Family, Available 24 hours/day Type of Home: House Home Access: Stairs to enter [employer] of Steps: 1 Entrance Stairs-Rails: Right Home Layout: One level Bathroom Shower/Tub: Tub only, Multimedia programmer: Standard Bathroom Accessibility: Yes Home Equipment: [place] - single point  Lives With: Spouse   Functional History: Prior Function Level of Independence: Independent Comments: reports independent and driving, managing her medication (takes care of her husbands meds too)  Functional  Status:  Mobility: Bed Mobility Overal bed mobility: Needs Assistance Bed Mobility: Supine to Sit Rolling: Mod assist Sidelying to sit: Mod assist, +2 for safety/equipment Supine to sit: Min assist, HOB elevated General bed mobility comments: min assist for trunk elevation off of bed, scooting to EOB. Transfers Overall transfer level: Needs assistance Equipment used: Rolling walker (2 wheeled) Transfers: Sit to/from Stand Sit to Stand: Mod assist Stand pivot transfers: Mod assist, +2 safety/equipment General transfer comment: mod assist for power up, correcting posterior postural bias. stand x3 during session, from EOB, toilet, and recliner. Ambulation/Gait Ambulation/Gait assistance: Min assist Gait Distance (Feet): 10 Feet (x2) Assistive device: Rolling walker (2 wheeled) Gait Pattern/deviations: Step-through pattern, Decreased stride length, Shuffle, Trunk flexed General Gait Details: min assist to steady, guide RW, frequent verbal/tactile cuing for positioning in RW Gait velocity: decr Gait velocity interpretation: <1.31 ft/sec, indicative of household ambulator    ADL: ADL Overall ADL's : Needs assistance/impaired Grooming: Brushing hair, Sitting, Set up Grooming Details (indicate cue type and reason): from recliner, cues for thoroughness Upper Body Bathing: Minimal assistance, Sitting Lower Body Bathing: Minimal assistance, Sit to/from  stand Upper Body Dressing : Minimal assistance, Sitting Lower Body Dressing: Minimal assistance, Sit to/from stand Toilet Transfer: Minimal assistance, Moderate assistance, RW, Stand-pivot Toilet Transfer Details (indicate cue type and reason): simulated via functional mobility, MIN A for stand pivot transfer with RW, up to MOD A for balance d/t initial posterior lean in standing Functional mobility during ADLs: Moderate assistance, Cueing for sequencing, Cueing for safety, Rolling walker General ADL Comments: pt with decreased activity  tolerance, increased pain, and further weakness due to extended bed rest from procedure  Cognition: Cognition Overall Cognitive Status: Impaired/Different from baseline Arousal/Alertness: Awake/alert Orientation Level: Oriented X4 Attention: Sustained Sustained Attention: Impaired Sustained Attention Impairment: Verbal basic, Functional basic Memory: Impaired Memory Impairment: Retrieval deficit, Decreased recall of new information, Decreased short term memory Decreased Short Term Memory: Verbal basic, Functional basic Immediate Memory Recall: Sock, Blue, Bed Memory Recall Sock: With Cue Memory Recall Blue: Not able to recall Memory Recall Bed: Without Cue Awareness: Impaired Awareness Impairment: Emergent impairment Problem Solving: Impaired Problem Solving Impairment: Verbal complex, Functional complex Executive Function: Writer: Impaired Organizing Impairment: Verbal basic, Functional basic Safety/Judgment: Impaired Comments: Stated she could go home and "function close to normal" Cognition Arousal/Alertness: Awake/alert Behavior During Therapy: WFL for tasks assessed/performed Overall Cognitive Status: Impaired/Different from baseline Area of Impairment: Attention, Memory, Following commands, Safety/judgement, Problem solving Current Attention Level: Sustained Memory: Decreased short-term memory Following Commands: Follows one step commands with increased time Safety/Judgement: Decreased awareness of safety Awareness: Intellectual Problem Solving: Slow processing, Decreased initiation, Difficulty sequencing, Requires verbal cues, Requires tactile cues General Comments: Pt requires step-by-step cuing for ambulation from bed to bathroom today, stating "okay, what do I do now?" when reaching doorframe of bathroom. Pleasant and motivated today.   Blood pressure 127/82, pulse 87, temperature 98.2 F (36.8 C), temperature source Oral, resp. rate 18, height 5\' 1"   (1.549 m), weight 66.4 kg, SpO2 96 %. Physical Exam Vitals and nursing note reviewed.  Constitutional:      General: She is not in acute distress.    Appearance: Normal appearance.  HENT:     Head: Normocephalic and atraumatic.     Right Ear: External ear normal.     Left Ear: External ear normal.     Nose: Nose normal.  Eyes:     General:        Right eye: No discharge.        Left eye: No discharge.     Extraocular Movements: Extraocular movements intact.  Cardiovascular:     Rate and Rhythm: Rhythm regularly irregular.     Comments: Irregularly irregular Pulmonary:     Effort: Pulmonary effort is normal. No respiratory distress.     Breath sounds: No stridor.     Comments: Scattered rales RLL Abdominal:     General: Abdomen is flat. Bowel sounds are normal. There is no distension.  Musculoskeletal:     Cervical back: Normal range of motion and neck supple.     Comments: No edema or tenderness in extremities  Skin:    General: Skin is warm and dry.     Comments: Right medial thigh hematoma  Neurological:     Mental Status: She is alert and oriented to person, place, and time.     Comments: Alert Mild left facial weakness. Speech clear and able to answer orientation questions without difficulty. She was able to follow one and two step commands. Motor: RUE: 5/5 proximal distal LUE: 5/5 proximal distal LLE: 5/5 proximal distal R  LE: Hip flexion knee extension, limited to pain/apprehension, ankle dorsiflexion 4+/5 No dysmetria  Psychiatric:        Mood and Affect: Mood normal.        Behavior: Behavior normal.        Thought Content: Thought content normal.     No results found for this or any previous visit (from the past 48 hour(s)). VAS [place] GROIN PSEUDOANEURYSM  Result Date: 11/22/2019  ARTERIAL PSEUDOANEURYSM  Exam: Right groin History: S/p thrombin injection 11/21/2019. Comparison Study: Prior study done 11/16/19, pseudoaneurysm found. Performing Technologist:  Sharion Dove RVS  Examination Guidelines: A complete evaluation includes B-mode imaging, spectral Doppler, color Doppler, and power Doppler as needed of all accessible portions of each vessel. Bilateral testing is considered an integral part of a complete examination. Limited examinations for reoccurring indications may be performed as noted.  Findings: A mixed echogenic structure measuring approximately 3.0 cm x 7.6 cm is visualized at the proximal thigh with ultrasound characteristics of a hematoma.  Summary: Pseudoaneurysm remains closed.  Diagnosing physician: Monica Martinez MD Electronically signed by Monica Martinez MD on 11/22/2019 at 3:01:48 PM.   --------------------------------------------------------------------------------    Final     Medical Problem List and Plan: 1.  Deficits with mobility, endurance, self-care secondary to acute infarct in right cerebellum, right frontal cortex, right thalamus with mild SAH.  -patient may shower  -ELOS/Goals: 7-12 days/mod I/supervision  Admit to CIR 2.  Antithrombotics: -DVT/anticoagulation:  Pharmaceutical: Other (comment)--Elquis  -antiplatelet therapy: N/A 3. Right groin pain/Pain Management: Tylenol prn 4. Mood: LCSW to follow for evaluation and support.   -antipsychotic agents: N/A 5. Neuropsych: This patient is capable of making decisions on her own behalf. 6. Skin/Wound Care: Monitor right groin daily. Routine pressure relief measures.  7. Fluids/Electrolytes/Nutrition: Encourage intake.  CMP ordered. 8. XEN:MMHWKGS BP tid  Monitor with increased mobility. 9. A fib: HR controlled off metoprolol.  Eliquis resumed 10/28.   Monitor with increased exertion. 10. Right groin pseudoaneurysm: Resolved--continue to monitor daily.  11. Acute blood loss anemia:   CBC ordered. 12.  Chronic UTIs: Resume suppressive therapy. 13. Slow transit constipation: managed with colace and miralax.   Adjust all meds as necessary  Bary Leriche,  PA-C 11/23/2019  I have personally performed a face to face diagnostic evaluation, including, but not limited to relevant history and physical exam findings, of this patient and developed relevant assessment and plan.  Additionally, I have reviewed and concur with the physician assistant's documentation above.  Delice Lesch, MD, ABPMR

## 2019-11-23 NOTE — H&P (Signed)
Physical Medicine and Rehabilitation Admission H&P    Chief Complaint  Patient presents with  . Functional deficits due to Stroke  : HPI: [name] is an 86 year old female with history of A fib/flutter s/p flutter--off AC due to NSR, recent treatment for PNA who was admitted on 11/13/2019 with left hemiparesis and right gaze preference. CTA head unremarkable for acute intracranial process and CTA showed proximal Right PCA occlusion with emphysema and layering left pleural effusion c/w PNA. She underwent cerebral angio with revascularization of R-PCA by Dr. Earleen Newport. MRI/MRA brain done revealing right thalamic infarct with mild SAH in interpeduncular cistern and small areas of acute infarct in right cerebellum and right frontal cortex.  Echocardiogram with EF of 60-65% with severe asymmetric basal septal hypertrophy of LV and mild aortic sclerosis with trivial regurgitation. She completed 7 day course of Augmentin for PNA on 10/26.   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 She underwent ultrasound guided trhombin injection of R-CFA with good results on 10/22. Eliquis added on 10/24. She continued to have progressive drop in H/H 13.3-->10.5-->8.8 with dizziness and nausea and reported worsening of right groin pain on 10/29. CTA abdomen/pelvis showed recurrent pseudoaneurysm.  Dr. Scot Dock consulted for input and recommended repeat thrombin injection which was performed by IR on 10/27. Post procedure scan ultrasound the next day showed closed pseudoaneurysm and Eliquis resumed yesterday. Therapy has been ongoing and  CIR recommended due to functional decline. Please see preadmission assessment from earlier today as well.   Review of Systems  Constitutional: Positive for malaise/fatigue. Negative for chills and fever.  HENT: Positive for hearing loss (feels plugged). Negative for tinnitus.   Eyes: Negative for blurred vision and double vision.   Respiratory: Negative for cough and hemoptysis.   Cardiovascular: Negative for chest pain and palpitations.  Gastrointestinal: Positive for constipation. Negative for nausea and vomiting.  Genitourinary: Negative for dysuria and urgency.  Musculoskeletal: Positive for myalgias (RLE pain with movement).  Skin: Negative for itching and rash.  Neurological: Positive for dizziness (with activity) and weakness. Negative for sensory change, speech change, focal weakness and headaches.  Psychiatric/Behavioral: The patient has insomnia.   All other systems reviewed and are negative.  Past Medical History:  Diagnosis Date  . Atrial fibrillation ([place])    Prior treatment with amiodarone, held sinus rhythm and drug stopped  . Atrial flutter ([place])    Status post ablation prior to  2009  . Chest pain    Nuclear, 2004, no scar or ischemia, EF 77%  . Ejection fraction    EF 70%, nuclear,  2004  //     . Fall    July, 2014  . H/O bladder repair surgery   . Right Achilles tendinitis   . Sciatica     Past Surgical History:  Procedure Laterality Date  . IR CT HEAD LTD  11/14/2019  . IR PERCUTANEOUS ART THROMBECTOMY/INFUSION INTRACRANIAL INC DIAG ANGIO  11/14/2019  . IR US GUIDE VASC ACCESS RIGHT  11/14/2019  . no surgical hx    . RADIOLOGY WITH ANESTHESIA N/A 11/13/2019   Procedure: IR WITH ANESTHESIA;  Surgeon: Luanne Bras, MD;  Location: [facility];  Service: Radiology;  Laterality: N/A;    Family History  Problem Relation Age of Onset  . Cancer Mother   . Cancer Father        pancreatic cancer  . Heart disease Brother     Social History:  Married. 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 Quit smoking 45 years ago. Plays bridge once a week. She has never used smokeless tobacco. She reports current alcohol use--glass of wine daily. She reports that she does not use drugs.    Allergies  Allergen Reactions  . Bee Venom Anaphylaxis  . Mixed Vespid Venom  Anaphylaxis  . Codeine Other (See Comments)    Not known  . Sulfa Antibiotics Itching and Nausea Only  . Erythromycin Nausea And Vomiting    Medications Prior to Admission  Medication Sig Dispense Refill  . amoxicillin-clavulanate (AUGMENTIN) 875-125 MG tablet Take 1 tablet by mouth 2 (two) times daily.    Marland Kitchen aspirin 81 MG tablet Take 1 tablet (81 mg total) by mouth daily. 30 tablet 3  . atorvastatin (LIPITOR) 10 MG tablet Take 10 mg by mouth daily.    Marland Kitchen BIOTIN PO Take 1 tablet by mouth daily.     Marland Kitchen CALCIUM PO Take 1 tablet by mouth daily.     . Cholecalciferol (VITAMIN D3) 50 MCG (2000 UT) CAPS Take 2,000 Units by mouth daily.    . Coenzyme Q10 (CO Q-10 PO) Take 1 tablet by mouth daily.     . metoprolol succinate (TOPROL-XL) 50 MG 24 hr tablet Take 50 mg by mouth daily. Take with or immediately following a meal.    . EPINEPHrine 0.3 mg/0.3 mL IJ SOAJ injection Inject 0.3 mg into the muscle as needed for anaphylaxis.     . Investigational - Study Medication Additional Study Details    . trimethoprim (TRIMPEX) 100 MG tablet Take 100 mg by mouth as directed. (Patient not taking: Reported on 11/14/2019)      Drug Regimen Review  Drug regimen was reviewed and remains appropriate with no significant issues identified  Home: Home Living Family/patient expects to be discharged to:: Private residence Living Arrangements: Spouse/significant other Available Help at Discharge: Family, Available 24 hours/day Type of Home: House Home Access: Stairs to enter [employer] of Steps: 1 Entrance Stairs-Rails: Right Home Layout: One level Bathroom Shower/Tub: Tub only, Multimedia programmer: Standard Bathroom Accessibility: Yes Home Equipment: [place] - single point  Lives With: Spouse   Functional History: Prior Function Level of Independence: Independent Comments: reports independent and driving, managing her medication (takes care of her husbands meds too)  Functional  Status:  Mobility: Bed Mobility Overal bed mobility: Needs Assistance Bed Mobility: Supine to Sit Rolling: Mod assist Sidelying to sit: Mod assist, +2 for safety/equipment Supine to sit: Min assist, HOB elevated General bed mobility comments: min assist for trunk elevation off of bed, scooting to EOB. Transfers Overall transfer level: Needs assistance Equipment used: Rolling walker (2 wheeled) Transfers: Sit to/from Stand Sit to Stand: Mod assist Stand pivot transfers: Mod assist, +2 safety/equipment General transfer comment: mod assist for power up, correcting posterior postural bias. stand x3 during session, from EOB, toilet, and recliner. Ambulation/Gait Ambulation/Gait assistance: Min assist Gait Distance (Feet): 10 Feet (x2) Assistive device: Rolling walker (2 wheeled) Gait Pattern/deviations: Step-through pattern, Decreased stride length, Shuffle, Trunk flexed General Gait Details: min assist to steady, guide RW, frequent verbal/tactile cuing for positioning in RW Gait velocity: decr Gait velocity interpretation: <1.31 ft/sec, indicative of household ambulator    ADL: ADL Overall ADL's : Needs assistance/impaired Grooming: Brushing hair, Sitting, Set up Grooming Details (indicate cue type and reason): from recliner, cues for thoroughness Upper Body Bathing: Minimal assistance, Sitting Lower Body Bathing: Minimal assistance, Sit to/from stand  Upper Body Dressing : Minimal assistance, Sitting Lower Body Dressing: Minimal assistance, Sit to/from stand Toilet Transfer: Minimal assistance, Moderate assistance, RW, Stand-pivot Toilet Transfer Details (indicate cue type and reason): simulated via functional mobility, MIN A for stand pivot transfer with RW, up to MOD A for balance d/t initial posterior lean in standing Functional mobility during ADLs: Moderate assistance, Cueing for sequencing, Cueing for safety, Rolling walker General ADL Comments: pt with decreased activity  tolerance, increased pain, and further weakness due to extended bed rest from procedure  Cognition: Cognition Overall Cognitive Status: Impaired/Different from baseline Arousal/Alertness: Awake/alert Orientation Level: Oriented X4 Attention: Sustained Sustained Attention: Impaired Sustained Attention Impairment: Verbal basic, Functional basic Memory: Impaired Memory Impairment: Retrieval deficit, Decreased recall of new information, Decreased short term memory Decreased Short Term Memory: Verbal basic, Functional basic Immediate Memory Recall: Sock, Blue, Bed Memory Recall Sock: With Cue Memory Recall Blue: Not able to recall Memory Recall Bed: Without Cue Awareness: Impaired Awareness Impairment: Emergent impairment Problem Solving: Impaired Problem Solving Impairment: Verbal complex, Functional complex Executive Function: Writer: Impaired Organizing Impairment: Verbal basic, Functional basic Safety/Judgment: Impaired Comments: Stated she could go home and "function close to normal" Cognition Arousal/Alertness: Awake/alert Behavior During Therapy: WFL for tasks assessed/performed Overall Cognitive Status: Impaired/Different from baseline Area of Impairment: Attention, Memory, Following commands, Safety/judgement, Problem solving Current Attention Level: Sustained Memory: Decreased short-term memory Following Commands: Follows one step commands with increased time Safety/Judgement: Decreased awareness of safety Awareness: Intellectual Problem Solving: Slow processing, Decreased initiation, Difficulty sequencing, Requires verbal cues, Requires tactile cues General Comments: Pt requires step-by-step cuing for ambulation from bed to bathroom today, stating "okay, what do I do now?" when reaching doorframe of bathroom. Pleasant and motivated today.   Blood pressure 127/82, pulse 87, temperature 98.2 F (36.8 C), temperature source Oral, resp. rate 18, height 5\' 1"   (1.549 m), weight 66.4 kg, SpO2 96 %. Physical Exam Vitals and nursing note reviewed.  Constitutional:      General: She is not in acute distress.    Appearance: Normal appearance.  HENT:     Head: Normocephalic and atraumatic.     Right Ear: External ear normal.     Left Ear: External ear normal.     Nose: Nose normal.  Eyes:     General:        Right eye: No discharge.        Left eye: No discharge.     Extraocular Movements: Extraocular movements intact.  Cardiovascular:     Rate and Rhythm: Rhythm regularly irregular.     Comments: Irregularly irregular Pulmonary:     Effort: Pulmonary effort is normal. No respiratory distress.     Breath sounds: No stridor.     Comments: Scattered rales RLL Abdominal:     General: Abdomen is flat. Bowel sounds are normal. There is no distension.  Musculoskeletal:     Cervical back: Normal range of motion and neck supple.     Comments: No edema or tenderness in extremities  Skin:    General: Skin is warm and dry.     Comments: Right medial thigh hematoma  Neurological:     Mental Status: She is alert and oriented to person, place, and time.     Comments: Alert Mild left facial weakness. Speech clear and able to answer orientation questions without difficulty. She was able to follow one and two step commands. Motor: RUE: 5/5 proximal distal LUE: 5/5 proximal distal LLE: 5/5 proximal distal R LE:  Hip flexion knee extension, limited to pain/apprehension, ankle dorsiflexion 4+/5 No dysmetria  Psychiatric:        Mood and Affect: Mood normal.        Behavior: Behavior normal.        Thought Content: Thought content normal.     No results found for this or any previous visit (from the past 48 hour(s)). VAS [place] GROIN PSEUDOANEURYSM  Result Date: 11/22/2019  ARTERIAL PSEUDOANEURYSM  Exam: Right groin History: S/p thrombin injection 11/21/2019. Comparison Study: Prior study done 11/16/19, pseudoaneurysm found. Performing Technologist:  Sharion Dove RVS  Examination Guidelines: A complete evaluation includes B-mode imaging, spectral Doppler, color Doppler, and power Doppler as needed of all accessible portions of each vessel. Bilateral testing is considered an integral part of a complete examination. Limited examinations for reoccurring indications may be performed as noted.  Findings: A mixed echogenic structure measuring approximately 3.0 cm x 7.6 cm is visualized at the proximal thigh with ultrasound characteristics of a hematoma.  Summary: Pseudoaneurysm remains closed.  Diagnosing physician: Monica Martinez MD Electronically signed by Monica Martinez MD on 11/22/2019 at 3:01:48 PM.   --------------------------------------------------------------------------------    Final     Medical Problem List and Plan: 1.  Deficits with mobility, endurance, self-care secondary to acute infarct in right cerebellum, right frontal cortex, right thalamus with mild SAH.  -patient may shower  -ELOS/Goals: 7-12 days/mod I/supervision  Admit to CIR 2.  Antithrombotics: -DVT/anticoagulation:  Pharmaceutical: Other (comment)--Elquis  -antiplatelet therapy: N/A 3. Right groin pain/Pain Management: Tylenol prn 4. Mood: LCSW to follow for evaluation and support.   -antipsychotic agents: N/A 5. Neuropsych: This patient is capable of making decisions on her own behalf. 6. Skin/Wound Care: Monitor right groin daily. Routine pressure relief measures.  7. Fluids/Electrolytes/Nutrition: Encourage intake.  CMP ordered. 8. WTU:UEKCMKL BP tid  Monitor with increased mobility. 9. A fib: HR controlled off metoprolol.  Eliquis resumed 10/28.   Monitor with increased exertion. 10. Right groin pseudoaneurysm: Resolved--continue to monitor daily.  11. Acute blood loss anemia:   CBC ordered. 12.  Chronic UTIs: Resume suppressive therapy. 13. Slow transit constipation: managed with colace and miralax.   Adjust all meds as necessary  Bary Leriche,  PA-C 11/23/2019  I have personally performed a face to face diagnostic evaluation, including, but not limited to relevant history and physical exam findings, of this patient and developed relevant assessment and plan.  Additionally, I have reviewed and concur with the physician assistant's documentation above.  Delice Lesch, MD, ABPMR  The patient's status has not changed. Any changes from the pre-admission screening or documentation from the acute chart are noted above.   Delice Lesch, MD, ABPMR

## 2019-11-24 ENCOUNTER — Inpatient Hospital Stay (HOSPITAL_COMMUNITY): Payer: Medicare Other

## 2019-11-24 ENCOUNTER — Inpatient Hospital Stay (HOSPITAL_COMMUNITY): Payer: Medicare Other | Admitting: Physical Therapy

## 2019-11-24 DIAGNOSIS — I482 Chronic atrial fibrillation, unspecified: Secondary | ICD-10-CM

## 2019-11-24 DIAGNOSIS — R350 Frequency of micturition: Secondary | ICD-10-CM

## 2019-11-24 DIAGNOSIS — I63531 Cerebral infarction due to unspecified occlusion or stenosis of right posterior cerebral artery: Secondary | ICD-10-CM

## 2019-11-24 LAB — CBC WITH DIFFERENTIAL/PLATELET
Abs Immature Granulocytes: 0.11 10*3/uL — ABNORMAL HIGH (ref 0.00–0.07)
Basophils Absolute: 0.1 10*3/uL (ref 0.0–0.1)
Basophils Relative: 1 %
Eosinophils Absolute: 0.3 10*3/uL (ref 0.0–0.5)
Eosinophils Relative: 4 %
HCT: 32 % — ABNORMAL LOW (ref 36.0–46.0)
Hemoglobin: 10.2 g/dL — ABNORMAL LOW (ref 12.0–15.0)
Immature Granulocytes: 1 %
Lymphocytes Relative: 22 %
Lymphs Abs: 1.7 10*3/uL (ref 0.7–4.0)
MCH: 31.4 pg (ref 26.0–34.0)
MCHC: 31.9 g/dL (ref 30.0–36.0)
MCV: 98.5 fL (ref 80.0–100.0)
Monocytes Absolute: 0.8 10*3/uL (ref 0.1–1.0)
Monocytes Relative: 11 %
Neutro Abs: 4.7 10*3/uL (ref 1.7–7.7)
Neutrophils Relative %: 61 %
Platelets: 373 10*3/uL (ref 150–400)
RBC: 3.25 MIL/uL — ABNORMAL LOW (ref 3.87–5.11)
RDW: 13.7 % (ref 11.5–15.5)
WBC: 7.6 10*3/uL (ref 4.0–10.5)
nRBC: 0 % (ref 0.0–0.2)

## 2019-11-24 NOTE — Progress Notes (Signed)
Patient reports incontinent PTA. Timed toileting, no continent voids. PVR=90cc's. Requesting purewick.  Right groin and upper thigh with bruising and swelling. Complains of pain to RLE, but denies need for tylenol. Persistent dry cough, new since this admission. [name] A

## 2019-11-24 NOTE — Progress Notes (Signed)
[place] PHYSICAL MEDICINE & REHABILITATION PROGRESS NOTE   Subjective/Complaints: Frustrated d/t urinary urgency/frequency with associated incontinence. Empties her bladder a lot at home but able to get up to use toilet. At night she is unable to get assistance soon enough. States that emptying pattern is similar to that of home.  ROS: Patient denies fever, rash, sore throat, blurred vision, nausea, vomiting, diarrhea, cough, shortness of breath or chest pain, joint or back pain, headache, or mood change.     Objective:   No results found. Recent Labs    11/24/19 0449  WBC 7.6  HGB 10.2*  HCT 32.0*  PLT 373   No results for input(s): NA, K, CL, CO2, GLUCOSE, BUN, CREATININE, CALCIUM in the last 72 hours.  Intake/Output Summary (Last 24 hours) at 11/24/2019 1044 Last data filed at 11/23/2019 2300 Gross per 24 hour  Intake 320 ml  Output 250 ml  Net 70 ml        Physical Exam: Vital Signs Blood pressure 133/61, pulse 98, temperature 98.3 F (36.8 C), resp. rate 17, height 5' (1.524 m), weight 62.8 kg, SpO2 98 %.  General: Alert and oriented x 3, No apparent distress HEENT: Head is normocephalic, atraumatic, PERRLA, EOMI, sclera anicteric, oral mucosa pink and moist, dentition intact, ext ear canals clear,  Neck: Supple without JVD or lymphadenopathy Heart: IRR IRR. No murmurs rubs or gallops Chest: some crackles right base. Normal effort Abdomen: Soft, non-tender, non-distended, bowel sounds positive. Extremities: No clubbing, cyanosis, or edema. Pulses are 2+ Skin: Clean and intact without signs of breakdown. Right thigh hematoma Neuro: Pt is cognitively appropriate with normal insight, memory, and awareness. Cranial nerves 2-12 are intact. Sensory exam is normal. Reflexes are 2+ in all 4's. Fine motor coordination is intact. No tremors. Motor function is grossly 5/5 UE. LLE 5-/5. RLE 4- to 4/5.   Musculoskeletal: Full ROM, No pain with AROM or PROM in the neck,  trunk, or extremities. Posture appropriate Psych: Pt's affect is appropriate. Pt is cooperative     Assessment/Plan: 1. Functional deficits secondary to right cerebellar, frontal and thalamic infarcts with [facility] which require 3+ hours per day of interdisciplinary therapy in a comprehensive inpatient rehab setting.  Physiatrist is providing close team supervision and 24 hour management of active medical problems listed below.  Physiatrist and rehab team continue to assess barriers to discharge/monitor patient progress toward functional and medical goals  Care Tool:  Bathing    Body parts bathed by patient: Right arm, Left arm, Chest, Abdomen, Front perineal area, Buttocks, Right upper leg, Left upper leg, Right lower leg, Left lower leg, Face         Bathing assist Assist Level: Supervision/Verbal cueing     Upper Body Dressing/Undressing Upper body dressing   What is the patient wearing?: Pull over shirt, Bra    Upper body assist Assist Level: Supervision/Verbal cueing    Lower Body Dressing/Undressing Lower body dressing      What is the patient wearing?: Underwear/pull up, Pants     Lower body assist Assist for lower body dressing: Minimal Assistance - Patient > 75%     Toileting Toileting    Toileting assist Assist for toileting: Maximal Assistance - Patient 25 - 49% (pt req bed level cleansing d/t labile BP on toilet)     Transfers Chair/bed transfer  Transfers assist     Chair/bed transfer assist level: Minimal Assistance - Patient > 75%     Locomotion Ambulation   Ambulation assist  Walk 10 feet activity   Assist           Walk 50 feet activity   Assist           Walk 150 feet activity   Assist           Walk 10 feet on uneven surface  activity   Assist           Wheelchair     Assist               Wheelchair 50 feet with 2 turns activity    Assist            Wheelchair 150  feet activity     Assist          Blood pressure 133/61, pulse 98, temperature 98.3 F (36.8 C), resp. rate 17, height 5' (1.524 m), weight 62.8 kg, SpO2 98 %.  Medical Problem List and Plan: 1.  Deficits with mobility, endurance, self-care secondary to acute infarct in right cerebellum, right frontal cortex, right thalamus with mild SAH.             -patient may shower             -ELOS/Goals: 7-12 days/mod I/supervision             beginning therapies 2.  Antithrombotics: -DVT/anticoagulation:  Pharmaceutical: Other (comment)--Elquis             -antiplatelet therapy: N/A 3. Right groin pain/Pain Management: Tylenol prn 4. Mood: LCSW to follow for evaluation and support.              -antipsychotic agents: N/A 5. Neuropsych: This patient is capable of making decisions on her own behalf. 6. Skin/Wound Care: Monitor right groin daily. Routine pressure relief measures.  7. Fluids/Electrolytes/Nutrition: Encourage intake.  CMP ordered. 8. DZH:GDJMEQA BP tid             10/30 bp's controlled 9. A fib: HR controlled off metoprolol.  Eliquis resumed 10/28.              HR in 90's. 10. Right groin pseudoaneurysm: Resolved--continue to monitor daily.  11. Acute blood loss anemia:              CBC ordered. 12.  Chronic UTIs/urinary frequency: Resumed suppressive therapy.  -check PVR's  -purewick catheter for HS frequency 13. Slow transit constipation: managed with colace and miralax.              Adjust all meds as necessary   -bm 10/30  LOS: 1 days A FACE TO [place] 11/24/2019, 10:44 AM

## 2019-11-24 NOTE — Evaluation (Signed)
Physical Therapy Assessment and Plan  Patient Details  Name: [name] MRN: [id number] Date of Birth: 1931/10/02  PT Diagnosis: Abnormal posture, Abnormality of gait, Difficulty walking, Hemiparesis non-dominant and Muscle weakness Rehab Potential: Good ELOS: 10-14 days   Today's Date: 11/24/2019 PT Individual Time: 1422-1530 PT Individual Time Calculation (min): 68 min    Hospital Problem: Principal Problem:   Acute ischemic right PCA stroke ([place]) s/p tPA and mechanical thrombectomy, d/t AF not on [facility]   Past Medical History:  Past Medical History:  Diagnosis Date  . Atrial fibrillation ([place])    Prior treatment with amiodarone, held sinus rhythm and drug stopped  . Atrial flutter ([place])    Status post ablation prior to  2009  . Chest pain    Nuclear, 2004, no scar or ischemia, EF 77%  . Ejection fraction    EF 70%, nuclear,  2004  //     . Fall    July, 2014  . H/O bladder repair surgery   . Right Achilles tendinitis   . Sciatica    Past Surgical History:  Past Surgical History:  Procedure Laterality Date  . IR CT HEAD LTD  11/14/2019  . IR PERCUTANEOUS ART THROMBECTOMY/INFUSION INTRACRANIAL INC DIAG ANGIO  11/14/2019  . IR US GUIDE VASC ACCESS RIGHT  11/14/2019  . no surgical hx    . RADIOLOGY WITH ANESTHESIA N/A 11/13/2019   Procedure: IR WITH ANESTHESIA;  Surgeon: Luanne Bras, MD;  Location: [facility];  Service: Radiology;  Laterality: N/A;    Assessment & Plan Clinical Impression: Patient is a 86 y.o. year old female with  history of A fib/flutter s/p flutter--off AC due to NSR, recent treatment for PNA who was admitted on 11/13/2019 with left hemiparesis and right gaze preference. CTA head unremarkable for acute intracranial process and CTA showed proximal Right PCA occlusion with emphysema and layering left pleural effusion c/w PNA. She underwent cerebral angio with revascularization of R-PCA by Dr. Earleen Newport. MRI/MRA brain done revealing right thalamic infarct  with mild SAH in interpeduncular cistern and small areas of acute infarct in right cerebellum and right frontal cortex.  Echocardiogram with EF of 60-65% with severe asymmetric basal septal hypertrophy of LV and mild aortic sclerosis with trivial regurgitation. She completed 7 day course of Augmentin for PNA on 10/26.   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 She underwent ultrasound guided trhombin injection of R-CFA with good results on 10/22. Eliquis added on 10/24. She continued to have progressive drop in H/H 13.3-->10.5-->8.8 with dizziness and nausea and reported worsening of right groin pain on 10/29. CTA abdomen/pelvis showed recurrent pseudoaneurysm.  Dr. Scot Dock consulted for input and recommended repeat thrombin injection which was performed by IR on 10/27. Post procedure scan ultrasound the next day showed closed pseudoaneurysm and Eliquis resumed yesterday. Therapy has been ongoing and  CIR recommended due to functional decline.  Patient transferred to CIR on 11/23/2019 .   Patient currently requires min assist with mobility secondary to muscle weakness, decreased cardiorespiratoy endurance, impaired timing and sequencing, unbalanced muscle activation, decreased coordination and decreased motor planning,  , decreased attention to left, decreased attention, decreased awareness, decreased problem solving and delayed processing and decreased sitting balance, decreased standing balance, decreased postural control and decreased balance strategies.  Prior to hospitalization, patient was independent  with mobility and lived with Spouse in a House home.  Home access is 1Stairs to enter.  Patient will benefit from skilled PT intervention to  maximize safe functional mobility, minimize fall risk and decrease caregiver burden for planned discharge home with 24 hour supervision.  Anticipate patient will benefit from follow up [place] at discharge.  PT - End of  Session Activity Tolerance: Tolerates 30+ min activity with multiple rests Endurance Deficit: Yes Endurance Deficit Description: reports feeling weak and requires frequent seated rest breaks PT Assessment Rehab Potential (ACUTE/IP ONLY): Good PT Barriers to Discharge: Home environment access/layout;Neurogenic Bowel & Bladder;Incontinence PT Patient demonstrates impairments in the following area(s): Balance;Motor;Safety;Behavior;Nutrition;Sensory;Edema;Pain;Skin Integrity;Endurance;Perception PT Transfers Functional Problem(s): Bed Mobility;Bed to Chair;Car;Furniture;Floor PT Locomotion Functional Problem(s): Ambulation;Stairs PT Plan PT Intensity: Minimum of 1-2 x/day ,45 to 90 minutes PT Frequency: 5 out of 7 days PT Duration Estimated Length of Stay: 10-14 days PT Treatment/Interventions: Ambulation/gait training;Balance/vestibular training;Cognitive remediation/compensation;Community reintegration;Discharge planning;Disease management/prevention;DME/adaptive equipment instruction;Functional electrical stimulation;Functional mobility training;Neuromuscular re-education;Pain management;Patient/family education;Psychosocial support;Skin care/wound management;Splinting/orthotics;Stair training;Therapeutic Activities;Therapeutic Exercise;Visual/perceptual remediation/compensation;UE/LE Coordination activities;UE/LE Strength taining/ROM PT Transfers Anticipated Outcome(s): supervision PT Locomotion Anticipated Outcome(s): supervision PT Recommendation Recommendations for Other Services: Neuropsych consult Follow Up Recommendations: Home health PT;24 hour supervision/assistance Patient destination: Home Equipment Recommended: To be determined   PT Evaluation Precautions/Restrictions Precautions Precautions: Fall Precaution Comments: watch BP, dizziness Restrictions Weight Bearing Restrictions: No Pain Pain Assessment Pain Score: 0-No pain Home Living/Prior Functioning Home  Living Available Help at Discharge: Available 24 hours/day;Family Type of Home: House Home Access: Stairs to enter Entrance Stairs-Number of Steps: 1 Entrance Stairs-Rails: Right Home Layout: Two level;Able to live on main level with bedroom/bathroom  Lives With: Spouse (husband, Herbie [place]) Prior Function Level of Independence: Independent with gait;Independent with transfers;Independent with homemaking with ambulation (occasional use of SPC due to R Achille's tendon "overuse")  Able to Take Stairs?: Reciprically Driving: Yes 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 [employer] Comments: enjoys playing bridge 1-2x/week, 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 Perception  Perception Perception: Impaired Inattention/Neglect: Impaired-to be further tested in functional context (possible decreased L attention) Praxis Praxis: Intact  Cognition Overall Cognitive Status: Impaired/Different from baseline Arousal/Alertness: Awake/alert Orientation Level: Oriented X4 Attention: Focused;Sustained Focused Attention: Appears intact Sustained Attention: Appears intact Safety/Judgment: Impaired Sensation Sensation Light Touch: Appears Intact Hot/Cold: Not tested Proprioception: Impaired by gross assessment Stereognosis: Not tested Coordination Gross Motor Movements are Fluid and Coordinated: No Coordination and Movement Description: impaired due to mild L hemiparesis and impaired balance Motor  Motor Motor: Hemiplegia;Abnormal postural alignment and control Motor - Skilled Clinical Observations: mild L hemiparesis   Trunk/Postural Assessment  Cervical Assessment Cervical Assessment: Exceptions to [facility] (forward head) Thoracic Assessment Thoracic Assessment: Exceptions to [facility] (thoracic kyphosis) Lumbar Assessment Lumbar Assessment: Exceptions to [facility] (posterior pelvic tilt in sitting) Postural Control Postural Control: Deficits on evaluation Righting  Reactions: delayed and insufficient with pt demoing L lateral lean Protective Responses: delayed with impaired balance recovery strategy Postural Limitations: decreased due to L lean  Balance Balance Balance Assessed: Yes Static Sitting Balance Static Sitting - Balance Support: Feet supported Static Sitting - Level of Assistance: 5: Stand by assistance Dynamic Sitting Balance Dynamic Sitting - Level of Assistance: 5: Stand by assistance;Other (comment) (CGA) Static Standing Balance Static Standing - Balance Support: During functional activity Static Standing - Level of Assistance: 4: Min assist Dynamic Standing Balance Dynamic Standing - Balance Support: During functional activity Dynamic Standing - Level of Assistance: 3: Mod assist Extremity Assessment      RLE Assessment RLE Assessment: Exceptions to [facility] Active Range of Motion (AROM) Comments: WFL General Strength Comments: assessed in supine; limited resistance applied due  to pain from hematoma RLE Strength Right Hip Flexion: 4/5 Right Knee Flexion: 4/5 Right Knee Extension: 4/5 Right Ankle Dorsiflexion: 4/5 Right Ankle Plantar Flexion: 4/5 LLE Assessment LLE Assessment: Exceptions to [facility] Active Range of Motion (AROM) Comments: WFL General Strength Comments: assessed in supine LLE Strength Left Hip Flexion: 4/5 Left Knee Flexion: 4/5 Left Knee Extension: 4/5 Left Ankle Dorsiflexion: 4/5 Left Ankle Plantar Flexion: 4/5  Care Tool Care Tool Bed Mobility Roll left and right activity   Roll left and right assist level: Minimal Assistance - Patient > 75%    Sit to lying activity   Sit to lying assist level: Minimal Assistance - Patient > 75%    Lying to sitting edge of bed activity   Lying to sitting edge of bed assist level: Minimal Assistance - Patient > 75%     Care Tool Transfers Sit to stand transfer   Sit to stand assist level: Minimal Assistance - Patient > 75%    Chair/bed transfer   Chair/bed transfer  assist level: Minimal Assistance - Patient > 75%     Physiological scientist transfer assist level: Minimal Assistance - Patient > 75%      Care Tool Locomotion Ambulation   Assist level: Minimal Assistance - Patient > 75% Assistive device: Hand held assist Max distance: 73f  Walk 10 feet activity   Assist level: Minimal Assistance - Patient > 75% Assistive device: Hand held assist   Walk 50 feet with 2 turns activity   Assist level: Minimal Assistance - Patient > 75% Assistive device: Hand held assist  Walk 150 feet activity Walk 150 feet activity did not occur: Safety/medical concerns      Walk 10 feet on uneven surfaces activity   Assist level: Minimal Assistance - Patient > 75% (ramp) Assistive device: Hand held assist;Other (comment) (railing)  Stairs   Assist level: Minimal Assistance - Patient > 75% Stairs assistive device: 2 hand rails    Walk up/down 1 step activity   Walk up/down 1 step (curb) assist level: Minimal Assistance - Patient > 75% Walk up/down 1 step or curb assistive device: 2 hand rails    Walk up/down 4 steps activity Walk up/down 4 steps assist level: Minimal Assistance - Patient > 75% Walk up/down 4 steps assistive device: 2 hand rails  Walk up/down 12 steps activity Walk up/down 12 steps activity did not occur: Safety/medical concerns      Pick up small objects from floor   Pick up small object from the floor assist level: Minimal Assistance - Patient > 75%    Wheelchair Will patient use wheelchair at discharge?: No          Wheel 50 feet with 2 turns activity      Wheel 150 feet activity        Refer to Care Plan for Long Term Goals  SHORT TERM GOAL WEEK 1 PT Short Term Goal 1 (Week 1): Pt will perform supine<>sit with supervision PT Short Term Goal 2 (Week 1): Pt will perform sit<>stands using LRAD with CGA PT Short Term Goal 3 (Week 1): Pt will perform stand pivot transfers using LRAD with CGA PT Short Term  Goal 4 (Week 1): Pt will ambulate at least 1058fusing LRAD with CGA  Recommendations for other services: Neuropsych  Skilled Therapeutic Intervention Evaluation completed (see details above) with patient education regarding purpose of PT evaluation, PT POC and goals, therapy schedule,  weekly team meetings, and other CIR information including safety plan and fall risk safety. Patient performed functional mobility tasks with above and below assistance levels. Therapist providing cuing throughout for sequencing and technique to increase pt independence. During gait training pt noted to have shuffled steps lacking full B LE foot clearance and decreased B LE step lengths (R step length more impaired) with guarded trunk and UE - pt reports she shuffles her feet baseline. Pt agreeable to performing stair navigation though reports rarely encounters stairs - ascended reciprocally but cuing for step-to pattern on descent leading with R LE due to hx of R Achille's tendon pain. Pt's daughter later reports pt has 1 step up/down inside pt's house to get from living room area to bed/bath with a grab bar on the wall. Gait training ~100f over mulch with increased instability noted but able to maintain balance with heavier min assist. At end of session pt reporting significant fatigue - left supine in bed with needs in reach and bed alarm on.  Mobility Bed Mobility Bed Mobility: Supine to Sit;Sit to Supine Supine to Sit: Minimal Assistance - Patient > 75% Sit to Supine: Minimal Assistance - Patient > 75% Transfers Transfers: Sit to Stand;Stand to Sit;Stand Pivot Transfers Sit to Stand: Minimal Assistance - Patient > 75% Stand to Sit: Minimal Assistance - Patient > 75% Stand Pivot Transfers: Minimal Assistance - Patient > 75% Stand Pivot Transfer Details: Tactile cues for initiation;Tactile cues for sequencing;Tactile cues for weight shifting;Tactile cues for posture;Verbal cues for sequencing;Verbal cues for  technique;Verbal cues for precautions/safety Transfer (Assistive device): 1 person hand held assist Locomotion  Gait Ambulation: Yes Gait Assistance: Minimal Assistance - Patient > 75% Gait Distance (Feet): 72 Feet Assistive device: 1 person hand held assist Gait Assistance Details: Tactile cues for sequencing;Tactile cues for weight shifting;Verbal cues for sequencing;Verbal cues for technique;Verbal cues for gait pattern Gait Gait: Yes Gait Pattern: Impaired Gait Pattern: Decreased step length - right;Decreased step length - left;Decreased stride length;Decreased hip/knee flexion - right;Decreased hip/knee flexion - left;Shuffle;Decreased trunk rotation;Poor foot clearance - right;Poor foot clearance - left Gait velocity: decreased Stairs / Additional Locomotion Stairs: Yes Stairs Assistance: Minimal Assistance - Patient > 75% Stair Management Technique: Two rails Number of Stairs: 4 Height of Stairs: 6 Ramp: Minimal Assistance - Patient >75% Curb: Moderate Assistance - Patient 50 - 74% Wheelchair Mobility Wheelchair Mobility: No   Discharge Criteria: Patient will be discharged from PT if patient refuses treatment 3 consecutive times without medical reason, if treatment goals not met, if there is a change in medical status, if patient makes no progress towards goals or if patient is discharged from hospital.  The above assessment, treatment plan, treatment alternatives and goals were discussed and mutually agreed upon: by patient and by family  CTawana Scale, PT, DPT, CSRS  11/24/2019, 12:54 PM

## 2019-11-24 NOTE — Progress Notes (Signed)
Physical Therapy Session Note  Patient Details  Name: [name] MRN: [id number] Date of Birth: 1931/02/15  Today's Date: 11/24/2019 PT Individual Time: 1610-1700 PT Individual Time Calculation (min): 50 min   Short Term Goals: Week 1:  PT Short Term Goal 1 (Week 1): Pt will perform supine<>sit with supervision PT Short Term Goal 2 (Week 1): Pt will perform sit<>stands using LRAD with CGA PT Short Term Goal 3 (Week 1): Pt will perform stand pivot transfers using LRAD with CGA PT Short Term Goal 4 (Week 1): Pt will ambulate at least 136ft using LRAD with CGA  Skilled Therapeutic Interventions/Progress Updates:    Pt received asleep, supine in bed with her daughter, [name], present - pt easily aroused and agreeable to therapy session. Supine>sitting R EOB, HOB flat but using bedrail, with min assist for trunk upright. L stand pivot EOB>w/c, HHA, with min assist for balance. Pt with increased fatigue during session reporting she is "beyond exhausted" but despite this motivated to participate in session.  Transported to/from gym in w/c for time management and energy conservation. Patient participated in [facility] and demonstrates significantly increased fall risk as noted by score of 15/56.  (<36= high risk for falls, close to 100%; 37-45 significant >80%; 46-51 moderate >50%; 52-55 lower >25%). Pt noted to have more pronounced L trunk lean during this assessment and overall increased postural sway - pt with difficulty performing any task involving narrow BOS, turning, or stepping - details below. Pt required frequent seated rest breaks during assessment due to fatigue. Transported back to room and reports need to use bathroom. Gait ~44ft x2 in/out bathroom using HHA with min assist for balance. Sit<>stand to/from toilet with min assist - noted to be incontinent of bladder (wearing pad, which is baseline). Standing hand hygiene at sink with CGA for steadying. Sit>supine in bed with CGA for  trunk descent. Pt left supine in bed with HOB elevated, needs in reach, bed alarm on, and her daughter present.  Therapy Documentation Precautions:  Precautions Precautions: Fall Precaution Comments: watch BP, dizziness Restrictions Weight Bearing Restrictions: No  Pain: No reports of pain throughout session.  Balance: Standardized Balance Assessment Standardized Balance Assessment: Berg Balance Test Berg Balance Test Sit to Stand: Needs minimal aid to stand or to stabilize Standing Unsupported: Able to stand 2 minutes with supervision (L lateral lean with increased sway) Sitting with Back Unsupported but Feet Supported on Floor or Stool: Able to sit 2 minutes under supervision Stand to Sit: Uses backs of legs against chair to control descent Transfers: Needs one person to assist Standing Unsupported with Eyes Closed: Able to stand 10 seconds with supervision Standing Ubsupported with Feet Together: Needs help to attain position but able to stand for 30 seconds with feet together From Standing, Reach Forward with Outstretched Arm: Reaches forward but needs supervision From Standing Position, Pick up Object from Floor: Unable to try/needs assist to keep balance From Standing Position, Turn to Look Behind Over each Shoulder: Needs assist to keep from losing balance and falling Turn 360 Degrees: Needs assistance while turning Standing Unsupported, Alternately Place Feet on Step/Stool: Needs assistance to keep from falling or unable to try Standing Unsupported, One Foot in Front: Loses balance while stepping or standing Standing on One Leg: Unable to try or needs assist to prevent fall Total Score: 15   Therapy/Group: Individual Therapy  Tawana Scale , PT, DPT, CSRS  11/24/2019, 5:56 PM

## 2019-11-24 NOTE — Progress Notes (Signed)
   11/24/19 1435  Clinical Encounter Type  Visited With Patient and family together  Visit Type Spiritual support  Referral From Nurse  Consult/Referral To Chaplain  Chaplain responded. The patient's sister at bedside. During conversation AD was mentioned. I provided education and returned with the document. Patient was not in her room, left document on over the bed table. This note was prepared by Jeanine Luz, M.Div..  For questions please contact by phone [phone].

## 2019-11-24 NOTE — Plan of Care (Signed)
  Problem: Consults Goal: RH STROKE PATIENT EDUCATION Description: See Patient Education module for education specifics  Outcome: Progressing   Problem: RH BLADDER ELIMINATION Goal: RH STG MANAGE BLADDER WITH ASSISTANCE Description: STG Manage Bladder With mod I Assistance Outcome: Progressing   Problem: RH SKIN INTEGRITY Goal: RH STG MAINTAIN SKIN INTEGRITY WITH ASSISTANCE Description: STG Maintain Skin Integrity With mod I Assistance. Outcome: Progressing   Problem: RH PAIN MANAGEMENT Goal: RH STG PAIN MANAGED AT OR BELOW PT'S PAIN GOAL Description: Pain level less than 4 on scale of 0-10 Outcome: Progressing   Problem: RH KNOWLEDGE DEFICIT Goal: RH STG INCREASE KNOWLEDGE OF HYPERTENSION Description: Pt will be able to demonstrate understanding of medication regimen, dietary and lifestyle modifications to better control blood pressure and prevent further stroke with mod I assist using handouts and booklet provided. Outcome: Progressing Goal: RH STG INCREASE KNOWLEDGE OF STROKE PROPHYLAXIS Description: Pt will be able to demonstrate understanding of medication regimen, dietary and lifestyle modifications to better control blood pressure and prevent further stroke with mod I assist using handouts and booklet provided. Outcome: Progressing

## 2019-11-24 NOTE — Evaluation (Signed)
Occupational Therapy Assessment and Plan  Patient Details  Name: [name] MRN: [id number] Date of Birth: 02/09/1931  OT Diagnosis: abnormal posture, altered mental status, cognitive deficits and muscle weakness (generalized) Rehab Potential:   ELOS: 10-14   Today's Date: 11/24/2019 OT Individual Time: 0017-4944 OT Individual Time Calculation (min): 75 min     Hospital Problem: Principal Problem:   Acute ischemic right PCA stroke ([place]) s/p tPA and mechanical thrombectomy, d/t AF not on [facility]   Past Medical History:  Past Medical History:  Diagnosis Date  . Atrial fibrillation ([place])    Prior treatment with amiodarone, held sinus rhythm and drug stopped  . Atrial flutter ([place])    Status post ablation prior to  2009  . Chest pain    Nuclear, 2004, no scar or ischemia, EF 77%  . Ejection fraction    EF 70%, nuclear,  2004  //     . Fall    July, 2014  . H/O bladder repair surgery   . Right Achilles tendinitis   . Sciatica    Past Surgical History:  Past Surgical History:  Procedure Laterality Date  . IR CT HEAD LTD  11/14/2019  . IR PERCUTANEOUS ART THROMBECTOMY/INFUSION INTRACRANIAL INC DIAG ANGIO  11/14/2019  . IR US GUIDE VASC ACCESS RIGHT  11/14/2019  . no surgical hx    . RADIOLOGY WITH ANESTHESIA N/A 11/13/2019   Procedure: IR WITH ANESTHESIA;  Surgeon: Luanne Bras, MD;  Location: [place];  Service: Radiology;  Laterality: N/A;    Assessment & Plan Clinical Impression  86 yo with acute right thalamic infarct s/p thrombectomy of Rt PCA 10/20 with revascularization, extubated 10/20. s/p image-guided right femoral thrombin injection on 10/27 to address R femoral pseudoaneurysm. PMHx:Afib, SVT  Patient currently requires mod with basic self-care skills secondary to muscle weakness, decreased cardiorespiratoy endurance, impaired timing and sequencing and decreased coordination, decreased attention, decreased awareness, decreased problem solving, decreased safety  awareness and decreased memory and decreased sitting balance, decreased standing balance, decreased postural control, hemiplegia and decreased balance strategies.  Prior to hospitalization, patient could complete BADL with independent .  Patient will benefit from skilled intervention to increase independence with basic self-care skills prior to discharge home with care partner.  Anticipate patient will require 24 hour supervision and follow up home health.  OT - End of Session Endurance Deficit: Yes Endurance Deficit Description: reporting feeling weak at beginning of EVAL OT Assessment Rehab Potential (ACUTE ONLY): Good OT Barriers to Discharge: Decreased caregiver support OT Barriers to Discharge Comments: unsure how much help husband will be ar DC per pt "my role is to make sure he eats" OT Patient demonstrates impairments in the following area(s): Balance;Cognition;Endurance;Motor;Pain;Perception;Safety OT Basic ADL's Functional Problem(s): Grooming;Bathing;Dressing;Toileting OT Transfers Functional Problem(s): Toilet;Tub/Shower OT Additional Impairment(s): Fuctional Use of Upper Extremity OT Plan OT Intensity: Minimum of 1-2 x/day, 45 to 90 minutes OT Frequency: 5 out of 7 days OT Duration/Estimated Length of Stay: 10-14 OT Treatment/Interventions: Balance/vestibular training;Discharge planning;Pain management;Self Care/advanced ADL retraining;Therapeutic Activities;UE/LE Coordination activities;Visual/perceptual remediation/compensation;Therapeutic Exercise;Skin care/wound managment;Patient/family education;Disease mangement/prevention;Cognitive remediation/compensation;Community Radiographer, therapeutic;Functional mobility training;Neuromuscular re-education;Psychosocial support;Splinting/orthotics;UE/LE Strength taining/ROM;Wheelchair propulsion/positioning OT Self Feeding Anticipated Outcome(s): S OT Basic Self-Care Anticipated Outcome(s): S OT Toileting  Anticipated Outcome(s): S OT Bathroom Transfers Anticipated Outcome(s): S OT Recommendation Patient destination: Home Follow Up Recommendations: Home health OT Equipment Recommended: To be determined;Tub/shower seat;Tub/shower bench   OT Evaluation Precautions/Restrictions  Precautions Precautions: Fall Precaution Comments: watch BP, dizziness General Chart Reviewed: Yes Family/Caregiver Present:  No Vital Signs Therapy Vitals BP: 133/61 Patient Position (if appropriate): Lying Pain Pain Assessment Pain Scale: 0-10 Pain Score: 5  Pain Type: Acute pain Pain Location: Groin Pain Orientation: Right Pain Descriptors / Indicators: Tender Pain Onset: Gradual Pain Intervention(s): Medication (See eMAR) Home Living/Prior Functioning Home Living Living Arrangements: Spouse/significant other Available Help at Discharge: Family, Available 24 hours/day Type of Home: House Home Access: Stairs to enter Technical brewer of Steps: 1 Entrance Stairs-Rails: Right Home Layout: One level Bathroom Shower/Tub: Tub only, Multimedia programmer: Programmer, systems: Yes  Lives With: Spouse Prior Function Vocation: Volunteer work Comments: reports independent and driving, managing her medication (takes care of her husbands meds too) Vision Baseline Vision/History: No visual deficits Patient Visual Report: No change from baseline;Other (comment) (had cataract surgery) Vision Assessment?: Yes Eye Alignment: Within Functional Limits Ocular Range of Motion: Within Functional Limits Alignment/Gaze Preference: Within Defined Limits Visual Fields: No apparent deficits Perception  Perception: Within Functional Limits Praxis Praxis: Intact Cognition Overall Cognitive Status: Impaired/Different from baseline Arousal/Alertness: Awake/alert Orientation Level: Person;Place;Situation Person: Oriented Place: Oriented Situation: Oriented Year: 2021 Month:  October Day of Week: Correct Memory: Impaired Memory Impairment: Retrieval deficit;Decreased recall of new information;Decreased short term memory Decreased Short Term Memory: Verbal basic;Functional basic Immediate Memory Recall: Sock;Blue;Bed Memory Recall Sock: Without Cue Memory Recall Blue: Without Cue Memory Recall Bed: Without Cue Attention: Sustained Sustained Attention: Impaired Awareness: Impaired Problem Solving: Impaired Safety/Judgment: Impaired Sensation Sensation Light Touch: Impaired by gross assessment (mild L inattention when sensory stimulus on BLE) Coordination Gross Motor Movements are Fluid and Coordinated: No (YEs UE; no LE- hematoma pain limiting movement.) Fine Motor Movements are Fluid and Coordinated: No Finger Nose Finger Test: mild dysmetria in LUE Motor  Motor Motor: Hemiplegia  Trunk/Postural Assessment  Cervical Assessment Cervical Assessment:  (head forward) Thoracic Assessment Thoracic Assessment:  (rounded shoudlers) Lumbar Assessment Lumbar Assessment:  (post pelvic tilt) Postural Control Postural Control: Deficits on evaluation (delayed)  Balance Balance Balance Assessed: Yes Dynamic Sitting Balance Dynamic Sitting - Balance Support: No upper extremity supported Dynamic Sitting - Level of Assistance: 5: Stand by assistance Static Standing Balance Static Standing - Balance Support: No upper extremity supported Static Standing - Level of Assistance: 4: Min assist Dynamic Standing Balance Dynamic Standing - Balance Support: No upper extremity supported Dynamic Standing - Level of Assistance: 3: Mod assist Extremity/Trunk Assessment RUE Assessment RUE Assessment: Within Functional Limits LUE Assessment LUE Assessment: Exceptions to [facility] General Strength Comments: generalized weakness LUE Body System: Neuro Brunstrum levels for arm and hand: Arm;Hand Brunstrum level for arm: Stage V Relative Independence from Synergy Brunstrum level  for hand: Stage VI Isolated joint movements  Care Tool Care Tool Self Care Eating        Oral Care    Oral Care Assist Level: Supervision/Verbal cueing    Bathing   Body parts bathed by patient: Right arm;Left arm;Chest;Abdomen;Front perineal area;Buttocks;Right upper leg;Left upper leg;Right lower leg;Left lower leg;Face     Assist Level: Supervision/Verbal cueing    Upper Body Dressing(including orthotics)   What is the patient wearing?: Pull over shirt;Bra   Assist Level: Supervision/Verbal cueing    Lower Body Dressing (excluding footwear)   What is the patient wearing?: Underwear/pull up;Pants Assist for lower body dressing: Minimal Assistance - Patient > 75%    Putting on/Taking off footwear   What is the patient wearing?: Non-skid slipper socks Assist for footwear: Moderate Assistance - Patient 50 - 74%  Care Tool Toileting Toileting activity   Assist for toileting: Maximal Assistance - Patient 25 - 49% (pt req bed level cleansing d/t labile BP on toilet)     Care Tool Bed Mobility Roll left and right activity        Sit to lying activity        Lying to sitting edge of bed activity         Care Tool Transfers Sit to stand transfer   Sit to stand assist level: Minimal Assistance - Patient > 75%    Chair/bed transfer   Chair/bed transfer assist level: Minimal Assistance - Patient > 75%     Toilet transfer   Assist Level: Maximal Assistance - Patient 24 - 49% (back to bed d/t labile BP on toilet after voicing bowel)     Care Tool Cognition Expression of Ideas and Wants     Understanding Verbal and Non-Verbal Content     Memory/Recall Ability *first 3 days only      Refer to Care Plan for Long Term Goals  SHORT TERM GOAL WEEK 1 OT Short Term Goal 1 (Week 1): Pt will tolerate 1 hour session wiht no changes in vitals to demo improved activity tolerance/medical stability OT Short Term Goal 2 (Week 1): Pt Will transfer to toilet wiht CGA OT  Short Term Goal 3 (Week 1): Pt will thread BLE into LB clothing wiht S OT Short Term Goal 4 (Week 1): Pt will maintain dynamic sitting balance at EOB/EOM wiht S OT Short Term Goal 5 (Week 1): Pt will compelte peri hygeine with S  Recommendations for other services: Therapeutic Recreation  Pet therapy, Stress management and Outing/community reintegration   Skilled Therapeutic Intervention 1:1. Pt received in bed agreeable to OT after edu of role/purpose, CIR, ELOS, and POC. OT retrieves w/c and BSC while RN delivers pain medication for mild hematoma pain. Pt initially transfers sup>EOB>w/c>toilet>w/c>EOB>BSC with MIN A consistently with vitals stable throughout 130s/60-70s; however once back on BSC bearing down to void bowel pt reporting "feeeling REALLY dizzy" and is pale. Pt requires MAX A to transfer back to bed and placed in trendelenburg position. Vitals reassessed at 131/70 after 2 min. Pt completes dressing at EOB/bed level as stated below with increased A d/t fatigue. Exited session with pt seated in bed, exit alarm on and call light in reach   ADL ADL Eating: Supervision/safety Where Assessed-Eating: Bed level Grooming: Supervision/safety Where Assessed-Grooming: Edge of bed Upper Body Bathing: Supervision/safety  Where Assessed-Upper Body Bathing: Edge of bed Lower Body Bathing: max Where Assessed-Lower Body Bathing: bed level Upper Body Dressing: MIN A (sitting balance no feet supported- L lean) Where Assessed-Upper Body Dressing: Edge of bed Lower Body Dressing: MAX A Where Assessed-Lower Body Dressing: Edge of bed/bed level for bridging hips d/t pt feeling weak Toileting: Moderate assistance Where Assessed-Toileting: Glass blower/designer: Psychiatric nurse Method: Arts development officer: Bedside commode;Raised toilet seat;Grab bars Mobility  Bed Mobility Bed Mobility: Supine to Sit Supine to Sit: Moderate Assistance - Patient  50-74% Transfers Sit to Stand: Minimal Assistance - Patient > 75% Stand to Sit: Minimal Assistance - Patient > 75%   Discharge Criteria: Patient will be discharged from OT if patient refuses treatment 3 consecutive times without medical reason, if treatment goals not met, if there is a change in medical status, if patient makes no progress towards goals or if patient is discharged from hospital.  The above assessment, treatment plan, treatment alternatives and goals  were discussed and mutually agreed upon: by patient  Tonny Branch 11/24/2019, 9:18 AM

## 2019-11-25 DIAGNOSIS — I482 Chronic atrial fibrillation, unspecified: Secondary | ICD-10-CM | POA: Diagnosis not present

## 2019-11-25 DIAGNOSIS — I63531 Cerebral infarction due to unspecified occlusion or stenosis of right posterior cerebral artery: Secondary | ICD-10-CM | POA: Diagnosis not present

## 2019-11-25 DIAGNOSIS — R350 Frequency of micturition: Secondary | ICD-10-CM | POA: Diagnosis not present

## 2019-11-25 NOTE — Plan of Care (Signed)
  Problem: Consults Goal: RH STROKE PATIENT EDUCATION Description: See Patient Education module for education specifics  Outcome: Progressing   Problem: RH BLADDER ELIMINATION Goal: RH STG MANAGE BLADDER WITH ASSISTANCE Description: STG Manage Bladder With mod I Assistance Outcome: Progressing   Problem: RH SKIN INTEGRITY Goal: RH STG MAINTAIN SKIN INTEGRITY WITH ASSISTANCE Description: STG Maintain Skin Integrity With mod I Assistance. Outcome: Progressing   Problem: RH PAIN MANAGEMENT Goal: RH STG PAIN MANAGED AT OR BELOW PT'S PAIN GOAL Description: Pain level less than 4 on scale of 0-10 Outcome: Progressing   Problem: RH KNOWLEDGE DEFICIT Goal: RH STG INCREASE KNOWLEDGE OF HYPERTENSION Description: Pt will be able to demonstrate understanding of medication regimen, dietary and lifestyle modifications to better control blood pressure and prevent further stroke with mod I assist using handouts and booklet provided. Outcome: Progressing Goal: RH STG INCREASE KNOWLEDGE OF STROKE PROPHYLAXIS Description: Pt will be able to demonstrate understanding of medication regimen, dietary and lifestyle modifications to better control blood pressure and prevent further stroke with mod I assist using handouts and booklet provided. Outcome: Progressing

## 2019-11-25 NOTE — Progress Notes (Signed)
Slept good. Continent on BSC at HS. Patient asleep when returned to room, will place purewick when patient wakes up. At 0100, patient incontinent of urine, assisted to [facility] Purewick applied. Will place earlier tonight. Denies pain. Patrici Ranks A

## 2019-11-25 NOTE — Progress Notes (Signed)
[facility] PHYSICAL MEDICINE & REHABILITATION PROGRESS NOTE   Subjective/Complaints: Had a much better night with purewick. Smiling, rested this morning. No complaints  ROS: Patient denies fever, rash, sore throat, blurred vision, nausea, vomiting, diarrhea, cough, shortness of breath or chest pain, joint or back pain, headache, or mood change.    Objective:   No results found. Recent Labs    11/24/19 0449  WBC 7.6  HGB 10.2*  HCT 32.0*  PLT 373   No results for input(s): NA, K, CL, CO2, GLUCOSE, BUN, CREATININE, CALCIUM in the last 72 hours.  Intake/Output Summary (Last 24 hours) at 11/25/2019 0951 Last data filed at 11/25/2019 0834 Gross per 24 hour  Intake 924 ml  Output 300 ml  Net 624 ml        Physical Exam: Vital Signs Blood pressure (!) 154/69, pulse 63, temperature 98.5 F (36.9 C), resp. rate 16, height 5' (1.524 m), weight 62.8 kg, SpO2 95 %.  Constitutional: No distress . Vital signs reviewed. HEENT: EOMI, oral membranes moist Neck: supple Cardiovascular: RRR without murmur. No JVD    Respiratory/Chest: CTA Bilaterally without wheezes or rales. Normal effort    GI/Abdomen: BS +, non-tender, non-distended Ext: no clubbing, cyanosis, or edema Psych: pleasant and cooperative Skin: Clean and intact without signs of breakdown. Right thigh hematoma still present Neuro: Pt is cognitively appropriate with normal insight, memory, and awareness. Cranial nerves 2-12 are intact. Sensory exam is normal. Reflexes are 2+ in all 4's. Fine motor coordination is intact. No tremors. Motor function is grossly 5/5 UE. LLE 5-/5. RLE 4- to 4/5---pain inhibition RLE Musculoskeletal: Full ROM, No pain with AROM or PROM in the neck, trunk, or extremities. Posture appropriate       Assessment/Plan: 1. Functional deficits secondary to right cerebellar, frontal and thalamic infarcts with [facility] which require 3+ hours per day of interdisciplinary therapy in a comprehensive inpatient  rehab setting.  Physiatrist is providing close team supervision and 24 hour management of active medical problems listed below.  Physiatrist and rehab team continue to assess barriers to discharge/monitor patient progress toward functional and medical goals  Care Tool:  Bathing    Body parts bathed by patient: Right arm, Left arm, Chest, Abdomen, Front perineal area, Buttocks, Right upper leg, Left upper leg, Right lower leg, Left lower leg, Face         Bathing assist Assist Level: Supervision/Verbal cueing     Upper Body Dressing/Undressing Upper body dressing   What is the patient wearing?: Pull over shirt, Bra    Upper body assist Assist Level: Supervision/Verbal cueing    Lower Body Dressing/Undressing Lower body dressing      What is the patient wearing?: Incontinence brief     Lower body assist Assist for lower body dressing: Moderate Assistance - Patient 50 - 74%     Toileting Toileting    Toileting assist Assist for toileting: Moderate Assistance - Patient 50 - 74%     Transfers Chair/bed transfer  Transfers assist     Chair/bed transfer assist level: Minimal Assistance - Patient > 75%     Locomotion Ambulation   Ambulation assist      Assist level: Minimal Assistance - Patient > 75% Assistive device: Hand held assist Max distance: 30ft   Walk 10 feet activity   Assist     Assist level: Minimal Assistance - Patient > 75% Assistive device: Hand held assist   Walk 50 feet activity   Assist    Assist  level: Minimal Assistance - Patient > 75% Assistive device: Hand held assist    Walk 150 feet activity   Assist Walk 150 feet activity did not occur: Safety/medical concerns         Walk 10 feet on uneven surface  activity   Assist     Assist level: Minimal Assistance - Patient > 75% (ramp) Assistive device: Hand held assist, Other (comment) (railing)   Wheelchair     Assist Will patient use wheelchair at  discharge?: No             Wheelchair 50 feet with 2 turns activity    Assist            Wheelchair 150 feet activity     Assist          Blood pressure (!) 154/69, pulse 63, temperature 98.5 F (36.9 C), resp. rate 16, height 5' (1.524 m), weight 62.8 kg, SpO2 95 %.  Medical Problem List and Plan: 1.  Deficits with mobility, endurance, self-care secondary to acute infarct in right cerebellum, right frontal cortex, right thalamus with mild SAH.             -patient may shower             -ELOS/Goals: 7-12 days/mod I/supervision             --Continue CIR therapies including PT, OT, and SLP  2.  Antithrombotics: -DVT/anticoagulation:  Pharmaceutical: Other (comment)--Elquis             -antiplatelet therapy: N/A 3. Right groin pain/Pain Management: Tylenol prn 4. Mood: LCSW to follow for evaluation and support.              -antipsychotic agents: N/A 5. Neuropsych: This patient is capable of making decisions on her own behalf. 6. Skin/Wound Care: local care to right groin. Routine pressure relief measures.  7. Fluids/Electrolytes/Nutrition: Encourage intake.  CMP ordered. 8. DZH:GDJMEQA BP tid             10/31 bp's controlled 9. A fib: HR controlled off metoprolol.  Eliquis resumed 10/28.              HR in 60's today. 10. Right groin pseudoaneurysm: Resolved--continue to monitor daily.  11. Acute blood loss anemia:              CBC ordered. 12.  Chronic UTIs/urinary frequency: Resumed suppressive therapy.  -PVR scan yesterday was 76cc  -purewick catheter for HS frequency very helpful 13. Slow transit constipation: managed with colace and miralax.              Adjust all meds as necessary   -bm 10/30  LOS: 2 days A FACE TO [place] 11/25/2019, 9:51 AM

## 2019-11-26 ENCOUNTER — Inpatient Hospital Stay (HOSPITAL_COMMUNITY): Payer: Medicare Other | Admitting: Occupational Therapy

## 2019-11-26 ENCOUNTER — Inpatient Hospital Stay (HOSPITAL_COMMUNITY): Payer: Medicare Other

## 2019-11-26 ENCOUNTER — Encounter (HOSPITAL_COMMUNITY): Payer: Self-pay | Admitting: Physical Medicine & Rehabilitation

## 2019-11-26 DIAGNOSIS — I63531 Cerebral infarction due to unspecified occlusion or stenosis of right posterior cerebral artery: Secondary | ICD-10-CM | POA: Diagnosis not present

## 2019-11-26 LAB — COMPREHENSIVE METABOLIC PANEL
ALT: 26 U/L (ref 0–44)
AST: 30 U/L (ref 15–41)
Albumin: 2.9 g/dL — ABNORMAL LOW (ref 3.5–5.0)
Alkaline Phosphatase: 60 U/L (ref 38–126)
Anion gap: 11 (ref 5–15)
BUN: 12 mg/dL (ref 8–23)
CO2: 23 mmol/L (ref 22–32)
Calcium: 8.5 mg/dL — ABNORMAL LOW (ref 8.9–10.3)
Chloride: 105 mmol/L (ref 98–111)
Creatinine, Ser: 0.79 mg/dL (ref 0.44–1.00)
GFR, Estimated: >60 mL/min (ref >60)
Glucose, Bld: 146 mg/dL — ABNORMAL HIGH (ref 70–99)
Potassium: 3.6 mmol/L (ref 3.5–5.1)
Sodium: 139 mmol/L (ref 135–145)
Total Bilirubin: 1.4 mg/dL — ABNORMAL HIGH (ref 0.3–1.2)
Total Protein: 6.1 g/dL — ABNORMAL LOW (ref 6.5–8.1)

## 2019-11-26 NOTE — Progress Notes (Signed)
Occupational Therapy Session Note  Patient Details  Name: [name] MRN: [id number] Date of Birth: 08-07-1931  Today's Date: 11/26/2019 OT Individual Time: 1425-1525 OT Individual Time Calculation (min): 60 min    Short Term Goals: Week 1:  OT Short Term Goal 1 (Week 1): Pt will tolerate 1 hour session wiht no changes in vitals to demo improved activity tolerance/medical stability OT Short Term Goal 2 (Week 1): Pt Will transfer to toilet wiht CGA OT Short Term Goal 3 (Week 1): Pt will thread BLE into LB clothing wiht S OT Short Term Goal 4 (Week 1): Pt will maintain dynamic sitting balance at EOB/EOM wiht S OT Short Term Goal 5 (Week 1): Pt will compelte peri hygeine with S  Skilled Therapeutic Interventions/Progress Updates:    Patient in bed, alert - she notes that she is tired but agrees to participate in therapy session.  She notes tenderness right thigh that is under control unless touched/bumped.  Supine to sit with CS and increased time.  Seated balance with CS.  Sit to stand and short distance ambulation using RW in room to/from bed, w/c with CGA - min cues for walker placement and technique with turning.  She was able to tolerate standing at sink to complete oral care CS.  LB dressing CGA, UB dressing CS.  Hair washing completed with shampoo tray at sink.  Ambulation with RW on unit CGA, min A to manage RW on turns.  No LOB or posterior lean noted in stance this session.  She returned to bed at close of session.  Sit to supine with CS.  Good recall and effort t/o session.  Bed alarm set and call bell in reach.    Therapy Documentation Precautions:  Precautions Precautions: Fall Precaution Comments: watch BP, dizziness Restrictions Weight Bearing Restrictions: No   Therapy/Group: Individual Therapy  Carlos Levering 11/26/2019, 7:50 AM

## 2019-11-26 NOTE — Progress Notes (Signed)
Inpatient Rehabilitation  Patient information reviewed and entered into eRehab system by Shyler Hamill M. Jesusita Jocelyn, M.A., CCC/SLP, PPS Coordinator.  Information including medical coding, functional ability and quality indicators will be reviewed and updated through discharge.    

## 2019-11-26 NOTE — Plan of Care (Signed)
  Problem: RH Problem Solving Goal: LTG Patient will demonstrate problem solving for (SLP) Description: LTG:  Patient will demonstrate problem solving for basic/complex daily situations with cues  (SLP) Flowsheets (Taken 11/26/2019 1638) LTG: Patient will demonstrate problem solving for (SLP): (basic to mildly complex) -- LTG Patient will demonstrate problem solving for: Supervision   Problem: RH Attention Goal: LTG Patient will demonstrate this level of attention during functional activites (SLP) Description: LTG:  Patient will will demonstrate this level of attention during functional activites (SLP) Flowsheets (Taken 11/26/2019 1638) Patient will demonstrate during cognitive/linguistic activities the attention type of: Sustained Patient will demonstrate this level of attention during cognitive/linguistic activities in: Controlled LTG: Patient will demonstrate this level of attention during cognitive/linguistic activities with assistance of (SLP): Supervision Number of minutes patient will demonstrate attention during cognitive/linguistic activities: 30   Problem: RH Awareness Goal: LTG: Patient will demonstrate awareness during functional activites type of (SLP) Description: LTG: Patient will demonstrate awareness during functional activites type of (SLP) Flowsheets (Taken 11/26/2019 1638) Patient will demonstrate during cognitive/linguistic activities awareness type of: Emergent LTG: Patient will demonstrate awareness during cognitive/linguistic activities with assistance of (SLP): Minimal Assistance - Patient > 75%   Problem: RH Memory Goal: LTG Patient will demonstrate ability for day to day (SLP) Description: LTG:   Patient will demonstrate ability for day to day recall/carryover during cognitive/linguistic activities with assist  (SLP) Outcome: Not Applicable   Problem: RH Memory Goal: LTG Patient will demonstrate ability for day to day (SLP) Description: LTG:   Patient will  demonstrate ability for day to day recall/carryover during cognitive/linguistic activities with assist  (SLP) Outcome: Not Applicable   Problem: RH Memory Goal: LTG Patient will use memory compensatory aids to (SLP) Description: LTG:  Patient will use memory compensatory aids to recall biographical/new, daily complex information with cues (SLP) Flowsheets (Taken 11/26/2019 1649) LTG: Patient will use memory compensatory aids to (SLP): Modified Independent

## 2019-11-26 NOTE — Evaluation (Signed)
Speech Language Pathology Assessment and Plan  Patient Details  Name: [name] MRN: [id number] Date of Birth: 1931-11-04  SLP Diagnosis: Cognitive Impairments  Rehab Potential: Good ELOS: 10-14 days    Today's Date: 11/26/2019 SLP Individual Time: 0901-1000 SLP Individual Time Calculation (min): 43 min   Hospital Problem: Principal Problem:   Acute ischemic right PCA stroke ([place]) s/p tPA and mechanical thrombectomy, d/t AF not on [facility]  Past Medical History:  Past Medical History:  Diagnosis Date  . Atrial fibrillation ([place])    Prior treatment with amiodarone, held sinus rhythm and drug stopped  . Atrial flutter ([place])    Status post ablation prior to  2009  . Chest pain    Nuclear, 2004, no scar or ischemia, EF 77%  . Ejection fraction    EF 70%, nuclear,  2004  //     . Fall    July, 2014  . H/O bladder repair surgery   . Right Achilles tendinitis   . Sciatica    Past Surgical History:  Past Surgical History:  Procedure Laterality Date  . IR CT HEAD LTD  11/14/2019  . IR PERCUTANEOUS ART THROMBECTOMY/INFUSION INTRACRANIAL INC DIAG ANGIO  11/14/2019  . IR US GUIDE VASC ACCESS RIGHT  11/14/2019  . no surgical hx    . RADIOLOGY WITH ANESTHESIA N/A 11/13/2019   Procedure: IR WITH ANESTHESIA;  Surgeon: Luanne Bras, MD;  Location: [facility];  Service: Radiology;  Laterality: N/A;    Assessment / Plan / Recommendation Clinical Impression Patient is a 86 y.o. year old female with history of A fib/flutter s/p flutter--off AC due to NSR, recent treatment for PNA who was admitted on 11/13/2019 with left hemiparesis and right gaze preference. CTA head unremarkable for acute intracranial process and CTA showed proximal Right PCA occlusion with emphysema and layering left pleural effusion c/w PNA. She underwent cerebral angio with revascularization of R-PCA by Dr. Earleen Newport. MRI/MRA brain done revealing right thalamic infarct with mild SAH in interpeduncular cistern and small  areas of acute infarct in right cerebellum and right frontal cortex. Echocardiogram with EF of 60-65% with severe asymmetric basal septal hypertrophy of LV and mild aortic sclerosis with trivial regurgitation. She completed 7 day course of Augmentin for PNA on 10/26.   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 She underwent ultrasound guided trhombin injection of R-CFA with good results on 10/22. Eliquis added on 10/24. She continued to have progressive drop in H/H 13.3-->10.5-->8.8 with dizziness and nausea and reported worsening of right groin pain on 10/29. CTA abdomen/pelvis showed recurrent pseudoaneurysm. Dr. Scot Dock consulted for input and recommended repeat thrombin injection which was performed by IR on 10/27. Post procedure scan ultrasound the next day showed closed pseudoaneurysm and Eliquis resumed yesterday. Therapy has been ongoing and CIR recommended due to functional decline.  Patient transferred to CIR on 11/23/2019 .  Pt presents with primary impairments in functional implications of acute CVA deficits and emergent awareness. Pt demonstrates intellectual awareness of deficits " they say I have left side weakness" and reported "some confusion." Pt demonstrated awareness of errors in clock drawing task, however not attempt to correct errors even when prompted. Pt also demonstrates deficits in basic problem solving, sustained attention in complex tasks, and short term recall, further supported by formal cognitive linguistic assessment SLUMS scoring 21 out 30 (n=>27.) Pt demonstrated moderate impairment in mildly complex problem solving on Retail buyer task. Pt presents no deficits in speech intelligibility nor expressed  concerns pertaining to swallowing function. Pt would benefit from skilled ST services in order to maximize functional independence and reduce burden of care, requiring supervision at discharge with continued skilled ST  services.   Skilled Therapeutic Interventions          Skilled ST services focused on cognitive skills. SLP facilitated administration of cognitive linguistic formal assessment and provided education of results. SLP and pt collaborated to set goals for cognitive linguistic needs during length of stay. All questions answered to satisfaction.  Pt was left in room with call bell within reach and bed alarm set. SLP recommends to continue skilled services.  SLP Assessment  Patient will need skilled [facility] during CIR admission    Recommendations  Patient destination: Home Follow up Recommendations: Home Health SLP;24 hour supervision/assistance;Outpatient SLP Equipment Recommended: None recommended by SLP    SLP Frequency 3 to 5 out of 7 days   SLP Duration  SLP Intensity  SLP Treatment/Interventions 10-14 days  Minumum of 1-2 x/day, 30 to 90 minutes  Cognitive remediation/compensation;Cueing hierarchy;Functional tasks;Medication managment;Internal/external aids;Patient/family education    Pain Pain Assessment Pain Score: 0-No pain  Prior Functioning Cognitive/Linguistic Baseline: Within functional limits Type of Home: House  Lives With: Spouse Available Help at Discharge: Available 24 hours/day;Family Vocation: Psychologist, occupational work  SLP Evaluation Cognition Overall Cognitive Status: Impaired/Different from baseline Arousal/Alertness: Awake/alert Orientation Level: Oriented X4 Attention: Sustained Sustained Attention: Impaired Memory: Impaired Memory Impairment: Retrieval deficit;Decreased recall of new information;Decreased short term memory Awareness: Impaired Awareness Impairment: Emergent impairment Problem Solving: Impaired Problem Solving Impairment: Verbal complex;Functional complex;Functional basic;Verbal basic Safety/Judgment: Impaired  Comprehension Auditory Comprehension Overall Auditory Comprehension: Appears within functional limits for  tasks assessed Expression Expression Primary Mode of Expression: Verbal Verbal Expression Overall Verbal Expression: Appears within functional limits for tasks assessed Oral Motor Oral Motor/Sensory Function Overall Oral Motor/Sensory Function: Within functional limits Motor Speech Overall Motor Speech: Appears within functional limits for tasks assessed  Care Tool Care Tool Cognition Expression of Ideas and Wants Expression of Ideas and Wants: Without difficulty (complex and basic) - expresses complex messages without difficulty and with speech that is clear and easy to understand   Understanding Verbal and Non-Verbal Content Understanding Verbal and Non-Verbal Content: Usually understands - understands most conversations, but misses some part/intent of message. Requires cues at times to understand   Memory/Recall Ability *first 3 days only          Short Term Goals: Week 1: SLP Short Term Goal 1 (Week 1): Pt will recall daily events and novel information with supervision A verbal cues for use of external/internal aids. SLP Short Term Goal 2 (Week 1): Pt will complete basic-mildly complex problem solving tasks with min A verbal cues. SLP Short Term Goal 3 (Week 1): Pt will self-monitory and self-correct errors during problem solving tasks with mod A verbal cues. SLP Short Term Goal 4 (Week 1): Pt will demonstrate sustained attention in 15 minute intervals during mildly complex problem solving tasks with supervision A verbal cues for redirection.  Refer to Care Plan for Long Term Goals  Recommendations for other services: None   Discharge Criteria: Patient will be discharged from SLP if patient refuses treatment 3 consecutive times without medical reason, if treatment goals not met, if there is a change in medical status, if patient makes no progress towards goals or if patient is discharged from hospital.  The above assessment, treatment plan, treatment alternatives and goals  were discussed and mutually agreed upon: by patient  Alizabeth Antonio  [facility] 11/26/2019, 4:52 PM

## 2019-11-26 NOTE — Progress Notes (Signed)
[place] PHYSICAL MEDICINE & REHABILITATION PROGRESS NOTE   Subjective/Complaints:  Pt awake and alert this am , talking to SLP  ROS: Patient denies CP, SOB, N/V/D    Objective:   No results found. Recent Labs    11/24/19 0449  WBC 7.6  HGB 10.2*  HCT 32.0*  PLT 373   No results for input(s): NA, K, CL, CO2, GLUCOSE, BUN, CREATININE, CALCIUM in the last 72 hours.  Intake/Output Summary (Last 24 hours) at 11/26/2019 0911 Last data filed at 11/25/2019 2300 Gross per 24 hour  Intake 462 ml  Output 175 ml  Net 287 ml        Physical Exam: Vital Signs Blood pressure 139/90, pulse 78, temperature 98.4 F (36.9 C), resp. rate 16, height 5' (1.524 m), weight 62.8 kg, SpO2 94 %.   General: No acute distress Mood and affect are appropriate Heart: Regular rate and rhythm no rubs murmurs or extra sounds Lungs: Clear to auscultation, breathing unlabored, no rales or wheezes Abdomen: Positive bowel sounds, soft nontender to palpation, nondistended Extremities: No clubbing, cyanosis, or edema Skin: No evidence of breakdown, no evidence of rash  Neuro: Pt is cognitively appropriate with normal insight, memory, and awareness. Cranial nerves 2-12 are intact. Sensory exam is normal. Reflexes are 2+ in all 4's. Fine motor coordination is intact. No tremors. Motor function is grossly 5/5 UE. LLE 5-/5. RLE 4- to 4/5---pain inhibition RLE Musculoskeletal: Full ROM, No pain with AROM or PROM in the neck, trunk, or extremities. Posture appropriate       Assessment/Plan: 1. Functional deficits secondary to right cerebellar, frontal and thalamic infarcts with [facility] which require 3+ hours per day of interdisciplinary therapy in a comprehensive inpatient rehab setting.  Physiatrist is providing close team supervision and 24 hour management of active medical problems listed below.  Physiatrist and rehab team continue to assess barriers to discharge/monitor patient progress toward  functional and medical goals  Care Tool:  Bathing    Body parts bathed by patient: Right arm, Left arm         Bathing assist Assist Level: Supervision/Verbal cueing     Upper Body Dressing/Undressing Upper body dressing   What is the patient wearing?: Pull over shirt    Upper body assist Assist Level: Minimal Assistance - Patient > 75%    Lower Body Dressing/Undressing Lower body dressing      What is the patient wearing?: Incontinence brief     Lower body assist Assist for lower body dressing: Moderate Assistance - Patient 50 - 74%     Toileting Toileting    Toileting assist Assist for toileting: Moderate Assistance - Patient 50 - 74%     Transfers Chair/bed transfer  Transfers assist     Chair/bed transfer assist level: Minimal Assistance - Patient > 75%     Locomotion Ambulation   Ambulation assist      Assist level: Minimal Assistance - Patient > 75% Assistive device: Hand held assist Max distance: 20ft   Walk 10 feet activity   Assist     Assist level: Minimal Assistance - Patient > 75% Assistive device: Hand held assist   Walk 50 feet activity   Assist    Assist level: Minimal Assistance - Patient > 75% Assistive device: Hand held assist    Walk 150 feet activity   Assist Walk 150 feet activity did not occur: Safety/medical concerns         Walk 10 feet on uneven surface  activity  Assist     Assist level: Minimal Assistance - Patient > 75% (ramp) Assistive device: Hand held assist, Other (comment) (railing)   Wheelchair     Assist Will patient use wheelchair at discharge?: No             Wheelchair 50 feet with 2 turns activity    Assist            Wheelchair 150 feet activity     Assist          Blood pressure 139/90, pulse 78, temperature 98.4 F (36.9 C), resp. rate 16, height 5' (1.524 m), weight 62.8 kg, SpO2 94 %.  Medical Problem List and Plan: 1.  Deficits with  mobility, endurance, self-care secondary to acute infarct in right cerebellum, right frontal cortex, right thalamus with mild SAH.             -patient may shower             -ELOS/Goals: 7-12 days/mod I/supervision             --Continue CIR therapies including PT, OT, and SLP  2.  Antithrombotics: -DVT/anticoagulation:  Pharmaceutical: Other (comment)--Elquis             -antiplatelet therapy: N/A 3. Right groin pain/Pain Management: Tylenol prn 4. Mood: LCSW to follow for evaluation and support.              -antipsychotic agents: N/A 5. Neuropsych: This patient is capable of making decisions on her own behalf. 6. Skin/Wound Care: local care to right groin. Routine pressure relief measures.  7. Fluids/Electrolytes/Nutrition: Encourage intake.  CMP ordered.I: 747ml 8. MGQ:QPYPPJK BP tid    Vitals:   11/25/19 1921 11/26/19 0426  BP: (!) 158/81 139/90  Pulse: 84 78  Resp: 18 16  Temp: 98.4 F (36.9 C) 98.4 F (36.9 C)  SpO2: 96% 94%   9. A fib: HR controlled off metoprolol.  Eliquis resumed 10/28.              HR in 60's today. 10. Right groin pseudoaneurysm: Resolved--continue to monitor daily.  11. Acute blood loss anemia:              CBC ordered. 12.  Chronic UTIs/urinary frequency: Resumed suppressive therapy.  -PVR scan yesterday was 76cc  -purewick catheter for HS frequency very helpful 13. Slow transit constipation: managed with colace and miralax.              Adjust all meds as necessary   -bm 10/31 medium type 5  LOS: 3 days A FACE TO FACE EVALUATION WAS PERFORMED  Charlett Blake 11/26/2019, 9:11 AM

## 2019-11-26 NOTE — IPOC Note (Signed)
Overall Plan of Care [facility]) Patient Details Name: [name] MRN: [id number] DOB: 08-07-1931  Admitting Diagnosis: Acute ischemic right PCA stroke [facility])  Hospital Problems: Principal Problem:   Acute ischemic right PCA stroke ([place]) s/p tPA and mechanical thrombectomy, d/t AF not on [facility]     Functional Problem List: Nursing Endurance, Pain, Nutrition, Motor, Medication Management  PT Balance, Motor, Safety, Behavior, Nutrition, Sensory, Edema, Pain, Skin Integrity, Endurance, Perception  OT Balance, Cognition, Endurance, Motor, Pain, Perception, Safety  SLP    TR         Basic ADL's: OT Grooming, Bathing, Dressing, Toileting     Advanced  ADL's: OT       Transfers: PT Bed Mobility, Bed to Chair, Car, Furniture, Floor  OT Toilet, Tub/Shower     Locomotion: PT Ambulation, Stairs     Additional Impairments: OT Fuctional Use of Upper Extremity  SLP        TR      Anticipated Outcomes Item Anticipated Outcome  Self Feeding S  Swallowing      Basic self-care  S  Toileting  S   Bathroom Transfers S  Bowel/Bladder  manage bowel and bladder with mod I assist  Transfers  supervision  Locomotion  supervision  Communication     Cognition     Pain  pain level less than 4 on scale of 0-10  Safety/Judgment  remain free of injury, prevent falls with cues and reminders   Therapy Plan: PT Intensity: Minimum of 1-2 x/day ,45 to 90 minutes PT Frequency: 5 out of 7 days PT Duration Estimated Length of Stay: 10-14 days OT Intensity: Minimum of 1-2 x/day, 45 to 90 minutes OT Frequency: 5 out of 7 days OT Duration/Estimated Length of Stay: 10-14     Due to the current state of emergency, patients may not be receiving their 3-hours of Medicare-mandated therapy.   Team Interventions: Nursing Interventions Pain Management, Disease Management/Prevention, Medication Management, Psychosocial Support, Discharge Planning, Patient/Family Education  PT interventions  Ambulation/gait training, Training and development officer, Cognitive remediation/compensation, Community reintegration, Discharge planning, Disease management/prevention, DME/adaptive equipment instruction, Functional electrical stimulation, Functional mobility training, Neuromuscular re-education, Pain management, Patient/family education, Psychosocial support, Skin care/wound management, Splinting/orthotics, Stair training, Therapeutic Activities, Therapeutic Exercise, Visual/perceptual remediation/compensation, UE/LE Coordination activities, UE/LE Strength taining/ROM  OT Interventions Balance/vestibular training, Discharge planning, Pain management, Self Care/advanced ADL retraining, Therapeutic Activities, UE/LE Coordination activities, Visual/perceptual remediation/compensation, Therapeutic Exercise, Skin care/wound managment, Patient/family education, Disease mangement/prevention, Cognitive remediation/compensation, Community reintegration, Engineer, drilling, Functional mobility training, Neuromuscular re-education, Psychosocial support, Splinting/orthotics, UE/LE Strength taining/ROM, Wheelchair propulsion/positioning  SLP Interventions    TR Interventions    SW/CM Interventions Discharge Planning, Patient/Family Education, Psychosocial Support   Barriers to Discharge MD  Medical stability  Nursing      PT Home environment access/layout, Neurogenic Bowel & Bladder, Incontinence    OT Decreased caregiver support unsure how much help husband will be ar DC per pt "my role is to make sure he eats"  SLP      SW       Team Discharge Planning: Destination: PT-Home ,OT- Home , SLP-  Projected Follow-up: PT-Home health PT, 24 hour supervision/assistance, OT-  Home health OT, SLP-  Projected Equipment Needs: PT-To be determined, OT- To be determined, Tub/shower seat, Tub/shower bench, SLP-  Equipment Details: PT- , OT-  Patient/family involved in discharge planning: PT- Patient,  Family member/caregiver,  OT- , SLP-   MD ELOS: 7-12d Medical Rehab Prognosis:  Good Assessment:  86  year old female with history of A fib/flutter s/p flutter--off AC due to NSR, recent treatment for PNA who was admitted on 11/13/2019 with left hemiparesis and right gaze preference. CTA head unremarkable for acute intracranial process and CTA showed proximal Right PCA occlusion with emphysema and layering left pleural effusion c/w PNA. She underwent cerebral angio with revascularization of R-PCA by Dr. Earleen Lynch. MRI/MRA brain done revealing right thalamic infarct with mild SAH in interpeduncular cistern and small areas of acute infarct in right cerebellum and right frontal cortex.  Echocardiogram with EF of 60-65% with severe asymmetric basal septal hypertrophy of LV and mild aortic sclerosis with trivial regurgitation. She completed 7 day course of Augmentin for PNA on 10/26.    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 She underwent ultrasound guided trhombin injection of R-CFA with good results on 10/22. Eliquis added on 10/24. She continued to have progressive drop in H/H 13.3-->10.5-->8.8 with dizziness and nausea and reported worsening of right groin pain on 10/29. CTA abdomen/pelvis showed recurrent pseudoaneurysm.  Dr. Scot Lynch consulted for input and recommended repeat thrombin injection which was performed by IR on 10/27. Post procedure scan ultrasound the next day showed closed pseudoaneurysm and Eliquis resumed    Now requiring 24/7 Rehab RN,MD, as well as CIR level PT, OT and SLP.  Treatment team will focus on ADLs and mobility with goals set at supervision   See Team Conference Notes for weekly updates to the plan of care

## 2019-11-26 NOTE — Progress Notes (Signed)
Physical Therapy Session Note  Patient Details  Name: [name] MRN: [id number] Date of Birth: [birth year]  Today's Date: 11/26/2019 PT Individual Time: 1015-1100  And 1300-1330 PT Individual Time Calculation (min): 45 min and 30 min  Short Term Goals: Week 1:  PT Short Term Goal 1 (Week 1): Pt will perform supine<>sit with supervision PT Short Term Goal 2 (Week 1): Pt will perform sit<>stands using LRAD with CGA PT Short Term Goal 3 (Week 1): Pt will perform stand pivot transfers using LRAD with CGA PT Short Term Goal 4 (Week 1): Pt will ambulate at least 151ft using LRAD with CGA Week 2:    Week 3:     Skilled Therapeutic Interventions/Progress Updates:    AM SESSION PAIN denies pain, states mild soreness at surgical incision site/thigh  Pt initially supine in bed and agreeable to session, not dressed at this time and requesting to use BR.  Purwick removed and pt requests to ambulate to commode before dressing.  Supine to sit w/cues and min assist.  STS w/min assist and gait 69ft to commode including doorway transition w/cues for maintaining safe distance within walker and min assist. Commode transfer w/min assist.   Pt incontinent of small urine steam en route to commode. Pt able to void and performs hygiene w/supervision. Pt provided w washcloths and washes face then perinium w/supervision in sitting, STS w/min assist and completes washing perineum w/min assist for balance. Returns to sitting and dons underwear to thighs w/mod assist, places incontinence pad w/set up. Doffs shirt and dons clean shirt w/set up and min assist. STS w/min assist w/RW.  Raises underwear and pants w/mod assist. Pt c/o dizzyness and fatigue, requests to lie down.   Gait 1ft to bed w/RW and min assist. Sit to supine w/cues only. Pt rested several min then returned to sitting on edge as above. STS from bed to RW then worked on forward reaching w/single UE support, alternating hands, cga Repeated  reaching without use of walker and cga, no balance loss. Stand pivot transfer to recliner w/min assist and cues to complete turn jprior to sitting. STS from chair and gait/retrogait 77ft w/RW and min assist, pt states feeling uncomfortable w/retrogait but completed task. Pt donned pants to thighs w/mod assist. STS from recliner w/min assist and able to raise pants w/both hands and cga for balance.  Pt left oob in recliner w/chair alarm set and needs in reach.  PM SESSION PAIN denies pain    Pt initially oob in recliner.  Sit to stand w/cga w/RW and gait 61ft around bed to wc.w/cga, turn/sit w/cga. Pt transported to gym for session focused on functional gait.  STS w/cga to rw and gait around cones placed at 92ft intervals total distance 67ft w/cga only. Pt w/shuffling gait, cues for maintaining safe distance within walker.   sts w/cga no device and gait x 14ft no AD and cga, shuffling gait but similar to w/RW. Pt c/o bowel urgency.  SPT to wc w/cga.  Transported to bathroom. SPT w/min assist to commode., min assist for clothing management.  Pt left seated on commode w/call bell in reach and NT in room to assist pt.   Therapy Documentation Precautions:  Precautions Precautions: Fall Precaution Comments: watch BP, dizziness Restrictions Weight Bearing Restrictions: No    Therapy/Group: Individual Therapy  Callie Fielding, [place] 11/26/2019, 12:52 PM

## 2019-11-26 NOTE — Care Management (Signed)
Inpatient [facility] Individual Statement of Services  Patient Name:  [name]  Date:  11/26/2019  Welcome to the [facility].  Our goal is to provide you with an individualized program based on your diagnosis and situation, designed to meet your specific needs.  With this comprehensive rehabilitation program, you will be expected to participate in at least 3 hours of rehabilitation therapies Monday-Friday, with modified therapy programming on the weekends.  Your rehabilitation program will include the following services:  Physical Therapy (PT), Occupational Therapy (OT), 24 hour per day rehabilitation nursing, Therapeutic Recreaction (TR), Psychology, Neuropsychology, Care Coordinator, Rehabilitation Medicine, Nutrition Services, Pharmacy Services and Other  Weekly team conferences will be held on Wednesdays to discuss your progress.  Your Inpatient Rehabilitation Care Coordinator will talk with you frequently to get your input and to update you on team discussions.  Team conferences with you and your family in attendance may also be held.  Expected length of stay: 10-14 days   Overall anticipated outcome: Supervision  Depending on your progress and recovery, your program may change. Your Inpatient Rehabilitation Care Coordinator will coordinate services and will keep you informed of any changes. Your Inpatient Rehabilitation Care Coordinator's name and contact numbers are listed  below.  The following services may also be recommended but are not provided by the [facility] will be made to provide these services after discharge if needed.  Arrangements include referral to agencies that provide these services.  Your insurance has been verified to be:  Medicare A/B  Your primary doctor is:  Crist Infante  Pertinent information will be shared with your doctor and your insurance company.  Inpatient Rehabilitation Care Coordinator:  Erlene Quan, [place] or [phone]  Information discussed with and copy given to patient by: Rana Snare, 11/26/2019, 11:35 AM

## 2019-11-27 ENCOUNTER — Encounter (HOSPITAL_COMMUNITY): Payer: Medicare Other | Admitting: Psychology

## 2019-11-27 ENCOUNTER — Inpatient Hospital Stay (HOSPITAL_COMMUNITY): Payer: Medicare Other | Admitting: Physical Therapy

## 2019-11-27 ENCOUNTER — Inpatient Hospital Stay (HOSPITAL_COMMUNITY): Payer: Medicare Other | Admitting: Occupational Therapy

## 2019-11-27 ENCOUNTER — Inpatient Hospital Stay (HOSPITAL_COMMUNITY): Payer: Medicare Other | Admitting: Speech Pathology

## 2019-11-27 DIAGNOSIS — I63531 Cerebral infarction due to unspecified occlusion or stenosis of right posterior cerebral artery: Secondary | ICD-10-CM | POA: Diagnosis not present

## 2019-11-27 DIAGNOSIS — J189 Pneumonia, unspecified organism: Secondary | ICD-10-CM | POA: Diagnosis not present

## 2019-11-27 MED ORDER — SODIUM CHLORIDE 0.9% FLUSH
3.0000 mL | Freq: Two times a day (BID) | INTRAVENOUS | Status: DC
  Administered 2019-11-27 (×2): 3 mL via INTRAVENOUS

## 2019-11-27 NOTE — Progress Notes (Signed)
Physical Therapy Session Note  Patient Details  Name: [name] MRN: [id number] Date of Birth: 03-May-1931  Today's Date: 11/27/2019 PT Individual Time: 1500-1530 PT Individual Time Calculation (min): 30 min   Short Term Goals: Week 1:  PT Short Term Goal 1 (Week 1): Pt will perform supine<>sit with supervision PT Short Term Goal 2 (Week 1): Pt will perform sit<>stands using LRAD with CGA PT Short Term Goal 3 (Week 1): Pt will perform stand pivot transfers using LRAD with CGA PT Short Term Goal 4 (Week 1): Pt will ambulate at least 14f using LRAD with CGA  Skilled Therapeutic Interventions/Progress Updates: Pt presented in bed agreeable to therapy. Pt verbalized still felt "traumatized" from orthostatic even earlier this afternoon. Pt with thigh high TED hose and BP checked in supine 123/61 (77) HR 76. Pt performed supine to sit at EOB with CGA and increased time with use of bed features. Pt scooted to EOB with CGA. Pt indicated no s/s hypotension sitting EOB. Pt participated in ankle pumps, LAQ, hip abd/add 2 x 10 bilaterally. Pt encouraged by sitting EOB and requested to transfer to recliner. PTA obtained pants for pt and threaded pants maxA for time management. Pt performed STS with minA from EOB no AD and pt was able to pull pants up over hips with minA. Pt then ambulated with HHA to recliner approx 151f Pt initally reaching for objects to steady self but improved with verbal cues. Once pt in recliner BP checked 132/57 (78) HR 79. Pt repositioned self to comfort and left with seat alarm on, call bell within reach and needs met.      Therapy Documentation Precautions:  Precautions Precautions: Fall Precaution Comments: watch BP, dizziness Restrictions Weight Bearing Restrictions: No General:   Vital Signs: Therapy Vitals Temp: (!) 97.3 F (36.3 C) Pulse Rate: 79 Resp: 16 BP: 114/72 Patient Position (if appropriate): Sitting Oxygen Therapy SpO2: 98 % O2 Device: Room  Air Pain: Pain Assessment Pain Scale: 0-10 Pain Score: 0-No pain  Therapy/Group: Individual Therapy  Davis Ambrosini  Renee Erb, PTA  11/27/2019, 3:49 PM

## 2019-11-27 NOTE — Progress Notes (Signed)
Physical Therapy Session Note  Patient Details  Name: [name] MRN: [id number] Date of Birth: 1931-08-20  Today's Date: 11/27/2019 PT Individual Time: 0910-1005 PT Individual Time Calculation (min): 55 min   Short Term Goals: Week 1:  PT Short Term Goal 1 (Week 1): Pt will perform supine<>sit with supervision PT Short Term Goal 2 (Week 1): Pt will perform sit<>stands using LRAD with CGA PT Short Term Goal 3 (Week 1): Pt will perform stand pivot transfers using LRAD with CGA PT Short Term Goal 4 (Week 1): Pt will ambulate at least 159ft using LRAD with CGA  Skilled Therapeutic Interventions/Progress Updates:    Pt received sitting on toilet with LPN assisting - pt agreeable to therapy session therefore therapist assumed care of patient. Pt requesting additional time and continent of BM. Initiated seated peri-care with pt starting to wipe and resulting in BM getting on hands due to pt being unable to feel the sensation that she was still voiding - provided wash cloth for hand hygiene. Once finished voiding, completed standing peri-care with assist for cleanliness. Sit<>stand to/from toilet using grab bars with min assist for balance. Donned underwear with incontinence pad and pants with max assist for time management. Gait ~68ft to sink with L HHA and min assist for balance. Completed standing hand hygiene at sink with CGA. Pt reports feeling weak, allowed seated rest break - assessed vitals: BP 125/75 (MAP 89), HR 84bpm - and pt reports feeling better. Throughout session pt reports feeling fatigued. Transported to/from gym in w/c for time management and energy conservation. Gait training ~38ft, ~31ft, ~56ft using L HHA with min assist for balance - demos shuffled gait with decreased B LE foot clearance and step lengths (R more impaired than L) and wider BOS - cuing for improvement with pt stating the improved walking feels "foreign" due to long hx of shuffling while ambulating. Pt noted to have  some incontinence of bowels. Transported back to room in w/c. Gait ~9ft x2 in/out bathroom using LHHA with min assist for balance - performed peri-care and LB clothing management as described above (educated pt on wearing incontinence brief at this time). Pt requesting to rest. Sit>supine with CGA for trunk descent. Pt left supine in bed with needs in reach and bed alarm on.  Therapy Documentation Precautions:  Precautions Precautions: Fall Precaution Comments: watch BP, dizziness Restrictions Weight Bearing Restrictions: No  Pain:   No reports of pain throughout session.   Therapy/Group: Individual Therapy  Tawana Scale , PT, DPT, CSRS  11/27/2019, 7:53 AM

## 2019-11-27 NOTE — Progress Notes (Signed)
Speech Language Pathology Daily Session Note  Patient Details  Name: [name] MRN: [id number] Date of Birth: [birth year]  Today's Date: 11/27/2019 SLP Individual Time: 1100-1155 SLP Individual Time Calculation (min): 55 min  Short Term Goals: Week 1: SLP Short Term Goal 1 (Week 1): Pt will recall daily events and novel information with supervision A verbal cues for use of external/internal aids. SLP Short Term Goal 2 (Week 1): Pt will complete basic-mildly complex problem solving tasks with min A verbal cues. SLP Short Term Goal 3 (Week 1): Pt will self-monitory and self-correct errors during problem solving tasks with mod A verbal cues. SLP Short Term Goal 4 (Week 1): Pt will demonstrate sustained attention in 15 minute intervals during mildly complex problem solving tasks with supervision A verbal cues for redirection.  Skilled Therapeutic Interventions: Pt was seen for skilled ST targeting cognitive goals. Pt's son, Sherren Mocha, present for session. Pt participated in verbal medication recall and management task, during which she was Mod I for recall of current medication names and changes. Given that pt is on so few medications, SLP and pt discussed her strategy to manage at home - use of association and routines to prompt her to take meds (all taken at same time of day). SLP also discussed use of daily recurring timers as an external aid and additional strategy to aid in recall of time to take medications. Pt reported she and her husband manage finances together and use checkbook, therefore checkbook balancing activity will be targeted at next available opportunity (could not target today due to time constraints). Pt's son provided more baseline information and also reports his perception that pt is very close to baseline cognitive function, with mild thought organization still noted in his observations. Pt left laying in bed with alarm set and needs within reach. Continue per current plan of care.           Pain Pain Assessment Pain Scale: 0-10 Pain Score: 0-No pain  Therapy/Group: Individual Therapy  Arbutus Leas 11/27/2019, 12:03 PM

## 2019-11-27 NOTE — Consult Note (Signed)
Neuropsychological Consultation   Patient:   [name]   DOB:   1931/11/01  MR Number:  [id number]  Location:  [facility] [id number] [place] [address] Dept: [facility]: [phone]           Date of Service:   11/27/2019  Start Time:   2 PM End Time:   3 PM  Provider/Observer:  Ilean Skill, Psy.D.       Clinical Neuropsychologist       Billing Code/Service: 09735  Chief Complaint:    [name]. Bahner is an 86 year old female with history of A. fib, recent treatment for pneumonia.  Patient with no history of dementia other cognitive decline in been healthy active individual beyond her A. fib history.  Patient was admitted on 11/13/2019 with left hemiparesis and right gaze preference.  CTA showed proximal right PCA occlusion along with indications of pneumonia.  Patient underwent cerebral angio with revascularization of right PCA.  MRI/MRI brain done revealing right thalamic infarct and mild subarachnoid hemorrhage in the interpeduncular cistern and small areas of acute infarct in right cerebellum and right frontal cortex.  Post procedure patient developed right groin hematoma and underwent ultrasound-guided interventions with good results.  Patient had had ongoing episodes of confusion, vivid dreams and hypnagogic experiences during her hospitalization and care during CIR.  Patient's mood is quite positive but residual effects of her stroke and treatment are continuing.  Reason for Service:  Patient was referred for neuropsychological consultation due to adjustment coping issues and changes in cognition and visual perceptual/visual hallucination type symptoms.  Below is the HPI for the current admission.  HPI: Melissa Blas. Lynch is an 86 year old female with history of A fib/flutter s/p flutter--off AC due to NSR, recent treatment for PNA who was admitted on 11/13/2019 with left  hemiparesis and right gaze preference. CTA head unremarkable for acute intracranial process and CTA showed proximal Right PCA occlusion with emphysema and layering left pleural effusion c/w PNA. She underwent cerebral angio with revascularization of R-PCA by Dr. Earleen Newport. MRI/MRA brain done revealing right thalamic infarct with mild SAH in interpeduncular cistern and small areas of acute infarct in right cerebellum and right frontal cortex.  Echocardiogram with EF of 60-65% with severe asymmetric basal septal hypertrophy of LV and mild aortic sclerosis with trivial regurgitation. She completed 7 day course of Augmentin for PNA on 10/26.   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 She underwent ultrasound guided trhombin injection of R-CFA with good results on 10/22. Eliquis added on 10/24. She continued to have progressive drop in H/H 13.3-->10.5-->8.8 with dizziness and nausea and reported worsening of right groin pain on 10/29. CTA abdomen/pelvis showed recurrent pseudoaneurysm.  Dr. Scot Dock consulted for input and recommended repeat thrombin injection which was performed by IR on 10/27. Post procedure scan ultrasound the next day showed closed pseudoaneurysm and Eliquis resumed yesterday. Therapy has been ongoing and  CIR recommended due to functional decline. Please see preadmission assessment from earlier today as well.   Current Status:  The patient was very pleasant and in good spirits when I entered the room.  She immediately began to describe some of the very vivid dreams and/or hypnagogue types of experiences/visual hallucinations and sensory hallucinations that she has had.  These are likely of no significant medical consequence and part of residual effects of her stroke and/or medications and treatments that she  has had.  Patient was not delusional not believing that these experiences were real such as feeling like her room was going to float away or that there  were mice coming out of the wall but they had called her distress at the moment that were happening.  The patient son was also present and reported that these were descriptions and changes that she had talked about but were not causing any significant distress although the patient was described as having times of confusion and difficulty with complete awareness of everything that was going around with her.  The patient had been doing very well prior to the stroke with no history of dementia and certainly no indications of Lewy body dementia or other things that would be causing visual hallucinations beyond her stroke/pneumonia and other recent medical events.  Expressive language and receptive language appeared to be within normal limits and with the exception of some of the confusion overall judgment and executive function appeared to generally be within normal limits.  Behavioral Observation: QUANASIA DEFINO  presents as a 87 y.o.-year-old Right Caucasian Female who appeared her stated age. her dress was Appropriate and she was Well Groomed and her manners were Appropriate to the situation.  her participation was indicative of Appropriate and Redirectable behaviors.  There were any physical disabilities noted.  she displayed an appropriate level of cooperation and motivation.     Interactions:    Active Appropriate and Redirectable  Attention:   abnormal and attention span appeared shorter than expected for age  Memory:   within normal limits; recent and remote memory intact  Visuo-spatial:  abnormal patient appears to be having some visual spatial anomalies that her brain is producing gestalt for creating some types of visual hallucinations and misperceptions.  Speech (Volume):  normal  Speech:   normal; normal  Thought Process:  Coherent and Relevant  Though Content:  WNL; hallucinations, not suicidal and not homicidal  Orientation:   person, place, time/date and  situation  Judgment:   Fair  Planning:   Fair  Affect:    Anxious  Mood:    Anxious  Insight:   Fair  Intelligence:   high  Medical History:   Past Medical History:  Diagnosis Date  . Atrial fibrillation ([facility])    Prior treatment with amiodarone, held sinus rhythm and drug stopped  . Atrial flutter ([facility])    Status post ablation prior to  2009  . Chest pain    Nuclear, 2004, no scar or ischemia, EF 77%  . Ejection fraction    EF 70%, nuclear,  2004  //     . Fall    July, 2014  . H/O bladder repair surgery   . Right Achilles tendinitis   . Sciatica      Psychiatric History:  No prior psychiatric history noted.  Family Med/Psych History:  Family History  Problem Relation Age of Onset  . Cancer Mother   . Cancer Father        pancreatic cancer  . Heart disease Brother     Impression/DX:  Ruchy T. Yankey is an 86 year old female with history of A. fib, recent treatment for pneumonia.  Patient with no history of dementia other cognitive decline in been healthy active individual beyond her A. fib history.  Patient was admitted on 11/13/2019 with left hemiparesis and right gaze preference.  CTA showed proximal right PCA occlusion along with indications of pneumonia.  Patient underwent cerebral angio with revascularization of right PCA.  MRI/MRI brain done revealing right thalamic infarct and mild subarachnoid hemorrhage in the interpeduncular cistern and small areas of acute infarct in right cerebellum and right frontal cortex.  Post procedure patient developed right groin hematoma and underwent ultrasound-guided interventions with good results.  Patient had had ongoing episodes of confusion, vivid dreams and hypnagogic experiences during her hospitalization and care during CIR.  Patient's mood is quite positive but residual effects of her stroke and treatment are continuing.  The patient was very pleasant and in good spirits when I entered the room.  She immediately began to  describe some of the very vivid dreams and/or hypnagogue types of experiences/visual hallucinations and sensory hallucinations that she has had.  These are likely of no significant medical consequence and part of residual effects of her stroke and/or medications and treatments that she has had.  Patient was not delusional not believing that these experiences were real such as feeling like her room was going to float away or that there were mice coming out of the wall but they had called her distress at the moment that were happening.  The patient son was also present and reported that these were descriptions and changes that she had talked about but were not causing any significant distress although the patient was described as having times of confusion and difficulty with complete awareness of everything that was going around with her.  The patient had been doing very well prior to the stroke with no history of dementia and certainly no indications of Lewy body dementia or other things that would be causing visual hallucinations beyond her stroke/pneumonia and other recent medical events.  Expressive language and receptive language appeared to be within normal limits and with the exception of some of the confusion overall judgment and executive function appeared to generally be within normal limits.   Disposition/Plan:  Today after review of cognitive function and discussion with the patient issues around her visual hallucinations/hypnagogic experiences we did work on some coping issues dealing with residual effects of her stroke and changes in overall functioning.        Electronically Signed   _______________________ Ilean Skill, Psy.D.

## 2019-11-27 NOTE — Progress Notes (Signed)
[facility] PHYSICAL MEDICINE & REHABILITATION PROGRESS NOTE   Subjective/Complaints:    ROS: Patient denies CP, SOB, N/V/D    Objective:   No results found. No results for input(s): WBC, HGB, HCT, PLT in the last 72 hours. Recent Labs    11/26/19 0841  NA 139  K 3.6  CL 105  CO2 23  GLUCOSE 146*  BUN 12  CREATININE 0.79  CALCIUM 8.5*    Intake/Output Summary (Last 24 hours) at 11/27/2019 0850 Last data filed at 11/26/2019 1700 Gross per 24 hour  Intake 360 ml  Output --  Net 360 ml        Physical Exam: Vital Signs Blood pressure 134/72, pulse 67, temperature 98.6 F (37 C), resp. rate 16, height 5' (1.524 m), weight 62.8 kg, SpO2 96 %.  General: No acute distress Mood and affect are appropriate Heart: Regular rate and rhythm no rubs murmurs or extra sounds Lungs: Clear to auscultation, breathing unlabored, no rales or wheezes Abdomen: Positive bowel sounds, soft nontender to palpation, nondistended Extremities: No clubbing, cyanosis, or edema Skin: No evidence of breakdown, no evidence of rash  Neuro: Pt is cognitively appropriate with normal insight, memory, and awareness. Cranial nerves 2-12 are intact. Sensory exam is normal. Reflexes are 2+ in all 4's. Fine motor coordination is intact. No tremors. Motor function is grossly 5/5 UE. LLE 5-/5. RLE 4- to 4/5---pain inhibition RLE Musculoskeletal: Full ROM, No pain with AROM or PROM in the neck, trunk, or extremities. Posture appropriate       Assessment/Plan: 1. Functional deficits secondary to right cerebellar, frontal and thalamic infarcts with [facility] which require 3+ hours per day of interdisciplinary therapy in a comprehensive inpatient rehab setting.  Physiatrist is providing close team supervision and 24 hour management of active medical problems listed below.  Physiatrist and rehab team continue to assess barriers to discharge/monitor patient progress toward functional and medical goals  Care  Tool:  Bathing    Body parts bathed by patient: Right arm, Left arm         Bathing assist Assist Level: Supervision/Verbal cueing     Upper Body Dressing/Undressing Upper body dressing   What is the patient wearing?: Pull over shirt    Upper body assist Assist Level: Supervision/Verbal cueing    Lower Body Dressing/Undressing Lower body dressing      What is the patient wearing?: Pants     Lower body assist Assist for lower body dressing: Contact Guard/Touching assist     Toileting Toileting    Toileting assist Assist for toileting: Moderate Assistance - Patient 50 - 74%     Transfers Chair/bed transfer  Transfers assist     Chair/bed transfer assist level: Minimal Assistance - Patient > 75%     Locomotion Ambulation   Ambulation assist      Assist level: Minimal Assistance - Patient > 75% Assistive device: Walker-rolling Max distance: 15   Walk 10 feet activity   Assist     Assist level: Minimal Assistance - Patient > 75% Assistive device: Walker-rolling   Walk 50 feet activity   Assist    Assist level: Minimal Assistance - Patient > 75% Assistive device: Hand held assist    Walk 150 feet activity   Assist Walk 150 feet activity did not occur: Safety/medical concerns         Walk 10 feet on uneven surface  activity   Assist     Assist level: Minimal Assistance - Patient > 75% (ramp)  Assistive device: Hand held assist, Other (comment) (railing)   Wheelchair     Assist Will patient use wheelchair at discharge?: No             Wheelchair 50 feet with 2 turns activity    Assist            Wheelchair 150 feet activity     Assist          Blood pressure 134/72, pulse 67, temperature 98.6 F (37 C), resp. rate 16, height 5' (1.524 m), weight 62.8 kg, SpO2 96 %.  Medical Problem List and Plan: 1.  Deficits with mobility, endurance, self-care secondary to acute infarct in right cerebellum,  right frontal cortex, right thalamus with mild SAH.             -patient may shower             -ELOS/Goals: 7-12 days/mod I/supervision             --Continue CIR therapies including PT, OT, and SLP  2.  Antithrombotics: -DVT/anticoagulation:  Pharmaceutical: Other (comment)--Elquis             -antiplatelet therapy: N/A 3. Right groin pain/Pain Management: Tylenol prn 4. Mood: LCSW to follow for evaluation and support.              -antipsychotic agents: N/A 5. Neuropsych: This patient is capable of making decisions on her own behalf. 6. Skin/Wound Care: local care to right groin. Routine pressure relief measures.  7. Fluids/Electrolytes/Nutrition: Encourage intake.  CMP ordered.I: 754ml 8. LXB:WIOMBTD BP tid    Vitals:   11/26/19 1943 11/27/19 0510  BP: 119/68 134/72  Pulse: 73 67  Resp: 17 16  Temp: 99.1 F (37.3 C) 98.6 F (37 C)  SpO2: 97% 96%  controlled 11/2 9. A fib: HR controlled off metoprolol.  Eliquis resumed 10/28.              HR in 60's today. 10. Right groin pseudoaneurysm: Resolved--continue to monitor daily.  11. Acute blood loss anemia:              CBC ordered. 12.  Chronic UTIs/urinary frequency: Resumed suppressive therapy.  -PVR scan yesterday was 76cc  -purewick catheter for HS frequency very helpful 13. Slow transit constipation: managed with colace and miralax.              Adjust all meds as necessary   -bm 10/31 medium type 5 14.  ? PNA some pleural effusions an dchronic interstitial changes on xray , no leukocytosis, afebrile without cough LOS: 4 days A FACE TO FACE EVALUATION WAS PERFORMED  Charlett Blake 11/27/2019, 8:50 AM

## 2019-11-27 NOTE — Progress Notes (Signed)
Occupational Therapy Session Note  Patient Details  Name: [name] MRN: [id number] Date of Birth: 12-Nov-1931  Today's Date: 11/27/2019 OT Individual Time: 1300-1405 OT Individual Time Calculation (min): 65 min    Short Term Goals: Week 1:  OT Short Term Goal 1 (Week 1): Pt will tolerate 1 hour session wiht no changes in vitals to demo improved activity tolerance/medical stability OT Short Term Goal 2 (Week 1): Pt Will transfer to toilet wiht CGA OT Short Term Goal 3 (Week 1): Pt will thread BLE into LB clothing wiht S OT Short Term Goal 4 (Week 1): Pt will maintain dynamic sitting balance at EOB/EOM wiht S OT Short Term Goal 5 (Week 1): Pt will compelte peri hygeine with S  Skilled Therapeutic Interventions/Progress Updates:  Patient met lying supine in bed in agreement with OT treatment session. BP 123/78 at start of session. Functional mobility to commode in bathroom and toilet transfer with HHA. Patient completed 3/3 parts of toileting seated on commode. Patient able to doff UB/LB clothing seated on commode. Sit to stand from commode and walk-in shower transfer with CGA. Patient able to bathe UB with supervision A and LB with CGA in sitting/standing. While drying, patient became pale and began c/o dizziness. Vitals assessed with BP 90/39. Thigh high TED hose applied to BLE with BP improving to 114/72. Low squat-pivot transfer to wc. OT exited room with patient seated in wc with nursing staff present.   Therapy Documentation Precautions:  Precautions Precautions: Fall Precaution Comments: watch BP, dizziness Restrictions Weight Bearing Restrictions: No General:    Therapy/Group: Individual Therapy  Cherl Gorney R Howerton-Davis 11/27/2019, 3:01 PM

## 2019-11-27 NOTE — Progress Notes (Signed)
Patient ID: [name], female   DOB: 17-Dec-1931, 86 y.o.   MRN: [id number]  Met with the patient to review the role of the nurse CM and collaboration with the SW to facilitate preparation for discharge. Reviewed risk factors for secondary stroke and treatment for A-fib with Eliquis. Acknowledged LDL level and MD order to hold lipitor. Patient noted she and her spouse had wondered why she was on a statin PTA given her lab work results. No questions or concerns noted at present. Continue to follow along to discharge for educational needs. [name]

## 2019-11-28 ENCOUNTER — Inpatient Hospital Stay (HOSPITAL_COMMUNITY): Payer: Medicare Other | Admitting: Physical Therapy

## 2019-11-28 ENCOUNTER — Inpatient Hospital Stay (HOSPITAL_COMMUNITY): Payer: Medicare Other

## 2019-11-28 DIAGNOSIS — I63531 Cerebral infarction due to unspecified occlusion or stenosis of right posterior cerebral artery: Secondary | ICD-10-CM | POA: Diagnosis not present

## 2019-11-28 MED ORDER — DOCUSATE SODIUM 100 MG PO CAPS: 100.0000 mg | ORAL_CAPSULE | Freq: Every day | ORAL | Status: DC | PRN

## 2019-11-28 NOTE — Plan of Care (Signed)
  Problem: Consults Goal: RH STROKE PATIENT EDUCATION Description: See Patient Education module for education specifics  Outcome: Progressing   Problem: RH BLADDER ELIMINATION Goal: RH STG MANAGE BLADDER WITH ASSISTANCE Description: STG Manage Bladder With mod I Assistance Outcome: Progressing   Problem: RH SKIN INTEGRITY Goal: RH STG MAINTAIN SKIN INTEGRITY WITH ASSISTANCE Description: STG Maintain Skin Integrity With mod I Assistance. Outcome: Progressing   Problem: RH PAIN MANAGEMENT Goal: RH STG PAIN MANAGED AT OR BELOW PT'S PAIN GOAL Description: Pain level less than 4 on scale of 0-10 Outcome: Progressing   Problem: RH KNOWLEDGE DEFICIT Goal: RH STG INCREASE KNOWLEDGE OF HYPERTENSION Description: Pt will be able to demonstrate understanding of medication regimen, dietary and lifestyle modifications to better control blood pressure and prevent further stroke with mod I assist using handouts and booklet provided. Outcome: Progressing Goal: RH STG INCREASE KNOWLEDGE OF STROKE PROPHYLAXIS Description: Pt will be able to demonstrate understanding of medication regimen, dietary and lifestyle modifications to better control blood pressure and prevent further stroke with mod I assist using handouts and booklet provided. Outcome: Progressing

## 2019-11-28 NOTE — Progress Notes (Addendum)
Physical Therapy Session Note  Patient Details  Name: [name] MRN: [id number] Date of Birth: [birth year]  Today's Date: 11/28/2019 PT Individual Time: 1545-1630 PT Individual Time Calculation (min): 45 min   Short Term Goals: Week 1:  PT Short Term Goal 1 (Week 1): Pt will perform supine<>sit with supervision PT Short Term Goal 2 (Week 1): Pt will perform sit<>stands using LRAD with CGA PT Short Term Goal 3 (Week 1): Pt will perform stand pivot transfers using LRAD with CGA PT Short Term Goal 4 (Week 1): Pt will ambulate at least 146f using LRAD with CGA  Skilled Therapeutic Interventions/Progress Updates:   Pt received supine in bed and agreeable to PT in room. Supine>sit transfer with CGA from PT for safety and cues for improved BLE positioning once EOB. Pt performed sit<>stand from EOB on airex pad with min assist from elevated surface x 3.   Standing on airex pad:  Pt performed forward reach to place clothes pins on horizontal rails x 12 with the RUE and then the LUE.  Pt reports mild dizziness upon completion but resolved in sitting EOB.  Pt then performed stand pivot transfer to standing scale with HHA from PT with cues for UE support on rail once pt stepped up 2 inch lip onto scale. Posterior decent off scale with HHA.    In standing , pt completed Low difficulty Pipe tree puzzle with min assist to prevent L/posterior bias and min cues for problem solving.   [facility] and all needs met.         Therapy Documentation Precautions:  Precautions Precautions: Fall Precaution Comments: watch BP, dizziness Restrictions Weight Bearing Restrictions: No Vital Signs: Therapy Vitals Temp: 98.8 F (37.1 C) Temp Source: Oral Pulse Rate: 77 Resp: 16 BP: 117/70 Patient Position (if appropriate): Lying Oxygen Therapy SpO2: 97 % O2 Device: Room Air Pain: Pain Assessment Pain Score: 0-No pain    Therapy/Group: Individual  Therapy  ALorie Phenix11/03/2019, 4:40 PM

## 2019-11-28 NOTE — Progress Notes (Signed)
[facility] PHYSICAL MEDICINE & REHABILITATION PROGRESS NOTE   Subjective/Complaints:  Complaining of frequent BMs yesterday.  No abdominal pain no nausea.  Had poor control when bowels were loose.  Also discussed episode with low blood pressure yesterday however no documentation was seen of this. Some right groin pain. ROS: Patient denies CP, SOB, N/V/D    Objective:   No results found. No results for input(s): WBC, HGB, HCT, PLT in the last 72 hours. Recent Labs    11/26/19 0841  NA 139  K 3.6  CL 105  CO2 23  GLUCOSE 146*  BUN 12  CREATININE 0.79  CALCIUM 8.5*    Intake/Output Summary (Last 24 hours) at 11/28/2019 0956 Last data filed at 11/28/2019 0700 Gross per 24 hour  Intake 463 ml  Output --  Net 463 ml        Physical Exam: Vital Signs Blood pressure (!) 143/88, pulse 72, temperature 98.3 F (36.8 C), resp. rate 15, height 5' (1.524 m), weight 63 kg, SpO2 93 %.  General: No acute distress Mood and affect are appropriate Heart: Regular rate and rhythm no rubs murmurs or extra sounds Lungs: Clear to auscultation, breathing unlabored, no rales or wheezes Abdomen: Positive bowel sounds, soft nontender to palpation, nondistended Extremities: No clubbing, cyanosis, or edema Skin: No evidence of breakdown, no evidence of rash  Neuro: Pt is cognitively appropriate with normal insight, memory, and awareness. Cranial nerves 2-12 are intact. Sensory exam is normal. Reflexes are 2+ in all 4's. Fine motor coordination is intact. No tremors. Motor function is grossly 5/5 UE. LLE 5-/5. RLE 4- to 4/5---pain inhibition RLE Musculoskeletal: Full ROM, No pain with AROM or PROM in the neck, trunk, or extremities. Posture appropriate       Assessment/Plan: 1. Functional deficits secondary to right cerebellar, frontal and thalamic infarcts with [facility] which require 3+ hours per day of interdisciplinary therapy in a comprehensive inpatient rehab setting.  Physiatrist is  providing close team supervision and 24 hour management of active medical problems listed below.  Physiatrist and rehab team continue to assess barriers to discharge/monitor patient progress toward functional and medical goals  Care Tool:  Bathing    Body parts bathed by patient: Right arm, Left arm, Chest, Abdomen, Front perineal area, Buttocks, Right upper leg, Left upper leg, Right lower leg, Left lower leg, Face         Bathing assist Assist Level: Contact Guard/Touching assist     Upper Body Dressing/Undressing Upper body dressing   What is the patient wearing?: Pull over shirt    Upper body assist Assist Level: Supervision/Verbal cueing    Lower Body Dressing/Undressing Lower body dressing      What is the patient wearing?: Pants     Lower body assist Assist for lower body dressing: Contact Guard/Touching assist     Toileting Toileting    Toileting assist Assist for toileting: Contact Guard/Touching assist     Transfers Chair/bed transfer  Transfers assist     Chair/bed transfer assist level: Minimal Assistance - Patient > 75%     Locomotion Ambulation   Ambulation assist      Assist level: Minimal Assistance - Patient > 75% Assistive device: Hand held assist Max distance: 1ft   Walk 10 feet activity   Assist     Assist level: Minimal Assistance - Patient > 75% Assistive device: Hand held assist   Walk 50 feet activity   Assist    Assist level: Minimal Assistance - Patient >  75% Assistive device: Hand held assist    Walk 150 feet activity   Assist Walk 150 feet activity did not occur: Safety/medical concerns         Walk 10 feet on uneven surface  activity   Assist     Assist level: Minimal Assistance - Patient > 75% (ramp) Assistive device: Hand held assist, Other (comment) (railing)   Wheelchair     Assist Will patient use wheelchair at discharge?: No             Wheelchair 50 feet with 2 turns  activity    Assist            Wheelchair 150 feet activity     Assist          Blood pressure (!) 143/88, pulse 72, temperature 98.3 F (36.8 C), resp. rate 15, height 5' (1.524 m), weight 63 kg, SpO2 93 %.  Medical Problem List and Plan: 1.  Deficits with mobility, endurance, self-care secondary to acute infarct in right cerebellum, right frontal cortex, right thalamus with mild SAH.             -patient may shower             -ELOS/Goals: 7-12 days/mod I/supervision             --Continue CIR therapies including PT, OT, and SLP  2.  Antithrombotics: -DVT/anticoagulation:  Pharmaceutical: Other (comment)--Elquis             -antiplatelet therapy: N/A 3. Right groin pain/Pain Management: Tylenol prn secondary to hematoma 4. Mood: LCSW to follow for evaluation and support.              -antipsychotic agents: N/A 5. Neuropsych: This patient is capable of making decisions on her own behalf. 6. Skin/Wound Care: local care to right groin. Routine pressure relief measures.  7. Fluids/Electrolytes/Nutrition: Encourage intake.  CMP ordered.I: 774ml 8. YIA:[id number] BP tid    Vitals:   11/28/19 0326 11/28/19 0326  BP: (!) 143/88 (!) 143/88  Pulse: 84 72  Resp: 15 15  Temp: 98.3 F (36.8 C) 98.3 F (36.8 C)  SpO2: 93% 93%  controlled 11/3 9. A fib: HR controlled off metoprolol.  Eliquis resumed 10/28.              Heart rates 70s and 80s 10. Right groin pseudoaneurysm: Still has hematoma which is improving 11. Acute blood loss anemia:              CBC ordered. 12.  Chronic UTIs/urinary frequency: Resumed suppressive therapy.  -PVR scan yesterday was 76cc  -purewick catheter for HS frequency very helpful 13. Slow transit constipation: managed with colace and miralax.              Adjust all meds as necessary   -bm 10/31 medium type 5 14.  ? PNA some pleural effusions an dchronic interstitial changes on xray , no leukocytosis, afebrile without cough LOS: 5 days A  FACE TO FACE EVALUATION WAS PERFORMED  Charlett Blake 11/28/2019, 9:56 AM

## 2019-11-28 NOTE — Progress Notes (Signed)
Speech Language Pathology Daily Session Note  Patient Details  Name: [name] MRN: [id number] Date of Birth: [birth year]  Today's Date: 11/28/2019 SLP Individual Time: 0803-0900 SLP Individual Time Calculation (min): 57 min  Short Term Goals: Week 1: SLP Short Term Goal 1 (Week 1): Pt will recall daily events and novel information with supervision A verbal cues for use of external/internal aids. SLP Short Term Goal 2 (Week 1): Pt will complete basic-mildly complex problem solving tasks with min A verbal cues. SLP Short Term Goal 3 (Week 1): Pt will self-monitory and self-correct errors during problem solving tasks with mod A verbal cues. SLP Short Term Goal 4 (Week 1): Pt will demonstrate sustained attention in 15 minute intervals during mildly complex problem solving tasks with supervision A verbal cues for redirection.  Skilled Therapeutic Interventions:Skilled ST services focused on cognitive skills. Pt's daughter was present today and expressed pt is not at cognitive baseline but progressing quickly. She also expressed pt has a baseline of difficulty cognitively in the morning verse earlier in the day, therefore ST will atemmpt to see pt later if possible. Pt demonstrated overall improved sustained attention and clarity compared to on evaluation with Probation officer. SLP facilitated complex problem solving and error awareness skills in checkbook balancing task, however pt demonstrated difficulty subtracting. SLP adjusted task to sequencing the charge dates in order in which she required min A verbal cues for recall within task. SLP will target money management in upcoming sessions to better reflect baseline task of checking general discrepancies verse balancing account. SLP also began facilitating semi-complex problem solving, recall and error awareness skills in calendar organization task, pt required mod A verbal cues for recall within task and error awareness and min A verbal cues for problem  solving. SLP will complete calendar task in upcoming sessions. Pt was left in room with daughter, call bell within reach and bed alarm set. SLP recommends to continue skilled services.     Pain Pain Assessment Pain Score: 0-No pain  Therapy/Group: Individual Therapy  [name]  [facility] 11/28/2019, 12:42 PM

## 2019-11-28 NOTE — Progress Notes (Signed)
Pt resting in bed Right upper thigh puncture site clean dry edema is improving. Remains ecchymotic and painful to touch. Pt states she has no pain when walking the pain is from trying to pull up her underwear. Pt was given mesh panties for increased comfort. Pt remains in bed with 3 SR up and call light within reach.

## 2019-11-28 NOTE — Progress Notes (Signed)
Patient ID: [name], female   DOB: [birth year], 86 y.o.   MRN: [id number] Team Conference Report to Patient/Family  Team Conference discussion was reviewed with the patient and caregiver, including goals, any changes in plan of care and target discharge date.  Patient and caregiver express understanding and are in agreement.  The patient has a target discharge date of 12/06/19.  Dyanne Iha 11/28/2019, 1:47 PM

## 2019-11-28 NOTE — Progress Notes (Signed)
Occupational Therapy Session Note  Patient Details  Name: [name] MRN: [id number] Date of Birth: Jul 06, 1931  Today's Date: 11/28/2019 OT Individual Time: 1120-1200 OT Individual Time Calculation (min): 40 min    Short Term Goals: Week 1:  OT Short Term Goal 1 (Week 1): Pt will tolerate 1 hour session wiht no changes in vitals to demo improved activity tolerance/medical stability OT Short Term Goal 2 (Week 1): Pt Will transfer to toilet wiht CGA OT Short Term Goal 3 (Week 1): Pt will thread BLE into LB clothing wiht S OT Short Term Goal 4 (Week 1): Pt will maintain dynamic sitting balance at EOB/EOM wiht S OT Short Term Goal 5 (Week 1): Pt will compelte peri hygeine with S  Skilled Therapeutic Interventions/Progress Updates:    Pt received supine with no c/o pain, c/o fatigue. Daughter present throughout session. NT entered to remove IV and assisted with pain management via distraction. Pt BP was assessed supine- 118/70. Thigh high teds were donned for BP support, total A. Pt performed bed mobility with supervision with use of bed rails. Pt completed sit > stand with CGA using the RW, with min cueing for hand placement. Ambulatory transfer to the sink with CGA. In standing pt completed UB bathing and oral care. Edu provided re importance of seated rest breaks on fatigue management. Pt required increased time to manage [facility] of bra fastening/unfastening but was able to do so without assist. Pt sat and finished oral care in w/c. Ambulatory transfer into the bathroom with CGA, using RW, min cueing for RW management. Pt unable to void urine despite attempt. She returned to supine in bed. Pt was left with all needs met, bed alarm set.   Therapy Documentation Precautions:  Precautions Precautions: Fall Precaution Comments: watch BP, dizziness Restrictions Weight Bearing Restrictions: No   Therapy/Group: Individual Therapy  Curtis Sites 11/28/2019, 6:57 AM

## 2019-11-28 NOTE — Progress Notes (Signed)
Physical Therapy Session Note  Patient Details  Name: [name] MRN: [id number] Date of Birth: September 20, 1931  Today's Date: 11/28/2019 PT Individual Time: 1300-1400 PT Individual Time Calculation (min): 60 min   Short Term Goals: Week 1:  PT Short Term Goal 1 (Week 1): Pt will perform supine<>sit with supervision PT Short Term Goal 2 (Week 1): Pt will perform sit<>stands using LRAD with CGA PT Short Term Goal 3 (Week 1): Pt will perform stand pivot transfers using LRAD with CGA PT Short Term Goal 4 (Week 1): Pt will ambulate at least 166f using LRAD with CGA  Skilled Therapeutic Interventions/Progress Updates:   Pt received supine in bed and agreeable to PT. Supine>sit transfer with supervision assist and cues for BLE management to floor. PT assisted pt to don pants at EOB with min assist to pull to waist once in standing.   Dynamic balance training using wii resort and wii fit. Pt performed 5 frames of bowling with 1 UE support on  RW. Pt then engaged in wii table tilt and penguin slide x 1 bout each. Min assist from PT to improve COM control and improve transfer from L to R.   Pt then performed gait training 1250fwith RW and supervision assist. Min cues from PT for improved RW control in turns to keep COM within BOS to prevent forward/lateral LOB.   Pt required multiple prolonged therapeutic rest breaks due to fatigue. Pt returned to room and performed stand pivot transfer to bed with supervision assist and RW. Sit>supine completed with supervision assist and increased time, and left supine in bed with call bell in reach and all needs met.           Therapy Documentation Precautions:  Precautions Precautions: Fall Precaution Comments: watch BP, dizziness Restrictions Weight Bearing Restrictions: No Pain: Pain Assessment Pain Score: 0-No pain    Therapy/Group: Individual Therapy  AuLorie Phenix1/03/2019, 2:08 PM

## 2019-11-28 NOTE — Patient Care Conference (Signed)
Inpatient RehabilitationTeam Conference and Plan of Care Update Date: 11/28/2019   Time: 10:15 AM    Patient Name: [name]      Medical Record Number: [id number]  Date of Birth: Jan 06, 1932 Sex: Female         Room/Bed: 4W10C/4W10C-01 Payor Info: Payor: MEDICARE / Plan: MEDICARE PART A AND B / Product Type: *No Product type* /    Admit Date/Time:  11/23/2019  1:34 PM  Primary Diagnosis:  Acute ischemic right PCA stroke [facility])  Hospital Problems: Principal Problem:   Acute ischemic right PCA stroke ([place]) s/p tPA and mechanical thrombectomy, d/t AF not on [facility]    Expected Discharge Date: Expected Discharge Date: 12/06/19  Team Members Present: Physician leading conference: Dr. Alysia Penna Care Coodinator Present: Dorien Chihuahua, RN, BSN, CRRN;Christina Sampson Goon, Crane Nurse Present: Mohammed Kindle, RN PT Present: Phylliss Bob, PTA OT Present: Laverle Hobby, OT SLP Present: Jettie Booze, CF-SLP PPS Coordinator present : Ileana Ladd, Burna Mortimer, SLP     Current Status/Progress Goal Weekly Team Focus  Bowel/Bladder   Continent of B/B however, Pt wears bladder pads d/t urgency and some incontinence associated with the urgency. LBM 11/02  pt will remain continent with normal bowel pattern. Less episodes of incontinence in pads.  time toilet q2-3 hours d/t urgency and prn laxatives prn   Swallow/Nutrition/ Hydration             ADL's   UB bathing/dressing S, LB bathing/dressing Min guard, toilet transfers and toileting Min guard without AD.  S  Dynamic standing balance, ADL transfers, BUE strengthening, self-care re-education, family ed.   Mobility   minA bed mobility, CGA STS, minA gait with RW and HHA up to 71ft, easily fatigued  supervision overall  dynamics balance, balance with narrow BOS, BLE strengthening, d/c planning   Communication             Safety/Cognition/ Behavioral Observations  Min A  Min-Supervision  medication/time management, emergent awareness,  recall daily info, attention during higher level tasks   Pain   Pt has pain in Right groin when ambulating but does not wish to take any analgesics.  Reduced pain with movement. Educate on analgesics and encourage to take prior to therapy for improved sessions.  assess pt for pain qshift and prn   Skin   Pt's right anterior upper thigh bruised, edematous, and firm to touch. Pt has moderate+ pain with movement or with touch to the area.There is a  surgical stab wound with skin glue that is healing. Pt has abrasion on right forearm with foam. Pt  Continued healing of right groin and to abrasion. Skin will have no further breakdown or infections.  assess skin qshift and prn     Discharge Planning:  Discharging home with spouse and 4 childrent. Daughter [name] will be primary, avaliable 24/7   Team Discussion: MD to adjust bowel medication, TTEDs for BP issues. Neuropsych referral for anxiety, pain in groin managed with prn medication. Trouble with balance due to narrow base of support. Note issues with complex problem solving, attention and awareness Patient on target to meet rehab goals: yes, supervision goals set  *See Care Plan and progress notes for long and short-term goals.   Revisions to Treatment Plan:    Teaching Needs: Transfers, toileting, medications, etc.  Current Barriers to Discharge: None  Possible Resolutions to Barriers:     Medical Summary Current Status: Patient complains of frequent stooling on laxatives yesterday he also complained of an  episode of low blood pressure although none noted by nursing.  Barriers to Discharge: Medical stability   Possible Resolutions to Barriers/Weekly Focus: Continue monitor blood pressure and heart rate, adjust laxatives   Continued Need for Acute Rehabilitation Level of Care: The patient requires daily medical management by a physician with specialized training in physical medicine and rehabilitation for the following  reasons: Direction of a multidisciplinary physical rehabilitation program to maximize functional independence : Yes Medical management of patient stability for increased activity during participation in an intensive rehabilitation regime.: Yes Analysis of laboratory values and/or radiology reports with any subsequent need for medication adjustment and/or medical intervention. : Yes   I attest that I was present, lead the team conference, and concur with the assessment and plan of the team.   Dorien Chihuahua B 11/28/2019, 12:57 PM

## 2019-11-29 ENCOUNTER — Inpatient Hospital Stay (HOSPITAL_COMMUNITY): Payer: Medicare Other | Admitting: Speech Pathology

## 2019-11-29 ENCOUNTER — Inpatient Hospital Stay (HOSPITAL_COMMUNITY): Payer: Medicare Other

## 2019-11-29 ENCOUNTER — Inpatient Hospital Stay (HOSPITAL_COMMUNITY): Payer: Medicare Other | Admitting: *Deleted

## 2019-11-29 ENCOUNTER — Inpatient Hospital Stay (HOSPITAL_COMMUNITY): Payer: Medicare Other | Admitting: Physical Therapy

## 2019-11-29 ENCOUNTER — Inpatient Hospital Stay: Payer: Medicare Other

## 2019-11-29 DIAGNOSIS — Z23 Encounter for immunization: Secondary | ICD-10-CM

## 2019-11-29 LAB — GLUCOSE, CAPILLARY
Glucose-Capillary: 117 mg/dL — ABNORMAL HIGH (ref 70–99)
Glucose-Capillary: 97 mg/dL (ref 70–99)

## 2019-11-29 NOTE — Plan of Care (Signed)
°  Problem: RH KNOWLEDGE DEFICIT Goal: RH STG INCREASE KNOWLEDGE OF STROKE PROPHYLAXIS Description: Pt will be able to demonstrate understanding of medication regimen, dietary and lifestyle modifications to better control blood pressure and prevent further stroke with mod I assist using handouts and booklet provided. 11/29/2019 1444 by Celesta Aver, LPN Outcome: Progressing   Problem: RH KNOWLEDGE DEFICIT Goal: RH STG INCREASE KNOWLEDGE OF HYPERTENSION Description: Pt will be able to demonstrate understanding of medication regimen, dietary and lifestyle modifications to better control blood pressure and prevent further stroke with mod I assist using handouts and booklet provided. 11/29/2019 1444 by Celesta Aver, LPN Outcome: Progressing 11/29/2019 1443 by Celesta Aver, LPN Outcome: Progressing 11/29/2019 1443 by Celesta Aver, LPN Outcome: Progressing

## 2019-11-29 NOTE — Progress Notes (Signed)
Physical Therapy Session Note  Patient Details  Name: ALAISA MOFFITT MRN: [id number] Date of Birth: 12-14-1931  Today's Date: 11/29/2019 PT Individual Time: 0900-0930 PT Individual Time Calculation (min): 30 min   Short Term Goals: Week 1:  PT Short Term Goal 1 (Week 1): Pt will perform supine<>sit with supervision PT Short Term Goal 2 (Week 1): Pt will perform sit<>stands using LRAD with CGA PT Short Term Goal 3 (Week 1): Pt will perform stand pivot transfers using LRAD with CGA PT Short Term Goal 4 (Week 1): Pt will ambulate at least 151ft using LRAD with CGA Week 2:    Week 3:     Skilled Therapeutic Interventions/Progress Updates:    PAIN no c/o during session.  Activity to tolerance.  Pt seen this am for 30 min session w/focus on gait, dynamic balance.  Pt initially supine, requires verbal cues for sequencing w/supine to sit then performs w/supervision.   Dons pants w/cues and min assist. Gait trials: Sit to stand w/cga and gait x 126ft w/RW and cga.  Decreased step length R>L but overall much improved from admission.  Gait 28ft without AD w/cga, significantly decreased cadence, increased shuffling tendency, decreased stability.  Balance:  Static stand on foam with RW and supervision, single UE support, cga, w/no UE support min assist, increased sway, pt very anxious and fearful of falling w/activity.  Rests in sitting between efforts due to stress w/activity.   Gait: 165ft w/RW and supervision.   Turn/sit to bed w/supervision.  Sit to supine w/supervision. Pt left supine w/rails up x 4, alarm set, bed in lowest position, and needs in reach.  Daughter at bedside.    Therapy Documentation Precautions:  Precautions Precautions: Fall Precaution Comments: watch BP, dizziness Restrictions Weight Bearing Restrictions: No    Therapy/Group: Individual Therapy  Callie Fielding, PT   Jerrilyn Cairo 11/29/2019, 12:36 PM

## 2019-11-29 NOTE — Progress Notes (Signed)
[place] PHYSICAL MEDICINE & REHABILITATION PROGRESS NOTE   Subjective/Complaints: No complaints this morning. Denies pain, constipation, insomnia.  Says daughter is here and wants to speak with me Feels therapy is going well.   ROS: Patient denies CP, SOB, N/V/D    Objective:   No results found. No results for input(s): WBC, HGB, HCT, PLT in the last 72 hours. Recent Labs    11/26/19 0841  NA 139  K 3.6  CL 105  CO2 23  GLUCOSE 146*  BUN 12  CREATININE 0.79  CALCIUM 8.5*    Intake/Output Summary (Last 24 hours) at 11/29/2019 0805 Last data filed at 11/28/2019 1700 Gross per 24 hour  Intake 310 ml  Output --  Net 310 ml        Physical Exam: Vital Signs Blood pressure (!) 117/50, pulse 72, temperature 97.8 F (36.6 C), temperature source Oral, resp. rate 16, height 5' (1.524 m), weight 63.2 kg, SpO2 95 %. General: Alert, No apparent distress HEENT: Head is normocephalic, atraumatic, PERRLA, EOMI, sclera anicteric, oral mucosa pink and moist, dentition intact, ext ear canals clear,  Neck: Supple without JVD or lymphadenopathy Heart: Reg rate and rhythm. No murmurs rubs or gallops Chest: CTA bilaterally without wheezes, rales, or rhonchi; no distress Abdomen: Soft, non-tender, non-distended, bowel sounds positive. Extremities: No clubbing, cyanosis, or edema. Pulses are 2+ Skin: Clean and intact without signs of breakdown  Neuro: Pt is cognitively appropriate with normal insight, memory, and awareness. Cranial nerves 2-12 are intact. Sensory exam is normal. Reflexes are 2+ in all 4's. Fine motor coordination is intact. No tremors. Motor function is grossly 5/5 UE. LLE 5-/5. RLE 4- to 4/5---pain inhibition RLE Musculoskeletal: Full ROM, No pain with AROM or PROM in the neck, trunk, or extremities. Posture appropriate       Assessment/Plan: 1. Functional deficits secondary to right cerebellar, frontal and thalamic infarcts with [facility] which require 3+ hours per  day of interdisciplinary therapy in a comprehensive inpatient rehab setting.  Physiatrist is providing close team supervision and 24 hour management of active medical problems listed below.  Physiatrist and rehab team continue to assess barriers to discharge/monitor patient progress toward functional and medical goals  Care Tool:  Bathing    Body parts bathed by patient: Right arm, Left arm, Chest, Abdomen, Front perineal area, Buttocks, Right upper leg, Left upper leg, Right lower leg, Left lower leg, Face         Bathing assist Assist Level: Contact Guard/Touching assist     Upper Body Dressing/Undressing Upper body dressing   What is the patient wearing?: Pull over shirt    Upper body assist Assist Level: Supervision/Verbal cueing    Lower Body Dressing/Undressing Lower body dressing      What is the patient wearing?: Pants     Lower body assist Assist for lower body dressing: Contact Guard/Touching assist     Toileting Toileting    Toileting assist Assist for toileting: Contact Guard/Touching assist     Transfers Chair/bed transfer  Transfers assist     Chair/bed transfer assist level: Minimal Assistance - Patient > 75%     Locomotion Ambulation   Ambulation assist      Assist level: Minimal Assistance - Patient > 75% Assistive device: Hand held assist Max distance: 82ft   Walk 10 feet activity   Assist     Assist level: Minimal Assistance - Patient > 75% Assistive device: Hand held assist   Walk 50 feet activity  Assist    Assist level: Minimal Assistance - Patient > 75% Assistive device: Hand held assist    Walk 150 feet activity   Assist Walk 150 feet activity did not occur: Safety/medical concerns         Walk 10 feet on uneven surface  activity   Assist     Assist level: Minimal Assistance - Patient > 75% (ramp) Assistive device: Hand held assist, Other (comment) (railing)   Wheelchair     Assist Will  patient use wheelchair at discharge?: No             Wheelchair 50 feet with 2 turns activity    Assist            Wheelchair 150 feet activity     Assist          Blood pressure (!) 117/50, pulse 72, temperature 97.8 F (36.6 C), temperature source Oral, resp. rate 16, height 5' (1.524 m), weight 63.2 kg, SpO2 95 %.  Medical Problem List and Plan: 1.  Deficits with mobility, endurance, self-care secondary to acute infarct in right cerebellum, right frontal cortex, right thalamus with mild SAH.             -patient may shower             -ELOS/Goals: 7-12 days/mod I/supervision             --Continue CIR therapies including PT, OT, and SLP  2.  Antithrombotics: -DVT/anticoagulation:  Pharmaceutical: Other (comment)--Elquis             -antiplatelet therapy: N/A 3. Right groin pain/Pain Management: Tylenol prn secondary to hematoma. Well controlled 4. Mood: LCSW to follow for evaluation and support.              -antipsychotic agents: N/A 5. Neuropsych: This patient is capable of making decisions on her own behalf. 6. Skin/Wound Care: local care to right groin. Routine pressure relief measures.  7. Fluids/Electrolytes/Nutrition: Encourage intake.  CMP ordered.I: 738ml 8. HQR:FXJOITG BP tid    Vitals:   11/28/19 1918 11/29/19 0437  BP: (!) 150/70 (!) 117/50  Pulse: 74 72  Resp:    Temp: 98.1 F (36.7 C) 97.8 F (36.6 C)  SpO2: 100% 95%  controlled 11/4 9. A fib: HR controlled off metoprolol.  Eliquis resumed 10/28.              Heart rates 70s and 80s 10. Right groin pseudoaneurysm: Still has hematoma which is improving 11. Acute blood loss anemia:              CBC ordered. 12.  Chronic UTIs/urinary frequency: Resumed suppressive therapy.  -PVR scan yesterday was 76cc  -purewick catheter for HS frequency very helpful 13. Slow transit constipation: managed with colace and miralax.              Adjust all meds as necessary   -Having regular BM 14.  ?  PNA some pleural effusions an dchronic interstitial changes on xray , no leukocytosis, afebrile without cough LOS: 6 days A FACE TO FACE EVALUATION WAS PERFORMED  Ermelinda Eckert P Taletha Twiford 11/29/2019, 8:05 AM

## 2019-11-29 NOTE — Progress Notes (Signed)
Speech Language Pathology Daily Session Note  Patient Details  Name: [name] MRN: [id number] Date of Birth: 1931-12-17  Today's Date: 11/29/2019 SLP Individual Time: 1300-1355 SLP Individual Time Calculation (min): 55 min  Short Term Goals: Week 1: SLP Short Term Goal 1 (Week 1): Pt will recall daily events and novel information with supervision A verbal cues for use of external/internal aids. SLP Short Term Goal 2 (Week 1): Pt will complete basic-mildly complex problem solving tasks with min A verbal cues. SLP Short Term Goal 3 (Week 1): Pt will self-monitory and self-correct errors during problem solving tasks with mod A verbal cues. SLP Short Term Goal 4 (Week 1): Pt will demonstrate sustained attention in 15 minute intervals during mildly complex problem solving tasks with supervision A verbal cues for redirection.  Skilled Therapeutic Interventions: Pt was seen for skilled ST targeting cognitive skills. SLP facilitated session with Mod-Max A verbal and visual cues for problem solving and error awareness during a semi-complex checkbook balancing task that is most similar to pt's balancing system at home - she rounds to the next even dollar. Pt stated good awareness of the level of difficulty this task presented and stated "I better not do my checkbook myself for awhile". Validation but also encouragement provided to continue to work on these skills to increase accuracy in future sessions. Pt also completed a semi-complex monthly scheduling/calendar task that was started in yesterday's session with overall Min A cues required for organization, problem solving, and recall. Pt reported mental and physical fatigue at end of session. She was returned to room and transferred back to bed, left with alarm set and needs within reach, daughter present at bedside. Continue per current plan of care.          Pain Pain Assessment Pain Scale: 0-10 Pain Score: 0-No pain  Therapy/Group: Individual  Therapy  Arbutus Leas 11/29/2019, 1:58 PM

## 2019-11-29 NOTE — Evaluation (Signed)
Recreational Therapy Assessment and Plan  Patient Details  Name: [name] MRN: [id number] Date of Birth: [birth year] Today's Date: 11/29/2019  Rehab Potential:   Good ELOS:  D/c 11/11  Hospital Problem: Principal Problem:   Acute ischemic right PCA stroke ([place]) s/p tPA and mechanical thrombectomy, d/t AF not on [facility]   Past Medical History:      Past Medical History:  Diagnosis Date  . Atrial fibrillation ([place])    Prior treatment with amiodarone, held sinus rhythm and drug stopped  . Atrial flutter ([place])    Status post ablation prior to  2009  . Chest pain    Nuclear, 2004, no scar or ischemia, EF 77%  . Ejection fraction    EF 70%, nuclear,  2004  //     . Fall    July, 2014  . H/O bladder repair surgery   . Right Achilles tendinitis   . Sciatica    Past Surgical History:       Past Surgical History:  Procedure Laterality Date  . IR CT HEAD LTD  11/14/2019  . IR PERCUTANEOUS ART THROMBECTOMY/INFUSION INTRACRANIAL INC DIAG ANGIO  11/14/2019  . IR US GUIDE VASC ACCESS RIGHT  11/14/2019  . no surgical hx    . RADIOLOGY WITH ANESTHESIA N/A 11/13/2019   Procedure: IR WITH ANESTHESIA;  Surgeon: Luanne Bras, MD;  Location: [place];  Service: Radiology;  Laterality: N/A;    Assessment & Plan Clinical Impression: Patient is a 86 y.o. year old female with history of A fib/flutter s/p flutter--off AC due to NSR, recent treatment for PNA who was admitted on 11/13/2019 with left hemiparesis and right gaze preference. CTA head unremarkable for acute intracranial process and CTA showed proximal Right PCA occlusion with emphysema and layering left pleural effusion c/w PNA. She underwent cerebral angio with revascularization of R-PCA by Dr. Earleen Newport. MRI/MRA brain done revealing right thalamic infarct with mild SAH in interpeduncular cistern and small areas of acute infarct in right cerebellum and right frontal cortex. Echocardiogram with EF of 60-65% with  severe asymmetric basal septal hypertrophy of LV and mild aortic sclerosis with trivial regurgitation. She completed 7 day course of Augmentin for PNA on 10/26.   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 She underwent ultrasound guided trhombin injection of R-CFA with good results on 10/22. Eliquis added on 10/24. She continued to have progressive drop in H/H 13.3-->10.5-->8.8 with dizziness and nausea and reported worsening of right groin pain on 10/29. CTA abdomen/pelvis showed recurrent pseudoaneurysm. Dr. Scot Dock consulted for input and recommended repeat thrombin injection which was performed by IR on 10/27. Post procedure scan ultrasound the next day showed closed pseudoaneurysm and Eliquis resumed yesterday. Therapy has been ongoing and CIR recommended due to functional decline.  Patient transferred to CIR on 11/23/2019 .    Met with pt and daughter today to discuss TR services, leisure interests, activity analysis/modifications & coping strategies.  Pt is appreciative of the care she is receiving.  Pt acknowledges that she is progressing but is frustrated with loss of independence.  Emotional support/encouragement provided.  Pt reports her faith/faith community & prayer are important coping strategies. Pt appreciative of this visit.  Plan  Min 1 TR session >20 minutes during LOS   Recommendations for other services: Neuropsych  Discharge Criteria: Patient will be discharged from TR if patient refuses treatment 3 consecutive times without medical reason.  If treatment goals not met, if there is a change in  medical status, if patient makes no progress towards goals or if patient is discharged from hospital.  The above assessment, treatment plan, treatment alternatives and goals were discussed and mutually agreed upon: by patient  [name] 11/29/2019, 3:40 PM

## 2019-11-29 NOTE — Progress Notes (Signed)
   Covid-19 Vaccination Clinic  Name:  SHYA KOVATCH    MRN: [id number] DOB: 1931-06-01  11/29/2019  Ms. Trillo was observed post Covid-19 immunization for 30 minutes based on pre-vaccination screening without incident. She was provided with Vaccine Information Sheet and instruction to access the V-Safe system.   Ms. Tall was instructed to call 911 with any severe reactions post vaccine: Marland Kitchen Difficulty breathing  . Swelling of face and throat  . A fast heartbeat  . A bad rash all over body  . Dizziness and weakness

## 2019-11-29 NOTE — Progress Notes (Signed)
ANTICOAGULATION CONSULT NOTE- follow up   Pharmacy Consult for apixaban Indication: atrial fibrillation  Labs: No results for input(s): HGB, HCT, PLT, APTT, LABPROT, INR, HEPARINUNFRC, HEPRLOWMOCWT, CREATININE, CKTOTAL, CKMB, TROPONINI in the last 72 hours.  Assessment: 79 yof with hx afib/aflutter s/p ablation, off anticoagulation PTA presenting with acute ischemic stroke, s/p TPA 10/19 PM. Noted, no anticoagulation initially started with small SAH s/p TPA and recurrent R groin pseudoaneurysm s/p thrombin injection with obliteration in IR 10/27.  Pharmacy consulted by Neurology to resume apixaban 10/28. R groin hematoma improving per MD note.  SCr wnl, Hg low stable 10.2,, plt wnl on 10/30.  MD is checking weekly CBC and BMET due 11/8. No current active bleed issues reported.  Goal of Therapy:  Stroke prevention Monitor platelets by anticoagulation protocol: Yes   Plan:  Apixaban 5mg  PO BID Monitor CBC, s/sx bleeding   Nicole Cella, RPh Clinical Pharmacist [phone] Please check AMION for all [place] phone numbers After 10:00 PM, call [place] [phone] 11/29/2019 9:58 AM

## 2019-11-29 NOTE — Progress Notes (Signed)
Physical Therapy Session Note  Patient Details  Name: [name] MRN: [id number] Date of Birth: [birth year]  Today's Date: 11/29/2019 PT Individual Time: 1108-1205 PT Individual Time Calculation (min): 57 min   Short Term Goals: Week 1:  PT Short Term Goal 1 (Week 1): Pt will perform supine<>sit with supervision PT Short Term Goal 2 (Week 1): Pt will perform sit<>stands using LRAD with CGA PT Short Term Goal 3 (Week 1): Pt will perform stand pivot transfers using LRAD with CGA PT Short Term Goal 4 (Week 1): Pt will ambulate at least 133f using LRAD with CGA  Skilled Therapeutic Interventions/Progress Updates:   Pt received supine in bed and agreeable to PT. Supine>sit transfer with supervision assist and cues for symmetry of BLE sitting EOB. [facility], no LOB noted.   Pt transported to rehab gym. Stair management training with BUE support on rails and step over step gait pattern. Completed 2 x 4 with supervision assist from PT.   Dynamic balance training from level surface Eyes open/eyes closed. 2 x 10sec Narrow BOS x 1 min  semitandem stance with Fingertip support from BUE 360deg turns x 2  Trunk rotation to look over shoulder x 3 Bil  Pick ball up from floor with BUE and then perform over head press x 2  Forward reach with 1 UE to tap ball at ~10 inches x 3 BUE.  CGA-supervision assist from PT throughout; no overt LOB noted, and cues for increased step width intermittently as appropriate and to improve midline orientation to increased weight shift R.   Biodex dynamic balance training AP/lateral weight shifting with BUE support, 1 min each with 80-95% accuracy. LOS with BUE support in all directions, 50% accuracy x 1 min and then Rside only with no UE support. 30% accuracy with no UE support. Cues throughout to improve weight shift R and use of ankle strategy to improve COM control. Min assist to step up/down 8 inch step onto biodex.   Patient  [facility] to [facility] and all needs met.         Therapy Documentation Precautions:  Precautions Precautions: Fall Precaution Comments: watch BP, dizziness Restrictions Weight Bearing Restrictions: No   Pain: denies   Therapy/Group: Individual Therapy  ALorie Phenix11/04/2019, 1:04 PM

## 2019-11-29 NOTE — Progress Notes (Signed)
ANTICOAGULATION CONSULT NOTE- follow up   Pharmacy Consult for apixaban Indication: atrial fibrillation  Labs: No results for input(s): HGB, HCT, PLT, APTT, LABPROT, INR, HEPARINUNFRC, HEPRLOWMOCWT, CREATININE, CKTOTAL, CKMB, TROPONINI in the last 72 hours.  Assessment: 54 yof with hx afib/aflutter s/p ablation, off anticoagulation PTA presenting with acute ischemic stroke, s/p TPA 10/19 PM. Noted, no anticoagulation initially started with small SAH s/p TPA and recurrent R groin pseudoaneurysm s/p thrombin injection with obliteration in IR 10/27.  Pharmacy consulted by Neurology to resume apixaban 10/28. R groin hematoma improving per MD note.  SCr wnl, Hg low stable 10.2, plt wnl on 10/30.  MD is checking weekly CBC and BMET due 11/8. No current active bleed issues reported.  Goal of Therapy:  Stroke prevention Monitor platelets by anticoagulation protocol: Yes   Plan:  Apixaban 5mg  PO BID Monitor CBC, s/sx bleeding Apixaban education completed 11/18/19 (before CIR admit) with patient and family member.    Nicole Cella, RPh Clinical Pharmacist [phone] Please check AMION for all [place] phone numbers After 10:00 PM, call [place] [phone] 11/29/2019 10:07 AM

## 2019-11-29 NOTE — Progress Notes (Signed)
Occupational Therapy Session Note  Patient Details  Name: [name] MRN: [id number] Date of Birth: 1931-09-20  Today's Date: 11/29/2019 OT Individual Time: 1445-1530 OT Individual Time Calculation (min): 45 min    Short Term Goals: Week 1:  OT Short Term Goal 1 (Week 1): Pt will tolerate 1 hour session wiht no changes in vitals to demo improved activity tolerance/medical stability OT Short Term Goal 2 (Week 1): Pt Will transfer to toilet wiht CGA OT Short Term Goal 3 (Week 1): Pt will thread BLE into LB clothing wiht S OT Short Term Goal 4 (Week 1): Pt will maintain dynamic sitting balance at EOB/EOM wiht S OT Short Term Goal 5 (Week 1): Pt will compelte peri hygeine with S  Skilled Therapeutic Interventions/Progress Updates:     Pt received in bed agreeable to OT with encouragement from daughter as pt "finally warm"  ADL:  Pt completes toileting with CGA for standing balance. Pt requires A to change underwear and pad. Pt completes toileting transfer with CGA with RW and VC for RW management during ambulatory transfers.  Therapeutic activity Pt completes standing balance activity while completing graded pipe tree. Pt requires CGA for standing on compliant surface at high low table and MOD A to problem solve pipe pieces.   Pt left at end of session in recliner with exit alarm on, call light in reach and all needs met   Therapy Documentation Precautions:  Precautions Precautions: Fall Precaution Comments: watch BP, dizziness Restrictions Weight Bearing Restrictions: No General:   Vital Signs:  Pain:   ADL: ADL Eating: Supervision/safety Where Assessed-Eating: Bed level Grooming: Supervision/safety Where Assessed-Grooming: Edge of bed Upper Body Bathing: Supervision/safety Where Assessed-Upper Body Bathing: Edge of bed Lower Body Bathing: Minimal assistance Where Assessed-Lower Body Bathing: Edge of bed Upper Body Dressing: Supervision/safety Where  Assessed-Upper Body Dressing: Edge of bed Lower Body Dressing: Minimal assistance Where Assessed-Lower Body Dressing: Edge of bed Toileting: Moderate assistance Where Assessed-Toileting: Glass blower/designer: Psychiatric nurse Method: Arts development officer: Bedside commode, Raised toilet seat, Grab bars Vision   Perception    Praxis   Exercises:   Other Treatments:     Therapy/Group: Individual Therapy  Tonny Branch 11/29/2019, 12:28 PM

## 2019-11-30 ENCOUNTER — Inpatient Hospital Stay (HOSPITAL_COMMUNITY): Payer: Medicare Other | Admitting: Occupational Therapy

## 2019-11-30 ENCOUNTER — Inpatient Hospital Stay (HOSPITAL_COMMUNITY): Payer: Medicare Other | Admitting: Speech Pathology

## 2019-11-30 ENCOUNTER — Inpatient Hospital Stay (HOSPITAL_COMMUNITY): Payer: Medicare Other | Admitting: Physical Therapy

## 2019-11-30 LAB — URINALYSIS, ROUTINE W REFLEX MICROSCOPIC
Bilirubin Urine: NEGATIVE
Glucose, UA: NEGATIVE mg/dL
Ketones, ur: NEGATIVE mg/dL
Nitrite: POSITIVE — AB
Protein, ur: 30 mg/dL — AB
Specific Gravity, Urine: 1.021 (ref 1.005–1.030)
WBC, UA: >50 WBC/hpf — ABNORMAL HIGH (ref 0–5)
pH: 5 (ref 5.0–8.0)

## 2019-11-30 LAB — GLUCOSE, CAPILLARY
Glucose-Capillary: 113 mg/dL — ABNORMAL HIGH (ref 70–99)
Glucose-Capillary: 101 mg/dL — ABNORMAL HIGH (ref 70–99)

## 2019-11-30 MED ORDER — CEPHALEXIN 250 MG PO CAPS
500.0000 mg | ORAL_CAPSULE | Freq: Two times a day (BID) | ORAL | Status: DC
  Administered 2019-11-30 – 2019-12-04 (×8): 500 mg via ORAL
  Filled 2019-11-30 (×8): qty 2

## 2019-11-30 NOTE — Progress Notes (Signed)
[place] PHYSICAL MEDICINE & REHABILITATION PROGRESS NOTE   Subjective/Complaints:  Pt feels like UTI is "coming on"  Has hx of UTI, Sees urology , Dr Billee Cashing Diarmid as OP, was on some med to prevent UTI at home  ROS: Patient denies CP, SOB, N/V/D    Objective:   No results found. No results for input(s): WBC, HGB, HCT, PLT in the last 72 hours. No results for input(s): NA, K, CL, CO2, GLUCOSE, BUN, CREATININE, CALCIUM in the last 72 hours.  Intake/Output Summary (Last 24 hours) at 11/30/2019 1045 Last data filed at 11/30/2019 0805 Gross per 24 hour  Intake 360 ml  Output --  Net 360 ml        Physical Exam: Vital Signs Blood pressure (!) 146/77, pulse 78, temperature 97.7 F (36.5 C), temperature source Oral, resp. rate 15, height 5' (1.524 m), weight 63.2 kg, SpO2 97 %.  General: No acute distress Mood and affect are appropriate Heart: Regular rate and rhythm no rubs murmurs or extra sounds Lungs: Clear to auscultation, breathing unlabored, no rales or wheezes Abdomen: Positive bowel sounds, soft nontender to palpation, nondistended Extremities: No clubbing, cyanosis, or edema Skin: No evidence of breakdown, no evidence of rash Neurologic: Cranial nerves II through XII intact, motor strength is 4/5 in bilateral deltoid, bicep, tricep, grip, hip flexor, knee extensors, ankle dorsiflexor and plantar flexor   Musculoskeletal: Full ROM, No pain with AROM or PROM in the neck, trunk, or extremities. Posture appropriate       Assessment/Plan: 1. Functional deficits secondary to right cerebellar, frontal and thalamic infarcts with [facility] which require 3+ hours per day of interdisciplinary therapy in a comprehensive inpatient rehab setting.  Physiatrist is providing close team supervision and 24 hour management of active medical problems listed below.  Physiatrist and rehab team continue to assess barriers to discharge/monitor patient progress toward functional and medical  goals  Care Tool:  Bathing    Body parts bathed by patient: Right arm, Left arm, Chest, Abdomen, Front perineal area, Buttocks, Right upper leg, Left upper leg, Right lower leg, Left lower leg, Face         Bathing assist Assist Level: Contact Guard/Touching assist     Upper Body Dressing/Undressing Upper body dressing   What is the patient wearing?: Pull over shirt    Upper body assist Assist Level: Supervision/Verbal cueing    Lower Body Dressing/Undressing Lower body dressing      What is the patient wearing?: Pants     Lower body assist Assist for lower body dressing: Contact Guard/Touching assist     Toileting Toileting    Toileting assist Assist for toileting: Contact Guard/Touching assist     Transfers Chair/bed transfer  Transfers assist     Chair/bed transfer assist level: Supervision/Verbal cueing     Locomotion Ambulation   Ambulation assist      Assist level: Supervision/Verbal cueing Assistive device: Walker-rolling Max distance: 150   Walk 10 feet activity   Assist     Assist level: Supervision/Verbal cueing Assistive device: Walker-rolling   Walk 50 feet activity   Assist    Assist level: Supervision/Verbal cueing Assistive device: Walker-rolling    Walk 150 feet activity   Assist Walk 150 feet activity did not occur: Safety/medical concerns  Assist level: Supervision/Verbal cueing Assistive device: Walker-rolling    Walk 10 feet on uneven surface  activity   Assist     Assist level: Minimal Assistance - Patient > 75% (ramp) Assistive device: Hand  held assist, Other (comment) (railing)   Wheelchair     Assist Will patient use wheelchair at discharge?: No             Wheelchair 50 feet with 2 turns activity    Assist            Wheelchair 150 feet activity     Assist          Blood pressure (!) 146/77, pulse 78, temperature 97.7 F (36.5 C), temperature source Oral, resp.  rate 15, height 5' (1.524 m), weight 63.2 kg, SpO2 97 %.  Medical Problem List and Plan: 1.  Deficits with mobility, endurance, self-care secondary to acute infarct in right cerebellum, right frontal cortex, right thalamus with mild SAH.             -patient may shower             -ELOS/Goals: 7-12 days/mod I/supervision             --Continue CIR therapies including PT, OT, and SLP  2.  Antithrombotics: -DVT/anticoagulation:  Pharmaceutical: Other (comment)--Elquis             -antiplatelet therapy: N/A 3. Right groin pain/Pain Management: Tylenol prn secondary to hematoma. Well controlled 4. Mood: LCSW to follow for evaluation and support.              -antipsychotic agents: N/A 5. Neuropsych: This patient is capable of making decisions on her own behalf. 6. Skin/Wound Care: local care to right groin. Routine pressure relief measures.  7. Fluids/Electrolytes/Nutrition: Encourage intake.  CMP ordered.I: 764ml 8. HUO:HFGBMSX BP tid    Vitals:   11/29/19 1945 11/30/19 0502  BP: 138/68 (!) 146/77  Pulse: 75 78  Resp:    Temp: (!) 97.4 F (36.3 C) 97.7 F (36.5 C)  SpO2: 98% 97%  controlled 11/5 9. A fib: HR controlled off metoprolol.  Eliquis resumed 10/28.              Heart rates 70s and 80s 10. Right groin pseudoaneurysm: Still has hematoma which is improving 11. Acute blood loss anemia:              CBC ordered. 12.  Chronic UTIs/urinary frequency: increased sx recheck UA C and S  -PVR scan yesterday was 76cc  -purewick catheter for HS frequency very helpful 13. Slow transit constipation: managed with colace and miralax.              Adjust all meds as necessary   -Having regular BM 14.  ? PNA some pleural effusions and chronic interstitial changes on xray , no leukocytosis, afebrile without cough LOS: 7 days A FACE TO FACE EVALUATION WAS PERFORMED  Charlett Blake 11/30/2019, 10:45 AM

## 2019-11-30 NOTE — Progress Notes (Signed)
Speech Language Pathology Daily Session Note  Patient Details  Name: [name] MRN: [id number] Date of Birth: October 05, 1931  Today's Date: 11/30/2019 SLP Individual Time: 1100-1157 SLP Individual Time Calculation (min): 57 min  Short Term Goals: Week 1: SLP Short Term Goal 1 (Week 1): Pt will recall daily events and novel information with supervision A verbal cues for use of external/internal aids. SLP Short Term Goal 2 (Week 1): Pt will complete basic-mildly complex problem solving tasks with min A verbal cues. SLP Short Term Goal 3 (Week 1): Pt will self-monitory and self-correct errors during problem solving tasks with mod A verbal cues. SLP Short Term Goal 4 (Week 1): Pt will demonstrate sustained attention in 15 minute intervals during mildly complex problem solving tasks with supervision A verbal cues for redirection.  Skilled Therapeutic Interventions: Pt was seen for skilled ST targeting cognitive goals. Pt required overall Min verbal cues to identify errors/illogical entries within a calendar based error awareness task. Pt also required Supervision A verbal cues for verbal problem solving when making calculations given various functional time-based scenarios. She also demonstrated anticipatory awareness and reasoning throughout time task - Mod I. Pt recalled yesterday's session with Supervision A question cues and continued to express feeling "defeated" based on her performance with checkbook balancing task. Encouragement provided and will re-attempt task in another therapy session. She also expressed concern for being able to play a familiar card game H[place]) in same manner she did prior to CVA, therefore SLP encouraged her to verbally describe/teach SLP, which she did with Supervision-Min A question cues. Pt left laying in bed with alarm set and needs within reach, daughter present. Continue per current plan of care.          Pain Pain Assessment Pain Scale: Faces Faces Pain Scale:  No hurt  Therapy/Group: Individual Therapy  Arbutus Leas 11/30/2019, 12:03 PM

## 2019-11-30 NOTE — Progress Notes (Signed)
Physical Therapy Weekly Progress Note  Patient Details  Name: [name] MRN: [id number] Date of Birth: 02/07/1931   Beginning of progress report period: November 24, 2019 End of progress report period: November 30, 2019  Today's Date: 11/30/2019 PT Individual Time:    1332-1430  58 min Patient has met 4 of 4 short term goals.  Pt is making steady progress towards LTG. Pt has progressed to supervision assist-CGA for all movement including transfers, gait with RW or SPC, WC propulsion, and bed mobility. Pt continues to demonstrate shuffle gait pattern and poor foot clearance without AD, but able to correct with BUE support and/or cues for proper gait pattern.   Patient continues to demonstrate the following deficits muscle weakness and muscle joint tightness, decreased cardiorespiratoy endurance and decreased standing balance and decreased balance strategies and therefore will continue to benefit from skilled PT intervention to increase functional independence with mobility.  Patient progressing toward long term goals..  Continue plan of care.  PT Short Term Goals Week 1:  PT Short Term Goal 1 (Week 1): Pt will perform supine<>sit with supervision PT Short Term Goal 1 - Progress (Week 1): Met PT Short Term Goal 2 (Week 1): Pt will perform sit<>stands using LRAD with CGA PT Short Term Goal 2 - Progress (Week 1): Met PT Short Term Goal 3 (Week 1): Pt will perform stand pivot transfers using LRAD with CGA PT Short Term Goal 3 - Progress (Week 1): Met PT Short Term Goal 4 (Week 1): Pt will ambulate at least 163f using LRAD with CGA PT Short Term Goal 4 - Progress (Week 1): Met Week 2:  PT Short Term Goal 1 (Week 2): STG=LTG due ELOS  Skilled Therapeutic Interventions/Progress Updates:  Pt received supine in bed and agreeable to PT. Supine>sit transfer with supevision assist and cues for LE positioning once EOB. [facility] Gait training  without AD and HHA x 1552fthrough gift shop with min cues for improved step height intermittently. Gait training also performed with RW through gift shop x [id number] supervision assist. Noted to have improved step height and length consistently with AD vs no AD. Pt educated on possible need for AD and use in home to reduce fall risk. [facility] with rollator and SPC x [id number] with supervision assist. Noted noted to have improved posture and step height with SPC vs rollator. Patient returned to room and performed ambulatory transfer to recliner with SPC x [id number] rehab gym.  Pt left sitting in recliner with call bell in reach and all needs met.           Therapy Documentation Precautions:  Precautions Precautions: Fall Precaution Comments: watch BP, dizziness Restrictions Weight Bearing Restrictions: No Pain: Pain Assessment Pain Scale: Faces Faces Pain Scale: No hurt   Therapy/Group: Individual Therapy  AustiLorie Phenix/2021, 2:38 PM

## 2019-11-30 NOTE — Progress Notes (Signed)
Occupational Therapy Weekly Progress Note  Patient Details  Name: [name] MRN: [id number] Date of Birth: [birth year]  Beginning of progress report period: November 24, 2019 End of progress report period: November 30, 2019  Today's Date: 11/30/2019 OT Individual Time: 0848-1000 OT Individual Time Calculation (min): 72 min    Patient has met 4 of 5 short term goals. Patient making steady progress toward goals and is currently tolerating treatment sessions with no significant changes in vitals. Patient in able to perform ADL transfers with CGA progressing toward supervision A with and without use of RW. Upon initial evaluation, patient was requiring Min A for LB dressing tasks. Patient currently able to thread BLE through underwear/pants in unsupported sitting position with supervision A demonstrating improved dynamic sitting balance. CGA for balance only to hike pants over hips in standing.   Patient continues to demonstrate the following deficits: muscle weakness, decreased cardiorespiratoy endurance, impaired timing and sequencing and decreased coordination, decreased attention, decreased awareness, decreased problem solving, decreased safety awareness and decreased memory and decreased sitting balance, decreased standing balance, decreased postural control, hemiplegia and decreased balance strategies and therefore will continue to benefit from skilled OT intervention to enhance overall performance with BADL and iADL.  Patient progressing toward long term goals..  Continue plan of care.  OT Short Term Goals Week 1:  OT Short Term Goal 1 (Week 1): Pt will tolerate 1 hour session wiht no changes in vitals to demo improved activity tolerance/medical stability OT Short Term Goal 1 - Progress (Week 1): Met OT Short Term Goal 2 (Week 1): Pt Will transfer to toilet wiht CGA OT Short Term Goal 2 - Progress (Week 1): Met OT Short Term Goal 3 (Week 1): Pt will thread BLE into LB clothing wiht S OT  Short Term Goal 3 - Progress (Week 1): Met OT Short Term Goal 4 (Week 1): Pt will maintain dynamic sitting balance at EOB/EOM wiht S OT Short Term Goal 4 - Progress (Week 1): Met OT Short Term Goal 5 (Week 1): Pt will complete peri hygeine with S Week 2:  OT Short Term Goal 1 (Week 2): STG = LTG 2/2 ELOS  Skilled Therapeutic Interventions/Progress Updates:  Patient met lying supine in bed in agreement with OT treatment session. 0/10 pain at rest and with activity. Supine to EOB with HOB flat. Patient able to doff socks and don thigh high TED hose with Min-Mod A. Functional mobility to commode in bathroom, toilet transfer, and toileting/hygiene/clothing management with CGA for balance only. Patient declined bathing at shower level 2/2 claustrophobia. Education on alternative of bathing/dressing in ADL apartment. Patient in agreement with this option another day. Patient with preference for bathing seated at sink level this date. UB bathing/dressing with set-up A and LB dressing with CGA for balance only. Patient able to thread BLE through underwear and pants with S. Patient education on energy conservation techniques with good return demonstration. Dynamic sitting/standing balance with clothes pins with incorporation of [employer] in prep for BADLs. Session concluded with patient lying supine in bed with call bell within reach, bed alarm activated, and all needs met.   Therapy Documentation Precautions:  Precautions Precautions: Fall Precaution Comments: watch BP, dizziness Restrictions Weight Bearing Restrictions: No General:    Therapy/Group: Individual Therapy  Brayleigh Rybacki R [place]-Davis 11/30/2019, 7:29 AM

## 2019-12-01 LAB — GLUCOSE, CAPILLARY
Glucose-Capillary: 113 mg/dL — ABNORMAL HIGH (ref 70–99)
Glucose-Capillary: 105 mg/dL — ABNORMAL HIGH (ref 70–99)

## 2019-12-01 NOTE — Plan of Care (Signed)
  Problem: Consults Goal: RH STROKE PATIENT EDUCATION Description: See Patient Education module for education specifics  Outcome: Progressing   Problem: RH BLADDER ELIMINATION Goal: RH STG MANAGE BLADDER WITH ASSISTANCE Description: STG Manage Bladder With mod I Assistance Outcome: Progressing   Problem: RH SKIN INTEGRITY Goal: RH STG MAINTAIN SKIN INTEGRITY WITH ASSISTANCE Description: STG Maintain Skin Integrity With mod I Assistance. Outcome: Progressing   Problem: RH PAIN MANAGEMENT Goal: RH STG PAIN MANAGED AT OR BELOW PT'S PAIN GOAL Description: Pain level less than 4 on scale of 0-10 Outcome: Progressing   Problem: RH KNOWLEDGE DEFICIT Goal: RH STG INCREASE KNOWLEDGE OF HYPERTENSION Description: Pt will be able to demonstrate understanding of medication regimen, dietary and lifestyle modifications to better control blood pressure and prevent further stroke with mod I assist using handouts and booklet provided. Outcome: Progressing Goal: RH STG INCREASE KNOWLEDGE OF STROKE PROPHYLAXIS Description: Pt will be able to demonstrate understanding of medication regimen, dietary and lifestyle modifications to better control blood pressure and prevent further stroke with mod I assist using handouts and booklet provided. Outcome: Progressing

## 2019-12-01 NOTE — Progress Notes (Addendum)
[place] PHYSICAL MEDICINE & REHABILITATION PROGRESS NOTE   Subjective/Complaints: No complaints this morning. Denies pain, constipation, insomnia.  UTI symptoms improving Hematoma improving  ROS: Patient denies CP, SOB, N/V/D    Objective:   No results found. No results for input(s): WBC, HGB, HCT, PLT in the last 72 hours. No results for input(s): NA, K, CL, CO2, GLUCOSE, BUN, CREATININE, CALCIUM in the last 72 hours.  Intake/Output Summary (Last 24 hours) at 12/01/2019 1146 Last data filed at 12/01/2019 0830 Gross per 24 hour  Intake 700 ml  Output 100 ml  Net 600 ml        Physical Exam: Vital Signs Blood pressure (!) 142/80, pulse 70, temperature 97.7 F (36.5 C), resp. rate 18, height 5' (1.524 m), weight 63.2 kg, SpO2 97 %. General: Alert and oriented x 3, No apparent distress HEENT: Head is normocephalic, atraumatic, PERRLA, EOMI, sclera anicteric, oral mucosa pink and moist, dentition intact, ext ear canals clear,  Neck: Supple without JVD or lymphadenopathy Heart: Reg rate and rhythm. No murmurs rubs or gallops Chest: CTA bilaterally without wheezes, rales, or rhonchi; no distress Abdomen: Soft, non-tender, non-distended, bowel sounds positive. Extremities: No clubbing, cyanosis, or edema. Pulses are 2+  Skin: No evidence of breakdown, no evidence of rash Neurologic: Cranial nerves II through XII intact, motor strength is 4/5 in bilateral deltoid, bicep, tricep, grip, hip flexor, knee extensors, ankle dorsiflexor and plantar flexor   Musculoskeletal: Full ROM, No pain with AROM or PROM in the neck, trunk, or extremities. Posture appropriate       Assessment/Plan: 1. Functional deficits secondary to right cerebellar, frontal and thalamic infarcts with [facility] which require 3+ hours per day of interdisciplinary therapy in a comprehensive inpatient rehab setting.  Physiatrist is providing close team supervision and 24 hour management of active medical problems  listed below.  Physiatrist and rehab team continue to assess barriers to discharge/monitor patient progress toward functional and medical goals  Care Tool:  Bathing    Body parts bathed by patient: Right arm, Left arm, Chest, Abdomen, Front perineal area, Buttocks, Right upper leg, Left upper leg, Right lower leg, Left lower leg, Face         Bathing assist Assist Level: Contact Guard/Touching assist     Upper Body Dressing/Undressing Upper body dressing   What is the patient wearing?: Pull over shirt    Upper body assist Assist Level: Supervision/Verbal cueing    Lower Body Dressing/Undressing Lower body dressing      What is the patient wearing?: Pants     Lower body assist Assist for lower body dressing: Contact Guard/Touching assist     Toileting Toileting    Toileting assist Assist for toileting: Contact Guard/Touching assist     Transfers Chair/bed transfer  Transfers assist     Chair/bed transfer assist level: Supervision/Verbal cueing     Locomotion Ambulation   Ambulation assist      Assist level: Supervision/Verbal cueing Assistive device: Walker-rolling Max distance: 150   Walk 10 feet activity   Assist     Assist level: Supervision/Verbal cueing Assistive device: Walker-rolling   Walk 50 feet activity   Assist    Assist level: Supervision/Verbal cueing Assistive device: Walker-rolling    Walk 150 feet activity   Assist Walk 150 feet activity did not occur: Safety/medical concerns  Assist level: Supervision/Verbal cueing Assistive device: Walker-rolling    Walk 10 feet on uneven surface  activity   Assist     Assist level: Minimal  Assistance - Patient > 75% (ramp) Assistive device: Hand held assist, Other (comment) (railing)   Wheelchair     Assist Will patient use wheelchair at discharge?: No             Wheelchair 50 feet with 2 turns activity    Assist            Wheelchair 150 feet  activity     Assist          Blood pressure (!) 142/80, pulse 70, temperature 97.7 F (36.5 C), resp. rate 18, height 5' (1.524 m), weight 63.2 kg, SpO2 97 %.  Medical Problem List and Plan: 1.  Deficits with mobility, endurance, self-care secondary to acute infarct in right cerebellum, right frontal cortex, right thalamus with mild SAH.             -patient may shower             -ELOS/Goals: 7-12 days/mod I/supervision             -Continue CIR therapies including PT, OT, and SLP  2.  Antithrombotics: -DVT/anticoagulation:  Pharmaceutical: Other (comment)--Elquis             -antiplatelet therapy: N/A 3. Right groin pain/Pain Management: Tylenol prn secondary to hematoma. Well controlled 4. Mood: LCSW to follow for evaluation and support.              -antipsychotic agents: N/A 5. Neuropsych: This patient is capable of making decisions on her own behalf. 6. Skin/Wound Care: local care to right groin. Routine pressure relief measures.  7. Fluids/Electrolytes/Nutrition: Encourage intake.  CMP ordered.I: 75ml 8. BFX:OVANVBT BP tid    Vitals:   11/30/19 2005 12/01/19 0449  BP: 140/89 (!) 142/80  Pulse: 75 70  Resp:    Temp: 98.1 F (36.7 C) 97.7 F (36.5 C)  SpO2: 98% 97%  controlled 11/6 9. A fib: HR controlled off metoprolol.  Eliquis resumed 10/28.              Heart rates 70s and 80s 10. Right groin pseudoaneurysm: Still has hematoma which is improving 11. Acute blood loss anemia:              CBC ordered. 12.  Chronic UTIs/urinary frequency: UA positive- keflex started. UC pending  -PVR scan yesterday was 76cc  -purewick catheter for HS frequency very helpful 13. Slow transit constipation: managed with colace and miralax.              Adjust all meds as necessary   -Having regular BM 14.  ? PNA some pleural effusions and chronic interstitial changes on xray , no leukocytosis, afebrile without cough 15. Denies GERD/abdominal pain: will d/c Pantoprazole.  LOS: 8  days A FACE TO FACE EVALUATION WAS PERFORMED  Clide Deutscher Burkley Dech 12/01/2019, 11:46 AM

## 2019-12-02 ENCOUNTER — Inpatient Hospital Stay (HOSPITAL_COMMUNITY): Payer: Medicare Other | Admitting: Physical Therapy

## 2019-12-02 ENCOUNTER — Inpatient Hospital Stay (HOSPITAL_COMMUNITY): Payer: Medicare Other | Admitting: Speech Pathology

## 2019-12-02 LAB — URINE CULTURE: Culture: >=100000 — AB

## 2019-12-02 NOTE — Progress Notes (Signed)
Physical Therapy Session Note  Patient Details  Name: [name] MRN: [id number] Date of Birth: 05-23-1931  Today's Date: 12/02/2019 PT Individual Time: 0904-1006 PT Individual Time Calculation (min): 62 min   Short Term Goals: Week 2:  PT Short Term Goal 1 (Week 2): STG=LTG due ELOS  Skilled Therapeutic Interventions/Progress Updates: Pt presented in bed agreeable to therapy. Pt indicates no pain but states R thigh/groin can get sore if accidentally touches it. Pt performed supine to sit with supervision and use of bed features. At EOB pt threaded pants with minA, bra with minA due to bra hook getting caught and pt unable to clear it. Pt donned shirt with set up and performed STS with RW and supervision to pull pants over hips. Pt then performed ambulatory transfer to w/c with RW and performed oral hygiene w/c level due to fatigue mod I. Pt then transported to rehab gym for energy conservation and participated in ambulation x 123f with SPC. Pt initially with shortened shuffling steps however improved with verbal cues. Pt then participated in obstacle course with SPC weaving through cones and stepping over hockey sticks. Pt was CGA for stepping over objects due to shortened step length and 2/4 times stepping onto threshold. Pt demonstrated narrow BOS when performing turns however no LOB. Pt then participated in four square activities clockwise with verbal cues for increase lateral step length to decrease hip rotation when attempting to clear stick. Pt required modA for stepping backwards over stick due to increased anxiety and minimally taking backwards step when attempting to clear stick. Pt then ambulated additional 772fwith SPC with notably improved step length and B foot clearance. Pt transported remaining distance to room and agreeable to sit up in w/c for awhile. Pt left in w/c with seat alarm on, call bell within reach and needs met.      Therapy Documentation Precautions:   Precautions Precautions: Fall Precaution Comments: watch BP, dizziness Restrictions Weight Bearing Restrictions: No    Therapy/Group: Individual Therapy  Leontae Bostock  Greyson Peavy, PTA  12/02/2019, 12:59 PM

## 2019-12-02 NOTE — Progress Notes (Signed)
[place] tonight. LBM 12/01/19. Denies pain. Place to right groin with small amount of bruising. [name] A

## 2019-12-02 NOTE — Progress Notes (Signed)
Speech Language Pathology Daily Session Note  Patient Details  Name: [name] MRN: [id number] Date of Birth: Jan 24, 1932  Today's Date: 12/02/2019 SLP Individual Time: 1350-1430 SLP Individual Time Calculation (min): 40 min  Short Term Goals: Week 1: SLP Short Term Goal 1 (Week 1): Pt will recall daily events and novel information with supervision A verbal cues for use of external/internal aids. SLP Short Term Goal 2 (Week 1): Pt will complete basic-mildly complex problem solving tasks with min A verbal cues. SLP Short Term Goal 3 (Week 1): Pt will self-monitory and self-correct errors during problem solving tasks with mod A verbal cues. SLP Short Term Goal 4 (Week 1): Pt will demonstrate sustained attention in 15 minute intervals during mildly complex problem solving tasks with supervision A verbal cues for redirection.  Skilled Therapeutic Interventions:  Pt was seen for skilled ST targeting cognitive goals.  Upon arrival, pt had been napping in bed but awakened easily to voice and was agreeable to participating in treatment.  Pt required min assist verbal cues for recall of activities from previous therapy sessions and goals of speech therapy although she was able to independently identify that she was experiencing higher level cognitive challenges.  Pt was able to independently recall her discharge date and when asked how she was feeling about discharging home she stated "I'm apprehensive."  Pt reports that she knows she won't be able to resume her previous level of functioning and responsibility but that her children have been very supportive and willing to help.  Her primary concern is that she will become a burden to her children.  SLP emphasized the importance of routines at home in maximizing functional independence and reducing the burden of care for ADLs in light of current cognitive and physical limitations.  All questions were answered to pt's satisfaction at this time.  Pt was left  in bed with bed alarm set and call bell within reach.  Continue per current plan of care.    Pain Pain Assessment Pain Scale: 0-10 Pain Score: 0-No pain  Therapy/Group: Individual Therapy  Taiwo Fish, Selinda Orion 12/02/2019, 3:06 PM

## 2019-12-02 NOTE — Progress Notes (Signed)
PHYSICAL MEDICINE & REHABILITATION PROGRESS NOTE   Subjective/Complaints: No complaints this morning. Denies pain, constipation, insomnia, dysuria Therapy is going well Diastolic BP high  ROS: Patient denies CP, SOB, N/V/D    Objective:   No results found. No results for input(s): WBC, HGB, HCT, PLT in the last 72 hours. No results for input(s): NA, K, CL, CO2, GLUCOSE, BUN, CREATININE, CALCIUM in the last 72 hours.  Intake/Output Summary (Last 24 hours) at 12/02/2019 1107 Last data filed at 12/02/2019 0710 Gross per 24 hour  Intake 800 ml  Output --  Net 800 ml        Physical Exam: Vital Signs Blood pressure (!) 131/97, pulse 67, temperature 98.3 F (36.8 C), temperature source Oral, resp. rate 16, height 5' (1.524 m), weight 63.2 kg, SpO2 97 %.  General: Alert and oriented x 3, No apparent distress HEENT: Head is normocephalic, atraumatic, PERRLA, EOMI, sclera anicteric, oral mucosa pink and moist, dentition intact, ext ear canals clear,  Neck: Supple without JVD or lymphadenopathy Heart: Reg rate and rhythm. No murmurs rubs or gallops Chest: CTA bilaterally without wheezes, rales, or rhonchi; no distress Abdomen: Soft, non-tender, non-distended, bowel sounds positive. Extremities: No clubbing, cyanosis, or edema. Pulses are 2+  Skin: No evidence of breakdown, no evidence of rash Neurologic: Cranial nerves II through XII intact, motor strength is 4/5 in bilateral deltoid, bicep, tricep, grip, hip flexor, knee extensors, ankle dorsiflexor and plantar flexor   Musculoskeletal: Full ROM, No pain with AROM or PROM in the neck, trunk, or extremities. Posture appropriate       Assessment/Plan: 1. Functional deficits secondary to right cerebellar, frontal and thalamic infarcts with [facility] which require 3+ hours per day of interdisciplinary therapy in a comprehensive inpatient rehab setting.  Physiatrist is providing close team supervision and 24 hour management  of active medical problems listed below.  Physiatrist and rehab team continue to assess barriers to discharge/monitor patient progress toward functional and medical goals  Care Tool:  Bathing    Body parts bathed by patient: Right arm, Left arm, Chest, Abdomen, Front perineal area, Buttocks, Right upper leg, Left upper leg, Right lower leg, Left lower leg, Face         Bathing assist Assist Level: Contact Guard/Touching assist     Upper Body Dressing/Undressing Upper body dressing   What is the patient wearing?: Pull over shirt    Upper body assist Assist Level: Supervision/Verbal cueing    Lower Body Dressing/Undressing Lower body dressing      What is the patient wearing?: Pants     Lower body assist Assist for lower body dressing: Contact Guard/Touching assist     Toileting Toileting    Toileting assist Assist for toileting: Contact Guard/Touching assist     Transfers Chair/bed transfer  Transfers assist     Chair/bed transfer assist level: Supervision/Verbal cueing     Locomotion Ambulation   Ambulation assist      Assist level: Supervision/Verbal cueing Assistive device: Walker-rolling Max distance: 150   Walk 10 feet activity   Assist     Assist level: Supervision/Verbal cueing Assistive device: Walker-rolling   Walk 50 feet activity   Assist    Assist level: Supervision/Verbal cueing Assistive device: Walker-rolling    Walk 150 feet activity   Assist Walk 150 feet activity did not occur: Safety/medical concerns  Assist level: Supervision/Verbal cueing Assistive device: Walker-rolling    Walk 10 feet on uneven surface  activity   Assist  Assist level: Minimal Assistance - Patient > 75% (ramp) Assistive device: Hand held assist, Other (comment) (railing)   Wheelchair     Assist Will patient use wheelchair at discharge?: No             Wheelchair 50 feet with 2 turns activity    Assist             Wheelchair 150 feet activity     Assist          Blood pressure (!) 131/97, pulse 67, temperature 98.3 F (36.8 C), temperature source Oral, resp. rate 16, height 5' (1.524 m), weight 63.2 kg, SpO2 97 %.  Medical Problem List and Plan: 1.  Deficits with mobility, endurance, self-care secondary to acute infarct in right cerebellum, right frontal cortex, right thalamus with mild SAH.             -patient may shower             -ELOS/Goals: 7-12 days/mod I/supervision             -Continue CIR therapies including PT, OT, and SLP  2.  Antithrombotics: -DVT/anticoagulation:  Pharmaceutical: Other (comment)--Elquis             -antiplatelet therapy: N/A 3. Right groin pain/Pain Management: Tylenol prn secondary to hematoma. Well controlled 4. Mood: LCSW to follow for evaluation and support.              -antipsychotic agents: N/A 5. Neuropsych: This patient is capable of making decisions on her own behalf. 6. Skin/Wound Care: local care to right groin. Routine pressure relief measures.  7. Fluids/Electrolytes/Nutrition: Encourage intake.  CMP ordered.I: 730ml 8. BMS:[id number] BP tid    Vitals:   12/01/19 1912 12/02/19 0414  BP: 122/67 (!) 131/97  Pulse: 68 67  Resp: 20 16  Temp: 98 F (36.7 C) 98.3 F (36.8 C)  SpO2: 100% 80%  22/3: diastolic elevated- has mostly been controlled. She loves kiwi, advised eating this regularly at home.  9. A fib: HR controlled off metoprolol.  Eliquis resumed 10/28.              Heart rates 70s and 80s 10. Right groin pseudoaneurysm: Still has hematoma which is improving 11. Acute blood loss anemia:              CBC ordered. 12.  Chronic UTIs/urinary frequency: UA positive- keflex started. UC pending  -PVR scan yesterday was 76cc  -purewick catheter for HS frequency very helpful 13. Slow transit constipation: managed with colace and miralax.              Adjust all meds as necessary   -Having regular BM 14.  ? PNA some pleural  effusions and chronic interstitial changes on xray , no leukocytosis, afebrile without cough 15. Denies GERD/abdominal pain: will d/c Pantoprazole. Continues to deny symptoms of reflux/abdominal pain LOS: 9 days A FACE TO FACE EVALUATION WAS PERFORMED  Charlize Hathaway P Breya Cass 12/02/2019, 11:07 AM

## 2019-12-02 NOTE — Plan of Care (Signed)
  Problem: Consults Goal: RH STROKE PATIENT EDUCATION Description: See Patient Education module for education specifics  Outcome: Progressing   Problem: RH BLADDER ELIMINATION Goal: RH STG MANAGE BLADDER WITH ASSISTANCE Description: STG Manage Bladder With mod I Assistance Outcome: Progressing   Problem: RH SKIN INTEGRITY Goal: RH STG MAINTAIN SKIN INTEGRITY WITH ASSISTANCE Description: STG Maintain Skin Integrity With mod I Assistance. Outcome: Progressing   Problem: RH PAIN MANAGEMENT Goal: RH STG PAIN MANAGED AT OR BELOW PT'S PAIN GOAL Description: Pain level less than 4 on scale of 0-10 Outcome: Progressing   Problem: RH KNOWLEDGE DEFICIT Goal: RH STG INCREASE KNOWLEDGE OF HYPERTENSION Description: Pt will be able to demonstrate understanding of medication regimen, dietary and lifestyle modifications to better control blood pressure and prevent further stroke with mod I assist using handouts and booklet provided. Outcome: Progressing Goal: RH STG INCREASE KNOWLEDGE OF STROKE PROPHYLAXIS Description: Pt will be able to demonstrate understanding of medication regimen, dietary and lifestyle modifications to better control blood pressure and prevent further stroke with mod I assist using handouts and booklet provided. Outcome: Progressing

## 2019-12-03 ENCOUNTER — Inpatient Hospital Stay (HOSPITAL_COMMUNITY): Payer: Medicare Other | Admitting: Physical Therapy

## 2019-12-03 ENCOUNTER — Ambulatory Visit: Payer: Self-pay | Admitting: *Deleted

## 2019-12-03 ENCOUNTER — Inpatient Hospital Stay (HOSPITAL_COMMUNITY): Payer: Medicare Other | Admitting: Occupational Therapy

## 2019-12-03 ENCOUNTER — Inpatient Hospital Stay (HOSPITAL_COMMUNITY): Payer: Medicare Other | Admitting: Speech Pathology

## 2019-12-03 ENCOUNTER — Telehealth: Payer: Self-pay | Admitting: *Deleted

## 2019-12-03 LAB — BASIC METABOLIC PANEL
Anion gap: 7 (ref 5–15)
BUN: 13 mg/dL (ref 8–23)
CO2: 26 mmol/L (ref 22–32)
Calcium: 8.7 mg/dL — ABNORMAL LOW (ref 8.9–10.3)
Chloride: 104 mmol/L (ref 98–111)
Creatinine, Ser: 0.83 mg/dL (ref 0.44–1.00)
GFR, Estimated: >60 mL/min (ref >60)
Glucose, Bld: 130 mg/dL — ABNORMAL HIGH (ref 70–99)
Potassium: 3.6 mmol/L (ref 3.5–5.1)
Sodium: 137 mmol/L (ref 135–145)

## 2019-12-03 LAB — HEPATIC FUNCTION PANEL
ALT: 21 U/L (ref 0–44)
AST: 29 U/L (ref 15–41)
Albumin: 3.1 g/dL — ABNORMAL LOW (ref 3.5–5.0)
Alkaline Phosphatase: 65 U/L (ref 38–126)
Bilirubin, Direct: 0.2 mg/dL (ref 0.0–0.2)
Indirect Bilirubin: 0.9 mg/dL (ref 0.3–0.9)
Total Bilirubin: 1.1 mg/dL (ref 0.3–1.2)
Total Protein: 6.2 g/dL — ABNORMAL LOW (ref 6.5–8.1)

## 2019-12-03 LAB — CBC
HCT: 36.2 % (ref 36.0–46.0)
Hemoglobin: 11.5 g/dL — ABNORMAL LOW (ref 12.0–15.0)
MCH: 32 pg (ref 26.0–34.0)
MCHC: 31.8 g/dL (ref 30.0–36.0)
MCV: 100.8 fL — ABNORMAL HIGH (ref 80.0–100.0)
Platelets: 319 10*3/uL (ref 150–400)
RBC: 3.59 MIL/uL — ABNORMAL LOW (ref 3.87–5.11)
RDW: 14.4 % (ref 11.5–15.5)
WBC: 6.7 10*3/uL (ref 4.0–10.5)
nRBC: 0 % (ref 0.0–0.2)

## 2019-12-03 NOTE — Progress Notes (Signed)
Occupational Therapy Session Note  Patient Details  Name: [name] MRN: [id number] Date of Birth: [birth year]  Today's Date: 12/03/2019 OT Individual Time: 3559-7416 OT Individual Time Calculation (min): 53 min    Short Term Goals: Week 2:  OT Short Term Goal 1 (Week 2): STG = LTG 2/2 ELOS  Skilled Therapeutic Interventions/Progress Updates:  Patient met seated in recliner in agreement with OT treatment session. 0/10 pain at rest and with activity although patient notes fatigue. Daughter present at bedside throughout treatment session. RN made this OT aware of patient with A-fib this A.M. Vitals monitored throughout with resting HR ranging from 92bpm-97bpm and HR with activity in 120's. Patient in agreement with dressing/bathing at sink level. Functional mobility from recliner to sink with Min guard to supervision A. Patient able to bathe UB with set-up A and LB with Min guard. Return to supine with supervision A. Education on patients CLOF and LTG with daughter present at bedside. Session concluded with patient lying supine in bed with call bell within reach, bed alarm activated, and all needs met.   Therapy Documentation Precautions:  Precautions Precautions: Fall Precaution Comments: watch BP, dizziness Restrictions Weight Bearing Restrictions: No General:   Therapy/Group: Individual Therapy  Avigayil Ton R [place]-Davis 12/03/2019, 7:32 AM

## 2019-12-03 NOTE — Progress Notes (Signed)
Speech Language Pathology Daily Session Note  Patient Details  Name: [name] MRN: [id number] Date of Birth: [birth year]  Today's Date: 12/03/2019 SLP Individual Time: 1100-1115 SLP Individual Time Calculation (min): 15 min  Short Term Goals: Week 1: SLP Short Term Goal 1 (Week 1): Pt will recall daily events and novel information with supervision A verbal cues for use of external/internal aids. SLP Short Term Goal 2 (Week 1): Pt will complete basic-mildly complex problem solving tasks with min A verbal cues. SLP Short Term Goal 3 (Week 1): Pt will self-monitory and self-correct errors during problem solving tasks with mod A verbal cues. SLP Short Term Goal 4 (Week 1): Pt will demonstrate sustained attention in 15 minute intervals during mildly complex problem solving tasks with supervision A verbal cues for redirection.  Skilled Therapeutic Interventions: Pt was seen for skilled ST targeting cognitive skills. Upon arrival pt was in restroom having bowel movements following a vomiting episode with PT therapist. Pt required Min-Mod verbal cueing for sequencing and safety awareness when ambulating back to bed with rolling walker, as well as Min A verbal cues for sustained attention to tasks. Unfortunately pt continued to report nausea and unable to participate in further therapy, requesting to rest. Pt missed 49 minites of skilled ST due to illness; will continue efforts to revisit as schedule allows. Pt left laying in bed with alarm set, needs within reach, daughter and RN present. Continue per current plan of care.          Pain Pain Assessment Pain Scale: Faces Faces Pain Scale: Hurts a little bit (mostly nausea rather than pain)  Therapy/Group: Individual Therapy  Arbutus Leas 12/03/2019, 11:28 AM

## 2019-12-03 NOTE — Significant Event (Signed)
Patient ID: [name], female   DOB: May 21, 1931, 86 y.o.   MRN: [id number] Patient with nausea however declined medication this morning. Episode of vomiting bile colored fluid with PT during therapy session and incontinence of bowel x2. Patient had eaten few eggs and english muffin for breakfast, nothing green colored. Confirmed CBC was drawn, results pending. PA aware. Offered compazine and crackers, gingerale post vomiting an patient continues to decline medication and drinks. Did take saltine crackers. Afebrile, VSS except for c/o not feeling well today. Cannot put a finger on issue or specific complaints other than feels tired/worn out. [name]

## 2019-12-03 NOTE — Progress Notes (Signed)
At 0600, When getting up to BR, patient complained of feeling dizzy and nauseated. HRIR. NSR on assessment at 2000 and assessment on Saturday, by this RN. Offered PRN compazine this morning, patient declined. Denies shortness of breath or chest pain. Paged [facility], order for EKG. Results called to Dr. Adam Phenix. Unconfirmed copy and rhythm strip placed in patient's shadow chart. Patient currently reports nausea has resolved. Patrici Ranks A

## 2019-12-03 NOTE — Progress Notes (Signed)
At HS complained of feeling dizzy when sitting on EOB. Resolved in less than 2 minutes. Ambulated to BR without further complaint of. [name] A

## 2019-12-03 NOTE — Progress Notes (Signed)
Physical Therapy Session Note  Patient Details  Name: [name] MRN: [id number] Date of Birth: [birth year]  Today's Date: 12/03/2019 PT Individual Time: 0223-3612 PT Individual Time Calculation (min): 86 min   Short Term Goals: Week 2:  PT Short Term Goal 1 (Week 2): STG=LTG due ELOS  Skilled Therapeutic Interventions/Progress Updates: Pt presented in bed with dgt present agreeable to therapy. Pt with AFib this am and instructed for lower level activities this am. Nsg arrived for orthostatic vitals as noted in flowsheet. Pt performed bed mobility for orthostatic vitals with CGA and increased time. While sitting EOB pt donned pants with supervision and increased time. Pt then performed ambulatory transfer to w/c and transported to rehab gym for energy conservation. Pt then participated in gait training activities in parallel bars and use of agility ladder for reinforcement of increasing step length and B foot clearance. Pt performed lateral steps, backwards walking, and forward stepping approx 21ft each with seated rest between bouts. Pt able to perform but required increased time between bouts due to fatigue. Pt then indicated need for BM. Pt transported back to room and performed ambulatory transfer approx 43ft with RW to toilet. Pt able to perform LB clothing management with CGA however noted to be incontinent of bowel. Pt was able to perform peri-care sitting at toilet with supervision and increased time. Pt indicated increased nausea therefore PTA brought w/c to bathroom to allow pt to perform stand pivot transfer to w/c with CGA however noted increased unsteadiness. Pt moved to bedside then had episode of emesis. Once completed BP checked 132/71 with pt indicated feeling better however pt with another incontinent episode. Pt therefore taken back to toilet and performed stand pivot to return to toilet. Pt then handed off to NT to complete peri-care and return to bed.   During session pt's daughter  with questions regarding current status and indicating does not feel ready for d/c Thursday. Advised dgt that pt would not be d/c'd until medically stable and that team is monitoring (awaiting CBC due to possible low HgB). Provided emotional support that pt may stabilize and that functionally she is performing near goal level. Also advised we will continue to monitor situation.        Therapy Documentation Precautions:  Precautions Precautions: Fall Precaution Comments: watch BP, dizziness Restrictions Weight Bearing Restrictions: No General:   Vital Signs: Therapy Vitals Pulse Rate: (!) 103 BP: 120/73 Patient Position (if appropriate): Standing Oxygen Therapy SpO2: 100 % O2 Device: Room Air    Therapy/Group: Individual Therapy  Yarelli Decelles  Braelon Sprung, PTA  12/03/2019, 11:22 AM

## 2019-12-03 NOTE — Telephone Encounter (Signed)
Patient's daughter called stating that her mother has had a stroke and is in the hospital and asked if the patient could receive her venom injection in the hospital so that she does not get behind on her injections. I advised that unfortunately the hospital likely will not have the venom there to do the injection. I advised to focus on healing and that when she gets home from the hospital and feels better to call our office and if we need to we can always to a split dose with 30 minutes in between to get her back on track. Patient's daughter verbalized understanding and will call back after she is more recovered and feeling better.

## 2019-12-03 NOTE — Progress Notes (Addendum)
[place] PHYSICAL MEDICINE & REHABILITATION PROGRESS NOTE   Subjective/Complaints: Dizzy no pains, labs reviewed , discussed CVA sx with family  ROS: Patient denies CP, SOB, N/V/D    Objective:   No results found. No results for input(s): WBC, HGB, HCT, PLT in the last 72 hours. No results for input(s): NA, K, CL, CO2, GLUCOSE, BUN, CREATININE, CALCIUM in the last 72 hours.  Intake/Output Summary (Last 24 hours) at 12/03/2019 0846 Last data filed at 12/03/2019 0833 Gross per 24 hour  Intake 897 ml  Output --  Net 897 ml        Physical Exam: Vital Signs Blood pressure (!) 150/72, pulse 92, temperature 98.7 F (37.1 C), resp. rate 20, height 5' (1.524 m), weight 63.2 kg, SpO2 96 %.  Eyes non injected , no discharge General: No acute distress Mood and affect are appropriate Heart: IRRR no murmur Lungs: Clear to auscultation, breathing unlabored, no rales or wheezes but poor insp effort  Abdomen: Positive bowel sounds, soft nontender to palpation, nondistended Extremities: No clubbing, cyanosis, or edema  Skin: No evidence of breakdown, no evidence of rash Neurologic: Cranial nerves II through XII intact, motor strength is 4/5 in bilateral deltoid, bicep, tricep, grip, hip flexor, knee extensors, ankle dorsiflexor and plantar flexor Intact FNF and Heel shin BUE and BLE,  Mild nystagmus , horz bilaterally, EOMI   Musculoskeletal:No pain with AROM no knee or ankle eff     Assessment/Plan: 1. Functional deficits secondary to right cerebellar, frontal and thalamic infarcts with [facility] which require 3+ hours per day of interdisciplinary therapy in a comprehensive inpatient rehab setting.  Physiatrist is providing close team supervision and 24 hour management of active medical problems listed below.  Physiatrist and rehab team continue to assess barriers to discharge/monitor patient progress toward functional and medical goals  Care Tool:  Bathing    Body parts bathed  by patient: Right arm, Left arm, Chest, Abdomen, Front perineal area, Buttocks, Right upper leg, Left upper leg, Right lower leg, Left lower leg, Face         Bathing assist Assist Level: Contact Guard/Touching assist     Upper Body Dressing/Undressing Upper body dressing   What is the patient wearing?: Pull over shirt    Upper body assist Assist Level: Supervision/Verbal cueing    Lower Body Dressing/Undressing Lower body dressing      What is the patient wearing?: Pants     Lower body assist Assist for lower body dressing: Contact Guard/Touching assist     Toileting Toileting    Toileting assist Assist for toileting: Contact Guard/Touching assist     Transfers Chair/bed transfer  Transfers assist     Chair/bed transfer assist level: Supervision/Verbal cueing     Locomotion Ambulation   Ambulation assist      Assist level: Supervision/Verbal cueing Assistive device: Walker-rolling Max distance: 150   Walk 10 feet activity   Assist     Assist level: Supervision/Verbal cueing Assistive device: Walker-rolling   Walk 50 feet activity   Assist    Assist level: Supervision/Verbal cueing Assistive device: Walker-rolling    Walk 150 feet activity   Assist Walk 150 feet activity did not occur: Safety/medical concerns  Assist level: Supervision/Verbal cueing Assistive device: Walker-rolling    Walk 10 feet on uneven surface  activity   Assist     Assist level: Minimal Assistance - Patient > 75% (ramp) Assistive device: Hand held assist, Other (comment) (railing)   Wheelchair  Assist Will patient use wheelchair at discharge?: No             Wheelchair 50 feet with 2 turns activity    Assist            Wheelchair 150 feet activity     Assist          Blood pressure (!) 150/72, pulse 92, temperature 98.7 F (37.1 C), resp. rate 20, height 5' (1.524 m), weight 63.2 kg, SpO2 96 %.  Medical Problem List  and Plan: 1.  Deficits with mobility, endurance, self-care secondary to acute infarct in right cerebellum, right frontal cortex, right thalamus with mild SAH.       CIR PT, OT, SLP              -ELOS/Goals: 11/11             -Continue CIR therapies including PT, OT, and SLP  2.  Antithrombotics: -DVT/anticoagulation:  Pharmaceutical: Other (comment)--Eliquis             -antiplatelet therapy: N/A 3. Right groin pain/Pain Management: Tylenol prn secondary to hematoma. Well controlled 4. Mood: LCSW to follow for evaluation and support.              -antipsychotic agents: N/A 5. Neuropsych: This patient is capable of making decisions on her own behalf. 6. Skin/Wound Care: local care to right groin. Routine pressure relief measures.  7. Fluids/Electrolytes/Nutrition: Encourage intake.  CMP ordered.I: 721ml- pt states she is drinking well but will check BUN/Cr 8. QZR:[id number] BP tid    Vitals:   12/03/19 0510 12/03/19 0751  BP: (!) 150/72   Pulse: 84 92  Resp:    Temp: 98.7 F (37.1 C)   SpO2: 33%   35/4: diastolic elevated- has mostly been controlled. Dizziness may be due to BP drops vs CVA location  9. A fib: HR controlled off metoprolol.  Eliquis resumed 10/28.              Heart rates increasing off metoprolol 10. Right groin pseudoaneurysm: Still has hematoma which is improving- recheck CBC 11. Acute blood loss anemia:              CBC ordered. 12.  Chronic UTIs/urinary frequency: UA positive- keflex started. UC showing >100K Kleb P- will   -PVR scan yesterday was 76cc  -purewick catheter for HS frequency very helpful 13. Slow transit constipation: managed with colace and miralax.              Adjust all meds as necessary   -Having regular BM- some loose stools with laxatives  14.  ? PNA some pleural effusions and chronic interstitial changes on xray , no leukocytosis, afebrile without cough 15. Fever - occ cough, no SOB, no abd pain, has Kleb UTI but on Keflex  LOS: 10 days A  FACE TO FACE EVALUATION WAS PERFORMED  Charlett Blake 12/03/2019, 8:46 AM

## 2019-12-04 ENCOUNTER — Ambulatory Visit: Payer: Self-pay

## 2019-12-04 ENCOUNTER — Inpatient Hospital Stay (HOSPITAL_COMMUNITY): Payer: Medicare Other | Admitting: Speech Pathology

## 2019-12-04 ENCOUNTER — Inpatient Hospital Stay (HOSPITAL_COMMUNITY): Payer: Medicare Other

## 2019-12-04 ENCOUNTER — Inpatient Hospital Stay (HOSPITAL_COMMUNITY): Payer: Medicare Other | Admitting: Occupational Therapy

## 2019-12-04 ENCOUNTER — Inpatient Hospital Stay (HOSPITAL_COMMUNITY): Payer: Medicare Other | Admitting: Physical Therapy

## 2019-12-04 DIAGNOSIS — R509 Fever, unspecified: Secondary | ICD-10-CM

## 2019-12-04 DIAGNOSIS — E876 Hypokalemia: Secondary | ICD-10-CM

## 2019-12-04 DIAGNOSIS — R197 Diarrhea, unspecified: Secondary | ICD-10-CM

## 2019-12-04 LAB — CBC WITH DIFFERENTIAL/PLATELET
Abs Immature Granulocytes: 0.05 10*3/uL (ref 0.00–0.07)
Basophils Absolute: 0 10*3/uL (ref 0.0–0.1)
Basophils Relative: 0 %
Eosinophils Absolute: 0 10*3/uL (ref 0.0–0.5)
Eosinophils Relative: 0 %
HCT: 38.7 % (ref 36.0–46.0)
Hemoglobin: 12.2 g/dL (ref 12.0–15.0)
Immature Granulocytes: 1 %
Lymphocytes Relative: 8 %
Lymphs Abs: 0.8 10*3/uL (ref 0.7–4.0)
MCH: 31.1 pg (ref 26.0–34.0)
MCHC: 31.5 g/dL (ref 30.0–36.0)
MCV: 98.7 fL (ref 80.0–100.0)
Monocytes Absolute: 0.7 10*3/uL (ref 0.1–1.0)
Monocytes Relative: 6 %
Neutro Abs: 9.3 10*3/uL — ABNORMAL HIGH (ref 1.7–7.7)
Neutrophils Relative %: 85 %
Platelets: 307 10*3/uL (ref 150–400)
RBC: 3.92 MIL/uL (ref 3.87–5.11)
RDW: 14.4 % (ref 11.5–15.5)
WBC: 10.9 10*3/uL — ABNORMAL HIGH (ref 4.0–10.5)
nRBC: 0 % (ref 0.0–0.2)

## 2019-12-04 LAB — COMPREHENSIVE METABOLIC PANEL
ALT: 23 U/L (ref 0–44)
AST: 24 U/L (ref 15–41)
Albumin: 3.2 g/dL — ABNORMAL LOW (ref 3.5–5.0)
Alkaline Phosphatase: 73 U/L (ref 38–126)
Anion gap: 11 (ref 5–15)
BUN: 13 mg/dL (ref 8–23)
CO2: 21 mmol/L — ABNORMAL LOW (ref 22–32)
Calcium: 8.7 mg/dL — ABNORMAL LOW (ref 8.9–10.3)
Chloride: 104 mmol/L (ref 98–111)
Creatinine, Ser: 0.78 mg/dL (ref 0.44–1.00)
GFR, Estimated: >60 mL/min (ref >60)
Glucose, Bld: 137 mg/dL — ABNORMAL HIGH (ref 70–99)
Potassium: 3.2 mmol/L — ABNORMAL LOW (ref 3.5–5.1)
Sodium: 136 mmol/L (ref 135–145)
Total Bilirubin: 1.3 mg/dL — ABNORMAL HIGH (ref 0.3–1.2)
Total Protein: 6.7 g/dL (ref 6.5–8.1)

## 2019-12-04 LAB — GASTROINTESTINAL PANEL BY PCR, STOOL (REPLACES STOOL CULTURE)

## 2019-12-04 LAB — LACTIC ACID, PLASMA: Lactic Acid, Venous: 1.3 mmol/L (ref 0.5–1.9)

## 2019-12-04 MED ORDER — SODIUM CHLORIDE 0.9 % IV SOLN
1.0000 g | INTRAVENOUS | Status: DC
  Administered 2019-12-04 – 2019-12-07 (×5): 1 g via INTRAVENOUS
  Filled 2019-12-04 (×3): qty 1
  Filled 2019-12-04: qty 10
  Filled 2019-12-04: qty 1

## 2019-12-04 MED ORDER — SODIUM CHLORIDE 0.9% FLUSH
3.0000 mL | Freq: Two times a day (BID) | INTRAVENOUS | Status: DC
  Administered 2019-12-04 – 2019-12-09 (×9): 3 mL via INTRAVENOUS

## 2019-12-04 MED ORDER — DIPHENOXYLATE-ATROPINE 2.5-0.025 MG PO TABS
1.0000 | ORAL_TABLET | ORAL | Status: DC | PRN
  Administered 2019-12-04 – 2019-12-05 (×4): 1 via ORAL
  Filled 2019-12-04 (×4): qty 1

## 2019-12-04 MED ORDER — POTASSIUM CHLORIDE CRYS ER 20 MEQ PO TBCR
40.0000 meq | EXTENDED_RELEASE_TABLET | Freq: Once | ORAL | Status: AC
  Administered 2019-12-04: 40 meq via ORAL
  Filled 2019-12-04: qty 2

## 2019-12-04 MED ORDER — GERHARDT'S BUTT CREAM
TOPICAL_CREAM | Freq: Four times a day (QID) | CUTANEOUS | Status: DC
  Administered 2019-12-05 – 2019-12-07 (×4): 1 via TOPICAL
  Filled 2019-12-04 (×2): qty 1

## 2019-12-04 NOTE — Progress Notes (Signed)
ANTICOAGULATION CONSULT NOTE- follow up   Pharmacy Consult for apixaban Indication: atrial fibrillation  Labs: Recent Labs    12/03/19 0939  HGB 11.5*  HCT 36.2  PLT 319  CREATININE 0.83    Assessment: 3 yof with hx afib/aflutter s/p ablation, off anticoagulation PTA presenting with acute ischemic stroke, s/p TPA 10/19 PM. Noted, no anticoagulation initially started with small SAH s/p TPA and recurrent R groin pseudoaneurysm s/p thrombin injection with obliteration in IR 10/27.  Pharmacy consulted by Neurology to resume apixaban 10/28.  Scr stable and hgb 11.5, plt wnl. We will monitor peripherally  Goal of Therapy:  Stroke prevention Monitor platelets by anticoagulation protocol: Yes   Plan:  Apixaban 5mg  PO BID Rx follow peripherally Apixaban education completed 11/18/19 (before CIR admit) with patient and family member.   Onnie Boer, PharmD, BCIDP, AAHIVP, CPP Infectious Disease Pharmacist 12/04/2019 9:26 AM

## 2019-12-04 NOTE — Progress Notes (Signed)
Occupational Therapy Session Note  Patient Details  Name: RABIA ARGOTE MRN: [id number] Date of Birth: 01-05-1932  Today's Date: 12/04/2019 OT Missed Time: 15 Minutes Missed Time Reason: MD hold (comment)   Short Term Goals: Week 2:  OT Short Term Goal 1 (Week 2): STG = LTG 2/2 ELOS   Skilled Therapeutic Interventions/Progress Updates:    Pt greeted at time of session semireclined in bed with family member at bedside, lab tech seeing pt as well. Family member expressed therapy is on hold today, confirmed with RN. Pt missed 60 mins of OT d/t medical hold.   Therapy Documentation Precautions:  Precautions Precautions: Fall Precaution Comments: watch BP, dizziness Restrictions Weight Bearing Restrictions: No General: General OT Amount of Missed Time: 60 Minutes     Therapy/Group: Individual Therapy  Viona Gilmore 12/04/2019, 9:42 AM

## 2019-12-04 NOTE — Progress Notes (Signed)
Pam, PA made aware of gastrointestinal panel. New orders obtained.  Pt to remain on enteric precautions.  Gerald Stabs, RN

## 2019-12-04 NOTE — Progress Notes (Signed)
Pt's perianal is red and pt states it is sore. Pt continues with loose stools. Reesa Chew PA called and orders obtained.

## 2019-12-04 NOTE — Progress Notes (Signed)
[facility] PHYSICAL MEDICINE & REHABILITATION PROGRESS NOTE   Subjective/Complaints: Elevated temp this morning feels week "all over".   Denies cough but did x 1 , dry  No dysuria  ROS: Patient denies CP, SOB, N/V/D    Objective:   No results found. Recent Labs    12/03/19 0939  WBC 6.7  HGB 11.5*  HCT 36.2  PLT 319   Recent Labs    12/03/19 0939  NA 137  K 3.6  CL 104  CO2 26  GLUCOSE 130*  BUN 13  CREATININE 0.83  CALCIUM 8.7*    Intake/Output Summary (Last 24 hours) at 12/04/2019 0842 Last data filed at 12/03/2019 1900 Gross per 24 hour  Intake 120 ml  Output --  Net 120 ml        Physical Exam: Vital Signs Blood pressure (!) 161/96, pulse (!) 108, temperature (!) 102.1 F (38.9 C), temperature source Oral, resp. rate (!) 28, height 5' (1.524 m), weight 63.2 kg, SpO2 93 %.  Eyes non injected , no discharge General: No acute distress Mood and affect are appropriate Heart: IRRR no murmur Lungs: Clear to auscultation, breathing unlabored, no rales or wheezes but poor insp effort  Abdomen: Positive bowel sounds, soft nontender to palpation, nondistended Extremities: No clubbing, cyanosis, or edema  Skin: No evidence of breakdown, no evidence of rash Neurologic: Cranial nerves II through XII intact, motor strength is 4/5 in bilateral deltoid, bicep, tricep, grip, hip flexor, knee extensors, ankle dorsiflexor and plantar flexor Intact FNF and Heel shin BUE and BLE,  Mild nystagmus , horz bilaterally, EOMI   Musculoskeletal:No pain with AROM no knee or ankle eff     Assessment/Plan: 1. Functional deficits secondary to right cerebellar, frontal and thalamic infarcts with [facility] which require 3+ hours per day of interdisciplinary therapy in a comprehensive inpatient rehab setting.  Physiatrist is providing close team supervision and 24 hour management of active medical problems listed below.  Physiatrist and rehab team continue to assess barriers to  discharge/monitor patient progress toward functional and medical goals  Care Tool:  Bathing    Body parts bathed by patient: Right arm, Left arm, Chest, Abdomen, Front perineal area, Buttocks, Right upper leg, Left upper leg, Right lower leg, Left lower leg, Face         Bathing assist Assist Level: Contact Guard/Touching assist     Upper Body Dressing/Undressing Upper body dressing   What is the patient wearing?: Pull over shirt    Upper body assist Assist Level: Supervision/Verbal cueing    Lower Body Dressing/Undressing Lower body dressing      What is the patient wearing?: Underwear/pull up     Lower body assist Assist for lower body dressing: Contact Guard/Touching assist     Toileting Toileting    Toileting assist Assist for toileting: Contact Guard/Touching assist     Transfers Chair/bed transfer  Transfers assist     Chair/bed transfer assist level: Supervision/Verbal cueing     Locomotion Ambulation   Ambulation assist      Assist level: Supervision/Verbal cueing Assistive device: Walker-rolling Max distance: 150   Walk 10 feet activity   Assist     Assist level: Supervision/Verbal cueing Assistive device: Walker-rolling   Walk 50 feet activity   Assist    Assist level: Supervision/Verbal cueing Assistive device: Walker-rolling    Walk 150 feet activity   Assist Walk 150 feet activity did not occur: Safety/medical concerns  Assist level: Supervision/Verbal cueing Assistive device: Walker-rolling  Walk 10 feet on uneven surface  activity   Assist     Assist level: Minimal Assistance - Patient > 75% (ramp) Assistive device: Hand held assist, Other (comment) (railing)   Wheelchair     Assist Will patient use wheelchair at discharge?: No             Wheelchair 50 feet with 2 turns activity    Assist            Wheelchair 150 feet activity     Assist          Blood pressure (!)  161/96, pulse (!) 108, temperature (!) 102.1 F (38.9 C), temperature source Oral, resp. rate (!) 28, height 5' (1.524 m), weight 63.2 kg, SpO2 93 %.  Medical Problem List and Plan: 1.  Deficits with mobility, endurance, self-care secondary to acute infarct in right cerebellum, right frontal cortex, right thalamus with mild SAH.         hold therapy today due to fever /w/u- ask IM to eval              -ELOS/Goals: 11/11             -Continue CIR therapies including PT, OT, and SLP  2.  Antithrombotics: -DVT/anticoagulation:  Pharmaceutical: Other (comment)--Eliquis             -antiplatelet therapy: N/A 3. Right groin pain/Pain Management: Tylenol prn secondary to hematoma. Well controlled 4. Mood: LCSW to follow for evaluation and support.              -antipsychotic agents: N/A 5. Neuropsych: This patient is capable of making decisions on her own behalf. 6. Skin/Wound Care: local care to right groin. Routine pressure relief measures.  7. Fluids/Electrolytes/Nutrition: Encourage intake.  CMP ordered.I: 722ml- pt states she is drinking well but will check BUN/Cr 8. ERD:EYCXKGY BP tid    Vitals:   12/04/19 0303 12/04/19 0810  BP: (!) 141/73 (!) 161/96  Pulse: 78 (!) 108  Resp: 18 (!) 28  Temp: 98.8 F (37.1 C) (!) 102.1 F (38.9 C)  SpO2: 100% 18%  56/3: diastolic elevated- has mostly been controlled. Dizziness may be due to BP drops vs CVA location  9. A fib: HR controlled off metoprolol.  Eliquis resumed 10/28.              Heart rates increasing off metoprolol 10. Right groin pseudoaneurysm: Still has hematoma which is improving- recheck CBC 11. Acute blood loss anemia:              CBC ordered. 12.  Chronic UTIs/urinary frequency: UA positive- keflex started. UC showing >100K Kleb P- will switch to ceftriaxone  -PVR scan yesterday was 76cc  -purewick catheter for HS frequency very helpful 13. Slow transit constipation: managed with colace and miralax.              Adjust all  meds as necessary   -Having regular BM 14.  ? PNA some pleural effusions and chronic interstitial changes on xray , no leukocytosis, afebrile without cough 15. Fever - occ cough, no SOB, no abd pain, has Kleb UTI but on Keflex  LOS: 11 days A FACE TO FACE EVALUATION WAS PERFORMED  Charlett Blake 12/04/2019, 8:42 AM

## 2019-12-04 NOTE — Progress Notes (Signed)
Pt is alert opened eyes when writer entered room however she is lethargic. Pt's VSS and afebrile.Pt was given bath and linen changed for comfort.  Pt taking sips of water. Denies any pain or nausea. Pt resting with call light within reach bed alarm is on 3 SR up.

## 2019-12-04 NOTE — Consult Note (Signed)
Hospitalist Consult Note   [name]  BEM:[id number]  DOB: 31-Mar-1931  DOA: 11/23/2019  PCP: Crist Infante, MD  Requesting provider: Hadassah Pais  Reason for consultation: fever   History of Present Illness: [name] is a pleasant 86 yo female with PMH A. fib, sciatica who was initially hospitalized from 11/13/2019 until 11/23/2019 for an acute right PCA stroke requiring TPA and mechanical thrombectomy.  Hospitalization complicated with a postprocedural right common femoral artery pseudoaneurysm with hematoma requiring percutaneous thrombin injection on 11/16/2019 followed by repeat thrombin injection on 11/21/2019. She was then discharged to inpatient rehab.  She notes that the pain in her right groin has been improving.  She denies any swelling in her right lower extremity or worsening pain/swelling in the right groin.  Of note, she had been treated outpatient with Augmentin for pneumonia just prior to admission.  CTA neck on admission showed left pleural effusion with left upper lobe opacity consistent with pneumonia.  She completed a course of Augmentin during initial hospitalization as well following this finding.   On 11/5, she developed complaints of dysuria.  A UA was obtained which revealed large leukocyte esterase, positive nitrites, many bacteria, calcium oxalate crystals, and greater than 50 WBC.  She was started on Keflex on 11/30/2019.  Urine culture speciated to Klebsiella, sensitive to cephalosporins. On the morning of 12/04/2019 she developed a fever, 102.1 and one episode of diarrhea.  Keflex was discontinued and she was started on Rocephin on 12/04/2019. Blood cultures were also obtained. CXR also on 11/9 was clear with no obvious infiltrates, edema, or effusions. The patient endorsed worsening weakness over the past 2 to 3 days.  Previously she has been able to walk down the hall with a walker with physical therapy; this morning she states that she would  not be able to do that due to her worsening weakness.  She is hot/sweaty and has also had chills overnight she says.  She denies any productive cough, headaches, neck pain/stiffness, back pain.  She states her dysuria has resolved which was initially present prior to starting on Keflex.  Her son was also present bedside during HPI and evaluation and agrees with above history and assessment.   Review of Systems: Review of Systems - General ROS: positive for  - chills, fatigue, fever and malaise Respiratory ROS: negative for - cough or sputum changes Cardiovascular ROS: negative for - chest pain Gastrointestinal ROS: positive for - diarrhea Genito-Urinary ROS: negative for - dysuria   Past Medical History: Past Medical History:  Diagnosis Date  . Atrial fibrillation ([place])    Prior treatment with amiodarone, held sinus rhythm and drug stopped  . Atrial flutter ([place])    Status post ablation prior to  2009  . Chest pain    Nuclear, 2004, no scar or ischemia, EF 77%  . Ejection fraction    EF 70%, nuclear,  2004  //     . Fall    July, 2014  . H/O bladder repair surgery   . Right Achilles tendinitis   . Sciatica     Past Surgical History: Past Surgical History:  Procedure Laterality Date  . IR CT HEAD LTD  11/14/2019  . IR PERCUTANEOUS ART THROMBECTOMY/INFUSION INTRACRANIAL INC DIAG ANGIO  11/14/2019  . IR US GUIDE VASC ACCESS RIGHT  11/14/2019  . no surgical hx    . RADIOLOGY WITH ANESTHESIA N/A 11/13/2019  Procedure: IR WITH ANESTHESIA;  Surgeon: Luanne Bras, MD;  Location: [facility];  Service: Radiology;  Laterality: N/A;    Allergies:   Allergies  Allergen Reactions  . Bee Venom Anaphylaxis  . Mixed Vespid Venom Anaphylaxis  . Codeine Other (See Comments)    Not known  . Sulfa Antibiotics Itching and Nausea Only  . Erythromycin Nausea And Vomiting    Social History:  reports that she has quit smoking. She has never used smokeless tobacco. She reports current  alcohol use. She reports that she does not use drugs.  Family History: Family History  Problem Relation Age of Onset  . Cancer Mother   . Cancer Father        pancreatic cancer  . Heart disease Brother     Objective: Physical Exam: Vitals:   12/04/19 0810 12/04/19 0917 12/04/19 0924 12/04/19 1015  BP: (!) 161/96 (!) 144/65 133/75 (!) 115/50  Pulse: (!) 108 (!) 120 (!) 105 (!) 102  Resp: (!) 28 (!) 25 (!) 25 (!) 21  Temp: (!) 102.1 F (38.9 C) 98.7 F (37.1 C) 98.7 F (37.1 C) 98.2 F (36.8 C)  TempSrc: Oral Oral Oral Oral  SpO2: 93% 94% 93% 95%  Weight:      Height:       General appearance: Pleasant but fatigued elderly woman lying in bed comfortable and in no distress Head: Normocephalic, without obvious abnormality, atraumatic Eyes: EOMI Neck: No obvious abnormalities, no palpable adenopathy Back: No CVA tenderness Lungs: clear to auscultation bilaterally Chest wall: no tenderness Heart: regular rate and rhythm and S1, S2 normal Abdomen: normal findings: bowel sounds normal, soft, non-tender and right groin noted with bruise over CFA site and is TTP but improved per patient (slightly firm under skin) but no other signs of infection Extremities: no swelling in either LE nor any TTP Skin: normal Neuro: moves all 4 extremities; focal neuro exam deferred   Data reviewed:  I have personally reviewed following labs and imaging studies Results for orders placed or performed during the hospital encounter of 11/23/19 (from the past 48 hour(s))  Basic metabolic panel     Status: Abnormal   Collection Time: 12/03/19  9:39 AM  Result Value Ref Range   Sodium 137 135 - 145 mmol/L   Potassium 3.6 3.5 - 5.1 mmol/L   Chloride 104 98 - 111 mmol/L   CO2 26 22 - 32 mmol/L   Glucose, Bld 130 (H) 70 - 99 mg/dL    Comment: Glucose reference range applies only to samples taken after fasting for at least 8 hours.   BUN 13 8 - 23 mg/dL   Creatinine, Ser 0.83 0.44 - 1.00 mg/dL    Calcium 8.7 (L) 8.9 - 10.3 mg/dL   GFR, Estimated >60 >60 mL/min    Comment: (NOTE) Calculated using the CKD-EPI Creatinine Equation (2021)    Anion gap 7 5 - 15    Comment: Performed at [facility] [address] [place] [address]  CBC     Status: Abnormal   Collection Time: 12/03/19  9:39 AM  Result Value Ref Range   WBC 6.7 4.0 - 10.5 K/uL   RBC 3.59 (L) 3.87 - 5.11 MIL/uL   Hemoglobin 11.5 (L) 12.0 - 15.0 g/dL   HCT 36.2 36 - 46 %   MCV 100.8 (H) 80.0 - 100.0 fL   MCH 32.0 26.0 - 34.0 pg   MCHC 31.8 30.0 - 36.0 g/dL   RDW 14.4  11.5 - 15.5 %   Platelets 319 150 - 400 K/uL   nRBC 0.0 0.0 - 0.2 %    Comment: Performed at [facility], [place] [address], [place], [place] [address]  Hepatic function panel     Status: Abnormal   Collection Time: 12/03/19  6:03 PM  Result Value Ref Range   Total Protein 6.2 (L) 6.5 - 8.1 g/dL   Albumin 3.1 (L) 3.5 - 5.0 g/dL   AST 29 15 - 41 U/L   ALT 21 0 - 44 U/L   Alkaline Phosphatase 65 38 - 126 U/L   Total Bilirubin 1.1 0.3 - 1.2 mg/dL   Bilirubin, Direct 0.2 0.0 - 0.2 mg/dL   Indirect Bilirubin 0.9 0.3 - 0.9 mg/dL    Comment: Performed at [facility] [address], [place], [place] [address]    Labs:  CBC: Recent Labs  Lab 12/03/19 0939  WBC 6.7  HGB 11.5*  HCT 36.2  MCV 100.8*  PLT 063    Basic Metabolic Panel: Recent Labs  Lab 12/03/19 0939  NA 137  K 3.6  CL 104  CO2 26  GLUCOSE 130*  BUN 13  CREATININE 0.83  CALCIUM 8.7*   GFR Estimated Creatinine Clearance: 40.4 mL/min (by C-G formula based on SCr of 0.83 mg/dL). Liver Function Tests: Recent Labs  Lab 12/03/19 1803  AST 29  ALT 21  ALKPHOS 65  BILITOT 1.1  PROT 6.2*  ALBUMIN 3.1*   No results for input(s): LIPASE, AMYLASE in the last 168 hours. No results for input(s): AMMONIA in the last 168 hours. Coagulation profile No results for input(s): INR, PROTIME in the last 168 hours.  Cardiac Enzymes: No results for  input(s): CKTOTAL, CKMB, CKMBINDEX, TROPONINI in the last 168 hours. BNP: Invalid input(s): POCBNP CBG: Recent Labs  Lab 11/29/19 1640 11/30/19 1139 11/30/19 1627 12/01/19 1129 12/01/19 1621  GLUCAP 97 113* 101* 113* 105*   D-Dimer No results for input(s): DDIMER in the last 72 hours. Hgb A1c No results for input(s): HGBA1C in the last 72 hours. Lipid Profile No results for input(s): CHOL, HDL, LDLCALC, TRIG, CHOLHDL, LDLDIRECT in the last 72 hours. Thyroid function studies No results for input(s): TSH, T4TOTAL, T3FREE, THYROIDAB in the last 72 hours.  Invalid input(s): FREET3 Anemia work up No results for input(s): VITAMINB12, FOLATE, FERRITIN, TIBC, IRON, RETICCTPCT in the last 72 hours. Urinalysis    Component Value Date/Time   COLORURINE AMBER (A) 11/30/2019 0928   APPEARANCEUR CLOUDY (A) 11/30/2019 0928   LABSPEC 1.021 11/30/2019 0928   PHURINE 5.0 11/30/2019 0928   GLUCOSEU NEGATIVE 11/30/2019 0928   HGBUR SMALL (A) 11/30/2019 0928   BILIRUBINUR NEGATIVE 11/30/2019 0928   KETONESUR NEGATIVE 11/30/2019 0928   PROTEINUR 30 (A) 11/30/2019 0928   NITRITE POSITIVE (A) 11/30/2019 0928   LEUKOCYTESUR LARGE (A) 11/30/2019 0928    Microbiology Recent Results (from the past 240 hour(s))  Culture, Urine     Status: Abnormal   Collection Time: 11/30/19 12:59 PM   Specimen: Urine, Random  Result Value Ref Range Status   Specimen Description URINE, RANDOM  Final   Special Requests   Final    NONE Performed at [facility], [place] [address] [place], [place] [address]    Culture >=100,000 COLONIES/mL KLEBSIELLA PNEUMONIAE (A)  Final   Report Status 12/02/2019 FINAL  Final   Organism ID, Bacteria KLEBSIELLA PNEUMONIAE (A)  Final      Susceptibility  Klebsiella pneumoniae - MIC*    AMPICILLIN >=32 RESISTANT Resistant     CEFAZOLIN <=4 SENSITIVE Sensitive     CEFEPIME <=0.12 SENSITIVE Sensitive     CEFTRIAXONE <=0.25 SENSITIVE Sensitive     CIPROFLOXACIN <=0.25  SENSITIVE Sensitive     GENTAMICIN <=1 SENSITIVE Sensitive     IMIPENEM <=0.25 SENSITIVE Sensitive     NITROFURANTOIN 128 RESISTANT Resistant     TRIMETH/SULFA <=20 SENSITIVE Sensitive     AMPICILLIN/SULBACTAM 4 SENSITIVE Sensitive     PIP/TAZO <=4 SENSITIVE Sensitive     * >=100,000 COLONIES/mL KLEBSIELLA PNEUMONIAE    Inpatient Medications:   Scheduled Meds: . apixaban  5 mg Oral BID  . Gerhardt's butt cream   Topical QID   PRN Meds: acetaminophen, alum & mag hydroxide-simeth, bisacodyl, diphenhydrAMINE, diphenoxylate-atropine, docusate sodium, guaiFENesin-dextromethorphan, melatonin, prochlorperazine **OR** prochlorperazine **OR** prochlorperazine, sodium phosphate Continuous Infusions: . cefTRIAXone (ROCEPHIN)  IV      Radiological Exams on Admission: DG Chest 2 View  Result Date: 12/04/2019 CLINICAL DATA:  Fevers EXAM: CHEST - 2 VIEW COMPARISON:  11/14/2018 FINDINGS: Cardiac shadow is stable. Aortic calcifications are again seen. Scarring is noted in the subpleural regions bilaterally stable from the prior exam. No focal infiltrate or sizable effusion is seen. No bony abnormality is noted. IMPRESSION: Scarring bilaterally stable from the prior study. No acute abnormality noted. Electronically Signed   By: Inez Catalina M.D.   On: 12/04/2019 09:04    Assessment/Plan:  Fever - differential at this time includes possible bacteremia from translocated urinary source (her weakness is concerning for bacteremia) vs atelectasis (though not appreciated on CXR and lungs sound clear) vs central fever in setting of recent stroke (although no significant fever prior to now) vs RLE infection (recent thrombin injection into pseudoaneurysm/hematoma)  - agree with keflex change to Rocephin - follow up blood cultures - CBC with diff in am - Incentive spirometer to bedside - continue monitoring RLE for any development of cellulitis vs groin swelling/abscess vs RLE DVT (although patient  anti-coagulated); if workup remains negative but persistent fever, may need repeat RLE scanning  Diarrhea - so far 1 episode per patient; may be abx induced  - if worsens will send for Cdiff and GI pathogen panel - s/p keflex 11/5 - 11/9  Hypokalemia - will replete - follow up BMP in am and check Mg as well   Thank you for this consultation.  Our [facility] hospitalist team will follow the patient with you.   Time spent: Greater than 50% of the 35 minute visit was spent in counseling/coordination of care for the patient as laid out in the A&P.   Dwyane Dee M.D. [facility] 12/04/2019, 10:29 AM Pager: Secure chat

## 2019-12-04 NOTE — Progress Notes (Signed)
   12/04/19 0810  Assess: MEWS Score  Temp (!) 102.1 F (38.9 C)  BP (!) 161/96  Pulse Rate (!) 108  Resp (!) 28  SpO2 93 %  O2 Device Room Air  Assess: MEWS Score  MEWS Temp 2  MEWS Systolic 0  MEWS Pulse 1  MEWS RR 2  MEWS LOC 0  MEWS Score 5  MEWS Score Color Red  Assess: if the MEWS score is Yellow or Red  Were vital signs taken at a resting state? Yes  Focused Assessment No change from prior assessment  Early Detection of Sepsis Score *See Row Information* High  MEWS guidelines implemented *See Row Information* Yes  Treat  Pain Scale 0-10  Pain Score 0  Take Vital Signs  Increase Vital Sign Frequency  Red: Q 1hr X 4 then Q 4hr X 4, if remains red, continue Q 4hrs  Escalate  MEWS: Escalate Red: discuss with charge nurse/RN and provider, consider discussing with RRT  Notify: Charge Nurse/RN  Name of Charge Nurse/RN Notified Odena Mcquaid, RN  Date Charge Nurse/RN Notified 12/04/19  Time Charge Nurse/RN Notified [id number]  Notify: Provider  Provider Name/Title Dr. Letta Pate  Date Provider Notified 12/04/19  Time Provider Notified [phone]  Notification Type Face-to-face  Notification Reason Change in status (pt slightly lethargic, increase temp, RR)  Response See new orders  Date of Provider Response 12/04/19  Time of Provider Response [phone]  Document  Patient Outcome Other (Comment) (monitoring and awaiting orders)  Progress note created (see row info) Yes

## 2019-12-04 NOTE — Progress Notes (Signed)
Physical Therapy Session Note  Patient Details  Name: [name] MRN: [id number] Date of Birth: 09-Jan-1932  Today's Date: 12/04/2019     Short Term Goals: Week 2:  PT Short Term Goal 1 (Week 2): STG=LTG due ELOS  Skilled Therapeutic Interventions/Progress Updates: Pt currently on medical hold. Will continue efforts when medically stable. Missed 75 minutes skilled PT.      Therapy Documentation Precautions:  Precautions Precautions: Fall Precaution Comments: watch BP, dizziness Restrictions Weight Bearing Restrictions: No General: PT Amount of Missed Time (min): 75 Minutes PT Missed Treatment Reason: MD hold (Comment) Vital Signs: Therapy Vitals Temp: (!) 100.4 F (38 C) Temp Source: Oral Pulse Rate: (!) 108 Resp: (!) 22 BP: 126/62 Patient Position (if appropriate): Lying Oxygen Therapy SpO2: 96 % O2 Device: Room Air Pain: Pain Assessment Pain Score: 0-No pain    Therapy/Group: Individual Therapy  Lakesa Coste  Annais Crafts, PTA  12/04/2019, 12:50 PM

## 2019-12-04 NOTE — Progress Notes (Signed)
Speech Language Pathology Daily Session Note  Patient Details  Name: [name] MRN: [id number] Date of Birth: 27-May-1931  Today's Date: 12/04/2019 SLP Missed Time: 52 Minutes Missed Time Reason: MD hold (Comment) (RN stated that pt is on hold for all therapies, per MD order)  Short Term Goals: Week 1: SLP Short Term Goal 1 (Week 1): Pt will recall daily events and novel information with supervision A verbal cues for use of external/internal aids. SLP Short Term Goal 2 (Week 1): Pt will complete basic-mildly complex problem solving tasks with min A verbal cues. SLP Short Term Goal 3 (Week 1): Pt will self-monitory and self-correct errors during problem solving tasks with mod A verbal cues. SLP Short Term Goal 4 (Week 1): Pt will demonstrate sustained attention in 15 minute intervals during mildly complex problem solving tasks with supervision A verbal cues for redirection.  Skilled Therapeutic Interventions: SLP arrived for session; RN and NT in room assisting pt with hygiene and RN stated pt is on hold for all therapies, per MD order. Pt missed 60 mins skilled ST as a result.        Arbutus Leas 12/04/2019, 11:08 AM

## 2019-12-05 ENCOUNTER — Inpatient Hospital Stay (HOSPITAL_COMMUNITY): Payer: Medicare Other | Admitting: Physical Therapy

## 2019-12-05 ENCOUNTER — Inpatient Hospital Stay (HOSPITAL_COMMUNITY): Payer: Medicare Other | Admitting: Occupational Therapy

## 2019-12-05 ENCOUNTER — Inpatient Hospital Stay (HOSPITAL_COMMUNITY): Payer: Medicare Other | Admitting: Speech Pathology

## 2019-12-05 LAB — CBC WITH DIFFERENTIAL/PLATELET
Abs Immature Granulocytes: 0.03 10*3/uL (ref 0.00–0.07)
Basophils Absolute: 0 10*3/uL (ref 0.0–0.1)
Basophils Relative: 0 %
Eosinophils Absolute: 0 10*3/uL (ref 0.0–0.5)
Eosinophils Relative: 0 %
HCT: 36.8 % (ref 36.0–46.0)
Hemoglobin: 11.7 g/dL — ABNORMAL LOW (ref 12.0–15.0)
Immature Granulocytes: 0 %
Lymphocytes Relative: 16 %
Lymphs Abs: 1.5 10*3/uL (ref 0.7–4.0)
MCH: 32.1 pg (ref 26.0–34.0)
MCHC: 31.8 g/dL (ref 30.0–36.0)
MCV: 100.8 fL — ABNORMAL HIGH (ref 80.0–100.0)
Monocytes Absolute: 0.7 10*3/uL (ref 0.1–1.0)
Monocytes Relative: 8 %
Neutro Abs: 6.8 10*3/uL (ref 1.7–7.7)
Neutrophils Relative %: 76 %
Platelets: 280 10*3/uL (ref 150–400)
RBC: 3.65 MIL/uL — ABNORMAL LOW (ref 3.87–5.11)
RDW: 14.4 % (ref 11.5–15.5)
WBC: 9 10*3/uL (ref 4.0–10.5)
nRBC: 0 % (ref 0.0–0.2)

## 2019-12-05 LAB — BASIC METABOLIC PANEL
Anion gap: 9 (ref 5–15)
BUN: 16 mg/dL (ref 8–23)
CO2: 22 mmol/L (ref 22–32)
Calcium: 8.7 mg/dL — ABNORMAL LOW (ref 8.9–10.3)
Chloride: 104 mmol/L (ref 98–111)
Creatinine, Ser: 0.9 mg/dL (ref 0.44–1.00)
GFR, Estimated: >60 mL/min (ref >60)
Glucose, Bld: 117 mg/dL — ABNORMAL HIGH (ref 70–99)
Potassium: 4 mmol/L (ref 3.5–5.1)
Sodium: 135 mmol/L (ref 135–145)

## 2019-12-05 LAB — MAGNESIUM: Magnesium: 1.9 mg/dL (ref 1.7–2.4)

## 2019-12-05 MED ORDER — SODIUM CHLORIDE 0.9 % IV SOLN
INTRAVENOUS | Status: DC | PRN
  Administered 2019-12-05 – 2019-12-07 (×3): 250 mL via INTRAVENOUS

## 2019-12-05 NOTE — Progress Notes (Signed)
Team Conference Report to Patient/Family  Team Conference discussion was reviewed with the patient and caregiver, including goals, any changes in plan of care and target discharge date.  Patient and caregiver express understanding and are in agreement.  The patient has a target discharge date of 12/08/19 (pending labs).  Dyanne Iha 12/05/2019, 12:47 PM Patient ID: [name], female   DOB: 01-02-1932, 86 y.o.   MRN: [id number]

## 2019-12-05 NOTE — Progress Notes (Addendum)
Occupational Therapy Session Note  Patient Details  Name: [name] MRN: [id number] Date of Birth: [birth year]  Today's Date: 12/05/2019 OT Individual Time: 1417-1500 OT Individual Time Calculation (min): 43 min    Short Term Goals: Week 2:  OT Short Term Goal 1 (Week 2): STG = LTG 2/2 ELOS  Skilled Therapeutic Interventions/Progress Updates:  Patient met lying supine in bed. 0/10 pain at rest and with activity. Patient reports feeling better today than over the last few days. Patient restricted to room for therapy. Supine to EOB with close supervision A. Functional mobility with use of SPC with close supervision A. Patient declining bathing/dressing wishing to remain in hospital gown but requested to use commode in bathroom. Toilet transfer and toileting/hygiene/clothing management with Min guard. At sink level, patient completed 2/3 grooming tasks in standing without rest break. Patient requested to sit to floss teeth. Return to supine with close supervision A. Session concluded with patient lying supine in bed with call bell within reach, bed alarm activated, and all needs met.   Therapy Documentation Precautions:  Precautions Precautions: Fall Precaution Comments: watch BP, dizziness Restrictions Weight Bearing Restrictions: No General:    Therapy/Group: Individual Therapy  Tiquan Bouch R Howerton-Davis 12/05/2019, 12:13 PM

## 2019-12-05 NOTE — Progress Notes (Signed)
Speech Language Pathology Daily Session Note  Patient Details  Name: [name] MRN: [id number] Date of Birth: 1931-11-14  Today's Date: 12/06/2019 SLP Individual Time: 1400-1458 SLP Individual Time Calculation (min): 58 min  Short Term Goals: Week 2: SLP Short Term Goal 1 (Week 2): STG=LTG due to remaining LOS  Skilled Therapeutic Interventions: Pt was seen for skilled ST targeting cognitive goals. Pt's daughter was present for session today, and SLP answered many questions regarding pt's current functional status. Pt and daughter expressed feeling apprehensive about upcoming discharge, and therefore SLP encouraged them to ask PT/OT about specific questions relevant to those disciplines, as well as Education officer, museum as needed. SLP answered questions about cognitive-linguistic status and recommendation for 24/7 supervision for both physical and cognitive assistance, per therapy recommendations. Pt completed a semi-complex deductive reasoning puzzle with Min A verbal cues for problem solving and Mod A for error awareness. She sustained attention to tasks with Supervision A verbal cues for redirection. She expressed need to void during session, and demonstrated improvement in sequencing, functional basic problem solving and safety awareness during mobility with cane and transfers. Pt left laying in bed with alarm set and needs within reach, daughter still present. Continue per current plan of care.          Pain Pain Assessment Pain Scale: 0-10 Pain Score: 0-No pain  Therapy/Group: Individual Therapy  Arbutus Leas 12/06/2019, 3:02 PM

## 2019-12-05 NOTE — Progress Notes (Signed)
[facility] PHYSICAL MEDICINE & REHABILITATION PROGRESS NOTE   Subjective/Complaints:  No issues overnite, stools diminsishing, no abd pain, discussed labwork and xray with daughter  ROS: Patient denies CP, SOB, N/V/D    Objective:   DG Chest 2 View  Result Date: 12/04/2019 CLINICAL DATA:  Fevers EXAM: CHEST - 2 VIEW COMPARISON:  11/14/2018 FINDINGS: Cardiac shadow is stable. Aortic calcifications are again seen. Scarring is noted in the subpleural regions bilaterally stable from the prior exam. No focal infiltrate or sizable effusion is seen. No bony abnormality is noted. IMPRESSION: Scarring bilaterally stable from the prior study. No acute abnormality noted. Electronically Signed   By: Inez Catalina M.D.   On: 12/04/2019 09:04   Recent Labs    12/04/19 0943 12/05/19 0416  WBC 10.9* 9.0  HGB 12.2 11.7*  HCT 38.7 36.8  PLT 307 280   Recent Labs    12/04/19 0943 12/05/19 0416  NA 136 135  K 3.2* 4.0  CL 104 104  CO2 21* 22  GLUCOSE 137* 117*  BUN 13 16  CREATININE 0.78 0.90  CALCIUM 8.7* 8.7*    Intake/Output Summary (Last 24 hours) at 12/05/2019 0856 Last data filed at 12/04/2019 2037 Gross per 24 hour  Intake 93 ml  Output --  Net 93 ml        Physical Exam: Vital Signs Blood pressure 116/60, pulse (!) 103, temperature 98.4 F (36.9 C), resp. rate 16, height 5' (1.524 m), weight 63.2 kg, SpO2 94 %.  General: No acute distress Mood and affect are appropriate Heart: Regular rate and rhythm no rubs murmurs or extra sounds Lungs: Clear to auscultation, breathing unlabored, no rales or wheezes Abdomen: Positive bowel sounds, soft nontender to palpation, nondistended Extremities: No clubbing, cyanosis, or edema  Skin: No evidence of breakdown, no evidence of rash Neurologic: Cranial nerves II through XII intact, motor strength is 4/5 in bilateral deltoid, bicep, tricep, grip, hip flexor, knee extensors, ankle dorsiflexor and plantar flexor Intact FNF and Heel  shin BUE and BLE,  Mild nystagmus , horz bilaterally, EOMI   Musculoskeletal:No pain with AROM no knee or ankle eff     Assessment/Plan: 1. Functional deficits secondary to right cerebellar, frontal and thalamic infarcts with [facility] which require 3+ hours per day of interdisciplinary therapy in a comprehensive inpatient rehab setting. Physiatrist is providing close team supervision and 24 hour management of active medical problems listed below. Physiatrist and rehab team continue to assess barriers to discharge/monitor patient progress toward functional and medical goals  Care Tool:  Bathing    Body parts bathed by patient: Right arm, Left arm, Chest, Abdomen, Front perineal area, Buttocks, Right upper leg, Left upper leg, Right lower leg, Left lower leg, Face         Bathing assist Assist Level: Contact Guard/Touching assist     Upper Body Dressing/Undressing Upper body dressing   What is the patient wearing?: Pull over shirt    Upper body assist Assist Level: Supervision/Verbal cueing    Lower Body Dressing/Undressing Lower body dressing      What is the patient wearing?: Underwear/pull up     Lower body assist Assist for lower body dressing: Contact Guard/Touching assist     Toileting Toileting    Toileting assist Assist for toileting: Contact Guard/Touching assist     Transfers Chair/bed transfer  Transfers assist     Chair/bed transfer assist level: Supervision/Verbal cueing     Locomotion Ambulation   Ambulation assist  Assist level: Supervision/Verbal cueing Assistive device: Walker-rolling Max distance: 150   Walk 10 feet activity   Assist     Assist level: Supervision/Verbal cueing Assistive device: Walker-rolling   Walk 50 feet activity   Assist    Assist level: Supervision/Verbal cueing Assistive device: Walker-rolling    Walk 150 feet activity   Assist Walk 150 feet activity did not occur: Safety/medical  concerns  Assist level: Supervision/Verbal cueing Assistive device: Walker-rolling    Walk 10 feet on uneven surface  activity   Assist     Assist level: Minimal Assistance - Patient > 75% (ramp) Assistive device: Hand held assist, Other (comment) (railing)   Wheelchair     Assist Will patient use wheelchair at discharge?: No             Wheelchair 50 feet with 2 turns activity    Assist            Wheelchair 150 feet activity     Assist          Blood pressure 116/60, pulse (!) 103, temperature 98.4 F (36.9 C), resp. rate 16, height 5' (1.524 m), weight 63.2 kg, SpO2 94 %.  Medical Problem List and Plan: 1.  Deficits with mobility, endurance, self-care secondary to acute infarct in right cerebellum, right frontal cortex, right thalamus with mild SAH.         hold therapy today due to fever /w/u- ask IM to eval              -ELOS/Goals: 11/11             -Continue CIR therapies including PT, OT, and SLP  2.  Antithrombotics: -DVT/anticoagulation:  Pharmaceutical: Other (comment)--Eliquis             -antiplatelet therapy: N/A 3. Right groin pain/Pain Management: Tylenol prn secondary to hematoma. Well controlled 4. Mood: LCSW to follow for evaluation and support.              -antipsychotic agents: N/A 5. Neuropsych: This patient is capable of making decisions on her own behalf. 6. Skin/Wound Care: local care to right groin. Routine pressure relief measures.  7. Fluids/Electrolytes/Nutrition: Encourage intake.  CMP ordered.I: 769ml- pt states she is drinking well but will check BUN/Cr HypoK improved with supplementation  8. NWG:NFAOZHY BP tid    Vitals:   12/05/19 0403 12/05/19 0405  BP: 118/64 116/60  Pulse: (!) 103 (!) 103  Resp: 18 16  Temp: 98.4 F (36.9 C) 98.4 F (36.9 C)  SpO2: 94% 86%  57/8: diastolic elevated- has mostly been controlled. Dizziness may be due to BP drops vs CVA location  9. A fib: HR controlled off metoprolol.   Eliquis resumed 10/28.              Heart rates increasing off metoprolol 10. Right groin pseudoaneurysm: Still has hematoma which is improving- recheck CBC 11. Acute blood loss anemia:              CBC ordered. 12.  Chronic UTIs/urinary frequency: UA positive- keflex started. UC showing >100K Kleb P- will switch to ceftriaxone  -PVR scan yesterday was 76cc  -purewick catheter for HS frequency very helpful 13. Slow transit constipation: managed with colace and miralax.              Adjust all meds as necessary   -Having regular BM 14.  No clinical evidence of PNA PNA some chronic interstitial changes on xray , no leukocytosis, afebrile  without cough 15. Fever - likely Salmonella, await BC, appreciate IM consult LOS: 12 days A FACE TO FACE EVALUATION WAS PERFORMED  Charlett Blake 12/05/2019, 8:56 AM

## 2019-12-05 NOTE — Progress Notes (Signed)
PROGRESS NOTE    [name]  CXK:[id number] DOB: 30-May-1931 DOA: 11/23/2019 PCP: Melissa Infante, MD  Brief Narrative: [name] is a pleasant 86 yo female with PMH A. fib, sciatica who was initially hospitalized from 11/13/2019 until 11/23/2019 for an acute right PCA stroke requiring TPA and mechanical thrombectomy.  Hospitalization complicated with a postprocedural right common femoral artery pseudoaneurysm with hematoma requiring percutaneous thrombin injection on 11/16/2019 followed by repeat thrombin injection on 11/21/2019. She was then discharged to inpatient rehab. -Just completed a course of Augmentin for pneumonia just prior to this admission  -While in rehab on 11/5 developed dysuria, abnormal urinalysis started Keflex, urine culture grew Klebsiella which is sensitive to cephalosporins, subsequently on 11/9 developed fever of 102 with diarrhea and lethargy, Keflex was changed to Rocephin and TRH was consulted   Assessment & Plan:   Principal Problem:   Acute ischemic right PCA stroke ([place]) s/p tPA and mechanical thrombectomy, d/t AF not on [facility] -Continue therapy at rehab -Continue Eliquis  Klebsiella UTI -Initially treated with Keflex from 11/5, now on ceftriaxone  Salmonella gastroenteritis -On 11/9 developed fever and acute diarrhea, GI pathogen panel positive for Salmonella -Continue ceftriaxone which will cover this, follow-up blood cultures -Clinically improving -TRH will follow -   DVT prophylaxis: Apixaban Code Status: Full code Family Communication: [name] at bedside Disposition Plan: Home likely in 48 hours    Consultants:      Procedures:   Antimicrobials:    Subjective: -Feels much better, diarrhea has resolved, no dysuria, feels well overall except anorexia  Objective: Vitals:   12/05/19 0358 12/05/19 0403 12/05/19 0405 12/05/19 1356  BP: 120/68 118/64 116/60 (!) 109/57  Pulse: 100 (!) 103 (!) 103 89  Resp: 18 18 16 20   Temp: 98.4 F (36.9  C) 98.4 F (36.9 C) 98.4 F (36.9 C) 98.1 F (36.7 C)  TempSrc: Oral Oral    SpO2: 95% 94% 94% 98%  Weight:      Height:        Intake/Output Summary (Last 24 hours) at 12/05/2019 1534 Last data filed at 12/05/2019 1500 Gross per 24 hour  Intake 314.71 ml  Output --  Net 314.71 ml   Filed Weights   11/28/19 1616 11/29/19 1347 11/30/19 1000  Weight: 63.2 kg 63.5 kg 63.2 kg    Examination:  General exam: AAOx3, no distress Respiratory system: Clear Cardiovascular system: S1 & S2 heard, RRR  Gastrointestinal system: Abdomen is nondistended, soft and nontender.Normal bowel sounds heard. Central nervous system: Alert and oriented.  Moves all extremities, no lateralizing signs Extremities: No edema Skin: No rashes on exposed skin Psychiatry:  Mood & affect appropriate.     Data Reviewed:   CBC: Recent Labs  Lab 12/03/19 0939 12/04/19 0943 12/05/19 0416  WBC 6.7 10.9* 9.0  NEUTROABS  --  9.3* 6.8  HGB 11.5* 12.2 11.7*  HCT 36.2 38.7 36.8  MCV 100.8* 98.7 100.8*  PLT 319 307 970   Basic Metabolic Panel: Recent Labs  Lab 12/03/19 0939 12/04/19 0943 12/05/19 0416  NA 137 136 135  K 3.6 3.2* 4.0  CL 104 104 104  CO2 26 21* 22  GLUCOSE 130* 137* 117*  BUN 13 13 16   CREATININE 0.83 0.78 0.90  CALCIUM 8.7* 8.7* 8.7*  MG  --   --  1.9   GFR: Estimated Creatinine Clearance: 37.3 mL/min (by C-G formula based on SCr of 0.9 mg/dL). Liver Function Tests: Recent Labs  Lab 12/03/19 1803 12/04/19 2637  AST 29 24  ALT 21 23  ALKPHOS 65 73  BILITOT 1.1 1.3*  PROT 6.2* 6.7  ALBUMIN 3.1* 3.2*   No results for input(s): LIPASE, AMYLASE in the last 168 hours. No results for input(s): AMMONIA in the last 168 hours. Coagulation Profile: No results for input(s): INR, PROTIME in the last 168 hours. Cardiac Enzymes: No results for input(s): CKTOTAL, CKMB, CKMBINDEX, TROPONINI in the last 168 hours. BNP (last 3 results) No results for input(s): PROBNP in the last  8760 hours. HbA1C: No results for input(s): HGBA1C in the last 72 hours. CBG: Recent Labs  Lab 11/29/19 1640 11/30/19 1139 11/30/19 1627 12/01/19 1129 12/01/19 1621  GLUCAP 97 113* 101* 113* 105*   Lipid Profile: No results for input(s): CHOL, HDL, LDLCALC, TRIG, CHOLHDL, LDLDIRECT in the last 72 hours. Thyroid Function Tests: No results for input(s): TSH, T4TOTAL, FREET4, T3FREE, THYROIDAB in the last 72 hours. Anemia Panel: No results for input(s): VITAMINB12, FOLATE, FERRITIN, TIBC, IRON, RETICCTPCT in the last 72 hours. Urine analysis:    Component Value Date/Time   COLORURINE AMBER (A) 11/30/2019 0928   APPEARANCEUR CLOUDY (A) 11/30/2019 0928   LABSPEC 1.021 11/30/2019 0928   PHURINE 5.0 11/30/2019 0928   GLUCOSEU NEGATIVE 11/30/2019 0928   HGBUR SMALL (A) 11/30/2019 0928   BILIRUBINUR NEGATIVE 11/30/2019 0928   KETONESUR NEGATIVE 11/30/2019 0928   PROTEINUR 30 (A) 11/30/2019 0928   NITRITE POSITIVE (A) 11/30/2019 0928   LEUKOCYTESUR LARGE (A) 11/30/2019 0928   Sepsis Labs: @LABRCNTIP (procalcitonin:4,lacticidven:4)  ) Recent Results (from the past 240 hour(s))  Culture, Urine     Status: Abnormal   Collection Time: 11/30/19 12:59 PM   Specimen: Urine, Random  Result Value Ref Range Status   Specimen Description URINE, RANDOM  Final   Special Requests   Final    NONE Performed at [facility], [address], [place], [place] [address]    Culture >=100,000 COLONIES/mL KLEBSIELLA PNEUMONIAE (A)  Final   Report Status 12/02/2019 FINAL  Final   Organism ID, Bacteria KLEBSIELLA PNEUMONIAE (A)  Final      Susceptibility   Klebsiella pneumoniae - MIC*    AMPICILLIN >=32 RESISTANT Resistant     CEFAZOLIN <=4 SENSITIVE Sensitive     CEFEPIME <=0.12 SENSITIVE Sensitive     CEFTRIAXONE <=0.25 SENSITIVE Sensitive     CIPROFLOXACIN <=0.25 SENSITIVE Sensitive     GENTAMICIN <=1 SENSITIVE Sensitive     IMIPENEM <=0.25 SENSITIVE Sensitive     NITROFURANTOIN 128  RESISTANT Resistant     TRIMETH/SULFA <=20 SENSITIVE Sensitive     AMPICILLIN/SULBACTAM 4 SENSITIVE Sensitive     PIP/TAZO <=4 SENSITIVE Sensitive     * >=100,000 COLONIES/mL KLEBSIELLA PNEUMONIAE  Gastrointestinal Panel by PCR , Stool     Status: Abnormal   Collection Time: 12/03/19  7:00 PM   Specimen: Rectum; Stool  Result Value Ref Range Status   Campylobacter species NOT DETECTED NOT DETECTED Final   Plesimonas shigelloides NOT DETECTED NOT DETECTED Final   Salmonella species DETECTED (A) NOT DETECTED Final    Comment: RESULT CALLED TO, READ BACK BY AND VERIFIED WITH: JESSICA NIDS 12/04/19 1553 KLW    Yersinia enterocolitica NOT DETECTED NOT DETECTED Final   Vibrio species NOT DETECTED NOT DETECTED Final   Vibrio cholerae NOT DETECTED NOT DETECTED Final   Enteroaggregative E coli (EAEC) NOT DETECTED NOT DETECTED Final   Enteropathogenic E coli (EPEC) NOT DETECTED NOT DETECTED Final   Enterotoxigenic E coli (ETEC) NOT  DETECTED NOT DETECTED Final   Shiga like toxin producing E coli (STEC) NOT DETECTED NOT DETECTED Final   Shigella/Enteroinvasive E coli (EIEC) NOT DETECTED NOT DETECTED Final   Cryptosporidium NOT DETECTED NOT DETECTED Final   Cyclospora cayetanensis NOT DETECTED NOT DETECTED Final   Entamoeba histolytica NOT DETECTED NOT DETECTED Final   Giardia lamblia NOT DETECTED NOT DETECTED Final   Adenovirus F40/41 NOT DETECTED NOT DETECTED Final   Astrovirus NOT DETECTED NOT DETECTED Final   Norovirus GI/GII NOT DETECTED NOT DETECTED Final   Rotavirus A NOT DETECTED NOT DETECTED Final   Sapovirus (I, II, IV, and V) NOT DETECTED NOT DETECTED Final    Comment: Performed at [facility], [address] [place], [place] [address]  Culture, blood (Routine X 2) w Reflex to ID Panel     Status: None (Preliminary result)   Collection Time: 12/04/19  9:35 AM   Specimen: BLOOD RIGHT HAND  Result Value Ref Range Status   Specimen Description BLOOD RIGHT HAND  Final    Special Requests   Final    BOTTLES DRAWN AEROBIC AND ANAEROBIC Blood Culture adequate volume   Culture   Final    NO GROWTH < 24 HOURS Performed at [facility], [address], [place], [place] [address]    Report Status PENDING  Incomplete  Culture, blood (Routine X 2) w Reflex to ID Panel     Status: None (Preliminary result)   Collection Time: 12/04/19  9:42 AM   Specimen: BLOOD LEFT HAND  Result Value Ref Range Status   Specimen Description BLOOD LEFT HAND  Final   Special Requests   Final    BOTTLES DRAWN AEROBIC AND ANAEROBIC Blood Culture adequate volume   Culture   Final    NO GROWTH < 24 HOURS Performed at [facility], [place] [address], [place], [place] [address]    Report Status PENDING  Incomplete         Radiology Studies: DG Chest 2 View  Result Date: 12/04/2019 CLINICAL DATA:  Fevers EXAM: CHEST - 2 VIEW COMPARISON:  11/14/2018 FINDINGS: Cardiac shadow is stable. Aortic calcifications are again seen. Scarring is noted in the subpleural regions bilaterally stable from the prior exam. No focal infiltrate or sizable effusion is seen. No bony abnormality is noted. IMPRESSION: Scarring bilaterally stable from the prior study. No acute abnormality noted. Electronically Signed   By: Inez Catalina M.D.   On: 12/04/2019 09:04        Scheduled Meds: . apixaban  5 mg Oral BID  . Gerhardt's butt cream   Topical QID  . sodium chloride flush  3 mL Intravenous Q12H   Continuous Infusions: . sodium chloride 250 mL (12/05/19 0958)  . cefTRIAXone (ROCEPHIN)  IV Stopped (12/05/19 1032)     LOS: 12 days    Time spent: 19min    Domenic Polite, MD [facility]  12/05/2019, 3:34 PM

## 2019-12-05 NOTE — Progress Notes (Signed)
Physical Therapy Session Note  Patient Details  Name: [name] MRN: [id number] Date of Birth: 03-29-1931  Today's Date: 12/05/2019     Short Term Goals: Week 2:  PT Short Term Goal 1 (Week 2): STG=LTG due ELOS  Skilled Therapeutic Interventions/Progress Updates:   Pt remained on bed rest following MD orders placed on 11/9. Will re-attempt once bed rest orders as lifted and pt medically stable for PT.      Therapy Documentation Precautions:  Precautions Precautions: Fall Precaution Comments: watch BP, dizziness Restrictions Weight Bearing Restrictions: No Pain: Pain Assessment Pain Scale: 0-10 Pain Score: 0-No pain    Therapy/Group: Individual Therapy  [name] 12/05/2019, 10:31 AM

## 2019-12-05 NOTE — Progress Notes (Signed)
Speech Language Pathology Weekly Progress and Session Note  Patient Details  Name: [name] MRN: [id number] Date of Birth: 10/14/1931  Beginning of progress report period: November 26, 2019 End of progress report period: December 05, 2019  Today's Date: 12/05/2019 SLP Individual Time: 1300-1340 SLP Individual Time Calculation (min): 40 min  Short Term Goals: Week 1: SLP Short Term Goal 1 (Week 1): Pt will recall daily events and novel information with supervision A verbal cues for use of external/internal aids. SLP Short Term Goal 1 - Progress (Week 1): Progressing toward goal SLP Short Term Goal 2 (Week 1): Pt will complete basic-mildly complex problem solving tasks with min A verbal cues. SLP Short Term Goal 2 - Progress (Week 1): Met SLP Short Term Goal 3 (Week 1): Pt will self-monitory and self-correct errors during problem solving tasks with mod A verbal cues. SLP Short Term Goal 3 - Progress (Week 1): Met SLP Short Term Goal 4 (Week 1): Pt will demonstrate sustained attention in 15 minute intervals during mildly complex problem solving tasks with supervision A verbal cues for redirection. SLP Short Term Goal 4 - Progress (Week 1): Met    New Short Term Goals: Week 2: SLP Short Term Goal 1 (Week 2): STG=LTG due to remaining LOS  Weekly Progress Updates: Pt has made functional gains and met 3 out of 4 short term goals. Pt is currently ~ Min assist for due to basic to mildly complex tasks due to cognitive impairments impacting her problem solving, emergent awareness, attention, and short term memory. Pt has also been limited in her ability to participate in therapies over the last few days due to medical instability, loose stools and salmonella infection, leading to a medical hold and increased pt fatigue. Pt has demonstrated improved attention, recall of daily information, and basic problem solving. Pt and family education is ongoing. Pt would continue to benefit from skilled ST  while inpatient in order to maximize functional independence and reduce burden of care prior to discharge. Anticipate that pt will need 24/7 supervision at discharge in addition to [place] follow up at next level of care.     Intensity: Minumum of 1-2 x/day, 30 to 90 minutes Frequency: 3 to 5 out of 7 days Duration/Length of Stay: 12/08/19 pending medical stability/clearance Treatment/Interventions: Cognitive remediation/compensation;Cueing hierarchy;Functional tasks;Internal/external aids;Patient/family education   Daily Session  Skilled Therapeutic Interventions: Pt was seen for skilled ST targeting cognitive goals. Pt's daughter present for session. SLP facilitated session with familiar semi-complex checkbook balancing activity to assess carryover of cognitive-linguistic skills and strategies previously discussed and targeted, given this is a task pt used to complete Mod I at baseline. She required overall Min A verbal and visual cues for error awareness and problem solving throughout task (slightly decreased level of cueing in comparison to last time this was targeted). Discussed need for children to check behind her to ensure checkbook balancing accuracy at discharge. Pt and daughter in agreement. Pt also required Supervision A verbal cues for redirection to tasks due to external distractions. During a transfer from recliner to bed, she required Min A verbal cues for safety awareness and sequencing, in addition to attention. Pt left laying in bed with alarm set and need within reach, daughter still present.  Continue per current plan of care.     Pain Pain Assessment Pain Scale: 0-10 Pain Score: 0-No pain  Therapy/Group: Individual Therapy  Arbutus Leas 12/05/2019, 1:42 PM

## 2019-12-05 NOTE — Patient Care Conference (Signed)
Inpatient RehabilitationTeam Conference and Plan of Care Update Date: 12/05/2019   Time: 10:17 AM    Patient Name: [name]      Medical Record Number: [id number]  Date of Birth: 12-05-1931 Sex: Female         Room/Bed: 4W10C/4W10C-01 Payor Info: Payor: MEDICARE / Plan: MEDICARE PART A AND B / Product Type: *No Product type* /    Admit Date/Time:  11/23/2019  1:34 PM  Primary Diagnosis:  Acute ischemic right PCA stroke [facility])  Hospital Problems: Principal Problem:   Acute ischemic right PCA stroke ([place]) s/p tPA and mechanical thrombectomy, d/t AF not on [facility]    Expected Discharge Date: Expected Discharge Date: 12/08/19 (pending labs)  Team Members Present: Physician leading conference: Dr. Alysia Penna Care Coodinator Present: Dorien Chihuahua, RN, BSN, CRRN;Christina Sampson Goon, BSW Nurse Present: Benjie Karvonen, RN PT Present: Phylliss Bob, PTA OT Present: Turner Daniels, OT SLP Present: Jettie Booze, CF-SLP PPS Coordinator present : [name], PT     Current Status/Progress Goal Weekly Team Focus  Bowel/Bladder   Pt continues with loose stools lomotil ordered prn Pt remains continent/incontinent of urine using [place] pads PTA. Occasional bowel incontinence d/t diarrhea. LBM 11/09  pt will have normal stools and bowel pattern. Pt will have less episodes of incontinence of urine.  time toilet q2-3h and prn continue with lomotil prn   Swallow/Nutrition/ Hydration             ADL's   medical hold on 11/9, based on CAREtool pt is Min/CGA with LB bathe and dress, ambulated during ADLs Min/CGA, toilet transfer & toileting tasks CGA  Supervision  dynamic standing balance, ADL transfers, ADL retraining, BUE strength, fam ed per note   Mobility   CGA to supervision bed mobilty, supervision transfers, supervision gait with [place] as of 11/6, has been limited by fatigue, N/V since 11/8  supervision overall  BLE strengthening, endurance as tolerated, gait  mechanics, dynamic balance   Communication             Safety/Cognition/ Behavioral Observations  Min A basic - minimal progress this week, also missed 2 sessions due to medical issues  Min-Supervision  recall of new and daily info, emergent awareness, mildly complex problem solving tasks, attention during higher level tasks, education   Pain   Patient denies any pain  pt will remain free of pain  assess pt for pain qshift and prn medicate as ordered and follow up for relief   Skin   Pt's right anterior upper thigh less edema ecchymosis improving at puncture site. MASD perianal/groin region gerhardts ordered  continued improvement of hematoma no further skin breakdown or infection. Improvement of MASD  assess skin qshift and prn gerhardts as ordered     Discharge Planning:  Discharging home with husband and children (primary:Stephanie) Has: [place]. 1 Level home (1 step to enter, R side railings)   Team Discussion: Gastroenteritis improving; blood cultures pending. Patient able to maintain adequate po intake without IVF. Continue IV abx for UTI. Min guard for lower body dressing. Hestitant to take a shower due to previous drop in blood pressure during OT session. Minimal assistance for complex problem solving and error awareness. Patient on target to meet rehab goals: yes, mod I-supervision for transfers and on track to discharge with single point cane for gait.  *See Care Plan and progress notes for long and short-term goals.   Revisions to Treatment Plan:   Teaching Needs: Toileting, transfers,  medications, etc.  Current Barriers to Discharge: None  Possible Resolutions to Barriers: Family education with daughter and son     Medical Summary Current Status: diarrhea, no nausea, maintaining hydration  Barriers to Discharge: Medical stability   Possible Resolutions to [employer] Focus: normalizing bowel movements, checking orthostatics, manage tachy card, IM  consult   Continued Need for Acute Rehabilitation Level of Care: The patient requires daily medical management by a physician with specialized training in physical medicine and rehabilitation for the following reasons: Direction of a multidisciplinary physical rehabilitation program to maximize functional independence : Yes Medical management of patient stability for increased activity during participation in an intensive rehabilitation regime.: Yes Analysis of laboratory values and/or radiology reports with any subsequent need for medication adjustment and/or medical intervention. : Yes   I attest that I was present, lead the team conference, and concur with the assessment and plan of the team.   Dorien Chihuahua B 12/05/2019, 2:37 PM

## 2019-12-05 NOTE — Progress Notes (Signed)
Occupational Therapy Discharge Summary  Patient Details  Name: [name] MRN: [id number] Date of Birth: Aug 05, 1931  Patient has met 36 of 11 long term goals due to improved activity tolerance, improved balance, ability to compensate for deficits, improved attention, improved awareness and improved coordination.  Patient to discharge at overall Supervision level for BADLs grossly with Patient's daughters attended family education sessions throughout LOS. Patient to d/c home with family who is able to provide 24hr supervision/assist.  Reasons goals not met: N/A  Recommendation:  Patient will benefit from ongoing skilled OT services in home health setting to continue to advance functional skills in the area of BADL and iADL.  Equipment: No equipment provided  Reasons for discharge: treatment goals met and discharge from hospital  Patient/family agrees with progress made and goals achieved: Yes  OT Discharge Precautions/Restrictions  Precautions Precautions: Fall;Other (comment) Precaution Comments: watch BP, dizziness Restrictions Weight Bearing Restrictions: No Vital Signs Therapy Vitals Temp: 98.3 F (36.8 C) Pulse Rate: 95 Resp: 18 BP: (!) 142/82 Patient Position (if appropriate): Standing Oxygen Therapy SpO2: 96 % O2 Device: Room Air Pain Pain Assessment Pain Scale: 0-10 Pain Score: 0-No pain ADL ADL Eating: Independent Where Assessed-Eating: Chair Grooming: Supervision/safety Where Assessed-Grooming: Sitting at sink Upper Body Bathing: Supervision/safety Where Assessed-Upper Body Bathing: Shower Lower Body Bathing: Supervision/safety Where Assessed-Lower Body Bathing: Shower Upper Body Dressing: Supervision/safety Where Assessed-Upper Body Dressing: Chair Lower Body Dressing: Supervision/safety Where Assessed-Lower Body Dressing: Chair Toileting: Supervision/safety Where Assessed-Toileting: Glass blower/designer: Close supervision Toilet Transfer  Method: Counselling psychologist: Energy manager: Not assessed Social research officer, government: Close supervision Social research officer, government Method: Heritage manager: Civil engineer, contracting with back Vision Baseline Vision/History: No visual deficits Patient Visual Report: No change from baseline;Other (comment) Perception  Perception: Within Functional Limits Praxis Praxis: Intact Cognition Overall Cognitive Status: Impaired/Different from baseline Arousal/Alertness: Awake/alert Orientation Level: Oriented X4 Attention: Focused;Sustained Focused Attention: Appears intact Sustained Attention: Impaired Sustained Attention Impairment: Verbal basic;Functional basic Memory: Impaired Memory Impairment: Retrieval deficit;Decreased recall of new information;Decreased short term memory Awareness Impairment: Emergent impairment Problem Solving: Impaired Problem Solving Impairment: Functional basic;Verbal basic Executive Function: Organizing Organizing: Impaired Organizing Impairment: Verbal basic;Functional basic Safety/Judgment: Appears intact Sensation Sensation Light Touch: Appears Intact Hot/Cold: Not tested Proprioception: Appears Intact Stereognosis: Not tested Coordination Gross Motor Movements are Fluid and Coordinated: Yes Fine Motor Movements are Fluid and Coordinated: No Coordination and Movement Description: impaired due to mild L hemiparesis and impaired balance although greatly improved since initial eval Finger Nose Finger Test: mild dysmetria in LUE although improved since initial eval Motor  Motor Motor: Other (comment) Motor - Discharge Observations: significantly improved but slight L hemiparesis Mobility  Bed Mobility Bed Mobility: Supine to Sit;Sit to Supine Supine to Sit: Supervision/Verbal cueing Sit to Supine: Supervision/Verbal cueing Transfers Sit to Stand: Supervision/Verbal cueing Stand to Sit: Supervision/Verbal cueing   Trunk/Postural Assessment  Cervical Assessment Cervical Assessment: Exceptions to [facility] (forward head) Thoracic Assessment Thoracic Assessment: Exceptions to [facility] (thoracic kyphosis) Lumbar Assessment Lumbar Assessment: Exceptions to [facility] (posterior pelvic tilt in sitting) Postural Control Postural Control: Deficits on evaluation Postural Limitations: significantly improved  with no noticeable L lean but still decreased  Balance Balance Balance Assessed: Yes Static Sitting Balance Static Sitting - Balance Support: Feet supported Static Sitting - Level of Assistance: 6: Modified independent (Device/Increase time) Dynamic Sitting Balance Dynamic Sitting - Balance Support: Feet supported Dynamic Sitting - Level of Assistance: 6: Modified independent (Device/Increase time) Static Standing Balance Static Standing -  Balance Support: During functional activity Static Standing - Level of Assistance: 6: Modified independent (Device/Increase time) Dynamic Standing Balance Dynamic Standing - Balance Support: During functional activity Dynamic Standing - Level of Assistance: 5: Stand by assistance Extremity/Trunk Assessment RUE Assessment RUE Assessment: Within Functional Limits LUE Assessment LUE Assessment: Exceptions to [facility] General Strength Comments: generalized weakness although improved since initial eval LUE Body System: Neuro Brunstrum levels for arm and hand: Arm;Hand Brunstrum level for arm: Stage V Relative Independence from Synergy Brunstrum level for hand: Stage VI Isolated joint movements   [name] 12/10/2019, 7:21 AM

## 2019-12-05 NOTE — Progress Notes (Signed)
Physical Therapy Session Note  Patient Details  Name: [name] MRN: [id number] Date of Birth: 1931-05-24  Today's Date: 12/05/2019 PT Individual Time: 2458-0998 PT Individual Time Calculation (min): 42 min   Short Term Goals: Week 2:  PT Short Term Goal 1 (Week 2): STG=LTG due ELOS  Skilled Therapeutic Interventions/Progress Updates: Pt presented transferring over to [facility] with NT. PTA observed HHA for transfer with pt having incontintent BM (thus pt placed back on enteric precautions). Performed STS with CGA and NT performed peri-care total A with PTA assisting to don brief. Pt then performed ambulatory transfer to recliner approx 28f with SPC and CGA. Participated in seated LE therex as follows with extended rests between bouts due to fatigue. Pt performed LAQ and seated march with 2.5lb wts x20. Hip abd/add AROM and ankle pumps to fatigue (approx 20 ea). Pt remained in w/c at end of session and left with seat alarm on, call bell within reach and current needs met.      Therapy Documentation Precautions:  Precautions Precautions: Fall Precaution Comments: watch BP, dizziness Restrictions Weight Bearing Restrictions: No General:   Vital Signs: Therapy Vitals Temp: 98.1 F (36.7 C) Pulse Rate: 89 Resp: 20 BP: (!) 109/57 Patient Position (if appropriate): Lying Oxygen Therapy SpO2: 98 % O2 Device: Room Air Pain: Pain Assessment Pain Scale: Faces Faces Pain Scale: No hurt    Therapy/Group: Individual Therapy  Vedha Tercero  Khyren Hing, PTA  12/05/2019, 4:06 PM

## 2019-12-05 NOTE — Progress Notes (Signed)
Pt has been more alert throughout the night. Pt ambulated with RW 2 assist to BR. Tolerated fair with short rest breaks in between. Pt taking water without any difficulty. Pt has had stable VS throughout this shift. Pt only had one bout of loose stools tonight. Lomotil was given after BM. Pt isn't as pale and skin is warm and dry. Pt denies any pain but states she is weak but " I am determined to move since I was in bed all day yesterday!"

## 2019-12-06 ENCOUNTER — Inpatient Hospital Stay (HOSPITAL_COMMUNITY): Payer: Medicare Other | Admitting: Physical Therapy

## 2019-12-06 ENCOUNTER — Inpatient Hospital Stay (HOSPITAL_COMMUNITY): Payer: Medicare Other | Admitting: Speech Pathology

## 2019-12-06 ENCOUNTER — Inpatient Hospital Stay (HOSPITAL_COMMUNITY): Payer: Medicare Other | Admitting: Occupational Therapy

## 2019-12-06 LAB — CBC WITH DIFFERENTIAL/PLATELET
Abs Immature Granulocytes: 0.03 10*3/uL (ref 0.00–0.07)
Basophils Absolute: 0 10*3/uL (ref 0.0–0.1)
Basophils Relative: 0 %
Eosinophils Absolute: 0.1 10*3/uL (ref 0.0–0.5)
Eosinophils Relative: 1 %
HCT: 32.8 % — ABNORMAL LOW (ref 36.0–46.0)
Hemoglobin: 10.5 g/dL — ABNORMAL LOW (ref 12.0–15.0)
Immature Granulocytes: 0 %
Lymphocytes Relative: 22 %
Lymphs Abs: 1.6 10*3/uL (ref 0.7–4.0)
MCH: 31.5 pg (ref 26.0–34.0)
MCHC: 32 g/dL (ref 30.0–36.0)
MCV: 98.5 fL (ref 80.0–100.0)
Monocytes Absolute: 0.9 10*3/uL (ref 0.1–1.0)
Monocytes Relative: 13 %
Neutro Abs: 4.4 10*3/uL (ref 1.7–7.7)
Neutrophils Relative %: 64 %
Platelets: 231 10*3/uL (ref 150–400)
RBC: 3.33 MIL/uL — ABNORMAL LOW (ref 3.87–5.11)
RDW: 14.1 % (ref 11.5–15.5)
WBC: 7 10*3/uL (ref 4.0–10.5)
nRBC: 0 % (ref 0.0–0.2)

## 2019-12-06 LAB — BASIC METABOLIC PANEL
Anion gap: 10 (ref 5–15)
BUN: 13 mg/dL (ref 8–23)
CO2: 24 mmol/L (ref 22–32)
Calcium: 8.7 mg/dL — ABNORMAL LOW (ref 8.9–10.3)
Chloride: 103 mmol/L (ref 98–111)
Creatinine, Ser: 0.77 mg/dL (ref 0.44–1.00)
GFR, Estimated: >60 mL/min (ref >60)
Glucose, Bld: 114 mg/dL — ABNORMAL HIGH (ref 70–99)
Potassium: 3.5 mmol/L (ref 3.5–5.1)
Sodium: 137 mmol/L (ref 135–145)

## 2019-12-06 LAB — MAGNESIUM: Magnesium: 1.9 mg/dL (ref 1.7–2.4)

## 2019-12-06 NOTE — Progress Notes (Signed)
Recreational Therapy Session Note  Patient Details  Name: [name] MRN: [id number] Date of Birth: August 14, 1931 Today's Date: 12/06/2019  Pain: no c/o Skilled Therapeutic Interventions/Progress Updates: Pt seated in w/c upon entry, nursing at bedside.  Pt smiling & reported that she felt much better than the previous days. Session focused on discharge planning, review of social, emotional, spiritual, cognitive health in addition to physical health & their impact on health & wellness/recovery.  Pt stated understanding.  Pt and daughter sharing concerns with discharge plan, feeling that pt needs extra time due to being on hold the last few days.  Supportive listening provided.  Reviewed team conference note with both and explained current level per therapy documentation and therapy goals.  Both stated understanding but reiterated concerns.  Therapy team made aware.  Therapy/Group: Individual Therapy   [name] 12/06/2019, 12:46 PM

## 2019-12-06 NOTE — Progress Notes (Signed)
PROGRESS NOTE    [name]  ZHY:[id number] DOB: 03-12-1931 DOA: 11/23/2019 PCP: Crist Infante, MD  Brief Narrative: [name] is a pleasant 86 yo female with PMH A. fib, sciatica who was initially hospitalized from 11/13/2019 until 11/23/2019 for an acute right PCA stroke requiring TPA and mechanical thrombectomy.  Hospitalization complicated with a postprocedural right common femoral artery pseudoaneurysm with hematoma requiring percutaneous thrombin injection on 11/16/2019 followed by repeat thrombin injection on 11/21/2019. She was then discharged to inpatient rehab. -Just completed a course of Augmentin for pneumonia just prior to this admission  -While in rehab on 11/5 developed dysuria, abnormal urinalysis started Keflex, urine culture grew Klebsiella which is sensitive to cephalosporins, subsequently on 11/9 developed fever of 102 with diarrhea and lethargy, Keflex was changed to Rocephin and TRH was consulted   Assessment & Plan:   Principal Problem:   Acute ischemic right PCA stroke ([place]) s/p tPA and mechanical thrombectomy, d/t AF not on [facility] -Continue therapy at rehab -Continue Eliquis  Klebsiella UTI -Initially treated with Keflex from 11/5, now on ceftriaxone, stop Abx tomorrow, see below  Salmonella gastroenteritis -On 11/9 developed fever and acute diarrhea, GI pathogen panel positive for Salmonella -day 3 of ceftraixone -Clinically improved,  -stop Abx tomorrow assuming blood cx stays negative  DVT prophylaxis: Apixaban Code Status: Full code Family Communication: Daughter at bedside Disposition Plan: home per Rehab    Consultants:      Procedures:   Antimicrobials:    Subjective: -feels tired, no nausea, vomiting, diarrhea Denies dyspnea  Objective: Vitals:   12/05/19 0405 12/05/19 1356 12/05/19 2026 12/06/19 0402  BP: 116/60 (!) 109/57 (!) 108/56 126/67  Pulse: (!) 103 89 60 69  Resp: 16 20 15 16   Temp: 98.4 F (36.9 C) 98.1 F (36.7 C) 98.2  F (36.8 C) 98 F (36.7 C)  TempSrc:   Oral Oral  SpO2: 94% 98% 96% 98%  Weight:      Height:        Intake/Output Summary (Last 24 hours) at 12/06/2019 1503 Last data filed at 12/06/2019 1230 Gross per 24 hour  Intake 238.03 ml  Output --  Net 238.03 ml   Filed Weights   11/28/19 1616 11/29/19 1347 11/30/19 1000  Weight: 63.2 kg 63.5 kg 63.2 kg    Examination:  General exam: AAOx3, no distress CVS: S1S2/RRR Lungs: CTAB Abd: soft, NT, BS present Ext: no edema Central nervous system: Alert and oriented.  Moves all extremities, no lateralizing signs Skin: No rashes on exposed skin Psychiatry:  Mood & affect appropriate.     Data Reviewed:   CBC: Recent Labs  Lab 12/03/19 0939 12/04/19 0943 12/05/19 0416 12/06/19 0700  WBC 6.7 10.9* 9.0 7.0  NEUTROABS  --  9.3* 6.8 4.4  HGB 11.5* 12.2 11.7* 10.5*  HCT 36.2 38.7 36.8 32.8*  MCV 100.8* 98.7 100.8* 98.5  PLT 319 307 280 295   Basic Metabolic Panel: Recent Labs  Lab 12/03/19 0939 12/04/19 0943 12/05/19 0416 12/06/19 0700  NA 137 136 135 137  K 3.6 3.2* 4.0 3.5  CL 104 104 104 103  CO2 26 21* 22 24  GLUCOSE 130* 137* 117* 114*  BUN 13 13 16 13   CREATININE 0.83 0.78 0.90 0.77  CALCIUM 8.7* 8.7* 8.7* 8.7*  MG  --   --  1.9 1.9   GFR: Estimated Creatinine Clearance: 41.9 mL/min (by C-G formula based on SCr of 0.77 mg/dL). Liver Function Tests: Recent Labs  Lab 12/03/19 1803  12/04/19 0943  AST 29 24  ALT 21 23  ALKPHOS 65 73  BILITOT 1.1 1.3*  PROT 6.2* 6.7  ALBUMIN 3.1* 3.2*   No results for input(s): LIPASE, AMYLASE in the last 168 hours. No results for input(s): AMMONIA in the last 168 hours. Coagulation Profile: No results for input(s): INR, PROTIME in the last 168 hours. Cardiac Enzymes: No results for input(s): CKTOTAL, CKMB, CKMBINDEX, TROPONINI in the last 168 hours. BNP (last 3 results) No results for input(s): PROBNP in the last 8760 hours. HbA1C: No results for input(s): HGBA1C  in the last 72 hours. CBG: Recent Labs  Lab 11/29/19 1640 11/30/19 1139 11/30/19 1627 12/01/19 1129 12/01/19 1621  GLUCAP 97 113* 101* 113* 105*   Lipid Profile: No results for input(s): CHOL, HDL, LDLCALC, TRIG, CHOLHDL, LDLDIRECT in the last 72 hours. Thyroid Function Tests: No results for input(s): TSH, T4TOTAL, FREET4, T3FREE, THYROIDAB in the last 72 hours. Anemia Panel: No results for input(s): VITAMINB12, FOLATE, FERRITIN, TIBC, IRON, RETICCTPCT in the last 72 hours. Urine analysis:    Component Value Date/Time   COLORURINE AMBER (A) 11/30/2019 0928   APPEARANCEUR CLOUDY (A) 11/30/2019 0928   LABSPEC 1.021 11/30/2019 0928   PHURINE 5.0 11/30/2019 0928   GLUCOSEU NEGATIVE 11/30/2019 0928   HGBUR SMALL (A) 11/30/2019 0928   BILIRUBINUR NEGATIVE 11/30/2019 0928   KETONESUR NEGATIVE 11/30/2019 0928   PROTEINUR 30 (A) 11/30/2019 0928   NITRITE POSITIVE (A) 11/30/2019 0928   LEUKOCYTESUR LARGE (A) 11/30/2019 0928   Sepsis Labs: @LABRCNTIP (procalcitonin:4,lacticidven:4)  ) Recent Results (from the past 240 hour(s))  Culture, Urine     Status: Abnormal   Collection Time: 11/30/19 12:59 PM   Specimen: Urine, Random  Result Value Ref Range Status   Specimen Description URINE, RANDOM  Final   Special Requests   Final    NONE Performed at [facility], [place] [address] [place], [place] [address]    Culture >=100,000 COLONIES/mL KLEBSIELLA PNEUMONIAE (A)  Final   Report Status 12/02/2019 FINAL  Final   Organism ID, Bacteria KLEBSIELLA PNEUMONIAE (A)  Final      Susceptibility   Klebsiella pneumoniae - MIC*    AMPICILLIN >=32 RESISTANT Resistant     CEFAZOLIN <=4 SENSITIVE Sensitive     CEFEPIME <=0.12 SENSITIVE Sensitive     CEFTRIAXONE <=0.25 SENSITIVE Sensitive     CIPROFLOXACIN <=0.25 SENSITIVE Sensitive     GENTAMICIN <=1 SENSITIVE Sensitive     IMIPENEM <=0.25 SENSITIVE Sensitive     NITROFURANTOIN 128 RESISTANT Resistant     TRIMETH/SULFA <=20 SENSITIVE  Sensitive     AMPICILLIN/SULBACTAM 4 SENSITIVE Sensitive     PIP/TAZO <=4 SENSITIVE Sensitive     * >=100,000 COLONIES/mL KLEBSIELLA PNEUMONIAE  Gastrointestinal Panel by PCR , Stool     Status: Abnormal   Collection Time: 12/03/19  7:00 PM   Specimen: Rectum; Stool  Result Value Ref Range Status   Campylobacter species NOT DETECTED NOT DETECTED Final   Plesimonas shigelloides NOT DETECTED NOT DETECTED Final   Salmonella species DETECTED (A) NOT DETECTED Final    Comment: RESULT CALLED TO, READ BACK BY AND VERIFIED WITH: JESSICA NIDS 12/04/19 1553 KLW    Yersinia enterocolitica NOT DETECTED NOT DETECTED Final   Vibrio species NOT DETECTED NOT DETECTED Final   Vibrio cholerae NOT DETECTED NOT DETECTED Final   Enteroaggregative E coli (EAEC) NOT DETECTED NOT DETECTED Final   Enteropathogenic E coli (EPEC) NOT DETECTED NOT DETECTED Final   Enterotoxigenic E  coli (ETEC) NOT DETECTED NOT DETECTED Final   Shiga like toxin producing E coli (STEC) NOT DETECTED NOT DETECTED Final   Shigella/Enteroinvasive E coli (EIEC) NOT DETECTED NOT DETECTED Final   Cryptosporidium NOT DETECTED NOT DETECTED Final   Cyclospora cayetanensis NOT DETECTED NOT DETECTED Final   Entamoeba histolytica NOT DETECTED NOT DETECTED Final   Giardia lamblia NOT DETECTED NOT DETECTED Final   Adenovirus F40/41 NOT DETECTED NOT DETECTED Final   Astrovirus NOT DETECTED NOT DETECTED Final   Norovirus GI/GII NOT DETECTED NOT DETECTED Final   Rotavirus A NOT DETECTED NOT DETECTED Final   Sapovirus (I, II, IV, and V) NOT DETECTED NOT DETECTED Final    Comment: Performed at [facility], [address] [place], [place] [address]  Culture, blood (Routine X 2) w Reflex to ID Panel     Status: None (Preliminary result)   Collection Time: 12/04/19  9:35 AM   Specimen: BLOOD RIGHT HAND  Result Value Ref Range Status   Specimen Description BLOOD RIGHT HAND  Final   Special Requests   Final    BOTTLES DRAWN AEROBIC AND  ANAEROBIC Blood Culture adequate volume   Culture   Final    NO GROWTH 2 DAYS Performed at [facility], [place] [address], [place], [place] [address]    Report Status PENDING  Incomplete  Culture, blood (Routine X 2) w Reflex to ID Panel     Status: None (Preliminary result)   Collection Time: 12/04/19  9:42 AM   Specimen: BLOOD LEFT HAND  Result Value Ref Range Status   Specimen Description BLOOD LEFT HAND  Final   Special Requests   Final    BOTTLES DRAWN AEROBIC AND ANAEROBIC Blood Culture adequate volume   Culture   Final    NO GROWTH 2 DAYS Performed at [facility], [place] [address], [place], [place] [address]    Report Status PENDING  Incomplete     Scheduled Meds: . apixaban  5 mg Oral BID  . Gerhardt's butt cream   Topical QID  . sodium chloride flush  3 mL Intravenous Q12H   Continuous Infusions: . sodium chloride Stopped (12/06/19 1208)  . cefTRIAXone (ROCEPHIN)  IV Stopped (12/06/19 1159)     LOS: 13 days    Time spent: 43min  Domenic Polite, MD [facility]  12/06/2019, 3:03 PM

## 2019-12-06 NOTE — Progress Notes (Addendum)
Speech Language Pathology Discharge Summary  Patient Details  Name: [name] MRN: [id number] Date of Birth: [birth year]   Patient has met 3 of 4 long term goals.  Patient to discharge at overall Supervision level.  Reasons goals not met: increased cueing for semi-complex problem solving   Clinical Impression/Discharge Summary:  Pt made functional gains and met 3 out of 4 long term goals this admission. Pt currently requires Supervision-Min assist for basic familiar tasks, however increased cueing is required for mildly complex problem solving and will require 24/7 supervision at discharge for greatest safety. Pt has demonstrated improved use of compensatory memory strategies, attention, emergent awareness, and basic problem solving. However, given cognitive deficits still present and impacting functional status, recommend pt continue to receive skilled ST services upon discharge. Pt and family education is complete at this time.   Care Partner:  Caregiver Able to Provide Assistance: Yes  Type of Caregiver Assistance: Cognitive  Recommendation:  Outpatient SLP;24 hour supervision/assistance  Rationale for SLP Follow Up: Maximize cognitive function and independence;Reduce caregiver burden   Equipment: none   Reasons for discharge: Discharged from hospital   Patient/Family Agrees with Progress Made and Goals Achieved: Yes    Arbutus Leas 12/10/2019, 7:25 AM

## 2019-12-06 NOTE — Progress Notes (Signed)
[facility] PHYSICAL MEDICINE & REHABILITATION PROGRESS NOTE   Subjective/Complaints: She is feeling much better today No loose stools this morning Having some orthostasis while working with therapy Clear for activity as tolerated  ROS: Patient denies CP, SOB, N/V/D    Objective:   No results found. Recent Labs    12/05/19 0416 12/06/19 0700  WBC 9.0 7.0  HGB 11.7* 10.5*  HCT 36.8 32.8*  PLT 280 231   Recent Labs    12/05/19 0416 12/06/19 0700  NA 135 137  K 4.0 3.5  CL 104 103  CO2 22 24  GLUCOSE 117* 114*  BUN 16 13  CREATININE 0.90 0.77  CALCIUM 8.7* 8.7*    Intake/Output Summary (Last 24 hours) at 12/06/2019 0943 Last data filed at 12/05/2019 1500 Gross per 24 hour  Intake 281.71 ml  Output --  Net 281.71 ml        Physical Exam: Vital Signs Blood pressure 126/67, pulse 69, temperature 98 F (36.7 C), temperature source Oral, resp. rate 16, height 5' (1.524 m), weight 63.2 kg, SpO2 98 %. General: Alert, No apparent distress HEENT: Head is normocephalic, atraumatic, PERRLA, EOMI, sclera anicteric, oral mucosa pink and moist, dentition intact, ext ear canals clear,  Neck: Supple without JVD or lymphadenopathy Heart: Reg rate and rhythm. No murmurs rubs or gallops Chest: CTA bilaterally without wheezes, rales, or rhonchi; no distress Abdomen: Soft, non-tender, non-distended, bowel sounds positive. Extremities: No clubbing, cyanosis, or edema. Pulses are 2+  Skin: No evidence of breakdown, no evidence of rash Neurologic: Cranial nerves II through XII intact, motor strength is 4/5 in bilateral deltoid, bicep, tricep, grip, hip flexor, knee extensors, ankle dorsiflexor and plantar flexor Intact FNF and Heel shin BUE and BLE,  Mild nystagmus , horz bilaterally, EOMI   Musculoskeletal:No pain with AROM no knee or ankle eff     Assessment/Plan: 1. Functional deficits secondary to right cerebellar, frontal and thalamic infarcts with [facility] which require 3+  hours per day of interdisciplinary therapy in a comprehensive inpatient rehab setting.  Physiatrist is providing close team supervision and 24 hour management of active medical problems listed below.  Physiatrist and rehab team continue to assess barriers to discharge/monitor patient progress toward functional and medical goals  Care Tool:  Bathing    Body parts bathed by patient: Right arm, Left arm, Chest, Abdomen, Front perineal area, Buttocks, Right upper leg, Left upper leg, Right lower leg, Left lower leg, Face         Bathing assist Assist Level: Contact Guard/Touching assist     Upper Body Dressing/Undressing Upper body dressing   What is the patient wearing?: Pull over shirt    Upper body assist Assist Level: Supervision/Verbal cueing    Lower Body Dressing/Undressing Lower body dressing      What is the patient wearing?: Incontinence brief     Lower body assist Assist for lower body dressing: Contact Guard/Touching assist     Toileting Toileting    Toileting assist Assist for toileting: Contact Guard/Touching assist     Transfers Chair/bed transfer  Transfers assist     Chair/bed transfer assist level: Supervision/Verbal cueing     Locomotion Ambulation   Ambulation assist      Assist level: Supervision/Verbal cueing Assistive device: Walker-rolling Max distance: 150   Walk 10 feet activity   Assist     Assist level: Supervision/Verbal cueing Assistive device: Walker-rolling   Walk 50 feet activity   Assist    Assist level: Supervision/Verbal  cueing Assistive device: Walker-rolling    Walk 150 feet activity   Assist Walk 150 feet activity did not occur: Safety/medical concerns  Assist level: Supervision/Verbal cueing Assistive device: Walker-rolling    Walk 10 feet on uneven surface  activity   Assist     Assist level: Minimal Assistance - Patient > 75% (ramp) Assistive device: Hand held assist, Other (comment)  (railing)   Wheelchair     Assist Will patient use wheelchair at discharge?: No             Wheelchair 50 feet with 2 turns activity    Assist            Wheelchair 150 feet activity     Assist          Blood pressure 126/67, pulse 69, temperature 98 F (36.7 C), temperature source Oral, resp. rate 16, height 5' (1.524 m), weight 63.2 kg, SpO2 98 %.  Medical Problem List and Plan: 1.  Deficits with mobility, endurance, self-care secondary to acute infarct in right cerebellum, right frontal cortex, right thalamus with mild SAH.         hold therapy today due to fever /w/u- ask IM to eval              -ELOS/Goals: 11/11             -Continue CIR therapies including PT, OT, and SLP  2.  Antithrombotics: -DVT/anticoagulation:  Pharmaceutical: Other (comment)--Eliquis             -antiplatelet therapy: N/A 3. Right groin pain/Pain Management: Tylenol prn secondary to hematoma. Well controlled 4. Mood: LCSW to follow for evaluation and support.              -antipsychotic agents: N/A 5. Neuropsych: This patient is capable of making decisions on her own behalf. 6. Skin/Wound Care: local care to right groin. Routine pressure relief measures.  7. Fluids/Electrolytes/Nutrition: Encourage intake.  CMP ordered.I: 757ml- pt states she is drinking well, Cr 0.77 on 11/11 HypoK improved with supplementation 3.5 on 11/11 8. XFG:[id number] BP tid    Vitals:   12/05/19 2026 12/06/19 0402  BP: (!) 108/56 126/67  Pulse: 60 69  Resp: 15 16  Temp: 98.2 F (36.8 C) 98 F (36.7 C)  SpO2: 96% 98%  11/11: BP soft off hypertensives 9. A fib: HR controlled off metoprolol.  Eliquis resumed 10/28.              Heart rates increasing off metoprolol 10. Right groin pseudoaneurysm: Still has hematoma which is improving- recheck CBC 11. Acute blood loss anemia:              CBC ordered. 12.  Chronic UTIs/urinary frequency: UA positive- keflex started. UC showing >100K Kleb P- will  switch to ceftriaxone  -PVR scan yesterday was 76cc  -purewick catheter for HS frequency very helpful 13. Slow transit constipation: managed with colace and miralax.              Adjust all meds as necessary   -Having regular BM 14.  No clinical evidence of PNA PNA some chronic interstitial changes on xray , no leukocytosis, afebrile without cough 15. Fever - likely Salmonella, await BC, appreciate IM consult LOS: 13 days A FACE TO FACE EVALUATION WAS PERFORMED  Clide Deutscher Mumtaz Lovins 12/06/2019, 9:43 AM

## 2019-12-06 NOTE — Progress Notes (Signed)
Speech Language Pathology Daily Session Note  Patient Details  Name: [name] MRN: [id number] Date of Birth: 04-Aug-1931  Today's Date: 12/07/2019 SLP Individual Time: 0800-0856 SLP Individual Time Calculation (min): 56 min  Short Term Goals: Week 2: SLP Short Term Goal 1 (Week 2): STG=LTG due to remaining LOS  Skilled Therapeutic Interventions: Pt was seen for skilled ST targeting cognitive goals. SLP facilitated session with administration of the SLUMs evaluation as means of post-test for this admission. She scored 19/30 today indicative of moderate neurocognitive deficits primarily in areas of short term memory, executive function, and problem solving. She did use association as compensatory memory strategy Mod I to recall paragraph information. She also required Min A verbal and visual cueing for semi-complex problem solving during a Ecologist task (subtest from [employer]), but only Supervision A for basic problem solving during mobility with RW from bed to restroom. Pt left sitting in wheelchair with seat alarm in place, daughter, RN and MD present. Continue per current plan of care.          Pain Pain Assessment Pain Scale: 0-10 Pain Score: 0-No pain  Therapy/Group: Individual Therapy  Arbutus Leas 12/07/2019, 9:47 AM

## 2019-12-06 NOTE — Progress Notes (Signed)
Physical Therapy Session Note  Patient Details  Name: [name] MRN: [id number] Date of Birth: Jun 04, 1931  Today's Date: 12/06/2019 PT Individual Time:1702-1732 PT Individual Time Calculation (min): 30 min   Short Term Goals: Week 2:  PT Short Term Goal 1 (Week 2): STG=LTG due ELOS  Skilled Therapeutic Interventions/Progress Updates:   Pt received supine in bed and agreeable to PT. Supine>sit transfer with CGA, increased time and cues for sequencing to scoot to EOB.   Sit<>stand with SPC and and min assist from EOB,  Min assist gait training with SPC due to mild LOB to the L x 15f .  Pt then performed gait training with RW in room x 17fwith supervision assist and then over multiple obstacles in floor and over 4 inch step with CGA-supervision assist. Daughter educated on present functional status given medical instability following gastritis.   Pt returned to room and performed stand pivot transfer to bed with supervision assist and RW. Sit>supine completed with increased time and supervision assist, and left supine in bed with call bell in reach and all needs met.        Therapy Documentation Precautions:  Precautions Precautions: Fall Precaution Comments: watch BP, dizziness Restrictions Weight Bearing Restrictions: No General: PT Amount of Missed Time (min): 15 Minutes PT Missed Treatment Reason: Nursing care Vital Signs: Therapy Vitals Temp: 98 F (36.7 C) Pulse Rate: 82 Resp: 18 BP: (!) 94/52 Oxygen Therapy SpO2: 97 % O2 Device: Room Air Pain: Pain Assessment Pain Scale: 0-10 Pain Score: 0-No pain    Therapy/Group: Individual Therapy  AuLorie Phenix1/11/2019, 5:14 PM

## 2019-12-06 NOTE — Progress Notes (Signed)
Physical Therapy Session Note  Patient Details  Name: [name] MRN: [id number] Date of Birth: Sep 03, 1931  Today's Date: 12/06/2019 PT Individual Time: 0815-0900 PT Individual Time Calculation (min): 45 min   Short Term Goals: Week 2:  PT Short Term Goal 1 (Week 2): STG=LTG due ELOS   Skilled Therapeutic Interventions/Progress Updates:    Pt received semi-reclined in bed beginning to eat her breakfast with daughter in room. Pt requesting to sit in [facility] to eat this morning and requested time to finish food (~25min). PT assisted pt in stand pivot transfer with SPC from bed to chair with CGA and verbal cues to reach back for her WC. Pt does not report any pain and that she does not need to use the restroom at this time.   -Alternating toe taps x8 on 4 in step with SPC and min assist for balance and weight shift onto RLE for LLE foot clearance. Pt reports lightheadedness and needs to sit down following 8th rep.   Pt orthostatics taken while nurse and physician in the room to administer medication and follow up with patient about current set back. Pt vitals as follows: -sitting: BP (120/66); P 78 -standing: BP (117/70); P 89  Continued with 2 sets of alternating toe taps as stated above following nurse and physician departure. Pt reports that she feels much weaker, but can tell she has been improving slowly over the last few days.   Ample seated rest breaks given throughout session due to decreased endurance noted. Pt performed ~10 sit<>stands with CGA using SPC throughout session in order to perform tasks.   Therapy Documentation Precautions:  Precautions Precautions: Fall Precaution Comments: watch BP, dizziness Restrictions Weight Bearing Restrictions: No    Therapy/Group: Individual Therapy  Gaylord Shih 12/06/2019, 8:26 AM

## 2019-12-06 NOTE — Progress Notes (Signed)
Occupational Therapy Session Note  Patient Details  Name: ANAE HAMS MRN: [id number] Date of Birth: [birth year]  Today's Date: 12/06/2019 OT Individual Time: 1018-1100 OT Individual Time Calculation (min): 42 min    Short Term Goals: Week 2:  OT Short Term Goal 1 (Week 2): STG = LTG 2/2 ELOS  Skilled Therapeutic Interventions/Progress Updates:  Patient met lying supine in bed in agreement with OT treatment session. 0/10 pain at rest and with activity. Patient with desire for bathing/dressing at sink level. Supervision A for functional mobility to commode in bathroom, toilet transfer and toileting/hygiene/clothing management with assist from daughter for family education. Patient/family education on potential use of BSC at night initially upon d/c home to increase safety and independence. Bathing/dressing at sink level with supervision to Riley guard. Daughter with continued hesitance about patient's progress toward goals. Re-education on LTG and CLOF. Daughter with concern about distance from bed to bathroom at home. Room set-up to simulate distance from EOB at home to commode in bathroom with patient able to walk distance with close supervision A and use of SPC. Session concluded with patient seated in wc with daughter present at bedside and all needs met.   Therapy Documentation Precautions:  Precautions Precautions: Fall Precaution Comments: watch BP, dizziness Restrictions Weight Bearing Restrictions: No General:    Therapy/Group: Individual Therapy  Tabbatha Bordelon R Howerton-Davis 12/06/2019, 7:31 AM

## 2019-12-07 ENCOUNTER — Inpatient Hospital Stay (HOSPITAL_COMMUNITY): Payer: Medicare Other | Admitting: Physical Therapy

## 2019-12-07 ENCOUNTER — Inpatient Hospital Stay (HOSPITAL_COMMUNITY): Payer: Medicare Other | Admitting: Occupational Therapy

## 2019-12-07 ENCOUNTER — Inpatient Hospital Stay (HOSPITAL_COMMUNITY): Payer: Medicare Other | Admitting: Speech Pathology

## 2019-12-07 LAB — CBC WITH DIFFERENTIAL/PLATELET
Abs Immature Granulocytes: 0.04 10*3/uL (ref 0.00–0.07)
Basophils Absolute: 0 10*3/uL (ref 0.0–0.1)
Basophils Relative: 0 %
Eosinophils Absolute: 0.2 10*3/uL (ref 0.0–0.5)
Eosinophils Relative: 3 %
HCT: 35.1 % — ABNORMAL LOW (ref 36.0–46.0)
Hemoglobin: 11.1 g/dL — ABNORMAL LOW (ref 12.0–15.0)
Immature Granulocytes: 1 %
Lymphocytes Relative: 30 %
Lymphs Abs: 2.4 10*3/uL (ref 0.7–4.0)
MCH: 31.5 pg (ref 26.0–34.0)
MCHC: 31.6 g/dL (ref 30.0–36.0)
MCV: 99.7 fL (ref 80.0–100.0)
Monocytes Absolute: 0.7 10*3/uL (ref 0.1–1.0)
Monocytes Relative: 9 %
Neutro Abs: 4.6 10*3/uL (ref 1.7–7.7)
Neutrophils Relative %: 57 %
Platelets: 276 10*3/uL (ref 150–400)
RBC: 3.52 MIL/uL — ABNORMAL LOW (ref 3.87–5.11)
RDW: 14.3 % (ref 11.5–15.5)
WBC: 8 10*3/uL (ref 4.0–10.5)
nRBC: 0 % (ref 0.0–0.2)

## 2019-12-07 LAB — BASIC METABOLIC PANEL
Anion gap: 10 (ref 5–15)
BUN: 10 mg/dL (ref 8–23)
CO2: 24 mmol/L (ref 22–32)
Calcium: 9 mg/dL (ref 8.9–10.3)
Chloride: 106 mmol/L (ref 98–111)
Creatinine, Ser: 0.81 mg/dL (ref 0.44–1.00)
GFR, Estimated: >60 mL/min (ref >60)
Glucose, Bld: 112 mg/dL — ABNORMAL HIGH (ref 70–99)
Potassium: 3.4 mmol/L — ABNORMAL LOW (ref 3.5–5.1)
Sodium: 140 mmol/L (ref 135–145)

## 2019-12-07 LAB — MAGNESIUM: Magnesium: 2 mg/dL (ref 1.7–2.4)

## 2019-12-07 MED ORDER — POTASSIUM CHLORIDE 20 MEQ PO PACK
40.0000 meq | PACK | Freq: Once | ORAL | Status: AC
  Administered 2019-12-07: 40 meq via ORAL
  Filled 2019-12-07: qty 2

## 2019-12-07 NOTE — Progress Notes (Signed)
[place] PHYSICAL MEDICINE & REHABILITATION PROGRESS NOTE   Subjective/Complaints: Continues to feel much better.  Had one loose stool today. Hgb trending upward Daughter is at bedside, agreeable to d/c Monday  ROS: Patient denies CP, SOB, N/V/D    Objective:   No results found. Recent Labs    12/06/19 0700 12/07/19 0345  WBC 7.0 8.0  HGB 10.5* 11.1*  HCT 32.8* 35.1*  PLT 231 276   Recent Labs    12/06/19 0700 12/07/19 0345  NA 137 140  K 3.5 3.4*  CL 103 106  CO2 24 24  GLUCOSE 114* 112*  BUN 13 10  CREATININE 0.77 0.81  CALCIUM 8.7* 9.0    Intake/Output Summary (Last 24 hours) at 12/07/2019 1204 Last data filed at 12/06/2019 1230 Gross per 24 hour  Intake 120.03 ml  Output --  Net 120.03 ml        Physical Exam: Vital Signs Blood pressure (!) 109/55, pulse 100, temperature 98.3 F (36.8 C), temperature source Oral, resp. rate 18, height 5' (1.524 m), weight 63.2 kg, SpO2 100 %. General: Alert, No apparent distress HEENT: Head is normocephalic, atraumatic, PERRLA, EOMI, sclera anicteric, oral mucosa pink and moist, dentition intact, ext ear canals clear,  Neck: Supple without JVD or lymphadenopathy Heart: Reg rate and rhythm. No murmurs rubs or gallops Chest: CTA bilaterally without wheezes, rales, or rhonchi; no distress Abdomen: Soft, non-tender, non-distended, bowel sounds positive. Extremities: No clubbing, cyanosis, or edema. Pulses are 2+  Skin: No evidence of breakdown, no evidence of rash Neurologic: Cranial nerves II through XII intact, motor strength is 4/5 in bilateral deltoid, bicep, tricep, grip, hip flexor, knee extensors, ankle dorsiflexor and plantar flexor Intact FNF and Heel shin BUE and BLE,  Mild nystagmus , horz bilaterally, EOMI   Musculoskeletal:No pain with AROM no knee or ankle eff     Assessment/Plan: 1. Functional deficits secondary to right cerebellar, frontal and thalamic infarcts with [facility] which require 3+ hours  per day of interdisciplinary therapy in a comprehensive inpatient rehab setting.  Physiatrist is providing close team supervision and 24 hour management of active medical problems listed below.  Physiatrist and rehab team continue to assess barriers to discharge/monitor patient progress toward functional and medical goals  Care Tool:  Bathing    Body parts bathed by patient: Right arm, Left arm, Chest, Abdomen, Front perineal area, Buttocks, Right upper leg, Left upper leg, Right lower leg, Left lower leg, Face         Bathing assist Assist Level: Contact Guard/Touching assist     Upper Body Dressing/Undressing Upper body dressing   What is the patient wearing?: Pull over shirt    Upper body assist Assist Level: Supervision/Verbal cueing    Lower Body Dressing/Undressing Lower body dressing      What is the patient wearing?: Incontinence brief     Lower body assist Assist for lower body dressing: Contact Guard/Touching assist     Toileting Toileting    Toileting assist Assist for toileting: Contact Guard/Touching assist     Transfers Chair/bed transfer  Transfers assist     Chair/bed transfer assist level: Supervision/Verbal cueing     Locomotion Ambulation   Ambulation assist      Assist level: Supervision/Verbal cueing Assistive device: Walker-rolling Max distance: 150   Walk 10 feet activity   Assist     Assist level: Supervision/Verbal cueing Assistive device: Walker-rolling   Walk 50 feet activity   Assist    Assist level: Supervision/Verbal  cueing Assistive device: Walker-rolling    Walk 150 feet activity   Assist Walk 150 feet activity did not occur: Safety/medical concerns  Assist level: Supervision/Verbal cueing Assistive device: Walker-rolling    Walk 10 feet on uneven surface  activity   Assist     Assist level: Minimal Assistance - Patient > 75% (ramp) Assistive device: Hand held assist, Other (comment)  (railing)   Wheelchair     Assist Will patient use wheelchair at discharge?: No             Wheelchair 50 feet with 2 turns activity    Assist            Wheelchair 150 feet activity     Assist          Blood pressure (!) 109/55, pulse 100, temperature 98.3 F (36.8 C), temperature source Oral, resp. rate 18, height 5' (1.524 m), weight 63.2 kg, SpO2 100 %.  Medical Problem List and Plan: 1.  Deficits with mobility, endurance, self-care secondary to acute infarct in right cerebellum, right frontal cortex, right thalamus with mild SAH.         hold therapy today due to fever /w/u- ask IM to eval              -ELOS/Goals: 11/11             -Contine CIR therapies including PT, OT, and SLP  2.  Antithrombotics: -DVT/anticoagulation:  Pharmaceutical: Other (comment)--Eliquis             -antiplatelet therapy: N/A 3. Right groin pain/Pain Management: Tylenol prn secondary to hematoma. Well controlled.  4. Mood: LCSW to follow for evaluation and support.              -antipsychotic agents: N/A 5. Neuropsych: This patient is capable of making decisions on her own behalf. 6. Skin/Wound Care: local care to right groin. Routine pressure relief measures.  7. Fluids/Electrolytes/Nutrition: Encourage intake.  CMP ordered.I: 722ml- pt states she is drinking well, Cr 0.77 on 11/11 HypoK improved with supplementation 3.5 on 11/11, 3.4 on 3/12- will supplement additional 22meq 8. WUJ:[id number] BP tid    Vitals:   12/06/19 1943 12/07/19 0343  BP: 128/71 (!) 109/55  Pulse: 90 100  Resp: 18 18  Temp: 98.2 F (36.8 C) 98.3 F (36.8 C)  SpO2: 98% 100%  11/12: BP soft off hypertensives, 109/55 this AM 9. A fib: HR controlled off metoprolol.  Eliquis resumed 10/28.              Heart rates increasing off metoprolol 10. Right groin pseudoaneurysm: Still has hematoma which is improving- recheck CBC 11. Acute blood loss anemia:              CBC ordered. 12.  Chronic  UTIs/urinary frequency: UA positive- keflex started. UC showing >100K Kleb P- will switch to ceftriaxone  -PVR scan yesterday was 76cc  -purewick catheter for HS frequency very helpful 13. Slow transit constipation: managed with colace and miralax.              Adjust all meds as necessary   -Having regular BM 14.  No clinical evidence of PNA PNA some chronic interstitial changes on xray , no leukocytosis, afebrile without cough 15. Fever - +salmonella, resolving, cleared for therapy outside of room LOS: 14 days A FACE TO Harold 12/07/2019, 12:04 PM

## 2019-12-07 NOTE — Progress Notes (Signed)
PROGRESS NOTE    [name]  YQI:[id number] DOB: 1931-10-24 DOA: 11/23/2019 PCP: Crist Infante, MD  Brief Narrative: Ms. Sonntag is a pleasant 86 yo female with PMH A. fib, sciatica who was initially hospitalized from 11/13/2019 until 11/23/2019 for an acute right PCA stroke requiring TPA and mechanical thrombectomy.  Hospitalization complicated with a postprocedural right common femoral artery pseudoaneurysm with hematoma requiring percutaneous thrombin injection on 11/16/2019 followed by repeat thrombin injection on 11/21/2019. She was then discharged to inpatient rehab. -Just completed a course of Augmentin for pneumonia just prior to this admission  -While in rehab on 11/5 developed dysuria, abnormal urinalysis started Keflex, urine culture grew Klebsiella which is sensitive to cephalosporins, subsequently on 11/9 developed fever of 102 with diarrhea and lethargy, Keflex was changed to Rocephin and TRH was consulted   Assessment & Plan:   Principal Problem:   Acute ischemic right PCA stroke ([place]) s/p tPA and mechanical thrombectomy, d/t AF not on [facility] -Continue therapy at rehab -Continue Eliquis  Klebsiella UTI -Initially treated with Keflex from 11/5, now on ceftriaxone, stop Abx today, see below  Salmonella gastroenteritis -On 11/9 developed fever and acute diarrhea, GI pathogen panel positive for Salmonella -day 4 of ceftraixone -Clinically improved, blood Cx are negative -stop Abx today  DVT prophylaxis: Apixaban Code Status: Full code Family Communication: Daughter at bedside Disposition Plan: home per Rehab    Consultants:      Procedures:   Antimicrobials:    Subjective: -feels better today, no diarrhea, eating better today  Objective: Vitals:   12/06/19 0402 12/06/19 1551 12/06/19 1943 12/07/19 0343  BP: 126/67 (!) 94/52 128/71 (!) 109/55  Pulse: 69 82 90 100  Resp: 16 18 18 18   Temp: 98 F (36.7 C) 98 F (36.7 C) 98.2 F (36.8 C) 98.3 F (36.8 C)    TempSrc: Oral  Oral Oral  SpO2: 98% 97% 98% 100%  Weight:      Height:        Intake/Output Summary (Last 24 hours) at 12/07/2019 1534 Last data filed at 12/07/2019 1235 Gross per 24 hour  Intake 238 ml  Output --  Net 238 ml   Filed Weights   11/28/19 1616 11/29/19 1347 11/30/19 1000  Weight: 63.2 kg 63.5 kg 63.2 kg    Examination:  General exam: AAOx3, no distress CVS: S1s2/RRR Lungs: CTAB Abd: soft, NT, BS present Ext: no edema Central nervous system: Alert and oriented.  Moves all extremities, no lateralizing signs Skin: No rashes on exposed skin Psychiatry:  Mood & affect appropriate.     Data Reviewed:   CBC: Recent Labs  Lab 12/03/19 0939 12/04/19 0943 12/05/19 0416 12/06/19 0700 12/07/19 0345  WBC 6.7 10.9* 9.0 7.0 8.0  NEUTROABS  --  9.3* 6.8 4.4 4.6  HGB 11.5* 12.2 11.7* 10.5* 11.1*  HCT 36.2 38.7 36.8 32.8* 35.1*  MCV 100.8* 98.7 100.8* 98.5 99.7  PLT 319 307 280 231 387   Basic Metabolic Panel: Recent Labs  Lab 12/03/19 0939 12/04/19 0943 12/05/19 0416 12/06/19 0700 12/07/19 0345  NA 137 136 135 137 140  K 3.6 3.2* 4.0 3.5 3.4*  CL 104 104 104 103 106  CO2 26 21* 22 24 24   GLUCOSE 130* 137* 117* 114* 112*  BUN 13 13 16 13 10   CREATININE 0.83 0.78 0.90 0.77 0.81  CALCIUM 8.7* 8.7* 8.7* 8.7* 9.0  MG  --   --  1.9 1.9 2.0   GFR: Estimated Creatinine Clearance: 41.4 mL/min (by  C-G formula based on SCr of 0.81 mg/dL). Liver Function Tests: Recent Labs  Lab 12/03/19 1803 12/04/19 0943  AST 29 24  ALT 21 23  ALKPHOS 65 73  BILITOT 1.1 1.3*  PROT 6.2* 6.7  ALBUMIN 3.1* 3.2*   No results for input(s): LIPASE, AMYLASE in the last 168 hours. No results for input(s): AMMONIA in the last 168 hours. Coagulation Profile: No results for input(s): INR, PROTIME in the last 168 hours. Cardiac Enzymes: No results for input(s): CKTOTAL, CKMB, CKMBINDEX, TROPONINI in the last 168 hours. BNP (last 3 results) No results for input(s): PROBNP  in the last 8760 hours. HbA1C: No results for input(s): HGBA1C in the last 72 hours. CBG: Recent Labs  Lab 12/01/19 1129 12/01/19 1621  GLUCAP 113* 105*   Lipid Profile: No results for input(s): CHOL, HDL, LDLCALC, TRIG, CHOLHDL, LDLDIRECT in the last 72 hours. Thyroid Function Tests: No results for input(s): TSH, T4TOTAL, FREET4, T3FREE, THYROIDAB in the last 72 hours. Anemia Panel: No results for input(s): VITAMINB12, FOLATE, FERRITIN, TIBC, IRON, RETICCTPCT in the last 72 hours. Urine analysis:    Component Value Date/Time   COLORURINE AMBER (A) 11/30/2019 0928   APPEARANCEUR CLOUDY (A) 11/30/2019 0928   LABSPEC 1.021 11/30/2019 0928   PHURINE 5.0 11/30/2019 0928   GLUCOSEU NEGATIVE 11/30/2019 0928   HGBUR SMALL (A) 11/30/2019 0928   BILIRUBINUR NEGATIVE 11/30/2019 0928   KETONESUR NEGATIVE 11/30/2019 0928   PROTEINUR 30 (A) 11/30/2019 0928   NITRITE POSITIVE (A) 11/30/2019 0928   LEUKOCYTESUR LARGE (A) 11/30/2019 0928   Sepsis Labs: @LABRCNTIP (procalcitonin:4,lacticidven:4)  ) Recent Results (from the past 240 hour(s))  Culture, Urine     Status: Abnormal   Collection Time: 11/30/19 12:59 PM   Specimen: Urine, Random  Result Value Ref Range Status   Specimen Description URINE, RANDOM  Final   Special Requests   Final    NONE Performed at [facility], [place] [address] [place], [place] [address]    Culture >=100,000 COLONIES/mL KLEBSIELLA PNEUMONIAE (A)  Final   Report Status 12/02/2019 FINAL  Final   Organism ID, Bacteria KLEBSIELLA PNEUMONIAE (A)  Final      Susceptibility   Klebsiella pneumoniae - MIC*    AMPICILLIN >=32 RESISTANT Resistant     CEFAZOLIN <=4 SENSITIVE Sensitive     CEFEPIME <=0.12 SENSITIVE Sensitive     CEFTRIAXONE <=0.25 SENSITIVE Sensitive     CIPROFLOXACIN <=0.25 SENSITIVE Sensitive     GENTAMICIN <=1 SENSITIVE Sensitive     IMIPENEM <=0.25 SENSITIVE Sensitive     NITROFURANTOIN 128 RESISTANT Resistant     TRIMETH/SULFA <=20  SENSITIVE Sensitive     AMPICILLIN/SULBACTAM 4 SENSITIVE Sensitive     PIP/TAZO <=4 SENSITIVE Sensitive     * >=100,000 COLONIES/mL KLEBSIELLA PNEUMONIAE  Gastrointestinal Panel by PCR , Stool     Status: Abnormal   Collection Time: 12/03/19  7:00 PM   Specimen: Rectum; Stool  Result Value Ref Range Status   Campylobacter species NOT DETECTED NOT DETECTED Final   Plesimonas shigelloides NOT DETECTED NOT DETECTED Final   Salmonella species DETECTED (A) NOT DETECTED Final    Comment: RESULT CALLED TO, READ BACK BY AND VERIFIED WITH: JESSICA NIDS 12/04/19 1553 KLW    Yersinia enterocolitica NOT DETECTED NOT DETECTED Final   Vibrio species NOT DETECTED NOT DETECTED Final   Vibrio cholerae NOT DETECTED NOT DETECTED Final   Enteroaggregative E coli (EAEC) NOT DETECTED NOT DETECTED Final   Enteropathogenic E coli (EPEC) NOT  DETECTED NOT DETECTED Final   Enterotoxigenic E coli (ETEC) NOT DETECTED NOT DETECTED Final   Shiga like toxin producing E coli (STEC) NOT DETECTED NOT DETECTED Final   Shigella/Enteroinvasive E coli (EIEC) NOT DETECTED NOT DETECTED Final   Cryptosporidium NOT DETECTED NOT DETECTED Final   Cyclospora cayetanensis NOT DETECTED NOT DETECTED Final   Entamoeba histolytica NOT DETECTED NOT DETECTED Final   Giardia lamblia NOT DETECTED NOT DETECTED Final   Adenovirus F40/41 NOT DETECTED NOT DETECTED Final   Astrovirus NOT DETECTED NOT DETECTED Final   Norovirus GI/GII NOT DETECTED NOT DETECTED Final   Rotavirus A NOT DETECTED NOT DETECTED Final   Sapovirus (I, II, IV, and V) NOT DETECTED NOT DETECTED Final    Comment: Performed at [facility], [address] [place], [place] [address]  Culture, blood (Routine X 2) w Reflex to ID Panel     Status: None (Preliminary result)   Collection Time: 12/04/19  9:35 AM   Specimen: BLOOD RIGHT HAND  Result Value Ref Range Status   Specimen Description BLOOD RIGHT HAND  Final   Special Requests   Final    BOTTLES DRAWN  AEROBIC AND ANAEROBIC Blood Culture adequate volume   Culture   Final    NO GROWTH 3 DAYS Performed at [facility], [place] [address] [place], [place] [address]    Report Status PENDING  Incomplete  Culture, blood (Routine X 2) w Reflex to ID Panel     Status: None (Preliminary result)   Collection Time: 12/04/19  9:42 AM   Specimen: BLOOD LEFT HAND  Result Value Ref Range Status   Specimen Description BLOOD LEFT HAND  Final   Special Requests   Final    BOTTLES DRAWN AEROBIC AND ANAEROBIC Blood Culture adequate volume   Culture   Final    NO GROWTH 3 DAYS Performed at [facility], [place] [address] [place],  [address]    Report Status PENDING  Incomplete     Scheduled Meds: . apixaban  5 mg Oral BID  . Gerhardt's butt cream   Topical QID  . sodium chloride flush  3 mL Intravenous Q12H   Continuous Infusions: . sodium chloride 250 mL (12/07/19 1517)  . cefTRIAXone (ROCEPHIN)  IV 1 g (12/07/19 1521)     LOS: 14 days    Time spent: 18min  Domenic Polite, MD [facility]  12/07/2019, 3:34 PM

## 2019-12-07 NOTE — Progress Notes (Signed)
Patient ID: ROBYN GALATI, female   DOB: [birth year], 86 y.o.   MRN: [id number]   SW informed that patient have been extended until Monday 11/15, due to not reaching prior level of function. SW has informed daughter. Daughter is ok with extension. Daughter will follow up with sw if they already have rw available in home. Sw will follow up with [facility] for [facility] follow up.   [place], [place]

## 2019-12-07 NOTE — Progress Notes (Signed)
Recreational Therapy Session Note  Patient Details  Name: [name] MRN: [id number] Date of Birth: [birth year] Today's Date: 12/07/2019  Pain: no c/o Skilled Therapeutic Interventions/Progress Updates: Session focused on discharge planning including pt concerns, review of holistic health and things she could do to maintain holistic health with supervision/cues.  Pt also ambulated with RW ~10-20' with RW with close supervision, stood for ball toss activity with close supervision for 10-15 tosses x2.    Therapy/Group: Co-Treatment  Marelyn Rouser 12/07/2019, 12:41 PM

## 2019-12-07 NOTE — Discharge Summary (Signed)
Physician Discharge Summary  Patient ID: [name] MRN: [id number] DOB/AGE: [birth year] 86 y.o.  Admit date: 11/23/2019 Discharge date: 12/10/2019  Discharge Diagnoses:  Principal Problem:   Acute ischemic right PCA stroke ([place]) s/p tPA and mechanical thrombectomy, d/t AF not on [facility] Active Problems:   Atrial fibrillation (H[facility])   Pseudoaneurysm of femoral artery following procedure ([place])   Acute blood loss anemia   Chronic UTI   Discharged Condition: stable.   Significant Diagnostic Studies: DG Chest 2 View  Result Date: 12/04/2019 CLINICAL DATA:  Fevers EXAM: CHEST - 2 VIEW COMPARISON:  11/14/2018 FINDINGS: Cardiac shadow is stable. Aortic calcifications are again seen. Scarring is noted in the subpleural regions bilaterally stable from the prior exam. No focal infiltrate or sizable effusion is seen. No bony abnormality is noted. IMPRESSION: Scarring bilaterally stable from the prior study. No acute abnormality noted. Electronically Signed   By: Inez Catalina M.D.   On: 12/04/2019 09:04   C   Labs:  Basic Metabolic Panel: Recent Labs  Lab 12/06/19 0700 12/07/19 0345 12/08/19 0558 12/09/19 0559  NA 137 140 139 140  K 3.5 3.4* 3.7 4.2  CL 103 106 107 104  CO2 24 24 23 26   GLUCOSE 114* 112* 107* 106*  BUN 13 10 8  6*  CREATININE 0.77 0.81 0.68 0.75  CALCIUM 8.7* 9.0 8.6* 8.8*  MG 1.9 2.0  --   --     CBC: Recent Labs  Lab 12/07/19 0345 12/08/19 0558 12/09/19 0559  WBC 8.0 6.8 6.7  NEUTROABS 4.6 4.2 3.6  HGB 11.1* 10.6* 11.1*  HCT 35.1* 33.1* 35.1*  MCV 99.7 98.2 97.8  PLT 276 278 288    CBG: No results for input(s): GLUCAP in the last 168 hours.  Brief HPI:   [name] is a 86 y.o. RH-female with history of A fib/A flutter-- ff AC due to NSR, recent treatment for PNA who was admitted on 11/13/2019 with left hemiparesis and right gaze preference.  CTA head/neck showed proximal right PCA occlusion with emphysema and layering left pleural effusion  compatible with PNA.  She underwent cerebral angio with revascularization of R-PCI by Dr. Earleen Newport.  MRI/MRA brain done revealing right thalamic infarct with mild SAH in interpeduncular cistern and small areas of acute infarction right cerebellum and right frontal cortex.  She was started on Augmentin and completed 7-day course of antibiotics on 10/26.    Postop course significant for development of right groin hematoma due to femoral CFA pseudoaneurysm.  She underwent ultrasound-guided thrombin injection with good results on 10/22 and Eliquis added on 10/24.  She had progressive drop in H&H with hemoglobin to 8.8 and was found to have recurrent pseudoaneurysm which was treated with thrombin injection 10/27 with postprocedure ultrasound showing closed pseudoaneurysm therefore Eliquis resumed.  Therapy was ongoing however patient was noted to have deficits in ADLs and mobility.  CIR was recommended for follow-up therapy.   Hospital Course: [name] was admitted to rehab 11/23/2019 for inpatient therapies to consist of PT, ST and OT at least three hours five days a week. Past admission physiatrist, therapy team and rehab RN have worked together to provide customized collaborative inpatient rehab. Her blood pressures and heart were monitored on TID basis. She did have orthostatic changes initially due to immobility and as endurance improved, her blood pressures have stabilized. Heart rate has been controlled off metoprolol.   She reported increase in urinary symptoms on 11/05 and was started on Keflex for  Klebsiella Pneumoniae UTI. She did develop loose stools on 11/08 which was negative for  C diff. She developed fever of 102 with lethargy on 11/09 am and was pancultured.   [facility] were consulted to help with medical management. Labs done revealing hypokalemia as well as mild leucocytosis.  Antibiotic changed to IV rocephin and work up revealed salmonella gastroenteritis which was treated wit  supportive care. Blood cultured were negative and she defervesced with improvement of symptoms therefore IV antibiotics d/c after 4 days. Serial check of labs showed that transient hypokalemia and leucocytosis has resolved. Renal status is stable with improvement in intake and diarrhea had resolved. She is voiding without difficulty.  Right groin hematoma is resolving and ABLA has steadily improved with Hgb up to 11.1. She has made steady gains during her stay and supervision is recommended for safety. she will continue to receive follow up [place] PT and OT by by [place].    Rehab course: During patient's stay in rehab weekly team conferences were held to monitor patient's progress, set goals and discuss barriers to discharge. At admission, patient required mod assist with ADL tasks and min assist with mobility. She exhibited moderate impairments with mildly complex tasks. She  has had improvement in activity tolerance, balance, postural control as well as ability to compensate for deficits. She has had improvement in functional use RLE as well as improvement in awareness. she requires supervision for ADL tasks and for mobility. She requires supervision to min assist with basic familiar tasks and increased cues with mildly complex tasks. Family education was completed.   Discharge disposition: 01-Home or Self Care  Diet: Heart Healthy.   Special Instructions: 1. No driving or strenuous activity till cleared by MD.   Discharge Instructions    Ambulatory referral to Neurology   Complete by: As directed    An appointment is requested in approximately: 2-3 weeks   Ambulatory referral to Physical Medicine Rehab   Complete by: As directed    1-2 weeks TC appt     Allergies as of 12/10/2019      Reactions   Bee Venom Anaphylaxis   Mixed Vespid Venom Anaphylaxis   Codeine Other (See Comments)   Not known   Sulfa Antibiotics Itching, Nausea Only   Erythromycin Nausea And Vomiting       Medication List    STOP taking these medications   docusate sodium 100 MG capsule Commonly known as: COLACE   EPINEPHrine 0.3 mg/0.3 mL Soaj injection Commonly known as: EPI-PEN   polyethylene glycol 17 g packet Commonly known as: MIRALAX / GLYCOLAX   senna-docusate 8.6-50 MG tablet Commonly known as: Senokot-S     TAKE these medications   acetaminophen 325 MG tablet Commonly known as: TYLENOL Take 2 tablets (650 mg total) by mouth every 4 (four) hours as needed for mild pain (or temp > 37.5 C (99.5 F)).   apixaban 5 MG Tabs tablet Commonly known as: ELIQUIS Take 1 tablet (5 mg total) by mouth 2 (two) times daily.   BIOTIN PO Take 1 tablet by mouth daily.   bisacodyl 10 MG suppository Commonly known as: DULCOLAX Place 1 suppository (10 mg total) rectally daily as needed for moderate constipation.   CALCIUM PO Take 1 tablet by mouth daily.   CO Q-10 PO Take 1 tablet by mouth daily.   vitamin D3 50 MCG (2000 UT) Caps Take 2,000 Units by mouth daily.       Follow-up Information  Kirsteins, Luanna Salk, MD Follow up.   Specialty: Physical Medicine and Rehabilitation Why: office will call with follow up appointment Contact information: [place] [address] [phone]        Crist Infante, MD. Call.   Specialty: Internal Medicine Why: for post  hospital follow up Contact information: [place] [address] [phone]        [place]. Call.   Why: for stroke follow up Contact information: [address]     [place] [id number] [phone]              Signed: Bary Leriche 12/12/2019, 4:31 PM

## 2019-12-07 NOTE — Progress Notes (Signed)
Patient ID: [name], female   DOB: [birth year], 86 y.o.   MRN: [id number]   Rolling Walker ordered through [address].  [place], [place]

## 2019-12-07 NOTE — Progress Notes (Signed)
Occupational Therapy Session Note  Patient Details  Name: [name] MRN: [id number] Date of Birth: September 12, 1931  Today's Date: 12/07/2019 OT Individual Time: 1300-1400 OT Individual Time Calculation (min): 60 min    Short Term Goals: Week 2:  OT Short Term Goal 1 (Week 2): STG = LTG 2/2 ELOS  Skilled Therapeutic Interventions/Progress Updates:  Patient met seated in wc in agreement with OT treatment session. 0/10 pain reported at rest and with activity. Patient in agreement with bathing in ADL bathroom 2/2 claustrophobia. Total A for wc transport room <> ADL bathroom for time management and energy conservation. Education on use of tub bench for safety. Seated on tub bench, patient able to bathe UB/LB with supervision A and increased time. UB dressing with supervision A and LB dressing with assist to don R sock and CGA for safety in standing. Oldest daughter present throughout treatment session for family education. Session concluded with patient seated in wc with daughter present at bedside. Patient/family discussion on CLOF, progress toward goals, and continued recommendation for 24hr supervision/assist upon return home. Patient and family expressed verbal understanding.   Therapy Documentation Precautions:  Precautions Precautions: Fall Precaution Comments: watch BP, dizziness Restrictions Weight Bearing Restrictions: No General:     Therapy/Group: Individual Therapy  [name] 12/07/2019, 3:18 PM

## 2019-12-07 NOTE — Progress Notes (Signed)
Physical Therapy Session Note  Patient Details  Name: [name] MRN: [id number] Date of Birth: Jan 06, 1932  Today's Date: 12/07/2019 PT Individual Time: 1002-1110 PT Individual Time Calculation (min): 68 min   Short Term Goals: Week 2:  PT Short Term Goal 1 (Week 2): STG=LTG due ELOS  Skilled Therapeutic Interventions/Progress Updates:   Pt received sitting in WC and agreeable to PT. Recreational therapist present throughout session.  Pt transported to rehab gym in [facility]. Gait training with RW 2 x 42f with supervision assist and cues for increased step length in turns. Pt performed gait training with SPC x 30 ft with CGA and min cues intermittently for AD management and increased step length. Pt reports increased fear of falling with SPC vs RW on this day.  Stair management training with RW to ascend 1 step and simulate access to home. Supervision assist from PT and min-mod cues for AD management and proper step-to gait pattern. Dynamic gait training with RW to perform figure 8 x 2 with supervision assist and min cues to attend task.   Car transfer training with RW and supervision assist with cues for sit>pivot technique and proper UE placement.   Dynamic balance training with ball toss and no UE support 2 x 1 min with supervision assist and mild increased lateral lean to the L.   Gait training through hall x 1254fwith supervision assist and mi ncues for direction and increased step length intermittently. Pt returned to room and performed ambulatory transfer to bed with RW and supervision assist. Sit>supine completed with supervision assist, and left supine in bed with call bell in reach and all needs met.           Therapy Documentation Precautions:  Precautions Precautions: Fall Precaution Comments: watch BP, dizziness Restrictions Weight Bearing Restrictions: No General:   Vital Signs: Therapy Vitals Temp: (!) 97.5 F (36.4 C) Temp Source: Oral Pulse Rate: 72 Resp:  17 BP: 115/61 Patient Position (if appropriate): Lying Oxygen Therapy SpO2: 93 % O2 Device: Room Air Pain: denies   Therapy/Group: Individual Therapy  AuLorie Phenix1/12/2019, 5:35 PM

## 2019-12-07 NOTE — Progress Notes (Signed)
Recreational Therapy Discharge Summary Patient Details  Name: [name] MRN: [id number] Date of Birth: 09/16/1931 Today's Date: 12/07/2019  Long term goals set: 1  Long term goals met: 1  Comments on progress toward goals: Pt has made good progress during LOS in spite of medical complications and is discharging home with family to provide/coordiante 24 hour supervision.  TR sessions/education focused on activity analysis/modificaitons, coping strategies, holistic health with supervision/cues.  Pt states understanding and is appreciative of the care received on CIR.  Reasons for discharge: discharge from hospital  Patient/family agrees with progress made and goals achieved: Yes  , 12/07/2019, 12:49 PM     

## 2019-12-08 ENCOUNTER — Inpatient Hospital Stay (HOSPITAL_COMMUNITY): Payer: Medicare Other

## 2019-12-08 LAB — CBC WITH DIFFERENTIAL/PLATELET
Abs Immature Granulocytes: 0.04 10*3/uL (ref 0.00–0.07)
Basophils Absolute: 0 10*3/uL (ref 0.0–0.1)
Basophils Relative: 0 %
Eosinophils Absolute: 0.2 10*3/uL (ref 0.0–0.5)
Eosinophils Relative: 3 %
HCT: 33.1 % — ABNORMAL LOW (ref 36.0–46.0)
Hemoglobin: 10.6 g/dL — ABNORMAL LOW (ref 12.0–15.0)
Immature Granulocytes: 1 %
Lymphocytes Relative: 26 %
Lymphs Abs: 1.8 10*3/uL (ref 0.7–4.0)
MCH: 31.5 pg (ref 26.0–34.0)
MCHC: 32 g/dL (ref 30.0–36.0)
MCV: 98.2 fL (ref 80.0–100.0)
Monocytes Absolute: 0.5 10*3/uL (ref 0.1–1.0)
Monocytes Relative: 8 %
Neutro Abs: 4.2 10*3/uL (ref 1.7–7.7)
Neutrophils Relative %: 62 %
Platelets: 278 10*3/uL (ref 150–400)
RBC: 3.37 MIL/uL — ABNORMAL LOW (ref 3.87–5.11)
RDW: 14.2 % (ref 11.5–15.5)
WBC: 6.8 10*3/uL (ref 4.0–10.5)
nRBC: 0 % (ref 0.0–0.2)

## 2019-12-08 LAB — BASIC METABOLIC PANEL
Anion gap: 9 (ref 5–15)
BUN: 8 mg/dL (ref 8–23)
CO2: 23 mmol/L (ref 22–32)
Calcium: 8.6 mg/dL — ABNORMAL LOW (ref 8.9–10.3)
Chloride: 107 mmol/L (ref 98–111)
Creatinine, Ser: 0.68 mg/dL (ref 0.44–1.00)
GFR, Estimated: >60 mL/min (ref >60)
Glucose, Bld: 107 mg/dL — ABNORMAL HIGH (ref 70–99)
Potassium: 3.7 mmol/L (ref 3.5–5.1)
Sodium: 139 mmol/L (ref 135–145)

## 2019-12-08 NOTE — Progress Notes (Signed)
Physical Therapy Discharge Summary  Patient Details  Name: [name] MRN: [id number] Date of Birth: September 24, 1931  Patient has met 9 of 9 long term goals due to improved activity tolerance, improved balance, improved postural control, increased strength, increased range of motion, improved awareness and improved coordination.  Patient to discharge at an ambulatory level Supervision using RW.   Patient's care partner is independent to provide the necessary physical and cognitive assistance at discharge.  Reasons goals not met: All goals met.  Recommendation:  Patient will benefit from ongoing skilled PT services in home health setting to continue to advance safe functional mobility, address ongoing impairments in balance, gait, endurance, strength, safety, and minimize fall risk.  Equipment: RW  Reasons for discharge: treatment goals met and discharge from hospital  Patient/family agrees with progress made and goals achieved: Yes  PT Discharge Precautions/Restrictions Precautions Precautions: Fall;Other (comment) Precaution Comments: watch BP, dizziness Restrictions Weight Bearing Restrictions: No Pain Pain Assessment Pain Scale: 0-10 Pain Score: 0-No pain Perception  Perception Perception: Within Functional Limits Praxis Praxis: Intact  Cognition Overall Cognitive Status: Impaired/Different from baseline Arousal/Alertness: Awake/alert Orientation Level: Oriented X4 Attention: Focused;Sustained Focused Attention: Appears intact Sustained Attention: Impaired Memory: Impaired Problem Solving: Impaired Safety/Judgment: Appears intact Sensation  Sensation Light Touch: Appears Intact Hot/Cold: Not tested Proprioception: Appears Intact Stereognosis: Not tested Coordination Gross Motor Movements are Fluid and Coordinated: Yes Motor  Motor Motor: Other (comment) Motor - Discharge Observations: significantly improved but slight L hemiparesis  Mobility Bed  Mobility Bed Mobility: Supine to Sit;Sit to Supine Supine to Sit: Supervision/Verbal cueing Sit to Supine: Supervision/Verbal cueing Transfers Transfers: Sit to Stand;Stand to Sit;Stand Pivot Transfers Sit to Stand: Supervision/Verbal cueing Stand to Sit: Supervision/Verbal cueing Stand Pivot Transfers: Supervision/Verbal cueing Stand Pivot Transfer Details: Verbal cues for sequencing;Verbal cues for safe use of DME/AE Transfer (Assistive device): Rolling walker Locomotion  Gait Ambulation: Yes Gait Assistance: Supervision/Verbal cueing Gait Distance (Feet): 150 Feet Assistive device: Rolling walker Gait Assistance Details: Verbal cues for safe use of DME/AE Gait Gait: Yes Gait Pattern: Impaired Gait Pattern: Narrow base of support;Trunk flexed;Decreased step length - right;Decreased step length - left Gait velocity: decreased Stairs / Additional Locomotion Stairs: Yes Stairs Assistance: Supervision/Verbal cueing Stair Management Technique: With walker Number of Stairs: 1 Height of Stairs: 6 Ramp: Supervision/Verbal cueing Curb: Supervision/Verbal cueing Wheelchair Mobility Wheelchair Mobility: No  Trunk/Postural Assessment  Cervical Assessment Cervical Assessment: Exceptions to [facility] (forward head) Thoracic Assessment Thoracic Assessment: Exceptions to [facility] (thoracic kyphosis) Lumbar Assessment Lumbar Assessment: Exceptions to [facility] (posterior pelvic tilt in sitting) Postural Control Postural Control: Deficits on evaluation Postural Limitations: significantly improved  with no noticeable L lean but still decreased  Balance Standardized Balance Assessment Standardized Balance Assessment: Berg Balance Test Berg Balance Test Sit to Stand: Able to stand  independently using hands Standing Unsupported: Able to stand 2 minutes with supervision Sitting with Back Unsupported but Feet Supported on Floor or Stool: Able to sit safely and securely 2 minutes Stand to Sit: Sits safely  with minimal use of hands Transfers: Able to transfer safely, minor use of hands Standing Unsupported with Eyes Closed: Able to stand 10 seconds with supervision (no sway this time) Standing Ubsupported with Feet Together: Able to place feet together independently and stand for 1 minute with supervision From Standing, Reach Forward with Outstretched Arm: Can reach forward >12 cm safely (5") From Standing Position, Pick up Object from Floor: Able to pick up shoe, needs supervision From Standing Position, Turn to Look  Behind Over each Shoulder: Turn sideways only but maintains balance Turn 360 Degrees: Needs close supervision or verbal cueing Standing Unsupported, Alternately Place Feet on Step/Stool: Needs assistance to keep from falling or unable to try Standing Unsupported, One Foot in Front: Able to take small step independently and hold 30 seconds Standing on One Leg: Unable to try or needs assist to prevent fall Total Score: 35 Static Sitting Balance Static Sitting - Balance Support: Feet supported Static Sitting - Level of Assistance: 6: Modified independent (Device/Increase time) Dynamic Sitting Balance Dynamic Sitting - Balance Support: Feet supported Dynamic Sitting - Level of Assistance: 6: Modified independent (Device/Increase time) Static Standing Balance Static Standing - Balance Support: During functional activity Static Standing - Level of Assistance: 5: Stand by assistance Dynamic Standing Balance Dynamic Standing - Balance Support: During functional activity Dynamic Standing - Level of Assistance: 5: Stand by assistance Extremity Assessment      RLE Assessment RLE Assessment: Within Functional Limits General Strength Comments: 5/5 assessed in sitting; pt reports no longer having pain from hematoma LLE Assessment LLE Assessment: Exceptions to [facility] Active Range of Motion (AROM) Comments: WFL General Strength Comments: assessed in sitting LLE Strength Left Hip Flexion:  4+/5 Left Knee Flexion: 4+/5 Left Knee Extension: 4+/5 Left Ankle Dorsiflexion: 4-/5 Left Ankle Plantar Flexion: 4-/5   Page Spiro, PT, DPT, CSRS  [name] 12/09/2019, 8:02 AM

## 2019-12-08 NOTE — Progress Notes (Signed)
[place] PHYSICAL MEDICINE & REHABILITATION PROGRESS NOTE   Subjective/Complaints:  Had 1 loose stool this AM- 2 normal BMs yesterday- not loose.   Feeling "great"- ready for d/c Monday.   ROS:  Pt denies SOB, abd pain, CP, N/V/C/D, and vision changes  Objective:   No results found. Recent Labs    12/07/19 0345 12/08/19 0558  WBC 8.0 6.8  HGB 11.1* 10.6*  HCT 35.1* 33.1*  PLT 276 278   Recent Labs    12/07/19 0345 12/08/19 0558  NA 140 139  K 3.4* 3.7  CL 106 107  CO2 24 23  GLUCOSE 112* 107*  BUN 10 8  CREATININE 0.81 0.68  CALCIUM 9.0 8.6*    Intake/Output Summary (Last 24 hours) at 12/08/2019 1358 Last data filed at 12/08/2019 0819 Gross per 24 hour  Intake 120 ml  Output --  Net 120 ml        Physical Exam: Vital Signs Blood pressure (!) 146/76, pulse 82, temperature 98.2 F (36.8 C), resp. rate 16, height 5' (1.524 m), weight 62.1 kg, SpO2 98 %. General: alert, sitting up in bed- appropriate, NAD HEENT: conjugate gaze Heart: RRR Chest: CTA B/L- no W/R/R- good air movement Abdomen: Soft, NT, ND, (+)BS -actually hypoactive Extremities: No clubbing, cyanosis, or edema. Pulses are 2+  Skin: No evidence of breakdown, no evidence of rash Neurologic: Cranial nerves II through XII intact, motor strength is 4/5 in bilateral deltoid, bicep, tricep, grip, hip flexor, knee extensors, ankle dorsiflexor and plantar flexor Intact FNF and Heel shin BUE and BLE,  Mild nystagmus , horz bilaterally, EOMI   Musculoskeletal:No pain with AROM no knee or ankle eff     Assessment/Plan: 1. Functional deficits secondary to right cerebellar, frontal and thalamic infarcts with [facility] which require 3+ hours per day of interdisciplinary therapy in a comprehensive inpatient rehab setting.  Physiatrist is providing close team supervision and 24 hour management of active medical problems listed below.  Physiatrist and rehab team continue to assess barriers to  discharge/monitor patient progress toward functional and medical goals  Care Tool:  Bathing    Body parts bathed by patient: Right arm, Left arm, Chest, Abdomen, Front perineal area, Buttocks, Right upper leg, Left upper leg, Right lower leg, Left lower leg, Face         Bathing assist Assist Level: Supervision/Verbal cueing     Upper Body Dressing/Undressing Upper body dressing   What is the patient wearing?: Pull over shirt    Upper body assist Assist Level: Supervision/Verbal cueing    Lower Body Dressing/Undressing Lower body dressing      What is the patient wearing?: Pants, Underwear/pull up     Lower body assist Assist for lower body dressing: Contact Guard/Touching assist     Toileting Toileting    Toileting assist Assist for toileting: Supervision/Verbal cueing     Transfers Chair/bed transfer  Transfers assist     Chair/bed transfer assist level: Supervision/Verbal cueing     Locomotion Ambulation   Ambulation assist      Assist level: Supervision/Verbal cueing Assistive device: Walker-rolling Max distance: 150   Walk 10 feet activity   Assist     Assist level: Supervision/Verbal cueing Assistive device: Walker-rolling   Walk 50 feet activity   Assist    Assist level: Supervision/Verbal cueing Assistive device: Walker-rolling    Walk 150 feet activity   Assist Walk 150 feet activity did not occur: Safety/medical concerns  Assist level: Supervision/Verbal cueing Assistive device: Walker-rolling  Walk 10 feet on uneven surface  activity   Assist     Assist level: Minimal Assistance - Patient > 75% (ramp) Assistive device: Hand held assist, Other (comment) (railing)   Wheelchair     Assist Will patient use wheelchair at discharge?: No             Wheelchair 50 feet with 2 turns activity    Assist            Wheelchair 150 feet activity     Assist          Blood pressure (!)  146/76, pulse 82, temperature 98.2 F (36.8 C), resp. rate 16, height 5' (1.524 m), weight 62.1 kg, SpO2 98 %.  Medical Problem List and Plan: 1.  Deficits with mobility, endurance, self-care secondary to acute infarct in right cerebellum, right frontal cortex, right thalamus with mild SAH.         hold therapy today due to fever /w/u- ask IM to eval              -ELOS/Goals: 11/11             -Contine CIR therapies including PT, OT, and SLP  2.  Antithrombotics: -DVT/anticoagulation:  Pharmaceutical: Other (comment)--Eliquis             -antiplatelet therapy: N/A 3. Right groin pain/Pain Management: Tylenol prn secondary to hematoma. Well controlled.  4. Mood: LCSW to follow for evaluation and support.              -antipsychotic agents: N/A 5. Neuropsych: This patient is capable of making decisions on her own behalf. 6. Skin/Wound Care: local care to right groin. Routine pressure relief measures.  7. Fluids/Electrolytes/Nutrition: Encourage intake.  CMP ordered.I: 78ml- pt states she is drinking well, Cr 0.77 on 11/11 HypoK improved with supplementation 3.5 on 11/11, 3.4 on 3/12- will supplement additional 47meq  11/13- K+ up to 3.7- con't regimen 8. WLN:[id number] BP tid    Vitals:   12/07/19 1941 12/08/19 0643  BP: 120/83 (!) 146/76  Pulse: 79 82  Resp: 16 16  Temp: 98 F (36.7 C) 98.2 F (36.8 C)  SpO2: 98% 98%  11/12: BP soft off hypertensives, 109/55 this AM 9. A fib: HR controlled off metoprolol.  Eliquis resumed 10/28.              Heart rates increasing off metoprolol 10. Right groin pseudoaneurysm: Still has hematoma which is improving- recheck CBC 11. Acute blood loss anemia:              CBC ordered. 12.  Chronic UTIs/urinary frequency: UA positive- keflex started. UC showing >100K Kleb P- will switch to ceftriaxone  -PVR scan yesterday was 76cc  -purewick catheter for HS frequency very helpful  11/13- off IV ABX 13. Slow transit constipation: managed with colace  and miralax.              Adjust all meds as necessary   -Having regular BM 14.  No clinical evidence of PNA PNA some chronic interstitial changes on xray , no leukocytosis, afebrile without cough 15. Fever - +salmonella, resolving, cleared for therapy outside of room  11/13- 1 loose BM this AM- 2 regular BMs yesterday- d/c Monday   LOS: 15 days A FACE TO FACE EVALUATION WAS PERFORMED  Derrin Currey 12/08/2019, 1:58 PM

## 2019-12-08 NOTE — Plan of Care (Signed)
  Problem: Consults Goal: RH STROKE PATIENT EDUCATION Description: See Patient Education module for education specifics  Outcome: Progressing   Problem: RH BLADDER ELIMINATION Goal: RH STG MANAGE BLADDER WITH ASSISTANCE Description: STG Manage Bladder With mod I Assistance Outcome: Progressing   Problem: RH KNOWLEDGE DEFICIT Goal: RH STG INCREASE KNOWLEDGE OF STROKE PROPHYLAXIS Description: Pt will be able to demonstrate understanding of medication regimen, dietary and lifestyle modifications to better control blood pressure and prevent further stroke with mod I assist using handouts and booklet provided. Outcome: Progressing

## 2019-12-08 NOTE — Progress Notes (Signed)
Occupational Therapy Session Note  Patient Details  Name: [name] MRN: [id number] Date of Birth: Sep 22, 1931  Today's Date: 12/08/2019 OT Individual Time: 1015-1100 OT Individual Time Calculation (min): 45 min    Short Term Goals: Week 1:  OT Short Term Goal 1 (Week 1): Pt will tolerate 1 hour session wiht no changes in vitals to demo improved activity tolerance/medical stability OT Short Term Goal 1 - Progress (Week 1): Met OT Short Term Goal 2 (Week 1): Pt Will transfer to toilet wiht CGA OT Short Term Goal 2 - Progress (Week 1): Met OT Short Term Goal 3 (Week 1): Pt will thread BLE into LB clothing wiht S OT Short Term Goal 3 - Progress (Week 1): Met OT Short Term Goal 4 (Week 1): Pt will maintain dynamic sitting balance at EOB/EOM wiht S OT Short Term Goal 4 - Progress (Week 1): Met OT Short Term Goal 5 (Week 1): Pt will complete peri hygeine with S  Focus on LTGs due to discharge plan.   Skilled Therapeutic Interventions/Progress Updates:    OT session focused on discharge planning, functional mobility, and endurance. Pt received supine in bed reporting fatigue and requiring min encouragement from daughter and OT for therapy. Discussed discharge plans and EC techniques with no further questions reported. Pt ambulated to sink, completing oral care in standing x2 min, and flossing while seated with supervision. Completed toileting with supervision as pt ambulated bed<>toilet with supervision using RW. At end of session, pt returned to bed with BP 109/71. Bed alarm on and all needs in reach.   BP supine: 109/69 BP standing: 146/132 BP sitting following activity:   Therapy Documentation Precautions:  Precautions Precautions: Fall Precaution Comments: watch BP, dizziness Restrictions Weight Bearing Restrictions: No General:   Vital Signs: Therapy Vitals Temp: 98.2 F (36.8 C) Pulse Rate: 82 Resp: 16 BP: (!) 146/76 Patient Position (if appropriate): Lying Oxygen  Therapy SpO2: 98 % O2 Device: Room Air Pain: Pain Assessment Pain Scale: 0-10 Pain Score: 0-No pain ADL: ADL Eating: Supervision/safety Where Assessed-Eating: Bed level Grooming: Supervision/safety Where Assessed-Grooming: Edge of bed Upper Body Bathing: Supervision/safety Where Assessed-Upper Body Bathing: Edge of bed Lower Body Bathing: Minimal assistance Where Assessed-Lower Body Bathing: Edge of bed Upper Body Dressing: Supervision/safety Where Assessed-Upper Body Dressing: Edge of bed Lower Body Dressing: Minimal assistance Where Assessed-Lower Body Dressing: Edge of bed Toileting: Moderate assistance Where Assessed-Toileting: Glass blower/designer: Psychiatric nurse Method: Arts development officer: Bedside commode, Raised toilet seat, Grab bars Vision   Perception    Praxis   Exercises:   Other Treatments:     Therapy/Group: Individual Therapy  Duayne Cal 12/08/2019, 10:42 AM

## 2019-12-09 ENCOUNTER — Inpatient Hospital Stay (HOSPITAL_COMMUNITY): Payer: Medicare Other | Admitting: Physical Therapy

## 2019-12-09 ENCOUNTER — Inpatient Hospital Stay (HOSPITAL_COMMUNITY): Payer: Medicare Other | Admitting: Occupational Therapy

## 2019-12-09 LAB — CULTURE, BLOOD (ROUTINE X 2)
Culture: NO GROWTH
Culture: NO GROWTH
Special Requests: ADEQUATE
Special Requests: ADEQUATE

## 2019-12-09 LAB — CBC WITH DIFFERENTIAL/PLATELET
Abs Immature Granulocytes: 0.1 10*3/uL — ABNORMAL HIGH (ref 0.00–0.07)
Basophils Absolute: 0.1 10*3/uL (ref 0.0–0.1)
Basophils Relative: 1 %
Eosinophils Absolute: 0.2 10*3/uL (ref 0.0–0.5)
Eosinophils Relative: 4 %
HCT: 35.1 % — ABNORMAL LOW (ref 36.0–46.0)
Hemoglobin: 11.1 g/dL — ABNORMAL LOW (ref 12.0–15.0)
Immature Granulocytes: 2 %
Lymphocytes Relative: 32 %
Lymphs Abs: 2.1 10*3/uL (ref 0.7–4.0)
MCH: 30.9 pg (ref 26.0–34.0)
MCHC: 31.6 g/dL (ref 30.0–36.0)
MCV: 97.8 fL (ref 80.0–100.0)
Monocytes Absolute: 0.6 10*3/uL (ref 0.1–1.0)
Monocytes Relative: 8 %
Neutro Abs: 3.6 10*3/uL (ref 1.7–7.7)
Neutrophils Relative %: 53 %
Platelets: 288 10*3/uL (ref 150–400)
RBC: 3.59 MIL/uL — ABNORMAL LOW (ref 3.87–5.11)
RDW: 14 % (ref 11.5–15.5)
WBC: 6.7 10*3/uL (ref 4.0–10.5)
nRBC: 0 % (ref 0.0–0.2)

## 2019-12-09 LAB — BASIC METABOLIC PANEL
Anion gap: 10 (ref 5–15)
BUN: 6 mg/dL — ABNORMAL LOW (ref 8–23)
CO2: 26 mmol/L (ref 22–32)
Calcium: 8.8 mg/dL — ABNORMAL LOW (ref 8.9–10.3)
Chloride: 104 mmol/L (ref 98–111)
Creatinine, Ser: 0.75 mg/dL (ref 0.44–1.00)
GFR, Estimated: >60 mL/min (ref >60)
Glucose, Bld: 106 mg/dL — ABNORMAL HIGH (ref 70–99)
Potassium: 4.2 mmol/L (ref 3.5–5.1)
Sodium: 140 mmol/L (ref 135–145)

## 2019-12-09 NOTE — Progress Notes (Signed)
Physical Therapy Session Note  Patient Details  Name: [name] MRN: [id number] Date of Birth: 09/28/1931  Today's Date: 12/09/2019 PT Individual Time: 1110-1212 PT Individual Time Calculation (min): 62 min   Short Term Goals: Week 2:  PT Short Term Goal 1 (Week 2): STG=LTG due ELOS  Skilled Therapeutic Interventions/Progress Updates:   Pt received supine in bed with her son present and pt agreeable to therapy session. Supine>sitting R EOB, not using bed features, with supervision. Sitting EOB changed shirt with set-up assist. Pt reports need to use bathroom. Sit<>stands using RW with supervision for safety throughout session - required cuing for proper UE placement during transfer when using RW. Gait ~36ft x2 in/out bathroom using RW with supervision and pt demoing good awareness and sequencing of AD management up/down the incline to bathroom. Standing with supervision for balance performed LB clothing management without assist - attempted but unable to have BM with pt reporting she feels "constipated." Standing hand hygiene at sink with max cuing for proper AD management at counter. In sitting, donned pants with set-up assist and shoes with mod assist for time management. Pt required significantly increased time to complete these ADL tasks with generally slow movements. Transported to/from gym in w/c for time management and energy conservation. Patient participated in [facility] and demonstrates increased fall risk as noted by score of 35/56.  (<36= high risk for falls, close to 100%; 37-45 significant >80%; 46-51 moderate >50%; 52-55 lower >25%). Pt educated on results of test with significant improvement compared to at initial evaluation; however, still at high fall risk. Transported back to room and left seated in w/c with needs in reach, chair alarm on, her son present, and meal tray set-up.  Therapy Documentation Precautions:  Precautions Precautions: Fall Precaution Comments:  watch BP, dizziness Restrictions Weight Bearing Restrictions: No  Pain: Denies pain during session.  Balance: Standardized Balance Assessment Standardized Balance Assessment: Berg Balance Test Berg Balance Test Sit to Stand: Able to stand  independently using hands Standing Unsupported: Able to stand 2 minutes with supervision Sitting with Back Unsupported but Feet Supported on Floor or Stool: Able to sit safely and securely 2 minutes Stand to Sit: Sits safely with minimal use of hands Transfers: Able to transfer safely, minor use of hands Standing Unsupported with Eyes Closed: Able to stand 10 seconds with supervision (no sway this time) Standing Ubsupported with Feet Together: Able to place feet together independently and stand for 1 minute with supervision From Standing, Reach Forward with Outstretched Arm: Can reach forward >12 cm safely (5") From Standing Position, Pick up Object from Floor: Able to pick up shoe, needs supervision From Standing Position, Turn to Look Behind Over each Shoulder: Turn sideways only but maintains balance Turn 360 Degrees: Needs close supervision or verbal cueing Standing Unsupported, Alternately Place Feet on Step/Stool: Needs assistance to keep from falling or unable to try Standing Unsupported, One Foot in Front: Able to take small step independently and hold 30 seconds Standing on One Leg: Unable to try or needs assist to prevent fall Total Score: 35  Therapy/Group: Individual Therapy  Tawana Scale , PT, DPT, CSRS  12/09/2019, 8:05 AM

## 2019-12-09 NOTE — Progress Notes (Signed)
Occupational Therapy Session Note  Patient Details  Name: [name] MRN: [id number] Date of Birth: 10/13/1931  Today's Date: 12/09/2019 OT Individual Time: 8032-1224 OT Individual Time Calculation (min): 60 min    Short Term Goals: Week 2:  OT Short Term Goal 1 (Week 2): STG = LTG 2/2 ELOS  Skilled Therapeutic Interventions/Progress Updates:    Treatment session with focus on self-care retraining, activity tolerance, functional transfers, and energy conservation.  Pt received semi-reclined in bed reporting initially thinking d/c was today but remembered that it is tomorrow.  Pt reports being too cold for shower, but agreeable to washing up at sink.  Pt completed bed mobility supervision and ambulated to sink with RW with supervision.  Pt completed oral care in standing, demonstrating good activity tolerance.  Pt reports need to toilet.  Ambulated to toilet with RW with supervision, completing hygiene and clothing management without assistance.  Pt returned to sink to engage in bathing and dressing.  Pt required intermittent cues for sequencing.  Discussed memory and sequencing of tasks to improve carryover and recall during functional tasks. Pt completed dressing tasks with supervision, requiring frequent rest breaks.  Pt reports "worn out".  Expressed desire to return to bed at end of session instead of sitting up.  Vitals assessed: BP 122/72 and HR 96.  Pt completed stand pivot transfer w/c > bed supervision and left semi-reclined with all needs in reach.  Therapy Documentation Precautions:  Precautions Precautions: Fall Precaution Comments: watch BP, dizziness Restrictions Weight Bearing Restrictions: No General:   Vital Signs: Therapy Vitals Temp: 97.7 F (36.5 C) Temp Source: Oral Pulse Rate: 92 Resp: 18 BP: (!) 129/91 Patient Position (if appropriate): Lying Oxygen Therapy SpO2: 99 % O2 Device: Room Air Pain: Pain Assessment Pain Scale: 0-10 Pain Score: 0-No  pain ADL: ADL Eating: Supervision/safety Where Assessed-Eating: Bed level Grooming: Supervision/safety Where Assessed-Grooming: Edge of bed Upper Body Bathing: Supervision/safety Where Assessed-Upper Body Bathing: Edge of bed Lower Body Bathing: Minimal assistance Where Assessed-Lower Body Bathing: Edge of bed Upper Body Dressing: Supervision/safety Where Assessed-Upper Body Dressing: Edge of bed Lower Body Dressing: Minimal assistance Where Assessed-Lower Body Dressing: Edge of bed Toileting: Moderate assistance Where Assessed-Toileting: Glass blower/designer: Psychiatric nurse Method: Arts development officer: Bedside commode, Raised toilet seat, Grab bars Vision   Perception    Praxis   Exercises:   Other Treatments:     Therapy/Group: Individual Therapy  Simonne Come 12/09/2019, 7:56 AM

## 2019-12-09 NOTE — Plan of Care (Signed)
  Problem: RH BLADDER ELIMINATION Goal: RH STG MANAGE BLADDER WITH ASSISTANCE Description: STG Manage Bladder With mod I Assistance Outcome: Completed/Met   Problem: RH KNOWLEDGE DEFICIT Goal: RH STG INCREASE KNOWLEDGE OF STROKE PROPHYLAXIS Description: Pt will be able to demonstrate understanding of medication regimen, dietary and lifestyle modifications to better control blood pressure and prevent further stroke with mod I assist using handouts and booklet provided. Outcome: Completed/Met

## 2019-12-09 NOTE — Progress Notes (Signed)
PROGRESS NOTE    [name]  YIF:[id number] DOB: 03/22/1931 DOA: 11/23/2019 PCP: Crist Infante, MD  Brief Narrative: [name] is a pleasant 86 yo female with PMH A. fib, sciatica who was initially hospitalized from 11/13/2019 until 11/23/2019 for an acute right PCA stroke requiring TPA and mechanical thrombectomy.  Hospitalization complicated with a postprocedural right common femoral artery pseudoaneurysm with hematoma requiring percutaneous thrombin injection on 11/16/2019 followed by repeat thrombin injection on 11/21/2019. She was then discharged to inpatient rehab. -Just completed a course of Augmentin for pneumonia just prior to this admission  -While in rehab on 11/5 developed dysuria, abnormal urinalysis started Keflex, urine culture grew Klebsiella which is sensitive to cephalosporins, subsequently on 11/9 developed fever of 102 with diarrhea and lethargy, Keflex was changed to Rocephin and TRH was consulted   Assessment & Plan:   Principal Problem:   Acute ischemic right PCA stroke ([place]) s/p tPA and mechanical thrombectomy, d/t AF not on [facility] -Completing therapy at [place], will need neurology follow-up  Klebsiella UTI -Initially treated with Keflex from 11/5, now on ceftriaxone,  -Antibiotics discontinued 11/12  Salmonella gastroenteritis -On 11/9 developed fever and acute diarrhea, GI pathogen panel positive for Salmonella -Treated with IV ceftriaxone x4 days -Clinically improved, antibiotics discontinued on 11/12  DVT prophylaxis: Apixaban Code Status: Full code Family Communication: Daughter at bedside Disposition Plan: home per Rehab     Procedures:   Antimicrobials:    Subjective: -Feels well overall, plan noted for discharge tomorrow  Objective: Vitals:   12/08/19 1346 12/08/19 1401 12/08/19 2035 12/09/19 0511  BP:  121/68 (!) 141/84 (!) 129/91  Pulse:  73 76 92  Resp:  20 16 18   Temp:  98 F (36.7 C) 97.7 F (36.5 C) 97.7 F (36.5  C)  TempSrc:   Oral Oral  SpO2:  97% 100% 99%  Weight: 62.1 kg     Height:        Intake/Output Summary (Last 24 hours) at 12/09/2019 1440 Last data filed at 12/09/2019 1421 Gross per 24 hour  Intake 443 ml  Output --  Net 443 ml   Filed Weights   11/29/19 1347 11/30/19 1000 12/08/19 1346  Weight: 63.5 kg 63.2 kg 62.1 kg    Examination:  General exam: AAOx3, no distress CVS: S1s2/RRR Lungs: Clear bilaterally Abdomen: Soft, nontender, bowel sounds present Extremities: No edema Central nervous system: Alert and oriented.  Moves all extremities, no lateralizing signs     Data Reviewed:   CBC: Recent Labs  Lab 12/05/19 0416 12/06/19 0700 12/07/19 0345 12/08/19 0558 12/09/19 0559  WBC 9.0 7.0 8.0 6.8 6.7  NEUTROABS 6.8 4.4 4.6 4.2 3.6  HGB 11.7* 10.5* 11.1* 10.6* 11.1*  HCT 36.8 32.8* 35.1* 33.1* 35.1*  MCV 100.8* 98.5 99.7 98.2 97.8  PLT 280 231 276 278 867   Basic Metabolic Panel: Recent Labs  Lab 12/05/19 0416 12/06/19 0700 12/07/19 0345 12/08/19 0558 12/09/19 0559  NA 135 137 140 139 140  K 4.0 3.5 3.4* 3.7 4.2  CL 104 103 106 107 104  CO2 22 24 24 23 26   GLUCOSE 117* 114* 112* 107* 106*  BUN 16 13 10 8  6*  CREATININE 0.90 0.77 0.81 0.68 0.75  CALCIUM 8.7* 8.7* 9.0 8.6* 8.8*  MG 1.9 1.9 2.0  --   --    GFR: Estimated Creatinine Clearance: 41.5 mL/min (by C-G formula based on SCr of 0.75 mg/dL). Liver Function Tests: Recent Labs  Lab 12/03/19 1803 12/04/19  0943  AST 29 24  ALT 21 23  ALKPHOS 65 73  BILITOT 1.1 1.3*  PROT 6.2* 6.7  ALBUMIN 3.1* 3.2*   No results for input(s): LIPASE, AMYLASE in the last 168 hours. No results for input(s): AMMONIA in the last 168 hours. Coagulation Profile: No results for input(s): INR, PROTIME in the last 168 hours. Cardiac Enzymes: No results for input(s): CKTOTAL, CKMB, CKMBINDEX, TROPONINI in the last 168 hours. BNP (last 3 results) No results for input(s): PROBNP in the last 8760  hours. HbA1C: No results for input(s): HGBA1C in the last 72 hours. CBG: No results for input(s): GLUCAP in the last 168 hours. Lipid Profile: No results for input(s): CHOL, HDL, LDLCALC, TRIG, CHOLHDL, LDLDIRECT in the last 72 hours. Thyroid Function Tests: No results for input(s): TSH, T4TOTAL, FREET4, T3FREE, THYROIDAB in the last 72 hours. Anemia Panel: No results for input(s): VITAMINB12, FOLATE, FERRITIN, TIBC, IRON, RETICCTPCT in the last 72 hours. Urine analysis:    Component Value Date/Time   COLORURINE AMBER (A) 11/30/2019 0928   APPEARANCEUR CLOUDY (A) 11/30/2019 0928   LABSPEC 1.021 11/30/2019 0928   PHURINE 5.0 11/30/2019 0928   GLUCOSEU NEGATIVE 11/30/2019 0928   HGBUR SMALL (A) 11/30/2019 0928   BILIRUBINUR NEGATIVE 11/30/2019 0928   KETONESUR NEGATIVE 11/30/2019 0928   PROTEINUR 30 (A) 11/30/2019 0928   NITRITE POSITIVE (A) 11/30/2019 0928   LEUKOCYTESUR LARGE (A) 11/30/2019 0928   Sepsis Labs: @LABRCNTIP (procalcitonin:4,lacticidven:4)  ) Recent Results (from the past 240 hour(s))  Culture, Urine     Status: Abnormal   Collection Time: 11/30/19 12:59 PM   Specimen: Urine, Random  Result Value Ref Range Status   Specimen Description URINE, RANDOM  Final   Special Requests   Final    NONE Performed at [facility], [place] [address], [place], [place] [address]    Culture >=100,000 COLONIES/mL KLEBSIELLA PNEUMONIAE (A)  Final   Report Status 12/02/2019 FINAL  Final   Organism ID, Bacteria KLEBSIELLA PNEUMONIAE (A)  Final      Susceptibility   Klebsiella pneumoniae - MIC*    AMPICILLIN >=32 RESISTANT Resistant     CEFAZOLIN <=4 SENSITIVE Sensitive     CEFEPIME <=0.12 SENSITIVE Sensitive     CEFTRIAXONE <=0.25 SENSITIVE Sensitive     CIPROFLOXACIN <=0.25 SENSITIVE Sensitive     GENTAMICIN <=1 SENSITIVE Sensitive     IMIPENEM <=0.25 SENSITIVE Sensitive     NITROFURANTOIN 128 RESISTANT Resistant     TRIMETH/SULFA <=20 SENSITIVE Sensitive      AMPICILLIN/SULBACTAM 4 SENSITIVE Sensitive     PIP/TAZO <=4 SENSITIVE Sensitive     * >=100,000 COLONIES/mL KLEBSIELLA PNEUMONIAE  Gastrointestinal Panel by PCR , Stool     Status: Abnormal   Collection Time: 12/03/19  7:00 PM   Specimen: Rectum; Stool  Result Value Ref Range Status   Campylobacter species NOT DETECTED NOT DETECTED Final   Plesimonas shigelloides NOT DETECTED NOT DETECTED Final   Salmonella species DETECTED (A) NOT DETECTED Final    Comment: RESULT CALLED TO, READ BACK BY AND VERIFIED WITH: JESSICA NIDS 12/04/19 1553 KLW    Yersinia enterocolitica NOT DETECTED NOT DETECTED Final   Vibrio species NOT DETECTED NOT DETECTED Final   Vibrio cholerae NOT DETECTED NOT DETECTED Final   Enteroaggregative E coli (EAEC) NOT DETECTED NOT DETECTED Final   Enteropathogenic E coli (EPEC) NOT DETECTED NOT DETECTED Final   Enterotoxigenic E coli (ETEC) NOT DETECTED NOT DETECTED Final   Shiga like toxin producing E  coli (STEC) NOT DETECTED NOT DETECTED Final   Shigella/Enteroinvasive E coli (EIEC) NOT DETECTED NOT DETECTED Final   Cryptosporidium NOT DETECTED NOT DETECTED Final   Cyclospora cayetanensis NOT DETECTED NOT DETECTED Final   Entamoeba histolytica NOT DETECTED NOT DETECTED Final   Giardia lamblia NOT DETECTED NOT DETECTED Final   Adenovirus F40/41 NOT DETECTED NOT DETECTED Final   Astrovirus NOT DETECTED NOT DETECTED Final   Norovirus GI/GII NOT DETECTED NOT DETECTED Final   Rotavirus A NOT DETECTED NOT DETECTED Final   Sapovirus (I, II, IV, and V) NOT DETECTED NOT DETECTED Final    Comment: Performed at [facility], [address] [place], [place] [address]  Culture, blood (Routine X 2) w Reflex to ID Panel     Status: None   Collection Time: 12/04/19  9:35 AM   Specimen: BLOOD RIGHT HAND  Result Value Ref Range Status   Specimen Description BLOOD RIGHT HAND  Final   Special Requests   Final    BOTTLES DRAWN AEROBIC AND ANAEROBIC Blood Culture adequate  volume   Culture   Final    NO GROWTH 5 DAYS Performed at [facility], [place] [address], [place], [place] [address]    Report Status 12/09/2019 FINAL  Final  Culture, blood (Routine X 2) w Reflex to ID Panel     Status: None   Collection Time: 12/04/19  9:42 AM   Specimen: BLOOD LEFT HAND  Result Value Ref Range Status   Specimen Description BLOOD LEFT HAND  Final   Special Requests   Final    BOTTLES DRAWN AEROBIC AND ANAEROBIC Blood Culture adequate volume   Culture   Final    NO GROWTH 5 DAYS Performed at [facility], [place] [address] [place], [place] [address]    Report Status 12/09/2019 FINAL  Final     Scheduled Meds: . apixaban  5 mg Oral BID  . Gerhardt's butt cream   Topical QID  . sodium chloride flush  3 mL Intravenous Q12H   Continuous Infusions: . sodium chloride Stopped (12/07/19 1650)     LOS: 16 days    Time spent: 78min  Domenic Polite, MD [facility]  12/09/2019, 2:40 PM

## 2019-12-10 MED ORDER — APIXABAN 5 MG PO TABS: 5.0000 mg | ORAL_TABLET | Freq: Two times a day (BID) | ORAL | 0 refills | Status: DC

## 2019-12-10 NOTE — Progress Notes (Signed)
Slept ok. 2 large incontinent voids during night. Wearing peri pad to help manage incontinence. Small, formed, continent BM this AM. [name]

## 2019-12-10 NOTE — Progress Notes (Addendum)
[facility] PHYSICAL MEDICINE & REHABILITATION PROGRESS NOTE   Subjective/Complaints:  No issues overnite no further diarrhea, no abd pain   ROS:  Pt denies SOB, abd pain, CP, N/V/C/D, and vision changes  Objective:   No results found. Recent Labs    12/08/19 0558 12/09/19 0559  WBC 6.8 6.7  HGB 10.6* 11.1*  HCT 33.1* 35.1*  PLT 278 288   Recent Labs    12/08/19 0558 12/09/19 0559  NA 139 140  K 3.7 4.2  CL 107 104  CO2 23 26  GLUCOSE 107* 106*  BUN 8 6*  CREATININE 0.68 0.75  CALCIUM 8.6* 8.8*    Intake/Output Summary (Last 24 hours) at 12/10/2019 0844 Last data filed at 12/10/2019 0831 Gross per 24 hour  Intake 700 ml  Output --  Net 700 ml        Physical Exam: Vital Signs Blood pressure (!) 142/82, pulse 95, temperature 98.3 F (36.8 C), resp. rate 18, height 5' (1.524 m), weight 62.1 kg, SpO2 96 %.  General: No acute distress Mood and affect are appropriate Heart: Regular rate and rhythm no rubs murmurs or extra sounds Lungs: Clear to auscultation, breathing unlabored, no rales or wheezes Abdomen: Positive bowel sounds, soft nontender to palpation, nondistended Extremities: No clubbing, cyanosis, or edema    Skin: No evidence of breakdown, no evidence of rash Neurologic: Cranial nerves II through XII intact, motor strength is 4/5 in bilateral deltoid, bicep, tricep, grip, hip flexor, knee extensors, ankle dorsiflexor and plantar flexor Intact FNF and Heel shin BUE and BLE,  Mild nystagmus , horz bilaterally, EOMI   Musculoskeletal:No pain with AROM no knee or ankle eff     Assessment/Plan:  1. Functional deficits secondary to right cerebellar, frontal and thalamic infarcts with SAH  Stable for D/C today F/u PCP in 3-4 weeks F/u PM&R 2 weeks See D/C summary  See D/C instructions    Care Tool:  Bathing    Body parts bathed by patient: Right arm, Left arm, Chest, Abdomen, Front perineal area, Buttocks, Right upper leg, Left upper  leg, Right lower leg, Left lower leg, Face         Bathing assist Assist Level: Supervision/Verbal cueing     Upper Body Dressing/Undressing Upper body dressing   What is the patient wearing?: Pull over shirt, Bra    Upper body assist Assist Level: Supervision/Verbal cueing    Lower Body Dressing/Undressing Lower body dressing      What is the patient wearing?: Pants, Underwear/pull up     Lower body assist Assist for lower body dressing: Supervision/Verbal cueing     Toileting Toileting    Toileting assist Assist for toileting: Supervision/Verbal cueing     Transfers Chair/bed transfer  Transfers assist     Chair/bed transfer assist level: Supervision/Verbal cueing Chair/bed transfer assistive device: Programmer, multimedia   Ambulation assist      Assist level: Supervision/Verbal cueing Assistive device: Walker-rolling Max distance: 150   Walk 10 feet activity   Assist     Assist level: Supervision/Verbal cueing Assistive device: Walker-rolling   Walk 50 feet activity   Assist    Assist level: Supervision/Verbal cueing Assistive device: Walker-rolling    Walk 150 feet activity   Assist Walk 150 feet activity did not occur: Safety/medical concerns  Assist level: Supervision/Verbal cueing Assistive device: Walker-rolling    Walk 10 feet on uneven surface  activity   Assist     Assist level: Supervision/Verbal cueing Assistive  device: Aeronautical engineer Will patient use wheelchair at discharge?: No             Wheelchair 50 feet with 2 turns activity    Assist            Wheelchair 150 feet activity     Assist          Blood pressure (!) 142/82, pulse 95, temperature 98.3 F (36.8 C), resp. rate 18, height 5' (1.524 m), weight 62.1 kg, SpO2 96 %.  Medical Problem List and Plan: 1.  Deficits with mobility, endurance, self-care secondary to acute infarct in right  cerebellum, right frontal cortex, right thalamus with mild SAH.       appreciate IM note             -ELOS/Goals: 11/11             -Contine CIR therapies including PT, OT, and SLP  2.  Antithrombotics: -DVT/anticoagulation:  Pharmaceutical: Other (comment)--Eliquis             -antiplatelet therapy: N/A 3. Right groin pain/Pain Management: Tylenol prn secondary to hematoma. Well controlled.  4. Mood: LCSW to follow for evaluation and support.              -antipsychotic agents: N/A 5. Neuropsych: This patient is capable of making decisions on her own behalf. 6. Skin/Wound Care: local care to right groin. Routine pressure relief measures.  7. Fluids/Electrolytes/Nutrition: Encourage intake.  CMP ordered.I: 755ml- pt states she is drinking well, Cr 0.77 on 11/11 HypoK improved with supplementation 3.5 on 11/11, 3.4 on 3/12- will supplement additional 18meq  11/13- K+ up to 3.7- con't regimen 8. EHM:CNOBSJG BP tid    Vitals:   12/10/19 0630 12/10/19 0632  BP: (!) 144/86 (!) 142/82  Pulse: 89 95  Resp:    Temp:    SpO2:    11/15 controlled  9. A fib: HR controlled off metoprolol.  Eliquis resumed 10/28.              Heart rates increasing off metoprolol 10. Right groin pseudoaneurysm: Still has hematoma which is improving- recheck CBC 11. Acute blood loss anemia:              CBC ordered. 12.  Chronic UTIs/urinary frequency: UA positive- keflex started. UC showing >100K Kleb P- will switch to ceftriaxone  -PVR scan yesterday was 76cc  -purewick catheter for HS frequency very helpful  11/13- off IV ABX 13. Slow transit constipation: managed with colace and miralax.              Adjust all meds as necessary   -Having regular BM 14.  No clinical evidence of PNA PNA some chronic interstitial changes on xray , no leukocytosis, afebrile without cough 15. Fever - +salmonella gastroenteritis , resolved stable for d/c   LOS: 17 days A FACE TO [place] E  Ariv Penrod 12/10/2019, 8:44 AM

## 2019-12-10 NOTE — Progress Notes (Signed)
Inpatient Rehabilitation Care Coordinator ° Discharge Note ° °The overall goal for the admission was met for:  ° °Discharge location: Yes, Home ° °Length of Stay: Yes, 17 Days ° °Discharge activity level: Yes, Supervision ° °Home/community participation: Yes ° °Services provided included: MD, RD, PT, OT, SLP, RN, CM, TR, Pharmacy, Neuropsych and SW ° °Financial Services: Medicare ° °Follow-up services arranged: Home Health: [facility] ° °Comments (or additional information): ° °Patient/Family verbalized understanding of follow-up arrangements: Yes ° °Individual responsible for coordination of the follow-up plan: Stephanie (Daughter): [phone] ° °Confirmed correct DME delivered:  J  12/10/2019   ° ° J  °

## 2019-12-11 ENCOUNTER — Telehealth: Payer: Self-pay | Admitting: *Deleted

## 2019-12-11 DIAGNOSIS — Z9181 History of falling: Secondary | ICD-10-CM | POA: Diagnosis not present

## 2019-12-11 DIAGNOSIS — E785 Hyperlipidemia, unspecified: Secondary | ICD-10-CM | POA: Diagnosis not present

## 2019-12-11 DIAGNOSIS — Z87891 Personal history of nicotine dependence: Secondary | ICD-10-CM | POA: Diagnosis not present

## 2019-12-11 DIAGNOSIS — I69312 Visuospatial deficit and spatial neglect following cerebral infarction: Secondary | ICD-10-CM | POA: Diagnosis not present

## 2019-12-11 DIAGNOSIS — I480 Paroxysmal atrial fibrillation: Secondary | ICD-10-CM | POA: Diagnosis not present

## 2019-12-11 DIAGNOSIS — Z8701 Personal history of pneumonia (recurrent): Secondary | ICD-10-CM | POA: Diagnosis not present

## 2019-12-11 DIAGNOSIS — Z87440 Personal history of urinary (tract) infections: Secondary | ICD-10-CM | POA: Diagnosis not present

## 2019-12-11 DIAGNOSIS — I10 Essential (primary) hypertension: Secondary | ICD-10-CM | POA: Diagnosis not present

## 2019-12-11 DIAGNOSIS — M5430 Sciatica, unspecified side: Secondary | ICD-10-CM | POA: Diagnosis not present

## 2019-12-11 DIAGNOSIS — I700 Atherosclerosis of aorta: Secondary | ICD-10-CM | POA: Diagnosis not present

## 2019-12-11 DIAGNOSIS — I69318 Other symptoms and signs involving cognitive functions following cerebral infarction: Secondary | ICD-10-CM | POA: Diagnosis not present

## 2019-12-11 DIAGNOSIS — R441 Visual hallucinations: Secondary | ICD-10-CM | POA: Diagnosis not present

## 2019-12-11 DIAGNOSIS — G4700 Insomnia, unspecified: Secondary | ICD-10-CM | POA: Diagnosis not present

## 2019-12-11 DIAGNOSIS — Z7901 Long term (current) use of anticoagulants: Secondary | ICD-10-CM | POA: Diagnosis not present

## 2019-12-11 DIAGNOSIS — I69398 Other sequelae of cerebral infarction: Secondary | ICD-10-CM | POA: Diagnosis not present

## 2019-12-11 DIAGNOSIS — D62 Acute posthemorrhagic anemia: Secondary | ICD-10-CM | POA: Diagnosis not present

## 2019-12-11 DIAGNOSIS — K5901 Slow transit constipation: Secondary | ICD-10-CM | POA: Diagnosis not present

## 2019-12-11 DIAGNOSIS — J439 Emphysema, unspecified: Secondary | ICD-10-CM | POA: Diagnosis not present

## 2019-12-11 DIAGNOSIS — Z86718 Personal history of other venous thrombosis and embolism: Secondary | ICD-10-CM | POA: Diagnosis not present

## 2019-12-11 DIAGNOSIS — I4891 Unspecified atrial fibrillation: Secondary | ICD-10-CM | POA: Diagnosis not present

## 2019-12-11 DIAGNOSIS — I69354 Hemiplegia and hemiparesis following cerebral infarction affecting left non-dominant side: Secondary | ICD-10-CM | POA: Diagnosis not present

## 2019-12-11 DIAGNOSIS — H9190 Unspecified hearing loss, unspecified ear: Secondary | ICD-10-CM | POA: Diagnosis not present

## 2019-12-11 NOTE — Telephone Encounter (Signed)
[name] [facility] requesting POC 2wk1,1wk1,2wk3,1wk4 and [facility] 1wk2, 2wk3  Approval given.Marland Kitchen

## 2019-12-12 ENCOUNTER — Telehealth: Payer: Self-pay

## 2019-12-12 NOTE — Telephone Encounter (Signed)
Transitional Care call--Daughter [name]    1. Are you/is patient experiencing any problems since coming home? No Are there any questions regarding any aspect of care? No 2. Are there any questions regarding medications administration/dosing?  No Are meds being taken as prescribed? Yes Patient should review meds with caller to confirm 3. Have there been any falls? No 4. Has Home Health been to the house and/or have they contacted you? Yes If not, have you tried to contact them? Can we help you contact them? 5. Are bowels and bladder emptying properly? Yes Are there any unexpected incontinence issues? No If applicable, is patient following bowel/bladder programs? 6. Any fevers, problems with breathing, unexpected pain? No 7. Are there any skin problems or new areas of breakdown? No 8. Has the patient/family member arranged specialty MD follow up (ie cardiology/neurology/renal/surgical/etc)?  No but will look at discharge summary and call to make appointment Can we help arrange? 9. Does the patient need any other services or support that we can help arrange? No 10. Are caregivers following through as expected in assisting the patient? Yes 11. Has the patient quit smoking, drinking alcohol, or using drugs as recommended? Yes  Appointment time 9:40 am, arrive time 9:20 am on 12/19/2019 with Zella Ball then Dr. Letta Pate [address] [phone]

## 2019-12-13 DIAGNOSIS — I69318 Other symptoms and signs involving cognitive functions following cerebral infarction: Secondary | ICD-10-CM | POA: Diagnosis not present

## 2019-12-13 DIAGNOSIS — R441 Visual hallucinations: Secondary | ICD-10-CM | POA: Diagnosis not present

## 2019-12-13 DIAGNOSIS — I69398 Other sequelae of cerebral infarction: Secondary | ICD-10-CM | POA: Diagnosis not present

## 2019-12-13 DIAGNOSIS — I10 Essential (primary) hypertension: Secondary | ICD-10-CM | POA: Diagnosis not present

## 2019-12-13 DIAGNOSIS — I69312 Visuospatial deficit and spatial neglect following cerebral infarction: Secondary | ICD-10-CM | POA: Diagnosis not present

## 2019-12-13 DIAGNOSIS — I69354 Hemiplegia and hemiparesis following cerebral infarction affecting left non-dominant side: Secondary | ICD-10-CM | POA: Diagnosis not present

## 2019-12-14 DIAGNOSIS — I69398 Other sequelae of cerebral infarction: Secondary | ICD-10-CM | POA: Diagnosis not present

## 2019-12-14 DIAGNOSIS — I69318 Other symptoms and signs involving cognitive functions following cerebral infarction: Secondary | ICD-10-CM | POA: Diagnosis not present

## 2019-12-14 DIAGNOSIS — I69312 Visuospatial deficit and spatial neglect following cerebral infarction: Secondary | ICD-10-CM | POA: Diagnosis not present

## 2019-12-14 DIAGNOSIS — I10 Essential (primary) hypertension: Secondary | ICD-10-CM | POA: Diagnosis not present

## 2019-12-14 DIAGNOSIS — R441 Visual hallucinations: Secondary | ICD-10-CM | POA: Diagnosis not present

## 2019-12-14 DIAGNOSIS — I69354 Hemiplegia and hemiparesis following cerebral infarction affecting left non-dominant side: Secondary | ICD-10-CM | POA: Diagnosis not present

## 2019-12-16 DIAGNOSIS — I69398 Other sequelae of cerebral infarction: Secondary | ICD-10-CM | POA: Diagnosis not present

## 2019-12-16 DIAGNOSIS — I10 Essential (primary) hypertension: Secondary | ICD-10-CM | POA: Diagnosis not present

## 2019-12-16 DIAGNOSIS — I69318 Other symptoms and signs involving cognitive functions following cerebral infarction: Secondary | ICD-10-CM | POA: Diagnosis not present

## 2019-12-16 DIAGNOSIS — R441 Visual hallucinations: Secondary | ICD-10-CM | POA: Diagnosis not present

## 2019-12-16 DIAGNOSIS — I69312 Visuospatial deficit and spatial neglect following cerebral infarction: Secondary | ICD-10-CM | POA: Diagnosis not present

## 2019-12-16 DIAGNOSIS — I69354 Hemiplegia and hemiparesis following cerebral infarction affecting left non-dominant side: Secondary | ICD-10-CM | POA: Diagnosis not present

## 2019-12-17 ENCOUNTER — Other Ambulatory Visit: Payer: Self-pay

## 2019-12-17 ENCOUNTER — Encounter: Payer: Medicare Other | Attending: Registered Nurse | Admitting: Registered Nurse

## 2019-12-17 ENCOUNTER — Telehealth: Payer: Self-pay

## 2019-12-17 ENCOUNTER — Encounter: Payer: Self-pay | Admitting: Registered Nurse

## 2019-12-17 VITALS — BP 119/78 | HR 95 | Temp 98.0°F | Ht 60.0 in | Wt 137.6 lb

## 2019-12-17 DIAGNOSIS — I724 Aneurysm of artery of lower extremity: Secondary | ICD-10-CM | POA: Diagnosis not present

## 2019-12-17 DIAGNOSIS — I63531 Cerebral infarction due to unspecified occlusion or stenosis of right posterior cerebral artery: Secondary | ICD-10-CM

## 2019-12-17 DIAGNOSIS — T81718A Complication of other artery following a procedure, not elsewhere classified, initial encounter: Secondary | ICD-10-CM

## 2019-12-17 DIAGNOSIS — I639 Cerebral infarction, unspecified: Secondary | ICD-10-CM | POA: Diagnosis not present

## 2019-12-17 NOTE — Progress Notes (Signed)
Subjective:    Patient ID: [name], female    DOB: 11-27-1931, 86 y.o.   MRN: [id number]  HPI: [name] is a 86 y.o. female who is here for Transitional Care visit for follow up of her Acute Ischemic Stroke and Pseudoaneurysm of femoral artery following procedure. [name] presented to [facility] on 11/13/2019 with acute onset of right gaze preference and left sided weakness, she was brought to hospital via EMS. Neurology was consulted. [name] received tPa and underwent mechanical thrombectomy.  CT Head Code Stroke:  IMPRESSION: 1. Age indeterminate small vessel disease including in the right thalamus, but no acute cortically based infarct or acute intracranial hemorrhage identified CTA:  IMPRESSION: 1. Positive for occlusion of the Right PCA origin.  2. No other large vessel occlusion, and generally mild for age atherosclerosis in the head and neck.  3. Emphysema (ICD10-J43.9) with layering left pleural effusion, patchy left upper lobe opacity compatible with pneumonia in this setting.  4. Aortic Atherosclerosis (ICD10-I70.0) MR Brain: WO Contrast:  IMPRESSION: Acute infarct right thalamus with associated mild hemorrhage as noted on recent CT. There is also mild subarachnoid hemorrhage in the interpeduncular cistern unchanged from CT. Additional small areas of acute infarct in the right cerebellum and right frontal cortex.  Moderate atrophy. Chronic microvascular ischemic change in the white matter.  Right PCA patent post thrombectomy. There is a mild stenosis in the right PCA at the P1/ P2 junction.  [name] developed a right groin hematoma due to femoral pseudoaneurysm,she underwent thrombin injection on 11/16/2019 by Dr Earleen Newport.  [name] was admitted to inpatient rehabilitation on 11/23/2019 and discharged home on 12/10/2019. She is receiving Home Health Therapy with [facility]. She reports she has a good appetite.  [name]  reports she has burning with urination, she refuses a urinary culture, she states she will F/U with her PCP .  [name] denies any pain. She rates her pain 0.   [name] daughter in room.   Pain Inventory Average Pain 0 Pain Right Now 0 My pain is sharp  LOCATION OF PAIN   BOWEL Number of stools per week: 7 Oral laxative use No  Type of laxative None Enema or suppository use No  History of colostomy No  Incontinent No   BLADDER Normal and Pads In and out cath, frequency N/A Able to self cath N/A Bladder incontinence No  Frequent urination Yes  Leakage with coughing No  Difficulty starting stream Yes  Incomplete bladder emptying No    Mobility how many minutes can you walk? 5 MINS ability to climb steps?  yes do you drive?  no Do you have any goals in this area?  yes  Function retired I need assistance with the following:  bathing, household duties and shopping Do you have any goals in this area?  yes  Neuro/Psych bladder control problems weakness trouble walking dizziness  Prior Studies New Patient  Physicians involved in your care New Patient   Family History  Problem Relation Age of Onset  . Cancer Mother   . Cancer Father        pancreatic cancer  . Heart disease Brother    Social History   Socioeconomic History  . Marital status: Married    Spouse name: Not on file  . Number of children: Not on file  . Years of education: Not on file  . Highest education level: Not on file  Occupational History  .  Not on file  Tobacco Use  . Smoking status: Former Research scientist (life sciences)  . Smokeless tobacco: Never Used  Vaping Use  . Vaping Use: Never used  Substance and Sexual Activity  . Alcohol use: Yes  . Drug use: Never  . Sexual activity: Not on file  Other Topics Concern  . Not on file  Social History Narrative  . Not on file   Social Determinants of Health   Financial Resource Strain:   . Difficulty of Paying Living Expenses: Not on file  Food  Insecurity:   . Worried About Charity fundraiser in the Last Year: Not on file  . Ran Out of Food in the Last Year: Not on file  Transportation Needs:   . Lack of Transportation (Medical): Not on file  . Lack of Transportation (Non-Medical): Not on file  Physical Activity:   . Days of Exercise per Week: Not on file  . Minutes of Exercise per Session: Not on file  Stress:   . Feeling of Stress : Not on file  Social Connections:   . Frequency of Communication with Friends and Family: Not on file  . Frequency of Social Gatherings with Friends and Family: Not on file  . Attends Religious Services: Not on file  . Active Member of Clubs or Organizations: Not on file  . Attends Archivist Meetings: Not on file  . Marital Status: Not on file   Past Surgical History:  Procedure Laterality Date  . IR CT HEAD LTD  11/14/2019  . IR PERCUTANEOUS ART THROMBECTOMY/INFUSION INTRACRANIAL INC DIAG ANGIO  11/14/2019  . IR US GUIDE VASC ACCESS RIGHT  11/14/2019  . no surgical hx    . RADIOLOGY WITH ANESTHESIA N/A 11/13/2019   Procedure: IR WITH ANESTHESIA;  Surgeon: Luanne Bras, MD;  Location: [place];  Service: Radiology;  Laterality: N/A;   Past Medical History:  Diagnosis Date  . Atrial fibrillation ([place])    Prior treatment with amiodarone, held sinus rhythm and drug stopped  . Atrial flutter ([place])    Status post ablation prior to  2009  . Chest pain    Nuclear, 2004, no scar or ischemia, EF 77%  . Ejection fraction    EF 70%, nuclear,  2004  //     . Fall    July, 2014  . H/O bladder repair surgery   . Right Achilles tendinitis   . Sciatica    BP 119/78   Pulse 95   Temp 98 F (36.7 C)   Ht 5' (1.524 m)   Wt 137 lb 9.6 oz (62.4 kg)   LMP  (LMP Unknown)   SpO2 97%   BMI 26.87 kg/m   Opioid Risk Score:   Fall Risk Score:  `1  Depression screen PHQ 2/9  Depression screen PHQ 2/9 12/17/2019  Decreased Interest 0  Down, Depressed, Hopeless 0  PHQ - 2 Score 0   Altered sleeping 0  Tired, decreased energy 1  Change in appetite 0  Feeling bad or failure about yourself  0  Trouble concentrating 1  Moving slowly or fidgety/restless 0  Suicidal thoughts 0  PHQ-9 Score 2   Review of Systems  Genitourinary: Positive for dysuria. Negative for urgency.  Musculoskeletal:       Balance  Neurological: Positive for dizziness and weakness. Negative for tremors.  All other systems reviewed and are negative.      Objective:   Physical Exam Vitals and nursing note reviewed.  Constitutional:  Appearance: Normal appearance.  Cardiovascular:     Rate and Rhythm: Normal rate and regular rhythm.     Pulses: Normal pulses.     Heart sounds: Normal heart sounds.  Pulmonary:     Effort: Pulmonary effort is normal.     Breath sounds: Normal breath sounds.  Musculoskeletal:     Cervical back: Normal range of motion and neck supple.     Comments: Normal Muscle Bulk and Muscle Testing Reveals:  Upper Extremities: Full ROM and Muscle Strength 5/5  Lower Extremities: Full ROM and Muscle Strength 5/5 Arises from Table slowly using walker for support Narrow Based  Gait   Skin:    General: Skin is warm and dry.  Neurological:     Mental Status: She is alert and oriented to person, place, and time.  Psychiatric:        Mood and Affect: Mood normal.        Behavior: Behavior normal.           Assessment & Plan:  1.Acute Ischemic Stroke: [place] with [place].  2.Pseudoaneurysm of femoral artery following procedure.S/P Thrombin Injection.  Continue to Monitor.    F/U in 4- 6 weeks with Dr Letta Pate.

## 2019-12-17 NOTE — Telephone Encounter (Signed)
Sarah from [facility] called request for HHST 1wk4, 1eow5.Orders approved and given.

## 2019-12-18 DIAGNOSIS — A029 Salmonella infection, unspecified: Secondary | ICD-10-CM | POA: Diagnosis not present

## 2019-12-18 DIAGNOSIS — I10 Essential (primary) hypertension: Secondary | ICD-10-CM | POA: Diagnosis not present

## 2019-12-18 DIAGNOSIS — I69398 Other sequelae of cerebral infarction: Secondary | ICD-10-CM | POA: Diagnosis not present

## 2019-12-18 DIAGNOSIS — I69318 Other symptoms and signs involving cognitive functions following cerebral infarction: Secondary | ICD-10-CM | POA: Diagnosis not present

## 2019-12-18 DIAGNOSIS — R7301 Impaired fasting glucose: Secondary | ICD-10-CM | POA: Diagnosis not present

## 2019-12-18 DIAGNOSIS — R441 Visual hallucinations: Secondary | ICD-10-CM | POA: Diagnosis not present

## 2019-12-18 DIAGNOSIS — E785 Hyperlipidemia, unspecified: Secondary | ICD-10-CM | POA: Diagnosis not present

## 2019-12-18 DIAGNOSIS — N1831 Chronic kidney disease, stage 3a: Secondary | ICD-10-CM | POA: Diagnosis not present

## 2019-12-18 DIAGNOSIS — E871 Hypo-osmolality and hyponatremia: Secondary | ICD-10-CM | POA: Diagnosis not present

## 2019-12-18 DIAGNOSIS — I69312 Visuospatial deficit and spatial neglect following cerebral infarction: Secondary | ICD-10-CM | POA: Diagnosis not present

## 2019-12-18 DIAGNOSIS — I480 Paroxysmal atrial fibrillation: Secondary | ICD-10-CM | POA: Diagnosis not present

## 2019-12-18 DIAGNOSIS — N390 Urinary tract infection, site not specified: Secondary | ICD-10-CM | POA: Diagnosis not present

## 2019-12-18 DIAGNOSIS — D6869 Other thrombophilia: Secondary | ICD-10-CM | POA: Diagnosis not present

## 2019-12-18 DIAGNOSIS — I729 Aneurysm of unspecified site: Secondary | ICD-10-CM | POA: Diagnosis not present

## 2019-12-18 DIAGNOSIS — I69354 Hemiplegia and hemiparesis following cerebral infarction affecting left non-dominant side: Secondary | ICD-10-CM | POA: Diagnosis not present

## 2019-12-18 DIAGNOSIS — D692 Other nonthrombocytopenic purpura: Secondary | ICD-10-CM | POA: Diagnosis not present

## 2019-12-19 ENCOUNTER — Encounter: Payer: Medicare Other | Admitting: Registered Nurse

## 2019-12-19 ENCOUNTER — Emergency Department (HOSPITAL_COMMUNITY): Payer: Medicare Other

## 2019-12-19 ENCOUNTER — Emergency Department (HOSPITAL_COMMUNITY)
Admission: EM | Admit: 2019-12-19 | Discharge: 2019-12-20 | Disposition: A | Discharge status: Home / Self Care | Payer: Medicare Other | Attending: Emergency Medicine | Admitting: Emergency Medicine

## 2019-12-19 DIAGNOSIS — Z87891 Personal history of nicotine dependence: Secondary | ICD-10-CM | POA: Insufficient documentation

## 2019-12-19 DIAGNOSIS — J90 Pleural effusion, not elsewhere classified: Secondary | ICD-10-CM | POA: Diagnosis not present

## 2019-12-19 DIAGNOSIS — J439 Emphysema, unspecified: Secondary | ICD-10-CM | POA: Diagnosis not present

## 2019-12-19 DIAGNOSIS — I10 Essential (primary) hypertension: Secondary | ICD-10-CM | POA: Diagnosis not present

## 2019-12-19 DIAGNOSIS — R531 Weakness: Secondary | ICD-10-CM

## 2019-12-19 DIAGNOSIS — R112 Nausea with vomiting, unspecified: Secondary | ICD-10-CM

## 2019-12-19 DIAGNOSIS — Z20822 Contact with and (suspected) exposure to covid-19: Secondary | ICD-10-CM | POA: Diagnosis not present

## 2019-12-19 DIAGNOSIS — N390 Urinary tract infection, site not specified: Secondary | ICD-10-CM | POA: Diagnosis not present

## 2019-12-19 DIAGNOSIS — R10819 Abdominal tenderness, unspecified site: Secondary | ICD-10-CM | POA: Diagnosis not present

## 2019-12-19 DIAGNOSIS — I4891 Unspecified atrial fibrillation: Secondary | ICD-10-CM | POA: Diagnosis not present

## 2019-12-19 DIAGNOSIS — I69354 Hemiplegia and hemiparesis following cerebral infarction affecting left non-dominant side: Secondary | ICD-10-CM | POA: Diagnosis not present

## 2019-12-19 DIAGNOSIS — I69318 Other symptoms and signs involving cognitive functions following cerebral infarction: Secondary | ICD-10-CM | POA: Diagnosis not present

## 2019-12-19 DIAGNOSIS — I69398 Other sequelae of cerebral infarction: Secondary | ICD-10-CM | POA: Diagnosis not present

## 2019-12-19 DIAGNOSIS — R1110 Vomiting, unspecified: Secondary | ICD-10-CM | POA: Diagnosis not present

## 2019-12-19 DIAGNOSIS — I6782 Cerebral ischemia: Secondary | ICD-10-CM | POA: Diagnosis not present

## 2019-12-19 DIAGNOSIS — I69312 Visuospatial deficit and spatial neglect following cerebral infarction: Secondary | ICD-10-CM | POA: Diagnosis not present

## 2019-12-19 DIAGNOSIS — R441 Visual hallucinations: Secondary | ICD-10-CM | POA: Diagnosis not present

## 2019-12-19 DIAGNOSIS — R4182 Altered mental status, unspecified: Secondary | ICD-10-CM | POA: Diagnosis not present

## 2019-12-19 DIAGNOSIS — R251 Tremor, unspecified: Secondary | ICD-10-CM | POA: Diagnosis not present

## 2019-12-19 DIAGNOSIS — I491 Atrial premature depolarization: Secondary | ICD-10-CM | POA: Diagnosis not present

## 2019-12-19 DIAGNOSIS — R1111 Vomiting without nausea: Secondary | ICD-10-CM | POA: Diagnosis not present

## 2019-12-19 DIAGNOSIS — R000 Tachycardia, unspecified: Secondary | ICD-10-CM | POA: Diagnosis not present

## 2019-12-19 MED ORDER — ONDANSETRON HCL 4 MG/2ML IJ SOLN
4.0000 mg | Freq: Once | INTRAMUSCULAR | Status: AC
  Administered 2019-12-20: 4 mg via INTRAVENOUS
  Filled 2019-12-19: qty 2

## 2019-12-19 MED ORDER — SODIUM CHLORIDE 0.9 % IV BOLUS
500.0000 mL | Freq: Once | INTRAVENOUS | Status: AC
  Administered 2019-12-20: 500 mL via INTRAVENOUS

## 2019-12-19 NOTE — ED Provider Notes (Signed)
Emergency Department Provider Note   I have reviewed the triage vital signs and the nursing notes.   HISTORY  Chief Complaint No chief complaint on file.   HPI [name] is a 86 y.o. female with PMH reviewed below presents to the ED for evaluation of acute onset generalized weakness and vomiting. She had a recent admission for CVA and PNA. She underwent cerebral revascularization and developed mild SAH. She also developed a pseudoaneurysm in the right groin which resolved with US-guided thrombin injection. Patient also developed UTI during this hospitalization. She is currently living at home with her daughter.   Patient tells me that this evening she developed acute onset generalized weakness with nausea and vomiting.  She denies abdominal pain, chest pain, shortness of breath, diaphoresis.  She felt very weak and lightheaded but did not pass out.  Her weakness was generalized and not unilateral.  No speech or vision disturbance reported.  EMS was called and transported to the emergency department.  She states that she is overall feeling better but remains slightly nauseated.  No radiation of symptoms or other modifying factors.   Additional history provided by patient's daughter at bedside. She confirms above history. Patient also with rigor-type shaking at home during the event. No seizure like activity. Patient was started on a new abx today with report of UTI diagnosed as an outpatient.    Past Medical History:  Diagnosis Date  . Atrial fibrillation ([place])    Prior treatment with amiodarone, held sinus rhythm and drug stopped  . Atrial flutter ([place])    Status post ablation prior to  2009  . Chest pain    Nuclear, 2004, no scar or ischemia, EF 77%  . Ejection fraction    EF 70%, nuclear,  2004  //     . Fall    July, 2014  . H/O bladder repair surgery   . Right Achilles tendinitis   . Sciatica     Patient Active Problem List   Diagnosis Date Noted  . Pseudoaneurysm  of femoral artery following procedure ([place]) 11/23/2019  . CAP (community acquired pneumonia) 11/23/2019  . Essential hypertension 11/23/2019  . Hyperlipidemia LDL goal <70 11/23/2019  . Emphysema of lung ([place]) 11/23/2019  . Aortic atherosclerosis ([place]) 11/23/2019  . Acute blood loss anemia 11/23/2019  . Acute ischemic stroke ([place])   . Pseudoaneurysm ([place])   . Slow transit constipation   . Chronic UTI   . Acute ischemic right PCA stroke ([place]) s/p tPA and mechanical thrombectomy, d/t AF not on [facility] 11/14/2019  . Stroke ([place]) 11/13/2019  . Hymenoptera allergy 10/05/2014  . SVT (supraventricular tachycardia) ([place]) 10/20/2012  . Fall   . Chest pain   . Ejection fraction   . Atrial fibrillation ([place])   . Atrial flutter [facility])     Past Surgical History:  Procedure Laterality Date  . IR CT HEAD LTD  11/14/2019  . IR PERCUTANEOUS ART THROMBECTOMY/INFUSION INTRACRANIAL INC DIAG ANGIO  11/14/2019  . IR US GUIDE VASC ACCESS RIGHT  11/14/2019  . no surgical hx    . RADIOLOGY WITH ANESTHESIA N/A 11/13/2019   Procedure: IR WITH ANESTHESIA;  Surgeon: Luanne Bras, MD;  Location: [facility];  Service: Radiology;  Laterality: N/A;    Allergies Bee venom, Mixed vespid venom, Codeine, Sulfa antibiotics, and Erythromycin  Family History  Problem Relation Age of Onset  . Cancer Mother   . Cancer Father        pancreatic cancer  .  Heart disease Brother     Social History Social History   Tobacco Use  . Smoking status: Former Research scientist (life sciences)  . Smokeless tobacco: Never Used  Vaping Use  . Vaping Use: Never used  Substance Use Topics  . Alcohol use: Yes  . Drug use: Never    Review of Systems  Constitutional: No fever/chills. Positive generalized weakness.  Eyes: No visual changes. ENT: No sore throat. Cardiovascular: Denies chest pain. Respiratory: Denies shortness of breath. Gastrointestinal: No abdominal pain. Positive nausea and vomiting.  No diarrhea.  No constipation. Genitourinary:  Negative for dysuria. Musculoskeletal: Negative for back pain. Skin: Negative for rash. Neurological: Negative for headaches, focal weakness or numbness.  10-point ROS otherwise negative.  ____________________________________________   PHYSICAL EXAM:  VITAL SIGNS: ED Triage Vitals [12/19/19 2325]  Enc Vitals Group     BP (!) 185/106     Pulse Rate 84     Resp (!) 22     Temp 98.1 F (36.7 C)     Temp Source Oral     SpO2 93 %   Constitutional: Alert and oriented. Well appearing and in no acute distress. Eyes: Conjunctivae are normal. PERRL.  Head: Atraumatic. Nose: No congestion/rhinnorhea. Mouth/Throat: Mucous membranes are moist.  Neck: No stridor.   Cardiovascular: Normal rate, regular rhythm. Good peripheral circulation. Grossly normal heart sounds.   Respiratory: Normal respiratory effort.  No retractions. Lungs CTAB. Gastrointestinal: Soft with mild diffuse tenderness. No rebound or guarding. No distention.  Musculoskeletal: No lower extremity tenderness nor edema. No gross deformities of extremities. Neurologic:  Normal speech and language. No gross focal neurologic deficits are appreciated.  Skin:  Skin is warm, dry and intact. No rash noted.   ____________________________________________   LABS (all labs ordered are listed, but only abnormal results are displayed)  Labs Reviewed  COMPREHENSIVE METABOLIC PANEL - Abnormal; Notable for the following components:      Result Value   Glucose, Bld 136 (*)    BUN 7 (*)    Total Protein 6.3 (*)    All other components within normal limits  CBC WITH DIFFERENTIAL/PLATELET - Abnormal; Notable for the following components:   WBC 12.9 (*)    Neutro Abs 11.8 (*)    Lymphs Abs 0.6 (*)    All other components within normal limits  TROPONIN I (HIGH SENSITIVITY) - Abnormal; Notable for the following components:   Troponin I (High Sensitivity) 21 (*)    All other components within normal limits  TROPONIN I (HIGH  SENSITIVITY) - Abnormal; Notable for the following components:   Troponin I (High Sensitivity) 30 (*)    All other components within normal limits  TROPONIN I (HIGH SENSITIVITY) - Abnormal; Notable for the following components:   Troponin I (High Sensitivity) 31 (*)    All other components within normal limits  TROPONIN I (HIGH SENSITIVITY) - Abnormal; Notable for the following components:   Troponin I (High Sensitivity) 29 (*)    All other components within normal limits  RESP PANEL BY RT-PCR (FLU A&B, COVID) [id number]  URINE CULTURE  CULTURE, BLOOD (ROUTINE X 2)  CULTURE, BLOOD (ROUTINE X 2)  LIPASE, BLOOD  URINALYSIS, ROUTINE W REFLEX MICROSCOPIC   ____________________________________________  EKG  Rate: 100 A fib rhythm. Occasional PVC. Narrow QRS. No STEMI  ____________________________________________  RADIOLOGY  DG Chest 2 View  Result Date: 12/20/2019 CLINICAL DATA:  Sudden onset vomiting. Shaking and cold intolerance. Generalized weakness. EXAM: CHEST - 2 VIEW COMPARISON:  12/04/2019 FINDINGS: Heart size  and pulmonary vascularity are normal. Emphysematous changes and scattered fibrosis in the lungs. No airspace disease, consolidation, effusion, or pneumothorax. Slight scarring in the mid lungs. No change since prior study. Mediastinal contours appear intact. IMPRESSION: Emphysematous changes and fibrosis in the lungs. No evidence of active pulmonary disease. Electronically Signed   By: Lucienne Capers M.D.   On: 12/20/2019 00:16   CT Head Wo Contrast  Result Date: 12/20/2019 CLINICAL DATA:  Sudden onset vomiting, shaking, and cold intolerance. Mental status change. EXAM: CT HEAD WITHOUT CONTRAST TECHNIQUE: Contiguous axial images were obtained from the base of the skull through the vertex without intravenous contrast. COMPARISON:  11/14/2019 FINDINGS: Brain: No evidence of acute infarction, hemorrhage, extra-axial collection or mass lesion/mass effect. Prominent diffuse  cerebral atrophy. Diffuse ventricular dilatation is probably due to central atrophy but could indicate normal pressure hydrocephalus. Low-attenuation changes in the deep white matter consistent small vessel ischemia. Old thalamic lacunar infarcts. Vascular: Moderate intracranial arterial vascular calcifications. Skull: The calvarium appears intact. Sinuses/Orbits: Paranasal sinuses and mastoid air cells are clear. Other: None. IMPRESSION: 1. No acute intracranial abnormality. 2. Prominent diffuse cerebral atrophy. Diffuse ventricular dilatation is probably due to central atrophy but could indicate normal pressure hydrocephalus. 3. Chronic small vessel ischemia. Old thalamic lacunar infarcts. Electronically Signed   By: Lucienne Capers M.D.   On: 12/20/2019 03:00   CT ABDOMEN PELVIS W CONTRAST  Result Date: 12/20/2019 CLINICAL DATA:  Sudden onset nausea and vomiting.  Cold intolerance. EXAM: CT ABDOMEN AND PELVIS WITH CONTRAST TECHNIQUE: Multidetector CT imaging of the abdomen and pelvis was performed using the standard protocol following bolus administration of intravenous contrast. CONTRAST:  111mL OMNIPAQUE IOHEXOL 300 MG/ML  SOLN COMPARISON:  CT angiogram of the pelvis 11/20/2019. CT abdomen and pelvis 02/18/2016 FINDINGS: Lower chest: Emphysematous changes and mild subpleural peripheral fibrosis in the lung bases. Minimal left pleural effusion with atelectasis in the left base. Focal pleural base nodularity in the right lung base appears unchanged since prior study. Cardiac enlargement. Coronary artery calcifications. Small esophageal hiatal hernia. Hepatobiliary: No focal liver abnormality is seen. No gallstones, gallbladder wall thickening, or biliary dilatation. Pancreas: Unremarkable. No pancreatic ductal dilatation or surrounding inflammatory changes. Spleen: Normal in size without focal abnormality. Adrenals/Urinary Tract: Adrenal glands are unremarkable. Kidneys are normal, without renal calculi,  focal lesion, or hydronephrosis. Bladder wall is diffusely thickened suggesting cystitis. Stomach/Bowel: Stomach, small bowel, and colon are mostly decompressed throughout. Colonic diverticulosis without evidence of diverticulitis. No inflammatory changes are appreciated although under distention limits evaluation. Appendix is normal. Vascular/Lymphatic: Aortic atherosclerosis. No enlarged abdominal or pelvic lymph nodes. Reproductive: Status post hysterectomy. No adnexal masses. Other: No free air or free fluid in the abdomen. Abdominal wall musculature appears intact. There is a small low-attenuation fluid collection in the right inferior groin region measuring 1.8 x 3 cm in diameter. This corresponds to location of a previous hematoma on the prior study and likely represents a residual organized hematoma or seroma. A small abscess would be a less likely consideration. Musculoskeletal: Prominent degenerative changes throughout the lumbar spine. No destructive bone lesions. IMPRESSION: 1. No evidence of bowel obstruction or inflammation. 2. Colonic diverticulosis without evidence of diverticulitis. 3. Small low-attenuation fluid collection in the right inferior groin region corresponds to location of a previous hematoma on the prior study and likely represents a residual organized hematoma or seroma. A small abscess would be a less likely consideration. 4. Emphysematous changes and mild subpleural fibrosis in the lung bases. Minimal left pleural  effusion with atelectasis in the left base. Focal pleural base nodularity in the right lung base appears unchanged since prior study. 5. Small esophageal hiatal hernia. 6. Aortic atherosclerosis. Aortic Atherosclerosis (ICD10-I70.0) and Emphysema (ICD10-J43.9). Electronically Signed   By: Lucienne Capers M.D.   On: 12/20/2019 02:56    ____________________________________________   PROCEDURES  Procedure(s) performed:   Procedures  None    ____________________________________________   INITIAL IMPRESSION / ASSESSMENT AND PLAN / ED COURSE  Pertinent labs & imaging results that were available during my care of the patient were reviewed by me and considered in my medical decision making (see chart for details).   Patient presents emergency department with generalized weakness along with nausea vomiting.  She has mild tenderness on abdominal exam and I plan for CT abdomen pelvis for further evaluation.  She had a very complicated hospitalization and rehabilitation.  As described above.  She has no focal neurologic deficits.  Oxygen saturation is normal.  Considering atypical ACS but lower suspicion for this clinically.  Will repeat UA and give IV fluids along with Zofran with reassessment after CT and other imaging.  Do plan for CT imaging of the head given her stroke with small amount of subarachnoid hemorrhage.  She did have some vomiting but lower suspicion at this is as a result of intracranial hemorrhage.  CT reviewed. No acute findings. Patient feeling better with IVF and Zofran. No vomiting. Tolerating PO here. Mild leukocytosis. No vitals or other concern for developing sepsis. Will send blood cultures. Patient was started on abx yesterday for UTI which likely explains mild leukocytosis. CT imaging and CXR along with COVID negative. Patient up and ambulatory here without difficulty. Will discharge home with instructions to push IVF and continue abx. Will f/u with PCP in the coming week. Discussed strict ED return precautions.  ____________________________________________  FINAL CLINICAL IMPRESSION(S) / ED DIAGNOSES  Final diagnoses:  Generalized weakness  Non-intractable vomiting with nausea, unspecified vomiting type     MEDICATIONS GIVEN DURING THIS VISIT:  Medications  sodium chloride 0.9 % bolus 500 mL (0 mLs Intravenous Stopped 12/20/19 0306)  ondansetron (ZOFRAN) injection 4 mg (4 mg Intravenous Given 12/20/19 0057)   iohexol (OMNIPAQUE) 300 MG/ML solution 100 mL (100 mLs Intravenous Contrast Given 12/20/19 0235)    Note:  This document was prepared using Dragon voice recognition software and may include unintentional dictation errors.  Nanda Quinton, MD, [facility] Emergency Medicine    Lucky Trotta, [facility], MD 12/25/19 [phone]

## 2019-12-19 NOTE — ED Triage Notes (Signed)
Pt BIB EMS for sudden onset of vomiting. EMS found pt shaking and c/o cold intolerance. Continued to experience emesis in truck. EMS also noted general weakness that deviates from baseline-- had to assist pt with ambulation.   Discharged from [facility] for stroke 1-2 weeks ago. Per EMS, pt also contracted salmonella while in hospital.  Hx atrial fibrillation.   Per EMS, PVCs noted on EKG; pt denies hx of PVCs  EMS: 140/100 Pulse 110 RR 18 98% RA

## 2019-12-20 ENCOUNTER — Emergency Department (HOSPITAL_COMMUNITY): Payer: Medicare Other

## 2019-12-20 DIAGNOSIS — R112 Nausea with vomiting, unspecified: Secondary | ICD-10-CM | POA: Diagnosis not present

## 2019-12-20 DIAGNOSIS — R251 Tremor, unspecified: Secondary | ICD-10-CM | POA: Diagnosis not present

## 2019-12-20 DIAGNOSIS — R1110 Vomiting, unspecified: Secondary | ICD-10-CM | POA: Diagnosis not present

## 2019-12-20 DIAGNOSIS — I6782 Cerebral ischemia: Secondary | ICD-10-CM | POA: Diagnosis not present

## 2019-12-20 DIAGNOSIS — R4182 Altered mental status, unspecified: Secondary | ICD-10-CM | POA: Diagnosis not present

## 2019-12-20 DIAGNOSIS — J90 Pleural effusion, not elsewhere classified: Secondary | ICD-10-CM | POA: Diagnosis not present

## 2019-12-20 DIAGNOSIS — R531 Weakness: Secondary | ICD-10-CM | POA: Diagnosis not present

## 2019-12-20 DIAGNOSIS — J439 Emphysema, unspecified: Secondary | ICD-10-CM | POA: Diagnosis not present

## 2019-12-20 LAB — CBC WITH DIFFERENTIAL/PLATELET
Abs Immature Granulocytes: 0.06 10*3/uL (ref 0.00–0.07)
Basophils Absolute: 0 10*3/uL (ref 0.0–0.1)
Basophils Relative: 0 %
Eosinophils Absolute: 0 10*3/uL (ref 0.0–0.5)
Eosinophils Relative: 0 %
HCT: 39 % (ref 36.0–46.0)
Hemoglobin: 12.1 g/dL (ref 12.0–15.0)
Immature Granulocytes: 1 %
Lymphocytes Relative: 4 %
Lymphs Abs: 0.6 10*3/uL — ABNORMAL LOW (ref 0.7–4.0)
MCH: 31 pg (ref 26.0–34.0)
MCHC: 31 g/dL (ref 30.0–36.0)
MCV: 100 fL (ref 80.0–100.0)
Monocytes Absolute: 0.5 10*3/uL (ref 0.1–1.0)
Monocytes Relative: 4 %
Neutro Abs: 11.8 10*3/uL — ABNORMAL HIGH (ref 1.7–7.7)
Neutrophils Relative %: 91 %
Platelets: 308 10*3/uL (ref 150–400)
RBC: 3.9 MIL/uL (ref 3.87–5.11)
RDW: 14.2 % (ref 11.5–15.5)
WBC: 12.9 10*3/uL — ABNORMAL HIGH (ref 4.0–10.5)
nRBC: 0 % (ref 0.0–0.2)

## 2019-12-20 LAB — COMPREHENSIVE METABOLIC PANEL
ALT: 15 U/L (ref 0–44)
AST: 24 U/L (ref 15–41)
Albumin: 3.5 g/dL (ref 3.5–5.0)
Alkaline Phosphatase: 62 U/L (ref 38–126)
Anion gap: 13 (ref 5–15)
BUN: 7 mg/dL — ABNORMAL LOW (ref 8–23)
CO2: 24 mmol/L (ref 22–32)
Calcium: 9.1 mg/dL (ref 8.9–10.3)
Chloride: 103 mmol/L (ref 98–111)
Creatinine, Ser: 0.87 mg/dL (ref 0.44–1.00)
GFR, Estimated: >60 mL/min (ref >60)
Glucose, Bld: 136 mg/dL — ABNORMAL HIGH (ref 70–99)
Potassium: 3.6 mmol/L (ref 3.5–5.1)
Sodium: 140 mmol/L (ref 135–145)
Total Bilirubin: 0.9 mg/dL (ref 0.3–1.2)
Total Protein: 6.3 g/dL — ABNORMAL LOW (ref 6.5–8.1)

## 2019-12-20 LAB — TROPONIN I (HIGH SENSITIVITY)
Troponin I (High Sensitivity): 21 ng/L — ABNORMAL HIGH (ref <18)
Troponin I (High Sensitivity): 30 ng/L — ABNORMAL HIGH (ref <18)
Troponin I (High Sensitivity): 31 ng/L — ABNORMAL HIGH (ref <18)
Troponin I (High Sensitivity): 29 ng/L — ABNORMAL HIGH (ref <18)

## 2019-12-20 LAB — RESP PANEL BY RT-PCR (FLU A&B, COVID) ARPGX2
Influenza A by PCR: NEGATIVE
Influenza B by PCR: NEGATIVE
SARS Coronavirus 2 by RT PCR: NEGATIVE

## 2019-12-20 LAB — LIPASE, BLOOD: Lipase: 50 U/L (ref 11–51)

## 2019-12-20 MED ORDER — IOHEXOL 300 MG/ML  SOLN
100.0000 mL | Freq: Once | INTRAMUSCULAR | Status: AC | PRN
  Administered 2019-12-20: 100 mL via INTRAVENOUS

## 2019-12-20 NOTE — Discharge Instructions (Addendum)
You were seen in the emergency department today with nausea and vomiting symptoms along with weakness.  Your lab work here is overall reassuring.  You are feeling better after some IV fluids and nausea medication.  Please continue your antibiotic as prescribed at home.  If you develop new or suddenly worsening symptoms please return to the emergency department immediately.  Please call your primary care doctor first thing tomorrow to schedule follow-up appointment early next week.

## 2019-12-20 NOTE — ED Notes (Signed)
Pt ambulated with NT Annie Main to bathroom with walker (assistive device, baseline) without c/o of dizziness or SOB.

## 2019-12-24 ENCOUNTER — Encounter: Payer: Self-pay | Admitting: Registered Nurse

## 2019-12-24 ENCOUNTER — Ambulatory Visit: Payer: Self-pay

## 2019-12-25 ENCOUNTER — Telehealth: Payer: Self-pay

## 2019-12-25 DIAGNOSIS — D72829 Elevated white blood cell count, unspecified: Secondary | ICD-10-CM | POA: Diagnosis not present

## 2019-12-25 DIAGNOSIS — R112 Nausea with vomiting, unspecified: Secondary | ICD-10-CM | POA: Diagnosis not present

## 2019-12-25 DIAGNOSIS — Z8673 Personal history of transient ischemic attack (TIA), and cerebral infarction without residual deficits: Secondary | ICD-10-CM | POA: Diagnosis not present

## 2019-12-25 DIAGNOSIS — R630 Anorexia: Secondary | ICD-10-CM | POA: Diagnosis not present

## 2019-12-25 DIAGNOSIS — N390 Urinary tract infection, site not specified: Secondary | ICD-10-CM | POA: Diagnosis not present

## 2019-12-25 DIAGNOSIS — I480 Paroxysmal atrial fibrillation: Secondary | ICD-10-CM | POA: Diagnosis not present

## 2019-12-25 LAB — CULTURE, BLOOD (ROUTINE X 2): Culture: NO GROWTH

## 2019-12-25 NOTE — Telephone Encounter (Signed)
Hospital notes reviewed: Verbal okay given to Manuela Schwartz OT for [place] - once weekly for 8 weeks.

## 2019-12-26 ENCOUNTER — Telehealth: Payer: Self-pay | Admitting: Registered Nurse

## 2019-12-26 DIAGNOSIS — I69398 Other sequelae of cerebral infarction: Secondary | ICD-10-CM | POA: Diagnosis not present

## 2019-12-26 DIAGNOSIS — I69318 Other symptoms and signs involving cognitive functions following cerebral infarction: Secondary | ICD-10-CM | POA: Diagnosis not present

## 2019-12-26 DIAGNOSIS — R441 Visual hallucinations: Secondary | ICD-10-CM | POA: Diagnosis not present

## 2019-12-26 DIAGNOSIS — I69354 Hemiplegia and hemiparesis following cerebral infarction affecting left non-dominant side: Secondary | ICD-10-CM | POA: Diagnosis not present

## 2019-12-26 DIAGNOSIS — I69312 Visuospatial deficit and spatial neglect following cerebral infarction: Secondary | ICD-10-CM | POA: Diagnosis not present

## 2019-12-26 DIAGNOSIS — I10 Essential (primary) hypertension: Secondary | ICD-10-CM | POA: Diagnosis not present

## 2019-12-26 NOTE — Telephone Encounter (Signed)
Ms. [name] reported at times she has noticed  Ms Hole closes her right eye to focus, this usually occurs near the end of the day. This provider spoke with Reesa Chew Pa regarding the above. Ms. [name] was educated on alternating eye patch, she verbalizes understanding. Also instructed to give update  with the neurologist and Dr Letta Pate , she verbalizes understanding.  Also instructed to call office with any questions or concerns. She verbalizes understanding.

## 2019-12-27 DIAGNOSIS — I69354 Hemiplegia and hemiparesis following cerebral infarction affecting left non-dominant side: Secondary | ICD-10-CM | POA: Diagnosis not present

## 2019-12-27 DIAGNOSIS — R441 Visual hallucinations: Secondary | ICD-10-CM | POA: Diagnosis not present

## 2019-12-27 DIAGNOSIS — I69312 Visuospatial deficit and spatial neglect following cerebral infarction: Secondary | ICD-10-CM | POA: Diagnosis not present

## 2019-12-27 DIAGNOSIS — I69318 Other symptoms and signs involving cognitive functions following cerebral infarction: Secondary | ICD-10-CM | POA: Diagnosis not present

## 2019-12-27 DIAGNOSIS — I69398 Other sequelae of cerebral infarction: Secondary | ICD-10-CM | POA: Diagnosis not present

## 2019-12-27 DIAGNOSIS — I10 Essential (primary) hypertension: Secondary | ICD-10-CM | POA: Diagnosis not present

## 2019-12-28 DIAGNOSIS — I10 Essential (primary) hypertension: Secondary | ICD-10-CM | POA: Diagnosis not present

## 2019-12-28 DIAGNOSIS — I69354 Hemiplegia and hemiparesis following cerebral infarction affecting left non-dominant side: Secondary | ICD-10-CM | POA: Diagnosis not present

## 2019-12-28 DIAGNOSIS — R441 Visual hallucinations: Secondary | ICD-10-CM | POA: Diagnosis not present

## 2019-12-28 DIAGNOSIS — I69312 Visuospatial deficit and spatial neglect following cerebral infarction: Secondary | ICD-10-CM | POA: Diagnosis not present

## 2019-12-28 DIAGNOSIS — I69398 Other sequelae of cerebral infarction: Secondary | ICD-10-CM | POA: Diagnosis not present

## 2019-12-28 DIAGNOSIS — I69318 Other symptoms and signs involving cognitive functions following cerebral infarction: Secondary | ICD-10-CM | POA: Diagnosis not present

## 2019-12-31 ENCOUNTER — Telehealth: Payer: Self-pay | Admitting: *Deleted

## 2019-12-31 ENCOUNTER — Ambulatory Visit: Payer: Self-pay

## 2019-12-31 NOTE — Telephone Encounter (Signed)
Patient last received her injection on 10/09/19 for Venom MV Every 8 weeks. Her next inj. Was due 12/04/19 but she had a stroke and a severe infection, due to her treatment we pushed out her next inj. To 01/07/20. Is there anything different that needs to be done for her injection such as a split dose?

## 2019-12-31 NOTE — Telephone Encounter (Signed)
She is only 4 weeks out and we can repeat her previous dose of immunotherapy.  Make sure she stays in the clinic for 30 minutes after the immunotherapy.

## 2020-01-01 DIAGNOSIS — I69398 Other sequelae of cerebral infarction: Secondary | ICD-10-CM | POA: Diagnosis not present

## 2020-01-01 DIAGNOSIS — I69312 Visuospatial deficit and spatial neglect following cerebral infarction: Secondary | ICD-10-CM | POA: Diagnosis not present

## 2020-01-01 DIAGNOSIS — R441 Visual hallucinations: Secondary | ICD-10-CM | POA: Diagnosis not present

## 2020-01-01 DIAGNOSIS — I69354 Hemiplegia and hemiparesis following cerebral infarction affecting left non-dominant side: Secondary | ICD-10-CM | POA: Diagnosis not present

## 2020-01-01 DIAGNOSIS — I10 Essential (primary) hypertension: Secondary | ICD-10-CM | POA: Diagnosis not present

## 2020-01-01 DIAGNOSIS — I69318 Other symptoms and signs involving cognitive functions following cerebral infarction: Secondary | ICD-10-CM | POA: Diagnosis not present

## 2020-01-02 ENCOUNTER — Other Ambulatory Visit: Payer: Self-pay | Admitting: Interventional Radiology

## 2020-01-02 DIAGNOSIS — I69354 Hemiplegia and hemiparesis following cerebral infarction affecting left non-dominant side: Secondary | ICD-10-CM | POA: Diagnosis not present

## 2020-01-02 DIAGNOSIS — I69312 Visuospatial deficit and spatial neglect following cerebral infarction: Secondary | ICD-10-CM | POA: Diagnosis not present

## 2020-01-02 DIAGNOSIS — R441 Visual hallucinations: Secondary | ICD-10-CM | POA: Diagnosis not present

## 2020-01-02 DIAGNOSIS — I69398 Other sequelae of cerebral infarction: Secondary | ICD-10-CM | POA: Diagnosis not present

## 2020-01-02 DIAGNOSIS — I10 Essential (primary) hypertension: Secondary | ICD-10-CM | POA: Diagnosis not present

## 2020-01-02 DIAGNOSIS — I69318 Other symptoms and signs involving cognitive functions following cerebral infarction: Secondary | ICD-10-CM | POA: Diagnosis not present

## 2020-01-02 DIAGNOSIS — I729 Aneurysm of unspecified site: Secondary | ICD-10-CM

## 2020-01-03 ENCOUNTER — Ambulatory Visit (INDEPENDENT_AMBULATORY_CARE_PROVIDER_SITE_OTHER): Payer: Medicare Other | Admitting: Cardiology

## 2020-01-03 ENCOUNTER — Other Ambulatory Visit: Payer: Self-pay

## 2020-01-03 ENCOUNTER — Encounter: Payer: Self-pay | Admitting: Cardiology

## 2020-01-03 VITALS — BP 100/60 | HR 114 | Ht 60.0 in | Wt 136.0 lb

## 2020-01-03 DIAGNOSIS — I69318 Other symptoms and signs involving cognitive functions following cerebral infarction: Secondary | ICD-10-CM | POA: Diagnosis not present

## 2020-01-03 DIAGNOSIS — I471 Supraventricular tachycardia: Secondary | ICD-10-CM

## 2020-01-03 DIAGNOSIS — I69312 Visuospatial deficit and spatial neglect following cerebral infarction: Secondary | ICD-10-CM | POA: Diagnosis not present

## 2020-01-03 DIAGNOSIS — R441 Visual hallucinations: Secondary | ICD-10-CM | POA: Diagnosis not present

## 2020-01-03 DIAGNOSIS — I63531 Cerebral infarction due to unspecified occlusion or stenosis of right posterior cerebral artery: Secondary | ICD-10-CM | POA: Diagnosis not present

## 2020-01-03 DIAGNOSIS — I69354 Hemiplegia and hemiparesis following cerebral infarction affecting left non-dominant side: Secondary | ICD-10-CM | POA: Diagnosis not present

## 2020-01-03 DIAGNOSIS — I10 Essential (primary) hypertension: Secondary | ICD-10-CM | POA: Diagnosis not present

## 2020-01-03 DIAGNOSIS — I69398 Other sequelae of cerebral infarction: Secondary | ICD-10-CM | POA: Diagnosis not present

## 2020-01-03 DIAGNOSIS — I483 Typical atrial flutter: Secondary | ICD-10-CM | POA: Diagnosis not present

## 2020-01-03 NOTE — Progress Notes (Signed)
Cardiology Office Note:    Date:  01/03/2020   ID:  [name], DOB 1931-10-01, MRN [id number]  PCP:  Crist Infante, MD  [facility] Cardiologist:  Candee Furbish, MD  [facility] Electrophysiologist:  None   Referring MD: Crist Infante, MD    History of Present Illness:    [name] is a 86 y.o. female here for the follow-up of recent stroke, embolic with return of atrial fibrillation.  She was admitted with stroke in October 2021 with left-sided hemiparesis and right gaze preference.  Proximal right PCA occlusion.  Had angiography performed.  Pseudoaneurysm of common femoral artery noted.  Thrombin injection.  Good results.  Eliquis was added on October 24.  Hemoglobin dropped.  Treated again for the pseudoaneurysm.  Had a flutter ablation by Dr. Lovena Le in 2007. In 2014 she sustained a fall and an event monitor showed short burst of SVT treated conservatively.  Rare palpitations.  I see her husband as well.  She was in the emergency room on 12/19/2019 with generalized weakness.  Acute onset weakness nausea vomiting.  No acute findings were seen on CT scan.  Covid negative.  She was given IV fluids discharged home.  ER notes reviewed.  Past Medical History:  Diagnosis Date  . Atrial fibrillation ([place])    Prior treatment with amiodarone, held sinus rhythm and drug stopped  . Atrial flutter ([place])    Status post ablation prior to  2009  . Chest pain    Nuclear, 2004, no scar or ischemia, EF 77%  . Ejection fraction    EF 70%, nuclear,  2004  //     . Fall    July, 2014  . H/O bladder repair surgery   . Right Achilles tendinitis   . Sciatica     Past Surgical History:  Procedure Laterality Date  . IR CT HEAD LTD  11/14/2019  . IR PERCUTANEOUS ART THROMBECTOMY/INFUSION INTRACRANIAL INC DIAG ANGIO  11/14/2019  . IR US GUIDE VASC ACCESS RIGHT  11/14/2019  . no surgical hx    . RADIOLOGY WITH ANESTHESIA N/A 11/13/2019   Procedure: IR WITH ANESTHESIA;  Surgeon:  Luanne Bras, MD;  Location: [place];  Service: Radiology;  Laterality: N/A;    Current Medications: Current Meds  Medication Sig  . apixaban (ELIQUIS) 5 MG TABS tablet Take 1 tablet (5 mg total) by mouth 2 (two) times daily.  Marland Kitchen atorvastatin (LIPITOR) 10 MG tablet Take 10 mg by mouth daily.  . Cholecalciferol (VITAMIN D3) 50 MCG (2000 UT) CAPS Take 2,000 Units by mouth daily.  . Coenzyme Q10 (CO Q-10 PO) Take 1 tablet by mouth daily.  . Diclofenac Sodium 2 % SOLN Pennsaid 20 mg/gram/actuation (2 %) topical soln in metered-dose pump  APPLY 2 PUMPS (40 MG) TO THE AFFECTED AREA BY TOPICAL ROUTE 2 TIMES PER DAY  . EPINEPHrine 0.3 mg/0.3 mL IJ SOAJ injection epinephrine 0.3 mg/0.3 mL injection, auto-injector  INJECT INTRAMUSCULARLY AS DIRECTED FOR ANAPHYLAXIS     Allergies:   Bee venom, Mixed vespid venom, Codeine, Sulfa antibiotics, and Erythromycin   Social History   Socioeconomic History  . Marital status: Married    Spouse name: Not on file  . Number of children: Not on file  . Years of education: Not on file  . Highest education level: Not on file  Occupational History  . Not on file  Tobacco Use  . Smoking status: Former Research scientist (life sciences)  . Smokeless tobacco: Never Used  Vaping Use  .  Vaping Use: Never used  Substance and Sexual Activity  . Alcohol use: Yes  . Drug use: Never  . Sexual activity: Not on file  Other Topics Concern  . Not on file  Social History Narrative  . Not on file   Social Determinants of Health   Financial Resource Strain: Not on file  Food Insecurity: Not on file  Transportation Needs: Not on file  Physical Activity: Not on file  Stress: Not on file  Social Connections: Not on file     Family History: The patient's family history includes Cancer in her father and mother; Heart disease in her brother.  ROS:   Please see the history of present illness.     All other systems reviewed and are negative.  EKGs/Labs/Other Studies Reviewed:    The  following studies were reviewed today:  ECHO 11/14/19  1. Left ventricular ejection fraction, by estimation, is 60 to 65%. The  left ventricle has normal function. The left ventricle has no regional  wall motion abnormalities. There is severe asymmetric left ventricular  hypertrophy of the basal-septal  segment. Left ventricular diastolic function could not be evaluated.  2. Right ventricular systolic function is normal. The right ventricular  size is normal. There is mildly elevated pulmonary artery systolic  pressure.  3. Left atrial size was moderately dilated.  4. A small pericardial effusion is present. The pericardial effusion is  circumferential.  5. The mitral valve is normal in structure. Trivial mitral valve  regurgitation. No evidence of mitral stenosis. Moderate mitral annular  calcification.  6. The aortic valve is tricuspid. There is mild calcification of the  aortic valve. There is mild thickening of the aortic valve. Aortic valve  regurgitation is trivial. Mild aortic valve sclerosis is present, with no  evidence of aortic valve stenosis.  7. The inferior vena cava is normal in size with greater than 50%  respiratory variability, suggesting right atrial pressure of 3 mmHg.   Conclusion(s)/Recommendation(s): No intracardiac source of embolism  detected on this transthoracic study. A transesophageal echocardiogram is  recommended to exclude cardiac source of embolism if clinically indicated.  Basal septal hypertrophy of LV that  is severe, otherwise diffuse moderate LVH. No focal wall motion  abnormalities.   EKG:  EKG is not ordered today.  EKG in October 2021 showed atrial fibrillation rate controlled prior EKG 2019 showed sinus rhythm first-degree AV block with PR interval 230 ms heart rate 86 bpm.  Recent Labs: 11/14/2019: TSH 2.314 12/07/2019: Magnesium 2.0 12/19/2019: ALT 15; BUN 7; Creatinine, Ser 0.87; Hemoglobin 12.1; Platelets 308; Potassium 3.6;  Sodium 140  Recent Lipid Panel    Component Value Date/Time   CHOL 70 11/14/2019 0408   TRIG 126 11/14/2019 0408   TRIG 127 11/14/2019 0408   HDL 19 (L) 11/14/2019 0408   CHOLHDL 3.7 11/14/2019 0408   VLDL 25 11/14/2019 0408   LDLCALC 26 11/14/2019 0408     Risk Assessment/Calculations:       Physical Exam:    VS:  BP 100/60 (BP Location: Left Arm, Patient Position: Sitting, Cuff Size: Normal)   Pulse (!) 114   Ht 5' (1.524 m)   Wt 136 lb (61.7 kg)   LMP  (LMP Unknown)   SpO2 97%   BMI 26.56 kg/m     Wt Readings from Last 3 Encounters:  01/03/20 136 lb (61.7 kg)  12/20/19 139 lb (63 kg)  12/17/19 137 lb 9.6 oz (62.4 kg)     GEN:  Well nourished, well developed in no acute distress HEENT: Normal NECK: No JVD; No carotid bruits LYMPHATICS: No lymphadenopathy CARDIAC: irreg normal rate, no murmurs, rubs, gallops RESPIRATORY:  Clear to auscultation without rales, wheezing or rhonchi  ABDOMEN: Soft, non-tender, non-distended MUSCULOSKELETAL:  No edema; No deformity  SKIN: Warm and dry NEUROLOGIC:  Alert and oriented x 3 PSYCHIATRIC:  Normal affect   ASSESSMENT:    1. SVT (supraventricular tachycardia) ([place])   2. Typical atrial flutter ([facility])    PLAN:    In order of problems listed above:  Stroke -Continue with Eliquis.  Other than generalized weakness, no hemiparesis noted.  She states that she is doing well with her leg strength and arm strength.  She just overall feels tired.  Status post mechanical thrombectomy.  Continue with PT OT.  Atrial fibrillation persistent -Overall good rate control without AV nodal blocking agents.  She is on Eliquis for anticoagulation.  Needs this post stroke.  We had lengthy discussion today about the pros and cons of potential cardioversion.  I warned her that this may not resolve her generalized weakness.  At this time, she would like to hold off on any further procedures in the hospital.  I am fine with this.  Her rate is well  controlled.  I will see her back however in 2 months.  We can always consider cardioversion then once again.  However I warned her that the longer that she is in atrial fibrillation the harder it may be to get her out of it or to maintain rhythm control.  Atrial flutter post ablation -2007 Dr. Lovena Le.   SVT -Brief episode 2014 event monitor.  Stable with no further evaluation necessary.  First-degree AV block -Noted when she was in normal rhythm.  Carotid artery plaque -  LDL has been excellent.  26 on prior labs.  Mild previously noted  Time spent: 45 minutes spent with extensive review of hospital records, studies, echocardiogram, prior stroke records thrombectomy.  Medication Adjustments/Labs and Tests Ordered: Current medicines are reviewed at length with the patient today.  Concerns regarding medicines are outlined above.  No orders of the defined types were placed in this encounter.  No orders of the defined types were placed in this encounter.   Patient Instructions  Medication Instructions:  The current medical regimen is effective;  continue present plan and medications.  *If you need a refill on your cardiac medications before your next appointment, please call your pharmacy*  Follow-Up: At [facility], you and your health needs are our priority.  As part of our continuing mission to provide you with exceptional heart care, we have created designated Provider Care Teams.  These Care Teams include your primary Cardiologist (physician) and Advanced Practice Providers (APPs -  Physician Assistants and Nurse Practitioners) who all work together to provide you with the care you need, when you need it.  We recommend signing up for the patient portal called "MyChart".  Sign up information is provided on this After Visit Summary.  MyChart is used to connect with patients for Virtual Visits (Telemedicine).  Patients are able to view lab/test results, encounter notes, upcoming  appointments, etc.  Non-urgent messages can be sent to your provider as well.   To learn more about what you can do with MyChart, go to [email or web address].    Your next appointment:   2 month(s)  The format for your next appointment:   In Person  Provider:   Candee Furbish, MD  Thank you for choosing [facility]!!        Signed, Candee Furbish, MD  01/03/2020 5:12 PM    [place]

## 2020-01-03 NOTE — Patient Instructions (Signed)
Medication Instructions:  The current medical regimen is effective;  continue present plan and medications.  *If you need a refill on your cardiac medications before your next appointment, please call your pharmacy*  Follow-Up: At [facility], you and your health needs are our priority.  As part of our continuing mission to provide you with exceptional heart care, we have created designated Provider Care Teams.  These Care Teams include your primary Cardiologist (physician) and Advanced Practice Providers (APPs -  Physician Assistants and Nurse Practitioners) who all work together to provide you with the care you need, when you need it.  We recommend signing up for the patient portal called "MyChart".  Sign up information is provided on this After Visit Summary.  MyChart is used to connect with patients for Virtual Visits (Telemedicine).  Patients are able to view lab/test results, encounter notes, upcoming appointments, etc.  Non-urgent messages can be sent to your provider as well.   To learn more about what you can do with MyChart, go to [email or web address].    Your next appointment:   2 month(s)  The format for your next appointment:   In Person  Provider:   Mark Skains, MD   Thank you for choosing [facility]!!     

## 2020-01-04 ENCOUNTER — Telehealth: Payer: Self-pay | Admitting: *Deleted

## 2020-01-04 DIAGNOSIS — I69318 Other symptoms and signs involving cognitive functions following cerebral infarction: Secondary | ICD-10-CM | POA: Diagnosis not present

## 2020-01-04 DIAGNOSIS — I69354 Hemiplegia and hemiparesis following cerebral infarction affecting left non-dominant side: Secondary | ICD-10-CM | POA: Diagnosis not present

## 2020-01-04 DIAGNOSIS — I69398 Other sequelae of cerebral infarction: Secondary | ICD-10-CM | POA: Diagnosis not present

## 2020-01-04 DIAGNOSIS — R441 Visual hallucinations: Secondary | ICD-10-CM | POA: Diagnosis not present

## 2020-01-04 DIAGNOSIS — I69312 Visuospatial deficit and spatial neglect following cerebral infarction: Secondary | ICD-10-CM | POA: Diagnosis not present

## 2020-01-04 DIAGNOSIS — I10 Essential (primary) hypertension: Secondary | ICD-10-CM | POA: Diagnosis not present

## 2020-01-04 NOTE — Telephone Encounter (Signed)
Shelda Pal [facility] called to modify ST POC to 1wk5 beginning next week 01/07/20.  Approval given.

## 2020-01-06 ENCOUNTER — Other Ambulatory Visit: Payer: Self-pay | Admitting: Cardiology

## 2020-01-07 ENCOUNTER — Ambulatory Visit: Payer: Self-pay

## 2020-01-07 NOTE — Telephone Encounter (Signed)
Called and spoke with the patient's son in regards to how she is feeling. He states that she has her days where she is very fatigued and sleeps longer and doesn't seem to feel well. I advised that I would feel more comfortable if the patient came in for an office visit with Dr. Neldon Mc first before administering the venom since she has been having so many issues lately with her health. Patient's son verbalized understanding. She has been scheduled to come in and see Dr. Neldon Mc on 01/08/20.

## 2020-01-07 NOTE — Telephone Encounter (Signed)
Prescription refill request for Eliquis received.  Hx: afib Last office visit: Skains 01/03/2020 Scr: 0.87, 12/19/2019 Age: 86   Weight: 61.7 kg   Prescription refill sent.

## 2020-01-07 NOTE — Telephone Encounter (Signed)
Patient's son called and was wondering if she should come in for her shot today.

## 2020-01-08 ENCOUNTER — Other Ambulatory Visit: Payer: Self-pay

## 2020-01-08 ENCOUNTER — Encounter: Payer: Self-pay | Admitting: Allergy and Immunology

## 2020-01-08 ENCOUNTER — Ambulatory Visit (INDEPENDENT_AMBULATORY_CARE_PROVIDER_SITE_OTHER): Payer: Medicare Other | Admitting: Allergy and Immunology

## 2020-01-08 VITALS — BP 122/78 | HR 90 | Resp 16

## 2020-01-08 DIAGNOSIS — T63441D Toxic effect of venom of bees, accidental (unintentional), subsequent encounter: Secondary | ICD-10-CM

## 2020-01-08 DIAGNOSIS — I69312 Visuospatial deficit and spatial neglect following cerebral infarction: Secondary | ICD-10-CM | POA: Diagnosis not present

## 2020-01-08 DIAGNOSIS — I69318 Other symptoms and signs involving cognitive functions following cerebral infarction: Secondary | ICD-10-CM | POA: Diagnosis not present

## 2020-01-08 DIAGNOSIS — I63531 Cerebral infarction due to unspecified occlusion or stenosis of right posterior cerebral artery: Secondary | ICD-10-CM | POA: Diagnosis not present

## 2020-01-08 DIAGNOSIS — I69398 Other sequelae of cerebral infarction: Secondary | ICD-10-CM | POA: Diagnosis not present

## 2020-01-08 DIAGNOSIS — R441 Visual hallucinations: Secondary | ICD-10-CM | POA: Diagnosis not present

## 2020-01-08 DIAGNOSIS — I10 Essential (primary) hypertension: Secondary | ICD-10-CM | POA: Diagnosis not present

## 2020-01-08 DIAGNOSIS — I69354 Hemiplegia and hemiparesis following cerebral infarction affecting left non-dominant side: Secondary | ICD-10-CM | POA: Diagnosis not present

## 2020-01-08 NOTE — Progress Notes (Signed)
Many   Follow-up Note  Referring Provider: Crist Infante, MD Primary Provider: Crist Infante, MD Date of Office Visit: 01/08/2020  Subjective:   [name] (DOB: [birth year]) is a 86 y.o. 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  Her last visit to this clinic was 08 August 2018.  She continues on immunotherapy directed against hymenoptera venom at every 8 weeks without any adverse effect.  She has not been stung in the field.  She does have an injectable epinephrine device.  It has been 12 weeks since her last injection.  Allergies as of 01/08/2020      Reactions   Bee Venom Anaphylaxis   Mixed Vespid Venom Anaphylaxis   Codeine Other (See Comments)   Not known   Sulfa Antibiotics Itching, Nausea Only   Erythromycin Nausea And Vomiting      Medication List      atorvastatin 10 MG tablet Commonly known as: LIPITOR Take 10 mg by mouth daily.   CO Q-10 PO Take 1 tablet by mouth daily.   Diclofenac Sodium 2 % Soln Pennsaid 20 mg/gram/actuation (2 %) topical soln in metered-dose pump  APPLY 2 PUMPS (40 MG) TO THE AFFECTED AREA BY TOPICAL ROUTE 2 TIMES PER DAY   Eliquis 5 MG Tabs tablet Generic drug: apixaban TAKE 1 TABLET BY MOUTH TWICE DAILY.   EPINEPHrine 0.3 mg/0.3 mL Soaj injection Commonly known as: EPI-PEN epinephrine 0.3 mg/0.3 mL injection, auto-injector  INJECT INTRAMUSCULARLY AS DIRECTED FOR ANAPHYLAXIS   vitamin D3 50 MCG (2000 UT) Caps Take 2,000 Units by mouth daily.       Past Medical History:  Diagnosis Date  . Atrial fibrillation ([place])    Prior treatment with amiodarone, held sinus rhythm and drug stopped  . Atrial flutter ([place])    Status post ablation prior to  2009  . Chest pain    Nuclear, 2004, no scar or  ischemia, EF 77%  . Ejection fraction    EF 70%, nuclear,  2004  //     . Fall    July, 2014  . H/O bladder repair surgery   . Right Achilles tendinitis   . Sciatica     Past Surgical History:  Procedure Laterality Date  . IR CT HEAD LTD  11/14/2019  . IR PERCUTANEOUS ART THROMBECTOMY/INFUSION INTRACRANIAL INC DIAG ANGIO  11/14/2019  . IR US GUIDE VASC ACCESS RIGHT  11/14/2019  . no surgical hx    . RADIOLOGY WITH ANESTHESIA N/A 11/13/2019   Procedure: IR WITH ANESTHESIA;  Surgeon: Luanne Bras, MD;  Location: [place];  Service: Radiology;  Laterality: N/A;    Review of systems negative except as noted in HPI / PMHx or noted below:  Review of Systems  Constitutional: Negative.   HENT: Negative.   Eyes: Negative.   Respiratory: Negative.   Cardiovascular: Negative.   Gastrointestinal: Negative.   Genitourinary: Negative.   Musculoskeletal: Negative.   Skin: Negative.   Neurological: Negative.   Endo/Heme/Allergies: Negative.   Psychiatric/Behavioral: Negative.      Objective:   Vitals:   01/08/20 1630  BP: 122/78  Pulse: 90  Resp: 16  SpO2: 93%          Physical Exam Constitutional:      Appearance: She is not diaphoretic.  HENT:  Head: Normocephalic.     Right Ear: Tympanic membrane, ear canal and external ear normal.     Left Ear: Tympanic membrane, ear canal and external ear normal.     Nose: Nose normal. No mucosal edema or rhinorrhea.     Mouth/Throat:     Mouth: Oropharynx is clear and moist and mucous membranes are normal.     Pharynx: Uvula midline. No oropharyngeal exudate.  Eyes:     Conjunctiva/sclera: Conjunctivae normal.  Neck:     Thyroid: No thyromegaly.     Trachea: Trachea normal. No tracheal tenderness or tracheal deviation.  Cardiovascular:     Rate and Rhythm: Normal rate and regular rhythm.     Heart sounds: Normal heart sounds, S1 normal and S2 normal. No murmur heard.   Pulmonary:     Effort: No respiratory distress.      Breath sounds: Normal breath sounds. No stridor. No wheezing or rales.  Musculoskeletal:        General: No edema.  Lymphadenopathy:     Head:     Right side of head: No tonsillar adenopathy.     Left side of head: No tonsillar adenopathy.     Cervical: No cervical adenopathy.  Skin:    Findings: No erythema or rash.     Nails: There is no clubbing.  Neurological:     Mental Status: She is alert.     Diagnostics: None  Assessment and Plan:   1. Toxic effect of venom of bees, unintentional, subsequent encounter     1.  Continue hymenoptera immunotherapy every 8 weeks.  For today's dose use 0.5 mL and with her next 8-week dose used 1.0 mL  2.  Continue EpiPen if needed  3.  Return to clinic every year while using immunotherapy  [name] appears to be doing quite well.  I have made the recommendation that she continue on immunotherapy given the extent of her initial reaction to hymenoptera venom.  I will see her back in his clinic in 1 year or earlier if there is a problem.  Melissa Katz, MD Allergy / Immunology [place]

## 2020-01-08 NOTE — Patient Instructions (Signed)
  1.  Continue hymenoptera immunotherapy every 8 weeks.  For today's dose use 0.5 mL and with her next 8-week dose used 1.0 mL  2.  Continue EpiPen if needed  3.  Return to clinic every year while using immunotherapy

## 2020-01-09 ENCOUNTER — Encounter: Payer: Self-pay | Admitting: Adult Health

## 2020-01-09 ENCOUNTER — Encounter: Payer: Self-pay | Admitting: Allergy and Immunology

## 2020-01-09 ENCOUNTER — Telehealth (INDEPENDENT_AMBULATORY_CARE_PROVIDER_SITE_OTHER): Payer: Medicare Other | Admitting: Adult Health

## 2020-01-09 DIAGNOSIS — I63531 Cerebral infarction due to unspecified occlusion or stenosis of right posterior cerebral artery: Secondary | ICD-10-CM | POA: Diagnosis not present

## 2020-01-09 DIAGNOSIS — I10 Essential (primary) hypertension: Secondary | ICD-10-CM

## 2020-01-09 DIAGNOSIS — E785 Hyperlipidemia, unspecified: Secondary | ICD-10-CM

## 2020-01-09 DIAGNOSIS — I480 Paroxysmal atrial fibrillation: Secondary | ICD-10-CM | POA: Diagnosis not present

## 2020-01-09 NOTE — Progress Notes (Signed)
[facility] [address] [place]. [place] [address] [phone]       HOSPITAL FOLLOW UP NOTE  [name] Date of Birth:  [birth year] Medical Record Number:  [id number]   Reason for Referral:  hospital stroke follow up  Virtual Visit via Video Note  I connected with [name] on 01/09/20 at  2:15 PM EST by a video enabled telemedicine application located in office and verified that I am speaking with the correct person using two identifiers who was located at their own home accompanied by her daughter, [name].   Laury Deep, CMA schedule visit who discussed the limitations of evaluation and management by telemedicine and the availability of in person appointments. The patient expressed understanding and agreed to proceed.   SUBJECTIVE:   HPI:   MelissaMelissa Lynch a 86 y.o.femalewith history ofAfib/flutter s/p ablation, off AC due to persistent sinus rhythm,and a recent diagnosis of pneumonia(she was not improving on the first round of antibiotics tried and therefore was broadened to Augmentin on 11/12/2019), who presented on 11/13/2019 with acute onset right gaze preference and left-sided weakness.  Personally reviewed hospitalization pertinent progress notes, lab work and imaging with summary provided.  Initially evaluated by Dr. Erlinda Hong with stroke work-up revealing right thalamic, cerebellar and frontal small infarcts with right PCA occlusion s/p tPA and IR with TICI 3 reperfusion w/ postprocedure small SAH, embolic secondary to AF not on AC.  Initially placed on Eliquis but held after developing right groin pseudoaneurysm w/ hematoma post IR receiving thrombin injection 10/22 by Dr. Earleen Newport with repeat injection 10/27 with resolution.  Followed by [facility] Dr. Doren Custard without surgical repair indicated.  Eliquis restarted 10/28 in setting of recent stroke and known atrial fibrillation. Hx of HTN treated with Cleviprex stabilized with long-term BP goal  normotensive range. Hx of HLD on atorvastatin 10 mg daily with LDL 26 therefore discontinued due to low LDL level.  Other stroke risk factors include advanced age, former tobacco use and EtOH use but no prior stroke history.  Other active problems include acute respiratory failure (resolved) and community-acquired pneumonia, and hx of emphysema, aortic atherosclerosis, acute blood loss anemia and hypokalemia.  Per therapy recommendations, discharge to CIR on 11/23/2019 for ongoing therapy needs.  Stroke: R thalamic, cerebellar, and frontal small infarcts with right PCA occlusion s/p tPA and IR with TICI3 from AF not on AC Small SAH interpeduncular cistern post IR  CT Head - Age indeterminate small vessel disease including in the right thalamus, but no acute cortically based infarct or acute intracranial hemorrhage identified.   CTA H&N - Positive for occlusion of the Right PCA origin.No other large vessel occlusion, and generally mild for age atherosclerosis in the head and neck.   Cerebral angio/ IR- Rt PCA occlusion with TICI 3 flow  CT head - Status post Right PCA endovascular revascularization withtrace subarachnoid blood/contrast in the interpeduncular cistern. Small 10 mm area of contrast staining versus petechial hemorrhage in the medial right thalamus.   MRI head - R thalamus infarct with associated mild hemorrhage as noted on recent CT. There is also mild subarachnoid hemorrhage in the interpeduncular cistern unchangedfrom CT. Additional small areas of acute infarct in the right cerebellum and right frontal cortex. Moderate atrophy   MRA head - Right PCA patent post thrombectomy. There is a mild stenosis in the right PCA at the P1/ P2 junction.   2D Echo - EF 60-65%. No source of embolus. LA moderately dilated  [employer]  Virus 2 - negative  LDL - 26  HgbA1c- 5.7  VTE prophylaxis - Heparin 5000 units sq tid   aspirin 81 mg dailyprior to admission, resumeeEliquis  10/28  Therapy recommendations: HH OT, HH PT ->CIR   Disposition: CIR   Today, 01/09/2020, [name] is being seen for hospital follow-up accompanied by her daughter, [name].  She was discharged home on 12/10/2019 from CIR after a 17-day stay. She does report increased fatigue since her stroke. Denies any weakness, speech/language impairment, or dysphagia.  Does have mild cognitive impairment.  Currently working with [facility] PT/OT/SLP. Denies new or worsening stroke/TIA symptoms. Remains on Eliquis 5 mg twice daily without bleeding or bruising.  Remains on atorvastatin 10 mg daily without myalgias.  Blood pressure monitored by [facility] therapies which has been stable. Dr. Joylene Draft (PCP) recently started her on Remeron 15mg  nightly to help with appetite stimulation.  Denies depression/anxiety.  No further concerns at this time.      ROS:   14 system review of systems performed and negative with exception of fatigue  PMH:  Past Medical History:  Diagnosis Date  . Atrial fibrillation ([place])    Prior treatment with amiodarone, held sinus rhythm and drug stopped  . Atrial flutter ([place])    Status post ablation prior to  2009  . Chest pain    Nuclear, 2004, no scar or ischemia, EF 77%  . Ejection fraction    EF 70%, nuclear,  2004  //     . Fall    July, 2014  . H/O bladder repair surgery   . Right Achilles tendinitis   . Sciatica     PSH:  Past Surgical History:  Procedure Laterality Date  . IR CT HEAD LTD  11/14/2019  . IR PERCUTANEOUS ART THROMBECTOMY/INFUSION INTRACRANIAL INC DIAG ANGIO  11/14/2019  . IR US GUIDE VASC ACCESS RIGHT  11/14/2019  . no surgical hx    . RADIOLOGY WITH ANESTHESIA N/A 11/13/2019   Procedure: IR WITH ANESTHESIA;  Surgeon: Luanne Bras, MD;  Location: [place];  Service: Radiology;  Laterality: N/A;    Social History:  Social History   Socioeconomic History  . Marital status: Married    Spouse name: Not on file  . Number of children: Not on file   . Years of education: Not on file  . Highest education level: Not on file  Occupational History  . Not on file  Tobacco Use  . Smoking status: Former Research scientist (life sciences)  . Smokeless tobacco: Never Used  Vaping Use  . Vaping Use: Never used  Substance and Sexual Activity  . Alcohol use: Yes  . Drug use: Never  . Sexual activity: Not on file  Other Topics Concern  . Not on file  Social History Narrative  . Not on file   Social Determinants of Health   Financial Resource Strain: Not on file  Food Insecurity: Not on file  Transportation Needs: Not on file  Physical Activity: Not on file  Stress: Not on file  Social Connections: Not on file  Intimate Partner Violence: Not on file    Family History:  Family History  Problem Relation Age of Onset  . Cancer Mother   . Cancer Father        pancreatic cancer  . Heart disease Brother     Medications:   Current Outpatient Medications on File Prior to Visit  Medication Sig Dispense Refill  . atorvastatin (LIPITOR) 10 MG tablet Take 10 mg by mouth daily.    Marland Kitchen  Cholecalciferol (VITAMIN D3) 50 MCG (2000 UT) CAPS Take 2,000 Units by mouth daily.    . Coenzyme Q10 (CO Q-10 PO) Take 1 tablet by mouth daily.    . Diclofenac Sodium 2 % SOLN Pennsaid 20 mg/gram/actuation (2 %) topical soln in metered-dose pump  APPLY 2 PUMPS (40 MG) TO THE AFFECTED AREA BY TOPICAL ROUTE 2 TIMES PER DAY    . ELIQUIS 5 MG TABS tablet TAKE 1 TABLET BY MOUTH TWICE DAILY. 60 tablet 5  . EPINEPHrine 0.3 mg/0.3 mL IJ SOAJ injection epinephrine 0.3 mg/0.3 mL injection, auto-injector  INJECT INTRAMUSCULARLY AS DIRECTED FOR ANAPHYLAXIS     No current facility-administered medications on file prior to visit.    Allergies:   Allergies  Allergen Reactions  . Bee Venom Anaphylaxis  . Mixed Vespid Venom Anaphylaxis  . Codeine Other (See Comments)    Not known  . Sulfa Antibiotics Itching and Nausea Only  . Erythromycin Nausea And Vomiting       OBJECTIVE:  Physical Exam General: well developed, well nourished, pleasant elderly Caucasian female, seated, in no evident distress Head: head normocephalic and atraumatic.    Neurologic Exam Mental Status: Awake and fully alert.  Fluent speech and language.  Oriented to place and time. Recent and remote memory intact. Attention span, concentration and fund of knowledge appropriate. Mood and affect appropriate.  Cranial Nerves: Extraocular movements full without nystagmus. Hearing intact to voice. Facial sensation intact. Face, tongue, palate moves normally and symmetrically.  Shoulder shrug symmetric. Motor: No evidence of weakness per drift assessment Sensory.: intact to light touch Coordination: Rapid alternating movements normal in all extremities. Finger-to-nose and heel-to-shin performed accurately bilaterally.      NIHSS  0 Modified Rankin  2     ASSESSMENT: [name] is a 86 y.o. year old female presented with acute onset right gaze preference and left-sided weakness on 11/13/2019 with stroke work-up revealing right thalamic, cerebellar and frontal small with right PCA occlusion s/p tPA and IR with TICI 3 reperfusion, embolic secondary to known AF not on AC. S/p IR small SAH and right groin pseudoaneurysm with hematoma.  Vascular risk factors include AF s/p ablation (off AC d/t persistent sinus rhythm), HTN, HLD, advanced age, former tobacco use and EtOH use.      PLAN:  1. R embolic stroke:  a.  No residual physical deficits. Reports mild cognitive impairment and excessive daytime fatigue.  Educated on post stroke fatigue and hopefully as she further recovers, fatigue will improve.  Also recently treated for recurrent UTI which may also be contributing factor.  Discussed possible underlying sleep apnea especially with recent stroke and known atrial fibrillation possibly contributing.  Patient would like to hold off at this time on sleep study but will call  office if interested in pursuing in the future b. Continue Eliquis (apixaban) daily  and atorvastatin 10 mg daily for secondary stroke prevention.  c. Discussed secondary stroke prevention measures and importance of close PCP follow up for aggressive stroke risk factor management  2. Atrial fibrillation: CHA2DS2-VASc score 6 indicating need of AC.  On Eliquis monitored and managed by cardiology 3. HTN: BP goal <130/90.  Stable at home per daughter.  Monitored/managed by cardiology 4. HLD: LDL goal <70. Recent LDL 26 on atorvastatin 10 mg daily per PCP    Follow up in 4 months or call earlier if needed   CC:  [facility] provider: Dr. Mont Dutton, MD    I spent 45 minutes of  non-face-to-face time via MyChart virtual visit with patient and daughter.  This included previsit chart review including hospitalization pertinent progress notes, lab work and imaging, lab review, study review, order entry, electronic health record documentation, patient education regarding recent stroke including etiology, residual post stroke fatigue, importance of managing stroke risk factors and answered all questions to patient and daughters satisfaction    Frann Rider, AGNP-BC  [facility] [address] [place], [place] [address]  Phone [phone] Fax [phone] Note: This document was prepared with digital dictation and possible smart phrase technology. Any transcriptional errors that result from this process are unintentional.

## 2020-01-10 DIAGNOSIS — I10 Essential (primary) hypertension: Secondary | ICD-10-CM | POA: Diagnosis not present

## 2020-01-10 DIAGNOSIS — B952 Enterococcus as the cause of diseases classified elsewhere: Secondary | ICD-10-CM | POA: Diagnosis not present

## 2020-01-10 DIAGNOSIS — N390 Urinary tract infection, site not specified: Secondary | ICD-10-CM | POA: Diagnosis not present

## 2020-01-10 DIAGNOSIS — K5901 Slow transit constipation: Secondary | ICD-10-CM | POA: Diagnosis not present

## 2020-01-10 DIAGNOSIS — I69318 Other symptoms and signs involving cognitive functions following cerebral infarction: Secondary | ICD-10-CM | POA: Diagnosis not present

## 2020-01-10 DIAGNOSIS — J9601 Acute respiratory failure with hypoxia: Secondary | ICD-10-CM | POA: Diagnosis not present

## 2020-01-10 DIAGNOSIS — I482 Chronic atrial fibrillation, unspecified: Secondary | ICD-10-CM | POA: Diagnosis not present

## 2020-01-10 DIAGNOSIS — I69398 Other sequelae of cerebral infarction: Secondary | ICD-10-CM | POA: Diagnosis not present

## 2020-01-10 DIAGNOSIS — M5430 Sciatica, unspecified side: Secondary | ICD-10-CM | POA: Diagnosis not present

## 2020-01-10 DIAGNOSIS — Z9981 Dependence on supplemental oxygen: Secondary | ICD-10-CM | POA: Diagnosis not present

## 2020-01-10 DIAGNOSIS — Z7901 Long term (current) use of anticoagulants: Secondary | ICD-10-CM | POA: Diagnosis not present

## 2020-01-10 DIAGNOSIS — I69312 Visuospatial deficit and spatial neglect following cerebral infarction: Secondary | ICD-10-CM | POA: Diagnosis not present

## 2020-01-10 DIAGNOSIS — Z87891 Personal history of nicotine dependence: Secondary | ICD-10-CM | POA: Diagnosis not present

## 2020-01-10 DIAGNOSIS — Z86718 Personal history of other venous thrombosis and embolism: Secondary | ICD-10-CM | POA: Diagnosis not present

## 2020-01-10 DIAGNOSIS — I69354 Hemiplegia and hemiparesis following cerebral infarction affecting left non-dominant side: Secondary | ICD-10-CM | POA: Diagnosis not present

## 2020-01-10 DIAGNOSIS — H9190 Unspecified hearing loss, unspecified ear: Secondary | ICD-10-CM | POA: Diagnosis not present

## 2020-01-10 DIAGNOSIS — J189 Pneumonia, unspecified organism: Secondary | ICD-10-CM | POA: Diagnosis not present

## 2020-01-10 DIAGNOSIS — Z8701 Personal history of pneumonia (recurrent): Secondary | ICD-10-CM | POA: Diagnosis not present

## 2020-01-10 DIAGNOSIS — Z87440 Personal history of urinary (tract) infections: Secondary | ICD-10-CM | POA: Diagnosis not present

## 2020-01-10 DIAGNOSIS — B961 Klebsiella pneumoniae [K. pneumoniae] as the cause of diseases classified elsewhere: Secondary | ICD-10-CM | POA: Diagnosis not present

## 2020-01-10 DIAGNOSIS — R441 Visual hallucinations: Secondary | ICD-10-CM | POA: Diagnosis not present

## 2020-01-10 DIAGNOSIS — Z9181 History of falling: Secondary | ICD-10-CM | POA: Diagnosis not present

## 2020-01-10 DIAGNOSIS — I4891 Unspecified atrial fibrillation: Secondary | ICD-10-CM | POA: Diagnosis not present

## 2020-01-10 DIAGNOSIS — G4700 Insomnia, unspecified: Secondary | ICD-10-CM | POA: Diagnosis not present

## 2020-01-10 DIAGNOSIS — I700 Atherosclerosis of aorta: Secondary | ICD-10-CM | POA: Diagnosis not present

## 2020-01-10 DIAGNOSIS — D62 Acute posthemorrhagic anemia: Secondary | ICD-10-CM | POA: Diagnosis not present

## 2020-01-10 DIAGNOSIS — E785 Hyperlipidemia, unspecified: Secondary | ICD-10-CM | POA: Diagnosis not present

## 2020-01-10 DIAGNOSIS — J439 Emphysema, unspecified: Secondary | ICD-10-CM | POA: Diagnosis not present

## 2020-01-11 DIAGNOSIS — I69312 Visuospatial deficit and spatial neglect following cerebral infarction: Secondary | ICD-10-CM | POA: Diagnosis not present

## 2020-01-11 DIAGNOSIS — I10 Essential (primary) hypertension: Secondary | ICD-10-CM | POA: Diagnosis not present

## 2020-01-11 DIAGNOSIS — N390 Urinary tract infection, site not specified: Secondary | ICD-10-CM | POA: Diagnosis not present

## 2020-01-11 DIAGNOSIS — B952 Enterococcus as the cause of diseases classified elsewhere: Secondary | ICD-10-CM | POA: Diagnosis not present

## 2020-01-11 DIAGNOSIS — I69354 Hemiplegia and hemiparesis following cerebral infarction affecting left non-dominant side: Secondary | ICD-10-CM | POA: Diagnosis not present

## 2020-01-11 DIAGNOSIS — B961 Klebsiella pneumoniae [K. pneumoniae] as the cause of diseases classified elsewhere: Secondary | ICD-10-CM | POA: Diagnosis not present

## 2020-01-11 DIAGNOSIS — J189 Pneumonia, unspecified organism: Secondary | ICD-10-CM | POA: Diagnosis not present

## 2020-01-11 DIAGNOSIS — R441 Visual hallucinations: Secondary | ICD-10-CM | POA: Diagnosis not present

## 2020-01-11 DIAGNOSIS — I69318 Other symptoms and signs involving cognitive functions following cerebral infarction: Secondary | ICD-10-CM | POA: Diagnosis not present

## 2020-01-11 DIAGNOSIS — J9601 Acute respiratory failure with hypoxia: Secondary | ICD-10-CM | POA: Diagnosis not present

## 2020-01-11 DIAGNOSIS — I69398 Other sequelae of cerebral infarction: Secondary | ICD-10-CM | POA: Diagnosis not present

## 2020-01-12 ENCOUNTER — Ambulatory Visit (INDEPENDENT_AMBULATORY_CARE_PROVIDER_SITE_OTHER): Payer: Medicare Other

## 2020-01-12 ENCOUNTER — Other Ambulatory Visit: Payer: Self-pay

## 2020-01-12 ENCOUNTER — Encounter (HOSPITAL_COMMUNITY): Payer: Self-pay

## 2020-01-12 ENCOUNTER — Ambulatory Visit (HOSPITAL_COMMUNITY)
Admission: EM | Admit: 2020-01-12 | Discharge: 2020-01-12 | Disposition: A | Discharge status: Home / Self Care | Payer: Medicare Other

## 2020-01-12 ENCOUNTER — Ambulatory Visit (HOSPITAL_COMMUNITY): Admit: 2020-01-12 | Discharge status: Absent without leave | Payer: Self-pay | Source: Home / Self Care

## 2020-01-12 DIAGNOSIS — R0602 Shortness of breath: Secondary | ICD-10-CM

## 2020-01-12 DIAGNOSIS — J90 Pleural effusion, not elsewhere classified: Secondary | ICD-10-CM | POA: Diagnosis not present

## 2020-01-12 DIAGNOSIS — R059 Cough, unspecified: Secondary | ICD-10-CM | POA: Diagnosis not present

## 2020-01-12 DIAGNOSIS — J9811 Atelectasis: Secondary | ICD-10-CM | POA: Diagnosis not present

## 2020-01-12 DIAGNOSIS — R062 Wheezing: Secondary | ICD-10-CM

## 2020-01-12 DIAGNOSIS — J811 Chronic pulmonary edema: Secondary | ICD-10-CM | POA: Diagnosis not present

## 2020-01-12 MED ORDER — ALBUTEROL SULFATE HFA 108 (90 BASE) MCG/ACT IN AERS
INHALATION_SPRAY | RESPIRATORY_TRACT | Status: AC
  Filled 2020-01-12: qty 6.7

## 2020-01-12 MED ORDER — DOXYCYCLINE HYCLATE 100 MG PO TABS: 100.0000 mg | ORAL_TABLET | Freq: Two times a day (BID) | ORAL | 0 refills | Status: DC

## 2020-01-12 MED ORDER — ALBUTEROL SULFATE HFA 108 (90 BASE) MCG/ACT IN AERS
2.0000 | INHALATION_SPRAY | Freq: Once | RESPIRATORY_TRACT | Status: AC
  Administered 2020-01-12: 2 via RESPIRATORY_TRACT

## 2020-01-12 NOTE — ED Triage Notes (Signed)
Pt presents with cough and wheezing x 3 days. States she woke up this morning the bed and her gown was wet as she was sweating. Denies shortness of breath  Pt reports she had pneumonia 3 months ago.   Pt reports the physical therapist hear her lungs yesterday and  lrecommend her to be seen by her PCP.

## 2020-01-12 NOTE — Discharge Instructions (Signed)
Follow up next week with your Primary Care for re-check

## 2020-01-14 DIAGNOSIS — B952 Enterococcus as the cause of diseases classified elsewhere: Secondary | ICD-10-CM | POA: Diagnosis not present

## 2020-01-14 DIAGNOSIS — I69312 Visuospatial deficit and spatial neglect following cerebral infarction: Secondary | ICD-10-CM | POA: Diagnosis not present

## 2020-01-14 DIAGNOSIS — J189 Pneumonia, unspecified organism: Secondary | ICD-10-CM | POA: Diagnosis not present

## 2020-01-14 DIAGNOSIS — R441 Visual hallucinations: Secondary | ICD-10-CM | POA: Diagnosis not present

## 2020-01-14 DIAGNOSIS — I10 Essential (primary) hypertension: Secondary | ICD-10-CM | POA: Diagnosis not present

## 2020-01-14 DIAGNOSIS — B961 Klebsiella pneumoniae [K. pneumoniae] as the cause of diseases classified elsewhere: Secondary | ICD-10-CM | POA: Diagnosis not present

## 2020-01-14 DIAGNOSIS — I69398 Other sequelae of cerebral infarction: Secondary | ICD-10-CM | POA: Diagnosis not present

## 2020-01-14 DIAGNOSIS — N390 Urinary tract infection, site not specified: Secondary | ICD-10-CM | POA: Diagnosis not present

## 2020-01-14 DIAGNOSIS — J9601 Acute respiratory failure with hypoxia: Secondary | ICD-10-CM | POA: Diagnosis not present

## 2020-01-14 DIAGNOSIS — I69318 Other symptoms and signs involving cognitive functions following cerebral infarction: Secondary | ICD-10-CM | POA: Diagnosis not present

## 2020-01-14 DIAGNOSIS — I69354 Hemiplegia and hemiparesis following cerebral infarction affecting left non-dominant side: Secondary | ICD-10-CM | POA: Diagnosis not present

## 2020-01-15 NOTE — ED Provider Notes (Signed)
[place]    CSN: [id number] Arrival date & time: 01/12/20  1304      History   Chief Complaint Chief Complaint  Patient presents with  . Cough  . Wheezing         HPI [name] is a 86 y.o. female.   Presenting today with her daughter for evaluation of cough and wheezing the past 3 days. Woke up sweating this morning but unsure if she's had any fevers. Denies CP, SOB, dizziness, decreased ADLs from baseline, abdominal pain, poor PO intake. Had pneumonia 3 months ago and states she never stopped coughing since this. Has also recently suffered a CVA which she is going to regular therapy sessions for. Has a complicated medical hx including atrial flutter, SVT, CVA, HTN, and emphysema. Former smoker. Not currently on any inhalers and per patient has never needed one. Has not been taking anything OTC for sxs the past few days. States her PT listened to her lungs and recommended she be seen due to wheezing.      Past Medical History:  Diagnosis Date  . Atrial fibrillation ([place])    Prior treatment with amiodarone, held sinus rhythm and drug stopped  . Atrial flutter ([place])    Status post ablation prior to  2009  . Chest pain    Nuclear, 2004, no scar or ischemia, EF 77%  . Ejection fraction    EF 70%, nuclear,  2004  //     . Fall    July, 2014  . H/O bladder repair surgery   . Right Achilles tendinitis   . Sciatica     Patient Active Problem List   Diagnosis Date Noted  . Pseudoaneurysm of femoral artery following procedure ([place]) 11/23/2019  . CAP (community acquired pneumonia) 11/23/2019  . Essential hypertension 11/23/2019  . Hyperlipidemia LDL goal <70 11/23/2019  . Emphysema of lung ([place]) 11/23/2019  . Aortic atherosclerosis ([place]) 11/23/2019  . Acute blood loss anemia 11/23/2019  . Acute ischemic stroke ([place])   . Pseudoaneurysm ([place])   . Slow transit constipation   . Chronic UTI   . Acute ischemic right PCA stroke ([place]) s/p tPA and mechanical  thrombectomy, d/t AF not on [facility] 11/14/2019  . Stroke ([place]) 11/13/2019  . Hymenoptera allergy 10/05/2014  . SVT (supraventricular tachycardia) ([place]) 10/20/2012  . Fall   . Chest pain   . Ejection fraction   . Atrial fibrillation ([place])   . Atrial flutter [facility])     Past Surgical History:  Procedure Laterality Date  . IR CT HEAD LTD  11/14/2019  . IR PERCUTANEOUS ART THROMBECTOMY/INFUSION INTRACRANIAL INC DIAG ANGIO  11/14/2019  . IR US GUIDE VASC ACCESS RIGHT  11/14/2019  . no surgical hx    . RADIOLOGY WITH ANESTHESIA N/A 11/13/2019   Procedure: IR WITH ANESTHESIA;  Surgeon: Luanne Bras, MD;  Location: [facility];  Service: Radiology;  Laterality: N/A;    OB History   No obstetric history on file.      Home Medications    Prior to Admission medications   Medication Sig Start Date End Date Taking? Authorizing Provider  atorvastatin (LIPITOR) 10 MG tablet Take 10 mg by mouth daily.    [provider]  Cholecalciferol (VITAMIN D3) 50 MCG (2000 UT) CAPS Take 2,000 Units by mouth daily.    [provider]  Cyanocobalamin (VITAMIN B12 PO) Take by mouth.    [provider]  Diclofenac Sodium 2 % SOLN Pennsaid 20 mg/gram/actuation (  2 %) topical soln in metered-dose pump  APPLY 2 PUMPS (40 MG) TO THE AFFECTED AREA BY TOPICAL ROUTE 2 TIMES PER DAY    [provider]  doxycycline (VIBRA-TABS) 100 MG tablet Take 1 tablet (100 mg total) by mouth 2 (two) times daily. 01/12/20   Volney American, PA-C  ELIQUIS 5 MG TABS tablet TAKE 1 TABLET BY MOUTH TWICE DAILY. 01/07/20   Jerline Pain, MD  mirtazapine (REMERON) 15 MG tablet Take 15 mg by mouth at bedtime.    [provider]  nitrofurantoin (MACRODANTIN) 100 MG capsule Take 100 mg by mouth at bedtime.    [provider]    Family History Family History  Problem Relation Age of Onset  . Cancer Mother   . Cancer Father        pancreatic cancer  . Heart disease Brother      Social History Social History   Tobacco Use  . Smoking status: Former Research scientist (life sciences)  . Smokeless tobacco: Never Used  Vaping Use  . Vaping Use: Never used  Substance Use Topics  . Alcohol use: Yes  . Drug use: Never     Allergies   Bee venom, Mixed vespid venom, Codeine, Sulfa antibiotics, and Erythromycin   Review of Systems Review of Systems PER HPI    Physical Exam Triage Vital Signs ED Triage Vitals  Enc Vitals Group     BP 01/12/20 1422 127/70     Pulse Rate 01/12/20 1422 93     Resp 01/12/20 1422 (!) 27     Temp 01/12/20 1422 97.7 F (36.5 C)     Temp Source 01/12/20 1422 Temporal     SpO2 01/12/20 1422 98 %     Weight --      Height --      Head Circumference --      Peak Flow --      Pain Score 01/12/20 1420 0     Pain Loc --      Pain Edu? --      Excl. in [place]? --    No data found.  Updated Vital Signs BP 127/70 (BP Location: Right Arm)   Pulse 93   Temp 97.7 F (36.5 C) (Temporal)   Resp (!) 27   LMP  (LMP Unknown)   SpO2 98%   Visual Acuity Right Eye Distance:   Left Eye Distance:   Bilateral Distance:    Right Eye Near:   Left Eye Near:    Bilateral Near:     Physical Exam Vitals and nursing note reviewed.  Constitutional:      Appearance: Normal appearance. She is not ill-appearing.  HENT:     Head: Atraumatic.     Right Ear: Tympanic membrane normal.     Left Ear: Tympanic membrane normal.  Eyes:     Extraocular Movements: Extraocular movements intact.     Conjunctiva/sclera: Conjunctivae normal.  Cardiovascular:     Rate and Rhythm: Normal rate.     Pulses: Normal pulses.     Heart sounds: Normal heart sounds.  Pulmonary:     Effort: Pulmonary effort is normal. No respiratory distress.     Breath sounds: Wheezing (scattered, most noticable at bases b/l) present. No rhonchi or rales.  Abdominal:     General: Bowel sounds are normal. There is no distension.     Palpations: Abdomen is soft.     Tenderness: There is no  abdominal tenderness. There is no guarding.  Musculoskeletal:  General: No swelling.     Cervical back: Normal range of motion and neck supple.     Comments: ROM at baseline  Skin:    General: Skin is warm and dry.     Findings: No rash.  Neurological:     Mental Status: She is alert and oriented to person, place, and time. Mental status is at baseline.  Psychiatric:        Mood and Affect: Mood normal.        Thought Content: Thought content normal.        Judgment: Judgment normal.      UC Treatments / Results  Labs (all labs ordered are listed, but only abnormal results are displayed) Labs Reviewed - No data to display  EKG   Radiology No results found.  Procedures Procedures (including critical care time)  Medications Ordered in UC Medications  albuterol (VENTOLIN HFA) 108 (90 Base) MCG/ACT inhaler 2 puff (2 puffs Inhalation Given 01/12/20 1556)    Initial Impression / Assessment and Plan / UC Course  I have reviewed the triage vital signs and the nursing notes.  Pertinent labs & imaging results that were available during my care of the patient were reviewed by me and considered in my medical decision making (see chart for details).     CXR showing small effusion and potential infiltrate, will cover with doxycycline and albuterol inhaler given in clinic and to take home for wheezing. Discussed plain mucinex, delsym as needed. Strict ED precautions given if sxs worsening but hemodynamically stable with O2 saturation of 98% at this time. Discussed close PCP follow up first thing next week additionally to recheck lungs and discuss x-ray results  Final Clinical Impressions(s) / UC Diagnoses   Final diagnoses:  Cough  Wheezing     Discharge Instructions     Follow up next week with your Primary Care for re-check    ED Prescriptions    Medication Sig Dispense Auth. Provider   doxycycline (VIBRA-TABS) 100 MG tablet Take 1 tablet (100 mg total) by mouth  2 (two) times daily. 14 tablet Volney American, [place]     PDMP not reviewed this encounter.   Volney American, [place] 01/15/20 0710

## 2020-01-15 NOTE — Progress Notes (Signed)
I agree with the above plan 

## 2020-01-16 DIAGNOSIS — N390 Urinary tract infection, site not specified: Secondary | ICD-10-CM | POA: Diagnosis not present

## 2020-01-16 DIAGNOSIS — B952 Enterococcus as the cause of diseases classified elsewhere: Secondary | ICD-10-CM | POA: Diagnosis not present

## 2020-01-16 DIAGNOSIS — I69354 Hemiplegia and hemiparesis following cerebral infarction affecting left non-dominant side: Secondary | ICD-10-CM | POA: Diagnosis not present

## 2020-01-16 DIAGNOSIS — I10 Essential (primary) hypertension: Secondary | ICD-10-CM | POA: Diagnosis not present

## 2020-01-16 DIAGNOSIS — J189 Pneumonia, unspecified organism: Secondary | ICD-10-CM | POA: Diagnosis not present

## 2020-01-16 DIAGNOSIS — I69312 Visuospatial deficit and spatial neglect following cerebral infarction: Secondary | ICD-10-CM | POA: Diagnosis not present

## 2020-01-16 DIAGNOSIS — I69318 Other symptoms and signs involving cognitive functions following cerebral infarction: Secondary | ICD-10-CM | POA: Diagnosis not present

## 2020-01-16 DIAGNOSIS — J9601 Acute respiratory failure with hypoxia: Secondary | ICD-10-CM | POA: Diagnosis not present

## 2020-01-16 DIAGNOSIS — B961 Klebsiella pneumoniae [K. pneumoniae] as the cause of diseases classified elsewhere: Secondary | ICD-10-CM | POA: Diagnosis not present

## 2020-01-16 DIAGNOSIS — I69398 Other sequelae of cerebral infarction: Secondary | ICD-10-CM | POA: Diagnosis not present

## 2020-01-16 DIAGNOSIS — R441 Visual hallucinations: Secondary | ICD-10-CM | POA: Diagnosis not present

## 2020-01-19 ENCOUNTER — Inpatient Hospital Stay (HOSPITAL_COMMUNITY)
Admission: EM | Admit: 2020-01-19 | Discharge: 2020-01-25 | DRG: 871 | Disposition: A | Discharge status: Home Health | Payer: Medicare Other | Attending: Internal Medicine | Admitting: Internal Medicine

## 2020-01-19 ENCOUNTER — Other Ambulatory Visit: Payer: Self-pay

## 2020-01-19 ENCOUNTER — Emergency Department (HOSPITAL_COMMUNITY): Payer: Medicare Other

## 2020-01-19 DIAGNOSIS — R6520 Severe sepsis without septic shock: Secondary | ICD-10-CM | POA: Diagnosis present

## 2020-01-19 DIAGNOSIS — Z8673 Personal history of transient ischemic attack (TIA), and cerebral infarction without residual deficits: Secondary | ICD-10-CM

## 2020-01-19 DIAGNOSIS — Z91030 Bee allergy status: Secondary | ICD-10-CM

## 2020-01-19 DIAGNOSIS — R0902 Hypoxemia: Secondary | ICD-10-CM | POA: Diagnosis not present

## 2020-01-19 DIAGNOSIS — A419 Sepsis, unspecified organism: Secondary | ICD-10-CM | POA: Diagnosis not present

## 2020-01-19 DIAGNOSIS — I4891 Unspecified atrial fibrillation: Secondary | ICD-10-CM | POA: Diagnosis present

## 2020-01-19 DIAGNOSIS — I482 Chronic atrial fibrillation, unspecified: Secondary | ICD-10-CM | POA: Diagnosis not present

## 2020-01-19 DIAGNOSIS — Z7901 Long term (current) use of anticoagulants: Secondary | ICD-10-CM

## 2020-01-19 DIAGNOSIS — R0689 Other abnormalities of breathing: Secondary | ICD-10-CM | POA: Diagnosis not present

## 2020-01-19 DIAGNOSIS — R000 Tachycardia, unspecified: Secondary | ICD-10-CM | POA: Diagnosis not present

## 2020-01-19 DIAGNOSIS — J439 Emphysema, unspecified: Secondary | ICD-10-CM | POA: Diagnosis present

## 2020-01-19 DIAGNOSIS — Z87891 Personal history of nicotine dependence: Secondary | ICD-10-CM

## 2020-01-19 DIAGNOSIS — Z20822 Contact with and (suspected) exposure to covid-19: Secondary | ICD-10-CM | POA: Diagnosis not present

## 2020-01-19 DIAGNOSIS — J9601 Acute respiratory failure with hypoxia: Secondary | ICD-10-CM | POA: Diagnosis present

## 2020-01-19 DIAGNOSIS — Z882 Allergy status to sulfonamides status: Secondary | ICD-10-CM

## 2020-01-19 DIAGNOSIS — Z8249 Family history of ischemic heart disease and other diseases of the circulatory system: Secondary | ICD-10-CM

## 2020-01-19 DIAGNOSIS — Z881 Allergy status to other antibiotic agents status: Secondary | ICD-10-CM

## 2020-01-19 DIAGNOSIS — Z888 Allergy status to other drugs, medicaments and biological substances status: Secondary | ICD-10-CM

## 2020-01-19 DIAGNOSIS — I480 Paroxysmal atrial fibrillation: Secondary | ICD-10-CM | POA: Diagnosis present

## 2020-01-19 DIAGNOSIS — Y92239 Unspecified place in hospital as the place of occurrence of the external cause: Secondary | ICD-10-CM | POA: Diagnosis not present

## 2020-01-19 DIAGNOSIS — J189 Pneumonia, unspecified organism: Secondary | ICD-10-CM | POA: Diagnosis present

## 2020-01-19 DIAGNOSIS — D72829 Elevated white blood cell count, unspecified: Secondary | ICD-10-CM | POA: Diagnosis not present

## 2020-01-19 DIAGNOSIS — J90 Pleural effusion, not elsewhere classified: Secondary | ICD-10-CM | POA: Diagnosis not present

## 2020-01-19 DIAGNOSIS — R531 Weakness: Secondary | ICD-10-CM | POA: Diagnosis not present

## 2020-01-19 DIAGNOSIS — B952 Enterococcus as the cause of diseases classified elsewhere: Secondary | ICD-10-CM | POA: Diagnosis present

## 2020-01-19 DIAGNOSIS — Z800 Family history of malignant neoplasm of digestive organs: Secondary | ICD-10-CM

## 2020-01-19 DIAGNOSIS — R030 Elevated blood-pressure reading, without diagnosis of hypertension: Secondary | ICD-10-CM | POA: Diagnosis present

## 2020-01-19 DIAGNOSIS — Z79899 Other long term (current) drug therapy: Secondary | ICD-10-CM

## 2020-01-19 DIAGNOSIS — T380X5A Adverse effect of glucocorticoids and synthetic analogues, initial encounter: Secondary | ICD-10-CM | POA: Diagnosis not present

## 2020-01-19 DIAGNOSIS — Z87440 Personal history of urinary (tract) infections: Secondary | ICD-10-CM

## 2020-01-19 DIAGNOSIS — Z1624 Resistance to multiple antibiotics: Secondary | ICD-10-CM | POA: Diagnosis present

## 2020-01-19 MED ORDER — LACTATED RINGERS IV BOLUS (SEPSIS)
1250.0000 mL | Freq: Once | INTRAVENOUS | Status: AC
  Administered 2020-01-20: 1250 mL via INTRAVENOUS

## 2020-01-19 MED ORDER — LACTATED RINGERS IV SOLN: INTRAVENOUS | Status: AC

## 2020-01-19 NOTE — ED Provider Notes (Signed)
[facility] EMERGENCY DEPARTMENT Provider Note   CSN: [id number] Arrival date & time: 01/19/20  2343     History Chief Complaint  Patient presents with  . Weakness    [name] is a 86 y.o. female.  HPI   Patient is an 86 year old female with a complicated medical history noted below.  Patient was brought in via EMS for worsening generalized weakness.  Patient was recently diagnosed with pneumonia as well as CVA.  She has been living at home with her daughter.  Patient feels that since her discharge she has been experiencing worsening generalized weakness.  Reports associated shortness of breath.  No chest pain.  No abdominal pain.  Pt states "I just feel tired and I don't know why". Episode of vomiting with EMS earlier but denies any current nausea.  EMS found patient to be febrile, tachycardic, in A. fib.  O2 sats in the 80s.  Placed on 2 L via nasal cannula with saturations improving to the high 90s.  Patient given 500 cc of IV fluids in route.  Code sepsis initiated. Vaccinated for COVID x 3.      Past Medical History:  Diagnosis Date  . Atrial fibrillation ([place])    Prior treatment with amiodarone, held sinus rhythm and drug stopped  . Atrial flutter ([place])    Status post ablation prior to  2009  . Chest pain    Nuclear, 2004, no scar or ischemia, EF 77%  . Ejection fraction    EF 70%, nuclear,  2004  //     . Fall    July, 2014  . H/O bladder repair surgery   . Right Achilles tendinitis   . Sciatica     Patient Active Problem List   Diagnosis Date Noted  . Pseudoaneurysm of femoral artery following procedure ([place]) 11/23/2019  . CAP (community acquired pneumonia) 11/23/2019  . Essential hypertension 11/23/2019  . Hyperlipidemia LDL goal <70 11/23/2019  . Emphysema of lung ([place]) 11/23/2019  . Aortic atherosclerosis ([place]) 11/23/2019  . Acute blood loss anemia 11/23/2019  . Acute ischemic stroke ([place])   . Pseudoaneurysm ([place])   . Slow transit  constipation   . Chronic UTI   . Acute ischemic right PCA stroke ([place]) s/p tPA and mechanical thrombectomy, d/t AF not on [facility] 11/14/2019  . Stroke ([place]) 11/13/2019  . Hymenoptera allergy 10/05/2014  . SVT (supraventricular tachycardia) ([place]) 10/20/2012  . Fall   . Chest pain   . Ejection fraction   . Atrial fibrillation ([place])   . Atrial flutter [facility])     Past Surgical History:  Procedure Laterality Date  . IR CT HEAD LTD  11/14/2019  . IR PERCUTANEOUS ART THROMBECTOMY/INFUSION INTRACRANIAL INC DIAG ANGIO  11/14/2019  . IR US GUIDE VASC ACCESS RIGHT  11/14/2019  . no surgical hx    . RADIOLOGY WITH ANESTHESIA N/A 11/13/2019   Procedure: IR WITH ANESTHESIA;  Surgeon: Luanne Bras, MD;  Location: [place];  Service: Radiology;  Laterality: N/A;     OB History   No obstetric history on file.     Family History  Problem Relation Age of Onset  . Cancer Mother   . Cancer Father        pancreatic cancer  . Heart disease Brother     Social History   Tobacco Use  . Smoking status: Former Research scientist (life sciences)  . Smokeless tobacco: Never Used  Vaping Use  . Vaping Use: Never used  Substance Use Topics  .  Alcohol use: Yes  . Drug use: Never    Home Medications Prior to Admission medications   Medication Sig Start Date End Date Taking? Authorizing Provider  atorvastatin (LIPITOR) 10 MG tablet Take 10 mg by mouth daily.    [provider]  Cholecalciferol (VITAMIN D3) 50 MCG (2000 UT) CAPS Take 2,000 Units by mouth daily.    [provider]  Cyanocobalamin (VITAMIN B12 PO) Take by mouth.    [provider]  Diclofenac Sodium 2 % SOLN [place]aid 20 mg/gram/actuation (2 %) topical soln in metered-dose pump  APPLY 2 PUMPS (40 MG) TO THE AFFECTED AREA BY TOPICAL ROUTE 2 TIMES PER DAY    [provider]  doxycycline (VIBRA-TABS) 100 MG tablet Take 1 tablet (100 mg total) by mouth 2 (two) times daily. 01/12/20   Volney American, PA-C  ELIQUIS 5 MG TABS  tablet TAKE 1 TABLET BY MOUTH TWICE DAILY. 01/07/20   Jerline Pain, MD  mirtazapine (REMERON) 15 MG tablet Take 15 mg by mouth at bedtime.    [provider]  nitrofurantoin (MACRODANTIN) 100 MG capsule Take 100 mg by mouth at bedtime.    [provider]    Allergies    Bee venom, Mixed vespid venom, Codeine, Sulfa antibiotics, and Erythromycin  Review of Systems   Review of Systems  All other systems reviewed and are negative. Ten systems reviewed and are negative for acute change, except as noted in the HPI.    Physical Exam Updated Vital Signs BP (!) 144/77   Pulse 94   Temp 99.4 F (37.4 C) (Oral)   Resp 15   LMP  (LMP Unknown)   SpO2 97%   Physical Exam Vitals and nursing note reviewed.  Constitutional:      General: She is in acute distress.     Appearance: Normal appearance. She is not ill-appearing, toxic-appearing or diaphoretic.  HENT:     Head: Normocephalic and atraumatic.     Right Ear: External ear normal.     Left Ear: External ear normal.     Nose: Nose normal.     Mouth/Throat:     Mouth: Mucous membranes are moist.     Pharynx: Oropharynx is clear. No oropharyngeal exudate or posterior oropharyngeal erythema.  Eyes:     General: No scleral icterus.       Right eye: No discharge.        Left eye: No discharge.     Extraocular Movements: Extraocular movements intact.     Conjunctiva/sclera: Conjunctivae normal.  Cardiovascular:     Rate and Rhythm: Regular rhythm. Tachycardia present.     Pulses: Normal pulses.     Heart sounds: Normal heart sounds. No murmur heard. No friction rub. No gallop.   Pulmonary:     Effort: Pulmonary effort is normal. No respiratory distress.     Breath sounds: Normal breath sounds. No stridor. No wheezing, rhonchi or rales.  Abdominal:     General: Abdomen is flat.     Tenderness: There is no abdominal tenderness.  Musculoskeletal:        General: Normal range of motion.     Cervical back: Normal  range of motion and neck supple. No tenderness.  Skin:    General: Skin is warm and dry.  Neurological:     General: No focal deficit present.     Mental Status: She is alert and oriented to person, place, and time.  Psychiatric:  Mood and Affect: Mood normal.        Behavior: Behavior normal.    ED Results / Procedures / Treatments   Labs (all labs ordered are listed, but only abnormal results are displayed) Labs Reviewed  LACTIC ACID, PLASMA - Abnormal; Notable for the following components:      Result Value   Lactic Acid, Venous 2.2 (*)    All other components within normal limits  COMPREHENSIVE METABOLIC PANEL - Abnormal; Notable for the following components:   CO2 21 (*)    Glucose, Bld 122 (*)    Calcium 8.5 (*)    Total Protein 6.2 (*)    Albumin 2.9 (*)    All other components within normal limits  CBC WITH DIFFERENTIAL/PLATELET - Abnormal; Notable for the following components:   WBC 11.6 (*)    Hemoglobin 11.5 (*)    Neutro Abs 10.0 (*)    All other components within normal limits  PROTIME-INR - Abnormal; Notable for the following components:   Prothrombin Time 16.5 (*)    INR 1.4 (*)    All other components within normal limits  RESP PANEL BY RT-PCR (FLU A&B, COVID) ARPGX2  CULTURE, BLOOD (SINGLE)  URINE CULTURE  APTT  LACTIC ACID, PLASMA  URINALYSIS, ROUTINE W REFLEX MICROSCOPIC  BRAIN NATRIURETIC PEPTIDE  PROCALCITONIN   EKG EKG Interpretation  Date/Time:  Saturday January 19 2020 23:53:55 EST Ventricular Rate:  101 PR Interval:    QRS Duration: 80 QT Interval:  334 QTC Calculation: 433 R Axis:   77 Text Interpretation: Atrial fibrillation Ventricular premature complex Low voltage, precordial leads Borderline T abnormalities, anterior leads No significant change since last tracing Confirmed by Merrily Pew [phone]) on 01/20/2020 2:26:03 AM  Radiology DG Chest Port 1 View  Result Date: 01/20/2020 CLINICAL DATA:  Possible sepsis.  Weakness.  EXAM: PORTABLE CHEST 1 VIEW COMPARISON:  01/12/2020 FINDINGS: The cardiac silhouette is mildly enlarged. Aortic atherosclerosis is noted. Mild diffuse interstitial densities in both lungs as well as reticulonodular densities in the right greater than left lung apices have not significantly changed. There is likely a persistent trace right pleural effusion. No sizable left pleural effusion is evident. No pneumothorax is identified. No acute osseous abnormality is seen. IMPRESSION: Unchanged bilateral lung opacities which could reflect infection or edema. Persistent trace right pleural effusion. Electronically Signed   By:  Bores M.D.   On: 01/20/2020 01:21   Procedures Procedures (including critical care time)  Medications Ordered in ED Medications  lactated ringers infusion ( Intravenous New Bag/Given 01/20/20 0133)  lactated ringers bolus 1,250 mL (0 mLs Intravenous Stopped 01/20/20 0107)  cefTRIAXone (ROCEPHIN) 1 g in sodium chloride 0.9 % 100 mL IVPB (0 g Intravenous Stopped 01/20/20 0130)  azithromycin (ZITHROMAX) 500 mg in sodium chloride 0.9 % 250 mL IVPB (500 mg Intravenous New Bag/Given 01/20/20 0103)   ED Course  I have reviewed the triage vital signs and the nursing notes.  Pertinent labs & imaging results that were available during my care of the patient were reviewed by me and considered in my medical decision making (see chart for details).  Clinical Course as of 01/20/20 0239  Sun Jan 20, 2020  0026 WBC(!): 11.6 [LJ]  0026 NEUT#(!): 10.0 [LJ]  0129 DG Chest Port 1 View IMPRESSION: Unchanged bilateral lung opacities which could reflect infection or edema. Persistent trace right pleural effusion. [LJ]  0129 SARS Coronavirus 2 by RT PCR: NEGATIVE [LJ]  0215 Temp: 99.4 F (37.4 C) [LJ]  Clinical Course User Index [LJ] Moody Bruins   MDM Rules/Calculators/A&P                          Patient is an 86 year old female who presents the emergency department  due to weakness and shortness of breath.  Patient was seen in urgent care on December 18.  Chest x-ray obtained showing small effusion as well as potential infiltrate.  She was started on doxycycline.  She has been taking this without relief.  States she has just been feeling worsening weakness.  EMS was called today and patient met sepsis protocol.  Given 500 cc of IV fluids by EMS.  Patient given remaining fluid in the ED for a 30 cc/kg bolus.  Obtained a sepsis work-up.  Started on antibiotics for sepsis secondary to likely immunity acquired pneumonia.  Fever improved to 99.4 Fahrenheit.  Tachycardia improved to 86 bpm.  COVID-19 test is negative.  CBC showing leukocytosis of 11.6.  Electrolytes within normal limits on CMP.  Elevated lactic acid at 2.2.  Will admit patient to the hospitalist team at this time.  Final Clinical Impression(s) / ED Diagnoses Final diagnoses:  Sepsis, due to unspecified organism, unspecified whether acute organ dysfunction present [facility])  Community acquired pneumonia, unspecified laterality    Rx / DC Orders ED Discharge Orders    None       Rayna Sexton, PA-C 01/20/20 0239    Merrily Pew, MD 01/20/20 [phone]

## 2020-01-19 NOTE — ED Triage Notes (Signed)
Pt from home by EMS; c/o weakness; recently finixhed abx regimen for pnx.  Pt c/o not feeling well for one month; today was febrile with elevated HR; rhythm is Afib with hx of same.  Pt received 500 ml NS bolus; normally on RA; O2 sat in 80s; up to 90s on [place] 2L.  Pt A&O on arrival.

## 2020-01-20 DIAGNOSIS — Z20822 Contact with and (suspected) exposure to covid-19: Secondary | ICD-10-CM | POA: Diagnosis present

## 2020-01-20 DIAGNOSIS — Y92239 Unspecified place in hospital as the place of occurrence of the external cause: Secondary | ICD-10-CM | POA: Diagnosis not present

## 2020-01-20 DIAGNOSIS — J189 Pneumonia, unspecified organism: Secondary | ICD-10-CM | POA: Diagnosis present

## 2020-01-20 DIAGNOSIS — T380X5A Adverse effect of glucocorticoids and synthetic analogues, initial encounter: Secondary | ICD-10-CM | POA: Diagnosis not present

## 2020-01-20 DIAGNOSIS — R030 Elevated blood-pressure reading, without diagnosis of hypertension: Secondary | ICD-10-CM | POA: Diagnosis present

## 2020-01-20 DIAGNOSIS — Z79899 Other long term (current) drug therapy: Secondary | ICD-10-CM | POA: Diagnosis not present

## 2020-01-20 DIAGNOSIS — Z881 Allergy status to other antibiotic agents status: Secondary | ICD-10-CM | POA: Diagnosis not present

## 2020-01-20 DIAGNOSIS — Z888 Allergy status to other drugs, medicaments and biological substances status: Secondary | ICD-10-CM | POA: Diagnosis not present

## 2020-01-20 DIAGNOSIS — Z87891 Personal history of nicotine dependence: Secondary | ICD-10-CM | POA: Diagnosis not present

## 2020-01-20 DIAGNOSIS — J90 Pleural effusion, not elsewhere classified: Secondary | ICD-10-CM | POA: Diagnosis present

## 2020-01-20 DIAGNOSIS — Z7901 Long term (current) use of anticoagulants: Secondary | ICD-10-CM | POA: Diagnosis not present

## 2020-01-20 DIAGNOSIS — Z1624 Resistance to multiple antibiotics: Secondary | ICD-10-CM | POA: Diagnosis present

## 2020-01-20 DIAGNOSIS — Z882 Allergy status to sulfonamides status: Secondary | ICD-10-CM | POA: Diagnosis not present

## 2020-01-20 DIAGNOSIS — Z800 Family history of malignant neoplasm of digestive organs: Secondary | ICD-10-CM | POA: Diagnosis not present

## 2020-01-20 DIAGNOSIS — D72829 Elevated white blood cell count, unspecified: Secondary | ICD-10-CM | POA: Diagnosis not present

## 2020-01-20 DIAGNOSIS — R918 Other nonspecific abnormal finding of lung field: Secondary | ICD-10-CM | POA: Diagnosis not present

## 2020-01-20 DIAGNOSIS — J9601 Acute respiratory failure with hypoxia: Secondary | ICD-10-CM

## 2020-01-20 DIAGNOSIS — J439 Emphysema, unspecified: Secondary | ICD-10-CM | POA: Diagnosis present

## 2020-01-20 DIAGNOSIS — R531 Weakness: Secondary | ICD-10-CM | POA: Diagnosis not present

## 2020-01-20 DIAGNOSIS — B952 Enterococcus as the cause of diseases classified elsewhere: Secondary | ICD-10-CM | POA: Diagnosis present

## 2020-01-20 DIAGNOSIS — A419 Sepsis, unspecified organism: Secondary | ICD-10-CM | POA: Diagnosis present

## 2020-01-20 DIAGNOSIS — Z87440 Personal history of urinary (tract) infections: Secondary | ICD-10-CM | POA: Diagnosis not present

## 2020-01-20 DIAGNOSIS — Z8249 Family history of ischemic heart disease and other diseases of the circulatory system: Secondary | ICD-10-CM | POA: Diagnosis not present

## 2020-01-20 DIAGNOSIS — Z91030 Bee allergy status: Secondary | ICD-10-CM | POA: Diagnosis not present

## 2020-01-20 DIAGNOSIS — J181 Lobar pneumonia, unspecified organism: Secondary | ICD-10-CM | POA: Diagnosis not present

## 2020-01-20 DIAGNOSIS — I480 Paroxysmal atrial fibrillation: Secondary | ICD-10-CM | POA: Diagnosis not present

## 2020-01-20 DIAGNOSIS — I482 Chronic atrial fibrillation, unspecified: Secondary | ICD-10-CM | POA: Diagnosis present

## 2020-01-20 DIAGNOSIS — R6520 Severe sepsis without septic shock: Secondary | ICD-10-CM | POA: Diagnosis present

## 2020-01-20 DIAGNOSIS — Z8673 Personal history of transient ischemic attack (TIA), and cerebral infarction without residual deficits: Secondary | ICD-10-CM | POA: Diagnosis not present

## 2020-01-20 LAB — APTT: aPTT: 32 seconds (ref 24–36)

## 2020-01-20 LAB — CBC
HCT: 34.7 % — ABNORMAL LOW (ref 36.0–46.0)
Hemoglobin: 10.7 g/dL — ABNORMAL LOW (ref 12.0–15.0)
MCH: 28.6 pg (ref 26.0–34.0)
MCHC: 30.8 g/dL (ref 30.0–36.0)
MCV: 92.8 fL (ref 80.0–100.0)
Platelets: 306 10*3/uL (ref 150–400)
RBC: 3.74 MIL/uL — ABNORMAL LOW (ref 3.87–5.11)
RDW: 14.3 % (ref 11.5–15.5)
WBC: 13.1 10*3/uL — ABNORMAL HIGH (ref 4.0–10.5)
nRBC: 0 % (ref 0.0–0.2)

## 2020-01-20 LAB — COMPREHENSIVE METABOLIC PANEL
ALT: 25 U/L (ref 0–44)
ALT: 22 U/L (ref 0–44)
AST: 29 U/L (ref 15–41)
AST: 28 U/L (ref 15–41)
Albumin: 2.9 g/dL — ABNORMAL LOW (ref 3.5–5.0)
Albumin: 2.5 g/dL — ABNORMAL LOW (ref 3.5–5.0)
Alkaline Phosphatase: 69 U/L (ref 38–126)
Alkaline Phosphatase: 58 U/L (ref 38–126)
Anion gap: 12 (ref 5–15)
Anion gap: 9 (ref 5–15)
BUN: 14 mg/dL (ref 8–23)
BUN: 11 mg/dL (ref 8–23)
CO2: 21 mmol/L — ABNORMAL LOW (ref 22–32)
CO2: 23 mmol/L (ref 22–32)
Calcium: 8.5 mg/dL — ABNORMAL LOW (ref 8.9–10.3)
Calcium: 8.1 mg/dL — ABNORMAL LOW (ref 8.9–10.3)
Chloride: 109 mmol/L (ref 98–111)
Chloride: 107 mmol/L (ref 98–111)
Creatinine, Ser: 0.86 mg/dL (ref 0.44–1.00)
Creatinine, Ser: 0.77 mg/dL (ref 0.44–1.00)
GFR, Estimated: >60 mL/min (ref >60)
GFR, Estimated: >60 mL/min (ref >60)
Glucose, Bld: 122 mg/dL — ABNORMAL HIGH (ref 70–99)
Glucose, Bld: 119 mg/dL — ABNORMAL HIGH (ref 70–99)
Potassium: 3.9 mmol/L (ref 3.5–5.1)
Potassium: 4.1 mmol/L (ref 3.5–5.1)
Sodium: 142 mmol/L (ref 135–145)
Sodium: 139 mmol/L (ref 135–145)
Total Bilirubin: 0.9 mg/dL (ref 0.3–1.2)
Total Bilirubin: 0.9 mg/dL (ref 0.3–1.2)
Total Protein: 6.2 g/dL — ABNORMAL LOW (ref 6.5–8.1)
Total Protein: 5.3 g/dL — ABNORMAL LOW (ref 6.5–8.1)

## 2020-01-20 LAB — CBC WITH DIFFERENTIAL/PLATELET
Abs Immature Granulocytes: 0.04 10*3/uL (ref 0.00–0.07)
Basophils Absolute: 0 10*3/uL (ref 0.0–0.1)
Basophils Relative: 0 %
Eosinophils Absolute: 0.1 10*3/uL (ref 0.0–0.5)
Eosinophils Relative: 1 %
HCT: 37.7 % (ref 36.0–46.0)
Hemoglobin: 11.5 g/dL — ABNORMAL LOW (ref 12.0–15.0)
Immature Granulocytes: 0 %
Lymphocytes Relative: 8 %
Lymphs Abs: 1 10*3/uL (ref 0.7–4.0)
MCH: 28.6 pg (ref 26.0–34.0)
MCHC: 30.5 g/dL (ref 30.0–36.0)
MCV: 93.8 fL (ref 80.0–100.0)
Monocytes Absolute: 0.5 10*3/uL (ref 0.1–1.0)
Monocytes Relative: 4 %
Neutro Abs: 10 10*3/uL — ABNORMAL HIGH (ref 1.7–7.7)
Neutrophils Relative %: 87 %
Platelets: 305 10*3/uL (ref 150–400)
RBC: 4.02 MIL/uL (ref 3.87–5.11)
RDW: 14.2 % (ref 11.5–15.5)
WBC: 11.6 10*3/uL — ABNORMAL HIGH (ref 4.0–10.5)
nRBC: 0 % (ref 0.0–0.2)

## 2020-01-20 LAB — RESP PANEL BY RT-PCR (FLU A&B, COVID) ARPGX2
Influenza A by PCR: NEGATIVE
Influenza B by PCR: NEGATIVE
SARS Coronavirus 2 by RT PCR: NEGATIVE

## 2020-01-20 LAB — BRAIN NATRIURETIC PEPTIDE: B Natriuretic Peptide: 257 pg/mL — ABNORMAL HIGH (ref 0.0–100.0)

## 2020-01-20 LAB — PROCALCITONIN: Procalcitonin: 0.58 ng/mL

## 2020-01-20 LAB — HIV ANTIBODY (ROUTINE TESTING W REFLEX): HIV Screen 4th Generation wRfx: NONREACTIVE

## 2020-01-20 LAB — LACTIC ACID, PLASMA
Lactic Acid, Venous: 2.2 mmol/L (ref 0.5–1.9)
Lactic Acid, Venous: 1.6 mmol/L (ref 0.5–1.9)

## 2020-01-20 LAB — PROTIME-INR
INR: 1.4 — ABNORMAL HIGH (ref 0.8–1.2)
Prothrombin Time: 16.5 seconds — ABNORMAL HIGH (ref 11.4–15.2)

## 2020-01-20 MED ORDER — SODIUM CHLORIDE 0.9 % IV SOLN
2.0000 g | INTRAVENOUS | Status: DC
  Administered 2020-01-20 – 2020-01-23 (×4): 2 g via INTRAVENOUS
  Filled 2020-01-20: qty 2
  Filled 2020-01-20 (×2): qty 20
  Filled 2020-01-20 (×2): qty 2

## 2020-01-20 MED ORDER — APIXABAN 5 MG PO TABS
5.0000 mg | ORAL_TABLET | Freq: Two times a day (BID) | ORAL | Status: DC
  Administered 2020-01-20 – 2020-01-25 (×11): 5 mg via ORAL
  Filled 2020-01-20 (×12): qty 1

## 2020-01-20 MED ORDER — VITAMIN D 25 MCG (1000 UNIT) PO TABS
2000.0000 [IU] | ORAL_TABLET | Freq: Every day | ORAL | Status: DC
  Administered 2020-01-20 – 2020-01-24 (×5): 2000 [IU] via ORAL
  Filled 2020-01-20 (×5): qty 2

## 2020-01-20 MED ORDER — SODIUM CHLORIDE 0.9 % IV SOLN
500.0000 mg | Freq: Once | INTRAVENOUS | Status: AC
  Administered 2020-01-20: 500 mg via INTRAVENOUS
  Filled 2020-01-20: qty 500

## 2020-01-20 MED ORDER — IPRATROPIUM-ALBUTEROL 0.5-2.5 (3) MG/3ML IN SOLN
3.0000 mL | Freq: Four times a day (QID) | RESPIRATORY_TRACT | Status: DC
  Filled 2020-01-20: qty 3

## 2020-01-20 MED ORDER — IPRATROPIUM-ALBUTEROL 0.5-2.5 (3) MG/3ML IN SOLN: 3.0000 mL | Freq: Four times a day (QID) | RESPIRATORY_TRACT | Status: DC | PRN

## 2020-01-20 MED ORDER — SODIUM CHLORIDE 0.9 % IV SOLN
500.0000 mg | INTRAVENOUS | Status: DC
Start: 2020-01-20 — End: 2020-01-22
  Administered 2020-01-20: 500 mg via INTRAVENOUS
  Filled 2020-01-20 (×3): qty 500

## 2020-01-20 MED ORDER — SODIUM CHLORIDE 0.9 % IV SOLN
1.0000 g | Freq: Once | INTRAVENOUS | Status: AC
  Administered 2020-01-20: 1 g via INTRAVENOUS
  Filled 2020-01-20: qty 10

## 2020-01-20 MED ORDER — ATORVASTATIN CALCIUM 10 MG PO TABS
10.0000 mg | ORAL_TABLET | Freq: Every day | ORAL | Status: DC
  Administered 2020-01-20 – 2020-01-24 (×5): 10 mg via ORAL
  Filled 2020-01-20 (×5): qty 1

## 2020-01-20 MED ORDER — METHYLPREDNISOLONE SODIUM SUCC 125 MG IJ SOLR
60.0000 mg | INTRAMUSCULAR | Status: DC
  Administered 2020-01-20 – 2020-01-23 (×4): 60 mg via INTRAVENOUS
  Filled 2020-01-20 (×4): qty 2

## 2020-01-20 MED ORDER — MIRTAZAPINE 15 MG PO TABS
15.0000 mg | ORAL_TABLET | Freq: Every day | ORAL | Status: DC
  Administered 2020-01-20 – 2020-01-24 (×5): 15 mg via ORAL
  Filled 2020-01-20 (×6): qty 1

## 2020-01-20 NOTE — Progress Notes (Signed)
PROGRESS NOTE    [name]  TDD:[id number] DOB: March 31, 1931 DOA: 01/19/2020 PCP: Crist Infante, MD     Brief Narrative:  86 y.o. WF PMHx A.Fib on Eliquis following a stroke early Nov, recurrent UTIs on macrodantin.  Worth noting that UA grew out K.Pnemo that was resistant to nitrofurantoin on Nov 5th.  Patient presents to the ED with c/o illness for the past 1 month.  Fever at home.  Generalized weakness.  Recent diagnosis of PNA on 12/18, put on doxycycline.  Took doxycycline without improvement in symptoms.  No abd pain, no vomiting, does have nausea.   ED Course: Satting 80s on RA, improved on 2L via [place].  Is COVID vaccinated.  CXR similar to last time = bibasilar infiltrates.  Tm 101.4, HR 107, WBC 11.6k, lactate 2.2.  COVID and flu neg.  Started on rocephin + azithro.   Subjective: A/O x4, positive S OB   Assessment & Plan: Covid vaccination; vaccinated   Principal Problem:   Sepsis due to pneumonia [facility]) Active Problems:   Atrial fibrillation ([place])   Acute respiratory failure with hypoxia ([place])   CAP (community acquired pneumonia)   Severe sepsis -On admission patient met criteria for severe sepsis temp> 38 C, HR> 90, WBC> 12 K, site of infection lungs, lactic acid> 2  Acute respiratory failure with hypoxia/CAP -Continue current antibiotics x7 days -DuoNeb QID -Incentive spirometry -Flutter valve -Solu-Medrol 60 mg daily -Trend procalcitonin/lactic acid -Lactated Ringer's 145m/hr  A. fib -Currently rate controlled without medication -Continue Eliquis     DVT prophylaxis: Eliquis Code Status: Full Family Communication: 12/26 daughter at bedside for discussion of plan of care answered all questions Status is: Inpatient    Dispo: The patient is from: Home              Anticipated d/c is to: Home              Anticipated d/c date is: 12/30              Patient currently unstable      Consultants:     Procedures/Significant Events:    I have personally reviewed and interpreted all radiology studies and my findings are as above.  VENTILATOR SETTINGS: Nasal cannula 12/26 Flow 4 L/min SPO2; 97%   Cultures 12/26 SARS coronavirus negative 12/26 influenza A/B negative 12/26 blood pending 12/26 sputum pending  Antimicrobials: Anti-infectives (From admission, onward)   Start     Ordered Stop   01/20/20 2200  cefTRIAXone (ROCEPHIN) 2 g in sodium chloride 0.9 % 100 mL IVPB        01/20/20 0245 01/25/20 2159   01/20/20 2200  azithromycin (ZITHROMAX) 500 mg in sodium chloride 0.9 % 250 mL IVPB        01/20/20 0245 01/25/20 2159   01/20/20 0030  cefTRIAXone (ROCEPHIN) 1 g in sodium chloride 0.9 % 100 mL IVPB        01/20/20 0026 01/20/20 0130   01/20/20 0030  azithromycin (ZITHROMAX) 500 mg in sodium chloride 0.9 % 250 mL IVPB        01/20/20 0026 01/20/20 0203       Devices    LINES / TUBES:      Continuous Infusions: . azithromycin    . cefTRIAXone (ROCEPHIN)  IV    . lactated ringers 75 mL/hr at 01/20/20 0241     Objective: Vitals:   01/20/20 0845 01/20/20 0900 01/20/20 0945 01/20/20 1000  BP: (!) 114/57 (!) 115/43 (Marland Kitchen  120/58 (!) 133/58  Pulse: 73 76 78 77  Resp: (!) 22 (!) 25 (!) 21 (!) 22  Temp:      TempSrc:      SpO2: 95% 95% 99% 97%    Intake/Output Summary (Last 24 hours) at 01/20/2020 1011 Last data filed at 01/20/2020 0203 Gross per 24 hour  Intake 1593.87 ml  Output --  Net 1593.87 ml   There were no vitals filed for this visit.  Examination:  General: A/O x4, positive acute respiratory distress Eyes: negative scleral hemorrhage, negative anisocoria, negative icterus ENT: Negative Runny nose, negative gingival bleeding, Neck:  Negative scars, masses, torticollis, lymphadenopathy, JVD Lungs: tachypneic, decreased breath sounds bilaterally without wheezes or crackles Cardiovascular: Regular rate and rhythm without murmur gallop or rub  normal S1 and S2 Abdomen: negative abdominal pain, nondistended, positive soft, bowel sounds, no rebound, no ascites, no appreciable mass Extremities: No significant cyanosis, clubbing, or edema bilateral lower extremities Skin: Negative rashes, lesions, ulcers Psychiatric:  Negative depression, negative anxiety, negative fatigue, negative mania  Central nervous system:  Cranial nerves II through XII intact, tongue/uvula midline, all extremities muscle strength 5/5, sensation intact throughout, negative dysarthria, negative expressive aphasia, negative receptive aphasia.  .     Data Reviewed: Care during the described time interval was provided by me .  I have reviewed this patient's available data, including medical history, events of note, physical examination, and all test results as part of my evaluation.  CBC: Recent Labs  Lab 01/19/20 2353 01/20/20 0333  WBC 11.6* 13.1*  NEUTROABS 10.0*  --   HGB 11.5* 10.7*  HCT 37.7 34.7*  MCV 93.8 92.8  PLT 305 637   Basic Metabolic Panel: Recent Labs  Lab 01/19/20 2353 01/20/20 0333  NA 142 139  K 3.9 4.1  CL 109 107  CO2 21* 23  GLUCOSE 122* 119*  BUN 14 11  CREATININE 0.86 0.77  CALCIUM 8.5* 8.1*   GFR: CrCl cannot be calculated (Unknown ideal weight.). Liver Function Tests: Recent Labs  Lab 01/19/20 2353 01/20/20 0333  AST 29 28  ALT 25 22  ALKPHOS 69 58  BILITOT 0.9 0.9  PROT 6.2* 5.3*  ALBUMIN 2.9* 2.5*   No results for input(s): LIPASE, AMYLASE in the last 168 hours. No results for input(s): AMMONIA in the last 168 hours. Coagulation Profile: Recent Labs  Lab 01/19/20 2353  INR 1.4*   Cardiac Enzymes: No results for input(s): CKTOTAL, CKMB, CKMBINDEX, TROPONINI in the last 168 hours. BNP (last 3 results) No results for input(s): PROBNP in the last 8760 hours. HbA1C: No results for input(s): HGBA1C in the last 72 hours. CBG: No results for input(s): GLUCAP in the last 168 hours. Lipid Profile: No  results for input(s): CHOL, HDL, LDLCALC, TRIG, CHOLHDL, LDLDIRECT in the last 72 hours. Thyroid Function Tests: No results for input(s): TSH, T4TOTAL, FREET4, T3FREE, THYROIDAB in the last 72 hours. Anemia Panel: No results for input(s): VITAMINB12, FOLATE, FERRITIN, TIBC, IRON, RETICCTPCT in the last 72 hours. Sepsis Labs: Recent Labs  Lab 01/19/20 2353 01/20/20 0234 01/20/20 0332  PROCALCITON  --   --  0.58  LATICACIDVEN 2.2* 1.6  --     Recent Results (from the past 240 hour(s))  Resp Panel by RT-PCR (Flu A&B, Covid) Nasopharyngeal Swab     Status: None   Collection Time: 01/20/20 12:07 AM   Specimen: Nasopharyngeal Swab; Nasopharyngeal(NP) swabs in vial transport medium  Result Value Ref Range Status   SARS Coronavirus 2 by  RT PCR NEGATIVE NEGATIVE Final    Comment: (NOTE) SARS-CoV-2 target nucleic acids are NOT DETECTED.  The SARS-CoV-2 RNA is generally detectable in upper respiratory specimens during the acute phase of infection. The lowest concentration of SARS-CoV-2 viral copies this assay can detect is 138 copies/mL. A negative result does not preclude SARS-Cov-2 infection and should not be used as the sole basis for treatment or other patient management decisions. A negative result may occur with  improper specimen collection/handling, submission of specimen other than nasopharyngeal swab, presence of viral mutation(s) within the areas targeted by this assay, and inadequate number of viral copies(<138 copies/mL). A negative result must be combined with clinical observations, patient history, and epidemiological information. The expected result is Negative.  Fact Sheet for Patients:  EntrepreneurPulse.com.au  Fact Sheet for Healthcare Providers:  IncredibleEmployment.be  This test is no t yet approved or cleared by the [place] FDA and  has been authorized for detection and/or diagnosis of SARS-CoV-2 by FDA under an  Emergency Use Authorization (EUA). This EUA will remain  in effect (meaning this test can be used) for the duration of the COVID-19 declaration under Section 564(b)(1) of the Act, 21 U.S.C.section 360bbb-3(b)(1), unless the authorization is terminated  or revoked sooner.       Influenza A by PCR NEGATIVE NEGATIVE Final   Influenza B by PCR NEGATIVE NEGATIVE Final    Comment: (NOTE) The Xpert Xpress SARS-CoV-2/FLU/RSV plus assay is intended as an aid in the diagnosis of influenza from Nasopharyngeal swab specimens and should not be used as a sole basis for treatment. Nasal washings and aspirates are unacceptable for Xpert Xpress SARS-CoV-2/FLU/RSV testing.  Fact Sheet for Patients: EntrepreneurPulse.com.au  Fact Sheet for Healthcare Providers: IncredibleEmployment.be  This test is not yet approved or cleared by the [place] FDA and has been authorized for detection and/or diagnosis of SARS-CoV-2 by FDA under an Emergency Use Authorization (EUA). This EUA will remain in effect (meaning this test can be used) for the duration of the COVID-19 declaration under Section 564(b)(1) of the Act, 21 U.S.C. section 360bbb-3(b)(1), unless the authorization is terminated or revoked.  Performed at [facility], [place] [address], [place], [place] [address]          Radiology Studies: DG Chest Port 1 View  Result Date: 01/20/2020 CLINICAL DATA:  Possible sepsis.  Weakness. EXAM: PORTABLE CHEST 1 VIEW COMPARISON:  01/12/2020 FINDINGS: The cardiac silhouette is mildly enlarged. Aortic atherosclerosis is noted. Mild diffuse interstitial densities in both lungs as well as reticulonodular densities in the right greater than left lung apices have not significantly changed. There is likely a persistent trace right pleural effusion. No sizable left pleural effusion is evident. No pneumothorax is identified. No acute osseous abnormality is seen.  IMPRESSION: Unchanged bilateral lung opacities which could reflect infection or edema. Persistent trace right pleural effusion. Electronically Signed   By: Logan Bores M.D.   On: 01/20/2020 01:21        Scheduled Meds: . apixaban  5 mg Oral BID  . atorvastatin  10 mg Oral QHS  . cholecalciferol  2,000 Units Oral QHS  . mirtazapine  15 mg Oral QHS   Continuous Infusions: . azithromycin    . cefTRIAXone (ROCEPHIN)  IV    . lactated ringers 75 mL/hr at 01/20/20 0241     LOS: 0 days    Time spent:40 min    Lateya Dauria, Geraldo Docker, MD [facility] Pager [phone]  If 7PM-7AM, please contact night-coverage  [email or web address] Password [facility] 01/20/2020, 10:11 AM

## 2020-01-20 NOTE — ED Notes (Signed)
Replaced pulse ox lower on cuticle and obtained good pleth within in wdl

## 2020-01-20 NOTE — ED Notes (Signed)
Breakfast Ordered 

## 2020-01-20 NOTE — ED Notes (Signed)
Patient transported to CT 

## 2020-01-20 NOTE — H&P (Signed)
History and Physical    [name] [id number] DOB: [birth year] DOA: 01/19/2020  PCP: Crist Infante, MD  Patient coming from: Home  I have personally briefly reviewed patient's old medical records in [place]  Chief Complaint: Generalized weakness  HPI: [name] is a 86 y.o. female with medical history significant of A.Fib on eliquis following a stroke early Nov, recurrent UTIs on macrodantin.  Worth noting that UA grew out K.Pnemo that was resistant to nitrofurantoin on Nov 5th.  Patient presents to the ED with c/o illness for the past 1 month.  Fever at home.  Generalized weakness.  Recent diagnosis of PNA on 12/18, put on doxycycline.  Took doxycycline without improvement in symptoms.  No abd pain, no vomiting, does have nausea.   ED Course: Satting 80s on RA, improved on 2L via [place].  Is COVID vaccinated.  CXR similar to last time = bibasilar infiltrates.  Tm 101.4, HR 107, WBC 11.6k, lactate 2.2.  COVID and flu neg.  Started on rocephin + azithro.   Review of Systems: As per HPI, otherwise all review of systems negative.  Past Medical History:  Diagnosis Date  . Atrial fibrillation ([place])    Prior treatment with amiodarone, held sinus rhythm and drug stopped  . Atrial flutter ([place])    Status post ablation prior to  2009  . Chest pain    Nuclear, 2004, no scar or ischemia, EF 77%  . Ejection fraction    EF 70%, nuclear,  2004  //     . Fall    July, 2014  . H/O bladder repair surgery   . Right Achilles tendinitis   . Sciatica     Past Surgical History:  Procedure Laterality Date  . IR CT HEAD LTD  11/14/2019  . IR PERCUTANEOUS ART THROMBECTOMY/INFUSION INTRACRANIAL INC DIAG ANGIO  11/14/2019  . IR US GUIDE VASC ACCESS RIGHT  11/14/2019  . no surgical hx    . RADIOLOGY WITH ANESTHESIA N/A 11/13/2019   Procedure: IR WITH ANESTHESIA;  Surgeon: Luanne Bras, MD;  Location: [facility];  Service: Radiology;  Laterality: N/A;     reports  that she has quit smoking. She has never used smokeless tobacco. She reports current alcohol use. She reports that she does not use drugs.  Allergies  Allergen Reactions  . Bee Venom Anaphylaxis  . Mixed Vespid Venom Anaphylaxis  . Codeine Other (See Comments)    Not known  . Sulfa Antibiotics Itching and Nausea Only  . Erythromycin Nausea And Vomiting    Family History  Problem Relation Age of Onset  . Cancer Mother   . Cancer Father        pancreatic cancer  . Heart disease Brother      Prior to Admission medications   Medication Sig Start Date End Date Taking? Authorizing Provider  atorvastatin (LIPITOR) 10 MG tablet Take 10 mg by mouth at bedtime.   Yes [provider]  Cholecalciferol (VITAMIN D3) 50 MCG (2000 UT) CAPS Take 2,000 Units by mouth at bedtime.   Yes [provider]  Cyanocobalamin (VITAMIN B12 PO) Take 1 tablet by mouth at bedtime.   Yes [provider]  doxycycline (VIBRA-TABS) 100 MG tablet Take 1 tablet (100 mg total) by mouth 2 (two) times daily. 01/12/20  Yes Volney American, PA-C  ELIQUIS 5 MG TABS tablet TAKE 1 TABLET BY MOUTH TWICE DAILY. Patient taking differently: Take 5 mg by mouth 2 (two) times daily. 01/07/20  Yes Jerline Pain, MD  mirtazapine (REMERON) 15 MG tablet Take 15 mg by mouth at bedtime.   Yes [provider]  nitrofurantoin (MACRODANTIN) 100 MG capsule Take 100 mg by mouth at bedtime.   Yes [provider]    Physical Exam: Vitals:   01/20/20 0130 01/20/20 0145 01/20/20 0215 01/20/20 0230  BP: (!) 144/77 134/86 132/72 (!) 111/57  Pulse: 94 95 94 86  Resp: 15 (!) 22 13 17   Temp: 99.4 F (37.4 C)     TempSrc: Oral     SpO2: 97% 96% 96% 93%    Constitutional: NAD, calm, comfortable Eyes: PERRL, lids and conjunctivae normal ENMT: Mucous membranes are moist. Posterior pharynx clear of any exudate or lesions.Normal dentition.  Neck: normal, supple, no masses, no  thyromegaly Respiratory: clear to auscultation bilaterally, no wheezing, no crackles. Normal respiratory effort. No accessory muscle use.  Cardiovascular: IRR, IRR Abdomen: no tenderness, no masses palpated. No hepatosplenomegaly. Bowel sounds positive.  Musculoskeletal: no clubbing / cyanosis. No joint deformity upper and lower extremities. Good ROM, no contractures. Normal muscle tone.  Skin: no rashes, lesions, ulcers. No induration Neurologic: CN 2-12 grossly intact. Sensation intact, DTR normal. Strength 5/5 in all 4.  Psychiatric: Normal judgment and insight. Alert and oriented x 3. Normal mood.    Labs on Admission: I have personally reviewed following labs and imaging studies  CBC: Recent Labs  Lab 01/19/20 2353  WBC 11.6*  NEUTROABS 10.0*  HGB 11.5*  HCT 37.7  MCV 93.8  PLT 123456   Basic Metabolic Panel: Recent Labs  Lab 01/19/20 2353  NA 142  K 3.9  CL 109  CO2 21*  GLUCOSE 122*  BUN 14  CREATININE 0.86  CALCIUM 8.5*   GFR: CrCl cannot be calculated (Unknown ideal weight.). Liver Function Tests: Recent Labs  Lab 01/19/20 2353  AST 29  ALT 25  ALKPHOS 69  BILITOT 0.9  PROT 6.2*  ALBUMIN 2.9*   No results for input(s): LIPASE, AMYLASE in the last 168 hours. No results for input(s): AMMONIA in the last 168 hours. Coagulation Profile: Recent Labs  Lab 01/19/20 2353  INR 1.4*   Cardiac Enzymes: No results for input(s): CKTOTAL, CKMB, CKMBINDEX, TROPONINI in the last 168 hours. BNP (last 3 results) No results for input(s): PROBNP in the last 8760 hours. HbA1C: No results for input(s): HGBA1C in the last 72 hours. CBG: No results for input(s): GLUCAP in the last 168 hours. Lipid Profile: No results for input(s): CHOL, HDL, LDLCALC, TRIG, CHOLHDL, LDLDIRECT in the last 72 hours. Thyroid Function Tests: No results for input(s): TSH, T4TOTAL, FREET4, T3FREE, THYROIDAB in the last 72 hours. Anemia Panel: No results for input(s): VITAMINB12, FOLATE,  FERRITIN, TIBC, IRON, RETICCTPCT in the last 72 hours. Urine analysis:    Component Value Date/Time   COLORURINE AMBER (A) 11/30/2019 0928   APPEARANCEUR CLOUDY (A) 11/30/2019 0928   LABSPEC 1.021 11/30/2019 0928   PHURINE 5.0 11/30/2019 0928   GLUCOSEU NEGATIVE 11/30/2019 0928   HGBUR SMALL (A) 11/30/2019 0928   BILIRUBINUR NEGATIVE 11/30/2019 0928   KETONESUR NEGATIVE 11/30/2019 0928   PROTEINUR 30 (A) 11/30/2019 0928   NITRITE POSITIVE (A) 11/30/2019 0928   LEUKOCYTESUR LARGE (A) 11/30/2019 0928    Radiological Exams on Admission: DG Chest Port 1 View  Result Date: 01/20/2020 CLINICAL DATA:  Possible sepsis.  Weakness. EXAM: PORTABLE CHEST 1 VIEW COMPARISON:  01/12/2020 FINDINGS: The cardiac silhouette is mildly enlarged. Aortic atherosclerosis is noted. Mild diffuse interstitial  densities in both lungs as well as reticulonodular densities in the right greater than left lung apices have not significantly changed. There is likely a persistent trace right pleural effusion. No sizable left pleural effusion is evident. No pneumothorax is identified. No acute osseous abnormality is seen. IMPRESSION: Unchanged bilateral lung opacities which could reflect infection or edema. Persistent trace right pleural effusion. Electronically Signed   By: Logan Bores M.D.   On: 01/20/2020 01:21    EKG: Independently reviewed.  Assessment/Plan Principal Problem:   Sepsis due to pneumonia [facility]) Active Problems:   Atrial fibrillation ([place])   Acute respiratory failure with hypoxia ([place])   CAP (community acquired pneumonia)    1. CAP with sepsis, new O2 requirement - 1. Failed outpt doxycycline 2. CAP pathway 3. Rocephin + azithromycin 4. IVF: 1250cc LR bolus + LR at 75 5. BCx, sputum Cx pending 6. UA pending 2. A.Fib - 1. Cont eliquis 2. Rate controlled 3. Tele monitor for 48h  DVT prophylaxis: Eliquis Code Status: Full Family Communication: No family in room Disposition Plan: Home  after sepsis and O2 requirement improved Consults called: None Admission status: Admit to inpatient  Severity of Illness: The appropriate patient status for this patient is INPATIENT. Inpatient status is judged to be reasonable and necessary in order to provide the required intensity of service to ensure the patient's safety. The patient's presenting symptoms, physical exam findings, and initial radiographic and laboratory data in the context of their chronic comorbidities is felt to place them at high risk for further clinical deterioration. Furthermore, it is not anticipated that the patient will be medically stable for discharge from the hospital within 2 midnights of admission. The following factors support the patient status of inpatient.   IP status due to: 1) CAP with sepsis. 2) New O2 requirement 3) Failed outpatient antibiotics   * I certify that at the point of admission it is my clinical judgment that the patient will require inpatient hospital care spanning beyond 2 midnights from the point of admission due to high intensity of service, high risk for further deterioration and high frequency of surveillance required.*    Tameka Hoiland M. DO [facility]  How to contact the [facility] Attending or Consulting provider [place] or covering provider during after hours [place], for this patient?  1. Check the care team in [facility] and look for a) attending/consulting [facility] provider listed and b) the [facility] team listed 2. Log into [email or web address]  Amion Physician Scheduling and messaging for groups and whole hospitals  On call and physician scheduling software for group practices, residents, hospitalists and other medical providers for call, clinic, rotation and shift schedules. OnCall Enterprise is a hospital-wide system for scheduling doctors and paging doctors on call. EasyPlot is for scientific plotting and data analysis.  [email or web address]  and use [place]'s universal password to access. If you  do not have the password, please contact the hospital operator.  3. Locate the [facility] provider you are looking for under [facility] and page to a number that you can be directly reached. 4. If you still have difficulty reaching the provider, please page the [facility] (Director on Call) for the Hospitalists listed on amion for assistance.  01/20/2020, 3:03 AM

## 2020-01-20 NOTE — ED Notes (Signed)
Report called to 2W RN  Patient to be transported with all belongings

## 2020-01-20 NOTE — ED Provider Notes (Signed)
Medical screening examination/treatment/procedure(s) were conducted as a shared visit with non-physician practitioner(s) and myself.  I personally evaluated the patient during the encounter.  Recently with pneumonia. Had weakness. Still not doing well. Still on antibiotics.  Fever/tachycardia/tachypnea.  Sepsis called on arrival. Will presumptively treat for CAP. Fluids ordered.  xr similar to previous. Discussed ct scan, hospitalist requests holding off on it to see if she improves with abx. Admitted to hospitalist.    CRITICAL CARE Performed by: Merrily Pew Total critical care time: 35 minutes Critical care time was exclusive of separately billable procedures and treating other patients. Critical care was necessary to treat or prevent imminent or life-threatening deterioration. Critical care was time spent personally by me on the following activities: development of treatment plan with patient and/or surrogate as well as nursing, discussions with consultants, evaluation of patient's response to treatment, examination of patient, obtaining history from patient or surrogate, ordering and performing treatments and interventions, ordering and review of laboratory studies, ordering and review of radiographic studies, pulse oximetry and re-evaluation of patient's condition.         Tavis Kring, Corene Cornea, MD 01/20/20 [id number]

## 2020-01-20 NOTE — ED Notes (Signed)
FIRST ENCOUNTER  Patient resting in bed, no issues noted at this time  Denies pain   On 4L [place]-- 98%  Daughter at bedside

## 2020-01-21 ENCOUNTER — Inpatient Hospital Stay (HOSPITAL_COMMUNITY): Payer: Medicare Other

## 2020-01-21 DIAGNOSIS — I480 Paroxysmal atrial fibrillation: Secondary | ICD-10-CM | POA: Diagnosis not present

## 2020-01-21 DIAGNOSIS — J9601 Acute respiratory failure with hypoxia: Secondary | ICD-10-CM | POA: Diagnosis not present

## 2020-01-21 DIAGNOSIS — J189 Pneumonia, unspecified organism: Secondary | ICD-10-CM | POA: Diagnosis not present

## 2020-01-21 DIAGNOSIS — A419 Sepsis, unspecified organism: Secondary | ICD-10-CM | POA: Diagnosis not present

## 2020-01-21 LAB — URINALYSIS, ROUTINE W REFLEX MICROSCOPIC
Bilirubin Urine: NEGATIVE
Glucose, UA: NEGATIVE mg/dL
Hgb urine dipstick: NEGATIVE
Ketones, ur: NEGATIVE mg/dL
Leukocytes,Ua: NEGATIVE
Nitrite: NEGATIVE
Protein, ur: NEGATIVE mg/dL
Specific Gravity, Urine: 1.012 (ref 1.005–1.030)
pH: 5 (ref 5.0–8.0)

## 2020-01-21 LAB — CBC WITH DIFFERENTIAL/PLATELET
Abs Immature Granulocytes: 0.03 10*3/uL (ref 0.00–0.07)
Basophils Absolute: 0 10*3/uL (ref 0.0–0.1)
Basophils Relative: 0 %
Eosinophils Absolute: 0 10*3/uL (ref 0.0–0.5)
Eosinophils Relative: 0 %
HCT: 34.1 % — ABNORMAL LOW (ref 36.0–46.0)
Hemoglobin: 10.9 g/dL — ABNORMAL LOW (ref 12.0–15.0)
Immature Granulocytes: 0 %
Lymphocytes Relative: 16 %
Lymphs Abs: 1.1 10*3/uL (ref 0.7–4.0)
MCH: 29.5 pg (ref 26.0–34.0)
MCHC: 32 g/dL (ref 30.0–36.0)
MCV: 92.2 fL (ref 80.0–100.0)
Monocytes Absolute: 0.5 10*3/uL (ref 0.1–1.0)
Monocytes Relative: 7 %
Neutro Abs: 5.4 10*3/uL (ref 1.7–7.7)
Neutrophils Relative %: 77 %
Platelets: 286 10*3/uL (ref 150–400)
RBC: 3.7 MIL/uL — ABNORMAL LOW (ref 3.87–5.11)
RDW: 14.3 % (ref 11.5–15.5)
WBC: 6.9 10*3/uL (ref 4.0–10.5)
nRBC: 0 % (ref 0.0–0.2)

## 2020-01-21 LAB — COMPREHENSIVE METABOLIC PANEL
ALT: 23 U/L (ref 0–44)
AST: 27 U/L (ref 15–41)
Albumin: 2.7 g/dL — ABNORMAL LOW (ref 3.5–5.0)
Alkaline Phosphatase: 58 U/L (ref 38–126)
Anion gap: 10 (ref 5–15)
BUN: 12 mg/dL (ref 8–23)
CO2: 27 mmol/L (ref 22–32)
Calcium: 9 mg/dL (ref 8.9–10.3)
Chloride: 105 mmol/L (ref 98–111)
Creatinine, Ser: 0.85 mg/dL (ref 0.44–1.00)
GFR, Estimated: >60 mL/min (ref >60)
Glucose, Bld: 145 mg/dL — ABNORMAL HIGH (ref 70–99)
Potassium: 3.8 mmol/L (ref 3.5–5.1)
Sodium: 142 mmol/L (ref 135–145)
Total Bilirubin: 0.6 mg/dL (ref 0.3–1.2)
Total Protein: 6.1 g/dL — ABNORMAL LOW (ref 6.5–8.1)

## 2020-01-21 LAB — PROCALCITONIN: Procalcitonin: 0.8 ng/mL

## 2020-01-21 LAB — PHOSPHORUS: Phosphorus: 3.3 mg/dL (ref 2.5–4.6)

## 2020-01-21 LAB — MAGNESIUM: Magnesium: 2 mg/dL (ref 1.7–2.4)

## 2020-01-21 MED ORDER — LACTATED RINGERS IV SOLN: INTRAVENOUS | Status: DC

## 2020-01-21 MED ORDER — ENSURE ENLIVE PO LIQD
237.0000 mL | Freq: Three times a day (TID) | ORAL | Status: DC
  Administered 2020-01-21 – 2020-01-25 (×8): 237 mL via ORAL

## 2020-01-21 MED ORDER — ADULT MULTIVITAMIN W/MINERALS CH
1.0000 | ORAL_TABLET | Freq: Every day | ORAL | Status: DC
  Administered 2020-01-21 – 2020-01-25 (×5): 1 via ORAL
  Filled 2020-01-21 (×5): qty 1

## 2020-01-21 NOTE — Progress Notes (Signed)
PROGRESS NOTE    [name]  HWK:[id number] DOB: 11-03-1931 DOA: 01/19/2020 PCP: Melissa Infante, MD     Brief Narrative:  86 y.o. WF PMHx COPD (emphysema), A.Fib on Eliquis following a stroke early Nov, recurrent UTIs on macrodantin.  Worth noting that UA grew out [name] that was resistant to nitrofurantoin on Nov 5th.  Patient presents to the ED with c/o illness for the past 1 month.  Fever at home.  Generalized weakness.  Recent diagnosis of PNA on 12/18, put on doxycycline.  Took doxycycline without improvement in symptoms.  No abd pain, no vomiting, does have nausea.   ED Course: Satting 80s on RA, improved on 2L via [place].  Is COVID vaccinated.  CXR similar to last time = bibasilar infiltrates.  Tm 101.4, HR 107, WBC 11.6k, lactate 2.2.  COVID and flu neg.  Started on rocephin + azithro.   Subjective: 12/27 afebrile overnight A/O x4, positive S OB.  Son states he and family were not aware of COPD until last hospitalization.  Review of CT abdomen pelvis show lower lung emphysematous changes.   Assessment & Plan: Covid vaccination; vaccinated   Principal Problem:   Sepsis due to pneumonia [facility]) Active Problems:   Atrial fibrillation ([place])   Acute respiratory failure with hypoxia ([place])   CAP (community acquired pneumonia)   Severe sepsis -On admission patient met criteria for severe sepsis temp> 38 C, HR> 90, WBC> 12 K, site of infection lungs, lactic acid> 2  Acute respiratory failure with hypoxia/COPD/CAP -Continue current antibiotics x7 days -DuoNeb QID -Incentive spirometry -Flutter valve -Solu-Medrol 60 mg daily -Trend procalcitonin/lactic acid Results for Melissa, Lynch (MRN [id number]) as of 01/21/2020 17:47  Ref. Range 01/20/2020 03:32 01/21/2020 01:38  Procalcitonin Latest Units: ng/mL 0.58 0.80   Results for Melissa, Lynch (MRN [id number]) as of 01/21/2020 17:47  Ref. Range 12/04/2019 09:43 01/19/2020 23:53 01/20/2020 02:34  Lactic  Acid, Venous Latest Ref Range: 0.5 - 1.9 mmol/L 1.3 2.2 (HH) 1.6  -12/27 decrease Lactated Ringer's 75 ml/hr -12/27 CT chest W0 contrast to fully evaluate emphysema and fibrosis, partially seen on November abdominal CT  A. fib -Currently rate controlled without medication -Continue Eliquis     DVT prophylaxis: Eliquis Code Status: Full Family Communication: 12/27 son at bedside for discussion of plan of care answered all questions Status is: Inpatient    Dispo: The patient is from: Home              Anticipated d/c is to: Home              Anticipated d/c date is: 12/30              Patient currently unstable      Consultants:    Procedures/Significant Events:  11/25 CT abdomen pelvis W contrast; Emphysematous changes and mild subpleural peripheral fibrosis in the lung bases. Minimal left pleural effusion with atelectasis in the left base. Focal pleural base nodularity in the right lung base appears unchanged since prior study. Cardiac enlargement. Coronary artery calcifications. Small esophageal hiatal hernia. ---------------------------------------------------------------------------------------------------------------------------------- 12/25 PCXR;Unchanged bilateral lung opacities which could reflect infection or edema. Persistent trace right pleural effusion.   I have personally reviewed and interpreted all radiology studies and my findings are as above.  VENTILATOR SETTINGS: Nasal cannula 12/27  flow 3 L/min SPO2; 100%   Cultures 12/26 SARS coronavirus negative 12/26 influenza A/B negative 12/26 blood pending 12/26 sputum pending  Antimicrobials: Anti-infectives (From admission, onward)   Start  Ordered Stop   01/20/20 2200  cefTRIAXone (ROCEPHIN) 2 g in sodium chloride 0.9 % 100 mL IVPB        01/20/20 0245 01/25/20 2159   01/20/20 2200  azithromycin (ZITHROMAX) 500 mg in sodium chloride 0.9 % 250 mL IVPB        01/20/20 0245 01/25/20 2159    01/20/20 0030  cefTRIAXone (ROCEPHIN) 1 g in sodium chloride 0.9 % 100 mL IVPB        01/20/20 0026 01/20/20 0130   01/20/20 0030  azithromycin (ZITHROMAX) 500 mg in sodium chloride 0.9 % 250 mL IVPB        01/20/20 0026 01/20/20 0203       Devices    LINES / TUBES:      Continuous Infusions: . azithromycin 500 mg (01/20/20 2116)  . cefTRIAXone (ROCEPHIN)  IV 2 g (01/20/20 2120)     Objective: Vitals:   01/20/20 1523 01/20/20 2054 01/21/20 0631 01/21/20 0806  BP: 103/68 109/61 134/71 132/88  Pulse: 83 79 74 75  Resp: _0 Temp: 98.1 F (36.7 C) 97.6 F (36.4 C) 97.7 F (36.5 C) 97.8 F (36.6 C)  TempSrc:  Oral Oral Oral  SpO2: 99% 98% 100% 99%   No intake or output data in the 24 hours ending 01/21/20 0901 There were no vitals filed for this visit.  Examination:  General: A/O x4, positive acute respiratory distress Eyes: negative scleral hemorrhage, negative anisocoria, negative icterus ENT: Negative Runny nose, negative gingival bleeding, Neck:  Negative scars, masses, torticollis, lymphadenopathy, JVD Lungs: tachypneic, decreased breath sounds bilaterally without wheezes or crackles Cardiovascular: Regular rate and rhythm without murmur gallop or rub normal S1 and S2 Abdomen: negative abdominal pain, nondistended, positive soft, bowel sounds, no rebound, no ascites, no appreciable mass Extremities: No significant cyanosis, clubbing, or edema bilateral lower extremities Skin: Negative rashes, lesions, ulcers Psychiatric:  Negative depression, negative anxiety, negative fatigue, negative mania  Central nervous system:  Cranial nerves II through XII intact, tongue/uvula midline, all extremities muscle strength 5/5, sensation intact throughout, negative dysarthria, negative expressive aphasia, negative receptive aphasia.  .     Data Reviewed: Care during the described time interval was provided by me .  I have reviewed this patient's available data,  including medical history, events of note, physical examination, and all test results as part of my evaluation.  CBC: Recent Labs  Lab 01/19/20 2353 01/20/20 0333  WBC 11.6* 13.1*  NEUTROABS 10.0*  --   HGB 11.5* 10.7*  HCT 37.7 34.7*  MCV 93.8 92.8  PLT 305 734   Basic Metabolic Panel: Recent Labs  Lab 01/19/20 2353 01/20/20 0333  NA 142 139  K 3.9 4.1  CL 109 107  CO2 21* 23  GLUCOSE 122* 119*  BUN 14 11  CREATININE 0.86 0.77  CALCIUM 8.5* 8.1*   GFR: CrCl cannot be calculated (Unknown ideal weight.). Liver Function Tests: Recent Labs  Lab 01/19/20 2353 01/20/20 0333  AST 29 28  ALT 25 22  ALKPHOS 69 58  BILITOT 0.9 0.9  PROT 6.2* 5.3*  ALBUMIN 2.9* 2.5*   No results for input(s): LIPASE, AMYLASE in the last 168 hours. No results for input(s): AMMONIA in the last 168 hours. Coagulation Profile: Recent Labs  Lab 01/19/20 2353  INR 1.4*   Cardiac Enzymes: No results for input(s): CKTOTAL, CKMB, CKMBINDEX, TROPONINI in the last 168 hours. BNP (last 3 results) No results for input(s): PROBNP in the last 8760 hours.  HbA1C: No results for input(s): HGBA1C in the last 72 hours. CBG: No results for input(s): GLUCAP in the last 168 hours. Lipid Profile: No results for input(s): CHOL, HDL, LDLCALC, TRIG, CHOLHDL, LDLDIRECT in the last 72 hours. Thyroid Function Tests: No results for input(s): TSH, T4TOTAL, FREET4, T3FREE, THYROIDAB in the last 72 hours. Anemia Panel: No results for input(s): VITAMINB12, FOLATE, FERRITIN, TIBC, IRON, RETICCTPCT in the last 72 hours. Sepsis Labs: Recent Labs  Lab 01/19/20 2353 01/20/20 0234 01/20/20 0332 01/21/20 0138  PROCALCITON  --   --  0.58 0.80  LATICACIDVEN 2.2* 1.6  --   --     Recent Results (from the past 240 hour(s))  Blood culture (routine single)     Status: None (Preliminary result)   Collection Time: 01/20/20 12:01 AM   Specimen: BLOOD  Result Value Ref Range Status   Specimen Description BLOOD SITE  NOT SPECIFIED  Final   Special Requests   Final    BOTTLES DRAWN AEROBIC AND ANAEROBIC Blood Culture results may not be optimal due to an inadequate volume of blood received in culture bottles   Culture   Final    NO GROWTH 1 DAY Performed at [facility], [place] [address], [place],  [address]    Report Status PENDING  Incomplete  Resp Panel by RT-PCR (Flu A&B, Covid) Nasopharyngeal Swab     Status: None   Collection Time: 01/20/20 12:07 AM   Specimen: Nasopharyngeal Swab; Nasopharyngeal(NP) swabs in vial transport medium  Result Value Ref Range Status   SARS Coronavirus 2 by RT PCR NEGATIVE NEGATIVE Final    Comment: (NOTE) SARS-CoV-2 target nucleic acids are NOT DETECTED.  The SARS-CoV-2 RNA is generally detectable in upper respiratory specimens during the acute phase of infection. The lowest concentration of SARS-CoV-2 viral copies this assay can detect is 138 copies/mL. A negative result does not preclude SARS-Cov-2 infection and should not be used as the sole basis for treatment or other patient management decisions. A negative result may occur with  improper specimen collection/handling, submission of specimen other than nasopharyngeal swab, presence of viral mutation(s) within the areas targeted by this assay, and inadequate number of viral copies(<138 copies/mL). A negative result must be combined with clinical observations, patient history, and epidemiological information. The expected result is Negative.  Fact Sheet for Patients:  EntrepreneurPulse.com.au  Fact Sheet for Healthcare Providers:  IncredibleEmployment.be  This test is no t yet approved or cleared by the [place] FDA and  has been authorized for detection and/or diagnosis of SARS-CoV-2 by FDA under an Emergency Use Authorization (EUA). This EUA will remain  in effect (meaning this test can be used) for the duration of the COVID-19 declaration under Section  564(b)(1) of the Act, 21 U.S.C.section 360bbb-3(b)(1), unless the authorization is terminated  or revoked sooner.       Influenza A by PCR NEGATIVE NEGATIVE Final   Influenza B by PCR NEGATIVE NEGATIVE Final    Comment: (NOTE) The Xpert Xpress SARS-CoV-2/FLU/RSV plus assay is intended as an aid in the diagnosis of influenza from Nasopharyngeal swab specimens and should not be used as a sole basis for treatment. Nasal washings and aspirates are unacceptable for Xpert Xpress SARS-CoV-2/FLU/RSV testing.  Fact Sheet for Patients: EntrepreneurPulse.com.au  Fact Sheet for Healthcare Providers: IncredibleEmployment.be  This test is not yet approved or cleared by the [place] FDA and has been authorized for detection and/or diagnosis of SARS-CoV-2 by FDA under an Emergency Use Authorization (EUA). This  EUA will remain in effect (meaning this test can be used) for the duration of the COVID-19 declaration under Section 564(b)(1) of the Act, 21 U.S.C. section 360bbb-3(b)(1), unless the authorization is terminated or revoked.  Performed at [facility], [place] [address], [place], [place] [address]          Radiology Studies: DG Chest Port 1 View  Result Date: 01/20/2020 CLINICAL DATA:  Possible sepsis.  Weakness. EXAM: PORTABLE CHEST 1 VIEW COMPARISON:  01/12/2020 FINDINGS: The cardiac silhouette is mildly enlarged. Aortic atherosclerosis is noted. Mild diffuse interstitial densities in both lungs as well as reticulonodular densities in the right greater than left lung apices have not significantly changed. There is likely a persistent trace right pleural effusion. No sizable left pleural effusion is evident. No pneumothorax is identified. No acute osseous abnormality is seen. IMPRESSION: Unchanged bilateral lung opacities which could reflect infection or edema. Persistent trace right pleural effusion. Electronically Signed   By: Logan Bores  M.D.   On: 01/20/2020 01:21        Scheduled Meds: . apixaban  5 mg Oral BID  . atorvastatin  10 mg Oral QHS  . cholecalciferol  2,000 Units Oral QHS  . methylPREDNISolone (SOLU-MEDROL) injection  60 mg Intravenous Q24H  . mirtazapine  15 mg Oral QHS   Continuous Infusions: . azithromycin 500 mg (01/20/20 2116)  . cefTRIAXone (ROCEPHIN)  IV 2 g (01/20/20 2120)     LOS: 1 day    Time spent:40 min    Beena Catano, Geraldo Docker, MD [facility] Pager [phone]  If 7PM-7AM, please contact night-coverage [email or web address] Password [email or web address] 01/21/2020, 9:01 AM

## 2020-01-21 NOTE — Progress Notes (Signed)
Initial Nutrition Assessment  DOCUMENTATION CODES:   Not applicable  INTERVENTION:   Recommend liberalizing pt's diet to regular to encourage po intake  Ensure Enlive po TID, each supplement provides 350 kcal and 20 grams of protein  Magic cup TID with meals, each supplement provides 290 kcal and 9 grams of protein  MVI daily   NUTRITION DIAGNOSIS:   Inadequate oral intake related to decreased appetite as evidenced by per patient/family report.    GOAL:   Patient will meet greater than or equal to 90% of their needs    MONITOR:   PO intake,Supplement acceptance,Labs,Weight trends,I & O's  REASON FOR ASSESSMENT:   Malnutrition Screening Tool    ASSESSMENT:   Pt admitted with sepsis 2/2 CAP. PMH includes Afib, h/o stroke (Nov 2021), and recurrent UTIs.   Pt unavailable at time of RD visit. Will order oral nutrition supplements due to pt/family reporting poor appetite on the MST report.   No PO intake documented.   Weight has not been obtained from this admission. Last weight reading from 01/03/20 was 61.7 kg. Will use this to estimate kcal/protein needs. Unable to truly assess weight history given lack of updated weight; however, pt noted to have lost 7.08% bodyweight x2 months between 11/13/19(66.4 kg) and 01/03/20 (61.7 kg). This is significant for timeframe.   No UOP documented.   Labs reviewed.  Medications: vitamin D3, solu-medrol, remeron  NUTRITION - FOCUSED PHYSICAL EXAM:  Unable to perform at this time; will attempt at follow-up.  Diet Order:   Diet Order            Diet Heart Room service appropriate? Yes; Fluid consistency: Thin  Diet effective now                 EDUCATION NEEDS:   No education needs have been identified at this time  Skin:  Skin Assessment: Reviewed RN Assessment  Last BM:  12/26  Height:   Ht Readings from Last 1 Encounters:  01/03/20 5' (1.524 m)    Weight:   Wt Readings from Last 1 Encounters:  01/21/20  61.7 kg    BMI:  Body mass index is 26.56 kg/m.  Estimated Nutritional Needs:   Kcal:  1400-1600  Protein:  70-80 grams  Fluid:  >1.4L/d    Eugene Gavia, MS, RD, LDN RD pager number and weekend/on-call pager number located in [facility].

## 2020-01-21 NOTE — Discharge Instructions (Addendum)
Community-Acquired Pneumonia, Adult Pneumonia is an infection of the lungs. It causes swelling in the airways of the lungs. Mucus and fluid may also build up inside the airways. One type of pneumonia can happen while a person is in a hospital. A different type can happen when a person is not in a hospital (community-acquired pneumonia).  What are the causes?  This condition is caused by germs (viruses, bacteria, or fungi). Some types of germs can be passed from one person to another. This can happen when you breathe in droplets from the cough or sneeze of an infected person. What increases the risk? You are more likely to develop this condition if you:  Have a long-term (chronic) disease, such as: ? Chronic obstructive pulmonary disease (COPD). ? Asthma. ? Cystic fibrosis. ? Congestive heart failure. ? Diabetes. ? Kidney disease.  Have HIV.  Have sickle cell disease.  Have had your spleen removed.  Do not take good care of your teeth and mouth (poor dental hygiene).  Have a medical condition that increases the risk of breathing in droplets from your own mouth and nose.  Have a weakened body defense system (immune system).  Are a smoker.  Travel to areas where the germs that cause this illness are common.  Are around certain animals or the places they live. What are the signs or symptoms?  A dry cough.  A wet (productive) cough.  Fever.  Sweating.  Chest pain. This often happens when breathing deeply or coughing.  Fast breathing or trouble breathing.  Shortness of breath.  Shaking chills.  Feeling tired (fatigue).  Muscle aches. How is this treated? Treatment for this condition depends on many things. Most adults can be treated at home. In some cases, treatment must happen in a hospital. Treatment may include:  Medicines given by mouth or through an IV tube.  Being given extra oxygen.  Respiratory therapy. In rare cases, treatment for very bad  pneumonia may include:  Using a machine to help you breathe.  Having a procedure to remove fluid from around your lungs. Follow these instructions at home: Medicines  Take over-the-counter and prescription medicines only as told by your doctor. ? Only take cough medicine if you are losing sleep.  If you were prescribed an antibiotic medicine, take it as told by your doctor. Do not stop taking the antibiotic even if you start to feel better. General instructions   Sleep with your head and neck raised (elevated). You can do this by sleeping in a recliner or by putting a few pillows under your head.  Rest as needed. Get at least 8 hours of sleep each night.  Drink enough water to keep your pee (urine) pale yellow.  Eat a healthy diet that includes plenty of vegetables, fruits, whole grains, low-fat dairy products, and lean protein.  Do not use any products that contain nicotine or tobacco. These include cigarettes, e-cigarettes, and chewing tobacco. If you need help quitting, ask your doctor.  Keep all follow-up visits as told by your doctor. This is important. How is this prevented? A shot (vaccine) can help prevent pneumonia. Shots are often suggested for:  People older than 84 years of age.  People older than 84 years of age who: ? Are having cancer treatment. ? Have long-term (chronic) lung disease. ? Have problems with their body's defense system. You may also prevent pneumonia if you take these actions:  Get the flu (influenza) shot every year.  Go to the dentist  as often as told.  Wash your hands often. If you cannot use soap and water, use hand sanitizer. Contact a doctor if:  You have a fever.  You lose sleep because your cough medicine does not help. Get help right away if:  You are short of breath and it gets worse.  You have more chest pain.  Your sickness gets worse. This is very serious if: ? You are an older adult. ? Your body's defense system is  weak.  You cough up blood. Summary  Pneumonia is an infection of the lungs.  Most adults can be treated at home. Some will need treatment in a hospital.  Drink enough water to keep your pee pale yellow.  Get at least 8 hours of sleep each night. This information is not intended to replace advice given to you by your health care provider. Make sure you discuss any questions you have with your health care provider. Document Revised: 05/03/2018 Document Reviewed: 09/08/2017 Elsevier Patient Education  2020 [employer]   Sepsis, Diagnosis, Adult Sepsis is a serious bodily reaction to an infection. The infection that triggers sepsis may be from a bacteria, virus, or fungus. Sepsis can result from an infection in any part of your body. Infections that commonly lead to sepsis include skin, lung, and urinary tract infections. Sepsis is a medical emergency that must be treated right away in a hospital. In severe cases, it can lead to septic shock. Septic shock can weaken your heart and cause your blood pressure to drop. This can cause your central nervous system and your body's organs to stop working. What are the causes? This condition is caused by a severe reaction to infections from bacteria, viruses, or fungus. The germs that most often lead to sepsis include:  Escherichia coli (E. coli) bacteria.  Staphylococcus aureus (staph) bacteria.  Some types of Streptococcus bacteria. The most common infections affect these organs:  The lung (pneumonia).  The kidneys or bladder (urinary tract infection).  The skin (cellulitis).  The bowel, gallbladder, or pancreas. What increases the risk? You are more likely to develop this condition if:  Your body's disease-fighting system (immune system) is weakened.  You are age 13 or older.  You are female.  You had surgery or you have been hospitalized.  You have these devices inserted into your body: ? A small, thin tube (catheter). ? IV  line. ? Breathing tube. ? Drainage tube.  You are not getting enough nutrients from food (malnourished).  You have a long-term (chronic) disease, such as cancer, lung disease, kidney disease, or diabetes.  You are African American. What are the signs or symptoms? Symptoms of this condition may include:  Fever.  Chills or feeling very cold.  Confusion or anxiety.  Fatigue.  Muscle aches.  Shortness of breath.  Nausea and vomiting.  Urinating much less than usual.  Fast heart rate (tachycardia).  Rapid breathing (hyperventilation).  Changes in skin color. Your skin may look blotchy, pale, or blue.  Cool, clammy, or sweaty skin.  Skin rash. Other symptoms depend on the source of your infection. How is this diagnosed? This condition is diagnosed based on:  Your symptoms.  Your medical history.  A physical exam. Other tests may also be done to find out the cause of the infection and how severe the sepsis is. These tests may include:  Blood tests.  Urine tests.  Swabs from other areas of your body that may have an infection. These samples may be  tested (cultured) to find out what type of bacteria is causing the infection.  Chest X-ray to check for pneumonia. Other imaging tests, such as a CT scan, may also be done.  Lumbar puncture. This removes a small amount of the fluid that surrounds your brain and spinal cord. The fluid is then examined for infection. How is this treated? This condition must be treated in a hospital. Based on the cause of your infection, you may be given an antibiotic, antiviral, or antifungal medicine. You may also receive:  Fluids through an IV.  Oxygen and breathing assistance.  Medicines to increase your blood pressure.  Kidney dialysis. This process cleans your blood if your kidneys have failed.  Surgery to remove infected tissue.  Blood transfusion if needed.  Medicine to prevent blood clots.  Nutrients to correct  imbalances in basic body function (metabolism). You may: ? Receive important salts and minerals (electrolytes) through an IV. ? Have your blood sugar level adjusted. Follow these instructions at home: Medicines   Take over-the-counter and prescription medicines only as told by your health care provider.  If you were prescribed an antibiotic, antiviral, or antifungal medicine, take it as told by your health care provider. Do not stop taking the medicine even if you start to feel better. General instructions  If you have a catheter or other indwelling device, ask to have it removed as soon as possible.  Keep all follow-up visits as told by your health care provider. This is important. Contact a health care provider if:  You do not feel like you are getting better or regaining strength.  You are having trouble coping with your recovery.  You frequently feel tired.  You feel worse or do not seem to get better after surgery.  You think you may have an infection after surgery. Get help right away if:  You have any symptoms of sepsis.  You have difficulty breathing.  You have a rapid or skipping heartbeat.  You become confused or disoriented.  You have a high fever.  Your skin becomes blotchy, pale, or blue.  You have an infection that is getting worse or not getting better. These symptoms may represent a serious problem that is an emergency. Do not wait to see if the symptoms will go away. Get medical help right away. Call your local emergency services (911 in the [place] Do not drive yourself to the hospital. Summary  Sepsis is a medical emergency that requires immediate treatment in a hospital.  This condition is caused by a severe reaction to infections from bacteria, viruses, or fungus.  Based on the cause of your infection, you may be given an antibiotic, antiviral, or antifungal medicine.  Treatment may also include IV fluids, breathing assistance, and kidney  dialysis. This information is not intended to replace advice given to you by your health care provider. Make sure you discuss any questions you have with your health care provider. Document Revised: 08/19/2017 Document Reviewed: 08/19/2017 Elsevier Patient Education  [place].  Urinary Tract Infection, Adult A urinary tract infection (UTI) is an infection of any part of the urinary tract. The urinary tract includes:  The kidneys.  The ureters.  The bladder.  The urethra. These organs make, store, and get rid of pee (urine) in the body. What are the causes? This is caused by germs (bacteria) in your genital area. These germs grow and cause swelling (inflammation) of your urinary tract. What increases the risk? You are more likely to  develop this condition if:  You have a small, thin tube (catheter) to drain pee.  You cannot control when you pee or poop (incontinence).  You are female, and: ? You use these methods to prevent pregnancy:  A medicine that kills sperm (spermicide).  A device that blocks sperm (diaphragm). ? You have low levels of a female hormone (estrogen). ? You are pregnant.  You have genes that add to your risk.  You are sexually active.  You take antibiotic medicines.  You have trouble peeing because of: ? A prostate that is bigger than normal, if you are female. ? A blockage in the part of your body that drains pee from the bladder (urethra). ? A kidney stone. ? A nerve condition that affects your bladder (neurogenic bladder). ? Not getting enough to drink. ? Not peeing often enough.  You have other conditions, such as: ? Diabetes. ? A weak disease-fighting system (immune system). ? Sickle cell disease. ? Gout. ? Injury of the spine. What are the signs or symptoms? Symptoms of this condition include:  Needing to pee right away (urgently).  Peeing often.  Peeing small amounts often.  Pain or burning when peeing.  Blood in the  pee.  Pee that smells bad or not like normal.  Trouble peeing.  Pee that is cloudy.  Fluid coming from the vagina, if you are female.  Pain in the belly or lower back. Other symptoms include:  Throwing up (vomiting).  No urge to eat.  Feeling mixed up (confused).  Being tired and grouchy (irritable).  A fever.  Watery poop (diarrhea). How is this treated? This condition may be treated with:  Antibiotic medicine.  Other medicines.  Drinking enough water. Follow these instructions at home:  Medicines  Take over-the-counter and prescription medicines only as told by your doctor.  If you were prescribed an antibiotic medicine, take it as told by your doctor. Do not stop taking it even if you start to feel better. General instructions  Make sure you: ? Pee until your bladder is empty. ? Do not hold pee for a long time. ? Empty your bladder after sex. ? Wipe from front to back after pooping if you are a female. Use each tissue one time when you wipe.  Drink enough fluid to keep your pee pale yellow.  Keep all follow-up visits as told by your doctor. This is important. Contact a doctor if:  You do not get better after 1-2 days.  Your symptoms go away and then come back. Get help right away if:  You have very bad back pain.  You have very bad pain in your lower belly.  You have a fever.  You are sick to your stomach (nauseous).  You are throwing up. Summary  A urinary tract infection (UTI) is an infection of any part of the urinary tract.  This condition is caused by germs in your genital area.  There are many risk factors for a UTI. These include having a small, thin tube to drain pee and not being able to control when you pee or poop.  Treatment includes antibiotic medicines for germs.  Drink enough fluid to keep your pee pale yellow. This information is not intended to replace advice given to you by your health care provider. Make sure you  discuss any questions you have with your health care provider. Document Revised: 12/29/2017 Document Reviewed: 07/21/2017 Elsevier Patient Education  2020 [place] on my medicine - ELIQUIS (apixaban)  This medication education was reviewed with me or my healthcare representative as part of my discharge preparation.    Why was Eliquis prescribed for you? Eliquis was prescribed for you to reduce the risk of a blood clot forming that can cause a stroke if you have a medical condition called atrial fibrillation (a type of irregular heartbeat).  What do You need to know about Eliquis ? Take your Eliquis TWICE DAILY - one tablet in the morning and one tablet in the evening with or without food. If you have difficulty swallowing the tablet whole please discuss with your pharmacist how to take the medication safely.  Take Eliquis exactly as prescribed by your doctor and DO NOT stop taking Eliquis without talking to the doctor who prescribed the medication.  Stopping may increase your risk of developing a stroke.  Refill your prescription before you run out.  After discharge, you should have regular check-up appointments with your healthcare provider that is prescribing your Eliquis.  In the future your dose may need to be changed if your kidney function or weight changes by a significant amount or as you get older.  What do you do if you miss a dose? If you miss a dose, take it as soon as you remember on the same day and resume taking twice daily.  Do not take more than one dose of ELIQUIS at the same time to make up a missed dose.  Important Safety Information A possible side effect of Eliquis is bleeding. You should call your healthcare provider right away if you experience any of the following: ? Bleeding from an injury or your nose that does not stop. ? Unusual colored urine (red or dark brown) or unusual colored stools (red or black). ? Unusual bruising for unknown  reasons. ? A serious fall or if you hit your head (even if there is no bleeding).  Some medicines may interact with Eliquis and might increase your risk of bleeding or clotting while on Eliquis. To help avoid this, consult your healthcare provider or pharmacist prior to using any new prescription or non-prescription medications, including herbals, vitamins, non-steroidal anti-inflammatory drugs (NSAIDs) and supplements.  This website has more information on Eliquis (apixaban): http://www.eliquis.com/eliquis/home

## 2020-01-21 NOTE — Plan of Care (Signed)
  Problem: Clinical Measurements: Goal: Diagnostic test results will improve Outcome: Progressing   

## 2020-01-22 DIAGNOSIS — J189 Pneumonia, unspecified organism: Secondary | ICD-10-CM | POA: Diagnosis not present

## 2020-01-22 DIAGNOSIS — J9601 Acute respiratory failure with hypoxia: Secondary | ICD-10-CM | POA: Diagnosis not present

## 2020-01-22 DIAGNOSIS — A419 Sepsis, unspecified organism: Secondary | ICD-10-CM | POA: Diagnosis not present

## 2020-01-22 DIAGNOSIS — I480 Paroxysmal atrial fibrillation: Secondary | ICD-10-CM | POA: Diagnosis not present

## 2020-01-22 LAB — CBC WITH DIFFERENTIAL/PLATELET
Abs Immature Granulocytes: 0.07 10*3/uL (ref 0.00–0.07)
Basophils Absolute: 0 10*3/uL (ref 0.0–0.1)
Basophils Relative: 0 %
Eosinophils Absolute: 0 10*3/uL (ref 0.0–0.5)
Eosinophils Relative: 0 %
HCT: 33.3 % — ABNORMAL LOW (ref 36.0–46.0)
Hemoglobin: 10.4 g/dL — ABNORMAL LOW (ref 12.0–15.0)
Immature Granulocytes: 1 %
Lymphocytes Relative: 15 %
Lymphs Abs: 1.1 10*3/uL (ref 0.7–4.0)
MCH: 29.1 pg (ref 26.0–34.0)
MCHC: 31.2 g/dL (ref 30.0–36.0)
MCV: 93.3 fL (ref 80.0–100.0)
Monocytes Absolute: 0.5 10*3/uL (ref 0.1–1.0)
Monocytes Relative: 7 %
Neutro Abs: 5.6 10*3/uL (ref 1.7–7.7)
Neutrophils Relative %: 77 %
Platelets: 297 10*3/uL (ref 150–400)
RBC: 3.57 MIL/uL — ABNORMAL LOW (ref 3.87–5.11)
RDW: 14.2 % (ref 11.5–15.5)
WBC: 7.2 10*3/uL (ref 4.0–10.5)
nRBC: 0 % (ref 0.0–0.2)

## 2020-01-22 LAB — COMPREHENSIVE METABOLIC PANEL
ALT: 23 U/L (ref 0–44)
AST: 22 U/L (ref 15–41)
Albumin: 2.6 g/dL — ABNORMAL LOW (ref 3.5–5.0)
Alkaline Phosphatase: 55 U/L (ref 38–126)
Anion gap: 7 (ref 5–15)
BUN: 16 mg/dL (ref 8–23)
CO2: 27 mmol/L (ref 22–32)
Calcium: 8.8 mg/dL — ABNORMAL LOW (ref 8.9–10.3)
Chloride: 108 mmol/L (ref 98–111)
Creatinine, Ser: 0.66 mg/dL (ref 0.44–1.00)
GFR, Estimated: >60 mL/min (ref >60)
Glucose, Bld: 136 mg/dL — ABNORMAL HIGH (ref 70–99)
Potassium: 4.9 mmol/L (ref 3.5–5.1)
Sodium: 142 mmol/L (ref 135–145)
Total Bilirubin: 0.2 mg/dL — ABNORMAL LOW (ref 0.3–1.2)
Total Protein: 5.7 g/dL — ABNORMAL LOW (ref 6.5–8.1)

## 2020-01-22 LAB — PROCALCITONIN: Procalcitonin: 0.52 ng/mL

## 2020-01-22 LAB — PHOSPHORUS: Phosphorus: 4.1 mg/dL (ref 2.5–4.6)

## 2020-01-22 LAB — MAGNESIUM: Magnesium: 2.2 mg/dL (ref 1.7–2.4)

## 2020-01-22 MED ORDER — AZITHROMYCIN 250 MG PO TABS
500.0000 mg | ORAL_TABLET | Freq: Every day | ORAL | Status: AC
  Administered 2020-01-22 – 2020-01-24 (×3): 500 mg via ORAL
  Filled 2020-01-22 (×4): qty 2

## 2020-01-22 MED ORDER — IPRATROPIUM-ALBUTEROL 0.5-2.5 (3) MG/3ML IN SOLN
3.0000 mL | RESPIRATORY_TRACT | Status: DC
  Administered 2020-01-22: 3 mL via RESPIRATORY_TRACT
  Filled 2020-01-22: qty 3

## 2020-01-22 NOTE — Progress Notes (Signed)
PROGRESS NOTE    [name]  CBJ:[id number] DOB: Jun 08, 1931 DOA: 01/19/2020 PCP: Crist Infante, MD     Brief Narrative:  86 y.o. WF PMHx COPD (emphysema), A.Fib on Eliquis following a stroke early Nov, recurrent UTIs on macrodantin.  Worth noting that UA grew out K.Pnemo that was resistant to nitrofurantoin on Nov 5th.  Patient presents to the ED with c/o illness for the past 1 month.  Fever at home.  Generalized weakness.  Recent diagnosis of PNA on 12/18, put on doxycycline.  Took doxycycline without improvement in symptoms.  No abd pain, no vomiting, does have nausea.   ED Course: Satting 80s on RA, improved on 2L via [place].  Is COVID vaccinated.  CXR similar to last time = bibasilar infiltrates.  Tm 101.4, HR 107, WBC 11.6k, lactate 2.2.  COVID and flu neg.  Started on rocephin + azithro.   Subjective: 12/28 afebrile overnight    afebrile overnight A/O x4, positive S OB.  Son states he and family were not aware of COPD until last hospitalization.  Review of CT abdomen pelvis show lower lung emphysematous changes.   Assessment & Plan: Covid vaccination; vaccinated   Principal Problem:   Sepsis due to pneumonia [facility]) Active Problems:   Atrial fibrillation ([place])   Acute respiratory failure with hypoxia ([place])   CAP (community acquired pneumonia)   Severe sepsis -On admission patient met criteria for severe sepsis temp> 38 C, HR> 90, WBC> 12 K, site of infection lungs, lactic acid> 2  Acute respiratory failure with hypoxia/COPD/CAP -Continue current antibiotics x7 days -DuoNeb QID -Incentive spirometry -Flutter valve -Solu-Medrol 60 mg daily -Trend procalcitonin/lactic acid Results for [name], [name] (MRN [id number]) as of 01/21/2020 17:47  Ref. Range 01/20/2020 03:32 01/21/2020 01:38  Procalcitonin Latest Units: ng/mL 0.58 0.80   Results for [name], [name] (MRN [id number]) as of 01/21/2020 17:47  Ref. Range 12/04/2019 09:43 01/19/2020 23:53  01/20/2020 02:34  Lactic Acid, Venous Latest Ref Range: 0.5 - 1.9 mmol/L 1.3 2.2 (HH) 1.6  -12/27 decrease Lactated Ringer's 75 ml/hr -12/27 CT chest W0 contrast to fully evaluate emphysema and fibrosis; extensive right upper lobe emphysema however to me looks more consistent with ILD. -12/28 discussed findings of CT chest with daughter.  Prior to discharge would schedule establish care appointment with PCCM spirometry, DLCO, HRCT chest. -12/28 ambulatory SPO2 pending -12/28 PT/OT consult patient with COPD exacerbation, acute respiratory failure with hypoxia, previous CVA evaluate for CIR vs SNF vs H/H  A. fib -Currently rate controlled without medication -Continue Eliquis     DVT prophylaxis: Eliquis Code Status: Full Family Communication: 12/28 daughter at bedside for discussion of plan of care answered all questions Status is: Inpatient    Dispo: The patient is from: Home              Anticipated d/c is to: Home              Anticipated d/c date is: 12/30              Patient currently unstable      Consultants:    Procedures/Significant Events:  11/25 CT abdomen pelvis W contrast; Emphysematous changes and mild subpleural peripheral fibrosis in the lung bases. Minimal left pleural effusion with atelectasis in the left base. Focal pleural base nodularity in the right lung base appears unchanged since prior study. Cardiac enlargement. Coronary artery calcifications. Small esophageal hiatal hernia. ---------------------------------------------------------------------------------------------------------------------------------- 12/25 PCXR;Unchanged bilateral lung opacities which could reflect infection or edema. Persistent  trace right pleural effusion. 12/27 CT chest W0 contrast;Areas of consolidation in the right upper lobe, superimposed upon extensive background emphysema. Favor acute pneumonia. Follow-up imaging is recommended after appropriate medical management  to document resolution. 2. Borderline enlarged mediastinal lymph nodes, likely reactive. 3. Small bilateral pleural effusions.   I have personally reviewed and interpreted all radiology studies and my findings are as above.  VENTILATOR SETTINGS: Room air 12/28 SPO2 99%    Cultures 12/26 SARS coronavirus negative 12/26 influenza A/B negative 12/26 blood NGTD 12/26 sputum pending 12/27 urine pending   Antimicrobials: Anti-infectives (From admission, onward)   Start     Ordered Stop   01/20/20 2200  cefTRIAXone (ROCEPHIN) 2 g in sodium chloride 0.9 % 100 mL IVPB        01/20/20 0245 01/25/20 2159   01/20/20 2200  azithromycin (ZITHROMAX) 500 mg in sodium chloride 0.9 % 250 mL IVPB        01/20/20 0245 01/25/20 2159   01/20/20 0030  cefTRIAXone (ROCEPHIN) 1 g in sodium chloride 0.9 % 100 mL IVPB        01/20/20 0026 01/20/20 0130   01/20/20 0030  azithromycin (ZITHROMAX) 500 mg in sodium chloride 0.9 % 250 mL IVPB        01/20/20 0026 01/20/20 0203       Devices    LINES / TUBES:      Continuous Infusions: . azithromycin 250 mL/hr at 01/21/20 2157  . cefTRIAXone (ROCEPHIN)  IV 2 g (01/21/20 2152)  . lactated ringers 75 mL/hr at 01/21/20 1906     Objective: Vitals:   01/21/20 0806 01/21/20 1200 01/21/20 1955 01/22/20 0500  BP: 132/88  127/61 130/61  Pulse: 75  74 75  Resp: '18  20 16  ' Temp: 97.8 F (36.6 C)  98.4 F (36.9 C) 98 F (36.7 C)  TempSrc: Oral  Oral Oral  SpO2: 99%  97% 99%  Weight:  61.7 kg      Intake/Output Summary (Last 24 hours) at 01/22/2020 3888 Last data filed at 01/22/2020 0700 Gross per 24 hour  Intake 1101.34 ml  Output 450 ml  Net 651.34 ml   Filed Weights   01/21/20 1200  Weight: 61.7 kg   Physical Exam:  General: A/O x4, positive acute respiratory distress Eyes: negative scleral hemorrhage, negative anisocoria, negative icterus ENT: Negative Runny nose, negative gingival bleeding, Neck:  Negative scars, masses,  torticollis, lymphadenopathy, JVD Lungs: Clear to auscultation bilaterally without wheezes or crackles Cardiovascular: Regular rate and rhythm without murmur gallop or rub normal S1 and S2 Abdomen: negative abdominal pain, nondistended, positive soft, bowel sounds, no rebound, no ascites, no appreciable mass Extremities: No significant cyanosis, clubbing, or edema bilateral lower extremities Skin: Negative rashes, lesions, ulcers Psychiatric:  Negative depression, negative anxiety, negative fatigue, negative mania  Central nervous system:  Cranial nerves II through XII intact, tongue/uvula midline, all extremities muscle strength 5/5, sensation intact throughout, negative dysarthria, negative expressive aphasia, negative receptive aphasia.  .     Data Reviewed: Care during the described time interval was provided by me .  I have reviewed this patient's available data, including medical history, events of note, physical examination, and all test results as part of my evaluation.  CBC: Recent Labs  Lab 01/19/20 2353 01/20/20 0333 01/21/20 0935 01/22/20 0335  WBC 11.6* 13.1* 6.9 7.2  NEUTROABS 10.0*  --  5.4 5.6  HGB 11.5* 10.7* 10.9* 10.4*  HCT 37.7 34.7* 34.1* 33.3*  MCV 93.8 92.8 92.2  93.3  PLT 305 306 286 224   Basic Metabolic Panel: Recent Labs  Lab 01/19/20 2353 01/20/20 0333 01/21/20 0935 01/22/20 0335  NA 142 139 142 142  K 3.9 4.1 3.8 4.9  CL 109 107 105 108  CO2 21* '23 27 27  ' GLUCOSE 122* 119* 145* 136*  BUN '14 11 12 16  ' CREATININE 0.86 0.77 0.85 0.66  CALCIUM 8.5* 8.1* 9.0 8.8*  MG  --   --  2.0 2.2  PHOS  --   --  3.3 4.1   GFR: Estimated Creatinine Clearance: 41.4 mL/min (by C-G formula based on SCr of 0.66 mg/dL). Liver Function Tests: Recent Labs  Lab 01/19/20 2353 01/20/20 0333 01/21/20 0935 01/22/20 0335  AST '29 28 27 22  ' ALT '25 22 23 23  ' ALKPHOS 69 58 58 55  BILITOT 0.9 0.9 0.6 0.2*  PROT 6.2* 5.3* 6.1* 5.7*  ALBUMIN 2.9* 2.5* 2.7* 2.6*    No results for input(s): LIPASE, AMYLASE in the last 168 hours. No results for input(s): AMMONIA in the last 168 hours. Coagulation Profile: Recent Labs  Lab 01/19/20 2353  INR 1.4*   Cardiac Enzymes: No results for input(s): CKTOTAL, CKMB, CKMBINDEX, TROPONINI in the last 168 hours. BNP (last 3 results) No results for input(s): PROBNP in the last 8760 hours. HbA1C: No results for input(s): HGBA1C in the last 72 hours. CBG: No results for input(s): GLUCAP in the last 168 hours. Lipid Profile: No results for input(s): CHOL, HDL, LDLCALC, TRIG, CHOLHDL, LDLDIRECT in the last 72 hours. Thyroid Function Tests: No results for input(s): TSH, T4TOTAL, FREET4, T3FREE, THYROIDAB in the last 72 hours. Anemia Panel: No results for input(s): VITAMINB12, FOLATE, FERRITIN, TIBC, IRON, RETICCTPCT in the last 72 hours. Sepsis Labs: Recent Labs  Lab 01/19/20 2353 01/20/20 0234 01/20/20 0332 01/21/20 0138 01/22/20 0335  PROCALCITON  --   --  0.58 0.80 0.52  LATICACIDVEN 2.2* 1.6  --   --   --     Recent Results (from the past 240 hour(s))  Blood culture (routine single)     Status: None (Preliminary result)   Collection Time: 01/20/20 12:01 AM   Specimen: BLOOD  Result Value Ref Range Status   Specimen Description BLOOD SITE NOT SPECIFIED  Final   Special Requests   Final    BOTTLES DRAWN AEROBIC AND ANAEROBIC Blood Culture results may not be optimal due to an inadequate volume of blood received in culture bottles   Culture   Final    NO GROWTH 1 DAY Performed at [facility], [place] [address], [place], [place] [address]    Report Status PENDING  Incomplete  Resp Panel by RT-PCR (Flu A&B, Covid) Nasopharyngeal Swab     Status: None   Collection Time: 01/20/20 12:07 AM   Specimen: Nasopharyngeal Swab; Nasopharyngeal(NP) swabs in vial transport medium  Result Value Ref Range Status   SARS Coronavirus 2 by RT PCR NEGATIVE NEGATIVE Final    Comment: (NOTE) SARS-CoV-2 target  nucleic acids are NOT DETECTED.  The SARS-CoV-2 RNA is generally detectable in upper respiratory specimens during the acute phase of infection. The lowest concentration of SARS-CoV-2 viral copies this assay can detect is 138 copies/mL. A negative result does not preclude SARS-Cov-2 infection and should not be used as the sole basis for treatment or other patient management decisions. A negative result may occur with  improper specimen collection/handling, submission of specimen other than nasopharyngeal swab, presence of viral mutation(s) within the areas targeted  by this assay, and inadequate number of viral copies(<138 copies/mL). A negative result must be combined with clinical observations, patient history, and epidemiological information. The expected result is Negative.  Fact Sheet for Patients:  EntrepreneurPulse.com.au  Fact Sheet for Healthcare Providers:  IncredibleEmployment.be  This test is no t yet approved or cleared by the [place] FDA and  has been authorized for detection and/or diagnosis of SARS-CoV-2 by FDA under an Emergency Use Authorization (EUA). This EUA will remain  in effect (meaning this test can be used) for the duration of the COVID-19 declaration under Section 564(b)(1) of the Act, 21 U.S.C.section 360bbb-3(b)(1), unless the authorization is terminated  or revoked sooner.       Influenza A by PCR NEGATIVE NEGATIVE Final   Influenza B by PCR NEGATIVE NEGATIVE Final    Comment: (NOTE) The Xpert Xpress SARS-CoV-2/FLU/RSV plus assay is intended as an aid in the diagnosis of influenza from Nasopharyngeal swab specimens and should not be used as a sole basis for treatment. Nasal washings and aspirates are unacceptable for Xpert Xpress SARS-CoV-2/FLU/RSV testing.  Fact Sheet for Patients: EntrepreneurPulse.com.au  Fact Sheet for Healthcare  Providers: IncredibleEmployment.be  This test is not yet approved or cleared by the [place] FDA and has been authorized for detection and/or diagnosis of SARS-CoV-2 by FDA under an Emergency Use Authorization (EUA). This EUA will remain in effect (meaning this test can be used) for the duration of the COVID-19 declaration under Section 564(b)(1) of the Act, 21 U.S.C. section 360bbb-3(b)(1), unless the authorization is terminated or revoked.  Performed at [facility], [place] [address], [place], [place] [address]          Radiology Studies: CT CHEST WO CONTRAST  Result Date: 01/21/2020 CLINICAL DATA:  COPD exacerbation, abnormal chest x-ray EXAM: CT CHEST WITHOUT CONTRAST TECHNIQUE: Multidetector CT imaging of the chest was performed following the standard protocol without IV contrast. COMPARISON:  01/20/2020, 11/14/2019, December 05, 2019 FINDINGS: Cardiovascular: The heart is unremarkable without pericardial effusion. Normal caliber of the thoracic aorta. Mild atherosclerosis of the aorta and coronary vasculature. Mediastinum/Nodes: Borderline enlarged mediastinal lymph nodes are seen, largest measuring 12 mm in short axis right paratracheal region image 38/3. Thyroid, trachea, and esophagus are unremarkable. Lungs/Pleura: Upper lobe predominant emphysema is noted, with bullous changes at the apices, right greater than left. Stable areas of consolidation within the periphery of the left apex are compatible with scarring. There is an area of right upper lobe consolidation new since prior CT Dec 05, 2019, most pronounced on images 27-33 of series 3, which could reflect superimposed infection. There are trace bilateral pleural effusions. No pneumothorax. Central airways are patent. Upper Abdomen: No acute abnormality. Musculoskeletal: There are no acute or destructive bony lesions. Reconstructed images demonstrate no additional findings. IMPRESSION: 1. Areas of consolidation  in the right upper lobe, superimposed upon extensive background emphysema. Favor acute pneumonia. Follow-up imaging is recommended after appropriate medical management to document resolution. 2. Borderline enlarged mediastinal lymph nodes, likely reactive. 3. Small bilateral pleural effusions. 4. Aortic Atherosclerosis (ICD10-I70.0) and Emphysema (ICD10-J43.9). Electronically Signed   By: Randa Ngo M.D.   On: 01/21/2020 19:06        Scheduled Meds: . apixaban  5 mg Oral BID  . atorvastatin  10 mg Oral QHS  . cholecalciferol  2,000 Units Oral QHS  . feeding supplement  237 mL Oral TID BM  . methylPREDNISolone (SOLU-MEDROL) injection  60 mg Intravenous Q24H  . mirtazapine  15 mg Oral QHS  .  multivitamin with minerals  1 tablet Oral Daily   Continuous Infusions: . azithromycin 250 mL/hr at 01/21/20 2157  . cefTRIAXone (ROCEPHIN)  IV 2 g (01/21/20 2152)  . lactated ringers 75 mL/hr at 01/21/20 1906     LOS: 2 days    Time spent:40 min    Romello Hoehn, Geraldo Docker, MD [facility] Pager [phone]  If 7PM-7AM, please contact night-coverage [email or web address] Password [facility] 01/22/2020, 8:54 AM

## 2020-01-22 NOTE — Plan of Care (Signed)
  Problem: Education: Goal: Knowledge of General Education information will improve Description: Including pain rating scale, medication(s)/side effects and non-pharmacologic comfort measures Outcome: Progressing   Problem: Health Behavior/Discharge Planning: Goal: Ability to manage health-related needs will improve Outcome: Progressing   Problem: Clinical Measurements: Goal: Ability to maintain clinical measurements within normal limits will improve Outcome: Progressing Goal: Will remain free from infection Outcome: Progressing Goal: Diagnostic test results will improve Outcome: Progressing Goal: Respiratory complications will improve Outcome: Progressing Goal: Cardiovascular complication will be avoided Outcome: Progressing   Problem: Activity: Goal: Risk for activity intolerance will decrease Outcome: Progressing   Problem: Nutrition: Goal: Adequate nutrition will be maintained Outcome: Progressing   Problem: Coping: Goal: Level of anxiety will decrease Outcome: Progressing   Problem: Elimination: Goal: Will not experience complications related to bowel motility Outcome: Progressing Goal: Will not experience complications related to urinary retention Outcome: Progressing   Problem: Pain Managment: Goal: General experience of comfort will improve Outcome: Progressing   Problem: Clinical Measurements: Goal: Diagnostic test results will improve Outcome: Progressing Goal: Signs and symptoms of infection will decrease Outcome: Progressing

## 2020-01-22 NOTE — Plan of Care (Signed)
  Problem: Activity: Goal: Ability to tolerate increased activity will improve Outcome: Progressing   Problem: Clinical Measurements: Goal: Ability to maintain a body temperature in the normal range will improve Outcome: Progressing   Problem: Respiratory: Goal: Ability to maintain adequate ventilation will improve Outcome: Progressing Goal: Ability to maintain a clear airway will improve Outcome: Progressing   

## 2020-01-23 DIAGNOSIS — A419 Sepsis, unspecified organism: Secondary | ICD-10-CM | POA: Diagnosis not present

## 2020-01-23 DIAGNOSIS — J189 Pneumonia, unspecified organism: Secondary | ICD-10-CM | POA: Diagnosis not present

## 2020-01-23 LAB — COMPREHENSIVE METABOLIC PANEL
ALT: 21 U/L (ref 0–44)
AST: 20 U/L (ref 15–41)
Albumin: 2.5 g/dL — ABNORMAL LOW (ref 3.5–5.0)
Alkaline Phosphatase: 50 U/L (ref 38–126)
Anion gap: 8 (ref 5–15)
BUN: 16 mg/dL (ref 8–23)
CO2: 27 mmol/L (ref 22–32)
Calcium: 8.8 mg/dL — ABNORMAL LOW (ref 8.9–10.3)
Chloride: 106 mmol/L (ref 98–111)
Creatinine, Ser: 0.7 mg/dL (ref 0.44–1.00)
GFR, Estimated: >60 mL/min (ref >60)
Glucose, Bld: 144 mg/dL — ABNORMAL HIGH (ref 70–99)
Potassium: 4.5 mmol/L (ref 3.5–5.1)
Sodium: 141 mmol/L (ref 135–145)
Total Bilirubin: 0.2 mg/dL — ABNORMAL LOW (ref 0.3–1.2)
Total Protein: 5.6 g/dL — ABNORMAL LOW (ref 6.5–8.1)

## 2020-01-23 LAB — CBC WITH DIFFERENTIAL/PLATELET
Abs Immature Granulocytes: 0.13 10*3/uL — ABNORMAL HIGH (ref 0.00–0.07)
Basophils Absolute: 0 10*3/uL (ref 0.0–0.1)
Basophils Relative: 0 %
Eosinophils Absolute: 0 10*3/uL (ref 0.0–0.5)
Eosinophils Relative: 0 %
HCT: 32.5 % — ABNORMAL LOW (ref 36.0–46.0)
Hemoglobin: 10.2 g/dL — ABNORMAL LOW (ref 12.0–15.0)
Immature Granulocytes: 2 %
Lymphocytes Relative: 12 %
Lymphs Abs: 1.1 10*3/uL (ref 0.7–4.0)
MCH: 29.4 pg (ref 26.0–34.0)
MCHC: 31.4 g/dL (ref 30.0–36.0)
MCV: 93.7 fL (ref 80.0–100.0)
Monocytes Absolute: 0.5 10*3/uL (ref 0.1–1.0)
Monocytes Relative: 6 %
Neutro Abs: 6.9 10*3/uL (ref 1.7–7.7)
Neutrophils Relative %: 80 %
Platelets: 360 10*3/uL (ref 150–400)
RBC: 3.47 MIL/uL — ABNORMAL LOW (ref 3.87–5.11)
RDW: 14.5 % (ref 11.5–15.5)
WBC: 8.7 10*3/uL (ref 4.0–10.5)
nRBC: 0 % (ref 0.0–0.2)

## 2020-01-23 LAB — PHOSPHORUS: Phosphorus: 3.7 mg/dL (ref 2.5–4.6)

## 2020-01-23 LAB — MAGNESIUM: Magnesium: 2.1 mg/dL (ref 1.7–2.4)

## 2020-01-23 MED ORDER — METHYLPREDNISOLONE SODIUM SUCC 40 MG IJ SOLR
40.0000 mg | INTRAMUSCULAR | Status: DC
  Administered 2020-01-24: 40 mg via INTRAVENOUS
  Filled 2020-01-23: qty 1

## 2020-01-23 MED ORDER — SACCHAROMYCES BOULARDII 250 MG PO CAPS
250.0000 mg | ORAL_CAPSULE | Freq: Two times a day (BID) | ORAL | Status: DC
  Administered 2020-01-23 – 2020-01-25 (×4): 250 mg via ORAL
  Filled 2020-01-23 (×4): qty 1

## 2020-01-23 MED ORDER — AMOXICILLIN 500 MG PO CAPS
500.0000 mg | ORAL_CAPSULE | Freq: Three times a day (TID) | ORAL | Status: DC
  Administered 2020-01-23 – 2020-01-24 (×3): 500 mg via ORAL
  Filled 2020-01-23 (×5): qty 1

## 2020-01-23 NOTE — Care Management Important Message (Signed)
Important Message  Patient Details  Name: [name] MRN: [id number] Date of Birth: March 06, 1931   Medicare Important Message Given:  Yes     Dorena Bodo 01/23/2020, 2:52 PM

## 2020-01-23 NOTE — Progress Notes (Addendum)
PROGRESS NOTE  [name] [id number] DOB: [birth year] DOA: 01/19/2020 PCP: Crist Infante, MD  HPI/Recap of past 24 hours: 86 y.o.WF PMHx COPD (emphysema), A.Fib on Eliquis following a stroke early Nov, recurrent UTIs on macrodantin. Worth noting that UA grew out K.Pnemo that was resistant to nitrofurantoin on Nov 5th.  Patient presents to the ED with c/o illness for the past 1 month. Fever at home. Generalized weakness. Recent diagnosis of PNA on 12/18, put on doxycycline. Took doxycycline without improvement in symptoms.  ED Course:Satting 80s on RA, improved on 2L via [place].  Is COVID vaccinated.  CXR similar to last time = bibasilar infiltrates.  Tm 101.4, HR 107, WBC 11.6k, lactate 2.2.  COVID and flu neg.  01/23/20: Seen and examined with her daughter at bedside.  She reports persistent nonproductive cough.  Denies any chest pain.   Assessment/Plan: Principal Problem:   Sepsis due to pneumonia [facility]) Active Problems:   Atrial fibrillation ([place])   Acute respiratory failure with hypoxia (HCC)   CAP (community acquired pneumonia)  Severe sepsis secondary to community-acquired pneumonia and Enterococcus faecalis/Klebsiella pneumonia UTI, POA Presented with fever, tachycardia, leukocytosis and evidence of pneumonia on chest x-ray with bilateral pulmonary infiltrates, personally reviewed. She is currently on day 4 out of 5 of Rocephin and azithromycin. Procalcitonin downtrending, from 0.8 to 0.5.  Acute hypoxic respiratory failure secondary to above. Normal oxygen supplementation at baseline Currently on 2 L to maintain O2 saturation greater than 90% Wean off oxygen supplementation as tolerated Home oxygen evaluation prior to DC She is on IV Solu-Medrol 60 mg daily, wean off as tolerated.  Enterococcus faecalis/Klebsiella pneumoniae UTI, POA She has a history of recurrent UTI Continue Rocephin She is afebrile with no leukocytosis Add amoxicillin and  Florastor Follow sensitivities and adjust antibiotics accordingly.  Chronic A. fib Not on any rate control agent Continue Eliquis for CVA prevention.  Generalized weakness PT to assess Fall precautions   Code Status: Full code  Family Communication: Updated her daughter at bedside  Disposition Plan: Likely will DC to home with home health services.   Consultants: None.  Procedures:  None.  Antimicrobials:  Rocephin  Azithromycin  Amoxicillin started on 01/23/2020.  DVT prophylaxis: Eliquis.  Status is: Inpatient    Dispo: The patient is from: Home.               Anticipated d/c is to: Home with home health services.               Anticipated d/c date is: 01/24/2020.              Patient currently not stable for discharge, ongoing treatment of Enterococcus faecalis/Klebsiella pneumonia UTI.       Objective: Vitals:   01/22/20 1907 01/22/20 2013 01/23/20 0544 01/23/20 1153  BP:  134/64 (!) 178/81 (!) 144/87  Pulse:  60 62 (!) 59  Resp:  18 18 18   Temp:  98.8 F (37.1 C) 98.2 F (36.8 C) 98.5 F (36.9 C)  TempSrc:  Oral Oral Oral  SpO2: 95% 100% (!) 87% 96%  Weight:        Intake/Output Summary (Last 24 hours) at 01/23/2020 1627 Last data filed at 01/23/2020 1010 Gross per 24 hour  Intake 240 ml  Output --  Net 240 ml   Filed Weights   01/21/20 1200  Weight: 61.7 kg    Exam:  . General: 86 y.o. year-old female well developed well nourished in no acute distress.  Alert and pleasant. . Cardiovascular: Irregular rate and rhythm with no rubs or gallops.  No thyromegaly or JVD noted.   Marland Kitchen Respiratory: Mild rales at bases no wheezing noted.  Poor inspiratory effort.   . Abdomen: Soft nontender nondistended with normal bowel sounds x4 quadrants. . Musculoskeletal: No lower extremity edema. 2/4 pulses in all 4 extremities. Marland Kitchen Psychiatry: Mood is appropriate for condition and setting   Data Reviewed: CBC: Recent Labs  Lab 01/19/20 2353  01/20/20 0333 01/21/20 0935 01/22/20 0335 01/23/20 0319  WBC 11.6* 13.1* 6.9 7.2 8.7  NEUTROABS 10.0*  --  5.4 5.6 6.9  HGB 11.5* 10.7* 10.9* 10.4* 10.2*  HCT 37.7 34.7* 34.1* 33.3* 32.5*  MCV 93.8 92.8 92.2 93.3 93.7  PLT 305 306 286 297 360   Basic Metabolic Panel: Recent Labs  Lab 01/19/20 2353 01/20/20 0333 01/21/20 0935 01/22/20 0335 01/23/20 0319  NA 142 139 142 142 141  K 3.9 4.1 3.8 4.9 4.5  CL 109 107 105 108 106  CO2 21* 23 27 27 27   GLUCOSE 122* 119* 145* 136* 144*  BUN 14 11 12 16 16   CREATININE 0.86 0.77 0.85 0.66 0.70  CALCIUM 8.5* 8.1* 9.0 8.8* 8.8*  MG  --   --  2.0 2.2 2.1  PHOS  --   --  3.3 4.1 3.7   GFR: Estimated Creatinine Clearance: 41.4 mL/min (by C-G formula based on SCr of 0.7 mg/dL). Liver Function Tests: Recent Labs  Lab 01/19/20 2353 01/20/20 0333 01/21/20 0935 01/22/20 0335 01/23/20 0319  AST 29 28 27 22 20   ALT 25 22 23 23 21   ALKPHOS 69 58 58 55 50  BILITOT 0.9 0.9 0.6 0.2* 0.2*  PROT 6.2* 5.3* 6.1* 5.7* 5.6*  ALBUMIN 2.9* 2.5* 2.7* 2.6* 2.5*   No results for input(s): LIPASE, AMYLASE in the last 168 hours. No results for input(s): AMMONIA in the last 168 hours. Coagulation Profile: Recent Labs  Lab 01/19/20 2353  INR 1.4*   Cardiac Enzymes: No results for input(s): CKTOTAL, CKMB, CKMBINDEX, TROPONINI in the last 168 hours. BNP (last 3 results) No results for input(s): PROBNP in the last 8760 hours. HbA1C: No results for input(s): HGBA1C in the last 72 hours. CBG: No results for input(s): GLUCAP in the last 168 hours. Lipid Profile: No results for input(s): CHOL, HDL, LDLCALC, TRIG, CHOLHDL, LDLDIRECT in the last 72 hours. Thyroid Function Tests: No results for input(s): TSH, T4TOTAL, FREET4, T3FREE, THYROIDAB in the last 72 hours. Anemia Panel: No results for input(s): VITAMINB12, FOLATE, FERRITIN, TIBC, IRON, RETICCTPCT in the last 72 hours. Urine analysis:    Component Value Date/Time   COLORURINE YELLOW  01/21/2020 2000   APPEARANCEUR CLOUDY (A) 01/21/2020 2000   LABSPEC 1.012 01/21/2020 2000   PHURINE 5.0 01/21/2020 2000   GLUCOSEU NEGATIVE 01/21/2020 2000   HGBUR NEGATIVE 01/21/2020 2000   BILIRUBINUR NEGATIVE 01/21/2020 2000   KETONESUR NEGATIVE 01/21/2020 2000   PROTEINUR NEGATIVE 01/21/2020 2000   NITRITE NEGATIVE 01/21/2020 2000   LEUKOCYTESUR NEGATIVE 01/21/2020 2000   Sepsis Labs: @LABRCNTIP (procalcitonin:4,lacticidven:4)  ) Recent Results (from the past 240 hour(s))  Blood culture (routine single)     Status: None (Preliminary result)   Collection Time: 01/20/20 12:01 AM   Specimen: BLOOD  Result Value Ref Range Status   Specimen Description BLOOD SITE NOT SPECIFIED  Final   Special Requests   Final    BOTTLES DRAWN AEROBIC AND ANAEROBIC Blood Culture results may not be optimal due to an inadequate  volume of blood received in culture bottles   Culture   Final    NO GROWTH 3 DAYS Performed at [facility], [place] [address], [place], [place] [address]    Report Status PENDING  Incomplete  Resp Panel by RT-PCR (Flu A&B, Covid) Nasopharyngeal Swab     Status: None   Collection Time: 01/20/20 12:07 AM   Specimen: Nasopharyngeal Swab; Nasopharyngeal(NP) swabs in vial transport medium  Result Value Ref Range Status   SARS Coronavirus 2 by RT PCR NEGATIVE NEGATIVE Final    Comment: (NOTE) SARS-CoV-2 target nucleic acids are NOT DETECTED.  The SARS-CoV-2 RNA is generally detectable in upper respiratory specimens during the acute phase of infection. The lowest concentration of SARS-CoV-2 viral copies this assay can detect is 138 copies/mL. A negative result does not preclude SARS-Cov-2 infection and should not be used as the sole basis for treatment or other patient management decisions. A negative result may occur with  improper specimen collection/handling, submission of specimen other than nasopharyngeal swab, presence of viral mutation(s) within the areas targeted  by this assay, and inadequate number of viral copies(<138 copies/mL). A negative result must be combined with clinical observations, patient history, and epidemiological information. The expected result is Negative.  Fact Sheet for Patients:  EntrepreneurPulse.com.au  Fact Sheet for Healthcare Providers:  IncredibleEmployment.be  This test is no t yet approved or cleared by the [place] FDA and  has been authorized for detection and/or diagnosis of SARS-CoV-2 by FDA under an Emergency Use Authorization (EUA). This EUA will remain  in effect (meaning this test can be used) for the duration of the COVID-19 declaration under Section 564(b)(1) of the Act, 21 U.S.C.section 360bbb-3(b)(1), unless the authorization is terminated  or revoked sooner.       Influenza A by PCR NEGATIVE NEGATIVE Final   Influenza B by PCR NEGATIVE NEGATIVE Final    Comment: (NOTE) The Xpert Xpress SARS-CoV-2/FLU/RSV plus assay is intended as an aid in the diagnosis of influenza from Nasopharyngeal swab specimens and should not be used as a sole basis for treatment. Nasal washings and aspirates are unacceptable for Xpert Xpress SARS-CoV-2/FLU/RSV testing.  Fact Sheet for Patients: EntrepreneurPulse.com.au  Fact Sheet for Healthcare Providers: IncredibleEmployment.be  This test is not yet approved or cleared by the [place] FDA and has been authorized for detection and/or diagnosis of SARS-CoV-2 by FDA under an Emergency Use Authorization (EUA). This EUA will remain in effect (meaning this test can be used) for the duration of the COVID-19 declaration under Section 564(b)(1) of the Act, 21 U.S.C. section 360bbb-3(b)(1), unless the authorization is terminated or revoked.  Performed at [facility], [place] [address] [place], [place] [address]   Urine culture     Status: Abnormal (Preliminary result)   Collection Time:  01/21/20  8:02 PM   Specimen: In/Out Cath Urine  Result Value Ref Range Status   Specimen Description IN/OUT CATH URINE  Final   Special Requests   Final    NONE Performed at [facility], [place] [address] [place], [place] [address]    Culture (A)  Final    >=100,000 COLONIES/mL ENTEROCOCCUS FAECALIS 10,000 COLONIES/mL KLEBSIELLA PNEUMONIAE    Report Status PENDING  Incomplete      Studies: No results found.  Scheduled Meds: . apixaban  5 mg Oral BID  . atorvastatin  10 mg Oral QHS  . azithromycin  500 mg Oral QHS  . cholecalciferol  2,000 Units Oral QHS  .  feeding supplement  237 mL Oral TID BM  . methylPREDNISolone (SOLU-MEDROL) injection  60 mg Intravenous Q24H  . mirtazapine  15 mg Oral QHS  . multivitamin with minerals  1 tablet Oral Daily    Continuous Infusions: . cefTRIAXone (ROCEPHIN)  IV Stopped (01/22/20 2237)  . lactated ringers 30 mL/hr at 01/23/20 1049     LOS: 3 days     Kayleen Memos, MD [facility] Pager [phone]  If 7PM-7AM, please contact night-coverage [email or web address] Password [facility] 01/23/2020, 4:27 PM

## 2020-01-23 NOTE — Progress Notes (Addendum)
OT Cancellation Note  Patient Details Name: NAELANI LAFRANCE MRN: [id number] DOB: 06-Sep-1931   Cancelled Treatment:    Reason Eval/Treat Not Completed: Patient declined, no reason specified;Other (comment) (Pt's daughter in room and explained that pt had a choking episode earlier this AM and walked to the bathroom and is "so worn out." OTR to check on pt next available treatment time.)   Checked on pt again before 5pm and pt had just walked with PT and was getting washed up. Pt/daughter politely declined.  Flora Lipps, OTR/L Acute Rehabilitation Services Pager: [phone] Office: [phone]   Jerilynn Birkenhead 01/23/2020, 12:26 PM

## 2020-01-23 NOTE — Evaluation (Signed)
Physical Therapy Evaluation Patient Details Name: [name] MRN: [id number] DOB: Nov 22, 1931 Today's Date: 01/23/2020   History of Present Illness  86 yo female with onset of fever and weakness at home was brought to ED, now admitted with noted pleural effusion, low O2 sats, recent CVA and UTI.  Pt has sepsis PNA, hoping to go home with family to manage mobility and her self care.  PMHx:  atherosclerosis, emphysema, a-fib, stroke, UTI's, chest pain, EF 77%, falls, bladder repair, R achilles tendonitis, sciatica  Clinical Impression  Pt was seen for mobility on RW with tolerance for gait but desaturating on less than 3L O2.  With 2L was down to 87% but on 3L could stay in 90% or higher range.  Follow up with her for plan to have HHPT see her and then with outpatient services.  Pt is moderately incoordinated on LLE as well and with strength differences has work to do for increasing safety and independence of gait and transfers, as well as reducing energy use and O2 consumption for movement.     Follow Up Recommendations Home health PT;Supervision for mobility/OOB    Equipment Recommendations  Rolling walker with 5" wheels (if hers is not in good repair)    Recommendations for Other Services       Precautions / Restrictions Precautions Precautions: Fall Precaution Comments: monitor O2 sats Restrictions Weight Bearing Restrictions: No Other Position/Activity Restrictions: requires O2 for mobility      Mobility  Bed Mobility Overal bed mobility: Needs Assistance Bed Mobility: Supine to Sit;Sit to Supine     Supine to sit: Min assist Sit to supine: Min assist   General bed mobility comments: min assist to maneuver on bed including scooting    Transfers Overall transfer level: Needs assistance Equipment used: Rolling walker (2 wheeled);1 person hand held assist Transfers: Sit to/from Stand Sit to Stand: Min assist         General transfer comment: min assist with power  up  Ambulation/Gait Ambulation/Gait assistance: Min guard;Min assist Gait Distance (Feet): 80 Feet Assistive device: Rolling walker (2 wheeled);1 person hand held assist Gait Pattern/deviations: Step-through pattern;Decreased stride length;Wide base of support;Shuffle Gait velocity: reduced Gait velocity interpretation: <1.31 ft/sec, indicative of household ambulator General Gait Details: limited energy to invest in gait but demonstrates tolerance with O2 on at 2L then 3L, sustained at 90+% after increase O2.  Stairs            Wheelchair Mobility    Modified Rankin (Stroke Patients Only) Modified Rankin (Stroke Patients Only) Pre-Morbid Rankin Score: Slight disability Modified Rankin: Moderately severe disability     Balance Overall balance assessment: Needs assistance Sitting-balance support: Feet supported Sitting balance-Leahy Scale: Fair     Standing balance support: Bilateral upper extremity supported;During functional activity Standing balance-Leahy Scale: Poor                               Pertinent Vitals/Pain Pain Assessment: No/denies pain    Home Living Family/patient expects to be discharged to:: Private residence Living Arrangements: Spouse/significant other Available Help at Discharge: Available 24 hours/day;Family Type of Home: House Home Access: Stairs to enter Entrance Stairs-Rails:  (grab bar on R) Entrance Stairs-Number of Steps: 1+1 Home Layout: Two level;Able to live on main level with bedroom/bathroom Home Equipment: Gilmer Mor - single point;Walker - 2 wheels (grab bars at stairs)      Prior Function Level of Independence: Independent with assistive  device(s)         Comments: pt is on [facility] and able to do her own self care     Hand Dominance   Dominant Hand: Right    Extremity/Trunk Assessment   Upper Extremity Assessment Upper Extremity Assessment: Overall WFL for tasks assessed    Lower Extremity Assessment Lower  Extremity Assessment: Generalized weakness;LLE deficits/detail LLE Deficits / Details: L hip 4- abd and hams LLE Coordination: decreased gross motor    Cervical / Trunk Assessment Cervical / Trunk Assessment: Kyphotic (mild)  Communication   Communication: No difficulties  Cognition Arousal/Alertness: Awake/alert Behavior During Therapy: WFL for tasks assessed/performed Overall Cognitive Status: Within Functional Limits for tasks assessed                                        General Comments General comments (skin integrity, edema, etc.): Pt was assisted to walk with RW on O2 after nursing determined pt would desaturate on room air    Exercises     Assessment/Plan    PT Assessment Patient needs continued PT services  PT Problem List Decreased strength;Decreased range of motion;Decreased activity tolerance;Decreased balance;Decreased mobility;Decreased coordination;Decreased cognition;Decreased knowledge of use of DME;Decreased safety awareness;Decreased knowledge of precautions;Cardiopulmonary status limiting activity       PT Treatment Interventions DME instruction;Gait training;Stair training;Functional mobility training;Therapeutic activities;Therapeutic exercise;Balance training;Neuromuscular re-education;Patient/family education    PT Goals (Current goals can be found in the Care Plan section)  Acute Rehab PT Goals Patient Stated Goal: to walk and get home PT Goal Formulation: With patient/family Time For Goal Achievement: 02/06/20 Potential to Achieve Goals: Good    Frequency Min 3X/week   Barriers to discharge Inaccessible home environment;Decreased caregiver support desaturates to walk on less than 3L O2    Co-evaluation               AM-PAC PT "6 Clicks" Mobility  Outcome Measure Help needed turning from your back to your side while in a flat bed without using bedrails?: A Little Help needed moving from lying on your back to sitting on  the side of a flat bed without using bedrails?: A Little Help needed moving to and from a bed to a chair (including a wheelchair)?: A Little Help needed standing up from a chair using your arms (e.g., wheelchair or bedside chair)?: A Little Help needed to walk in hospital room?: A Little Help needed climbing 3-5 steps with a railing? : Total 6 Click Score: 16    End of Session Equipment Utilized During Treatment: Gait belt;Oxygen Activity Tolerance: Patient tolerated treatment well;Treatment limited secondary to medical complications (Comment) Patient left: in bed;with call bell/phone within reach;with bed alarm set;with nursing/sitter in room;with family/visitor present Nurse Communication: Mobility status PT Visit Diagnosis: Unsteadiness on feet (R26.81);Muscle weakness (generalized) (M62.81);Difficulty in walking, not elsewhere classified (R26.2);Adult, failure to thrive (R62.7);Hemiplegia and hemiparesis Hemiplegia - Right/Left: Left Hemiplegia - dominant/non-dominant: Non-dominant Hemiplegia - caused by: Unspecified    Time: [id number] PT Time Calculation (min) (ACUTE ONLY): 37 min   Charges:   PT Evaluation $PT Eval Moderate Complexity: 1 Mod PT Treatments $Gait Training: 8-22 mins       Ramond Dial 01/23/2020, 8:24 PM  Mee Hives, PT MS Acute Rehab Dept. Number: [place] and [place]

## 2020-01-23 NOTE — Evaluation (Signed)
Clinical/Bedside Swallow Evaluation Patient Details  Name: [name] MRN: [id number] Date of Birth: January 10, 1932  Today's Date: 01/23/2020 Time: SLP Start Time (ACUTE ONLY): 45 SLP Stop Time (ACUTE ONLY): 1525 SLP Time Calculation (min) (ACUTE ONLY): 15 min  Past Medical History:  Past Medical History:  Diagnosis Date  . Atrial fibrillation ([place])    Prior treatment with amiodarone, held sinus rhythm and drug stopped  . Atrial flutter ([place])    Status post ablation prior to  2009  . Chest pain    Nuclear, 2004, no scar or ischemia, EF 77%  . Ejection fraction    EF 70%, nuclear,  2004  //     . Fall    July, 2014  . H/O bladder repair surgery   . Right Achilles tendinitis   . Sciatica    Past Surgical History:  Past Surgical History:  Procedure Laterality Date  . IR CT HEAD LTD  11/14/2019  . IR PERCUTANEOUS ART THROMBECTOMY/INFUSION INTRACRANIAL INC DIAG ANGIO  11/14/2019  . IR US GUIDE VASC ACCESS RIGHT  11/14/2019  . no surgical hx    . RADIOLOGY WITH ANESTHESIA N/A 11/13/2019   Procedure: IR WITH ANESTHESIA;  Surgeon: Luanne Bras, MD;  Location: [place];  Service: Radiology;  Laterality: N/A;   HPI:  Pt is an 86 y.o. female with medical history significant for A.Fib, CVA, and recurrent UTIs. She was admitted secondary to generalized weakness and diagnosed with sepsis secondary to pneumonia. CT chest 12/27: Areas of consolidation in the right upper lobe, superimposed upon extensive background emphysema. Favor acute pneumonia. SLP consulted with note, "patient's family have noticed patient is pocketing food in her mouth and having difficulty swallowing".Pt received SLP services in November at [facility] for cognition.   Assessment / Plan / Recommendation Clinical Impression  Pt was seen for bedside swallow evaluation with her daughter, [name], present. Pt's daughter and RN reported the pt tolerated her smaller pills without overt s/sx of aspiration, but had a coughing  episode with a multivitamin which was taken with water. Pt's daughter indicated that the pt inconsistently "holds liquids for a little longer", but both parties denied signs of aspiration with meals. Oral mechanism exam was [facility] and dentition was adequate. She tolerated all solids and liquids without signs or symptoms of oropharyngeal dysphagia. It is recommended that her current diet of regular texture solids with thin liquids be continued and that larger pills be given whole/crushed in puree. SLP will follow to assess diet tolerance. SLP Visit Diagnosis: Dysphagia, unspecified (R13.10)    Aspiration Risk  Mild aspiration risk    Diet Recommendation Regular;Thin liquid   Liquid Administration via: Cup;Straw Medication Administration: Whole meds with puree (crush larger pills) Supervision: Staff to assist with self feeding Compensations: Slow rate;Small sips/bites Postural Changes: Seated upright at 90 degrees    Other  Recommendations Oral Care Recommendations: Oral care BID   Follow up Recommendations None      Frequency and Duration min 1 x/week  1 week       Prognosis Prognosis for Safe Diet Advancement: Good      Swallow Study   General Date of Onset: 01/22/20 HPI: Pt is an 86 y.o. female with medical history significant for A.Fib, CVA, and recurrent UTIs. She was admitted secondary to generalized weakness and diagnosed with sepsis secondary to pneumonia. CT chest 12/27: Areas of consolidation in the right upper lobe, superimposed upon extensive background emphysema. Favor acute pneumonia. SLP consulted with note, "patient's family have  noticed patient is pocketing food in her mouth and having difficulty swallowing".Pt received SLP services in November at [facility] for cognition. Type of Study: Bedside Swallow Evaluation Previous Swallow Assessment: None Diet Prior to this Study: Regular;Thin liquids Temperature Spikes Noted: No Respiratory Status: Nasal cannula History of Recent  Intubation: No Behavior/Cognition: Alert;Cooperative;Pleasant mood Oral Cavity Assessment: Within Functional Limits Oral Care Completed by SLP: No Oral Cavity - Dentition: Adequate natural dentition Vision: Functional for self-feeding Self-Feeding Abilities: Needs assist Patient Positioning: Upright in bed;Postural control adequate for testing Baseline Vocal Quality: Normal Volitional Cough: Strong Volitional Swallow: Able to elicit    Oral/Motor/Sensory Function Overall Oral Motor/Sensory Function: Within functional limits   Ice Chips Ice chips: Within functional limits Presentation: Spoon   Thin Liquid Thin Liquid: Within functional limits Presentation: Spoon    Nectar Thick Nectar Thick Liquid: Not tested   Honey Thick Honey Thick Liquid: Not tested   Puree Puree: Within functional limits Presentation: Spoon   Solid     Solid: Within functional limits Presentation: Self Fed     [name] I. Vear Clock, MS, CCC-SLP Acute Rehabilitation Services Office number [phone] Pager [phone]  Scheryl Marten 01/23/2020,3:37 PM

## 2020-01-23 NOTE — Progress Notes (Signed)
Patient sat up on the side of the bed very confused. She was sure that her family did not know where she was and that staff were watching her. It was explained that at night nursing staff conduct rounds in which we visualize the patients for safety and may not speak if the patient appears to be resting. She was finally reassured that she was in a safe place and that her children were aware of her whereabouts.

## 2020-01-24 LAB — URINE CULTURE: Culture: >=100000 — AB

## 2020-01-24 LAB — MAGNESIUM: Magnesium: 2 mg/dL (ref 1.7–2.4)

## 2020-01-24 LAB — COMPREHENSIVE METABOLIC PANEL
ALT: 24 U/L (ref 0–44)
AST: 19 U/L (ref 15–41)
Albumin: 2.8 g/dL — ABNORMAL LOW (ref 3.5–5.0)
Alkaline Phosphatase: 54 U/L (ref 38–126)
Anion gap: 11 (ref 5–15)
BUN: 16 mg/dL (ref 8–23)
CO2: 28 mmol/L (ref 22–32)
Calcium: 8.7 mg/dL — ABNORMAL LOW (ref 8.9–10.3)
Chloride: 102 mmol/L (ref 98–111)
Creatinine, Ser: 0.76 mg/dL (ref 0.44–1.00)
GFR, Estimated: >60 mL/min (ref >60)
Glucose, Bld: 107 mg/dL — ABNORMAL HIGH (ref 70–99)
Potassium: 3.8 mmol/L (ref 3.5–5.1)
Sodium: 141 mmol/L (ref 135–145)
Total Bilirubin: 0.5 mg/dL (ref 0.3–1.2)
Total Protein: 5.8 g/dL — ABNORMAL LOW (ref 6.5–8.1)

## 2020-01-24 LAB — CBC WITH DIFFERENTIAL/PLATELET
Abs Immature Granulocytes: 0.16 10*3/uL — ABNORMAL HIGH (ref 0.00–0.07)
Basophils Absolute: 0 10*3/uL (ref 0.0–0.1)
Basophils Relative: 0 %
Eosinophils Absolute: 0.1 10*3/uL (ref 0.0–0.5)
Eosinophils Relative: 1 %
HCT: 38 % (ref 36.0–46.0)
Hemoglobin: 11.5 g/dL — ABNORMAL LOW (ref 12.0–15.0)
Immature Granulocytes: 1 %
Lymphocytes Relative: 19 %
Lymphs Abs: 2.2 10*3/uL (ref 0.7–4.0)
MCH: 28.4 pg (ref 26.0–34.0)
MCHC: 30.3 g/dL (ref 30.0–36.0)
MCV: 93.8 fL (ref 80.0–100.0)
Monocytes Absolute: 0.9 10*3/uL (ref 0.1–1.0)
Monocytes Relative: 8 %
Neutro Abs: 7.7 10*3/uL (ref 1.7–7.7)
Neutrophils Relative %: 71 %
Platelets: 358 10*3/uL (ref 150–400)
RBC: 4.05 MIL/uL (ref 3.87–5.11)
RDW: 14.6 % (ref 11.5–15.5)
WBC: 11.1 10*3/uL — ABNORMAL HIGH (ref 4.0–10.5)
nRBC: 0 % (ref 0.0–0.2)

## 2020-01-24 LAB — PHOSPHORUS: Phosphorus: 4.7 mg/dL — ABNORMAL HIGH (ref 2.5–4.6)

## 2020-01-24 LAB — PROCALCITONIN: Procalcitonin: 0.12 ng/mL

## 2020-01-24 MED ORDER — AMLODIPINE BESYLATE 5 MG PO TABS
2.5000 mg | ORAL_TABLET | Freq: Every day | ORAL | Status: DC
  Administered 2020-01-24 – 2020-01-25 (×2): 2.5 mg via ORAL
  Filled 2020-01-24 (×2): qty 1

## 2020-01-24 MED ORDER — SACCHAROMYCES BOULARDII 250 MG PO CAPS: 250.0000 mg | ORAL_CAPSULE | Freq: Two times a day (BID) | ORAL | 0 refills | Status: AC

## 2020-01-24 MED ORDER — ENSURE ENLIVE PO LIQD: 237.0000 mL | Freq: Three times a day (TID) | ORAL | 0 refills | Status: AC

## 2020-01-24 MED ORDER — PREDNISONE 5 MG PO TABS: 40.0000 mg | ORAL_TABLET | Freq: Every day | ORAL | 0 refills | Status: DC

## 2020-01-24 MED ORDER — ADULT MULTIVITAMIN W/MINERALS CH: 1.0000 | ORAL_TABLET | Freq: Every day | ORAL | 0 refills | Status: AC

## 2020-01-24 MED ORDER — AMLODIPINE BESYLATE 2.5 MG PO TABS: 2.5000 mg | ORAL_TABLET | Freq: Every day | ORAL | 0 refills | Status: DC

## 2020-01-24 MED ORDER — CEFDINIR 300 MG PO CAPS
300.0000 mg | ORAL_CAPSULE | Freq: Two times a day (BID) | ORAL | Status: DC
  Administered 2020-01-24 – 2020-01-25 (×3): 300 mg via ORAL
  Filled 2020-01-24 (×3): qty 1

## 2020-01-24 MED ORDER — CEFDINIR 300 MG PO CAPS: 300.0000 mg | ORAL_CAPSULE | Freq: Two times a day (BID) | ORAL | 0 refills | Status: AC

## 2020-01-24 NOTE — Consult Note (Signed)
   [facility] Inpatient Consult   01/24/2020  [name] 05-27-1931 [id number]   [facility] [[facility]]  Accountable Care Organization [ACO] Patient: Medicare NextGen   Patient screened for high score for unplanned readmission risk and for 3 hospitalizations in the past 6 months to check if potential [facility]  [[facility]] Care Management service needs.  Review of patient's medical record reveals patient is planning to return home with home and family per PT notes.  Primary Care Provider is listed as Rodrigo Ran, MD  this provider is listed to provide the transition of care [TOC] for post hospital follow up.  Plan:  Will follow up with inpatient [facility] team regarding care management needs if transitioning back home.  Continue to follow progress and disposition to assess for post hospital care management needs.    Please place a [facility] Care Management consult as appropriate and for questions contact:   Charlesetta Shanks, RN BSN CCM [facility]  [phone] business mobile phone Toll free office [phone]  Fax number: [phone] [email or web address]

## 2020-01-24 NOTE — Plan of Care (Signed)
°  Problem: Fluid Volume: °Goal: Hemodynamic stability will improve °Outcome: Progressing °  °Problem: Clinical Measurements: °Goal: Diagnostic test results will improve °Outcome: Progressing °Goal: Signs and symptoms of infection will decrease °Outcome: Progressing °  °

## 2020-01-24 NOTE — Evaluation (Signed)
Occupational Therapy Evaluation Patient Details Name: [name] MRN: [id number] DOB: 1931/04/02 Today's Date: 01/24/2020    History of Present Illness 86 yo female with onset of fever and weakness at home was brought to ED, now admitted with noted pleural effusion, low O2 sats, recent CVA and UTI.  Pt has sepsis PNA, hoping to go home with family to manage mobility and her self care.  PMHx:  atherosclerosis, emphysema, a-fib, stroke, UTI's, chest pain, EF 77%, falls, bladder repair, R achilles tendonitis, sciatica   Clinical Impression   Pt presents with decline in function and safety with ADLs and ADL mobility with impaired strength, balance and endurance. Pt would benefit from acute OT services to address impairments to maximize level of function and safety. Pt lived at home Ind PTA and currently requires min A with ADL/selfcare and mobility. Pt would benefit from acute OT services to address impairments to maximize level of function and safety    Follow Up Recommendations  Home health OT    Equipment Recommendations  None recommended by OT    Recommendations for Other Services       Precautions / Restrictions Precautions Precautions: Fall Precaution Comments: monitor O2 sats Restrictions Weight Bearing Restrictions: No Other Position/Activity Restrictions: requires O2 for mobility      Mobility Bed Mobility Overal bed mobility: Needs Assistance Bed Mobility: Supine to Sit     Supine to sit: Min guard          Transfers Overall transfer level: Needs assistance Equipment used: Rolling walker (2 wheeled);1 person hand held assist Transfers: Sit to/from Stand Sit to Stand: Min assist         General transfer comment: min assist with power up    Balance Overall balance assessment: Needs assistance Sitting-balance support: Feet supported Sitting balance-Leahy Scale: Fair     Standing balance support: Bilateral upper extremity supported;During functional  activity Standing balance-Leahy Scale: Poor                             ADL either performed or assessed with clinical judgement   ADL Overall ADL's : Needs assistance/impaired Eating/Feeding: Independent;Sitting   Grooming: Wash/dry hands;Wash/dry face;Min guard;Standing   Upper Body Bathing: Set up;Supervision/ safety;Sitting   Lower Body Bathing: Minimal assistance   Upper Body Dressing : Set up;Supervision/safety;Sitting   Lower Body Dressing: Minimal assistance   Toilet Transfer: Minimal assistance;Min guard;Ambulation;Stand-pivot   Toileting- Architect and Hygiene: Min guard;Sit to/from stand       Functional mobility during ADLs: Minimal assistance;Min guard       Vision Patient Visual Report: No change from baseline       Perception     Praxis      Pertinent Vitals/Pain Pain Assessment: No/denies pain     Hand Dominance Right   Extremity/Trunk Assessment Upper Extremity Assessment Upper Extremity Assessment: Overall WFL for tasks assessed   Lower Extremity Assessment Lower Extremity Assessment: Defer to PT evaluation   Cervical / Trunk Assessment Cervical / Trunk Assessment: Kyphotic   Communication Communication Communication: No difficulties   Cognition Arousal/Alertness: Awake/alert Behavior During Therapy: WFL for tasks assessed/performed Overall Cognitive Status: Within Functional Limits for tasks assessed                                     General Comments       Exercises  Shoulder Instructions      Home Living Family/patient expects to be discharged to:: Private residence Living Arrangements: Spouse/significant other Available Help at Discharge: Available 24 hours/day;Family Type of Home: House Home Access: Stairs to enter [employer] of Steps: 2   Home Layout: Two level;Able to live on main level with bedroom/bathroom     Bathroom Shower/Tub: Tub only;Walk-in shower    Bathroom Toilet: Standard Bathroom Accessibility: Yes How Accessible: Accessible via walker Home Equipment: Cane - single point;Walker - 2 wheels          Prior Functioning/Environment Level of Independence: Independent with assistive device(s);Needs assistance  Gait / Transfers Assistance Needed: uses SPC (Hurrycane) ADL's / Homemaking Assistance Needed: Ind with ADLs/selfcare, home mgt            OT Problem List: Decreased strength;Decreased activity tolerance;Impaired balance (sitting and/or standing)      OT Treatment/Interventions: Self-care/ADL training;Therapeutic exercise;Therapeutic activities;Patient/family education    OT Goals(Current goals can be found in the care plan section) Acute Rehab OT Goals Patient Stated Goal: to walk and get home OT Goal Formulation: With patient Time For Goal Achievement: 02/07/20 Potential to Achieve Goals: Good ADL Goals Pt Will Perform Grooming: with supervision;with set-up;standing;with caregiver independent in assisting Pt Will Perform Upper Body Bathing: with set-up;sitting;standing;with caregiver independent in assisting Pt Will Perform Lower Body Bathing: with min guard assist;with supervision;with set-up;sit to/from stand;with caregiver independent in assisting Pt Will Perform Upper Body Dressing: with set-up;sitting;standing;with caregiver independent in assisting Pt Will Perform Lower Body Dressing: with min guard assist;with supervision;with set-up;with adaptive equipment;sit to/from stand Pt Will Transfer to Toilet: with supervision;with modified independence;ambulating;regular height toilet Pt Will Perform Toileting - Clothing Manipulation and hygiene: with supervision;sit to/from stand  OT Frequency: Min 2X/week   Barriers to D/C:            Co-evaluation              AM-PAC OT "6 Clicks" Daily Activity     Outcome Measure Help from another person eating meals?: None Help from another person taking care of  personal grooming?: A Little Help from another person toileting, which includes using toliet, bedpan, or urinal?: A Little Help from another person bathing (including washing, rinsing, drying)?: A Little Help from another person to put on and taking off regular upper body clothing?: None Help from another person to put on and taking off regular lower body clothing?: A Little 6 Click Score: 20   End of Session Equipment Utilized During Treatment: Gait belt  Activity Tolerance: Patient tolerated treatment well Patient left: in chair;with call bell/phone within reach;with family/visitor present  OT Visit Diagnosis: Unsteadiness on feet (R26.81);Other abnormalities of gait and mobility (R26.89);Muscle weakness (generalized) (M62.81);Hemiplegia and hemiparesis Hemiplegia - Right/Left: Left Hemiplegia - dominant/non-dominant: Non-Dominant                Time: [id number] OT Time Calculation (min): 27 min Charges:  OT General Charges $OT Visit: 1 Visit OT Evaluation $OT Eval Moderate Complexity: 1 Mod OT Treatments $Self Care/Home Management : 8-22 mins    Britt Bottom 01/24/2020, 3:02 PM

## 2020-01-24 NOTE — Progress Notes (Signed)
SATURATION QUALIFICATIONS:   Patient Saturations on Room Air at Rest = 90%  Patient Saturations on [employer] while Ambulating = 85%  Patient Saturations on 3 Liters of oxygen while Ambulating = 94%  Please briefly explain why patient needs home oxygen:  Patient seems very short of breath while ambulating even with 3 liters of oxygen but oxygen saturation shows 94%.  Patient states not having any discomfort at this time.

## 2020-01-24 NOTE — TOC Initial Note (Addendum)
Transition of Care [facility]) - Initial/Assessment Note    Patient Details  Name: [name] MRN: [id number] Date of Birth: July 19, 1931  Transition of Care [facility]) CM/SW Contact:    Beckie Busing, RN Phone Number: [phone]  01/24/2020, 2:48 PM  Clinical Narrative:                 TOC consulted HH & DME needs. CM at bedside to offer patient and daughter choice. HH referral called in to [facility]. Await return call for final acceptance. Home O2 referral called to [facility]. Portable tank to be delivered to the room. Patient states that she has a rolling walker at home and does not need new walker. Return call from [employer] states that patient is currently active with [facility] for PT/OT and speech. Services will resume upon discharge. Pulmonary referral will need to be doe by physician. MD has been made aware and will make the referral. No further needs noted TOC will sign off.   Expected Discharge Plan: Home w Home Health Services Barriers to Discharge: No Barriers Identified   Patient Goals and CMS Choice Patient states their goals for this hospitalization and ongoing recovery are:: Patient states that she is ready to go home CMS Medicare.gov Compare Post Acute Care list provided to:: Patient Choice offered to / list presented to : Patient,Adult Children  Expected Discharge Plan and Services Expected Discharge Plan: Home w Home Health Services In-house Referral: NA Discharge Planning Services: CM Consult Post Acute Care Choice: Home Health Living arrangements for the past 2 months: Single Family Home Expected Discharge Date: 01/24/20               DME Arranged: Oxygen DME Agency: [employer] Date DME Agency Contacted: 01/24/20 Time DME Agency Contacted: [phone] Representative spoke with at DME Agency: Vaughan Basta HH Arranged: PT,OT HH Agency: [facility] Date HH Agency Contacted: 01/24/20 Time HH Agency Contacted: [phone] Representative spoke with at [facility]  Agency: [place]  Prior Living Arrangements/Services Living arrangements for the past 2 months: Single Family Home Lives with:: Spouse Patient language and need for interpreter reviewed:: Yes Do you feel safe going back to the place where you live?: Yes      Need for Family Participation in Patient Care: No (Comment) Care giver support system in place?: Yes (comment) Current home services:  (n/a) Criminal Activity/Legal Involvement Pertinent to Current Situation/Hospitalization: No - Comment as needed  Activities of Daily Living Home Assistive Devices/Equipment: Wheelchair,Cane (specify quad or straight) ADL Screening (condition at time of admission) Patient's cognitive ability adequate to safely complete daily activities?: Yes Is the patient deaf or have difficulty hearing?: No Does the patient have difficulty seeing, even when wearing glasses/contacts?: Yes (had cataract surgery, wear a patch, daught think she needs to see the opthamologist. have problem seeingat times) Does the patient have difficulty concentrating, remembering, or making decisions?: No Patient able to express need for assistance with ADLs?: Yes Does the patient have difficulty dressing or bathing?: Yes (Because of prior stroke) Independently performs ADLs?: No Communication: Needs assistance Is this a change from baseline?: Change from baseline, expected to last >3 days Dressing (OT): Needs assistance Is this a change from baseline?: Change from baseline, expected to last >3 days Grooming: Needs assistance Is this a change from baseline?: Change from baseline, expected to last >3 days (Since prior stroke in November 2021) Feeding: Independent Bathing: Needs assistance Is this a change from baseline?: Change from baseline, expected to last >3 days  Toileting: Needs assistance Is this a change from baseline?: Change from baseline, expected to last >3days In/Out Bed: Needs assistance Is this a change from baseline?:  Change from baseline, expected to last >3 days Does the patient have difficulty walking or climbing stairs?: Yes Weakness of Legs: None Weakness of Arms/Hands: None  Permission Sought/Granted                  Emotional Assessment Appearance:: Appears younger than stated age Attitude/Demeanor/Rapport: Gracious Affect (typically observed): Pleasant Orientation: : Oriented to Self,Oriented to Place,Oriented to  Time,Oriented to Situation Alcohol / Substance Use: Not Applicable Psych Involvement: No (comment)  Admission diagnosis:  CAP (community acquired pneumonia) [J18.9] Community acquired pneumonia, unspecified laterality [J18.9] Sepsis, due to unspecified organism, unspecified whether acute organ dysfunction present [facility]) [A41.9] Patient Active Problem List   Diagnosis Date Noted  . Sepsis due to pneumonia (HCC) 01/20/2020  . Pseudoaneurysm of femoral artery following procedure (HCC) 11/23/2019  . CAP (community acquired pneumonia) 11/23/2019  . Essential hypertension 11/23/2019  . Hyperlipidemia LDL goal <70 11/23/2019  . Emphysema of lung (HCC) 11/23/2019  . Aortic atherosclerosis (HCC) 11/23/2019  . Acute blood loss anemia 11/23/2019  . Acute ischemic stroke (HCC)   . Pseudoaneurysm (HCC)   . Slow transit constipation   . Chronic UTI   . Acute ischemic right PCA stroke (HCC) s/p tPA and mechanical thrombectomy, d/t AF not on [facility] 11/14/2019  . Acute respiratory failure with hypoxia (HCC)   . Stroke (HCC) 11/13/2019  . Hymenoptera allergy 10/05/2014  . SVT (supraventricular tachycardia) (HCC) 10/20/2012  . Fall   . Chest pain   . Ejection fraction   . Atrial fibrillation (HCC)   . Atrial flutter (HCC)    PCP:  Rodrigo Ran, MD Pharmacy:   [facility] [place], [place] - [address] [place] [address] Phone: [phone] Fax: [phone]     Social Determinants of Health (SDOH) Interventions     Readmission Risk Interventions No flowsheet data found.

## 2020-01-24 NOTE — Consult Note (Addendum)
I have seen and examined the patient. I have personally reviewed the clinical findings, laboratory findings, microbiological data and imaging studies. The assessment and treatment plan was discussed with the  Advance Practice Provider, [name].  I agree with Melissa/his recommendations.  Melissa Fraction, MD [facility]      [facility]    Date of Admission:  01/19/2020     Total days of antibiotics 6  Ceftriaxone 12/25 >> current   Azithromycin 12/25 >> current   Amoxicillin 12/29 >> current          Reason for Consult: ?UTI     Referring Provider: Margo Lynch  Primary Care Provider: Rodrigo Ran, MD    Assessment: [name] is a 86 y.o. female with history of CVA (11/2019), afib on chronic eliquis, UTIs and pneumonia with hospitalization due to generalized weakness, fever, cough and hypoxia. She has been treated with IV ceftriaxone and azithromycin with resolution of hypoxia, although still with a nonproductive cough and weakness. She has had resolution of leukocytosis and remained afebrile. Still requiring oxygen with activity (dropped walking yesterday requiring 3L).   Urine culture was taken on admission in the absence of urinary symptoms; [name] verifies she had no urinary complaints or symptoms at all prior to this hospitalization. This returned with growth of Enterococcus faecalis > 100k colonies and ESBL producing klebsiella pneumoniae 10k colonies.   I think it is pretty clear Melissa current illness was due to unresolved pneumonia and not urinary tract infection. [name]'s main concern today is the fact that she is Melissa ongoing fatigued. Discussed with the patient and [name] the harm of treating organisms found in the setting of asymptomatic bacteriuria and multidrug resistant organisms, which it seems she may be colonized with. She has also had quite a few things come up with Melissa health  since November (stroke, pneumonia, UTI); cumulative effect of these illnesses I would suspect may set Melissa back with recovery.   Very mild leukocytosis today more reflecting of steroids that have been added, I would presume.   I suspect she will take some time to recover from this episode of pneumonia with supplemental oxygen at home for a period of time. Agree with plans for pulmonary follow up outpatient. Would complete CAP treatment as planned with D/C home as planned.  Stay hydrated flushing bladder frequently.  PT/OT planned for home consult    Plan: 1. Continue CAP treatment as planned  2. Stop meropenem and amox with asymptomatic bacteriuria  3. Appreciate [name] coordination of Melissa care   No follow up with ID required - anticipate discharge home today or tomorrow if home care can be coordinated timely.     Principal Problem:   Sepsis due to pneumonia [facility]) Active Problems:   Atrial fibrillation (HCC)   Acute respiratory failure with hypoxia (HCC)   CAP (community acquired pneumonia)   . amLODipine  2.5 mg Oral Daily  . amoxicillin  500 mg Oral Q8H  . apixaban  5 mg Oral BID  . atorvastatin  10 mg Oral QHS  . azithromycin  500 mg Oral QHS  . cholecalciferol  2,000 Units Oral QHS  . feeding supplement  237 mL Oral TID BM  . methylPREDNISolone (SOLU-MEDROL) injection  40 mg Intravenous Q24H  . mirtazapine  15 mg Oral QHS  . multivitamin with minerals  1 tablet Oral Daily  . saccharomyces boulardii  250 mg  Oral BID    HPI: [name] is a 86 y.o. female admitted from home on 01/19/2020 for weakness.   PMHx includes CVA, afib (on eliquis), recurrent UTIs, pneumonia.   Seen in ER 11/24 - 11/25 for generalized weakness with nausea and vomiting. Rigors reported by [name]. Was started on an antibiotic prior to this encounter for UTI and thought to be due to dehydration and unresolved UTI at that point.   Seen in UC 12/18 for cough and wheezing - given 7d  doxycycline at this visit for possible infiltrate on CXR with supportive care. This did not improve Melissa symptoms at all and she was brought back to the ER 12/25 for further evaluation.  In the ER she was found to be hypoxic with sats in 80s, this improved on 2LPM O2. Febrile to 101.4, leukocytosis 11.6K and tachycardic 107.  Was started on Ceftriaxone + Azithromycin for CAP coverage.  Blood cultures clear 12/25. Urine cultures, although no urinary symptoms, grew out E faecalis > 100k colony count and ESBL producing Kleb Pneumo 10k colony count.   12/29 reported a persistent non-productive cough.  Placed on IV solumedrol for wheezing - wbc has normalized but now a little elevated today ~11K.   [name] is nervous about resistant bacteria and what we need to do at home for [name] as well as [name]. [name] is hopeful she can be discharged home. She is weak, she agrees, but feels that this is more reflective of cumulative problems she has faced since November.  She described worsening cough and fever despite antibiotics outpatient given on 12/18. She did have dysuria symptoms at the beginning of November that resolved with a course of Macrobid. She reassures me that she had no urinary complaints/symptoms upon admission nor present today.    Review of Systems: Review of Systems  Constitutional: Negative for chills and fever.  Respiratory: Positive for cough and wheezing. Negative for sputum production.   Cardiovascular: Negative for chest pain.  Gastrointestinal: Negative for diarrhea, nausea and vomiting.  Genitourinary: Negative for dysuria.  Skin: Negative for rash.  Neurological: Positive for weakness. Negative for headaches.     Past Medical History:  Diagnosis Date  . Atrial fibrillation ([place])    Prior treatment with amiodarone, held sinus rhythm and drug stopped  . Atrial flutter ([place])    Status post ablation prior to  2009  . Chest pain    Nuclear, 2004, no scar or ischemia,  EF 77%  . Ejection Lynch    EF 70%, nuclear,  2004  //     . Fall    July, 2014  . H/O bladder repair surgery   . Right Achilles tendinitis   . Sciatica     Social History   Tobacco Use  . Smoking status: Former Research scientist (life sciences)  . Smokeless tobacco: Never Used  Vaping Use  . Vaping Use: Never used  Substance Use Topics  . Alcohol use: Yes  . Drug use: Never    Family History  Problem Relation Age of Onset  . Cancer [name]   . Cancer Lynch        pancreatic cancer  . Heart disease Brother    Allergies  Allergen Reactions  . Bee Venom Anaphylaxis  . Mixed Vespid Venom Anaphylaxis  . Codeine Other (See Comments)    Not known  . Sulfa Antibiotics Itching and Nausea Only  . Erythromycin Nausea And Vomiting    OBJECTIVE: Blood pressure (!) 180/55, pulse 61,  temperature 98 F (36.7 C), temperature source Oral, resp. rate 17, weight 61.7 kg, SpO2 96 %.  Physical Exam Constitutional:      Comments: Sitting upright in bed. Smiling and resting comfortably.   HENT:     Mouth/Throat:     Mouth: Mucous membranes are moist.     Pharynx: Oropharynx is clear.  Eyes:     General: No scleral icterus.    Pupils: Pupils are equal, round, and reactive to light.  Cardiovascular:     Rate and Rhythm: Normal rate and regular rhythm.  Pulmonary:     Effort: Pulmonary effort is normal.     Comments: 2LPM via [place] Diminished breath sounds overall. No wheezing heard  Abdominal:     General: Bowel sounds are normal.     Palpations: Abdomen is soft.  Musculoskeletal:        General: Normal range of motion.     Cervical back: Normal range of motion.  Skin:    General: Skin is warm and dry.     Capillary Refill: Capillary refill takes less than 2 seconds.  Neurological:     Mental Status: She is alert and oriented to person, place, and time.     Lab Results Lab Results  Component Value Date   WBC 11.1 (H) 01/24/2020   HGB 11.5 (L) 01/24/2020   HCT 38.0 01/24/2020   MCV 93.8  01/24/2020   PLT 358 01/24/2020    Lab Results  Component Value Date   CREATININE 0.76 01/24/2020   BUN 16 01/24/2020   NA 141 01/24/2020   K 3.8 01/24/2020   CL 102 01/24/2020   CO2 28 01/24/2020    Lab Results  Component Value Date   ALT 24 01/24/2020   AST 19 01/24/2020   ALKPHOS 54 01/24/2020   BILITOT 0.5 01/24/2020     Microbiology: Recent Results (from the past 240 hour(s))  Blood culture (routine single)     Status: None (Preliminary result)   Collection Time: 01/20/20 12:01 AM   Specimen: BLOOD  Result Value Ref Range Status   Specimen Description BLOOD SITE NOT SPECIFIED  Final   Special Requests   Final    BOTTLES DRAWN AEROBIC AND ANAEROBIC Blood Culture results may not be optimal due to an inadequate volume of blood received in culture bottles   Culture   Final    NO GROWTH 4 DAYS Performed at [facility], [place] [address] [place], [place] [address]    Report Status PENDING  Incomplete  Resp Panel by RT-PCR (Flu A&B, Covid) Nasopharyngeal Swab     Status: None   Collection Time: 01/20/20 12:07 AM   Specimen: Nasopharyngeal Swab; Nasopharyngeal(NP) swabs in vial transport medium  Result Value Ref Range Status   SARS Coronavirus 2 by RT PCR NEGATIVE NEGATIVE Final    Comment: (NOTE) SARS-CoV-2 target nucleic acids are NOT DETECTED.  The SARS-CoV-2 RNA is generally detectable in upper respiratory specimens during the acute phase of infection. The lowest concentration of SARS-CoV-2 viral copies this assay can detect is 138 copies/mL. A negative result does not preclude SARS-Cov-2 infection and should not be used as the sole basis for treatment or other patient management decisions. A negative result may occur with  improper specimen collection/handling, submission of specimen other than nasopharyngeal swab, presence of viral mutation(s) within the areas targeted by this assay, and inadequate number of viral copies(<138 copies/mL). A negative result  must be combined with clinical observations, patient history, and  epidemiological information. The expected result is Negative.  Fact Sheet for Patients:  EntrepreneurPulse.com.au  Fact Sheet for Healthcare Providers:  IncredibleEmployment.be  This test is no t yet approved or cleared by the [place] FDA and  has been authorized for detection and/or diagnosis of SARS-CoV-2 by FDA under an Emergency Use Authorization (EUA). This EUA will remain  in effect (meaning this test can be used) for the duration of the COVID-19 declaration under Section 564(b)(1) of the Act, 21 U.S.C.section 360bbb-3(b)(1), unless the authorization is terminated  or revoked sooner.       Influenza A by PCR NEGATIVE NEGATIVE Final   Influenza B by PCR NEGATIVE NEGATIVE Final    Comment: (NOTE) The Xpert Xpress SARS-CoV-2/FLU/RSV plus assay is intended as an aid in the diagnosis of influenza from Nasopharyngeal swab specimens and should not be used as a sole basis for treatment. Nasal washings and aspirates are unacceptable for Xpert Xpress SARS-CoV-2/FLU/RSV testing.  Fact Sheet for Patients: EntrepreneurPulse.com.au  Fact Sheet for Healthcare Providers: IncredibleEmployment.be  This test is not yet approved or cleared by the [place] FDA and has been authorized for detection and/or diagnosis of SARS-CoV-2 by FDA under an Emergency Use Authorization (EUA). This EUA will remain in effect (meaning this test can be used) for the duration of the COVID-19 declaration under Section 564(b)(1) of the Act, 21 U.S.C. section 360bbb-3(b)(1), unless the authorization is terminated or revoked.  Performed at [facility], [place] [address], [place], [place] [address]   Urine culture     Status: Abnormal   Collection Time: 01/21/20  8:02 PM   Specimen: In/Out Cath Urine  Result Value Ref Range Status   Specimen Description  IN/OUT CATH URINE  Final   Special Requests   Final    NONE Performed at [facility], [place] [address] [place], [place] [address]    Culture (A)  Final    >=100,000 COLONIES/mL ENTEROCOCCUS FAECALIS 10,000 COLONIES/mL KLEBSIELLA PNEUMONIAE Confirmed Extended Spectrum Beta-Lactamase Producer (ESBL).  In bloodstream infections from ESBL organisms, carbapenems are preferred over piperacillin/tazobactam. They are shown to have a lower risk of mortality.    Report Status 01/24/2020 FINAL  Final   Organism ID, Bacteria ENTEROCOCCUS FAECALIS (A)  Final   Organism ID, Bacteria KLEBSIELLA PNEUMONIAE (A)  Final      Susceptibility   Enterococcus faecalis - MIC*    AMPICILLIN <=2 SENSITIVE Sensitive     NITROFURANTOIN <=16 SENSITIVE Sensitive     VANCOMYCIN 1 SENSITIVE Sensitive     * >=100,000 COLONIES/mL ENTEROCOCCUS FAECALIS   Klebsiella pneumoniae - MIC*    AMPICILLIN >=32 RESISTANT Resistant     CEFAZOLIN >=64 RESISTANT Resistant     CEFEPIME >=32 RESISTANT Resistant     CEFTRIAXONE >=64 RESISTANT Resistant     CIPROFLOXACIN >=4 RESISTANT Resistant     GENTAMICIN >=16 RESISTANT Resistant     IMIPENEM 0.5 SENSITIVE Sensitive     NITROFURANTOIN 256 RESISTANT Resistant     TRIMETH/SULFA >=320 RESISTANT Resistant     AMPICILLIN/SULBACTAM >=32 RESISTANT Resistant     PIP/TAZO >=128 RESISTANT Resistant     * 10,000 COLONIES/mL KLEBSIELLA PNEUMONIAE    Janene Madeira, MSN, NP-C [facility] [email or web address] Pager: [phone] Office: [place]: [phone]

## 2020-01-24 NOTE — Progress Notes (Signed)
  Speech Language Pathology Treatment: Dysphagia  Patient Details Name: [name] MRN: [id number] DOB: 1931-04-12 Today's Date: 01/24/2020 Time: 1205-1215 SLP Time Calculation (min) (ACUTE ONLY): 10 min  Assessment / Plan / Recommendation Clinical Impression  Pt was seen for dysphagia treatment with her daughter present and was cooperative throughout the session. Pt, nursing, and her daughter reported that the pt has been tolerating the current diet without overt s/sx of aspiration. Pt and nursing expressed that the pt has been tolerating pills without any episodes similar to yesterday. Pt expressed that she still prefers that larger pills be crushed instead of given whole in puree. Pt tolerated regular texture solids, and thin liquids via straw using consecutive swallows without symptoms of oropharyngeal dysphagia. It is recommended that the current diet be continued. Further skilled SLP services are not clinically indicated at this time.   HPI HPI: Pt is an 86 y.o. female with medical history significant for A.Fib, CVA, and recurrent UTIs. She was admitted secondary to generalized weakness and diagnosed with sepsis secondary to pneumonia. CT chest 12/27: Areas of consolidation in the right upper lobe, superimposed upon extensive background emphysema. Favor acute pneumonia. SLP consulted with note, "patient's family have noticed patient is pocketing food in her mouth and having difficulty swallowing".Pt received SLP services in November at [facility] for cognition.      SLP Plan  Discharge SLP treatment due to (comment);All goals met       Recommendations  Diet recommendations: Regular;Thin liquid Liquids provided via: Straw;Cup Medication Administration: Whole meds with puree (crush larger pills) Supervision: Patient able to self feed Compensations: Slow rate;Small sips/bites                Oral Care Recommendations: Oral care BID Follow up Recommendations: None SLP Visit Diagnosis:  Dysphagia, unspecified (R13.10) Plan: Discharge SLP treatment due to (comment);All goals met       Sonja Manseau I. Hardin Negus, [place], [place] Office number [phone] Pager [place] 01/24/2020, 12:54 PM

## 2020-01-24 NOTE — Discharge Summary (Signed)
Discharge Summary  [name] [id number] DOB: [birth year]  PCP: Crist Infante, MD  Admit date: 01/19/2020 Discharge date: 01/24/2020  Time spent: 35 minutes  Recommendations for Outpatient Follow-up:  1. Follow-up with your PCP in 1 to 2 weeks 2. Follow-up with pulmonary within a week, a call was made for you to [facility], they will contact you or you may contact them sooner to make an appointment. 3. Follow-up with neurology in 1 to 2 weeks 4. Follow-up with cardiology in 1 to 2 weeks 5. Continue PT OT with assistance and fall precautions 6. Take your medications as prescribed  Discharge Diagnoses:  Active Hospital Problems   Diagnosis Date Noted  . Sepsis due to pneumonia ([place]) 01/20/2020  . CAP (community acquired pneumonia) 11/23/2019  . Acute respiratory failure with hypoxia ([place])   . Atrial fibrillation [facility])     Resolved Hospital Problems  No resolved problems to display.    Discharge Condition: Stable  Diet recommendation: Resume previous diet.  Vitals:   01/24/20 0500 01/24/20 1337  BP: (!) 180/55 135/82  Pulse: 61 84  Resp: 17 20  Temp: 98 F (36.7 C) 98.3 F (36.8 C)  SpO2:  94%    History of present illness:  86 y.o.WF PMHx COPD (emphysema), A.Fib on Eliquisfollowing a stroke early Nov, recurrent UTIs on macrodantin. Worth noting that UA grew out K.Pnemo that was resistant to nitrofurantoin on Nov 5th.  Patient presents to the ED with c/o illness for the past 1 month. Fever at home. Generalized weakness. Recent diagnosis of PNA on 12/18, put on doxycycline. Took doxycycline without improvement in symptoms.  ED Course:Satting 80s on RA, improved on 2L via [place].  Is COVID vaccinated.  CXR similar to last time = bibasilar infiltrates.  Tm 101.4, HR 107, WBC 11.6k, lactate 2.2.  COVID and flu neg.  01/24/20:  Seen and examined with her daughter at bedside.  No acute events overnight.  UA drawn on 01/21/2020 was negative for pyuria  however urine culture drawn on 01/21/2020 grew greater than 100,000 colonies of Enterococcus faecalis and 10,000 colonies of ESBL Klebsiella pneumoniae.  Patient has a history of recurrent UTIs.  ID was consulted to assist with decision to treat or not to treat ESBL Klebsiella pneumoniae, per ID this is a colonization.Marland Kitchen  Hospital Course:  Principal Problem:   Sepsis due to pneumonia [facility]) Active Problems:   Atrial fibrillation ([place])   Acute respiratory failure with hypoxia ([place])   CAP (community acquired pneumonia)  Severe sepsis, resolved, secondary to community-acquired pneumonia  She had a negative UA on presentation however, Urine culture was positive for >100,000 colonies of Enterococcus faecalis and 10,000 colonies of ESBL Klebsiella pneumoniae, POA Also presented with fever, tachycardia, leukocytosis and evidence of pneumonia on chest x-ray with bilateral pulmonary infiltrates. Received 4 days of Rocephin and azithromycin.  Switch to Cefdinir on 01/24/20 for 6 days to complete 10 day-course.  Added Florastor 250 mg BID x 10 days. Procalcitonin downtrending, from 0.8 to 0.5 to 0.1. Afebrile  Acute hypoxic respiratory failure secondary to above, possible emphysema, and small bilateral pleural effusions. Not on oxygen supplementation at baseline Personally reviewed CT chest done on 01/21/20 showing areas of consolidation in the right upper lobe superimposed on extensive background of suspected emphysema, small bilateral pleural effusions. Currently on 3 L with O2 saturation 94% at rest. DME oxygen ordered. Wean off oxygen supplementation as tolerated Taper off steroids Follow-up with pulmonary within a week.  UTI ruled out by  ID/Enterococcus faecalis/Klebsiella pneumoniae positive urine culture, POA She has a history of recurrent UTI Presented with UA negative for pyuria Urine culture drawn on 01/21/2020 grew >100,000 colonies of Enterococcus faecalis and 10,000 colonies of ESBL  Klebsiella pneumoniae, POA ID was consulted to assist with decision to treat or not to treat ESBL Klebsiella pneumoniae, per ID this is a colonization.  ID DC'd meropenem and ampicillin on 01/24/2020.  Elevated BP, improved on Norvasc. Not on oral antihypertensives prior to admission Started on low-dose Norvasc 2.5 mg daily Last BP improved, 135/82 with heart rate of 84 from 180/55. Follow-up with your PCP in 1 to 2 weeks.  Leukocytosis, likely reactive in the setting of IV steroids use Afebrile Continue Cefdinir outpatient Taper off steroids with prednisone x 10 days Follow up with your PCP in 1-2 weeks and pulmonary within a week  Chronic A. fib Not on any rate control agent Continue Eliquis for CVA prevention.  Generalized weakness PT assessment recommended home health PT OT. Continue fall precautions   Code Status: Full code  Family Communication: Updated her daughter at bedside on 01/23/2020 and 01/24/2020.  Disposition Plan: Likely will DC to home with home health services.   Consultants: None.  Procedures:  None.  Antimicrobials:  Rocephin  Azithromycin  Amoxicillin started on 01/23/2020 and DC'd on 01/24/2020 by ID.  Meropenem started on 01/24/2020, DC'd by ID  DVT prophylaxis: Eliquis.   Procedures:  None.  Consultations:  Infectious disease  Discharge Exam: BP 135/82 (BP Location: Left Arm)   Pulse 84   Temp 98.3 F (36.8 C) (Oral)   Resp 20   Wt 61.7 kg   LMP  (LMP Unknown)   SpO2 94%   BMI 26.56 kg/m  . General: 86 y.o. year-old female well developed well nourished in no acute distress.  Alert, interactive, pleasant. . Cardiovascular: Regular rate and rhythm with no rubs or gallops.  No thyromegaly or JVD noted.   Marland Kitchen Respiratory: Mild rales at bases.  No wheezing noted.  Good inspiratory effort.   . Abdomen: Soft nontender nondistended with normal bowel sounds x4 quadrants. . Musculoskeletal: No lower extremity edema.  2/4 pulses in all 4 extremities. Marland Kitchen Psychiatry: Mood is appropriate for condition and setting  Discharge Instructions You were cared for by a hospitalist during your hospital stay. If you have any questions about your discharge medications or the care you received while you were in the hospital after you are discharged, you can call the unit and asked to speak with the hospitalist on call if the hospitalist that took care of you is not available. Once you are discharged, your primary care physician will handle any further medical issues. Please note that NO REFILLS for any discharge medications will be authorized once you are discharged, as it is imperative that you return to your primary care physician (or establish a relationship with a primary care physician if you do not have one) for your aftercare needs so that they can reassess your need for medications and monitor your lab values.   Allergies as of 01/24/2020      Reactions   Bee Venom Anaphylaxis   Mixed Vespid Venom Anaphylaxis   Codeine Other (See Comments)   Not known   Sulfa Antibiotics Itching, Nausea Only   Erythromycin Nausea And Vomiting      Medication List    STOP taking these medications   doxycycline 100 MG tablet Commonly known as: VIBRA-TABS     TAKE these medications  amLODipine 2.5 MG tablet Commonly known as: NORVASC Take 1 tablet (2.5 mg total) by mouth daily. Start taking on: January 25, 2020   atorvastatin 10 MG tablet Commonly known as: LIPITOR Take 10 mg by mouth at bedtime.   cefdinir 300 MG capsule Commonly known as: OMNICEF Take 1 capsule (300 mg total) by mouth every 12 (twelve) hours for 6 days.   Eliquis 5 MG Tabs tablet Generic drug: apixaban TAKE 1 TABLET BY MOUTH TWICE DAILY. What changed: how much to take   feeding supplement Liqd Take 237 mLs by mouth 3 (three) times daily between meals for 7 days.   mirtazapine 15 MG tablet Commonly known as: REMERON Take 15 mg by mouth at  bedtime.   multivitamin with minerals Tabs tablet Take 1 tablet by mouth daily. Start taking on: January 25, 2020   nitrofurantoin 100 MG capsule Commonly known as: MACRODANTIN Take 100 mg by mouth at bedtime.   predniSONE 5 MG tablet Commonly known as: DELTASONE Take 8 tablets (40 mg total) by mouth daily with breakfast. Take 40 mg daily x2 days, then, Take 30 mg daily x2 days, then, Take 20 mg daily x2 days, then, Take 10 mg daily x2 days, then, Take 5 mg daily x2 days, then, stop.   saccharomyces boulardii 250 MG capsule Commonly known as: FLORASTOR Take 1 capsule (250 mg total) by mouth 2 (two) times daily for 10 days.   VITAMIN B12 PO Take 1 tablet by mouth at bedtime.   vitamin D3 50 MCG (2000 UT) Caps Take 2,000 Units by mouth at bedtime.            Durable Medical Equipment  (From admission, onward)         Start     Ordered   01/24/20 1145  For home use only DME oxygen  Once       Question Answer Comment  Length of Need 6 Months   Mode or (Route) Nasal cannula   Liters per Minute 3   Frequency Continuous (stationary and portable oxygen unit needed)   Oxygen conserving device Yes   Oxygen delivery system Gas      01/24/20 1145   01/24/20 0700  For home use only DME Walker rolling  Once       Question Answer Comment  Walker: With 5 Inch Wheels   Patient needs a walker to treat with the following condition Ambulatory dysfunction      01/24/20 0624         Allergies  Allergen Reactions  . Bee Venom Anaphylaxis  . Mixed Vespid Venom Anaphylaxis  . Codeine Other (See Comments)    Not known  . Sulfa Antibiotics Itching and Nausea Only  . Erythromycin Nausea And Vomiting    Follow-up Information    Crist Infante, MD. Call in 1 day(s).   Specialty: Internal Medicine Why: Please call for a post hospital follow-up appointment. Contact information: [place] [address] [phone]        Jerline Pain, MD .   Specialty:  Cardiology Contact information: [phone] [address] [place] [address] [phone]        Tanda Rockers, MD. Call in 1 day(s).   Specialty: Pulmonary Disease Why: Please call for a post hospital follow-up appointment within a week. Contact information: [place] [address] [place] [address] [phone]                The results  of significant diagnostics from this hospitalization (including imaging, microbiology, ancillary and laboratory) are listed below for reference.    Significant Diagnostic Studies: DG Chest 2 View  Result Date: 01/12/2020 CLINICAL DATA:  Cough and shortness of breath EXAM: CHEST - 2 VIEW COMPARISON:  December 19, 2019 FINDINGS: The cardiomediastinal silhouette is unchanged in contour.Atherosclerotic calcifications of the aorta. Small LEFT and trace RIGHT pleural effusion. No pneumothorax. Increased diffuse interstitial prominence. Increased RIGHT apical reticular and minimally consolidative opacities. Bibasilar heterogeneous opacities, mild. Visualized abdomen is unremarkable. Multilevel degenerative changes of the thoracic spine. IMPRESSION: 1. Constellation of findings are favored to reflect pulmonary edema with scattered areas of atelectasis. Superimposed infection remains in the differential. 2. Small bilateral pleural effusions. Electronically Signed   By: Meda Klinefelter MD   On: 01/12/2020 15:22   CT CHEST WO CONTRAST  Result Date: 01/21/2020 CLINICAL DATA:  COPD exacerbation, abnormal chest x-ray EXAM: CT CHEST WITHOUT CONTRAST TECHNIQUE: Multidetector CT imaging of the chest was performed following the standard protocol without IV contrast. COMPARISON:  01/20/2020, 11/14/2019, 12-13-2019 FINDINGS: Cardiovascular: The heart is unremarkable without pericardial effusion. Normal caliber of the thoracic aorta. Mild atherosclerosis of the aorta and coronary vasculature. Mediastinum/Nodes: Borderline enlarged mediastinal lymph  nodes are seen, largest measuring 12 mm in short axis right paratracheal region image 38/3. Thyroid, trachea, and esophagus are unremarkable. Lungs/Pleura: Upper lobe predominant emphysema is noted, with bullous changes at the apices, right greater than left. Stable areas of consolidation within the periphery of the left apex are compatible with scarring. There is an area of right upper lobe consolidation new since prior CT 2019/12/13, most pronounced on images 27-33 of series 3, which could reflect superimposed infection. There are trace bilateral pleural effusions. No pneumothorax. Central airways are patent. Upper Abdomen: No acute abnormality. Musculoskeletal: There are no acute or destructive bony lesions. Reconstructed images demonstrate no additional findings. IMPRESSION: 1. Areas of consolidation in the right upper lobe, superimposed upon extensive background emphysema. Favor acute pneumonia. Follow-up imaging is recommended after appropriate medical management to document resolution. 2. Borderline enlarged mediastinal lymph nodes, likely reactive. 3. Small bilateral pleural effusions. 4. Aortic Atherosclerosis (ICD10-I70.0) and Emphysema (ICD10-J43.9). Electronically Signed   By: Sharlet Salina M.D.   On: 01/21/2020 19:06   DG Chest Port 1 View  Result Date: 01/20/2020 CLINICAL DATA:  Possible sepsis.  Weakness. EXAM: PORTABLE CHEST 1 VIEW COMPARISON:  01/12/2020 FINDINGS: The cardiac silhouette is mildly enlarged. Aortic atherosclerosis is noted. Mild diffuse interstitial densities in both lungs as well as reticulonodular densities in the right greater than left lung apices have not significantly changed. There is likely a persistent trace right pleural effusion. No sizable left pleural effusion is evident. No pneumothorax is identified. No acute osseous abnormality is seen. IMPRESSION: Unchanged bilateral lung opacities which could reflect infection or edema. Persistent trace right pleural effusion.  Electronically Signed   By: Sebastian Ache M.D.   On: 01/20/2020 01:21    Microbiology: Recent Results (from the past 240 hour(s))  Blood culture (routine single)     Status: None (Preliminary result)   Collection Time: 01/20/20 12:01 AM   Specimen: BLOOD  Result Value Ref Range Status   Specimen Description BLOOD SITE NOT SPECIFIED  Final   Special Requests   Final    BOTTLES DRAWN AEROBIC AND ANAEROBIC Blood Culture results may not be optimal due to an inadequate volume of blood received in culture bottles   Culture   Final    NO GROWTH 4  DAYS Performed at [facility], [place] [address] [place], [place] [address]    Report Status PENDING  Incomplete  Resp Panel by RT-PCR (Flu A&B, Covid) Nasopharyngeal Swab     Status: None   Collection Time: 01/20/20 12:07 AM   Specimen: Nasopharyngeal Swab; Nasopharyngeal(NP) swabs in vial transport medium  Result Value Ref Range Status   SARS Coronavirus 2 by RT PCR NEGATIVE NEGATIVE Final    Comment: (NOTE) SARS-CoV-2 target nucleic acids are NOT DETECTED.  The SARS-CoV-2 RNA is generally detectable in upper respiratory specimens during the acute phase of infection. The lowest concentration of SARS-CoV-2 viral copies this assay can detect is 138 copies/mL. A negative result does not preclude SARS-Cov-2 infection and should not be used as the sole basis for treatment or other patient management decisions. A negative result may occur with  improper specimen collection/handling, submission of specimen other than nasopharyngeal swab, presence of viral mutation(s) within the areas targeted by this assay, and inadequate number of viral copies(<138 copies/mL). A negative result must be combined with clinical observations, patient history, and epidemiological information. The expected result is Negative.  Fact Sheet for Patients:  EntrepreneurPulse.com.au  Fact Sheet for Healthcare Providers:   IncredibleEmployment.be  This test is no t yet approved or cleared by the [place] FDA and  has been authorized for detection and/or diagnosis of SARS-CoV-2 by FDA under an Emergency Use Authorization (EUA). This EUA will remain  in effect (meaning this test can be used) for the duration of the COVID-19 declaration under Section 564(b)(1) of the Act, 21 U.S.C.section 360bbb-3(b)(1), unless the authorization is terminated  or revoked sooner.       Influenza A by PCR NEGATIVE NEGATIVE Final   Influenza B by PCR NEGATIVE NEGATIVE Final    Comment: (NOTE) The Xpert Xpress SARS-CoV-2/FLU/RSV plus assay is intended as an aid in the diagnosis of influenza from Nasopharyngeal swab specimens and should not be used as a sole basis for treatment. Nasal washings and aspirates are unacceptable for Xpert Xpress SARS-CoV-2/FLU/RSV testing.  Fact Sheet for Patients: EntrepreneurPulse.com.au  Fact Sheet for Healthcare Providers: IncredibleEmployment.be  This test is not yet approved or cleared by the [place] FDA and has been authorized for detection and/or diagnosis of SARS-CoV-2 by FDA under an Emergency Use Authorization (EUA). This EUA will remain in effect (meaning this test can be used) for the duration of the COVID-19 declaration under Section 564(b)(1) of the Act, 21 U.S.C. section 360bbb-3(b)(1), unless the authorization is terminated or revoked.  Performed at [facility], [place] [address], [place], [place] [address]   Urine culture     Status: Abnormal   Collection Time: 01/21/20  8:02 PM   Specimen: In/Out Cath Urine  Result Value Ref Range Status   Specimen Description IN/OUT CATH URINE  Final   Special Requests   Final    NONE Performed at [facility], [place] [address] [place], [place] [address]    Culture (A)  Final    >=100,000 COLONIES/mL ENTEROCOCCUS FAECALIS 10,000 COLONIES/mL KLEBSIELLA  PNEUMONIAE Confirmed Extended Spectrum Beta-Lactamase Producer (ESBL).  In bloodstream infections from ESBL organisms, carbapenems are preferred over piperacillin/tazobactam. They are shown to have a lower risk of mortality.    Report Status 01/24/2020 FINAL  Final   Organism ID, Bacteria ENTEROCOCCUS FAECALIS (A)  Final   Organism ID, Bacteria KLEBSIELLA PNEUMONIAE (A)  Final      Susceptibility   Enterococcus faecalis - MIC*  AMPICILLIN <=2 SENSITIVE Sensitive     NITROFURANTOIN <=16 SENSITIVE Sensitive     VANCOMYCIN 1 SENSITIVE Sensitive     * >=100,000 COLONIES/mL ENTEROCOCCUS FAECALIS   Klebsiella pneumoniae - MIC*    AMPICILLIN >=32 RESISTANT Resistant     CEFAZOLIN >=64 RESISTANT Resistant     CEFEPIME >=32 RESISTANT Resistant     CEFTRIAXONE >=64 RESISTANT Resistant     CIPROFLOXACIN >=4 RESISTANT Resistant     GENTAMICIN >=16 RESISTANT Resistant     IMIPENEM 0.5 SENSITIVE Sensitive     NITROFURANTOIN 256 RESISTANT Resistant     TRIMETH/SULFA >=320 RESISTANT Resistant     AMPICILLIN/SULBACTAM >=32 RESISTANT Resistant     PIP/TAZO >=128 RESISTANT Resistant     * 10,000 COLONIES/mL KLEBSIELLA PNEUMONIAE     Labs: Basic Metabolic Panel: Recent Labs  Lab 01/20/20 0333 01/21/20 0935 01/22/20 0335 01/23/20 0319 01/24/20 0236  NA 139 142 142 141 141  K 4.1 3.8 4.9 4.5 3.8  CL 107 105 108 106 102  CO2 23 27 27 27 28   GLUCOSE 119* 145* 136* 144* 107*  BUN 11 12 16 16 16   CREATININE 0.77 0.85 0.66 0.70 0.76  CALCIUM 8.1* 9.0 8.8* 8.8* 8.7*  MG  --  2.0 2.2 2.1 2.0  PHOS  --  3.3 4.1 3.7 4.7*   Liver Function Tests: Recent Labs  Lab 01/20/20 0333 01/21/20 0935 01/22/20 0335 01/23/20 0319 01/24/20 0236  AST 28 27 22 20 19   ALT 22 23 23 21 24   ALKPHOS 58 58 55 50 54  BILITOT 0.9 0.6 0.2* 0.2* 0.5  PROT 5.3* 6.1* 5.7* 5.6* 5.8*  ALBUMIN 2.5* 2.7* 2.6* 2.5* 2.8*   No results for input(s): LIPASE, AMYLASE in the last 168 hours. No results for input(s):  AMMONIA in the last 168 hours. CBC: Recent Labs  Lab 01/19/20 2353 01/20/20 0333 01/21/20 0935 01/22/20 0335 01/23/20 0319 01/24/20 0236  WBC 11.6* 13.1* 6.9 7.2 8.7 11.1*  NEUTROABS 10.0*  --  5.4 5.6 6.9 7.7  HGB 11.5* 10.7* 10.9* 10.4* 10.2* 11.5*  HCT 37.7 34.7* 34.1* 33.3* 32.5* 38.0  MCV 93.8 92.8 92.2 93.3 93.7 93.8  PLT 305 306 286 297 360 358   Cardiac Enzymes: No results for input(s): CKTOTAL, CKMB, CKMBINDEX, TROPONINI in the last 168 hours. BNP: BNP (last 3 results) Recent Labs    01/20/20 0234  BNP 257.0*    ProBNP (last 3 results) No results for input(s): PROBNP in the last 8760 hours.  CBG: No results for input(s): GLUCAP in the last 168 hours.     Signed:  Kayleen Memos, MD [facility] 01/24/2020, 2:18 PM

## 2020-01-25 DIAGNOSIS — A419 Sepsis, unspecified organism: Principal | ICD-10-CM

## 2020-01-25 DIAGNOSIS — J189 Pneumonia, unspecified organism: Secondary | ICD-10-CM | POA: Diagnosis not present

## 2020-01-25 DIAGNOSIS — I480 Paroxysmal atrial fibrillation: Secondary | ICD-10-CM

## 2020-01-25 LAB — MAGNESIUM: Magnesium: 2.2 mg/dL (ref 1.7–2.4)

## 2020-01-25 LAB — CBC WITH DIFFERENTIAL/PLATELET
Abs Immature Granulocytes: 0.11 10*3/uL — ABNORMAL HIGH (ref 0.00–0.07)
Basophils Absolute: 0.1 10*3/uL (ref 0.0–0.1)
Basophils Relative: 1 %
Eosinophils Absolute: 0.3 10*3/uL (ref 0.0–0.5)
Eosinophils Relative: 4 %
HCT: 37.8 % (ref 36.0–46.0)
Hemoglobin: 11.9 g/dL — ABNORMAL LOW (ref 12.0–15.0)
Immature Granulocytes: 2 %
Lymphocytes Relative: 25 %
Lymphs Abs: 1.8 10*3/uL (ref 0.7–4.0)
MCH: 28.9 pg (ref 26.0–34.0)
MCHC: 31.5 g/dL (ref 30.0–36.0)
MCV: 91.7 fL (ref 80.0–100.0)
Monocytes Absolute: 0.6 10*3/uL (ref 0.1–1.0)
Monocytes Relative: 8 %
Neutro Abs: 4.5 10*3/uL (ref 1.7–7.7)
Neutrophils Relative %: 60 %
Platelets: 336 10*3/uL (ref 150–400)
RBC: 4.12 MIL/uL (ref 3.87–5.11)
RDW: 14.7 % (ref 11.5–15.5)
WBC: 7.4 10*3/uL (ref 4.0–10.5)
nRBC: 0 % (ref 0.0–0.2)

## 2020-01-25 LAB — COMPREHENSIVE METABOLIC PANEL
ALT: 20 U/L (ref 0–44)
AST: 19 U/L (ref 15–41)
Albumin: 2.7 g/dL — ABNORMAL LOW (ref 3.5–5.0)
Alkaline Phosphatase: 59 U/L (ref 38–126)
Anion gap: 10 (ref 5–15)
BUN: 10 mg/dL (ref 8–23)
CO2: 32 mmol/L (ref 22–32)
Calcium: 8.6 mg/dL — ABNORMAL LOW (ref 8.9–10.3)
Chloride: 99 mmol/L (ref 98–111)
Creatinine, Ser: 0.71 mg/dL (ref 0.44–1.00)
GFR, Estimated: >60 mL/min (ref >60)
Glucose, Bld: 100 mg/dL — ABNORMAL HIGH (ref 70–99)
Potassium: 4.3 mmol/L (ref 3.5–5.1)
Sodium: 141 mmol/L (ref 135–145)
Total Bilirubin: 0.6 mg/dL (ref 0.3–1.2)
Total Protein: 5.7 g/dL — ABNORMAL LOW (ref 6.5–8.1)

## 2020-01-25 LAB — CULTURE, BLOOD (SINGLE): Culture: NO GROWTH

## 2020-01-25 LAB — PHOSPHORUS: Phosphorus: 5.1 mg/dL — ABNORMAL HIGH (ref 2.5–4.6)

## 2020-01-25 NOTE — Progress Notes (Signed)
INFECTIOUS DISEASE PROGRESS NOTE  ID: [name] is a 86 y.o. female with  Principal Problem:   Sepsis due to pneumonia [facility]) Active Problems:   Atrial fibrillation (HCC)   Acute respiratory failure with hypoxia (HCC)   CAP (community acquired pneumonia)  Subjective: "I'm going home"  Abtx:  Anti-infectives (From admission, onward)   Start     Dose/Rate Route Frequency Ordered Stop   01/24/20 1500  cefdinir (OMNICEF) capsule 300 mg        300 mg Oral Every 12 hours 01/24/20 1407     01/24/20 0000  cefdinir (OMNICEF) 300 MG capsule        300 mg Oral Every 12 hours 01/24/20 1411 01/30/20 2359   01/23/20 1730  amoxicillin (AMOXIL) capsule 500 mg  Status:  Discontinued        500 mg Oral Every 8 hours 01/23/20 1640 01/24/20 1322   01/22/20 2200  azithromycin (ZITHROMAX) tablet 500 mg        500 mg Oral Daily at bedtime 01/22/20 1006 01/24/20 2116   01/20/20 2200  cefTRIAXone (ROCEPHIN) 2 g in sodium chloride 0.9 % 100 mL IVPB  Status:  Discontinued        2 g 200 mL/hr over 30 Minutes Intravenous Every 24 hours 01/20/20 0245 01/24/20 1407   01/20/20 2200  azithromycin (ZITHROMAX) 500 mg in sodium chloride 0.9 % 250 mL IVPB  Status:  Discontinued        500 mg 250 mL/hr over 60 Minutes Intravenous Every 24 hours 01/20/20 0245 01/22/20 1006   01/20/20 0030  cefTRIAXone (ROCEPHIN) 1 g in sodium chloride 0.9 % 100 mL IVPB        1 g 200 mL/hr over 30 Minutes Intravenous  Once 01/20/20 0026 01/20/20 0130   01/20/20 0030  azithromycin (ZITHROMAX) 500 mg in sodium chloride 0.9 % 250 mL IVPB        500 mg 250 mL/hr over 60 Minutes Intravenous  Once 01/20/20 0026 01/20/20 0203      Medications:  Scheduled: . amLODipine  2.5 mg Oral Daily  . apixaban  5 mg Oral BID  . atorvastatin  10 mg Oral QHS  . cefdinir  300 mg Oral Q12H  . cholecalciferol  2,000 Units Oral QHS  . feeding supplement  237 mL Oral TID BM  . methylPREDNISolone (SOLU-MEDROL) injection  40 mg Intravenous Q24H   . mirtazapine  15 mg Oral QHS  . multivitamin with minerals  1 tablet Oral Daily  . saccharomyces boulardii  250 mg Oral BID    Objective: Vital signs in last 24 hours: Temp:  [98.3 F (36.8 C)-98.5 F (36.9 C)] 98.5 F (36.9 C) (12/31 0455) Pulse Rate:  [72-84] 72 (12/31 0455) Resp:  [18-20] 20 (12/31 0455) BP: (135-195)/(81-93) 195/93 (12/31 0455) SpO2:  [94 %-98 %] 97 % (12/31 0455)   General appearance: alert, cooperative and no distress Resp: wheezes anterior - left Cardio: regular rate and rhythm GI: normal findings: bowel sounds normal and soft, non-tender Extremities: edema none  Lab Results Recent Labs    01/24/20 0236 01/25/20 0206  WBC 11.1* 7.4  HGB 11.5* 11.9*  HCT 38.0 37.8  NA 141 141  K 3.8 4.3  CL 102 99  CO2 28 32  BUN 16 10  CREATININE 0.76 0.71   Liver Panel Recent Labs    01/24/20 0236 01/25/20 0206  PROT 5.8* 5.7*  ALBUMIN 2.8* 2.7*  AST 19 19  ALT 24 20  ALKPHOS 54 59  BILITOT 0.5 0.6   Sedimentation Rate No results for input(s): ESRSEDRATE in the last 72 hours. C-Reactive Protein No results for input(s): CRP in the last 72 hours.  Microbiology: Recent Results (from the past 240 hour(s))  Blood culture (routine single)     Status: None   Collection Time: 01/20/20 12:01 AM   Specimen: BLOOD  Result Value Ref Range Status   Specimen Description BLOOD SITE NOT SPECIFIED  Final   Special Requests   Final    BOTTLES DRAWN AEROBIC AND ANAEROBIC Blood Culture results may not be optimal due to an inadequate volume of blood received in culture bottles   Culture   Final    NO GROWTH 5 DAYS Performed at [facility], [place] [address], [place], [place] [address]    Report Status 01/25/2020 FINAL  Final  Resp Panel by RT-PCR (Flu A&B, Covid) Nasopharyngeal Swab     Status: None   Collection Time: 01/20/20 12:07 AM   Specimen: Nasopharyngeal Swab; Nasopharyngeal(NP) swabs in vial transport medium  Result Value Ref Range Status    SARS Coronavirus 2 by RT PCR NEGATIVE NEGATIVE Final    Comment: (NOTE) SARS-CoV-2 target nucleic acids are NOT DETECTED.  The SARS-CoV-2 RNA is generally detectable in upper respiratory specimens during the acute phase of infection. The lowest concentration of SARS-CoV-2 viral copies this assay can detect is 138 copies/mL. A negative result does not preclude SARS-Cov-2 infection and should not be used as the sole basis for treatment or other patient management decisions. A negative result may occur with  improper specimen collection/handling, submission of specimen other than nasopharyngeal swab, presence of viral mutation(s) within the areas targeted by this assay, and inadequate number of viral copies(<138 copies/mL). A negative result must be combined with clinical observations, patient history, and epidemiological information. The expected result is Negative.  Fact Sheet for Patients:  EntrepreneurPulse.com.au  Fact Sheet for Healthcare Providers:  IncredibleEmployment.be  This test is no t yet approved or cleared by the [place] FDA and  has been authorized for detection and/or diagnosis of SARS-CoV-2 by FDA under an Emergency Use Authorization (EUA). This EUA will remain  in effect (meaning this test can be used) for the duration of the COVID-19 declaration under Section 564(b)(1) of the Act, 21 U.S.C.section 360bbb-3(b)(1), unless the authorization is terminated  or revoked sooner.       Influenza A by PCR NEGATIVE NEGATIVE Final   Influenza B by PCR NEGATIVE NEGATIVE Final    Comment: (NOTE) The Xpert Xpress SARS-CoV-2/FLU/RSV plus assay is intended as an aid in the diagnosis of influenza from Nasopharyngeal swab specimens and should not be used as a sole basis for treatment. Nasal washings and aspirates are unacceptable for Xpert Xpress SARS-CoV-2/FLU/RSV testing.  Fact Sheet for  Patients: EntrepreneurPulse.com.au  Fact Sheet for Healthcare Providers: IncredibleEmployment.be  This test is not yet approved or cleared by the [place] FDA and has been authorized for detection and/or diagnosis of SARS-CoV-2 by FDA under an Emergency Use Authorization (EUA). This EUA will remain in effect (meaning this test can be used) for the duration of the COVID-19 declaration under Section 564(b)(1) of the Act, 21 U.S.C. section 360bbb-3(b)(1), unless the authorization is terminated or revoked.  Performed at [facility], [place] [address] [place], [place] [address]   Urine culture     Status: Abnormal   Collection Time: 01/21/20  8:02 PM   Specimen: In/Out Cath Urine  Result Value  Ref Range Status   Specimen Description IN/OUT CATH URINE  Final   Special Requests   Final    NONE Performed at [facility], [address] [place], [place] [address]    Culture (A)  Final    >=100,000 COLONIES/mL ENTEROCOCCUS FAECALIS 10,000 COLONIES/mL KLEBSIELLA PNEUMONIAE Confirmed Extended Spectrum Beta-Lactamase Producer (ESBL).  In bloodstream infections from ESBL organisms, carbapenems are preferred over piperacillin/tazobactam. They are shown to have a lower risk of mortality.    Report Status 01/24/2020 FINAL  Final   Organism ID, Bacteria ENTEROCOCCUS FAECALIS (A)  Final   Organism ID, Bacteria KLEBSIELLA PNEUMONIAE (A)  Final      Susceptibility   Enterococcus faecalis - MIC*    AMPICILLIN <=2 SENSITIVE Sensitive     NITROFURANTOIN <=16 SENSITIVE Sensitive     VANCOMYCIN 1 SENSITIVE Sensitive     * >=100,000 COLONIES/mL ENTEROCOCCUS FAECALIS   Klebsiella pneumoniae - MIC*    AMPICILLIN >=32 RESISTANT Resistant     CEFAZOLIN >=64 RESISTANT Resistant     CEFEPIME >=32 RESISTANT Resistant     CEFTRIAXONE >=64 RESISTANT Resistant     CIPROFLOXACIN >=4 RESISTANT Resistant     GENTAMICIN >=16 RESISTANT Resistant     IMIPENEM  0.5 SENSITIVE Sensitive     NITROFURANTOIN 256 RESISTANT Resistant     TRIMETH/SULFA >=320 RESISTANT Resistant     AMPICILLIN/SULBACTAM >=32 RESISTANT Resistant     PIP/TAZO >=128 RESISTANT Resistant     * 10,000 COLONIES/mL KLEBSIELLA PNEUMONIAE    Studies/Results: No results found.   Assessment/Plan: COPD Pneumonia UCx+, asx bacteruria CVA Afib  Total days of antibiotics: 7 cefdinir  Plan for d/c home today Daughter has q's about how her O2 will be managed at home (she is currently on 2.5 L (prev on none at home)). I'll defer this to her hospitalist and PCP  She o/w states her breathing is at baseline, she is afeb and WBC is normal.  She denies dysuria.  Available as needed.          Johny Sax MD, FACP Infectious Diseases (pager) [phone] [email or web address] 01/25/2020, 9:28 AM  LOS: 5 days

## 2020-01-25 NOTE — Discharge Summary (Signed)
Discharge Summary  [name]:[id number] DOB: 1931/12/18  PCP: Rodrigo Ran, MD  Admit date: 01/19/2020 Discharge date: 01/25/2020  Time spent: 35 minutes  Recommendations for Outpatient Follow-up:  1. Follow-up with your PCP within a week 2. Follow-up with pulmonary within a week, a call was made for you to [facility], they will contact you or you may contact them sooner to make an appointment. 3. Follow-up with neurology in 1 to 2 weeks 4. Follow-up with cardiology in 1 to 2 weeks 5. Continue PT OT with assistance and fall precautions 6. Take your medications as prescribed  Discharge Diagnoses:  Active Hospital Problems   Diagnosis Date Noted  . Sepsis due to pneumonia (HCC) 01/20/2020  . CAP (community acquired pneumonia) 11/23/2019  . Acute respiratory failure with hypoxia (HCC)   . Atrial fibrillation [facility]     Resolved Hospital Problems  No resolved problems to display.    Discharge Condition: Stable  Diet recommendation: Resume previous diet.  Vitals:   01/24/20 2052 01/25/20 0455  BP: (!) 162/81 (!) 195/93  Pulse: 82 72  Resp: 18 20  Temp: 98.4 F (36.9 C) 98.5 F (36.9 C)  SpO2: 98% 97%    History of present illness:  86 y.o.WF PMHx COPD (emphysema), A.Fib on Eliquisfollowing a stroke early Nov, recurrent UTIs on macrodantin. Worth noting that UA grew out K.Pnemo that was resistant to nitrofurantoin on Nov 5th.  Patient presents to the ED with c/o illness for the past 1 month. Fever at home. Generalized weakness. Recent diagnosis of PNA on 12/18, put on doxycycline. Took doxycycline without improvement in symptoms.  ED Course:Satting 80s on RA, improved on 2L via [place].  Is COVID vaccinated.  CXR similar to last time = bibasilar infiltrates.  Tm 101.4, HR 107, WBC 11.6k, lactate 2.2.  COVID and flu neg.  01/24/20:  Seen and examined with her daughter at bedside.  No acute events overnight.  UA drawn on 01/21/2020 was negative for  pyuria however urine culture drawn on 01/21/2020 grew greater than 100,000 colonies of Enterococcus faecalis and 10,000 colonies of ESBL Klebsiella pneumoniae.  Patient has a history of recurrent UTIs.  ID was consulted to assist with decision to treat or not to treat ESBL Klebsiella pneumoniae, per ID this is a colonization.  01/25/20: DC was delayed yesterday as DME oxygen was not delivered in time.  Spoke with patient's daughter at bedside.  She is concern about the patient taking oral antihypertensive Norvasc.  Explained to her that it is necessary to keep normotensive BP.  She stated that they will stop giving her BP med is they feel like she does not need it.  Advised to take medications as prescribed and to follow up with PCP soon post hospitalization.  She was receptive.  Hospital Course:  Principal Problem:   Sepsis due to pneumonia [facility] Active Problems:   Atrial fibrillation (HCC)   Acute respiratory failure with hypoxia (HCC)   CAP (community acquired pneumonia)  Severe sepsis, resolved, secondary to community-acquired pneumonia  She had a negative UA on presentation however, Urine culture was positive for >100,000 colonies of Enterococcus faecalis and 10,000 colonies of ESBL Klebsiella pneumoniae, POA Also presented with fever, tachycardia, leukocytosis and evidence of pneumonia on chest x-ray with bilateral pulmonary infiltrates. Received 4 days of Rocephin and azithromycin.  Switch to Cefdinir on 01/24/20 for 6 days to complete 10 day-course.  Added Florastor 250 mg BID x 10 days. Procalcitonin downtrending, from 0.8 to 0.5 to  0.1. Afebrile  Acute hypoxic respiratory failure secondary to above, possible emphysema, and small bilateral pleural effusions. Not on oxygen supplementation at baseline Personally reviewed CT chest done on 01/21/20 showing areas of consolidation in the right upper lobe superimposed on extensive background of suspected emphysema, small bilateral pleural  effusions. Currently on 3 L with O2 saturation 94% at rest. DME oxygen ordered. Wean off oxygen supplementation as tolerated Taper off steroids Follow-up with pulmonary within a week.  UTI ruled out by ID/Enterococcus faecalis/Klebsiella pneumoniae positive urine culture, POA She has a history of recurrent UTI Presented with UA negative for pyuria Urine culture drawn on 01/21/2020 grew >100,000 colonies of Enterococcus faecalis and 10,000 colonies of ESBL Klebsiella pneumoniae, POA ID was consulted to assist with decision to treat or not to treat ESBL Klebsiella pneumoniae, per ID this is a colonization.  ID DC'd meropenem and ampicillin on 01/24/2020.  Elevated BP, improved on Norvasc. Not on oral antihypertensives prior to admission Started on low-dose Norvasc 2.5 mg daily Last BP improved, 135/82 with heart rate of 84 from 180/55. Follow-up with your PCP within a week See above.  Leukocytosis, likely reactive in the setting of IV steroids use Afebrile Continue Cefdinir outpatient Taper off steroids with prednisone x 10 days Follow up with your PCP in 1-2 weeks and pulmonary within a week  Chronic A. fib Not on any rate control agent Continue Eliquis for CVA prevention.  Generalized weakness PT assessment recommended home health PT OT. Continue fall precautions   Code Status: Full code  Family Communication: Updated her daughter at bedside on 01/23/2020 and 01/24/2020.  Disposition Plan: Likely will DC to home with home health services.   Consultants: None.  Procedures:  None.  Antimicrobials:  Rocephin  Azithromycin  Amoxicillin started on 01/23/2020 and DC'd on 01/24/2020 by ID.  Meropenem started on 01/24/2020, DC'd by ID  DVT prophylaxis: Eliquis.   Procedures:  None.  Consultations:  Infectious disease  Discharge Exam: BP (!) 195/93 (BP Location: Left Arm)   Pulse 72   Temp 98.5 F (36.9 C) (Oral)   Resp 20   Wt 61.7 kg    LMP  (LMP Unknown)   SpO2 97%   BMI 26.56 kg/m  . General: 86 y.o. year-old female well developed well nourished in no acute distress.  Alert, interactive, pleasant. . Cardiovascular: Regular rate and rhythm with no rubs or gallops.  No thyromegaly or JVD noted.   Marland Kitchen Respiratory: Mild rales at bases.  No wheezing noted.  Good inspiratory effort.   . Abdomen: Soft nontender nondistended with normal bowel sounds x4 quadrants. . Musculoskeletal: No lower extremity edema. 2/4 pulses in all 4 extremities. Marland Kitchen Psychiatry: Mood is appropriate for condition and setting  Discharge Instructions You were cared for by a hospitalist during your hospital stay. If you have any questions about your discharge medications or the care you received while you were in the hospital after you are discharged, you can call the unit and asked to speak with the hospitalist on call if the hospitalist that took care of you is not available. Once you are discharged, your primary care physician will handle any further medical issues. Please note that NO REFILLS for any discharge medications will be authorized once you are discharged, as it is imperative that you return to your primary care physician (or establish a relationship with a primary care physician if you do not have one) for your aftercare needs so that they can reassess your need for medications  and monitor your lab values.   Allergies as of 01/25/2020      Reactions   Bee Venom Anaphylaxis   Mixed Vespid Venom Anaphylaxis   Codeine Other (See Comments)   Not known   Sulfa Antibiotics Itching, Nausea Only   Erythromycin Nausea And Vomiting      Medication List    STOP taking these medications   doxycycline 100 MG tablet Commonly known as: VIBRA-TABS     TAKE these medications   amLODipine 2.5 MG tablet Commonly known as: NORVASC Take 1 tablet (2.5 mg total) by mouth daily.   atorvastatin 10 MG tablet Commonly known as: LIPITOR Take 10 mg by mouth at  bedtime.   cefdinir 300 MG capsule Commonly known as: OMNICEF Take 1 capsule (300 mg total) by mouth every 12 (twelve) hours for 6 days.   Eliquis 5 MG Tabs tablet Generic drug: apixaban TAKE 1 TABLET BY MOUTH TWICE DAILY. What changed: how much to take   feeding supplement Liqd Take 237 mLs by mouth 3 (three) times daily between meals for 7 days.   mirtazapine 15 MG tablet Commonly known as: REMERON Take 15 mg by mouth at bedtime.   multivitamin with minerals Tabs tablet Take 1 tablet by mouth daily.   nitrofurantoin 100 MG capsule Commonly known as: MACRODANTIN Take 100 mg by mouth at bedtime.   predniSONE 5 MG tablet Commonly known as: DELTASONE Take 8 tablets (40 mg total) by mouth daily with breakfast. Take 40 mg daily x2 days, then, Take 30 mg daily x2 days, then, Take 20 mg daily x2 days, then, Take 10 mg daily x2 days, then, Take 5 mg daily x2 days, then, stop.   saccharomyces boulardii 250 MG capsule Commonly known as: FLORASTOR Take 1 capsule (250 mg total) by mouth 2 (two) times daily for 10 days.   VITAMIN B12 PO Take 1 tablet by mouth at bedtime.   vitamin D3 50 MCG (2000 UT) Caps Take 2,000 Units by mouth at bedtime.            Durable Medical Equipment  (From admission, onward)         Start     Ordered   01/24/20 1145  For home use only DME oxygen  Once       Question Answer Comment  Length of Need 6 Months   Mode or (Route) Nasal cannula   Liters per Minute 3   Frequency Continuous (stationary and portable oxygen unit needed)   Oxygen conserving device Yes   Oxygen delivery system Gas      01/24/20 1145   01/24/20 0700  For home use only DME Walker rolling  Once       Question Answer Comment  Walker: With 5 Inch Wheels   Patient needs a walker to treat with the following condition Ambulatory dysfunction      01/24/20 0624         Allergies  Allergen Reactions  . Bee Venom Anaphylaxis  . Mixed Vespid Venom Anaphylaxis  .  Codeine Other (See Comments)    Not known  . Sulfa Antibiotics Itching and Nausea Only  . Erythromycin Nausea And Vomiting    Follow-up Information    Crist Infante, MD. Call in 1 day(s).   Specialty: Internal Medicine Why: Please call for a post hospital follow-up appointment. Contact information: [place] [address] [phone]        Jerline Pain, MD .   Specialty: Cardiology Contact  information: [address] [place] [address] [phone]        Nyoka Cowden, MD. Call in 1 day(s).   Specialty: Pulmonary Disease Why: Please call for a post hospital follow-up appointment within a week. Contact information: [address] [place] [address] [phone]        [facility] Follow up.   Why: Your home health has been set up with [facility]. If you have any questions or concerns please call number listed above. The office will call you with start of service date.   Contact information: [phone]               The results of significant diagnostics from this hospitalization (including imaging, microbiology, ancillary and laboratory) are listed below for reference.    Significant Diagnostic Studies: DG Chest 2 View  Result Date: 01/12/2020 CLINICAL DATA:  Cough and shortness of breath EXAM: CHEST - 2 VIEW COMPARISON:  December 19, 2019 FINDINGS: The cardiomediastinal silhouette is unchanged in contour.Atherosclerotic calcifications of the aorta. Small LEFT and trace RIGHT pleural effusion. No pneumothorax. Increased diffuse interstitial prominence. Increased RIGHT apical reticular and minimally consolidative opacities. Bibasilar heterogeneous opacities, mild. Visualized abdomen is unremarkable. Multilevel degenerative changes of the thoracic spine. IMPRESSION: 1. Constellation of findings are favored to reflect pulmonary edema with scattered areas of atelectasis. Superimposed infection  remains in the differential. 2. Small bilateral pleural effusions. Electronically Signed   By: Meda Klinefelter MD   On: 01/12/2020 15:22   CT CHEST WO CONTRAST  Result Date: 01/21/2020 CLINICAL DATA:  COPD exacerbation, abnormal chest x-ray EXAM: CT CHEST WITHOUT CONTRAST TECHNIQUE: Multidetector CT imaging of the chest was performed following the standard protocol without IV contrast. COMPARISON:  01/20/2020, 11/14/2019, November 25, 2019 FINDINGS: Cardiovascular: The heart is unremarkable without pericardial effusion. Normal caliber of the thoracic aorta. Mild atherosclerosis of the aorta and coronary vasculature. Mediastinum/Nodes: Borderline enlarged mediastinal lymph nodes are seen, largest measuring 12 mm in short axis right paratracheal region image 38/3. Thyroid, trachea, and esophagus are unremarkable. Lungs/Pleura: Upper lobe predominant emphysema is noted, with bullous changes at the apices, right greater than left. Stable areas of consolidation within the periphery of the left apex are compatible with scarring. There is an area of right upper lobe consolidation new since prior CT 11/25/2019, most pronounced on images 27-33 of series 3, which could reflect superimposed infection. There are trace bilateral pleural effusions. No pneumothorax. Central airways are patent. Upper Abdomen: No acute abnormality. Musculoskeletal: There are no acute or destructive bony lesions. Reconstructed images demonstrate no additional findings. IMPRESSION: 1. Areas of consolidation in the right upper lobe, superimposed upon extensive background emphysema. Favor acute pneumonia. Follow-up imaging is recommended after appropriate medical management to document resolution. 2. Borderline enlarged mediastinal lymph nodes, likely reactive. 3. Small bilateral pleural effusions. 4. Aortic Atherosclerosis (ICD10-I70.0) and Emphysema (ICD10-J43.9). Electronically Signed   By: Sharlet Salina M.D.   On: 01/21/2020 19:06   DG Chest  Port 1 View  Result Date: 01/20/2020 CLINICAL DATA:  Possible sepsis.  Weakness. EXAM: PORTABLE CHEST 1 VIEW COMPARISON:  01/12/2020 FINDINGS: The cardiac silhouette is mildly enlarged. Aortic atherosclerosis is noted. Mild diffuse interstitial densities in both lungs as well as reticulonodular densities in the right greater than left lung apices have not significantly changed. There is likely a persistent trace right pleural effusion. No sizable left pleural effusion is evident. No pneumothorax is identified. No acute osseous abnormality is seen. IMPRESSION: Unchanged  bilateral lung opacities which could reflect infection or edema. Persistent trace right pleural effusion. Electronically Signed   By: Logan Bores M.D.   On: 01/20/2020 01:21    Microbiology: Recent Results (from the past 240 hour(s))  Blood culture (routine single)     Status: None   Collection Time: 01/20/20 12:01 AM   Specimen: BLOOD  Result Value Ref Range Status   Specimen Description BLOOD SITE NOT SPECIFIED  Final   Special Requests   Final    BOTTLES DRAWN AEROBIC AND ANAEROBIC Blood Culture results may not be optimal due to an inadequate volume of blood received in culture bottles   Culture   Final    NO GROWTH 5 DAYS Performed at [facility], [place] [address] [place], [place] [address]    Report Status 01/25/2020 FINAL  Final  Resp Panel by RT-PCR (Flu A&B, Covid) Nasopharyngeal Swab     Status: None   Collection Time: 01/20/20 12:07 AM   Specimen: Nasopharyngeal Swab; Nasopharyngeal(NP) swabs in vial transport medium  Result Value Ref Range Status   SARS Coronavirus 2 by RT PCR NEGATIVE NEGATIVE Final    Comment: (NOTE) SARS-CoV-2 target nucleic acids are NOT DETECTED.  The SARS-CoV-2 RNA is generally detectable in upper respiratory specimens during the acute phase of infection. The lowest concentration of SARS-CoV-2 viral copies this assay can detect is 138 copies/mL. A negative result does not preclude  SARS-Cov-2 infection and should not be used as the sole basis for treatment or other patient management decisions. A negative result may occur with  improper specimen collection/handling, submission of specimen other than nasopharyngeal swab, presence of viral mutation(s) within the areas targeted by this assay, and inadequate number of viral copies(<138 copies/mL). A negative result must be combined with clinical observations, patient history, and epidemiological information. The expected result is Negative.  Fact Sheet for Patients:  EntrepreneurPulse.com.au  Fact Sheet for Healthcare Providers:  IncredibleEmployment.be  This test is no t yet approved or cleared by the [place] FDA and  has been authorized for detection and/or diagnosis of SARS-CoV-2 by FDA under an Emergency Use Authorization (EUA). This EUA will remain  in effect (meaning this test can be used) for the duration of the COVID-19 declaration under Section 564(b)(1) of the Act, 21 U.S.C.section 360bbb-3(b)(1), unless the authorization is terminated  or revoked sooner.       Influenza A by PCR NEGATIVE NEGATIVE Final   Influenza B by PCR NEGATIVE NEGATIVE Final    Comment: (NOTE) The Xpert Xpress SARS-CoV-2/FLU/RSV plus assay is intended as an aid in the diagnosis of influenza from Nasopharyngeal swab specimens and should not be used as a sole basis for treatment. Nasal washings and aspirates are unacceptable for Xpert Xpress SARS-CoV-2/FLU/RSV testing.  Fact Sheet for Patients: EntrepreneurPulse.com.au  Fact Sheet for Healthcare Providers: IncredibleEmployment.be  This test is not yet approved or cleared by the [place] FDA and has been authorized for detection and/or diagnosis of SARS-CoV-2 by FDA under an Emergency Use Authorization (EUA). This EUA will remain in effect (meaning this test can be used) for the duration of  the COVID-19 declaration under Section 564(b)(1) of the Act, 21 U.S.C. section 360bbb-3(b)(1), unless the authorization is terminated or revoked.  Performed at [facility], [place] [address], [place], [place] [address]   Urine culture     Status: Abnormal   Collection Time: 01/21/20  8:02 PM   Specimen: In/Out Cath Urine  Result Value Ref Range Status  Specimen Description IN/OUT CATH URINE  Final   Special Requests   Final    NONE Performed at [facility], [place] [address] [place], [place] [address]    Culture (A)  Final    >=100,000 COLONIES/mL ENTEROCOCCUS FAECALIS 10,000 COLONIES/mL KLEBSIELLA PNEUMONIAE Confirmed Extended Spectrum Beta-Lactamase Producer (ESBL).  In bloodstream infections from ESBL organisms, carbapenems are preferred over piperacillin/tazobactam. They are shown to have a lower risk of mortality.    Report Status 01/24/2020 FINAL  Final   Organism ID, Bacteria ENTEROCOCCUS FAECALIS (A)  Final   Organism ID, Bacteria KLEBSIELLA PNEUMONIAE (A)  Final      Susceptibility   Enterococcus faecalis - MIC*    AMPICILLIN <=2 SENSITIVE Sensitive     NITROFURANTOIN <=16 SENSITIVE Sensitive     VANCOMYCIN 1 SENSITIVE Sensitive     * >=100,000 COLONIES/mL ENTEROCOCCUS FAECALIS   Klebsiella pneumoniae - MIC*    AMPICILLIN >=32 RESISTANT Resistant     CEFAZOLIN >=64 RESISTANT Resistant     CEFEPIME >=32 RESISTANT Resistant     CEFTRIAXONE >=64 RESISTANT Resistant     CIPROFLOXACIN >=4 RESISTANT Resistant     GENTAMICIN >=16 RESISTANT Resistant     IMIPENEM 0.5 SENSITIVE Sensitive     NITROFURANTOIN 256 RESISTANT Resistant     TRIMETH/SULFA >=320 RESISTANT Resistant     AMPICILLIN/SULBACTAM >=32 RESISTANT Resistant     PIP/TAZO >=128 RESISTANT Resistant     * 10,000 COLONIES/mL KLEBSIELLA PNEUMONIAE     Labs: Basic Metabolic Panel: Recent Labs  Lab 01/21/20 0935 01/22/20 0335 01/23/20 0319 01/24/20 0236 01/25/20 0206  NA 142 142 141 141 141  K  3.8 4.9 4.5 3.8 4.3  CL 105 108 106 102 99  CO2 27 27 27 28  32  GLUCOSE 145* 136* 144* 107* 100*  BUN 12 16 16 16 10   CREATININE 0.85 0.66 0.70 0.76 0.71  CALCIUM 9.0 8.8* 8.8* 8.7* 8.6*  MG 2.0 2.2 2.1 2.0 2.2  PHOS 3.3 4.1 3.7 4.7* 5.1*   Liver Function Tests: Recent Labs  Lab 01/21/20 0935 01/22/20 0335 01/23/20 0319 01/24/20 0236 01/25/20 0206  AST 27 22 20 19 19   ALT 23 23 21 24 20   ALKPHOS 58 55 50 54 59  BILITOT 0.6 0.2* 0.2* 0.5 0.6  PROT 6.1* 5.7* 5.6* 5.8* 5.7*  ALBUMIN 2.7* 2.6* 2.5* 2.8* 2.7*   No results for input(s): LIPASE, AMYLASE in the last 168 hours. No results for input(s): AMMONIA in the last 168 hours. CBC: Recent Labs  Lab 01/21/20 0935 01/22/20 0335 01/23/20 0319 01/24/20 0236 01/25/20 0206  WBC 6.9 7.2 8.7 11.1* 7.4  NEUTROABS 5.4 5.6 6.9 7.7 4.5  HGB 10.9* 10.4* 10.2* 11.5* 11.9*  HCT 34.1* 33.3* 32.5* 38.0 37.8  MCV 92.2 93.3 93.7 93.8 91.7  PLT 286 297 360 358 336   Cardiac Enzymes: No results for input(s): CKTOTAL, CKMB, CKMBINDEX, TROPONINI in the last 168 hours. BNP: BNP (last 3 results) Recent Labs    01/20/20 0234  BNP 257.0*    ProBNP (last 3 results) No results for input(s): PROBNP in the last 8760 hours.  CBG: No results for input(s): GLUCAP in the last 168 hours.     Signed:  Kayleen Memos, MD [facility] 01/25/2020, 10:20 AM

## 2020-01-26 DIAGNOSIS — I69312 Visuospatial deficit and spatial neglect following cerebral infarction: Secondary | ICD-10-CM | POA: Diagnosis not present

## 2020-01-26 DIAGNOSIS — J189 Pneumonia, unspecified organism: Secondary | ICD-10-CM | POA: Diagnosis not present

## 2020-01-26 DIAGNOSIS — I69354 Hemiplegia and hemiparesis following cerebral infarction affecting left non-dominant side: Secondary | ICD-10-CM | POA: Diagnosis not present

## 2020-01-26 DIAGNOSIS — I69318 Other symptoms and signs involving cognitive functions following cerebral infarction: Secondary | ICD-10-CM | POA: Diagnosis not present

## 2020-01-26 DIAGNOSIS — B961 Klebsiella pneumoniae [K. pneumoniae] as the cause of diseases classified elsewhere: Secondary | ICD-10-CM | POA: Diagnosis not present

## 2020-01-26 DIAGNOSIS — B952 Enterococcus as the cause of diseases classified elsewhere: Secondary | ICD-10-CM | POA: Diagnosis not present

## 2020-01-26 DIAGNOSIS — I69398 Other sequelae of cerebral infarction: Secondary | ICD-10-CM | POA: Diagnosis not present

## 2020-01-26 DIAGNOSIS — J9601 Acute respiratory failure with hypoxia: Secondary | ICD-10-CM | POA: Diagnosis not present

## 2020-01-26 DIAGNOSIS — I10 Essential (primary) hypertension: Secondary | ICD-10-CM | POA: Diagnosis not present

## 2020-01-26 DIAGNOSIS — R441 Visual hallucinations: Secondary | ICD-10-CM | POA: Diagnosis not present

## 2020-01-26 DIAGNOSIS — N390 Urinary tract infection, site not specified: Secondary | ICD-10-CM | POA: Diagnosis not present

## 2020-01-29 ENCOUNTER — Ambulatory Visit (INDEPENDENT_AMBULATORY_CARE_PROVIDER_SITE_OTHER): Payer: Medicare Other | Admitting: Pulmonary Disease

## 2020-01-29 ENCOUNTER — Encounter: Payer: Self-pay | Admitting: Pulmonary Disease

## 2020-01-29 ENCOUNTER — Encounter: Payer: Medicare Other | Admitting: Physical Medicine & Rehabilitation

## 2020-01-29 ENCOUNTER — Other Ambulatory Visit: Payer: Self-pay

## 2020-01-29 DIAGNOSIS — J189 Pneumonia, unspecified organism: Secondary | ICD-10-CM

## 2020-01-29 DIAGNOSIS — J9601 Acute respiratory failure with hypoxia: Secondary | ICD-10-CM | POA: Diagnosis not present

## 2020-01-29 DIAGNOSIS — J432 Centrilobular emphysema: Secondary | ICD-10-CM | POA: Diagnosis not present

## 2020-01-29 NOTE — Assessment & Plan Note (Signed)
On ambulation oxygen saturation dropped from 95 to 93%, heart rate increased from 83-1 1 6. We have asked her to stay off oxygen for longer durations and if she is very able to do this for the next 1 to 2 weeks, then we can send an order to discontinue oxygen

## 2020-01-29 NOTE — Progress Notes (Signed)
Subjective:    Patient ID: [name], female    DOB: 11-05-1931, 87 y.o.   MRN: [id number]  HPI  87 year old presents to establish care after recent hospitalization for pneumonia.  PMHx COPD (emphysema), A.Fib on Eliquisfollowing a stroke early Nov, recurrent UTIs on macrodantin R thalamic, cerebellar, and frontal small infarcts with right PCA occlusion s/p tPA and IR with TICI3 from AF not on [facility]  She was treated for community-acquired pneumonia in October. She was then hospitalized for acute ischemic right PCA CVA 10/2019 status post TPA and mechanical thrombectomy.  She developed Salmonella infection in the hospital.  She was placed on Eliquis   She was hospitalized 12/25-12/31 for generalized weakness for 1 month, fevers, had been placed on doxycycline after diagnosed with pneumonia on 12/18.  Found to have saturation of 80% on room air improved with 2 L oxygen, chest x-ray showed bibasilar infiltrates, leukocytosis 11.6 and fever 101.4 Urine culture 12/27 showed E faecalis and 10 k colonies of ESBL Klebsiella -ID felt this was a colonizer .  She was treated with 4 days of ceftriaxone/azithromycin then switched to cefdinir to complete 10-day course.  She was discharged on oxygen 2 L  I reviewed chest x-ray 12/26 which shows right upper lobe airspace disease  Since discharge she states that she is feeling better, complains of mild shortness of breath on ambulation, per son daughter who accompanies he has a monitor in the room and he notices that she is coughing at night, not productive. She denies wheezing  She is completing course of cefdinir and tapering of prednisone, ambulates with a walker.  He is able to stay off oxygen for short durations  I have reviewed hospital discharge summary, prior neurology notes and CT imaging  Significant tests/ events reviewed  CT chest 01/21/2020 right upper lobe consolidation superimposed on emphysema small bilateral effusions   Past  Medical History:  Diagnosis Date  . Atrial fibrillation ([place])    Prior treatment with amiodarone, held sinus rhythm and drug stopped  . Atrial flutter ([place])    Status post ablation prior to  2009  . Chest pain    Nuclear, 2004, no scar or ischemia, EF 77%  . Ejection fraction    EF 70%, nuclear,  2004  //     . Fall    July, 2014  . H/O bladder repair surgery   . Right Achilles tendinitis   . Sciatica    Past Surgical History:  Procedure Laterality Date  . IR CT HEAD LTD  11/14/2019  . IR PERCUTANEOUS ART THROMBECTOMY/INFUSION INTRACRANIAL INC DIAG ANGIO  11/14/2019  . IR US GUIDE VASC ACCESS RIGHT  11/14/2019  . no surgical hx    . RADIOLOGY WITH ANESTHESIA N/A 11/13/2019   Procedure: IR WITH ANESTHESIA;  Surgeon: Luanne Bras, MD;  Location: [place];  Service: Radiology;  Laterality: N/A;    Allergies  Allergen Reactions  . Bee Venom Anaphylaxis  . Mixed Vespid Venom Anaphylaxis  . Codeine Other (See Comments)    Not known  . Sulfa Antibiotics Itching and Nausea Only  . Erythromycin Nausea And Vomiting    Social History   Socioeconomic History  . Marital status: Married    Spouse name: Not on file  . Number of children: Not on file  . Years of education: Not on file  . Highest education level: Not on file  Occupational History  . Not on file  Tobacco Use  . Smoking status: Former Research scientist (life sciences)  .  Smokeless tobacco: Never Used  Vaping Use  . Vaping Use: Never used  Substance and Sexual Activity  . Alcohol use: Yes  . Drug use: Never  . Sexual activity: Not on file  Other Topics Concern  . Not on file  Social History Narrative  . Not on file   Social Determinants of Health   Financial Resource Strain: Not on file  Food Insecurity: Not on file  Transportation Needs: Not on file  Physical Activity: Not on file  Stress: Not on file  Social Connections: Not on file  Intimate Partner Violence: Not on file    Family History  Problem Relation Age of Onset   . Cancer Mother   . Cancer Father        pancreatic cancer  . Heart disease Brother        Review of Systems Mild shortness of breath, no wheezing Nonproductive cough, no hemoptysis No chest pain, palpitations, orthopnea or proximal nocturnal dyspnea Ambulates with walker, unsteady on her feet. Does not have much energy, generalized weakness No fevers    Objective:   Physical Exam  Gen. Pleasant, elderly, well-nourished, in no distress, normal affect ENT - no pallor,icterus, no post nasal drip Neck: No JVD, no thyromegaly, no carotid bruits Lungs: no use of accessory muscles, no dullness to percussion, clear without rales or rhonchi  Cardiovascular: Rhythm regular, heart sounds  normal, no murmurs or gallops, no peripheral edema Abdomen: soft and non-tender, no hepatosplenomegaly, BS normal. Musculoskeletal: No deformities, no cyanosis or clubbing Neuro:  alert, non focal       Assessment & Plan:

## 2020-01-29 NOTE — Assessment & Plan Note (Signed)
Emphysema noted on CT, she quit smoking at age 87. If she does not come off oxygen, then we will pursue PFTs. There is mild fibrosis noted in the upper lobes unable to differentiate this from emphysema and consolidation, may be related to nitrofurantoin. I have asked her to DC nitrofurantoin

## 2020-01-29 NOTE — Patient Instructions (Signed)
Ambulatory saturation. Follow-up in 8 weeks with chest x-ray I am hopeful you will come off oxygen eventually, if this does not happen then we will check lung function tests Stop taking Macrodantin Okay to taper off prednisone as directed

## 2020-01-29 NOTE — Assessment & Plan Note (Signed)
Right upper lobe, aspiration possible but she does not seem to have overt aspiration episodes. She will complete course of cefdinir and then discontinue antibiotic.

## 2020-01-30 DIAGNOSIS — B952 Enterococcus as the cause of diseases classified elsewhere: Secondary | ICD-10-CM | POA: Diagnosis not present

## 2020-01-30 DIAGNOSIS — I69318 Other symptoms and signs involving cognitive functions following cerebral infarction: Secondary | ICD-10-CM | POA: Diagnosis not present

## 2020-01-30 DIAGNOSIS — I69354 Hemiplegia and hemiparesis following cerebral infarction affecting left non-dominant side: Secondary | ICD-10-CM | POA: Diagnosis not present

## 2020-01-30 DIAGNOSIS — B961 Klebsiella pneumoniae [K. pneumoniae] as the cause of diseases classified elsewhere: Secondary | ICD-10-CM | POA: Diagnosis not present

## 2020-01-30 DIAGNOSIS — I69398 Other sequelae of cerebral infarction: Secondary | ICD-10-CM | POA: Diagnosis not present

## 2020-01-30 DIAGNOSIS — N390 Urinary tract infection, site not specified: Secondary | ICD-10-CM | POA: Diagnosis not present

## 2020-01-30 DIAGNOSIS — J9601 Acute respiratory failure with hypoxia: Secondary | ICD-10-CM | POA: Diagnosis not present

## 2020-01-30 DIAGNOSIS — J189 Pneumonia, unspecified organism: Secondary | ICD-10-CM | POA: Diagnosis not present

## 2020-01-30 DIAGNOSIS — I69312 Visuospatial deficit and spatial neglect following cerebral infarction: Secondary | ICD-10-CM | POA: Diagnosis not present

## 2020-01-30 DIAGNOSIS — R441 Visual hallucinations: Secondary | ICD-10-CM | POA: Diagnosis not present

## 2020-01-30 DIAGNOSIS — I10 Essential (primary) hypertension: Secondary | ICD-10-CM | POA: Diagnosis not present

## 2020-01-31 DIAGNOSIS — N3941 Urge incontinence: Secondary | ICD-10-CM | POA: Diagnosis not present

## 2020-01-31 DIAGNOSIS — N3020 Other chronic cystitis without hematuria: Secondary | ICD-10-CM | POA: Diagnosis not present

## 2020-02-01 ENCOUNTER — Telehealth: Payer: Self-pay

## 2020-02-01 DIAGNOSIS — I69318 Other symptoms and signs involving cognitive functions following cerebral infarction: Secondary | ICD-10-CM | POA: Diagnosis not present

## 2020-02-01 DIAGNOSIS — I69398 Other sequelae of cerebral infarction: Secondary | ICD-10-CM | POA: Diagnosis not present

## 2020-02-01 DIAGNOSIS — J9601 Acute respiratory failure with hypoxia: Secondary | ICD-10-CM | POA: Diagnosis not present

## 2020-02-01 DIAGNOSIS — B961 Klebsiella pneumoniae [K. pneumoniae] as the cause of diseases classified elsewhere: Secondary | ICD-10-CM | POA: Diagnosis not present

## 2020-02-01 DIAGNOSIS — N390 Urinary tract infection, site not specified: Secondary | ICD-10-CM | POA: Diagnosis not present

## 2020-02-01 DIAGNOSIS — J189 Pneumonia, unspecified organism: Secondary | ICD-10-CM | POA: Diagnosis not present

## 2020-02-01 DIAGNOSIS — I69312 Visuospatial deficit and spatial neglect following cerebral infarction: Secondary | ICD-10-CM | POA: Diagnosis not present

## 2020-02-01 DIAGNOSIS — R441 Visual hallucinations: Secondary | ICD-10-CM | POA: Diagnosis not present

## 2020-02-01 DIAGNOSIS — I10 Essential (primary) hypertension: Secondary | ICD-10-CM | POA: Diagnosis not present

## 2020-02-01 DIAGNOSIS — B952 Enterococcus as the cause of diseases classified elsewhere: Secondary | ICD-10-CM | POA: Diagnosis not present

## 2020-02-01 DIAGNOSIS — I69354 Hemiplegia and hemiparesis following cerebral infarction affecting left non-dominant side: Secondary | ICD-10-CM | POA: Diagnosis not present

## 2020-02-01 NOTE — Telephone Encounter (Signed)
Renee, PTA from [facility] called stating patient missed visit today due to the passing of her husband.

## 2020-02-05 ENCOUNTER — Telehealth: Payer: Self-pay | Admitting: Internal Medicine

## 2020-02-05 DIAGNOSIS — I69354 Hemiplegia and hemiparesis following cerebral infarction affecting left non-dominant side: Secondary | ICD-10-CM | POA: Diagnosis not present

## 2020-02-05 DIAGNOSIS — N390 Urinary tract infection, site not specified: Secondary | ICD-10-CM | POA: Diagnosis not present

## 2020-02-05 DIAGNOSIS — J9601 Acute respiratory failure with hypoxia: Secondary | ICD-10-CM | POA: Diagnosis not present

## 2020-02-05 DIAGNOSIS — B961 Klebsiella pneumoniae [K. pneumoniae] as the cause of diseases classified elsewhere: Secondary | ICD-10-CM | POA: Diagnosis not present

## 2020-02-05 DIAGNOSIS — J432 Centrilobular emphysema: Secondary | ICD-10-CM

## 2020-02-05 DIAGNOSIS — J189 Pneumonia, unspecified organism: Secondary | ICD-10-CM | POA: Diagnosis not present

## 2020-02-05 DIAGNOSIS — B952 Enterococcus as the cause of diseases classified elsewhere: Secondary | ICD-10-CM | POA: Diagnosis not present

## 2020-02-05 DIAGNOSIS — I69312 Visuospatial deficit and spatial neglect following cerebral infarction: Secondary | ICD-10-CM | POA: Diagnosis not present

## 2020-02-05 DIAGNOSIS — I69318 Other symptoms and signs involving cognitive functions following cerebral infarction: Secondary | ICD-10-CM | POA: Diagnosis not present

## 2020-02-05 DIAGNOSIS — I69398 Other sequelae of cerebral infarction: Secondary | ICD-10-CM | POA: Diagnosis not present

## 2020-02-05 DIAGNOSIS — R441 Visual hallucinations: Secondary | ICD-10-CM | POA: Diagnosis not present

## 2020-02-05 DIAGNOSIS — I10 Essential (primary) hypertension: Secondary | ICD-10-CM | POA: Diagnosis not present

## 2020-02-05 NOTE — Telephone Encounter (Signed)
Called and spoke with pt's daughter Mayer Masker letting her know that we were going to place Rx for pt to have O2 discontinued. Sidonie verbalized understanding. Order placed. Nothing further needed.

## 2020-02-05 NOTE — Telephone Encounter (Signed)
Called and spoke with daughter Mayer Masker who states that patient was walked and told that she doesn't need oxygen anymore. Wanted to make sure this is correct and if so if an order can be sent in for them to pick it up.  Dr. Elsworth Soho please advise

## 2020-02-05 NOTE — Telephone Encounter (Signed)
That is correct. Okay to discontinue oxygen

## 2020-02-06 ENCOUNTER — Telehealth: Payer: Self-pay

## 2020-02-06 DIAGNOSIS — I69312 Visuospatial deficit and spatial neglect following cerebral infarction: Secondary | ICD-10-CM | POA: Diagnosis not present

## 2020-02-06 DIAGNOSIS — B952 Enterococcus as the cause of diseases classified elsewhere: Secondary | ICD-10-CM | POA: Diagnosis not present

## 2020-02-06 DIAGNOSIS — J9601 Acute respiratory failure with hypoxia: Secondary | ICD-10-CM | POA: Diagnosis not present

## 2020-02-06 DIAGNOSIS — I69318 Other symptoms and signs involving cognitive functions following cerebral infarction: Secondary | ICD-10-CM | POA: Diagnosis not present

## 2020-02-06 DIAGNOSIS — R441 Visual hallucinations: Secondary | ICD-10-CM | POA: Diagnosis not present

## 2020-02-06 DIAGNOSIS — I69398 Other sequelae of cerebral infarction: Secondary | ICD-10-CM | POA: Diagnosis not present

## 2020-02-06 DIAGNOSIS — I69354 Hemiplegia and hemiparesis following cerebral infarction affecting left non-dominant side: Secondary | ICD-10-CM | POA: Diagnosis not present

## 2020-02-06 DIAGNOSIS — I10 Essential (primary) hypertension: Secondary | ICD-10-CM | POA: Diagnosis not present

## 2020-02-06 DIAGNOSIS — B961 Klebsiella pneumoniae [K. pneumoniae] as the cause of diseases classified elsewhere: Secondary | ICD-10-CM | POA: Diagnosis not present

## 2020-02-06 DIAGNOSIS — N390 Urinary tract infection, site not specified: Secondary | ICD-10-CM | POA: Diagnosis not present

## 2020-02-06 DIAGNOSIS — J189 Pneumonia, unspecified organism: Secondary | ICD-10-CM | POA: Diagnosis not present

## 2020-02-06 NOTE — Telephone Encounter (Signed)
[name], OT from [facility] called requesting verbal orders for [name] 2wk2 to continue. Pt was readmitted 12/2019. Patient saw Zella Ball 12/17/2019. Next appt 02/12/20. Is it okay for OT to continue?

## 2020-02-07 ENCOUNTER — Telehealth: Payer: Medicare Other

## 2020-02-07 NOTE — Telephone Encounter (Signed)
Okay for home health OT to continue

## 2020-02-08 DIAGNOSIS — N390 Urinary tract infection, site not specified: Secondary | ICD-10-CM | POA: Diagnosis not present

## 2020-02-08 DIAGNOSIS — I69318 Other symptoms and signs involving cognitive functions following cerebral infarction: Secondary | ICD-10-CM | POA: Diagnosis not present

## 2020-02-08 DIAGNOSIS — I69312 Visuospatial deficit and spatial neglect following cerebral infarction: Secondary | ICD-10-CM | POA: Diagnosis not present

## 2020-02-08 DIAGNOSIS — I10 Essential (primary) hypertension: Secondary | ICD-10-CM | POA: Diagnosis not present

## 2020-02-08 DIAGNOSIS — I69398 Other sequelae of cerebral infarction: Secondary | ICD-10-CM | POA: Diagnosis not present

## 2020-02-08 DIAGNOSIS — R441 Visual hallucinations: Secondary | ICD-10-CM | POA: Diagnosis not present

## 2020-02-08 DIAGNOSIS — B961 Klebsiella pneumoniae [K. pneumoniae] as the cause of diseases classified elsewhere: Secondary | ICD-10-CM | POA: Diagnosis not present

## 2020-02-08 DIAGNOSIS — B952 Enterococcus as the cause of diseases classified elsewhere: Secondary | ICD-10-CM | POA: Diagnosis not present

## 2020-02-08 DIAGNOSIS — I69354 Hemiplegia and hemiparesis following cerebral infarction affecting left non-dominant side: Secondary | ICD-10-CM | POA: Diagnosis not present

## 2020-02-08 DIAGNOSIS — J9601 Acute respiratory failure with hypoxia: Secondary | ICD-10-CM | POA: Diagnosis not present

## 2020-02-08 DIAGNOSIS — J189 Pneumonia, unspecified organism: Secondary | ICD-10-CM | POA: Diagnosis not present

## 2020-02-08 NOTE — Telephone Encounter (Signed)
[name], [place] notified.

## 2020-02-09 DIAGNOSIS — G4700 Insomnia, unspecified: Secondary | ICD-10-CM | POA: Diagnosis not present

## 2020-02-09 DIAGNOSIS — H9190 Unspecified hearing loss, unspecified ear: Secondary | ICD-10-CM | POA: Diagnosis not present

## 2020-02-09 DIAGNOSIS — M5430 Sciatica, unspecified side: Secondary | ICD-10-CM | POA: Diagnosis not present

## 2020-02-09 DIAGNOSIS — K5901 Slow transit constipation: Secondary | ICD-10-CM | POA: Diagnosis not present

## 2020-02-09 DIAGNOSIS — I482 Chronic atrial fibrillation, unspecified: Secondary | ICD-10-CM | POA: Diagnosis not present

## 2020-02-09 DIAGNOSIS — Z87440 Personal history of urinary (tract) infections: Secondary | ICD-10-CM | POA: Diagnosis not present

## 2020-02-09 DIAGNOSIS — I69398 Other sequelae of cerebral infarction: Secondary | ICD-10-CM | POA: Diagnosis not present

## 2020-02-09 DIAGNOSIS — Z7901 Long term (current) use of anticoagulants: Secondary | ICD-10-CM | POA: Diagnosis not present

## 2020-02-09 DIAGNOSIS — I69354 Hemiplegia and hemiparesis following cerebral infarction affecting left non-dominant side: Secondary | ICD-10-CM | POA: Diagnosis not present

## 2020-02-09 DIAGNOSIS — R441 Visual hallucinations: Secondary | ICD-10-CM | POA: Diagnosis not present

## 2020-02-09 DIAGNOSIS — Z7951 Long term (current) use of inhaled steroids: Secondary | ICD-10-CM | POA: Diagnosis not present

## 2020-02-09 DIAGNOSIS — E785 Hyperlipidemia, unspecified: Secondary | ICD-10-CM | POA: Diagnosis not present

## 2020-02-09 DIAGNOSIS — I69312 Visuospatial deficit and spatial neglect following cerebral infarction: Secondary | ICD-10-CM | POA: Diagnosis not present

## 2020-02-09 DIAGNOSIS — Z86718 Personal history of other venous thrombosis and embolism: Secondary | ICD-10-CM | POA: Diagnosis not present

## 2020-02-09 DIAGNOSIS — Z8701 Personal history of pneumonia (recurrent): Secondary | ICD-10-CM | POA: Diagnosis not present

## 2020-02-09 DIAGNOSIS — Z9181 History of falling: Secondary | ICD-10-CM | POA: Diagnosis not present

## 2020-02-09 DIAGNOSIS — I69318 Other symptoms and signs involving cognitive functions following cerebral infarction: Secondary | ICD-10-CM | POA: Diagnosis not present

## 2020-02-09 DIAGNOSIS — J439 Emphysema, unspecified: Secondary | ICD-10-CM | POA: Diagnosis not present

## 2020-02-09 DIAGNOSIS — I10 Essential (primary) hypertension: Secondary | ICD-10-CM | POA: Diagnosis not present

## 2020-02-09 DIAGNOSIS — I700 Atherosclerosis of aorta: Secondary | ICD-10-CM | POA: Diagnosis not present

## 2020-02-09 DIAGNOSIS — Z87891 Personal history of nicotine dependence: Secondary | ICD-10-CM | POA: Diagnosis not present

## 2020-02-12 ENCOUNTER — Encounter: Payer: Medicare Other | Admitting: Physical Medicine & Rehabilitation

## 2020-02-13 ENCOUNTER — Telehealth: Payer: Self-pay | Admitting: *Deleted

## 2020-02-13 DIAGNOSIS — I482 Chronic atrial fibrillation, unspecified: Secondary | ICD-10-CM | POA: Diagnosis not present

## 2020-02-13 DIAGNOSIS — J439 Emphysema, unspecified: Secondary | ICD-10-CM | POA: Diagnosis not present

## 2020-02-13 DIAGNOSIS — I69318 Other symptoms and signs involving cognitive functions following cerebral infarction: Secondary | ICD-10-CM | POA: Diagnosis not present

## 2020-02-13 DIAGNOSIS — I10 Essential (primary) hypertension: Secondary | ICD-10-CM | POA: Diagnosis not present

## 2020-02-13 DIAGNOSIS — I69312 Visuospatial deficit and spatial neglect following cerebral infarction: Secondary | ICD-10-CM | POA: Diagnosis not present

## 2020-02-13 DIAGNOSIS — I69354 Hemiplegia and hemiparesis following cerebral infarction affecting left non-dominant side: Secondary | ICD-10-CM | POA: Diagnosis not present

## 2020-02-13 NOTE — Telephone Encounter (Signed)
Manuela Schwartz OT [facility] called back to say that because Mrs Wafer was re hospitalized and her husband died concurrently, her progress has been impeded. She would like to see her 1wk9.  Approval given.

## 2020-02-14 ENCOUNTER — Other Ambulatory Visit: Payer: Self-pay

## 2020-02-14 NOTE — Patient Outreach (Signed)
[place] [facility]) Care Management  02/14/2020  [name] 1932/01/22 [id number]   First telephone outreach attempt to obtain mRS. No answer. Left message for returned call.  Thank you, [place] Care Management Assistant

## 2020-02-15 ENCOUNTER — Other Ambulatory Visit: Payer: Self-pay

## 2020-02-15 ENCOUNTER — Encounter: Payer: Medicare Other | Attending: Registered Nurse | Admitting: Physical Medicine & Rehabilitation

## 2020-02-15 ENCOUNTER — Encounter: Payer: Self-pay | Admitting: Physical Medicine & Rehabilitation

## 2020-02-15 VITALS — BP 118/77 | HR 81 | Temp 97.6°F | Ht 60.0 in | Wt 134.2 lb

## 2020-02-15 DIAGNOSIS — R5381 Other malaise: Secondary | ICD-10-CM

## 2020-02-15 DIAGNOSIS — I10 Essential (primary) hypertension: Secondary | ICD-10-CM | POA: Diagnosis not present

## 2020-02-15 DIAGNOSIS — I69354 Hemiplegia and hemiparesis following cerebral infarction affecting left non-dominant side: Secondary | ICD-10-CM | POA: Diagnosis not present

## 2020-02-15 DIAGNOSIS — R269 Unspecified abnormalities of gait and mobility: Secondary | ICD-10-CM | POA: Diagnosis not present

## 2020-02-15 DIAGNOSIS — I69398 Other sequelae of cerebral infarction: Secondary | ICD-10-CM | POA: Diagnosis not present

## 2020-02-15 DIAGNOSIS — I69318 Other symptoms and signs involving cognitive functions following cerebral infarction: Secondary | ICD-10-CM | POA: Diagnosis not present

## 2020-02-15 DIAGNOSIS — J439 Emphysema, unspecified: Secondary | ICD-10-CM | POA: Diagnosis not present

## 2020-02-15 DIAGNOSIS — I69312 Visuospatial deficit and spatial neglect following cerebral infarction: Secondary | ICD-10-CM | POA: Diagnosis not present

## 2020-02-15 DIAGNOSIS — I482 Chronic atrial fibrillation, unspecified: Secondary | ICD-10-CM | POA: Diagnosis not present

## 2020-02-15 NOTE — Progress Notes (Signed)
Subjective:    Patient ID: [name], female    DOB: 1931/03/24, 87 y.o.   MRN: [id number] 87 year old female with history of A fib/flutter s/p flutter--off AC due to NSR, recent treatment for PNA who was admitted on 11/13/2019 with left hemiparesis and right gaze preference. CTA head unremarkable for acute intracranial process and CTA showed proximal Right PCA occlusion with emphysema and layering left pleural effusion c/w PNA. She underwent cerebral angio with revascularization of R-PCA by Dr. Earleen Newport. MRI/MRA brain done revealing right thalamic infarct with mild SAH in interpeduncular cistern and small areas of acute infarct in right cerebellum and right frontal cortex.  Echocardiogram with EF of 60-65% with severe asymmetric basal septal hypertrophy of LV and mild aortic sclerosis with trivial regurgitation. She completed 7 day course of Augmentin for PNA on 10/26.   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 She underwent ultrasound guided trhombin injection of R-CFA with good results on 10/22. Eliquis added on 10/24. She continued to have progressive drop in H/H 13.3-->10.5-->8.8 with dizziness and nausea and reported worsening of right groin pain on 10/29. CTA abdomen/pelvis showed recurrent pseudoaneurysm.  Dr. Scot Dock consulted for input and recommended repeat thrombin injection which was performed by IR on 10/27. Post procedure scan ultrasound the next day showed closed pseudoaneurysm and Eliquis resumed yesterday. Admit date: 11/23/2019 Discharge date: 12/10/2019  HPI 87 year old female history of right PCA infarct status post revascularization during acute care portion of hospitalization.  She was transferred to inpatient rehab where she made good progress.  She initially was at a mod assist level for mobility and was upgraded to supervision level.  She is living with her daughter.  Her primary complaints are of fatigue.  The patient also lost her  husband recently.  She denies any sleep issues no appetite problems. Interval medical history positive for hospital admission, urosepsis plus minus pneumonia.  The patient has not had any significant respiratory symptoms recently.  She just feels fatigued.  She is now on prophylactic antibiotic for UTI, Keflex 250 mg/day. Patient is able to dress and bathe herself.  She needs some assistance washing her back but otherwise is modified independent in shower using a shower chair.  No falls.  Ambulates with a rolling walker Pain Inventory Average Pain 0 Pain Right Now 0 My pain is Here for follow up Stroke  LOCATION OF PAIN  No Pain BOWEL Number of stools per week: 7 Oral laxative use No  Type of laxative n/a Enema or suppository use No  History of colostomy No  Incontinent No   BLADDER Pads In and out cath, frequency N/A Able to self cath No  Bladder incontinence Yes  Frequent urination Yes  Leakage with coughing No  Difficulty starting stream No  Incomplete bladder emptying No    Mobility use a cane how many minutes can you walk? Maybe 3-4 mins ability to climb steps?  yes do you drive?  no Do you have any goals in this area?  yes  Function retired  Neuro/Psych bladder control problems weakness trouble walking dizziness  Prior Studies Any changes since last visit?  yes x-rays chest xray- pneumonia in 09/2019 & 12/2019  Physicians involved in your care Any changes since last visit?  no   Family History  Problem Relation Age of Onset   Cancer Mother    Cancer Father        pancreatic cancer   Heart disease Brother  Social History   Socioeconomic History   Marital status: Married    Spouse name: Not on file   Number of children: Not on file   Years of education: Not on file   Highest education level: Not on file  Occupational History   Not on file  Tobacco Use   Smoking status: Former Smoker   Smokeless tobacco: Never Used  Brewing technologist Use: Never used  Substance and Sexual Activity   Alcohol use: Yes   Drug use: Never   Sexual activity: Not on file  Other Topics Concern   Not on file  Social History Narrative   Not on file   Social Determinants of Health   Financial Resource Strain: Not on file  Food Insecurity: Not on file  Transportation Needs: Not on file  Physical Activity: Not on file  Stress: Not on file  Social Connections: Not on file   Past Surgical History:  Procedure Laterality Date   IR CT HEAD LTD  11/14/2019   IR PERCUTANEOUS ART THROMBECTOMY/INFUSION INTRACRANIAL INC DIAG ANGIO  11/14/2019   IR US GUIDE VASC ACCESS RIGHT  11/14/2019   no surgical hx     RADIOLOGY WITH ANESTHESIA N/A 11/13/2019   Procedure: IR WITH ANESTHESIA;  Surgeon: Luanne Bras, MD;  Location: [place];  Service: Radiology;  Laterality: N/A;   Past Medical History:  Diagnosis Date   Atrial fibrillation ([place])    Prior treatment with amiodarone, held sinus rhythm and drug stopped   Atrial flutter ([place])    Status post ablation prior to  2009   Chest pain    Nuclear, 2004, no scar or ischemia, EF 77%   Ejection fraction    EF 70%, nuclear,  2004  //      Fall    July, 2014   H/O bladder repair surgery    Right Achilles tendinitis    Sciatica    BP 118/77    Pulse 81    Temp 97.6 F (36.4 C)    Ht 5' (1.524 m)    Wt 134 lb 3.2 oz (60.9 kg)    LMP  (LMP Unknown)    SpO2 96%    BMI 26.21 kg/m   Opioid Risk Score:   Fall Risk Score:  `1  Depression screen PHQ 2/9  Depression screen PHQ 2/9 12/17/2019  Decreased Interest 0  Down, Depressed, Hopeless 0  PHQ - 2 Score 0  Altered sleeping 0  Tired, decreased energy 1  Change in appetite 0  Feeling bad or failure about yourself  0  Trouble concentrating 1  Moving slowly or fidgety/restless 0  Suicidal thoughts 0  PHQ-9 Score 2   Review of Systems  Respiratory: Positive for cough. Negative for wheezing.   Musculoskeletal: Positive  for gait problem.  Neurological: Positive for dizziness and weakness.  All other systems reviewed and are negative.      Objective:   Physical Exam Constitutional:      Appearance: She is normal weight.  HENT:     Head: Normocephalic and atraumatic.  Eyes:     Extraocular Movements: Extraocular movements intact.     Conjunctiva/sclera: Conjunctivae normal.     Pupils: Pupils are equal, round, and reactive to light.  Cardiovascular:     Rate and Rhythm: Normal rate and regular rhythm.     Pulses: Normal pulses.     Heart sounds: Normal heart sounds. No murmur heard.   Pulmonary:  Effort: No respiratory distress.     Breath sounds: Normal breath sounds.  Abdominal:     General: Abdomen is flat. Bowel sounds are normal. There is no distension.     Palpations: Abdomen is soft.  Skin:    General: Skin is warm and dry.  Neurological:     Mental Status: She is alert and oriented to person, place, and time.     Comments: Motor strength is 5/5 bilateral deltoid bicep tricep grip hip flexor knee extension ankle dorsiflexion plantarflexion Minimal dysmetria finger-nose-finger testing bilaterally No dysmetria heel-to-shin testing bilaterally Romberg is negative Dynamic standing balance is fair she is able to toe walk but unable to heel walk unable to perform tandem gait Sensation normal bilateral upper and lower limbs Visual fields are intact confrontation testing Extraocular muscles are intact  Psychiatric:        Mood and Affect: Mood normal.        Behavior: Behavior normal.           Assessment & Plan:  1.  Right PCA infarct main residual is decreased balance as well as fatigue.  We discussed that thalamic strokes can cause problems with normal arousal.  In her case she also has complicating factors including hospitalization for urosepsis plus minus pneumonia as well as the death of her husband.  In addition she has a history of emphysema at least on imaging studies We  discussed the timeframe of recovery from CVA as well as bedrest.  She denies feeling "depressed about her husband" and does not have any vegetative signs so would be hesitant in starting SSRI We discussed her blood pressure which has been generally between 338 -250 systolic.  This is unlikely to cause her symptoms of fatigue she has no orthostatic symptoms. She will continue home health PT OT Follow-up with neurology April 19 Follow-up pulmonology March 1 Follow-up cardiology February 14 encourage patient to discuss her blood pressure medication at that time she was just on a small dose of amlodipine 2.5 mg daily

## 2020-02-18 ENCOUNTER — Ambulatory Visit: Payer: Self-pay

## 2020-02-19 ENCOUNTER — Other Ambulatory Visit: Payer: Self-pay

## 2020-02-19 DIAGNOSIS — I69318 Other symptoms and signs involving cognitive functions following cerebral infarction: Secondary | ICD-10-CM | POA: Diagnosis not present

## 2020-02-19 DIAGNOSIS — I69354 Hemiplegia and hemiparesis following cerebral infarction affecting left non-dominant side: Secondary | ICD-10-CM | POA: Diagnosis not present

## 2020-02-19 DIAGNOSIS — I69312 Visuospatial deficit and spatial neglect following cerebral infarction: Secondary | ICD-10-CM | POA: Diagnosis not present

## 2020-02-19 DIAGNOSIS — I482 Chronic atrial fibrillation, unspecified: Secondary | ICD-10-CM | POA: Diagnosis not present

## 2020-02-19 DIAGNOSIS — I10 Essential (primary) hypertension: Secondary | ICD-10-CM | POA: Diagnosis not present

## 2020-02-19 DIAGNOSIS — J439 Emphysema, unspecified: Secondary | ICD-10-CM | POA: Diagnosis not present

## 2020-02-19 NOTE — Patient Outreach (Signed)
[place] [facility]) Care Management  02/19/2020  [name] 1931-04-18 [id number]   Telephone outreach to patient to obtain mRS was successfully completed. MRS= 3  Thank you, [place] Care Management Assistant

## 2020-02-20 ENCOUNTER — Other Ambulatory Visit: Payer: Self-pay

## 2020-02-20 ENCOUNTER — Ambulatory Visit
Admission: RE | Admit: 2020-02-20 | Discharge: 2020-02-20 | Disposition: A | Discharge status: Home / Self Care | Payer: Medicare Other | Source: Ambulatory Visit | Attending: Interventional Radiology | Admitting: Interventional Radiology

## 2020-02-20 ENCOUNTER — Encounter: Payer: Self-pay | Admitting: *Deleted

## 2020-02-20 DIAGNOSIS — Z9889 Other specified postprocedural states: Secondary | ICD-10-CM | POA: Diagnosis not present

## 2020-02-20 DIAGNOSIS — I729 Aneurysm of unspecified site: Secondary | ICD-10-CM

## 2020-02-20 DIAGNOSIS — I63531 Cerebral infarction due to unspecified occlusion or stenosis of right posterior cerebral artery: Secondary | ICD-10-CM | POA: Diagnosis not present

## 2020-02-20 HISTORY — PX: IR RADIOLOGIST EVAL & MGMT: IMG5224

## 2020-02-20 NOTE — Progress Notes (Signed)
Chief Complaint: Acute Stroke, SP Mechanical thrombectomy, complicated by right CFA pseudoaneurysm treated with percutaneous thrombin injection  Referring Physician(s): Mallie Linnemann  History of Present Illness: [name] is a 87 y.o. female presenting as a scheduled follow up to VIR clinic, SP treatment last October for acute stroke, mechanical thrombectomy, and treatment of post-treatment RCFA pseudoaneurysm.   [name] joins [place] today virtually given the COVID crisis.  Her daughter, [name], was also on the call.  We confirmed her identity using 2 personal identifiers.   [name] presented acutely to [facility] 11/14/19 with symptoms of right MCA stroke.  She was treated by our Neurology and NIR team for proximal right PCA/P1 occlusion via right CFA access.  Full reperfusion was achieved.    MRI imaging after the treatment confirmed small punctate right cerebellar infarct, as well as a small right thalamic infarct.    2 days after treatment, she developed right thigh swelling, and a RCFA pseudoaneurysm was diagnosed at the site of access.  Thrombin injection was performed first by myself on 11/16/19, and then again by my partner Dr. Elby Showers on 11/21/19, after recurrent pain and swelling.    After the second thrombin injection, there was no recurrence.  Incidental imaging of the site on a CT performed for abdominal pain/nausea on 12/20/19 shows no residual pseudoaneurysm.    AFter her hospitalization, she was admitted to rehab, and DC'd from [facility] service 12/07/19.    Today she tells me that she feels as though "she hasnt even had a stroke."  So she feels as though her strength has made complete recovery.  She does, however, have residual weakness and tiredness, with the average day spent resting and napping, greater than 50%.  She wakes typically 10am to 11am to get up, and gets ready independently, but this is very fatiguing.  She does participate with OT/PT during the week at her  house, and also performs these on her own at least once per day.  She is walking around the house independently.  She usually goes to bed around 8pm to 9pm.   She has no residual symptoms at the right hip.    Past Medical History:  Diagnosis Date  . Atrial fibrillation (HCC)    Prior treatment with amiodarone, held sinus rhythm and drug stopped  . Atrial flutter (HCC)    Status post ablation prior to  2009  . Chest pain    Nuclear, 2004, no scar or ischemia, EF 77%  . Ejection fraction    EF 70%, nuclear,  2004  //     . Fall    July, 2014  . H/O bladder repair surgery   . Right Achilles tendinitis   . Sciatica     Past Surgical History:  Procedure Laterality Date  . IR CT HEAD LTD  11/14/2019  . IR PERCUTANEOUS ART THROMBECTOMY/INFUSION INTRACRANIAL INC DIAG ANGIO  11/14/2019  . IR US GUIDE VASC ACCESS RIGHT  11/14/2019  . no surgical hx    . RADIOLOGY WITH ANESTHESIA N/A 11/13/2019   Procedure: IR WITH ANESTHESIA;  Surgeon: Julieanne Cotton, MD;  Location: [facility] OR;  Service: Radiology;  Laterality: N/A;    Allergies: Bee venom, Mixed vespid venom, Codeine, Sulfa antibiotics, and Erythromycin  Medications: Prior to Admission medications   Medication Sig Start Date End Date Taking? Authorizing Provider  amLODipine (NORVASC) 2.5 MG tablet Take 1 tablet (2.5 mg total) by mouth daily. 01/25/20 02/24/20  Darlin Drop, DO  atorvastatin (LIPITOR)  10 MG tablet Take 10 mg by mouth at bedtime.    [provider]  cephALEXin (KEFLEX) 250 MG capsule Take 250 mg by mouth daily. 01/31/20   [provider]  Cholecalciferol (VITAMIN D3) 50 MCG (2000 UT) CAPS Take 2,000 Units by mouth at bedtime.    [provider]  Cyanocobalamin (VITAMIN B12 PO) Take 1 tablet by mouth at bedtime.    [provider]  ELIQUIS 5 MG TABS tablet TAKE 1 TABLET BY MOUTH TWICE DAILY. Patient taking differently: Take 5 mg by mouth 2 (two) times daily. 01/07/20   Jerline Pain,  MD  mirtazapine (REMERON) 15 MG tablet Take 15 mg by mouth at bedtime.    [provider]  Multiple Vitamin (MULTIVITAMIN WITH MINERALS) TABS tablet Take 1 tablet by mouth daily. 01/25/20 04/24/20  Kayleen Memos, DO  Vibegron (GEMTESA PO) Take by mouth.    [provider]     Family History  Problem Relation Age of Onset  . Cancer Mother   . Cancer Father        pancreatic cancer  . Heart disease Brother     Social History   Socioeconomic History  . Marital status: Married    Spouse name: Not on file  . Number of children: Not on file  . Years of education: Not on file  . Highest education level: Not on file  Occupational History  . Not on file  Tobacco Use  . Smoking status: Former Research scientist (life sciences)  . Smokeless tobacco: Never Used  Vaping Use  . Vaping Use: Never used  Substance and Sexual Activity  . Alcohol use: Yes  . Drug use: Never  . Sexual activity: Not on file  Other Topics Concern  . Not on file  Social History Narrative  . Not on file   Social Determinants of Health   Financial Resource Strain: Not on file  Food Insecurity: Not on file  Transportation Needs: Not on file  Physical Activity: Not on file  Stress: Not on file  Social Connections: Not on file      Review of Systems  Review of Systems: A 12 point ROS discussed and pertinent positives are indicated in the HPI above.  All other systems are negative.  Physical Exam No direct physical exam was performed (except for noted visual exam findings with Video Visits).     Vital Signs: LMP  (LMP Unknown)   Imaging: CT CHEST WO CONTRAST  Result Date: 01/21/2020 CLINICAL DATA:  COPD exacerbation, abnormal chest x-ray EXAM: CT CHEST WITHOUT CONTRAST TECHNIQUE: Multidetector CT imaging of the chest was performed following the standard protocol without IV contrast. COMPARISON:  01/20/2020, 11/14/2019, 09-Dec-2019 FINDINGS: Cardiovascular: The heart is unremarkable without pericardial  effusion. Normal caliber of the thoracic aorta. Mild atherosclerosis of the aorta and coronary vasculature. Mediastinum/Nodes: Borderline enlarged mediastinal lymph nodes are seen, largest measuring 12 mm in short axis right paratracheal region image 38/3. Thyroid, trachea, and esophagus are unremarkable. Lungs/Pleura: Upper lobe predominant emphysema is noted, with bullous changes at the apices, right greater than left. Stable areas of consolidation within the periphery of the left apex are compatible with scarring. There is an area of right upper lobe consolidation new since prior CT 12-09-19, most pronounced on images 27-33 of series 3, which could reflect superimposed infection. There are trace bilateral pleural effusions. No pneumothorax. Central airways are patent. Upper Abdomen: No acute abnormality. Musculoskeletal: There are no acute or destructive bony lesions. Reconstructed images demonstrate  no additional findings. IMPRESSION: 1. Areas of consolidation in the right upper lobe, superimposed upon extensive background emphysema. Favor acute pneumonia. Follow-up imaging is recommended after appropriate medical management to document resolution. 2. Borderline enlarged mediastinal lymph nodes, likely reactive. 3. Small bilateral pleural effusions. 4. Aortic Atherosclerosis (ICD10-I70.0) and Emphysema (ICD10-J43.9). Electronically Signed   By: Randa Ngo M.D.   On: 01/21/2020 19:06    Labs:  CBC: Recent Labs    01/22/20 0335 01/23/20 0319 01/24/20 0236 01/25/20 0206  WBC 7.2 8.7 11.1* 7.4  HGB 10.4* 10.2* 11.5* 11.9*  HCT 33.3* 32.5* 38.0 37.8  PLT 297 360 358 336    COAGS: Recent Labs    11/13/19 2244 01/19/20 2353  INR 1.0 1.4*  APTT 31 32    BMP: Recent Labs    01/22/20 0335 01/23/20 0319 01/24/20 0236 01/25/20 0206  NA 142 141 141 141  K 4.9 4.5 3.8 4.3  CL 108 106 102 99  CO2 27 27 28  32  GLUCOSE 136* 144* 107* 100*  BUN 16 16 16 10   CALCIUM 8.8* 8.8* 8.7* 8.6*   CREATININE 0.66 0.70 0.76 0.71  GFRNONAA >60 >60 >60 >60    LIVER FUNCTION TESTS: Recent Labs    01/22/20 0335 01/23/20 0319 01/24/20 0236 01/25/20 0206  BILITOT 0.2* 0.2* 0.5 0.6  AST 22 20 19 19   ALT 23 21 24 20   ALKPHOS 55 50 54 59  PROT 5.7* 5.6* 5.8* 5.7*  ALBUMIN 2.6* 2.5* 2.8* 2.7*    TUMOR MARKERS: No results for input(s): AFPTM, CEA, CA199, CHROMGRNA in the last 8760 hours.  Assessment and Plan:  Melissa Deford is an 87 year old female SP treatment of right PCA acute ELVO stroke with mechanical thrombectomy 11/14/19, and then treatment of an access site pseudoaneurysm with percutaneous thrombin injection x 2, with resolution on CT 12/20/19.    She tells me she has completely recovered from the standpoint of her neuro symptoms, and she has essentially been discharged from neurology, with no follow up as of now.  I have encouraged her to continue her maximal medical management. I have also encouraged her to keep participating with the OT/PT which is important given her frail condition.   From the standpoint of the pseudoaneurysm, we can consider this treated and need no further surveillance.    She understands that we will not, at this time, schedule a follow up, and she is very thankful for her care with our team.    Electronically Signed: Corrie Mckusick 02/20/2020, 9:45 AM   I spent a total of    25 Minutes in remote  clinical consultation, greater than 50% of which was counseling/coordinating care for treatment of acute right PCA stroke/ELVO, SP mechanical thrombectomy.    Visit type: Audio only (telephone). Audio (no video) only due to patient's lack of internet/smartphone capability. Alternative for in-person consultation at [facility], [address] [place], [place], [place]. This visit type was conducted due to national recommendations for restrictions regarding the COVID-19 Pandemic (e.g. social distancing).  This format is felt to be most appropriate for this  patient at this time.  All issues noted in this document were discussed and addressed.

## 2020-02-21 DIAGNOSIS — I69312 Visuospatial deficit and spatial neglect following cerebral infarction: Secondary | ICD-10-CM | POA: Diagnosis not present

## 2020-02-21 DIAGNOSIS — I69354 Hemiplegia and hemiparesis following cerebral infarction affecting left non-dominant side: Secondary | ICD-10-CM | POA: Diagnosis not present

## 2020-02-21 DIAGNOSIS — I10 Essential (primary) hypertension: Secondary | ICD-10-CM | POA: Diagnosis not present

## 2020-02-21 DIAGNOSIS — I482 Chronic atrial fibrillation, unspecified: Secondary | ICD-10-CM | POA: Diagnosis not present

## 2020-02-21 DIAGNOSIS — I69318 Other symptoms and signs involving cognitive functions following cerebral infarction: Secondary | ICD-10-CM | POA: Diagnosis not present

## 2020-02-21 DIAGNOSIS — J439 Emphysema, unspecified: Secondary | ICD-10-CM | POA: Diagnosis not present

## 2020-02-22 DIAGNOSIS — I482 Chronic atrial fibrillation, unspecified: Secondary | ICD-10-CM | POA: Diagnosis not present

## 2020-02-22 DIAGNOSIS — I10 Essential (primary) hypertension: Secondary | ICD-10-CM | POA: Diagnosis not present

## 2020-02-22 DIAGNOSIS — I69312 Visuospatial deficit and spatial neglect following cerebral infarction: Secondary | ICD-10-CM | POA: Diagnosis not present

## 2020-02-22 DIAGNOSIS — J439 Emphysema, unspecified: Secondary | ICD-10-CM | POA: Diagnosis not present

## 2020-02-22 DIAGNOSIS — I69318 Other symptoms and signs involving cognitive functions following cerebral infarction: Secondary | ICD-10-CM | POA: Diagnosis not present

## 2020-02-22 DIAGNOSIS — I69354 Hemiplegia and hemiparesis following cerebral infarction affecting left non-dominant side: Secondary | ICD-10-CM | POA: Diagnosis not present

## 2020-02-26 DIAGNOSIS — I482 Chronic atrial fibrillation, unspecified: Secondary | ICD-10-CM | POA: Diagnosis not present

## 2020-02-26 DIAGNOSIS — I10 Essential (primary) hypertension: Secondary | ICD-10-CM | POA: Diagnosis not present

## 2020-02-26 DIAGNOSIS — I69312 Visuospatial deficit and spatial neglect following cerebral infarction: Secondary | ICD-10-CM | POA: Diagnosis not present

## 2020-02-26 DIAGNOSIS — J439 Emphysema, unspecified: Secondary | ICD-10-CM | POA: Diagnosis not present

## 2020-02-26 DIAGNOSIS — I69354 Hemiplegia and hemiparesis following cerebral infarction affecting left non-dominant side: Secondary | ICD-10-CM | POA: Diagnosis not present

## 2020-02-26 DIAGNOSIS — I69318 Other symptoms and signs involving cognitive functions following cerebral infarction: Secondary | ICD-10-CM | POA: Diagnosis not present

## 2020-02-27 DIAGNOSIS — I10 Essential (primary) hypertension: Secondary | ICD-10-CM | POA: Diagnosis not present

## 2020-02-27 DIAGNOSIS — J439 Emphysema, unspecified: Secondary | ICD-10-CM | POA: Diagnosis not present

## 2020-02-27 DIAGNOSIS — I69318 Other symptoms and signs involving cognitive functions following cerebral infarction: Secondary | ICD-10-CM | POA: Diagnosis not present

## 2020-02-27 DIAGNOSIS — I69354 Hemiplegia and hemiparesis following cerebral infarction affecting left non-dominant side: Secondary | ICD-10-CM | POA: Diagnosis not present

## 2020-02-27 DIAGNOSIS — I482 Chronic atrial fibrillation, unspecified: Secondary | ICD-10-CM | POA: Diagnosis not present

## 2020-02-27 DIAGNOSIS — I69312 Visuospatial deficit and spatial neglect following cerebral infarction: Secondary | ICD-10-CM | POA: Diagnosis not present

## 2020-02-28 DIAGNOSIS — H02834 Dermatochalasis of left upper eyelid: Secondary | ICD-10-CM | POA: Diagnosis not present

## 2020-02-28 DIAGNOSIS — H02831 Dermatochalasis of right upper eyelid: Secondary | ICD-10-CM | POA: Diagnosis not present

## 2020-02-28 DIAGNOSIS — D3131 Benign neoplasm of right choroid: Secondary | ICD-10-CM | POA: Diagnosis not present

## 2020-02-28 DIAGNOSIS — H35371 Puckering of macula, right eye: Secondary | ICD-10-CM | POA: Diagnosis not present

## 2020-02-28 DIAGNOSIS — Z961 Presence of intraocular lens: Secondary | ICD-10-CM | POA: Diagnosis not present

## 2020-02-29 DIAGNOSIS — I10 Essential (primary) hypertension: Secondary | ICD-10-CM | POA: Diagnosis not present

## 2020-02-29 DIAGNOSIS — I482 Chronic atrial fibrillation, unspecified: Secondary | ICD-10-CM | POA: Diagnosis not present

## 2020-02-29 DIAGNOSIS — I69354 Hemiplegia and hemiparesis following cerebral infarction affecting left non-dominant side: Secondary | ICD-10-CM | POA: Diagnosis not present

## 2020-02-29 DIAGNOSIS — I69318 Other symptoms and signs involving cognitive functions following cerebral infarction: Secondary | ICD-10-CM | POA: Diagnosis not present

## 2020-02-29 DIAGNOSIS — I69312 Visuospatial deficit and spatial neglect following cerebral infarction: Secondary | ICD-10-CM | POA: Diagnosis not present

## 2020-02-29 DIAGNOSIS — J439 Emphysema, unspecified: Secondary | ICD-10-CM | POA: Diagnosis not present

## 2020-03-03 DIAGNOSIS — I10 Essential (primary) hypertension: Secondary | ICD-10-CM | POA: Diagnosis not present

## 2020-03-03 DIAGNOSIS — I69312 Visuospatial deficit and spatial neglect following cerebral infarction: Secondary | ICD-10-CM | POA: Diagnosis not present

## 2020-03-03 DIAGNOSIS — J439 Emphysema, unspecified: Secondary | ICD-10-CM | POA: Diagnosis not present

## 2020-03-03 DIAGNOSIS — I69318 Other symptoms and signs involving cognitive functions following cerebral infarction: Secondary | ICD-10-CM | POA: Diagnosis not present

## 2020-03-03 DIAGNOSIS — I482 Chronic atrial fibrillation, unspecified: Secondary | ICD-10-CM | POA: Diagnosis not present

## 2020-03-03 DIAGNOSIS — I69354 Hemiplegia and hemiparesis following cerebral infarction affecting left non-dominant side: Secondary | ICD-10-CM | POA: Diagnosis not present

## 2020-03-04 ENCOUNTER — Other Ambulatory Visit: Payer: Self-pay

## 2020-03-04 ENCOUNTER — Ambulatory Visit (INDEPENDENT_AMBULATORY_CARE_PROVIDER_SITE_OTHER): Payer: Medicare Other | Admitting: *Deleted

## 2020-03-04 DIAGNOSIS — T63441D Toxic effect of venom of bees, accidental (unintentional), subsequent encounter: Secondary | ICD-10-CM | POA: Diagnosis not present

## 2020-03-05 DIAGNOSIS — J439 Emphysema, unspecified: Secondary | ICD-10-CM | POA: Diagnosis not present

## 2020-03-05 DIAGNOSIS — I69354 Hemiplegia and hemiparesis following cerebral infarction affecting left non-dominant side: Secondary | ICD-10-CM | POA: Diagnosis not present

## 2020-03-05 DIAGNOSIS — I10 Essential (primary) hypertension: Secondary | ICD-10-CM | POA: Diagnosis not present

## 2020-03-05 DIAGNOSIS — R531 Weakness: Secondary | ICD-10-CM | POA: Diagnosis not present

## 2020-03-05 DIAGNOSIS — E871 Hypo-osmolality and hyponatremia: Secondary | ICD-10-CM | POA: Diagnosis not present

## 2020-03-05 DIAGNOSIS — Z8673 Personal history of transient ischemic attack (TIA), and cerebral infarction without residual deficits: Secondary | ICD-10-CM | POA: Diagnosis not present

## 2020-03-05 DIAGNOSIS — H6123 Impacted cerumen, bilateral: Secondary | ICD-10-CM | POA: Diagnosis not present

## 2020-03-05 DIAGNOSIS — I480 Paroxysmal atrial fibrillation: Secondary | ICD-10-CM | POA: Diagnosis not present

## 2020-03-05 DIAGNOSIS — I482 Chronic atrial fibrillation, unspecified: Secondary | ICD-10-CM | POA: Diagnosis not present

## 2020-03-05 DIAGNOSIS — R630 Anorexia: Secondary | ICD-10-CM | POA: Diagnosis not present

## 2020-03-05 DIAGNOSIS — I69318 Other symptoms and signs involving cognitive functions following cerebral infarction: Secondary | ICD-10-CM | POA: Diagnosis not present

## 2020-03-05 DIAGNOSIS — D6869 Other thrombophilia: Secondary | ICD-10-CM | POA: Diagnosis not present

## 2020-03-05 DIAGNOSIS — I69312 Visuospatial deficit and spatial neglect following cerebral infarction: Secondary | ICD-10-CM | POA: Diagnosis not present

## 2020-03-05 DIAGNOSIS — H60502 Unspecified acute noninfective otitis externa, left ear: Secondary | ICD-10-CM | POA: Diagnosis not present

## 2020-03-05 DIAGNOSIS — R945 Abnormal results of liver function studies: Secondary | ICD-10-CM | POA: Diagnosis not present

## 2020-03-10 ENCOUNTER — Other Ambulatory Visit: Payer: Self-pay

## 2020-03-10 ENCOUNTER — Encounter: Payer: Self-pay | Admitting: Cardiology

## 2020-03-10 ENCOUNTER — Ambulatory Visit (INDEPENDENT_AMBULATORY_CARE_PROVIDER_SITE_OTHER): Payer: Medicare Other | Admitting: Cardiology

## 2020-03-10 VITALS — BP 114/62 | HR 83 | Ht 60.0 in | Wt 139.0 lb

## 2020-03-10 DIAGNOSIS — Z86718 Personal history of other venous thrombosis and embolism: Secondary | ICD-10-CM | POA: Diagnosis not present

## 2020-03-10 DIAGNOSIS — I482 Chronic atrial fibrillation, unspecified: Secondary | ICD-10-CM | POA: Diagnosis not present

## 2020-03-10 DIAGNOSIS — I69354 Hemiplegia and hemiparesis following cerebral infarction affecting left non-dominant side: Secondary | ICD-10-CM | POA: Diagnosis not present

## 2020-03-10 DIAGNOSIS — Z8701 Personal history of pneumonia (recurrent): Secondary | ICD-10-CM | POA: Diagnosis not present

## 2020-03-10 DIAGNOSIS — I480 Paroxysmal atrial fibrillation: Secondary | ICD-10-CM

## 2020-03-10 DIAGNOSIS — Z7901 Long term (current) use of anticoagulants: Secondary | ICD-10-CM | POA: Diagnosis not present

## 2020-03-10 DIAGNOSIS — Z87440 Personal history of urinary (tract) infections: Secondary | ICD-10-CM | POA: Diagnosis not present

## 2020-03-10 DIAGNOSIS — Z87891 Personal history of nicotine dependence: Secondary | ICD-10-CM | POA: Diagnosis not present

## 2020-03-10 DIAGNOSIS — E785 Hyperlipidemia, unspecified: Secondary | ICD-10-CM

## 2020-03-10 DIAGNOSIS — H9190 Unspecified hearing loss, unspecified ear: Secondary | ICD-10-CM | POA: Diagnosis not present

## 2020-03-10 DIAGNOSIS — G4700 Insomnia, unspecified: Secondary | ICD-10-CM | POA: Diagnosis not present

## 2020-03-10 DIAGNOSIS — Z9181 History of falling: Secondary | ICD-10-CM | POA: Diagnosis not present

## 2020-03-10 DIAGNOSIS — I69318 Other symptoms and signs involving cognitive functions following cerebral infarction: Secondary | ICD-10-CM | POA: Diagnosis not present

## 2020-03-10 DIAGNOSIS — K5901 Slow transit constipation: Secondary | ICD-10-CM | POA: Diagnosis not present

## 2020-03-10 DIAGNOSIS — I700 Atherosclerosis of aorta: Secondary | ICD-10-CM | POA: Diagnosis not present

## 2020-03-10 DIAGNOSIS — J439 Emphysema, unspecified: Secondary | ICD-10-CM | POA: Diagnosis not present

## 2020-03-10 DIAGNOSIS — R441 Visual hallucinations: Secondary | ICD-10-CM | POA: Diagnosis not present

## 2020-03-10 DIAGNOSIS — I69312 Visuospatial deficit and spatial neglect following cerebral infarction: Secondary | ICD-10-CM | POA: Diagnosis not present

## 2020-03-10 DIAGNOSIS — I69398 Other sequelae of cerebral infarction: Secondary | ICD-10-CM | POA: Diagnosis not present

## 2020-03-10 DIAGNOSIS — I10 Essential (primary) hypertension: Secondary | ICD-10-CM | POA: Diagnosis not present

## 2020-03-10 DIAGNOSIS — Z7951 Long term (current) use of inhaled steroids: Secondary | ICD-10-CM | POA: Diagnosis not present

## 2020-03-10 DIAGNOSIS — M5430 Sciatica, unspecified side: Secondary | ICD-10-CM | POA: Diagnosis not present

## 2020-03-10 NOTE — Patient Instructions (Signed)

## 2020-03-10 NOTE — Progress Notes (Signed)
Cardiology Office Note:    Date:  03/10/2020   ID:  [name], DOB 01-14-1932, MRN [id number]  PCP:  Crist Infante, MD   [place]  Cardiologist:  Candee Furbish, MD  Advanced Practice Provider:  No care team member to display Electrophysiologist:  None       Referring MD: Crist Infante, MD     History of Present Illness:    [name] is a 87 y.o. female here for the follow-up of atrial fibrillation.  Prior stroke in October 2021.  Proximal right PCA occlusion.  Atrial flutter ablation in 2007 by Dr. Lovena Le.  Her husband [name] died recently.  Grieving.  Her daughter is here as well.  Overall she has been quite weak.  She was hospitalized in December late after her last clinic visit with sepsis.  Had Salmonella as well.  Pneumonia.  Still grieving.  Trying to increase her energy.  She is able to "sleep "for several hours during the night.  Still feels tired.  Past Medical History:  Diagnosis Date  . Atrial fibrillation ([place])    Prior treatment with amiodarone, held sinus rhythm and drug stopped  . Atrial flutter ([place])    Status post ablation prior to  2009  . Chest pain    Nuclear, 2004, no scar or ischemia, EF 77%  . Ejection fraction    EF 70%, nuclear,  2004  //     . Fall    July, 2014  . H/O bladder repair surgery   . Right Achilles tendinitis   . Sciatica     Past Surgical History:  Procedure Laterality Date  . IR CT HEAD LTD  11/14/2019  . IR PERCUTANEOUS ART THROMBECTOMY/INFUSION INTRACRANIAL INC DIAG ANGIO  11/14/2019  . IR RADIOLOGIST EVAL & MGMT  02/20/2020  . IR US GUIDE VASC ACCESS RIGHT  11/14/2019  . no surgical hx    . RADIOLOGY WITH ANESTHESIA N/A 11/13/2019   Procedure: IR WITH ANESTHESIA;  Surgeon: Luanne Bras, MD;  Location: [place];  Service: Radiology;  Laterality: N/A;    Current Medications: Current Meds  Medication Sig  . amLODipine (NORVASC) 2.5 MG tablet Take 1 tablet (2.5 mg total) by mouth daily.   Marland Kitchen atorvastatin (LIPITOR) 10 MG tablet Take 10 mg by mouth at bedtime.  . cephALEXin (KEFLEX) 250 MG capsule Take 250 mg by mouth daily.  . Cholecalciferol (VITAMIN D3) 50 MCG (2000 UT) CAPS Take 2,000 Units by mouth at bedtime.  . Cyanocobalamin (VITAMIN B12 PO) Take 1 tablet by mouth at bedtime.  Marland Kitchen ELIQUIS 5 MG TABS tablet TAKE 1 TABLET BY MOUTH TWICE DAILY.  . mirtazapine (REMERON) 15 MG tablet Take 15 mg by mouth at bedtime.  . Multiple Vitamin (MULTIVITAMIN WITH MINERALS) TABS tablet Take 1 tablet by mouth daily.  . Vibegron (GEMTESA PO) Take by mouth.     Allergies:   Bee venom, Mixed vespid venom, Codeine, Sulfa antibiotics, and Erythromycin   Social History   Socioeconomic History  . Marital status: Married    Spouse name: Not on file  . Number of children: Not on file  . Years of education: Not on file  . Highest education level: Not on file  Occupational History  . Not on file  Tobacco Use  . Smoking status: Former Research scientist (life sciences)  . Smokeless tobacco: Never Used  Vaping Use  . Vaping Use: Never used  Substance and Sexual Activity  . Alcohol use: Yes  .  Drug use: Never  . Sexual activity: Not on file  Other Topics Concern  . Not on file  Social History Narrative  . Not on file   Social Determinants of Health   Financial Resource Strain: Not on file  Food Insecurity: Not on file  Transportation Needs: Not on file  Physical Activity: Not on file  Stress: Not on file  Social Connections: Not on file     Family History: The patient's family history includes Cancer in her father and mother; Heart disease in her brother.  ROS:   Please see the history of present illness.     All other systems reviewed and are negative.  EKGs/Labs/Other Studies Reviewed:    The following studies were reviewed today:  ECHO 11/14/19  1. Left ventricular ejection fraction, by estimation, is 60 to 65%. The  left ventricle has normal function. The left ventricle has no regional   wall motion abnormalities. There is severe asymmetric left ventricular  hypertrophy of the basal-septal  segment. Left ventricular diastolic function could not be evaluated.  2. Right ventricular systolic function is normal. The right ventricular  size is normal. There is mildly elevated pulmonary artery systolic  pressure.  3. Left atrial size was moderately dilated.  4. A small pericardial effusion is present. The pericardial effusion is  circumferential.  5. The mitral valve is normal in structure. Trivial mitral valve  regurgitation. No evidence of mitral stenosis. Moderate mitral annular  calcification.  6. The aortic valve is tricuspid. There is mild calcification of the  aortic valve. There is mild thickening of the aortic valve. Aortic valve  regurgitation is trivial. Mild aortic valve sclerosis is present, with no  evidence of aortic valve stenosis.  7. The inferior vena cava is normal in size with greater than 50%  respiratory variability, suggesting right atrial pressure of 3 mmHg.   Conclusion(s)/Recommendation(s): No intracardiac source of embolism  detected on this transthoracic study. A transesophageal echocardiogram is  recommended to exclude cardiac source of embolism if clinically indicated.  Basal septal hypertrophy of LV that  is severe, otherwise diffuse moderate LVH. No focal wall motion  abnormalities.   EKG:   EKG in October 2021 showed atrial fibrillation rate controlled prior EKG 2019 showed sinus rhythm first-degree AV block with PR interval 230 ms heart rate 86 bpm. Recent Labs: 11/14/2019: TSH 2.314 01/20/2020: B Natriuretic Peptide 257.0 01/25/2020: ALT 20; BUN 10; Creatinine, Ser 0.71; Hemoglobin 11.9; Magnesium 2.2; Platelets 336; Potassium 4.3; Sodium 141  Recent Lipid Panel    Component Value Date/Time   CHOL 70 11/14/2019 0408   TRIG 126 11/14/2019 0408   TRIG 127 11/14/2019 0408   HDL 19 (L) 11/14/2019 0408   CHOLHDL 3.7 11/14/2019  0408   VLDL 25 11/14/2019 0408   LDLCALC 26 11/14/2019 0408     Risk Assessment/Calculations:      Physical Exam:    VS:  BP 114/62 (BP Location: Left Arm, Patient Position: Sitting, Cuff Size: Normal)   Pulse 83   Ht 5' (1.524 m)   Wt 139 lb (63 kg)   LMP  (LMP Unknown)   SpO2 97%   BMI 27.15 kg/m     Wt Readings from Last 3 Encounters:  03/10/20 139 lb (63 kg)  02/15/20 134 lb 3.2 oz (60.9 kg)  01/29/20 140 lb (63.5 kg)     GEN:  Well nourished, well developed in no acute distress HEENT: Normal NECK: No JVD; No carotid bruits LYMPHATICS: No lymphadenopathy  CARDIAC: RRR, no murmurs, rubs, gallops RESPIRATORY:  Clear to auscultation without rales, wheezing or rhonchi  ABDOMEN: Soft, non-tender, non-distended MUSCULOSKELETAL:  No edema; No deformity  SKIN: Warm and dry NEUROLOGIC:  Alert and oriented x 3 PSYCHIATRIC:  Normal affect   ASSESSMENT:    1. Paroxysmal atrial fibrillation (HCC)   2. Hyperlipidemia LDL goal <70    PLAN:    In order of problems listed above:  Atrial fibrillation persistent -Good rate control, Eliquis, no AV nodal blocking agent.  Post stroke.  Encouraged Eliquis use to help decrease stroke risk.  Expense at the beginning of the year has been rough for them.  Atrial flutter post ablation by Dr. Lovena Le.  Stroke -Weakness.  Continue to exercise, walk, to help beget energy.    Medication Adjustments/Labs and Tests Ordered: Current medicines are reviewed at length with the patient today.  Concerns regarding medicines are outlined above.  No orders of the defined types were placed in this encounter.  No orders of the defined types were placed in this encounter.   Patient Instructions  Medication Instructions:  The current medical regimen is effective;  continue present plan and medications.  *If you need a refill on your cardiac medications before your next appointment, please call your pharmacy*  Follow-Up: At [facility],  you and your health needs are our priority.  As part of our continuing mission to provide you with exceptional heart care, we have created designated Provider Care Teams.  These Care Teams include your primary Cardiologist (physician) and Advanced Practice Providers (APPs -  Physician Assistants and Nurse Practitioners) who all work together to provide you with the care you need, when you need it.  We recommend signing up for the patient portal called "MyChart".  Sign up information is provided on this After Visit Summary.  MyChart is used to connect with patients for Virtual Visits (Telemedicine).  Patients are able to view lab/test results, encounter notes, upcoming appointments, etc.  Non-urgent messages can be sent to your provider as well.   To learn more about what you can do with MyChart, go to [email or web address].    Your next appointment:   6 month(s)  The format for your next appointment:   In Person  Provider:   Candee Furbish, MD   Thank you for choosing [facility]!!         Signed, Candee Furbish, MD  03/10/2020 3:39 PM    [place]

## 2020-03-11 DIAGNOSIS — I10 Essential (primary) hypertension: Secondary | ICD-10-CM | POA: Diagnosis not present

## 2020-03-11 DIAGNOSIS — I69354 Hemiplegia and hemiparesis following cerebral infarction affecting left non-dominant side: Secondary | ICD-10-CM | POA: Diagnosis not present

## 2020-03-11 DIAGNOSIS — I482 Chronic atrial fibrillation, unspecified: Secondary | ICD-10-CM | POA: Diagnosis not present

## 2020-03-11 DIAGNOSIS — I69318 Other symptoms and signs involving cognitive functions following cerebral infarction: Secondary | ICD-10-CM | POA: Diagnosis not present

## 2020-03-11 DIAGNOSIS — I69312 Visuospatial deficit and spatial neglect following cerebral infarction: Secondary | ICD-10-CM | POA: Diagnosis not present

## 2020-03-11 DIAGNOSIS — J439 Emphysema, unspecified: Secondary | ICD-10-CM | POA: Diagnosis not present

## 2020-03-18 DIAGNOSIS — I69312 Visuospatial deficit and spatial neglect following cerebral infarction: Secondary | ICD-10-CM | POA: Diagnosis not present

## 2020-03-18 DIAGNOSIS — J439 Emphysema, unspecified: Secondary | ICD-10-CM | POA: Diagnosis not present

## 2020-03-18 DIAGNOSIS — I69354 Hemiplegia and hemiparesis following cerebral infarction affecting left non-dominant side: Secondary | ICD-10-CM | POA: Diagnosis not present

## 2020-03-18 DIAGNOSIS — I69318 Other symptoms and signs involving cognitive functions following cerebral infarction: Secondary | ICD-10-CM | POA: Diagnosis not present

## 2020-03-18 DIAGNOSIS — I482 Chronic atrial fibrillation, unspecified: Secondary | ICD-10-CM | POA: Diagnosis not present

## 2020-03-18 DIAGNOSIS — I10 Essential (primary) hypertension: Secondary | ICD-10-CM | POA: Diagnosis not present

## 2020-03-21 DIAGNOSIS — E871 Hypo-osmolality and hyponatremia: Secondary | ICD-10-CM | POA: Diagnosis not present

## 2020-03-21 DIAGNOSIS — D6869 Other thrombophilia: Secondary | ICD-10-CM | POA: Diagnosis not present

## 2020-03-21 DIAGNOSIS — R413 Other amnesia: Secondary | ICD-10-CM | POA: Diagnosis not present

## 2020-03-21 DIAGNOSIS — H6123 Impacted cerumen, bilateral: Secondary | ICD-10-CM | POA: Diagnosis not present

## 2020-03-21 DIAGNOSIS — I480 Paroxysmal atrial fibrillation: Secondary | ICD-10-CM | POA: Diagnosis not present

## 2020-03-21 DIAGNOSIS — I69318 Other symptoms and signs involving cognitive functions following cerebral infarction: Secondary | ICD-10-CM | POA: Diagnosis not present

## 2020-03-21 DIAGNOSIS — J439 Emphysema, unspecified: Secondary | ICD-10-CM | POA: Diagnosis not present

## 2020-03-21 DIAGNOSIS — I69312 Visuospatial deficit and spatial neglect following cerebral infarction: Secondary | ICD-10-CM | POA: Diagnosis not present

## 2020-03-21 DIAGNOSIS — R945 Abnormal results of liver function studies: Secondary | ICD-10-CM | POA: Diagnosis not present

## 2020-03-21 DIAGNOSIS — I69354 Hemiplegia and hemiparesis following cerebral infarction affecting left non-dominant side: Secondary | ICD-10-CM | POA: Diagnosis not present

## 2020-03-21 DIAGNOSIS — R630 Anorexia: Secondary | ICD-10-CM | POA: Diagnosis not present

## 2020-03-21 DIAGNOSIS — Z8673 Personal history of transient ischemic attack (TIA), and cerebral infarction without residual deficits: Secondary | ICD-10-CM | POA: Diagnosis not present

## 2020-03-21 DIAGNOSIS — I482 Chronic atrial fibrillation, unspecified: Secondary | ICD-10-CM | POA: Diagnosis not present

## 2020-03-21 DIAGNOSIS — H60502 Unspecified acute noninfective otitis externa, left ear: Secondary | ICD-10-CM | POA: Diagnosis not present

## 2020-03-21 DIAGNOSIS — I10 Essential (primary) hypertension: Secondary | ICD-10-CM | POA: Diagnosis not present

## 2020-03-21 DIAGNOSIS — R531 Weakness: Secondary | ICD-10-CM | POA: Diagnosis not present

## 2020-03-24 ENCOUNTER — Other Ambulatory Visit: Payer: Self-pay

## 2020-03-24 DIAGNOSIS — J189 Pneumonia, unspecified organism: Secondary | ICD-10-CM

## 2020-03-25 ENCOUNTER — Ambulatory Visit (INDEPENDENT_AMBULATORY_CARE_PROVIDER_SITE_OTHER): Payer: Medicare Other

## 2020-03-25 ENCOUNTER — Encounter: Payer: Self-pay | Admitting: Pulmonary Disease

## 2020-03-25 ENCOUNTER — Other Ambulatory Visit: Payer: Self-pay

## 2020-03-25 ENCOUNTER — Ambulatory Visit (INDEPENDENT_AMBULATORY_CARE_PROVIDER_SITE_OTHER): Payer: Medicare Other | Admitting: Pulmonary Disease

## 2020-03-25 DIAGNOSIS — J189 Pneumonia, unspecified organism: Secondary | ICD-10-CM

## 2020-03-25 DIAGNOSIS — J432 Centrilobular emphysema: Secondary | ICD-10-CM

## 2020-03-25 DIAGNOSIS — J8410 Pulmonary fibrosis, unspecified: Secondary | ICD-10-CM | POA: Diagnosis not present

## 2020-03-25 NOTE — Progress Notes (Signed)
   Subjective:    Patient ID: [name], female    DOB: 08/21/1931, 87 y.o.   MRN: [id number]  HPI  87 year old remote smoker for follow-up after hospitalization for pneumonia in December 2021  PMHx COPD (emphysema), A.Fib on Eliquisfollowing a stroke early Nov, recurrent UTIs on macrodantin R thalamic, cerebellar, and frontal small infarcts with right PCA occlusion s/p tPA and IR with TICI3 from AF not on [facility]  She was treated for community-acquired pneumonia in October. She was then hospitalized for acute ischemic right PCA CVA 10/2019 status post TPA and mechanical thrombectomy.  She developed Salmonella infection in the hospital.  She was placed on Eliquis   She was hospitalized 12/25-12/31 for generalized weakness for 1 month, fevers, had been placed on doxycycline after diagnosed with pneumonia on 12/18.  Found to have saturation of 80% on room air improved with 2 L oxygen, chest x-ray showed bibasilar infiltrates, leukocytosis 11.6 and fever 101.4 Urine culture 12/27 showed E faecalis and 10 k colonies of ESBL Klebsiella -ID felt this was a colonizer .  She was treated with 4 days of ceftriaxone/azithromycin then switched to cefdinir to complete 10-day course.  She was discharged on oxygen 2 L  Initial OV 01/29/20 >> dc macrodantin, no sig desatn>> stay off O2  Breathing is much improved, daughter reports nocturnal cough, this does not bother her but family can hear her in the next room. She is undergoing home PT She is still on "low-grade antibiotic" probably Macrodantin although not listed on MAR  chest x-ray 12/26  shows right upper lobe airspace disease Chest x-ray today independently reviewed shows resolution of right upper lobe airspace disease   Significant tests/ events reviewed  CT chest 01/21/2020 right upper lobe consolidation superimposed on emphysema small bilateral effusions  Review of Systems neg for any significant sore throat, dysphagia, itching,  sneezing, nasal congestion or excess/ purulent secretions, fever, chills, sweats, unintended wt loss, pleuritic or exertional cp, hempoptysis, orthopnea pnd or change in chronic leg swelling. Also denies presyncope, palpitations, heartburn, abdominal pain, nausea, vomiting, diarrhea or change in bowel or urinary habits, dysuria,hematuria, rash, arthralgias, visual complaints, headache, numbness weakness or ataxia.     Objective:   Physical Exam   Gen. Pleasant, elderly, well-nourished, in no distress ENT - no thrush, no pallor/icterus,no post nasal drip Neck: No JVD, no thyromegaly, no carotid bruits Lungs: no use of accessory muscles, no dullness to percussion, clear without rales or rhonchi  Cardiovascular: Rhythm regular, heart sounds  normal, no murmurs or gallops, no peripheral edema Musculoskeletal: No deformities, no cyanosis or clubbing         Assessment & Plan:    Acute respiratory failure -resolved, DC oxygen

## 2020-03-25 NOTE — Telephone Encounter (Signed)
Please advise on patient mychart message  Mom's medicine to prevent UTIs was changed from nitrofurantoin to cephalexin. Is that acceptable or should she stop taking that?

## 2020-03-25 NOTE — Patient Instructions (Signed)
  Chest x-ray shows resolved pneumonia. STO P taking Macrodantin -this can affect the lungs  Okay to take Delsym cough syrup 5 mL at bedtime for cough

## 2020-03-25 NOTE — Telephone Encounter (Signed)
Cephalexin is okay to continue

## 2020-03-25 NOTE — Assessment & Plan Note (Signed)
Appears much improved on chest x-ray

## 2020-03-25 NOTE — Assessment & Plan Note (Signed)
Noted on CT, mild apical fibrosis. DC Macrodantin. Reassess clinically in 4 months if symptoms persist may need CT

## 2020-03-26 DIAGNOSIS — I69354 Hemiplegia and hemiparesis following cerebral infarction affecting left non-dominant side: Secondary | ICD-10-CM | POA: Diagnosis not present

## 2020-03-26 DIAGNOSIS — I69312 Visuospatial deficit and spatial neglect following cerebral infarction: Secondary | ICD-10-CM | POA: Diagnosis not present

## 2020-03-26 DIAGNOSIS — I482 Chronic atrial fibrillation, unspecified: Secondary | ICD-10-CM | POA: Diagnosis not present

## 2020-03-26 DIAGNOSIS — I69318 Other symptoms and signs involving cognitive functions following cerebral infarction: Secondary | ICD-10-CM | POA: Diagnosis not present

## 2020-03-26 DIAGNOSIS — I10 Essential (primary) hypertension: Secondary | ICD-10-CM | POA: Diagnosis not present

## 2020-03-26 DIAGNOSIS — J439 Emphysema, unspecified: Secondary | ICD-10-CM | POA: Diagnosis not present

## 2020-03-26 DIAGNOSIS — H60332 Swimmer's ear, left ear: Secondary | ICD-10-CM | POA: Diagnosis not present

## 2020-03-27 DIAGNOSIS — J439 Emphysema, unspecified: Secondary | ICD-10-CM | POA: Diagnosis not present

## 2020-03-27 DIAGNOSIS — I10 Essential (primary) hypertension: Secondary | ICD-10-CM | POA: Diagnosis not present

## 2020-03-27 DIAGNOSIS — I69312 Visuospatial deficit and spatial neglect following cerebral infarction: Secondary | ICD-10-CM | POA: Diagnosis not present

## 2020-03-27 DIAGNOSIS — I482 Chronic atrial fibrillation, unspecified: Secondary | ICD-10-CM | POA: Diagnosis not present

## 2020-03-27 DIAGNOSIS — I69318 Other symptoms and signs involving cognitive functions following cerebral infarction: Secondary | ICD-10-CM | POA: Diagnosis not present

## 2020-03-27 DIAGNOSIS — I69354 Hemiplegia and hemiparesis following cerebral infarction affecting left non-dominant side: Secondary | ICD-10-CM | POA: Diagnosis not present

## 2020-04-01 ENCOUNTER — Telehealth: Payer: Self-pay

## 2020-04-01 NOTE — Telephone Encounter (Signed)
[name] missed a Physical Therapy appointment. She was out of town per [place] [facility] with [facility]. Call back phone [phone].

## 2020-04-07 DIAGNOSIS — I69318 Other symptoms and signs involving cognitive functions following cerebral infarction: Secondary | ICD-10-CM | POA: Diagnosis not present

## 2020-04-07 DIAGNOSIS — I10 Essential (primary) hypertension: Secondary | ICD-10-CM | POA: Diagnosis not present

## 2020-04-07 DIAGNOSIS — J439 Emphysema, unspecified: Secondary | ICD-10-CM | POA: Diagnosis not present

## 2020-04-07 DIAGNOSIS — I69354 Hemiplegia and hemiparesis following cerebral infarction affecting left non-dominant side: Secondary | ICD-10-CM | POA: Diagnosis not present

## 2020-04-07 DIAGNOSIS — I69312 Visuospatial deficit and spatial neglect following cerebral infarction: Secondary | ICD-10-CM | POA: Diagnosis not present

## 2020-04-07 DIAGNOSIS — I482 Chronic atrial fibrillation, unspecified: Secondary | ICD-10-CM | POA: Diagnosis not present

## 2020-04-08 DIAGNOSIS — I69312 Visuospatial deficit and spatial neglect following cerebral infarction: Secondary | ICD-10-CM | POA: Diagnosis not present

## 2020-04-08 DIAGNOSIS — I10 Essential (primary) hypertension: Secondary | ICD-10-CM | POA: Diagnosis not present

## 2020-04-08 DIAGNOSIS — I482 Chronic atrial fibrillation, unspecified: Secondary | ICD-10-CM | POA: Diagnosis not present

## 2020-04-08 DIAGNOSIS — J439 Emphysema, unspecified: Secondary | ICD-10-CM | POA: Diagnosis not present

## 2020-04-08 DIAGNOSIS — I69318 Other symptoms and signs involving cognitive functions following cerebral infarction: Secondary | ICD-10-CM | POA: Diagnosis not present

## 2020-04-08 DIAGNOSIS — I69354 Hemiplegia and hemiparesis following cerebral infarction affecting left non-dominant side: Secondary | ICD-10-CM | POA: Diagnosis not present

## 2020-04-08 DIAGNOSIS — H60332 Swimmer's ear, left ear: Secondary | ICD-10-CM | POA: Diagnosis not present

## 2020-04-08 DIAGNOSIS — H9012 Conductive hearing loss, unilateral, left ear, with unrestricted hearing on the contralateral side: Secondary | ICD-10-CM | POA: Diagnosis not present

## 2020-04-09 ENCOUNTER — Other Ambulatory Visit: Payer: Self-pay | Admitting: Otolaryngology

## 2020-04-10 ENCOUNTER — Other Ambulatory Visit: Payer: Self-pay | Admitting: Otolaryngology

## 2020-04-10 DIAGNOSIS — I69320 Aphasia following cerebral infarction: Secondary | ICD-10-CM | POA: Diagnosis not present

## 2020-04-10 DIAGNOSIS — R413 Other amnesia: Secondary | ICD-10-CM | POA: Diagnosis not present

## 2020-04-10 DIAGNOSIS — H9012 Conductive hearing loss, unilateral, left ear, with unrestricted hearing on the contralateral side: Secondary | ICD-10-CM

## 2020-04-10 DIAGNOSIS — D692 Other nonthrombocytopenic purpura: Secondary | ICD-10-CM | POA: Diagnosis not present

## 2020-04-10 DIAGNOSIS — D6869 Other thrombophilia: Secondary | ICD-10-CM | POA: Diagnosis not present

## 2020-04-10 DIAGNOSIS — Z8673 Personal history of transient ischemic attack (TIA), and cerebral infarction without residual deficits: Secondary | ICD-10-CM | POA: Diagnosis not present

## 2020-04-17 DIAGNOSIS — R41841 Cognitive communication deficit: Secondary | ICD-10-CM | POA: Diagnosis not present

## 2020-04-17 DIAGNOSIS — Z8673 Personal history of transient ischemic attack (TIA), and cerebral infarction without residual deficits: Secondary | ICD-10-CM | POA: Diagnosis not present

## 2020-04-17 DIAGNOSIS — R269 Unspecified abnormalities of gait and mobility: Secondary | ICD-10-CM | POA: Diagnosis not present

## 2020-04-23 DIAGNOSIS — R269 Unspecified abnormalities of gait and mobility: Secondary | ICD-10-CM | POA: Diagnosis not present

## 2020-04-23 DIAGNOSIS — Z8673 Personal history of transient ischemic attack (TIA), and cerebral infarction without residual deficits: Secondary | ICD-10-CM | POA: Diagnosis not present

## 2020-04-23 DIAGNOSIS — R41841 Cognitive communication deficit: Secondary | ICD-10-CM | POA: Diagnosis not present

## 2020-04-25 ENCOUNTER — Other Ambulatory Visit: Payer: Self-pay

## 2020-04-25 ENCOUNTER — Ambulatory Visit
Admission: RE | Admit: 2020-04-25 | Discharge: 2020-04-25 | Disposition: A | Discharge status: Home / Self Care | Payer: Medicare Other | Source: Ambulatory Visit | Attending: Otolaryngology | Admitting: Otolaryngology

## 2020-04-25 DIAGNOSIS — H9192 Unspecified hearing loss, left ear: Secondary | ICD-10-CM | POA: Diagnosis not present

## 2020-04-25 DIAGNOSIS — H748X3 Other specified disorders of middle ear and mastoid, bilateral: Secondary | ICD-10-CM | POA: Diagnosis not present

## 2020-04-25 DIAGNOSIS — G319 Degenerative disease of nervous system, unspecified: Secondary | ICD-10-CM | POA: Diagnosis not present

## 2020-04-25 DIAGNOSIS — J322 Chronic ethmoidal sinusitis: Secondary | ICD-10-CM | POA: Diagnosis not present

## 2020-04-25 DIAGNOSIS — H9012 Conductive hearing loss, unilateral, left ear, with unrestricted hearing on the contralateral side: Secondary | ICD-10-CM

## 2020-04-29 ENCOUNTER — Other Ambulatory Visit: Payer: Self-pay

## 2020-04-29 ENCOUNTER — Ambulatory Visit (INDEPENDENT_AMBULATORY_CARE_PROVIDER_SITE_OTHER): Payer: Medicare Other | Admitting: *Deleted

## 2020-04-29 DIAGNOSIS — T63441D Toxic effect of venom of bees, accidental (unintentional), subsequent encounter: Secondary | ICD-10-CM | POA: Diagnosis not present

## 2020-05-13 ENCOUNTER — Ambulatory Visit (INDEPENDENT_AMBULATORY_CARE_PROVIDER_SITE_OTHER): Payer: Medicare Other | Admitting: Adult Health

## 2020-05-13 ENCOUNTER — Encounter: Payer: Self-pay | Admitting: Adult Health

## 2020-05-13 VITALS — BP 129/76 | HR 87 | Ht 60.0 in | Wt 143.0 lb

## 2020-05-13 DIAGNOSIS — I480 Paroxysmal atrial fibrillation: Secondary | ICD-10-CM | POA: Diagnosis not present

## 2020-05-13 DIAGNOSIS — Z9189 Other specified personal risk factors, not elsewhere classified: Secondary | ICD-10-CM | POA: Diagnosis not present

## 2020-05-13 DIAGNOSIS — I63431 Cerebral infarction due to embolism of right posterior cerebral artery: Secondary | ICD-10-CM

## 2020-05-13 NOTE — Patient Instructions (Addendum)
Referral placed to [facility] for evaluation of possible underlying sleep apnea  Continue Eliquis (apixaban) daily  and atorvastatin  for secondary stroke prevention  Continue to follow up with PCP regarding cholesterol and blood pressure management  Maintain strict control of hypertension with blood pressure goal below 130/90 and cholesterol with LDL cholesterol (bad cholesterol) goal below 70 mg/dL.      Followup in the future with me in 6 months or call earlier if needed      Thank you for coming to see [place] at [facility]. I hope we have been able to provide you high quality care today.  You may receive a patient satisfaction survey over the next few weeks. We would appreciate your feedback and comments so that we may continue to improve ourselves and the health of our patients.   Sleep Apnea Sleep apnea affects breathing during sleep. It causes breathing to stop for a short time or to become shallow. It can also increase the risk of:  Heart attack.  Stroke.  Being very overweight (obese).  Diabetes.  Heart failure.  Irregular heartbeat. The goal of treatment is to help you breathe normally again. What are the causes? There are three kinds of sleep apnea:  Obstructive sleep apnea. This is caused by a blocked or collapsed airway.  Central sleep apnea. This happens when the brain does not send the right signals to the muscles that control breathing.  Mixed sleep apnea. This is a combination of obstructive and central sleep apnea. The most common cause of this condition is a collapsed or blocked airway. This can happen if:  Your throat muscles are too relaxed.  Your tongue and tonsils are too large.  You are overweight.  Your airway is too small.   What increases the risk?  Being overweight.  Smoking.  Having a small airway.  Being older.  Being female.  Drinking alcohol.  Taking medicines to calm yourself (sedatives or  tranquilizers).  Having family members with the condition. What are the signs or symptoms?  Trouble staying asleep.  Being sleepy or tired during the day.  Getting angry a lot.  Loud snoring.  Headaches in the morning.  Not being able to focus your mind (concentrate).  Forgetting things.  Less interest in sex.  Mood swings.  Personality changes.  Feelings of sadness (depression).  Waking up a lot during the night to pee (urinate).  Dry mouth.  Sore throat. How is this diagnosed?  Your medical history.  A physical exam.  A test that is done when you are sleeping (sleep study). The test is most often done in a sleep lab but may also be done at home. How is this treated?  Sleeping on your side.  Using a medicine to get rid of mucus in your nose (decongestant).  Avoiding the use of alcohol, medicines to help you relax, or certain pain medicines (narcotics).  Losing weight, if needed.  Changing your diet.  Not smoking.  Using a machine to open your airway while you sleep, such as: ? An oral appliance. This is a mouthpiece that shifts your lower jaw forward. ? A CPAP device. This device blows air through a mask when you breathe out (exhale). ? An EPAP device. This has valves that you put in each nostril. ? A BPAP device. This device blows air through a mask when you breathe in (inhale) and breathe out.  Having surgery if other treatments do not work. It is important  to get treatment for sleep apnea. Without treatment, it can lead to:  High blood pressure.  Coronary artery disease.  In men, not being able to have an erection (impotence).  Reduced thinking ability.   Follow these instructions at home: Lifestyle  Make changes that your doctor recommends.  Eat a healthy diet.  Lose weight if needed.  Avoid alcohol, medicines to help you relax, and some pain medicines.  Do not use any products that contain nicotine or tobacco, such as cigarettes,  e-cigarettes, and chewing tobacco. If you need help quitting, ask your doctor. General instructions  Take over-the-counter and prescription medicines only as told by your doctor.  If you were given a machine to use while you sleep, use it only as told by your doctor.  If you are having surgery, make sure to tell your doctor you have sleep apnea. You may need to bring your device with you.  Keep all follow-up visits as told by your doctor. This is important. Contact a doctor if:  The machine that you were given to use during sleep bothers you or does not seem to be working.  You do not get better.  You get worse. Get help right away if:  Your chest hurts.  You have trouble breathing in enough air.  You have an uncomfortable feeling in your back, arms, or stomach.  You have trouble talking.  One side of your body feels weak.  A part of your face is hanging down. These symptoms may be an emergency. Do not wait to see if the symptoms will go away. Get medical help right away. Call your local emergency services (911 in the [place] Do not drive yourself to the hospital. Summary  This condition affects breathing during sleep.  The most common cause is a collapsed or blocked airway.  The goal of treatment is to help you breathe normally while you sleep. This information is not intended to replace advice given to you by your health care provider. Make sure you discuss any questions you have with your health care provider. Document Revised: 10/28/2017 Document Reviewed: 09/06/2017 Elsevier Patient Education  [place].

## 2020-05-13 NOTE — Progress Notes (Signed)
[facility] [address] [place]. [place] [address] [phone]       STROKE FOLLOW UP NOTE  [name] Date of Birth:  10-20-1931 Medical Record Number:  [id number]   Reason for Referral: stroke follow up  Chief Complaint  Patient presents with  . Follow-up    RM 14 with daughter ([name]) Pt is well, has a lot of fatigue but overall well.      SUBJECTIVE:   HPI:   Today, 05/13/2020, [name] returns for stroke follow-up after prior visit via video visit approximately 4 months ago.  She is accompanied by her daughter, [name].  Stable from stroke standpoint without new stroke/TIA symptoms.  She continues to struggle with daytime fatigue and gait impairment.  Previously working with therapies but currently on hold as she has been traveling but she continues to do exercises routinely at home.  Occasionally ambulates with a cane more as a "safety net" and denies any recent falls.  Compliant on Eliquis and atorvastatin without associated side effects.  Blood pressure today 129/96.  Since prior visit, hospital admission 12/25-12/31 due to severe sepsis secondary to community-acquired pneumonia and possible UTI and has been routinely followed by pulmonology since discharge.  No new concerns at this time.   History provided for reference purposes only Initial visit 01/09/2020 via VV: [name] is being seen for hospital follow-up accompanied by her daughter, [name].  She was discharged home on 12/10/2019 from CIR after a 17-day stay. She does report increased fatigue since her stroke. Denies any weakness, speech/language impairment, or dysphagia.  Does have mild cognitive impairment.  Currently working with [facility] PT/OT/SLP. Denies new or worsening stroke/TIA symptoms. Remains on Eliquis 5 mg twice daily without bleeding or bruising.  Remains on atorvastatin 10 mg daily without myalgias.  Blood pressure monitored by [facility] therapies which has been stable. Dr. Joylene Draft  (PCP) recently started her on Remeron 15mg  nightly to help with appetite stimulation.  Denies depression/anxiety.  No further concerns at this time.  Stroke admission 11/13/2019 [name] a 87 y.o.femalewith history ofAfib/flutter s/p ablation, off AC due to persistent sinus rhythm,and a recent diagnosis of pneumonia(she was not improving on the first round of antibiotics tried and therefore was broadened to Augmentin on 11/12/2019), who presented on 11/13/2019 with acute onset right gaze preference and left-sided weakness.  Personally reviewed hospitalization pertinent progress notes, lab work and imaging with summary provided.  Initially evaluated by Dr. Erlinda Hong with stroke work-up revealing right thalamic, cerebellar and frontal small infarcts with right PCA occlusion s/p tPA and IR with TICI 3 reperfusion w/ postprocedure small SAH, embolic secondary to AF not on AC.  Initially placed on Eliquis but held after developing right groin pseudoaneurysm w/ hematoma post IR receiving thrombin injection 10/22 by Dr. Earleen Newport with repeat injection 10/27 with resolution.  Followed by [facility] Dr. Doren Custard without surgical repair indicated.  Eliquis restarted 10/28 in setting of recent stroke and known atrial fibrillation. Hx of HTN treated with Cleviprex stabilized with long-term BP goal normotensive range. Hx of HLD on atorvastatin 10 mg daily with LDL 26 therefore discontinued due to low LDL level.  Other stroke risk factors include advanced age, former tobacco use and EtOH use but no prior stroke history.  Other active problems include acute respiratory failure (resolved) and community-acquired pneumonia, and hx of emphysema, aortic atherosclerosis, acute blood loss anemia and hypokalemia.  Per therapy recommendations, discharge to CIR on 11/23/2019 for ongoing therapy needs.  Stroke: R  thalamic, cerebellar, and frontal small infarcts with right PCA occlusion s/p tPA and IR with TICI3 from AF not on AC Small SAH  interpeduncular cistern post IR  CT Head - Age indeterminate small vessel disease including in the right thalamus, but no acute cortically based infarct or acute intracranial hemorrhage identified.   CTA H&N - Positive for occlusion of the Right PCA origin.No other large vessel occlusion, and generally mild for age atherosclerosis in the head and neck.   Cerebral angio/ IR- Rt PCA occlusion with TICI 3 flow  CT head - Status post Right PCA endovascular revascularization withtrace subarachnoid blood/contrast in the interpeduncular cistern. Small 10 mm area of contrast staining versus petechial hemorrhage in the medial right thalamus.   MRI head - R thalamus infarct with associated mild hemorrhage as noted on recent CT. There is also mild subarachnoid hemorrhage in the interpeduncular cistern unchangedfrom CT. Additional small areas of acute infarct in the right cerebellum and right frontal cortex. Moderate atrophy   MRA head - Right PCA patent post thrombectomy. There is a mild stenosis in the right PCA at the P1/ P2 junction.   2D Echo - EF 60-65%. No source of embolus. LA moderately dilated  [name] - negative  LDL - 26  HgbA1c- 5.7  VTE prophylaxis - Heparin 5000 units sq tid   aspirin 81 mg dailyprior to admission, resumeeEliquis 10/28  Therapy recommendations: HH OT, HH PT ->CIR   Disposition: CIR       ROS:   14 system review of systems performed and negative with exception of fatigue  PMH:  Past Medical History:  Diagnosis Date  . Atrial fibrillation ([place])    Prior treatment with amiodarone, held sinus rhythm and drug stopped  . Atrial flutter ([place])    Status post ablation prior to  2009  . Chest pain    Nuclear, 2004, no scar or ischemia, EF 77%  . Ejection fraction    EF 70%, nuclear,  2004  //     . Fall    July, 2014  . H/O bladder repair surgery   . Right Achilles tendinitis   . Sciatica     PSH:  Past Surgical History:   Procedure Laterality Date  . IR CT HEAD LTD  11/14/2019  . IR PERCUTANEOUS ART THROMBECTOMY/INFUSION INTRACRANIAL INC DIAG ANGIO  11/14/2019  . IR RADIOLOGIST EVAL & MGMT  02/20/2020  . IR US GUIDE VASC ACCESS RIGHT  11/14/2019  . no surgical hx    . RADIOLOGY WITH ANESTHESIA N/A 11/13/2019   Procedure: IR WITH ANESTHESIA;  Surgeon: Luanne Bras, MD;  Location: [facility];  Service: Radiology;  Laterality: N/A;    Social History:  Social History   Socioeconomic History  . Marital status: Married    Spouse name: Not on file  . Number of children: Not on file  . Years of education: Not on file  . Highest education level: Not on file  Occupational History  . Not on file  Tobacco Use  . Smoking status: Former Smoker    Packs/day: 1.00    Years: 10.00    Pack years: 10.00    Types: Cigarettes  . Smokeless tobacco: Never Used  Vaping Use  . Vaping Use: Never used  Substance and Sexual Activity  . Alcohol use: Yes  . Drug use: Never  . Sexual activity: Not on file  Other Topics Concern  . Not on file  Social History Narrative  . Not  on file   Social Determinants of Health   Financial Resource Strain: Not on file  Food Insecurity: Not on file  Transportation Needs: Not on file  Physical Activity: Not on file  Stress: Not on file  Social Connections: Not on file  Intimate Partner Violence: Not on file    Family History:  Family History  Problem Relation Age of Onset  . Cancer Mother   . Cancer Father        pancreatic cancer  . Heart disease Brother     Medications:   Current Outpatient Medications on File Prior to Visit  Medication Sig Dispense Refill  . atorvastatin (LIPITOR) 10 MG tablet Take 10 mg by mouth at bedtime.    . cephALEXin (KEFLEX) 250 MG capsule Take 250 mg by mouth daily.    . Cholecalciferol (VITAMIN D3) 50 MCG (2000 UT) CAPS Take Lynch,000 Units by mouth at bedtime.    . Cyanocobalamin (VITAMIN B12 PO) Take 1 tablet by mouth at bedtime.    Marland Kitchen  ELIQUIS 5 MG TABS tablet TAKE 1 TABLET BY MOUTH TWICE DAILY. 60 tablet 5  . mirtazapine (REMERON) 15 MG tablet Take 15 mg by mouth at bedtime.    . Vibegron (GEMTESA PO) Take by mouth.    Marland Kitchen amLODipine (NORVASC) Lynch.5 MG tablet Take 1 tablet (Lynch.5 mg total) by mouth daily. 30 tablet 0   No current facility-administered medications on file prior to visit.    Allergies:   Allergies  Allergen Reactions  . Bee Venom Anaphylaxis  . Mixed Vespid Venom Anaphylaxis  . Codeine Other (See Comments)    Not known  . Sulfa Antibiotics Itching and Nausea Only  . Erythromycin Nausea And Vomiting      OBJECTIVE: Today's Vitals   05/13/20 1342  BP: 129/76  Pulse: 87  Weight: 143 lb (64.9 kg)  Height: 5' (1.524 m)   Body mass index is 27.93 kg/m.  General: well developed, well nourished,  very pleasant elderly Caucasian female, seated, in no evident distress Head: head normocephalic and atraumatic.   Neck: supple with no carotid or supraclavicular bruits Cardiovascular: irregular rate and rhythm, no murmurs Musculoskeletal: no deformity Skin:  no rash/petichiae Vascular:  Normal pulses all extremities   Neurologic Exam Mental Status: Awake and fully alert.   Fluent speech and language.  Oriented to place and time. Recent and remote memory intact. Attention span, concentration and fund of knowledge appropriate during visit. Mood and affect appropriate.  Cranial Nerves: Pupils equal, briskly reactive to light. Extraocular movements full without nystagmus. Visual fields full to confrontation. Hearing intact. Facial sensation intact. Face, tongue, palate moves normally and symmetrically.  Motor: Normal bulk and tone. Normal strength in all tested extremity muscles Sensory.: intact to touch , pinprick , position and vibratory sensation.  Coordination: Rapid alternating movements normal in all extremities. Finger-to-nose and heel-to-shin performed accurately bilaterally. Gait and Station: Arises  from chair without difficulty. Stance is normal. Gait demonstrates  broad-based short shuffled steps with mild unsteadiness especially when making turns and use of cane.  Tandem walk and heel toe not attempted due to safety concerns Reflexes: 1+ and symmetric. Toes downgoing.        ASSESSMENT: CATALIA MASSETT is a 87 y.o. year old female presented with acute onset right gaze preference and left-sided weakness on 11/13/2019 with stroke work-up revealing right thalamic, cerebellar and frontal small with right PCA occlusion s/p tPA and IR with TICI 3 reperfusion, embolic secondary to known AF not  on [facility]. S/p IR small SAH and right groin pseudoaneurysm with hematoma.  Vascular risk factors include AF s/p ablation (off AC d/t persistent sinus rhythm), HTN, HLD, advanced age, former tobacco use and EtOH use.      PLAN:  1. R embolic stroke:  a. Recovered well physically with continued gait impairment and excessive daytime fatigue.  Encouraged restarting therapies once able and maintain physical activity as tolerated b. Continue Eliquis (apixaban) daily  and atorvastatin 10 mg daily for secondary stroke prevention.  c. Discussed secondary stroke prevention measures and importance of close PCP follow up for aggressive stroke risk factor management  d. HTN: BP goal <130/90.  Stable with use of amlodipine per PCP e. HLD: LDL goal <70.  Prior LDL 26 on atorvastatin 10 mg daily per PCP.  Plans to follow-up with PCP in the near future for repeat lab work Lynch. Atrial fibrillation, persistent: On Eliquis for CHA2DS2-VASc score 6.  Routinely followed by cardiology 3. At risk for sleep apnea: Referral placed to [place] [facility].  Advised her that untreated sleep apnea may be contributing to continued excessive daytime fatigue especially with history of stroke and known atrial fibrillation    Follow up in 6 months or call earlier if needed   CC:  [facility] provider: Dr. Mont Dutton, MD    I spent 30  minutes of face-to-face and non-face-to-face time with patient and daughter.  This included previsit chart review, lab review, study review, order entry, electronic health record documentation, patient education regarding prior stroke including etiology, residual post stroke fatigue and further evaluation of possible sleep apnea, importance of managing stroke risk factors and answered all other questions to patient and daughters satisfaction  Frann Rider, [facility]  [facility] [address] [place], [place] [address]  Phone [phone] Fax [phone] Note: This document was prepared with digital dictation and possible smart phrase technology. Any transcriptional errors that result from this process are unintentional.

## 2020-05-14 DIAGNOSIS — N3941 Urge incontinence: Secondary | ICD-10-CM | POA: Diagnosis not present

## 2020-05-14 DIAGNOSIS — N3020 Other chronic cystitis without hematuria: Secondary | ICD-10-CM | POA: Diagnosis not present

## 2020-05-19 NOTE — Progress Notes (Signed)
I agree with the above plan 

## 2020-06-09 ENCOUNTER — Encounter (HOSPITAL_COMMUNITY): Payer: Self-pay

## 2020-06-09 ENCOUNTER — Emergency Department (HOSPITAL_COMMUNITY): Payer: Medicare Other

## 2020-06-09 ENCOUNTER — Other Ambulatory Visit: Payer: Self-pay

## 2020-06-09 ENCOUNTER — Emergency Department (HOSPITAL_COMMUNITY)
Admission: EM | Admit: 2020-06-09 | Discharge: 2020-06-09 | Disposition: A | Discharge status: Home / Self Care | Payer: Medicare Other | Attending: Emergency Medicine | Admitting: Emergency Medicine

## 2020-06-09 DIAGNOSIS — G319 Degenerative disease of nervous system, unspecified: Secondary | ICD-10-CM | POA: Diagnosis not present

## 2020-06-09 DIAGNOSIS — Z79899 Other long term (current) drug therapy: Secondary | ICD-10-CM | POA: Diagnosis not present

## 2020-06-09 DIAGNOSIS — J439 Emphysema, unspecified: Secondary | ICD-10-CM | POA: Diagnosis not present

## 2020-06-09 DIAGNOSIS — S92354A Nondisplaced fracture of fifth metatarsal bone, right foot, initial encounter for closed fracture: Secondary | ICD-10-CM | POA: Diagnosis not present

## 2020-06-09 DIAGNOSIS — R29810 Facial weakness: Secondary | ICD-10-CM | POA: Diagnosis not present

## 2020-06-09 DIAGNOSIS — G459 Transient cerebral ischemic attack, unspecified: Secondary | ICD-10-CM | POA: Diagnosis not present

## 2020-06-09 DIAGNOSIS — I10 Essential (primary) hypertension: Secondary | ICD-10-CM | POA: Insufficient documentation

## 2020-06-09 DIAGNOSIS — I4891 Unspecified atrial fibrillation: Secondary | ICD-10-CM | POA: Insufficient documentation

## 2020-06-09 DIAGNOSIS — R29818 Other symptoms and signs involving the nervous system: Secondary | ICD-10-CM

## 2020-06-09 DIAGNOSIS — Z87891 Personal history of nicotine dependence: Secondary | ICD-10-CM | POA: Insufficient documentation

## 2020-06-09 DIAGNOSIS — R4781 Slurred speech: Secondary | ICD-10-CM | POA: Diagnosis not present

## 2020-06-09 DIAGNOSIS — Z7901 Long term (current) use of anticoagulants: Secondary | ICD-10-CM | POA: Diagnosis not present

## 2020-06-09 DIAGNOSIS — R531 Weakness: Secondary | ICD-10-CM | POA: Diagnosis not present

## 2020-06-09 DIAGNOSIS — X58XXXA Exposure to other specified factors, initial encounter: Secondary | ICD-10-CM | POA: Diagnosis not present

## 2020-06-09 DIAGNOSIS — S99921A Unspecified injury of right foot, initial encounter: Secondary | ICD-10-CM | POA: Diagnosis present

## 2020-06-09 LAB — COMPREHENSIVE METABOLIC PANEL
ALT: 28 U/L (ref 0–44)
AST: 30 U/L (ref 15–41)
Albumin: 3.9 g/dL (ref 3.5–5.0)
Alkaline Phosphatase: 70 U/L (ref 38–126)
Anion gap: 11 (ref 5–15)
BUN: 13 mg/dL (ref 8–23)
CO2: 23 mmol/L (ref 22–32)
Calcium: 9 mg/dL (ref 8.9–10.3)
Chloride: 106 mmol/L (ref 98–111)
Creatinine, Ser: 0.99 mg/dL (ref 0.44–1.00)
GFR, Estimated: 55 mL/min — ABNORMAL LOW (ref >60)
Glucose, Bld: 106 mg/dL — ABNORMAL HIGH (ref 70–99)
Potassium: 4.1 mmol/L (ref 3.5–5.1)
Sodium: 140 mmol/L (ref 135–145)
Total Bilirubin: 1.4 mg/dL — ABNORMAL HIGH (ref 0.3–1.2)
Total Protein: 6.7 g/dL (ref 6.5–8.1)

## 2020-06-09 LAB — URINALYSIS, COMPLETE (UACMP) WITH MICROSCOPIC
Bilirubin Urine: NEGATIVE
Glucose, UA: NEGATIVE mg/dL
Hgb urine dipstick: NEGATIVE
Ketones, ur: 5 mg/dL — AB
Nitrite: NEGATIVE
Protein, ur: 30 mg/dL — AB
Specific Gravity, Urine: 1.016 (ref 1.005–1.030)
WBC, UA: >50 WBC/hpf — ABNORMAL HIGH (ref 0–5)
pH: 6 (ref 5.0–8.0)

## 2020-06-09 LAB — CBC
HCT: 44.9 % (ref 36.0–46.0)
Hemoglobin: 14.5 g/dL (ref 12.0–15.0)
MCH: 31 pg (ref 26.0–34.0)
MCHC: 32.3 g/dL (ref 30.0–36.0)
MCV: 95.9 fL (ref 80.0–100.0)
Platelets: 192 10*3/uL (ref 150–400)
RBC: 4.68 MIL/uL (ref 3.87–5.11)
RDW: 13.5 % (ref 11.5–15.5)
WBC: 6 10*3/uL (ref 4.0–10.5)
nRBC: 0 % (ref 0.0–0.2)

## 2020-06-09 LAB — PROTIME-INR
INR: 1.1 (ref 0.8–1.2)
Prothrombin Time: 14 seconds (ref 11.4–15.2)

## 2020-06-09 LAB — RAPID URINE DRUG SCREEN, HOSP PERFORMED
Amphetamines: NOT DETECTED
Barbiturates: NOT DETECTED
Benzodiazepines: NOT DETECTED
Cocaine: NOT DETECTED
Opiates: NOT DETECTED
Tetrahydrocannabinol: NOT DETECTED

## 2020-06-09 LAB — HEMOGLOBIN A1C
Hgb A1c MFr Bld: 5.7 % — ABNORMAL HIGH (ref 4.8–5.6)
Mean Plasma Glucose: 116.89 mg/dL

## 2020-06-09 NOTE — ED Provider Notes (Signed)
[facility] Provider Note   CSN: [id number] Arrival date & time: 06/09/20  1358     History Chief Complaint  Patient presents with  . Weakness    Denies HA, denies left sided weakness, states she just feels weak due to not eating, Dr. Roslynn Amble at bedside    [name] is a 87 y.o. female.  Presented to the emergency room with chief complaint of episode of weakness and facial droop.  Son reports that this morning patient had an episode where she suddenly slumped over leaning towards her left side and had left-sided facial droop.  Thinks her speech may have been slightly.  She was alert and answering questions appropriately, did not have any weakness in her arms or legs.  Symptoms resolved after 15 minutes.  Patient states that she just felt generally weak.  Denies any specific weakness.  She denies any acute complaints at present.  As a secondary complaint also states that over the weekend that she injured the right-this is her only injury and denied hitting her head.  Completed chart review has history of A. fib, CVA on Eliquis  HPI     Past Medical History:  Diagnosis Date  . Atrial fibrillation ([place])    Prior treatment with amiodarone, held sinus rhythm and drug stopped  . Atrial flutter ([place])    Status post ablation prior to  2009  . Chest pain    Nuclear, 2004, no scar or ischemia, EF 77%  . Ejection fraction    EF 70%, nuclear,  2004  //     . Fall    July, 2014  . H/O bladder repair surgery   . Right Achilles tendinitis   . Sciatica     Patient Active Problem List   Diagnosis Date Noted  . Pseudoaneurysm of femoral artery following procedure ([place]) 11/23/2019  . CAP (community acquired pneumonia) 11/23/2019  . Essential hypertension 11/23/2019  . Hyperlipidemia LDL goal <70 11/23/2019  . Emphysema of lung ([place]) 11/23/2019  . Aortic atherosclerosis ([place]) 11/23/2019  . Acute blood loss anemia 11/23/2019  . Acute ischemic  stroke ([place])   . Pseudoaneurysm ([place])   . Slow transit constipation   . Chronic UTI   . Acute ischemic right PCA stroke ([place]) s/p tPA and mechanical thrombectomy, d/t AF not on [facility] 11/14/2019  . Stroke ([place]) 11/13/2019  . Hymenoptera allergy 10/05/2014  . SVT (supraventricular tachycardia) ([place]) 10/20/2012  . Fall   . Chest pain   . Ejection fraction   . Atrial fibrillation ([place])   . Atrial flutter [facility])     Past Surgical History:  Procedure Laterality Date  . IR CT HEAD LTD  11/14/2019  . IR PERCUTANEOUS ART THROMBECTOMY/INFUSION INTRACRANIAL INC DIAG ANGIO  11/14/2019  . IR RADIOLOGIST EVAL & MGMT  02/20/2020  . IR US GUIDE VASC ACCESS RIGHT  11/14/2019  . no surgical hx    . RADIOLOGY WITH ANESTHESIA N/A 11/13/2019   Procedure: IR WITH ANESTHESIA;  Surgeon: Luanne Bras, MD;  Location: [facility] [place];  Service: Radiology;  Laterality: N/A;     OB History   No obstetric history on file.     Family History  Problem Relation Age of Onset  . Cancer Mother   . Cancer Father        pancreatic cancer  . Heart disease Brother     Social History   Tobacco Use  . Smoking status: Former Smoker    Packs/day: 1.00  Years: 10.00    Pack years: 10.00    Types: Cigarettes  . Smokeless tobacco: Never Used  Vaping Use  . Vaping Use: Never used  Substance Use Topics  . Alcohol use: Yes  . Drug use: Never    Home Medications Prior to Admission medications   Medication Sig Start Date End Date Taking? Authorizing Provider  amLODipine (NORVASC) 2.5 MG tablet Take 1 tablet (2.5 mg total) by mouth daily. 01/25/20 02/24/20  Kayleen Memos, DO  atorvastatin (LIPITOR) 10 MG tablet Take 10 mg by mouth at bedtime.    [provider]  cephALEXin (KEFLEX) 250 MG capsule Take 250 mg by mouth daily. 01/31/20   [provider]  Cholecalciferol (VITAMIN D3) 50 MCG (2000 UT) CAPS Take 2,000 Units by mouth at bedtime.    [provider]  Cyanocobalamin (VITAMIN B12 PO)  Take 1 tablet by mouth at bedtime.    [provider]  ELIQUIS 5 MG TABS tablet TAKE 1 TABLET BY MOUTH TWICE DAILY. 01/07/20   Jerline Pain, MD  mirtazapine (REMERON) 15 MG tablet Take 15 mg by mouth at bedtime.    [provider]  Vibegron (GEMTESA PO) Take by mouth.    [provider]    Allergies    Bee venom, Mixed vespid venom, Codeine, Sulfa antibiotics, and Erythromycin  Review of Systems   Review of Systems  Constitutional: Negative for chills and fever.  HENT: Negative for ear pain and sore throat.   Eyes: Negative for pain and visual disturbance.  Respiratory: Negative for cough and shortness of breath.   Cardiovascular: Negative for chest pain and palpitations.  Gastrointestinal: Negative for abdominal pain and vomiting.  Genitourinary: Negative for dysuria and hematuria.  Musculoskeletal: Positive for arthralgias. Negative for back pain.  Skin: Negative for color change and rash.  Neurological: Positive for weakness. Negative for seizures and syncope.  All other systems reviewed and are negative.   Physical Exam Updated Vital Signs BP (!) 151/70 (BP Location: Left Arm)   Pulse 67   Temp 98.3 F (36.8 C) (Oral)   Resp 16   Ht 5' (1.524 m)   Wt 63.5 kg   LMP  (LMP Unknown)   SpO2 100%   BMI 27.34 kg/m   Physical Exam Vitals and nursing note reviewed.  Constitutional:      General: She is not in acute distress.    Appearance: She is well-developed.  HENT:     Head: Normocephalic and atraumatic.  Eyes:     Conjunctiva/sclera: Conjunctivae normal.  Cardiovascular:     Rate and Rhythm: Normal rate and regular rhythm.     Heart sounds: No murmur heard.   Pulmonary:     Effort: Pulmonary effort is normal. No respiratory distress.     Breath sounds: Normal breath sounds.  Abdominal:     Palpations: Abdomen is soft.     Tenderness: There is no abdominal tenderness.  Musculoskeletal:     Cervical back: Neck supple.      Comments: Right lower extremity: Midfoot has some tenderness to palpation and mild swelling and ecchymosis, normal DP/PT pulses, sensation intact, distal motor intact  Skin:    General: Skin is warm and dry.  Neurological:     Mental Status: She is alert.     ED Results / Procedures / Treatments   Labs (all labs ordered are listed, but only abnormal results are displayed) Labs Reviewed  RAPID URINE DRUG SCREEN, HOSP PERFORMED  CBC  COMPREHENSIVE METABOLIC PANEL  HEMOGLOBIN A1C  PROTIME-INR  URINALYSIS, COMPLETE (UACMP) WITH MICROSCOPIC    EKG EKG Interpretation  Date/Time:  Monday Jun 09 2020 14:03:36 EDT Ventricular Rate:  72 PR Interval:    QRS Duration: 82 QT Interval:  422 QTC Calculation: 462 R Axis:   91 Text Interpretation: Atrial fibrillation Right axis deviation Confirmed by Madalyn Rob [phone]) on 06/09/2020 2:18:14 PM   Radiology DG Chest 2 View  Result Date: 06/09/2020 CLINICAL DATA:  Right foot pain injury EXAM: CHEST - 2 VIEW COMPARISON:  03/25/2020, CT 01/21/2020 FINDINGS: Emphysema and chronic fibrosis/lung disease. No acute consolidation or sizable effusion. Stable cardiomediastinal silhouette with aortic atherosclerosis. No pneumothorax IMPRESSION: No active cardiopulmonary disease.  Chronic lung disease Electronically Signed   By: Donavan Foil M.D.   On: 06/09/2020 15:52   CT HEAD WO CONTRAST  Result Date: 06/09/2020 CLINICAL DATA:  Acute neuro deficit.  Weakness. EXAM: CT HEAD WITHOUT CONTRAST TECHNIQUE: Contiguous axial images were obtained from the base of the skull through the vertex without intravenous contrast. COMPARISON:  CT head 12/20/2019 FINDINGS: Brain: Generalized atrophy and ventricular enlargement, stable from the prior study. Mild patchy white matter hypodensity bilaterally. Chronic lacunar infarction right thalamus unchanged. Negative for acute infarct, hemorrhage, mass Vascular: Negative for hyperdense vessel Skull: Negative  Sinuses/Orbits: Mild mucosal edema paranasal sinuses. Bilateral cataract extraction. Other: None IMPRESSION: No acute abnormality Atrophy and chronic microvascular ischemic changes are stable from the prior CT. Electronically Signed   By: Franchot Gallo M.D.   On: 06/09/2020 15:04   DG Foot Complete Right  Result Date: 06/09/2020 CLINICAL DATA:  Foot pain fall EXAM: RIGHT FOOT COMPLETE - 3+ VIEW COMPARISON:  None. FINDINGS: Acute appearing nondisplaced fracture involving mid to distal shaft of fifth metatarsal. No subluxation. Degenerative changes at the first MTP joint. IMPRESSION: Acute nondisplaced fracture involving the mid to distal shaft of the fifth metatarsal Electronically Signed   By: Donavan Foil M.D.   On: 06/09/2020 15:50    Procedures Procedures   Medications Ordered in ED Medications - No data to display  ED Course  I have reviewed the triage vital signs and the nursing notes.  Pertinent labs & imaging results that were available during my care of the patient were reviewed by me and considered in my medical decision making (see chart for details).    MDM Rules/Calculators/A&P                          87 year old lady presenting to ER with concern for episode of left-sided facial droop, generalized weakness.  On exam in ER, patient appears well, she denies any symptoms and has no acute neurologic deficits on my exam.  Based on further history obtained from son, concern for possible TIA.  CT head negative.  Reviewed case with neurology, Dr. Rory Percy, who recommended obtaining MRI. If negative, then he recommends discharge and outpatient management and continuing her anticoagulation as previously prescribed.  She also endorsed injuring her right foot recently.  Plain films demonstrating mid to distal shaft of the fifth metatarsal.  Will place postop shoe and recommend outpatient follow-up.  While awaiting MRI, labs, signed out to Dr. Francia Greaves.  Final Clinical Impression(s) / ED  Diagnoses Final diagnoses:  Transient neurologic deficit  Closed nondisplaced fracture of fifth metatarsal bone of right foot, initial encounter    Rx / DC Orders ED Discharge Orders    None       Earleen Aoun,  Ellwood Dense, MD 06/09/20 1630

## 2020-06-09 NOTE — ED Notes (Signed)
Patient taken to the bathroom via wheelchair for a BM.

## 2020-06-09 NOTE — ED Provider Notes (Signed)
Seen after prior ED PE.  Patient's MRI is without acute change. Patient remains asymptomatic at time of discharge.  She desires discharge.  She declines additional observation or overnight stay.  Of note, patient is on standing daily Keflex for treatment of colonized urine.  Will obtain culture of urine.  Patient is not symptomatic for UTI.  Importance of close follow-up is repeatedly stressed.  Patient does understand need for close follow-up both with neurology and also with Ortho.    Valarie Merino, MD 06/09/20 2027

## 2020-06-09 NOTE — ED Notes (Signed)
purewick placed to obtain uds and ua, pt uses pads for voiding due to incontinence

## 2020-06-09 NOTE — Discharge Instructions (Signed)
Bear weight as tolerated on your broken foot.  Use the postop shoe as instructed.  Follow-up with orthopedics.  Follow-up with your neurologist and your primary care doctor.  Return if you have any additional episodes.

## 2020-06-09 NOTE — ED Notes (Signed)
Post op shoe placed on left foot, nml c/s, states it only  Hurts when she walks on it.  nml movement

## 2020-06-09 NOTE — ED Triage Notes (Signed)
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

## 2020-06-10 LAB — CBG MONITORING, ED: Glucose-Capillary: 107 mg/dL — ABNORMAL HIGH (ref 70–99)

## 2020-06-24 ENCOUNTER — Ambulatory Visit: Payer: Self-pay

## 2020-07-02 ENCOUNTER — Ambulatory Visit (INDEPENDENT_AMBULATORY_CARE_PROVIDER_SITE_OTHER): Payer: Medicare Other | Admitting: Allergy

## 2020-07-02 ENCOUNTER — Other Ambulatory Visit: Payer: Self-pay

## 2020-07-02 DIAGNOSIS — T63441D Toxic effect of venom of bees, accidental (unintentional), subsequent encounter: Secondary | ICD-10-CM

## 2020-07-07 ENCOUNTER — Ambulatory Visit (INDEPENDENT_AMBULATORY_CARE_PROVIDER_SITE_OTHER): Payer: Medicare Other | Admitting: Neurology

## 2020-07-07 ENCOUNTER — Encounter: Payer: Self-pay | Admitting: Neurology

## 2020-07-07 ENCOUNTER — Telehealth: Payer: Self-pay

## 2020-07-07 VITALS — BP 122/62 | HR 63 | Ht 60.0 in | Wt 143.0 lb

## 2020-07-07 DIAGNOSIS — R0683 Snoring: Secondary | ICD-10-CM

## 2020-07-07 DIAGNOSIS — R351 Nocturia: Secondary | ICD-10-CM

## 2020-07-07 DIAGNOSIS — E663 Overweight: Secondary | ICD-10-CM | POA: Diagnosis not present

## 2020-07-07 DIAGNOSIS — G4719 Other hypersomnia: Secondary | ICD-10-CM | POA: Diagnosis not present

## 2020-07-07 DIAGNOSIS — I63431 Cerebral infarction due to embolism of right posterior cerebral artery: Secondary | ICD-10-CM

## 2020-07-07 DIAGNOSIS — Z8673 Personal history of transient ischemic attack (TIA), and cerebral infarction without residual deficits: Secondary | ICD-10-CM

## 2020-07-07 NOTE — Patient Instructions (Signed)
  Based on your symptoms and your exam I believe you are at risk for obstructive sleep apnea (aka OSA), and I think we should proceed with a sleep study to determine whether you do or do not have OSA and how severe it is. Even, if you have mild OSA, I may want you to consider treatment with CPAP, as treatment of even borderline or mild sleep apnea can result and improvement of symptoms such as sleep disruption, daytime sleepiness, nighttime bathroom breaks, restless leg symptoms, improvement of headache syndromes, even improved mood disorder.   As explained, an attended sleep study meaning you get to stay overnight in the sleep lab, lets us monitor sleep-related behaviors such as sleep talking and leg movements in sleep, in addition to monitoring for sleep apnea.  A home sleep test is a screening tool for sleep apnea only, and unfortunately does not help with any other sleep-related diagnoses.  Please remember, the long-term risks and ramifications of untreated moderate to severe obstructive sleep apnea are: increased Cardiovascular disease, including congestive heart failure, stroke, difficult to control hypertension, treatment resistant obesity, arrhythmias, especially irregular heartbeat commonly known as A. Fib. (atrial fibrillation); even type 2 diabetes has been linked to untreated OSA.   Sleep apnea can cause disruption of sleep and sleep deprivation in most cases, which, in turn, can cause recurrent headaches, problems with memory, mood, concentration, focus, and vigilance. Most people with untreated sleep apnea report excessive daytime sleepiness, which can affect their ability to drive. Please do not drive if you feel sleepy. Patients with sleep apnea can also develop difficulty initiating and maintaining sleep (aka insomnia).   Having sleep apnea may increase your risk for other sleep disorders, including involuntary behaviors sleep such as sleep terrors, sleep talking, sleepwalking.    Having  sleep apnea can also increase your risk for restless leg syndrome and leg movements at night.   Please note that untreated obstructive sleep apnea may carry additional perioperative morbidity. Patients with significant obstructive sleep apnea (typically, in the moderate to severe degree) should receive, if possible, perioperative PAP (positive airway pressure) therapy and the surgeons and particularly the anesthesiologists should be informed of the diagnosis and the severity of the sleep disordered breathing.   I will likely see you back after your sleep study to go over the test results and where to go from there. We will call you after your sleep study to advise about the results (most likely, you will hear from Megan, my nurse) and to set up an appointment at the time, as necessary.    Our sleep lab administrative assistant will call you to schedule your sleep study and give you further instructions, regarding the check in process for the sleep study, arrival time, what to bring, when you can expect to leave after the study, etc., and to answer any other logistical questions you may have. If you don't hear back from her by about 2 weeks from now, please feel free to call her direct line at [phone] or you can call our general clinic number, or email us through My Chart.    

## 2020-07-07 NOTE — Progress Notes (Signed)
Subjective:    Patient ID: [name] is a 87 y.o. female.  HPI    [name], MD, PhD [facility] [address], [address] [place], [place] [address]  Dear [name] and [name],   I saw your patient, [name], upon your kind request in my sleep clinic today for initial consultation of her sleep disorder, in particular, concern for underlying obstructive sleep apnea.  The patient is accompanied by her daughter, [name], today.  As you know, Ms. Awtrey is an 87 year old right-handed woman with an underlying medical history of paroxysmal atrial fibrillation, SVT, right PCA stroke with status post tPA and mechanical thrombectomy, sciatica, and overweight state, who reports excessive daytime somnolence since her stroke.  She has been sleeping long hours at night and also takes longer naps.  She has a tendency to nap in the afternoons but typically would nap for 30 or 45 minutes but since her stroke she can sleep for up to 2 hours per daughter.  She has a history of snoring, it is considered soft.  No witnessed apneas or gasping sensations at night.  She has nocturia about once per average night and denies recurrent morning headaches or family history of sleep apnea.  She lives alone but her children have taken turns to stay with her since she lost her husband recently and also since her recent stroke.  Since her stroke she had appetite loss and weight loss and was started on Remeron per PCP.  She is currently on 15 mg at bedtime.  I reviewed your office note from 05/13/2020.  Her Epworth sleepiness score is 7 out of 24, fatigue severity score is 1 out of 63.  Bedtime is generally between 10 and 11 PM and rise time between 1030 and 11 AM.  She drinks caffeine in the form of flavored water with an energy supplement, 1 serving per day, she drinks alcohol daily in the form of wine, 4 ounce glass, 2/day on average.  She has not been advised to cut back.  She is a  non-smoker, quit smoking about 50 years ago.   Her Past Medical History Is Significant For: Past Medical History:  Diagnosis Date   Atrial fibrillation ([place])    Prior treatment with amiodarone, held sinus rhythm and drug stopped   Atrial flutter ([place])    Status post ablation prior to  2009   Chest pain    Nuclear, 2004, no scar or ischemia, EF 77%   Ejection fraction    EF 70%, nuclear,  2004  //      Fall    July, 2014   H/O bladder repair surgery    Right Achilles tendinitis    Sciatica     Her Past Surgical History Is Significant For: Past Surgical History:  Procedure Laterality Date   IR CT HEAD LTD  11/14/2019   IR PERCUTANEOUS ART THROMBECTOMY/INFUSION INTRACRANIAL INC DIAG ANGIO  11/14/2019   IR RADIOLOGIST EVAL & MGMT  02/20/2020   IR US GUIDE VASC ACCESS RIGHT  11/14/2019   no surgical hx     RADIOLOGY WITH ANESTHESIA N/A 11/13/2019   Procedure: IR WITH ANESTHESIA;  Surgeon: Luanne Bras, MD;  Location: [facility];  Service: Radiology;  Laterality: N/A;    Her Family History Is Significant For: Family History  Problem Relation Age of Onset   Cancer Mother    Cancer Father        pancreatic cancer   Heart disease Brother  Her Social History Is Significant For: Social History   Socioeconomic History   Marital status: Married    Spouse name: Not on file   Number of children: Not on file   Years of education: Not on file   Highest education level: Not on file  Occupational History   Not on file  Tobacco Use   Smoking status: Former    Packs/day: 1.00    Years: 10.00    Pack years: 10.00    Types: Cigarettes   Smokeless tobacco: Never  Vaping Use   Vaping Use: Never used  Substance and Sexual Activity   Alcohol use: Yes   Drug use: Never   Sexual activity: Not on file  Other Topics Concern   Not on file  Social History Narrative   Not on file   Social Determinants of Health   Financial Resource Strain: Not on file  Food Insecurity: Not  on file  Transportation Needs: Not on file  Physical Activity: Not on file  Stress: Not on file  Social Connections: Not on file    Her Allergies Are:  Allergies  Allergen Reactions   Bee Venom Anaphylaxis   Mixed Vespid Venom Anaphylaxis   Codeine Other (See Comments)    Not known   Sulfa Antibiotics Itching and Nausea Only   Erythromycin Nausea And Vomiting  :   Her Current Medications Are:  Outpatient Encounter Medications as of 07/07/2020  Medication Sig   atorvastatin (LIPITOR) 10 MG tablet Take 10 mg by mouth at bedtime.   cephALEXin (KEFLEX) 250 MG capsule Take 250 mg by mouth daily.   Cholecalciferol (VITAMIN D3) 50 MCG (2000 UT) CAPS Take 2,000 Units by mouth at bedtime.   Cyanocobalamin (VITAMIN B12 PO) Take 1 tablet by mouth at bedtime.   ELIQUIS 5 MG TABS tablet TAKE 1 TABLET BY MOUTH TWICE DAILY.   mirtazapine (REMERON) 15 MG tablet Take 15 mg by mouth at bedtime.   Vibegron (GEMTESA PO) Take by mouth.   amLODipine (NORVASC) 2.5 MG tablet Take 1 tablet (2.5 mg total) by mouth daily.   No facility-administered encounter medications on file as of 07/07/2020.  :   Review of Systems:  Out of a complete 14 point review of systems, all are reviewed and negative with the exception of these symptoms as listed below:  Review of Systems  Neurological:        Here for sleep consult. No prior sleep study. Hx of stroke and sts mild snoring is noted.  Epworth Sleepiness Scale 0= would never doze 1= slight chance of dozing 2= moderate chance of dozing 3= high chance of dozing  Sitting and reading:1 Watching TV:1 Sitting inactive in a public place (ex. Theater or meeting):1 As a passenger in a car for an hour without a break:0 Lying down to rest in the afternoon:3 Sitting and talking to someone:0 Sitting quietly after lunch (no alcohol):1 In a car, while stopped in traffic:0 Total:7    Objective:  Neurological Exam  Physical Exam Physical Examination:    Vitals:   07/07/20 1058  BP: 122/62  Pulse: 63  SpO2: 97%    General Examination: The patient is a very pleasant 87 y.o. female in no acute distress. She appears well-developed and well-nourished and well groomed.   HEENT: Normocephalic, atraumatic, pupils are equal, round and reactive to light, extraocular tracking is good without limitation to gaze excursion or nystagmus noted. Hearing is grossly intact. Face is symmetric with normal facial animation. Speech is  clear with no dysarthria noted. There is no hypophonia. There is no lip, neck/head, jaw or voice tremor. Neck is supple with full range of passive and active motion. There are no carotid bruits on auscultation. Oropharynx exam reveals: mild mouth dryness, adequate dental hygiene and mild airway crowding, due to small airway, tonsils absent, Mallampati class II.  Neck circumference of 14-1/4 inches.  She has a minimal overbite.  Tongue protrudes centrally and palate elevates symmetrically.  Chest: Clear to auscultation without wheezing, rhonchi or crackles noted.  Heart: S1+S2+0, regular and normal without murmurs, rubs or gallops noted.   Abdomen: Soft, non-tender and non-distended with normal bowel sounds appreciated on auscultation.  Extremities: There is no pitting edema in the distal lower extremities bilaterally.   Skin: Warm and dry without trophic changes noted.   Musculoskeletal: exam reveals right foot in a boot, she injured her right forefoot with a metatarsal fracture sustained secondary to a fall recently.    Neurologically:  Mental status: The patient is awake, alert and oriented in all 4 spheres. Her immediate and remote memory, attention, language skills and fund of knowledge are appropriate. There is no evidence of aphasia, agnosia, apraxia or anomia. Speech is clear with normal prosody and enunciation. Thought process is linear. Mood is normal and affect is normal.  Cranial nerves II - XII are as described above  under HEENT exam.  Motor exam: Thin bulk, global strength equal, no tremor, no difficulty with fine motor skills.  Romberg is not tested due to safety concerns. Cerebellar testing: No dysmetria or intention tremor. There is no truncal or gait ataxia.  Sensory exam: intact to light touch in the upper and lower extremities.  Gait, station and balance: She does not have a walking aid.    Assessment and Plan:  In summary, WING GFELLER is a very pleasant 87 y.o.-year old female with an underlying medical history of paroxysmal atrial fibrillation, SVT, right PCA stroke with status post tPA and mechanical thrombectomy, sciatica, and overweight state, who presents for evaluation of her daytime somnolence.  She has had extended sleep at night and also longer naps during the day since her stroke.  She is advised that some of her sleepiness may stem from medication such as the mirtazapine which is currently 15 mg at bedtime.  She reports not having any depression, she does become tearful when we talk about the loss of her husband recently.  She has a good support system with all kids involved, she has 4 children who have taken turns to stay overnight with her.  History and physical exam does not rule out underlying obstructive sleep apnea.  I talked to the patient and her daughter about the diagnosis of sleep apnea, its prognosis and treatment options.  I explained in particular the risks and ramifications of untreated moderate to severe OSA, especially with respect to developing cardiovascular disease down the Road, including congestive heart failure, difficult to treat hypertension, cardiac arrhythmias, or stroke. Even type 2 diabetes has, in part, been linked to untreated OSA. Symptoms of untreated OSA include daytime sleepiness, memory problems, mood irritability and mood disorder such as depression and anxiety, lack of energy, as well as recurrent headaches, especially morning headaches. We talked about trying to  maintain a healthy lifestyle in general, as well as the importance of weight control.  She is advised to scale back on her daily alcohol consumption.  She is advised to talk to her primary care physician about this as well  as well as the possibility of reducing her Remeron at this time. I recommended the following at this time: sleep study.   I explained the sleep test procedure to the patient and also outlined the difference between a laboratory attended sleep study as well as a home sleep test.  We talked about surgical options for sleep apnea including inspire as well as a dental option and the positive airway pressure treatment option.  She would be willing to try CPAP or AutoPap therapy if the need arises.  We will plan a follow-up after testing, we will keep her posted as to her test results by phone call as well.  I answered all their questions today and the patient and her daughter were in agreement.    Thank you very much for allowing me to participate in the care of this nice patient. If I can be of any further assistance to you please do not hesitate to talk to me.  Sincerely,   [name], MD, PhD

## 2020-07-07 NOTE — Telephone Encounter (Signed)
LVM for pt to call me back to schedule sleep study  

## 2020-07-09 DIAGNOSIS — S92354A Nondisplaced fracture of fifth metatarsal bone, right foot, initial encounter for closed fracture: Secondary | ICD-10-CM | POA: Diagnosis not present

## 2020-07-15 ENCOUNTER — Other Ambulatory Visit: Payer: Self-pay | Admitting: Cardiology

## 2020-07-15 NOTE — Telephone Encounter (Signed)
Pt last saw Dr Marlou Porch 03/10/20, last labs 06/09/20 Creat 0.99, age 87, weight 64.9kg, based on specified criteria pt is on appropriate dosage of Eliquis 5mg  BID.  Will refill rx.

## 2020-07-30 ENCOUNTER — Other Ambulatory Visit: Payer: Self-pay

## 2020-07-30 ENCOUNTER — Ambulatory Visit (INDEPENDENT_AMBULATORY_CARE_PROVIDER_SITE_OTHER): Payer: Medicare Other | Admitting: Neurology

## 2020-07-30 DIAGNOSIS — E663 Overweight: Secondary | ICD-10-CM

## 2020-07-30 DIAGNOSIS — R0683 Snoring: Secondary | ICD-10-CM

## 2020-07-30 DIAGNOSIS — R9431 Abnormal electrocardiogram [ECG] [EKG]: Secondary | ICD-10-CM

## 2020-07-30 DIAGNOSIS — Z8673 Personal history of transient ischemic attack (TIA), and cerebral infarction without residual deficits: Secondary | ICD-10-CM

## 2020-07-30 DIAGNOSIS — R351 Nocturia: Secondary | ICD-10-CM

## 2020-07-30 DIAGNOSIS — G4719 Other hypersomnia: Secondary | ICD-10-CM

## 2020-07-30 DIAGNOSIS — G4720 Circadian rhythm sleep disorder, unspecified type: Secondary | ICD-10-CM

## 2020-07-31 NOTE — Procedures (Signed)
PATIENT'S NAME:  Melissa, Lynch DOB:      1931/09/29      MR#:    [id number]     DATE OF RECORDING: 07/30/2020 REFERRING M.D.:  Antony Contras, MD Study Performed:   Baseline Polysomnogram HISTORY: 87 year old woman with a history of paroxysmal atrial fibrillation, SVT, right PCA stroke with status post tPA and mechanical thrombectomy, sciatica, and overweight state, who reports excessive daytime somnolence since her stroke. The patient endorsed the Epworth Sleepiness Scale at 7 points. The patient's weight 143 pounds with a height of 60 (inches), resulting in a BMI of 28.1 kg/m2. The patient's neck circumference measured 14 1/4 inches.  CURRENT MEDICATIONS: Lipitor, Keflex, Vitamin D3, Vitamin B12, Eliquis, Remeron, Gemtesa, Norvasc   PROCEDURE:  This is a multichannel digital polysomnogram utilizing the Somnostar 11.2 system.  Electrodes and sensors were applied and monitored per AASM Specifications.   EEG, EOG, Chin and Limb EMG, were sampled at 200 Hz.  ECG, Snore and Nasal Pressure, Thermal Airflow, Respiratory Effort, CPAP Flow and Pressure, Oximetry was sampled at 50 Hz. Digital video and audio were recorded.      BASELINE STUDY  Lights Out was at 21:00 and Lights On at 04:59.  Total recording time (TRT) was 479 minutes, with a total sleep time (TST) of only 14 minutes.  The sleep efficiency was 2.9 %.     SLEEP ARCHITECTURE: She achieved only stage N1 sleep briefly.   RESPIRATORY ANALYSIS: There were a total of 0 respiratory events. The total APNEA/HYPOPNEA INDEX (AHI) was 0/hour.  OXYGEN SATURATION & C02: The Wake baseline 02 saturation was 94%, with the lowest being 85%. Time spent below 89% saturation equaled 1 minutes.   PERIODIC LIMB MOVEMENTS: The patient had a total of 0 Periodic Limb Movements.  The Periodic Limb Movement (PLM) index was 0 and the PLM Arousal index was 0/hour.  Audio and video analysis did not show any abnormal or unusual movements, behaviors, phonations or  vocalizations. The patient took one bathroom break. The EKG was at times irregular, with evidence of atrial fibrillation and PVCs.   Post-study, the patient indicated that sleep was worse than usual.   IMPRESSION:  Dysfunctions associated with sleep stages or arousal from sleep Non-specific abnormal EKG  RECOMMENDATIONS:  This study was inconclusive due to severely reduced sleep time. She achieved only a few minutes of sleep. Underlying sleep disordered breathing cannot be excluded and recommendations for treatment of an underlying organic sleep disorder cannot be made, based on this test. The patient will be offered repeat evaluation with a home sleep test, if she desires. The study showed an abnormal EKG. Clinical correlation is recommended. The patient is followed closely by cardiology.   I certify that I have reviewed the entire raw data recording prior to the issuance of this report in accordance with the Standards of Accreditation of the American Academy of Sleep Medicine (AASM)  [name], MD, PhD Diplomat, American Board of Neurology and Sleep Medicine (Neurology and Sleep Medicine)

## 2020-08-04 ENCOUNTER — Other Ambulatory Visit: Payer: Self-pay | Admitting: *Deleted

## 2020-08-04 ENCOUNTER — Telehealth: Payer: Self-pay | Admitting: *Deleted

## 2020-08-04 DIAGNOSIS — G4719 Other hypersomnia: Secondary | ICD-10-CM

## 2020-08-04 DIAGNOSIS — R0683 Snoring: Secondary | ICD-10-CM

## 2020-08-04 NOTE — Telephone Encounter (Signed)
-----   Message from [name], MD sent at 07/31/2020  5:13 PM EDT ----- Patient referred by Janett Billow and Dr. Leonie Man, seen by me on 07/07/20, diagnostic PSG on 07/30/20.   Please call and notify the patient that the recent sleep study was very limited as she did not have enough sleep time. We can consider doing a home sleep test, if she would like. If she would like to wait and see Janett Billow in FU first as scheduled in October, she can discuss this with her too.   Thanks,  [name], MD, PhD [facility])

## 2020-08-04 NOTE — Telephone Encounter (Signed)
I called and spoke to daughter of pt. I relayed that SS was limited, if pt is willing could do a HST.  She stated would like to proceed. Order placed.  Daughter has also LM for sleep lab as well.

## 2020-08-05 NOTE — Telephone Encounter (Signed)
Noted, thank you

## 2020-08-07 DIAGNOSIS — N3020 Other chronic cystitis without hematuria: Secondary | ICD-10-CM | POA: Diagnosis not present

## 2020-08-07 DIAGNOSIS — N3941 Urge incontinence: Secondary | ICD-10-CM | POA: Diagnosis not present

## 2020-08-07 DIAGNOSIS — N3000 Acute cystitis without hematuria: Secondary | ICD-10-CM | POA: Diagnosis not present

## 2020-08-19 DIAGNOSIS — M65341 Trigger finger, right ring finger: Secondary | ICD-10-CM | POA: Diagnosis not present

## 2020-08-20 ENCOUNTER — Ambulatory Visit (INDEPENDENT_AMBULATORY_CARE_PROVIDER_SITE_OTHER): Payer: Medicare Other | Admitting: Neurology

## 2020-08-20 DIAGNOSIS — G4733 Obstructive sleep apnea (adult) (pediatric): Secondary | ICD-10-CM | POA: Diagnosis not present

## 2020-08-20 DIAGNOSIS — R0683 Snoring: Secondary | ICD-10-CM | POA: Diagnosis not present

## 2020-08-20 DIAGNOSIS — G4719 Other hypersomnia: Secondary | ICD-10-CM

## 2020-08-21 NOTE — Progress Notes (Signed)
See procedure note.

## 2020-08-24 NOTE — Addendum Note (Signed)
Addended by: [name] on: 08/24/2020 06:40 PM   Modules accepted: Orders

## 2020-08-24 NOTE — Procedures (Signed)
   [facility]  HOME SLEEP TEST (Watch PAT) REPORT  STUDY DATE: 08/20/2020  DOB: [birth year]  MRN: [id number]  ORDERING CLINICIAN: [name], MD, PhD   REFERRING CLINICIAN: Dr. Antony Contras  CLINICAL INFORMATION/HISTORY: 87 year old right-handed woman with an underlying medical history of paroxysmal atrial fibrillation, SVT, right PCA stroke with status post tPA and mechanical thrombectomy, sciatica, and overweight state, who reports excessive daytime somnolence since her stroke.  A recent attempted laboratory attended baseline sleep study on 07/30/2020 resulted in inconclusive findings due to lack of sleep.  Epworth sleepiness score: 7/24.  BMI: 28.1 kg/m  FINDINGS:   Sleep Summary:   Total Recording Time (hours, min): 9 hours, 37 minutes  Total Sleep Time (hours, min):  6 hours, 56 minutes   Percent REM (%):    8.8%   Respiratory Indices:   Calculated pAHI (per hour):  37.5/hour, AHI with 4% desaturation criteria: 21.4/h  REM pAHI:    55.4/hour       NREM pAHI: 35.7/hour  Oxygen Saturation Statistics:    Oxygen Saturation (%) Mean: 94%   Minimum oxygen saturation (%):                 82%   O2 Saturation Range (%): 82-99%    O2 Saturation (minutes) <=88%: 0.7 min  Pulse Rate Statistics:   Pulse Mean (bpm):    58/min    Pulse Range (58-89/min)   IMPRESSION: OSA (obstructive sleep apnea)   RECOMMENDATION:  This home sleep test demonstrates moderate to severe obstructive sleep apnea with a total AHI of 37.5/hour (21.4/h with 4% desaturation criteria) and O2 nadir of 82%.  Moderate to loud snoring was detected.  Treatment with positive airway pressure is recommended. The patient will be advised to proceed with an autoPAP titration/trial at home for now. A full night titration study may be considered to optimize treatment settings, if needed down the road. Please note that untreated obstructive sleep apnea may carry additional perioperative  morbidity. Patients with significant obstructive sleep apnea should receive perioperative PAP therapy and the surgeons and particularly the anesthesiologist should be informed of the diagnosis and the severity of the sleep disordered breathing. The patient should be cautioned not to drive, work at heights, or operate dangerous or heavy equipment when tired or sleepy. Review and reiteration of good sleep hygiene measures should be pursued with any patient. Other causes of the patient's symptoms, including circadian rhythm disturbances, an underlying mood disorder, medication effect and/or an underlying medical problem cannot be ruled out based on this test. Clinical correlation is recommended. The patient and her referring provider will be notified of the test results. The patient will be seen in follow up in sleep clinic at [facility].  I certify that I have reviewed the raw data recording prior to the issuance of this report in accordance with the standards of the American Academy of Sleep Medicine (AASM).  INTERPRETING PHYSICIAN:   [name], MD, PhD  Board Certified in Neurology and Sleep Medicine  [facility] [address] [place], [place] [address] [phone]

## 2020-08-27 ENCOUNTER — Ambulatory Visit: Payer: Self-pay

## 2020-08-27 ENCOUNTER — Encounter: Payer: Self-pay | Admitting: *Deleted

## 2020-08-27 ENCOUNTER — Telehealth: Payer: Self-pay | Admitting: *Deleted

## 2020-08-27 NOTE — Telephone Encounter (Signed)
-----   Message from [name], MD sent at 08/24/2020  6:40 PM EDT ----- Patient referred by Janett Billow, seen by me on 07/07/2020.  She had an attempted baseline sleep study in the sleep lab but did not sleep enough.  She then had a home sleep test on 08/20/2020 which showed enough data.   Please call and notify the patient that the recent home sleep test showed obstructive sleep apnea in the moderate to severe range. I recommend treatment in the form of autoPAP, which means, that we don't have to bring her in for a sleep study with CPAP, but will let her start using a so called autoPAP machine at home, which is a CPAP-like machine with self-adjusting pressures. We will send the order to a local DME company (of her choice, or as per insurance requirement). The DME representative will fit her with a mask, educate her on how to use the machine, how to put the mask on, etc. I have placed an order in the chart. Please send the order, talk to patient, send report to referring MD. We will need a FU in sleep clinic for 10 weeks post-PAP set up, please arrange that with me or one of our NPs. Also reinforce the need for compliance with treatment. Thanks,   [name], MD, PhD [facility])

## 2020-08-27 NOTE — Telephone Encounter (Signed)
I spoke with her daughter on DPR [employer]). The sleep study findings have been provided and she would like to proceed with auto-pap treatment. [name] verbalized understanding that she must contact our office to schedule a 10-week follow up once the machine has been delivered. She is aware this the appt must occur in this time frame to meet insurance requirements.  Orders sent to [facility].

## 2020-09-04 ENCOUNTER — Ambulatory Visit: Payer: Medicare Other

## 2020-09-11 ENCOUNTER — Ambulatory Visit (INDEPENDENT_AMBULATORY_CARE_PROVIDER_SITE_OTHER): Payer: Medicare Other | Admitting: *Deleted

## 2020-09-11 ENCOUNTER — Other Ambulatory Visit: Payer: Self-pay

## 2020-09-11 DIAGNOSIS — T63441D Toxic effect of venom of bees, accidental (unintentional), subsequent encounter: Secondary | ICD-10-CM | POA: Diagnosis not present

## 2020-09-15 DIAGNOSIS — E785 Hyperlipidemia, unspecified: Secondary | ICD-10-CM | POA: Diagnosis not present

## 2020-09-15 DIAGNOSIS — R7301 Impaired fasting glucose: Secondary | ICD-10-CM | POA: Diagnosis not present

## 2020-09-22 DIAGNOSIS — Z1331 Encounter for screening for depression: Secondary | ICD-10-CM | POA: Diagnosis not present

## 2020-09-22 DIAGNOSIS — N1831 Chronic kidney disease, stage 3a: Secondary | ICD-10-CM | POA: Diagnosis not present

## 2020-09-22 DIAGNOSIS — Z8673 Personal history of transient ischemic attack (TIA), and cerebral infarction without residual deficits: Secondary | ICD-10-CM | POA: Diagnosis not present

## 2020-09-22 DIAGNOSIS — N390 Urinary tract infection, site not specified: Secondary | ICD-10-CM | POA: Diagnosis not present

## 2020-09-22 DIAGNOSIS — R945 Abnormal results of liver function studies: Secondary | ICD-10-CM | POA: Diagnosis not present

## 2020-09-22 DIAGNOSIS — R82998 Other abnormal findings in urine: Secondary | ICD-10-CM | POA: Diagnosis not present

## 2020-09-22 DIAGNOSIS — J189 Pneumonia, unspecified organism: Secondary | ICD-10-CM | POA: Diagnosis not present

## 2020-09-22 DIAGNOSIS — D6869 Other thrombophilia: Secondary | ICD-10-CM | POA: Diagnosis not present

## 2020-09-22 DIAGNOSIS — D692 Other nonthrombocytopenic purpura: Secondary | ICD-10-CM | POA: Diagnosis not present

## 2020-09-22 DIAGNOSIS — Z23 Encounter for immunization: Secondary | ICD-10-CM | POA: Diagnosis not present

## 2020-09-22 DIAGNOSIS — Z1389 Encounter for screening for other disorder: Secondary | ICD-10-CM | POA: Diagnosis not present

## 2020-09-22 DIAGNOSIS — E785 Hyperlipidemia, unspecified: Secondary | ICD-10-CM | POA: Diagnosis not present

## 2020-09-22 DIAGNOSIS — R7301 Impaired fasting glucose: Secondary | ICD-10-CM | POA: Diagnosis not present

## 2020-09-22 DIAGNOSIS — Z0000 Encounter for general adult medical examination without abnormal findings: Secondary | ICD-10-CM | POA: Diagnosis not present

## 2020-09-22 DIAGNOSIS — G4733 Obstructive sleep apnea (adult) (pediatric): Secondary | ICD-10-CM | POA: Diagnosis not present

## 2020-10-23 DIAGNOSIS — Z780 Asymptomatic menopausal state: Secondary | ICD-10-CM | POA: Diagnosis not present

## 2020-10-23 DIAGNOSIS — J189 Pneumonia, unspecified organism: Secondary | ICD-10-CM | POA: Diagnosis not present

## 2020-11-03 ENCOUNTER — Other Ambulatory Visit: Payer: Self-pay

## 2020-11-03 ENCOUNTER — Ambulatory Visit (INDEPENDENT_AMBULATORY_CARE_PROVIDER_SITE_OTHER): Payer: Medicare Other

## 2020-11-03 DIAGNOSIS — T63441D Toxic effect of venom of bees, accidental (unintentional), subsequent encounter: Secondary | ICD-10-CM

## 2020-11-06 ENCOUNTER — Ambulatory Visit: Payer: Medicare Other

## 2020-11-13 ENCOUNTER — Ambulatory Visit: Payer: Medicare Other | Admitting: Adult Health

## 2020-11-19 DIAGNOSIS — Z23 Encounter for immunization: Secondary | ICD-10-CM | POA: Diagnosis not present

## 2020-11-21 ENCOUNTER — Telehealth: Payer: Self-pay

## 2020-11-21 NOTE — Telephone Encounter (Signed)
I called pt's daughter and she will bring CPAP to visit on 11/24/2020.

## 2020-11-24 ENCOUNTER — Ambulatory Visit (INDEPENDENT_AMBULATORY_CARE_PROVIDER_SITE_OTHER): Payer: Medicare Other | Admitting: Adult Health

## 2020-11-24 ENCOUNTER — Encounter: Payer: Self-pay | Admitting: Adult Health

## 2020-11-24 VITALS — BP 146/87 | HR 86 | Ht 60.0 in | Wt 145.0 lb

## 2020-11-24 DIAGNOSIS — Z9989 Dependence on other enabling machines and devices: Secondary | ICD-10-CM

## 2020-11-24 DIAGNOSIS — G4733 Obstructive sleep apnea (adult) (pediatric): Secondary | ICD-10-CM

## 2020-11-24 DIAGNOSIS — G4719 Other hypersomnia: Secondary | ICD-10-CM

## 2020-11-24 DIAGNOSIS — I63431 Cerebral infarction due to embolism of right posterior cerebral artery: Secondary | ICD-10-CM

## 2020-11-24 NOTE — Progress Notes (Signed)
Order for CPAP has been sent to [facility].

## 2020-11-24 NOTE — Patient Instructions (Signed)
Please continue to use your CPAP machine nightly - this can take a while to see full benefits in regards to your fatigue during the day  No other changes today - continue to follow with your PCP for aggressive stroke risk factor management      Followup in the future with me in 6 months or call earlier if needed       Thank you for coming to see [place] at [facility]. I hope we have been able to provide you high quality care today.  You may receive a patient satisfaction survey over the next few weeks. We would appreciate your feedback and comments so that we may continue to improve ourselves and the health of our patients.

## 2020-11-24 NOTE — Progress Notes (Addendum)
[facility] [address] [place]. [place] [address] [phone]       STROKE FOLLOW UP NOTE  Ms. JACKELYN ILLINGWORTH Date of Birth:  12-Jun-1931 Medical Record Number:  [id number]   Reason for Referral: stroke follow up  Chief Complaint  Patient presents with   Initial CPAP    Rm 3  dgtr- [name]  "CPAP not working well; I can't tell a difference"     SUBJECTIVE:   HPI:   Update 11/24/2020 JM: Returns for 2-month stroke follow-up and initial CPAP compliance visit accompanied by her daughter, [name].  Stroke: Stable from stroke standpoint without new stroke/TIA symptoms.  Compliant on Eliquis and atorvastatin without side effects.  Blood pressure today 146/87.  OSA on CPAP: Eval by Dr. [name] Alberts - dx'd with moderate to severe sleep apnea as evidenced on HST completed 08/20/2020.  AutoPap initiated 10/03/2020.   CPAP compliance report from 10/25/2020 -11/23/2020 shows 24 out of 30 usage days and 17 days greater than 4 hours for 57% compliance.  Average usage 6.3 hours.  Residual AHI 10.9 with pressure in the 95th percentile 8.3 and leaks in the 95 percentile 26.7.  Changed to a new mask (nasal pillows) approx 3 weeks ago per daughter as patient was having difficulty operating nasal mask by herself. Prior to mask change, the first month of therapy (9/10-10/9) shows 29 out of 30 usage days with 25 days greater than 4 hours for 83% compliance and residual AHI 3.9 with leaks in the 95th percentile 19.9 and pressure in the 95th percentile 7.7.  She tolerates current mask well and is able to manage it better independently but isn't happy about using it - reports she uses it to "make my family happy". She is concerned regarding continued daytime fatigue. Epworth Sleepiness Scale 4/24 (prior 7/24).  Fatigue severity scale 63/63 (prior 1/63 although this may have been entered in error).  Followed by [facility].   No further concerns at this time     History provided for  reference purposes only Update 05/13/2020 JM: Ms. Dinger returns for stroke follow-up after prior visit via video visit approximately 4 months ago.  She is accompanied by her daughter, [name].  Stable from stroke standpoint without new stroke/TIA symptoms.  She continues to struggle with daytime fatigue and gait impairment.  Previously working with therapies but currently on hold as she has been traveling but she continues to do exercises routinely at home.  Occasionally ambulates with a cane more as a "safety net" and denies any recent falls.  Compliant on Eliquis and atorvastatin without associated side effects.  Blood pressure today 129/96.  Since prior visit, hospital admission 12/25-12/31 due to severe sepsis secondary to community-acquired pneumonia and possible UTI and has been routinely followed by pulmonology since discharge.  No new concerns at this time.  Initial visit 01/09/2020 via V[facility]: Ms. Seide is being seen for hospital follow-up accompanied by her daughter, [name].  She was discharged home on 12/10/2019 from CIR after a 17-day stay. She does report increased fatigue since her stroke. Denies any weakness, speech/language impairment, or dysphagia.  Does have mild cognitive impairment.  Currently working with [facility] PT/OT/SLP. Denies new or worsening stroke/TIA symptoms. Remains on Eliquis 5 mg twice daily without bleeding or bruising.  Remains on atorvastatin 10 mg daily without myalgias.  Blood pressure monitored by [facility] therapies which has been stable. Dr. Joylene Draft (PCP) recently started her on Remeron 15mg  nightly to help with appetite stimulation.  Denies depression/anxiety.  No further concerns at this time.  Stroke admission 11/13/2019 Ms. BRANDALYN HARTING is a 87 y.o. female with history of Afib/flutter s/p ablation, off AC due to persistent sinus rhythm, and a recent diagnosis of pneumonia (she was not improving on the first round of antibiotics tried and therefore was broadened to Augmentin  on 11/12/2019), who presented on 11/13/2019 with acute onset right gaze preference and left-sided weakness.  Personally reviewed hospitalization pertinent progress notes, lab work and imaging with summary provided.  Initially evaluated by Dr. Erlinda Hong with stroke work-up revealing right thalamic, cerebellar and frontal small infarcts with right PCA occlusion s/p tPA and IR with TICI 3 reperfusion w/ postprocedure small SAH, embolic secondary to AF not on AC.  Initially placed on Eliquis but held after developing right groin pseudoaneurysm w/ hematoma post IR receiving thrombin injection 10/22 by Dr. Earleen Newport with repeat injection 10/27 with resolution.  Followed by [facility] Dr. Doren Custard without surgical repair indicated.  Eliquis restarted 10/28 in setting of recent stroke and known atrial fibrillation. Hx of HTN treated with Cleviprex stabilized with long-term BP goal normotensive range. Hx of HLD on atorvastatin 10 mg daily with LDL 26 therefore discontinued due to low LDL level.  Other stroke risk factors include advanced age, former tobacco use and EtOH use but no prior stroke history.  Other active problems include acute respiratory failure (resolved) and community-acquired pneumonia, and hx of emphysema, aortic atherosclerosis, acute blood loss anemia and hypokalemia.  Per therapy recommendations, discharge to [facility] on 11/23/2019 for ongoing therapy needs.  Stroke: R thalamic, cerebellar, and frontal small infarcts with right PCA occlusion s/p tPA and IR with TICI3 from AF not on AC Small SAH interpeduncular cistern post IR CT Head - Age indeterminate small vessel disease including in the right thalamus, but no acute cortically based infarct or acute intracranial hemorrhage identified.  CTA H&N - Positive for occlusion of the Right PCA origin. No other large vessel occlusion, and generally mild for age atherosclerosis in the head and neck.  Cerebral angio/ IR - Rt PCA occlusion with TICI 3 flow  CT head - Status post  Right PCA endovascular revascularization with trace subarachnoid blood/contrast in the interpeduncular cistern. Small 10 mm area of contrast staining versus petechial hemorrhage in the medial right thalamus.  MRI head - R thalamus infarct with associated mild hemorrhage as noted on recent CT. There is also mild subarachnoid hemorrhage in the interpeduncular cistern unchanged from CT. Additional small areas of acute infarct in the right cerebellum and right frontal cortex. Moderate atrophy  MRA head -  Right PCA patent post thrombectomy. There is a mild stenosis in the right PCA at the P1/ P2 junction.  2D Echo - EF 60-65%. No source of embolus. LA moderately dilated [employer] Virus 2 - negative LDL - 26 HgbA1c - 5.7 VTE prophylaxis - Heparin 5000 units sq tid  aspirin 81 mg daily prior to admission, resumee Eliquis 10/28 Therapy recommendations:  HH OT, HH PT ->  CIR  Disposition:  CIR       ROS:   14 system review of systems performed and negative with exception of fatigue  PMH:  Past Medical History:  Diagnosis Date   Atrial fibrillation ([place])    Prior treatment with amiodarone, held sinus rhythm and drug stopped   Atrial flutter ([place])    Status post ablation prior to  2009   Chest pain    Nuclear, 2004, no scar or ischemia, EF 77%   Ejection  fraction    EF 70%, nuclear,  2004  //      Fall    July, 2014   H/O bladder repair surgery    OSA (obstructive sleep apnea)    Right Achilles tendinitis    Sciatica     PSH:  Past Surgical History:  Procedure Laterality Date   IR CT HEAD LTD  11/14/2019   IR PERCUTANEOUS ART THROMBECTOMY/INFUSION INTRACRANIAL INC DIAG ANGIO  11/14/2019   IR RADIOLOGIST EVAL & MGMT  02/20/2020   IR US GUIDE VASC ACCESS RIGHT  11/14/2019   no surgical hx     RADIOLOGY WITH ANESTHESIA N/A 11/13/2019   Procedure: IR WITH ANESTHESIA;  Surgeon: Luanne Bras, MD;  Location: [facility];  Service: Radiology;  Laterality: N/A;    Social History:   Social History   Socioeconomic History   Marital status: Widowed    Spouse name: Not on file   Number of children: 4   Years of education: Not on file   Highest education level: Not on file  Occupational History   Not on file  Tobacco Use   Smoking status: Former    Packs/day: 1.00    Years: 10.00    Pack years: 10.00    Types: Cigarettes   Smokeless tobacco: Never  Vaping Use   Vaping Use: Never used  Substance and Sexual Activity   Alcohol use: Yes   Drug use: Never   Sexual activity: Not on file  Other Topics Concern   Not on file  Social History Narrative   11/24/20 husband passed, children stay with her at night   Social Determinants of Health   Financial Resource Strain: Not on file  Food Insecurity: Not on file  Transportation Needs: Not on file  Physical Activity: Not on file  Stress: Not on file  Social Connections: Not on file  Intimate Partner Violence: Not on file    Family History:  Family History  Problem Relation Age of Onset   Cancer Mother    Cancer Father        pancreatic cancer   Heart disease Brother     Medications:   Current Outpatient Medications on File Prior to Visit  Medication Sig Dispense Refill   amLODipine (NORVASC) 2.5 MG tablet Take 1 tablet (2.5 mg total) by mouth daily. 30 tablet 0   atorvastatin (LIPITOR) 10 MG tablet Take 10 mg by mouth at bedtime.     cephALEXin (KEFLEX) 250 MG capsule Take 250 mg by mouth daily.     Cholecalciferol (VITAMIN D3) 50 MCG (2000 UT) CAPS Take 2,000 Units by mouth at bedtime.     Cyanocobalamin (VITAMIN B12 PO) Take 1 tablet by mouth at bedtime.     ELIQUIS 5 MG TABS tablet TAKE 1 TABLET BY MOUTH TWICE DAILY. 60 tablet 5   mirtazapine (REMERON) 15 MG tablet Take 15 mg by mouth at bedtime.     Vibegron (GEMTESA PO) Take by mouth.     No current facility-administered medications on file prior to visit.    Allergies:   Allergies  Allergen Reactions   Bee Venom Anaphylaxis   Mixed  Vespid Venom Anaphylaxis   Codeine Other (See Comments)    Not known   Sulfa Antibiotics Itching and Nausea Only   Erythromycin Nausea And Vomiting      OBJECTIVE: Today's Vitals   11/24/20 0902  BP: (!) 146/87  Pulse: 86  Weight: 145 lb (65.8 kg)  Height: 5' (1.524 m)  Body mass index is 28.32 kg/m.  General: well developed, well nourished,  very pleasant elderly Caucasian female, seated, in no evident distress Head: head normocephalic and atraumatic.   Neck: supple with no carotid or supraclavicular bruits Cardiovascular: irregular rate and rhythm, no murmurs Musculoskeletal: no deformity Skin:  no rash/petichiae Vascular:  Normal pulses all extremities   Neurologic Exam Mental Status: Awake and fully alert.   Fluent speech and language.  Oriented to place and time. Recent and remote memory intact. Attention span, concentration and fund of knowledge appropriate during visit. Mood and affect appropriate.  Cranial Nerves: Pupils equal, briskly reactive to light. Extraocular movements full without nystagmus. Visual fields full to confrontation. Hearing intact. Facial sensation intact. Face, tongue, palate moves normally and symmetrically.  Motor: Normal bulk and tone. Normal strength in all tested extremity muscles Sensory.: intact to touch , pinprick , position and vibratory sensation.  Coordination: Rapid alternating movements normal in all extremities. Finger-to-nose and heel-to-shin performed accurately bilaterally. Gait and Station: Arises from chair without difficulty. Stance is normal. Gait demonstrates  broad-based short shuffled steps with mild unsteadiness especially when making turns and use of cane.  Tandem walk and heel toe not attempted due to safety concerns Reflexes: 1+ and symmetric. Toes downgoing.        ASSESSMENT: VONNETTA AKEY is a 87 y.o. year old female presented with acute onset right gaze preference and left-sided weakness on 11/13/2019 with stroke  work-up revealing right thalamic, cerebellar and frontal small with right PCA occlusion s/p tPA and IR with TICI 3 reperfusion, embolic secondary to known AF not on AC. S/p IR small SAH and right groin pseudoaneurysm with hematoma.  Vascular risk factors include AF s/p ablation (off AC d/t persistent sinus rhythm), HTN, HLD, advanced age, former tobacco use and EtOH use.      PLAN:  R embolic stroke:  Continue Eliquis (apixaban) daily  and atorvastatin 10 mg daily for secondary stroke prevention.  Discussed secondary stroke prevention measures and importance of close PCP follow up for aggressive stroke risk factor management  HTN: BP goal <130/90.  Stable with use of amlodipine per PCP HLD: LDL goal <70.  On atorvastatin 10 mg daily per PCP.  Routine labs completed by PCP per daughter Atrial fibrillation, persistent: On Eliquis for CHA2DS2-VASc score 6.  Routinely followed by cardiology OSA on CPAP: HST 08/20/2020 moderate to severe obstructive sleep apnea with total AHI of 37.5/h. AutoPAP initiated on 9/9. Discussed importance of increasing nightly compliance and ensure use greater than 4 hours. Increased AHI since mask change - will decrease pressure from 12 to 10 to see if this helps with leaks which may be contributing to increased AHI at 10.9. Daughter will bring machine back to office in 1 month for repeat download. If no improvement, will need to have mask refitting. Discussed continued fatigue - may take time to see improvement but need to use nightly and get AHI under better control. Continue to follow with DME [facility]    Follow up in 6 months or call earlier if needed   CC:  [facility] provider: Dr. Mont Dutton, MD    I spent 37 minutes of face-to-face and non-face-to-face time with patient and daughter.  This included previsit chart review, lab review, study review, order entry, electronic health record documentation, patient and daughter education regarding new dx of sleep  apnea, review and discussion of compliance report and CPAP related concerns, prior stroke including etiology, importance of managing stroke risk  factors and answered all other questions to patient and daughters satisfaction  Frann Rider, [facility]  [facility] [address] [place], [place] [address]  Phone [phone] Fax [phone] Note: This document was prepared with digital dictation and possible smart phrase technology. Any transcriptional errors that result from this process are unintentional.

## 2020-12-11 ENCOUNTER — Telehealth: Payer: Self-pay

## 2020-12-11 NOTE — Telephone Encounter (Signed)
I reviewed the compliance download for the past 30 days.  She is using her machine very consistently for an average of 6.8 hours.  Average AHI is around 17, mostly because of obstructive events.  Average pressure is around 8.4 cm.  Leak is in the acceptable range currently.  I would recommend we continue with the current pressure setting as planned and we can certainly review another download in 1 month.

## 2020-12-11 NOTE — Telephone Encounter (Signed)
I would like to review a 30-day download, please pull the data.

## 2020-12-11 NOTE — Telephone Encounter (Signed)
I called and relayed the message to [name] her daughter relating to the messages sent from [employer] about her breathing episodes. (3 days that were very high).   I relayed the message and information and recommendation of staying as is and recheck in 1 month a cpap DL.  She verbalized understanding.  I did relay that Dr. [name] Alberts did see message sent from [facility].

## 2020-12-11 NOTE — Telephone Encounter (Signed)
I sent message to [name] and Caryl Pina about getting cpap DL.  (Did not see on AV or PCO). To fax to me report.  I found on [employer].  To Dr. [name] Alberts.

## 2020-12-11 NOTE — Telephone Encounter (Deleted)
I reviewed compliance download for the past 74 days.  She used her machine 17 days and average AHI is 5 with mostly central events.  We can proceed as planned and see how she does.  I would like to review a compliance report in about a month, please advise patient or family member to call us to review another download in about a month.

## 2020-12-11 NOTE — Telephone Encounter (Signed)
I have received this email from rep with [employer]:  I just finished my appointment with [name] (DOB November 18, 1931), and I have some concerns that Dr. [name] Alberts should be made aware of.   I initially saw her on 10/03 for a maskswap appointment, where I switched her from an N20 to an N30i mask. She felt as though the comfort of the new mask would be much better for her and stated today that it has been working great and she has not noticed any significant leaks.   While she was here for her pressure change, I went ahead and did an SD card D/L for her, where I noticed that her AHI has greatly increased since October 23-24. Her daughter also mentioned that she has noticed the increase in her numbers recently as well. According to her machine and compliance report, it is detailing that her AHI is spiking up to the 70s, which is contradictory to her initial diagnosed AHI of 21.4. It also does not appear to be in correlation to her leaks or the new mask.   I asked for the assistance of Glenda to see what may be happening and she advised that the patient will most likely need to go in to see Dr. [name] Alberts as soon as possible.   Just to clarify, I did make the pressure change and decreased the max pressure to 10.   If there is anything that I could do from my end, please let me know. Thanks.   Jarrett Soho Respiratory Support Technician

## 2020-12-17 ENCOUNTER — Ambulatory Visit: Payer: Medicare Other | Admitting: Neurology

## 2020-12-23 ENCOUNTER — Ambulatory Visit: Payer: Medicare Other | Admitting: Adult Health

## 2020-12-29 ENCOUNTER — Ambulatory Visit (INDEPENDENT_AMBULATORY_CARE_PROVIDER_SITE_OTHER): Payer: Medicare Other

## 2020-12-29 ENCOUNTER — Other Ambulatory Visit: Payer: Self-pay

## 2020-12-29 DIAGNOSIS — T63441D Toxic effect of venom of bees, accidental (unintentional), subsequent encounter: Secondary | ICD-10-CM

## 2021-01-05 ENCOUNTER — Telehealth: Payer: Self-pay | Admitting: Adult Health

## 2021-01-05 ENCOUNTER — Encounter: Payer: Self-pay | Admitting: Adult Health

## 2021-01-05 DIAGNOSIS — G4733 Obstructive sleep apnea (adult) (pediatric): Secondary | ICD-10-CM

## 2021-01-05 DIAGNOSIS — G4719 Other hypersomnia: Secondary | ICD-10-CM

## 2021-01-05 DIAGNOSIS — I63431 Cerebral infarction due to embolism of right posterior cerebral artery: Secondary | ICD-10-CM

## 2021-01-05 DIAGNOSIS — Z9989 Dependence on other enabling machines and devices: Secondary | ICD-10-CM

## 2021-01-05 NOTE — Telephone Encounter (Signed)
Can daughter be contacted requesting she bring her mothers CPAP machine to office to repeat download? At prior visit, we discussed daughter bringing machine back in around this time but requested a reminder call.  Thank you.

## 2021-01-05 NOTE — Telephone Encounter (Signed)
I called pt's daughter back and we discussed. She verbalized understanding and will try and bring CPAP by this week during normal hours of operation so we can DL.

## 2021-01-06 NOTE — Telephone Encounter (Signed)
Report placed on jessica's desk

## 2021-01-07 ENCOUNTER — Other Ambulatory Visit: Payer: Self-pay | Admitting: Cardiology

## 2021-01-07 NOTE — Telephone Encounter (Signed)
Prescription refill request for Eliquis received. Indication: PAF Last office visit: 03/10/20  Derl Barrow MD Scr: 0.99 on 06/09/20 Age:  87 Weight: 63kg  Based on above findings Eliquis 5mg  twice daily is the appropriate dose.  Refill approved x 1.  Pt is past due for OV with Dr Marlou Porch.  Message sent to schedulers.

## 2021-01-07 NOTE — Telephone Encounter (Signed)
Hey!  Patient is scheduled for 02/25/21 with Richardson Dopp, can someone place the orders for lab work to be done the same day as appointment?

## 2021-01-08 ENCOUNTER — Other Ambulatory Visit: Payer: Self-pay | Admitting: *Deleted

## 2021-01-08 DIAGNOSIS — D692 Other nonthrombocytopenic purpura: Secondary | ICD-10-CM | POA: Diagnosis not present

## 2021-01-08 DIAGNOSIS — Z79899 Other long term (current) drug therapy: Secondary | ICD-10-CM

## 2021-01-08 DIAGNOSIS — D2239 Melanocytic nevi of other parts of face: Secondary | ICD-10-CM | POA: Diagnosis not present

## 2021-01-08 DIAGNOSIS — I480 Paroxysmal atrial fibrillation: Secondary | ICD-10-CM

## 2021-01-08 DIAGNOSIS — L570 Actinic keratosis: Secondary | ICD-10-CM | POA: Diagnosis not present

## 2021-01-08 DIAGNOSIS — Z85828 Personal history of other malignant neoplasm of skin: Secondary | ICD-10-CM | POA: Diagnosis not present

## 2021-01-08 NOTE — Progress Notes (Signed)
Patient is scheduled for 02/25/21 with Richardson Dopp, can someone place the orders for lab work to be done the same day as appointment?  CBC/BMP

## 2021-01-12 NOTE — Telephone Encounter (Signed)
At prior visit in 10/2020, c/o high pressure contributing to intolerance and leaks at 25 on APAP 6/12 with residual AHI at 10. Max pressure being used 10 therefore lowered rate to 10 to see if this helped with tolerance and leaks. Repeat CPAP download from 11/12-12/12 shows 25 out of 31 usage days with improvement of leaks at 17 although increased AHI at 15.7.  Will request pt return to sleep clinic for titration study. Megan, Can you please place order for this? Thank you!

## 2021-01-12 NOTE — Addendum Note (Signed)
Addended by: Verlin Grills on: 01/12/2021 04:56 PM   Modules accepted: Orders

## 2021-01-16 NOTE — Telephone Encounter (Signed)
Pt's daughter [name] called wanting to know the update on the pt's sim card. Please advise.

## 2021-01-20 NOTE — Telephone Encounter (Signed)
This question should probably go to Dr. [name] Alberts - I am not sure if there are any other options at this point. Possibly make further changes to current CPAP settings but this will need to be deferred to Dr. [name] Alberts who follows her for apnea. Thank you

## 2021-01-20 NOTE — Telephone Encounter (Signed)
I called the pt's daughter back and advised of this recommendation. She is agreeable to having Dr. [name] Alberts review once she returns to the office on 01/28/2020.

## 2021-01-20 NOTE — Telephone Encounter (Signed)
I called pt's daughter back. She wanted to discuss the sleep study recommendation. Pts daughter reports when she was seen by Dr. [name] Alberts a in lab study was first recommended but pt could not sleep in the sleep lab. Reports the pt was anxious and could not rest well. Pt ended up having HST completed due to this issue.  Daughter is worried this would happen again if in lab study was pursued  at this time. Wanted to know if any other recommendations could be made?

## 2021-01-27 NOTE — Telephone Encounter (Signed)
We can forego titration study and try to tweak the pressure.  I would like to increase to maximum pressure on her AutoPap machine back to 12, it is currently at 10 cm.  It is going to be difficult to find the right balance between tolerance and optimal apnea control.  We can also leave the settings as they are because her sleep apnea is improved albeit not optimally controlled.  Let me know if she would like to try a different pressure setting but we will likely have to increase the maximum pressure from 10 cm to something a little higher.  I agree that there is a chance that she may not be able to sleep well or in the office during a in lab titration study.  Please advise patient or her daughter of the above, I will put pressure change order in once you talk to her.

## 2021-01-28 ENCOUNTER — Telehealth: Payer: Self-pay | Admitting: Neurology

## 2021-01-28 NOTE — Telephone Encounter (Signed)
Called and spoke to pt's daughter and she stated that they have agreed not to do the sleep study.

## 2021-01-28 NOTE — Telephone Encounter (Signed)
Please send a DME order for a pressure increase to her DME company, AutoPap maximum pressure to be increased to 12 cm.

## 2021-01-28 NOTE — Addendum Note (Signed)
Addended by: Verlin Grills on: 01/28/2021 09:40 AM   Modules accepted: Orders

## 2021-01-28 NOTE — Telephone Encounter (Signed)
I called the pt's daughter back ( [place] ok per dpr) and we discussed message. She is agreeable to the pressure change. I advised we would check back in 4 weeks to see how she is doing.

## 2021-01-28 NOTE — Telephone Encounter (Signed)
Order has been sent with pressure change request to [facility].

## 2021-01-28 NOTE — Telephone Encounter (Signed)
Noted! Thank you

## 2021-02-23 ENCOUNTER — Ambulatory Visit: Payer: Medicare Other

## 2021-02-24 NOTE — Progress Notes (Signed)
Office Visit    Patient Name: [name] Date of Encounter: 02/25/2021  PCP:  Crist Infante, MD   [place]  Cardiologist:  Candee Furbish, MD  Advanced Practice Provider:  No care team member to display Electrophysiologist:  None   HPI    [name] is a 88 y.o. female with a hx of paroxysmal atrial fibrillation, history of chest pain, and prior stroke October 2021, presents today for routine follow-up appointment.  The patient underwent an atrial flutter ablation in 2007 by Dr. Lovena Le.  When she was last seen by Dr. Marlou Porch February 2022 she was quite weak.  She had recently been hospitalized that past December with sepsis.  She had Salmonella as well and pneumonia.  She was still grieving from her husband passing away.  Biggest complaint was fatigue.  Today, she states that she is tired all the time.  She usually sleeps for 10 hours at a time and is still tired afterwards.  She believes that she is getting quality sleep and sleeps with a CPAP for sleep apnea.  She states that it started after her stroke back in October 2021.  She did have an echocardiogram at that time which showed normal EF and no significant valvular disease.  She denies any chest pain or shortness of breath.  She has not had any dizziness, lightheadedness, syncope or presyncope.   Reports no palpitations.    Past Medical History    Past Medical History:  Diagnosis Date   Atrial fibrillation [facility])    Prior treatment with amiodarone, held sinus rhythm and drug stopped   Atrial flutter ([place])    Status post ablation prior to  2009   Chest pain    Nuclear, 2004, no scar or ischemia, EF 77%   Ejection fraction    EF 70%, nuclear,  2004  //      Fall    July, 2014   H/O bladder repair surgery    OSA (obstructive sleep apnea)    Right Achilles tendinitis    Sciatica    Past Surgical History:  Procedure Laterality Date   IR CT HEAD LTD  11/14/2019   IR PERCUTANEOUS ART  THROMBECTOMY/INFUSION INTRACRANIAL INC DIAG ANGIO  11/14/2019   IR RADIOLOGIST EVAL & MGMT  02/20/2020   IR US GUIDE VASC ACCESS RIGHT  11/14/2019   no surgical hx     RADIOLOGY WITH ANESTHESIA N/A 11/13/2019   Procedure: IR WITH ANESTHESIA;  Surgeon: Luanne Bras, MD;  Location: [place];  Service: Radiology;  Laterality: N/A;    Allergies  Allergies  Allergen Reactions   Bee Venom Anaphylaxis   Mixed Vespid Venom Anaphylaxis   Codeine Other (See Comments)    Not known   Sulfa Antibiotics Itching and Nausea Only   Erythromycin Nausea And Vomiting     EKGs/Labs/Other Studies Reviewed:   The following studies were reviewed today:  ECHO 11/14/19    1. Left ventricular ejection fraction, by estimation, is 60 to 65%. The  left ventricle has normal function. The left ventricle has no regional  wall motion abnormalities. There is severe asymmetric left ventricular  hypertrophy of the basal-septal  segment. Left ventricular diastolic function could not be evaluated.   2. Right ventricular systolic function is normal. The right ventricular  size is normal. There is mildly elevated pulmonary artery systolic  pressure.   3. Left atrial size was moderately dilated.   4. A small pericardial effusion is present.  The pericardial effusion is  circumferential.   5. The mitral valve is normal in structure. Trivial mitral valve  regurgitation. No evidence of mitral stenosis. Moderate mitral annular  calcification.   6. The aortic valve is tricuspid. There is mild calcification of the  aortic valve. There is mild thickening of the aortic valve. Aortic valve  regurgitation is trivial. Mild aortic valve sclerosis is present, with no  evidence of aortic valve stenosis.   7. The inferior vena cava is normal in size with greater than 50%  respiratory variability, suggesting right atrial pressure of 3 mmHg.   Conclusion(s)/Recommendation(s): No intracardiac source of embolism  detected on  this transthoracic study. A transesophageal echocardiogram is  recommended to exclude cardiac source of embolism if clinically indicated.  Basal septal hypertrophy of LV that  is severe, otherwise diffuse moderate LVH. No focal wall motion  abnormalities.   EKG:  EKG is not ordered today.   Recent Labs: 06/09/2020: ALT 28; BUN 13; Creatinine, Ser 0.99; Hemoglobin 14.5; Platelets 192; Potassium 4.1; Sodium 140  Recent Lipid Panel    Component Value Date/Time   CHOL 70 11/14/2019 0408   TRIG 126 11/14/2019 0408   TRIG 127 11/14/2019 0408   HDL 19 (L) 11/14/2019 0408   CHOLHDL 3.7 11/14/2019 0408   VLDL 25 11/14/2019 0408   LDLCALC 26 11/14/2019 0408    Risk Assessment/Calculations:   CHA2DS2-VASc Score = 6   This indicates a 9.7% annual risk of stroke. The patient's score is based upon: CHF History: 0 HTN History: 0 Diabetes History: 0 Stroke History: 2 Vascular Disease History: 1 Age Score: 2 Gender Score: 1     Home Medications   Current Meds  Medication Sig   amLODipine (NORVASC) 2.5 MG tablet Take 1 tablet (2.5 mg total) by mouth daily.   apixaban (ELIQUIS) 5 MG TABS tablet TAKE ONE TABLET BY MOUTH TWICE DAILY   atorvastatin (LIPITOR) 10 MG tablet Take 10 mg by mouth at bedtime.   cephALEXin (KEFLEX) 250 MG capsule Take 250 mg by mouth daily.   Cholecalciferol (VITAMIN D3) 50 MCG (2000 UT) CAPS Take 2,000 Units by mouth at bedtime.   Cyanocobalamin (VITAMIN B12 PO) Take 1 tablet by mouth at bedtime.   GEMTESA 75 MG TABS Take 1 tablet by mouth at bedtime.   mirtazapine (REMERON) 15 MG tablet Take 15 mg by mouth at bedtime.     Review of Systems      All other systems reviewed and are otherwise negative except as noted above.  Physical Exam    VS:  BP (!) 130/58 (BP Location: Right Arm)    Pulse 100    Ht 5' (1.524 m)    Wt 152 lb 3.2 oz (69 kg)    LMP  (LMP Unknown)    SpO2 98%    BMI 29.72 kg/m  , BMI Body mass index is 29.72 kg/m.  Wt Readings from Last  3 Encounters:  02/25/21 152 lb 3.2 oz (69 kg)  11/24/20 145 lb (65.8 kg)  07/07/20 143 lb (64.9 kg)     GEN: Well nourished, well developed, in no acute distress. HEENT: normal. Neck: Supple, no JVD, carotid bruits, or masses. Cardiac: Irregularly irregular, no murmurs, rubs, or gallops. No clubbing, cyanosis, edema.  Radials/PT 2+ and equal bilaterally.  Respiratory:  Respirations regular and unlabored, clear to auscultation bilaterally. GI: Soft, nontender, nondistended. MS: No deformity or atrophy. Skin: Warm and dry, no rash. Neuro:  Strength and sensation are intact.  Psych: Normal affect.  Assessment & Plan    Paroxysmal atrial fibrillation -Good rate control -On Eliquis for anticoagulation without any bleeding -Status post atrial flutter ablation by Dr. Lovena Le  Hyperlipidemia -Last lipid panel was October 2021: Total cholesterol 70, HDL 19, LDL 26, total triglycerides 126 -Update lipid panel today  Stroke -Continued weakness -Encourage daily exercise  4. Fatigue -She states that she is tired even after 10 hours of sleep -We will plan to order CBC, CMP, and lipid panel -We will order an echocardiogram to rule out any cardiac cause of fatigue -If echocardiogram is negative will refer back to neurology for further work-up   Disposition: Follow up 1 year with Candee Furbish, MD or APP.  Signed, Elgie Collard, PA-C 02/25/2021, 3:24 PM [facility]

## 2021-02-25 ENCOUNTER — Other Ambulatory Visit: Payer: Self-pay

## 2021-02-25 ENCOUNTER — Ambulatory Visit (INDEPENDENT_AMBULATORY_CARE_PROVIDER_SITE_OTHER): Payer: Medicare Other | Admitting: Physician Assistant

## 2021-02-25 ENCOUNTER — Encounter: Payer: Self-pay | Admitting: Physician Assistant

## 2021-02-25 VITALS — BP 130/58 | HR 100 | Ht 60.0 in | Wt 152.2 lb

## 2021-02-25 DIAGNOSIS — R5383 Other fatigue: Secondary | ICD-10-CM | POA: Diagnosis not present

## 2021-02-25 DIAGNOSIS — I3139 Other pericardial effusion (noninflammatory): Secondary | ICD-10-CM | POA: Diagnosis not present

## 2021-02-25 DIAGNOSIS — I480 Paroxysmal atrial fibrillation: Secondary | ICD-10-CM | POA: Diagnosis not present

## 2021-02-25 DIAGNOSIS — I639 Cerebral infarction, unspecified: Secondary | ICD-10-CM

## 2021-02-25 DIAGNOSIS — E782 Mixed hyperlipidemia: Secondary | ICD-10-CM

## 2021-02-25 MED ORDER — APIXABAN 5 MG PO TABS: 5.0000 mg | ORAL_TABLET | Freq: Two times a day (BID) | ORAL | 3 refills | Status: DC

## 2021-02-25 MED ORDER — AMLODIPINE BESYLATE 2.5 MG PO TABS: 2.5000 mg | ORAL_TABLET | Freq: Every day | ORAL | 3 refills | Status: DC

## 2021-02-25 MED ORDER — ATORVASTATIN CALCIUM 10 MG PO TABS: 10.0000 mg | ORAL_TABLET | Freq: Every day | ORAL | 3 refills | Status: DC

## 2021-02-25 NOTE — Patient Instructions (Signed)
Medication Instructions:   Your physician recommends that you continue on your current medications as directed. Please refer to the Current Medication list given to you today.  *If you need a refill on your cardiac medications before your next appointment, please call your pharmacy*   Lab Work:   TODAY!!!!!  CMET/LIPID/CBC   If you have labs (blood work) drawn today and your tests are completely normal, you will receive your results only by: [place] (if you have MyChart) OR A paper copy in the mail If you have any lab test that is abnormal or we need to change your treatment, we will call you to review the results.   Testing/Procedures:   Your physician has requested that you have an echocardiogram. Echocardiography is a painless test that uses sound waves to create images of your heart. It provides your doctor with information about the size and shape of your heart and how well your hearts chambers and valves are working. This procedure takes approximately one hour. There are no restrictions for this procedure.    Follow-Up: At [facility], you and your health needs are our priority.  As part of our continuing mission to provide you with exceptional heart care, we have created designated Provider Care Teams.  These Care Teams include your primary Cardiologist (physician) and Advanced Practice Providers (APPs -  Physician Assistants and Nurse Practitioners) who all work together to provide you with the care you need, when you need it.  We recommend signing up for the patient portal called "MyChart".  Sign up information is provided on this After Visit Summary.  MyChart is used to connect with patients for Virtual Visits (Telemedicine).  Patients are able to view lab/test results, encounter notes, upcoming appointments, etc.  Non-urgent messages can be sent to your provider as well.   To learn more about what you can do with MyChart, go to [email or web address].    Your  next appointment:   1 year(s)  The format for your next appointment:   In Person  Provider:   Candee Furbish, MD     Other Instructions  Your physician wants you to follow-up in: 1 year with Dr. Marlou Porch.  You will receive a reminder letter in the mail two months in advance. If you don't receive a letter, please call our office to schedule the follow-up appointment.

## 2021-02-26 ENCOUNTER — Ambulatory Visit (INDEPENDENT_AMBULATORY_CARE_PROVIDER_SITE_OTHER): Payer: Medicare Other

## 2021-02-26 DIAGNOSIS — T63441D Toxic effect of venom of bees, accidental (unintentional), subsequent encounter: Secondary | ICD-10-CM | POA: Diagnosis not present

## 2021-02-26 LAB — COMPREHENSIVE METABOLIC PANEL
ALT: 23 IU/L (ref 0–32)
AST: 26 IU/L (ref 0–40)
Albumin/Globulin Ratio: 2.3 — ABNORMAL HIGH (ref 1.2–2.2)
Albumin: 4.4 g/dL (ref 3.6–4.6)
Alkaline Phosphatase: 69 IU/L (ref 44–121)
BUN/Creatinine Ratio: 18 (ref 12–28)
BUN: 15 mg/dL (ref 8–27)
Bilirubin Total: 0.6 mg/dL (ref 0.0–1.2)
CO2: 27 mmol/L (ref 20–29)
Calcium: 9.4 mg/dL (ref 8.7–10.3)
Chloride: 105 mmol/L (ref 96–106)
Creatinine, Ser: 0.83 mg/dL (ref 0.57–1.00)
Globulin, Total: 1.9 g/dL (ref 1.5–4.5)
Glucose: 96 mg/dL (ref 70–99)
Potassium: 4.8 mmol/L (ref 3.5–5.2)
Sodium: 145 mmol/L — ABNORMAL HIGH (ref 134–144)
Total Protein: 6.3 g/dL (ref 6.0–8.5)
eGFR: 68 mL/min/{1.73_m2} (ref >59)

## 2021-02-26 LAB — CBC
Hematocrit: 37.2 % (ref 34.0–46.6)
Hemoglobin: 13 g/dL (ref 11.1–15.9)
MCH: 32.5 pg (ref 26.6–33.0)
MCHC: 34.9 g/dL (ref 31.5–35.7)
MCV: 93 fL (ref 79–97)
Platelets: 197 10*3/uL (ref 150–450)
RBC: 4 x10E6/uL (ref 3.77–5.28)
RDW: 12.4 % (ref 11.7–15.4)
WBC: 5.2 10*3/uL (ref 3.4–10.8)

## 2021-02-26 LAB — LIPID PANEL
Chol/HDL Ratio: 2.7 ratio (ref 0.0–4.4)
Cholesterol, Total: 131 mg/dL (ref 100–199)
HDL: 48 mg/dL (ref >39)
LDL Chol Calc (NIH): 56 mg/dL (ref 0–99)
Triglycerides: 161 mg/dL — ABNORMAL HIGH (ref 0–149)
VLDL Cholesterol Cal: 27 mg/dL (ref 5–40)

## 2021-03-09 ENCOUNTER — Other Ambulatory Visit: Payer: Self-pay | Admitting: *Deleted

## 2021-03-09 DIAGNOSIS — E782 Mixed hyperlipidemia: Secondary | ICD-10-CM

## 2021-03-23 ENCOUNTER — Telehealth: Payer: Self-pay | Admitting: Adult Health

## 2021-03-23 NOTE — Telephone Encounter (Signed)
I have reached out to [employer] on this to see if they could call the pt and if anything is needed on our side for this.

## 2021-03-23 NOTE — Telephone Encounter (Signed)
Pt's daughter has called to report the [facility]) has made multiple attempts for pt's CPAP supplies that are needed, please call

## 2021-03-24 DIAGNOSIS — D692 Other nonthrombocytopenic purpura: Secondary | ICD-10-CM | POA: Diagnosis not present

## 2021-03-24 DIAGNOSIS — E785 Hyperlipidemia, unspecified: Secondary | ICD-10-CM | POA: Diagnosis not present

## 2021-03-24 DIAGNOSIS — R7301 Impaired fasting glucose: Secondary | ICD-10-CM | POA: Diagnosis not present

## 2021-03-24 DIAGNOSIS — I729 Aneurysm of unspecified site: Secondary | ICD-10-CM | POA: Diagnosis not present

## 2021-03-24 DIAGNOSIS — R1310 Dysphagia, unspecified: Secondary | ICD-10-CM | POA: Diagnosis not present

## 2021-03-24 DIAGNOSIS — E871 Hypo-osmolality and hyponatremia: Secondary | ICD-10-CM | POA: Diagnosis not present

## 2021-03-24 DIAGNOSIS — D6869 Other thrombophilia: Secondary | ICD-10-CM | POA: Diagnosis not present

## 2021-03-24 DIAGNOSIS — Z8673 Personal history of transient ischemic attack (TIA), and cerebral infarction without residual deficits: Secondary | ICD-10-CM | POA: Diagnosis not present

## 2021-03-24 DIAGNOSIS — I480 Paroxysmal atrial fibrillation: Secondary | ICD-10-CM | POA: Diagnosis not present

## 2021-03-24 DIAGNOSIS — R945 Abnormal results of liver function studies: Secondary | ICD-10-CM | POA: Diagnosis not present

## 2021-03-24 DIAGNOSIS — G4733 Obstructive sleep apnea (adult) (pediatric): Secondary | ICD-10-CM | POA: Diagnosis not present

## 2021-03-24 DIAGNOSIS — R269 Unspecified abnormalities of gait and mobility: Secondary | ICD-10-CM | POA: Diagnosis not present

## 2021-03-25 ENCOUNTER — Ambulatory Visit (HOSPITAL_COMMUNITY): Payer: Medicare Other | Attending: Cardiology

## 2021-03-25 ENCOUNTER — Other Ambulatory Visit: Payer: Self-pay

## 2021-03-25 DIAGNOSIS — I639 Cerebral infarction, unspecified: Secondary | ICD-10-CM

## 2021-03-25 DIAGNOSIS — I480 Paroxysmal atrial fibrillation: Secondary | ICD-10-CM | POA: Diagnosis not present

## 2021-03-25 DIAGNOSIS — R5383 Other fatigue: Secondary | ICD-10-CM | POA: Diagnosis not present

## 2021-03-25 DIAGNOSIS — I3139 Other pericardial effusion (noninflammatory): Secondary | ICD-10-CM | POA: Diagnosis not present

## 2021-03-25 DIAGNOSIS — E782 Mixed hyperlipidemia: Secondary | ICD-10-CM

## 2021-03-25 LAB — ECHOCARDIOGRAM COMPLETE
Area-P 1/2: 5.18 cm2
S' Lateral: 2 cm

## 2021-04-23 ENCOUNTER — Ambulatory Visit: Payer: Medicare Other

## 2021-04-24 ENCOUNTER — Other Ambulatory Visit (HOSPITAL_COMMUNITY): Payer: Self-pay

## 2021-04-24 ENCOUNTER — Telehealth (HOSPITAL_COMMUNITY): Payer: Self-pay

## 2021-04-24 DIAGNOSIS — R1310 Dysphagia, unspecified: Secondary | ICD-10-CM

## 2021-04-24 DIAGNOSIS — R059 Cough, unspecified: Secondary | ICD-10-CM

## 2021-04-24 NOTE — Telephone Encounter (Signed)
Attempted to contact patient to schedule OP MBS - left voicemail. ?

## 2021-04-29 DIAGNOSIS — N3020 Other chronic cystitis without hematuria: Secondary | ICD-10-CM | POA: Diagnosis not present

## 2021-05-01 ENCOUNTER — Ambulatory Visit (HOSPITAL_COMMUNITY)
Admission: RE | Admit: 2021-05-01 | Discharge: 2021-05-01 | Disposition: A | Discharge status: Home / Self Care | Payer: Medicare Other | Source: Ambulatory Visit | Attending: Internal Medicine | Admitting: Internal Medicine

## 2021-05-01 DIAGNOSIS — R059 Cough, unspecified: Secondary | ICD-10-CM | POA: Insufficient documentation

## 2021-05-01 DIAGNOSIS — R1310 Dysphagia, unspecified: Secondary | ICD-10-CM | POA: Diagnosis not present

## 2021-05-07 ENCOUNTER — Other Ambulatory Visit: Payer: Medicare Other

## 2021-05-07 DIAGNOSIS — E782 Mixed hyperlipidemia: Secondary | ICD-10-CM

## 2021-05-07 LAB — LIPID PANEL
Chol/HDL Ratio: 2.1 ratio (ref 0.0–4.4)
Cholesterol, Total: 123 mg/dL (ref 100–199)
HDL: 58 mg/dL (ref >39)
LDL Chol Calc (NIH): 46 mg/dL (ref 0–99)
Triglycerides: 107 mg/dL (ref 0–149)
VLDL Cholesterol Cal: 19 mg/dL (ref 5–40)

## 2021-05-18 ENCOUNTER — Telehealth: Payer: Self-pay | Admitting: Cardiology

## 2021-05-18 NOTE — Telephone Encounter (Signed)
STAT if HR is under 50 or over 120 ?(normal HR is 60-100 beats per minute) ? ?What is your heart rate? 40 ? ?Do you have a log of your heart rate readings (document readings)? Apple watch alerted her last night that her heart rate dropped below 40 and alerted her 2 nights ago that her HR dropped below 40 ? ?Do you have any other symptoms? More tired than usual  ?

## 2021-05-18 NOTE — Telephone Encounter (Signed)
Patient walked in to clinic this afternoon asking to be seen for Apple Watch registering HR in 40's. ? ?Patient continues to deny symptoms (denies CP/SOB/dizziness). Offered next available appt with Dr. Marlou Porch for tomorrow 05/19/21 at 8:20AM. Patient accepted appt. ?

## 2021-05-18 NOTE — Telephone Encounter (Signed)
Daughter Sidonie calling to report patient's Apple Watch reports HR below 40 bpm, beginning Saturday. ? ?Patient denies CP, SOB, dizziness, palpitations. Daughter states patient is more tired than usual but has not been sleeping as much. ? ?History of A-fib. Patient and daughter checked radial pulse for full minute while on the phone with me and recorded a HR of 45 bpm. ? ?Patient and daughter are concerned and would like to be seen as soon as possible to see what is going on. Daughter is currently driving patient back from the beach and will be back in town after 3pm today. ? ?Will forward to Rushie Goltz, RN to see if patient can be seen in A-fib clinic. ?

## 2021-05-18 NOTE — Telephone Encounter (Signed)
Call transferred to [facility] [place] successfully ?

## 2021-05-19 ENCOUNTER — Encounter: Payer: Self-pay | Admitting: Cardiology

## 2021-05-19 ENCOUNTER — Ambulatory Visit (INDEPENDENT_AMBULATORY_CARE_PROVIDER_SITE_OTHER): Payer: Medicare Other

## 2021-05-19 ENCOUNTER — Ambulatory Visit (INDEPENDENT_AMBULATORY_CARE_PROVIDER_SITE_OTHER): Payer: Medicare Other | Admitting: Cardiology

## 2021-05-19 VITALS — BP 130/70 | HR 50 | Ht 60.0 in | Wt 149.0 lb

## 2021-05-19 DIAGNOSIS — E785 Hyperlipidemia, unspecified: Secondary | ICD-10-CM | POA: Diagnosis not present

## 2021-05-19 DIAGNOSIS — R001 Bradycardia, unspecified: Secondary | ICD-10-CM

## 2021-05-19 DIAGNOSIS — I495 Sick sinus syndrome: Secondary | ICD-10-CM

## 2021-05-19 DIAGNOSIS — G4733 Obstructive sleep apnea (adult) (pediatric): Secondary | ICD-10-CM | POA: Diagnosis not present

## 2021-05-19 DIAGNOSIS — I483 Typical atrial flutter: Secondary | ICD-10-CM

## 2021-05-19 DIAGNOSIS — I639 Cerebral infarction, unspecified: Secondary | ICD-10-CM | POA: Diagnosis not present

## 2021-05-19 DIAGNOSIS — Z8673 Personal history of transient ischemic attack (TIA), and cerebral infarction without residual deficits: Secondary | ICD-10-CM | POA: Diagnosis not present

## 2021-05-19 NOTE — Assessment & Plan Note (Signed)
Ablation in 2007 Dr. Lovena Le. ?

## 2021-05-19 NOTE — Patient Instructions (Signed)
Medication Instructions:  ?The current medical regimen is effective;  continue present plan and medications. ? ?*If you need a refill on your cardiac medications before your next appointment, please call your pharmacy* ? ?Testing/Procedures: ?ZIO XT- Long Term Monitor Instructions ? ?Your physician has requested you wear a ZIO patch monitor for 14 days.  ?This is a single patch monitor. [employer] supplies one patch monitor per enrollment. Additional ?stickers are not available. Please do not apply patch if you will be having a Nuclear Stress Test,  ?Echocardiogram, Cardiac CT, MRI, or Chest Xray during the period you would be wearing the  ?monitor. The patch cannot be worn during these tests. You cannot remove and re-apply the  ?ZIO XT patch monitor.  ?Your ZIO patch monitor will be mailed 3 day USPS to your address on file. It may take 3-5 days  ?to receive your monitor after you have been enrolled.  ?Once you have received your monitor, please review the enclosed instructions. Your monitor  ?has already been registered assigning a specific monitor serial # to you. ? ?Billing and Patient Assistance Program Information ? ?We have supplied [employer] with any of your insurance information on file for billing purposes. ?[employer] offers a sliding scale Patient Assistance Program for patients that do not have  ?insurance, or whose insurance does not completely cover the cost of the ZIO monitor.  ?You must apply for the Patient Assistance Program to qualify for this discounted rate.  ?To apply, please call [employer] at [phone], select option 4, select option 2, ask to apply for  ?Patient Assistance Program. Theodore Demark will ask your household income, and how many people  ?are in your household. They will quote your out-of-pocket cost based on that information.  ?[employer] will also be able to set up a 59-month interest-free payment plan if needed. ? ?Applying the monitor ?  ?Shave hair from upper left chest.  ?Hold abrader disc  by orange tab. Rub abrader in 40 strokes over the upper left chest as  ?indicated in your monitor instructions.  ?Clean area with 4 enclosed alcohol pads. Let dry.  ?Apply patch as indicated in monitor instructions. Patch will be placed under collarbone on left  ?side of chest with arrow pointing upward.  ?Rub patch adhesive wings for 2 minutes. Remove white label marked "1". Remove the white  ?label marked "2". Rub patch adhesive wings for 2 additional minutes.  ?While looking in a mirror, press and release button in center of patch. A small green light will  ?flash 3-4 times. This will be your only indicator that the monitor has been turned on.  ?Do not shower for the first 24 hours. You may shower after the first 24 hours.  ?Press the button if you feel a symptom. You will hear a small click. Record Date, Time and  ?Symptom in the Patient Logbook.  ?When you are ready to remove the patch, follow instructions on the last 2 pages of Patient  ?Logbook. Stick patch monitor onto the last page of Patient Logbook.  ?Place Patient Logbook in the blue and white box. Use locking tab on box and tape box closed  ?securely. The blue and white box has prepaid postage on it. Please place it in the mailbox as  ?soon as possible. Your physician should have your test results approximately 7 days after the  ?monitor has been [facility] to [facility]  ?Call [facility]at [phone] you have questions regarding  ?your ZIO XT patch  monitor. Call them immediately if you see an orange light blinking on your  ?monitor.  ?If your monitor falls off in less than 4 days, contact our Monitor department at [phone].  ?If your monitor becomes loose or falls off after 4 days call [employer] at [phone] for  ?suggestions on securing your monitor ? ? ? ?Follow-Up: ?At [facility], you and your health needs are our priority.  As part of our continuing mission to provide you with exceptional heart care, we  have created designated Provider Care Teams.  These Care Teams include your primary Cardiologist (physician) and Advanced Practice Providers (APPs -  Physician Assistants and Nurse Practitioners) who all work together to provide you with the care you need, when you need it. ? ?We recommend signing up for the patient portal called "MyChart".  Sign up information is provided on this After Visit Summary.  MyChart is used to connect with patients for Virtual Visits (Telemedicine).  Patients are able to view lab/test results, encounter notes, upcoming appointments, etc.  Non-urgent messages can be sent to your provider as well.   ?To learn more about what you can do with MyChart, go to [email or web address].   ? ?Your next appointment:   ?6 month(s) ? ?The format for your next appointment:   ?In Person ? ?Provider:   ?Robbie Lis, PA-C, Nicholes Rough, PA-C, Dayna Dunn, PA-C, Ermalinda Barrios, PA-C, Christen Bame, NP, or Richardson Dopp, PA-C       ? ? ?Thank you for choosing [place]!! ? ? ? ?Important Information About Sugar ? ? ? ? ?  ?

## 2021-05-19 NOTE — Assessment & Plan Note (Signed)
Continued fatigue and weakness.  Continue daily exercise ?

## 2021-05-19 NOTE — Progress Notes (Unsigned)
Applied a 14 day Zio Xt monitor to patient in the office ?

## 2021-05-19 NOTE — Assessment & Plan Note (Signed)
Currently on atorvastatin 10 mg, decrease dose based upon age.  Has aortic atherosclerosis.  Continue with treatment.  Prior stroke.  Last LDL 46.  Excellent.  No myalgias. ?

## 2021-05-19 NOTE — Assessment & Plan Note (Addendum)
Heart rate 45 radially, 40 on Apple Watch.  We will go ahead and check a Zio patch monitor for further evaluation.  Fatigue has been main complaint for quite some time.  Continue to perform conditioning efforts.  Explained to her that heart rates in the 40s during sleep are usually benign.  These do not warrant pacemaker implantation.  If symptoms were to occur during the day such as syncope, excessive dizziness and heart rate were less than 40, this may warrant pacemaker implantation.  We will check the ZIO monitor for further evaluation. ?

## 2021-05-19 NOTE — Progress Notes (Signed)
?Cardiology Office Note:   ? ?Date:  05/19/2021  ? ?ID:  [name], DOB 03/19/1931, MRN [id number] ? ?PCP:  Crist Infante, MD ?  ?[facility] Providers ?Cardiologist:  Candee Furbish, MD    ? ?Referring MD: Crist Infante, MD  ? ? ?History of Present Illness:   ? ?[name] is a 88 y.o. female here for the evaluation of bradycardia.  Apple Watch showed heart rates of 40-45, this occurred at night while sleeping.  She does have sleep apnea.  She does not like wearing her CPAP mask but she does wear it.  She checked it radially and it was 45.  She reported this for the past 3 days.  Denies any symptoms.  Perhaps more tired.  They came back from the beach.  Is on no AV nodal blocking agents. ? ?Past Medical History:  ?Diagnosis Date  ? Atrial fibrillation ([place])   ? Prior treatment with amiodarone, held sinus rhythm and drug stopped  ? Atrial flutter ([place])   ? Status post ablation prior to  2009  ? Chest pain   ? Nuclear, 2004, no scar or ischemia, EF 77%  ? Ejection fraction   ? EF 70%, nuclear,  2004  //     ? Fall   ? July, 2014  ? H/O bladder repair surgery   ? OSA (obstructive sleep apnea)   ? Right Achilles tendinitis   ? Sciatica   ? ? ?Past Surgical History:  ?Procedure Laterality Date  ? IR CT HEAD LTD  11/14/2019  ? IR PERCUTANEOUS ART THROMBECTOMY/INFUSION INTRACRANIAL INC DIAG ANGIO  11/14/2019  ? IR RADIOLOGIST EVAL & MGMT  02/20/2020  ? IR US GUIDE VASC ACCESS RIGHT  11/14/2019  ? no surgical hx    ? RADIOLOGY WITH ANESTHESIA N/A 11/13/2019  ? Procedure: IR WITH ANESTHESIA;  Surgeon: Luanne Bras, MD;  Location: [facility];  Service: Radiology;  Laterality: N/A;  ? ? ?Current Medications: ?Current Meds  ?Medication Sig  ? amLODipine (NORVASC) 2.5 MG tablet Take 1 tablet (2.5 mg total) by mouth daily.  ? apixaban (ELIQUIS) 5 MG TABS tablet Take 1 tablet (5 mg total) by mouth 2 (two) times daily.  ? atorvastatin (LIPITOR) 10 MG tablet Take 1 tablet (10 mg total) by mouth at bedtime.  ? cephALEXin (KEFLEX)  250 MG capsule Take 250 mg by mouth daily.  ? Cholecalciferol (VITAMIN D3) 50 MCG (2000 UT) CAPS Take 2,000 Units by mouth at bedtime.  ? Cyanocobalamin (VITAMIN B12 PO) Take 1 tablet by mouth at bedtime.  ? GEMTESA 75 MG TABS Take 1 tablet by mouth at bedtime.  ? mirtazapine (REMERON) 15 MG tablet Take 15 mg by mouth at bedtime.  ?  ? ?Allergies:   Bee venom, Mixed vespid venom, Codeine, Sulfa antibiotics, and Erythromycin  ? ?Social History  ? ?Socioeconomic History  ? Marital status: Widowed  ?  Spouse name: Not on file  ? Number of children: 4  ? Years of education: Not on file  ? Highest education level: Not on file  ?Occupational History  ? Not on file  ?Tobacco Use  ? Smoking status: Former  ?  Packs/day: 1.00  ?  Years: 10.00  ?  Pack years: 10.00  ?  Types: Cigarettes  ? Smokeless tobacco: Never  ?Vaping Use  ? Vaping Use: Never used  ?Substance and Sexual Activity  ? Alcohol use: Yes  ? Drug use: Never  ? Sexual activity: Not on file  ?  Other Topics Concern  ? Not on file  ?Social History Narrative  ? 11/24/20 husband passed, children stay with her at night  ? ?Social Determinants of Health  ? ?Financial Resource Strain: Not on file  ?Food Insecurity: Not on file  ?Transportation Needs: Not on file  ?Physical Activity: Not on file  ?Stress: Not on file  ?Social Connections: Not on file  ?  ? ?Family History: ?The patient's family history includes Cancer in her father and mother; Heart disease in her brother. ? ?ROS:   ?Please see the history of present illness.    ? All other systems reviewed and are negative. ? ?EKGs/Labs/Other Studies Reviewed:   ? ?The following studies were reviewed today: ?Echocardiogram 03/25/2021 ? ? 1. Left ventricular ejection fraction, by estimation, is 65 to 70%. Left  ?ventricular ejection fraction by 3D volume is 67 %. The left ventricle has  ?normal function. The left ventricle has no regional wall motion  ?abnormalities. There is severe left  ?ventricular hypertrophy of the  basal-septal segment. Left ventricular  ?diastolic parameters are indeterminate.  ? 2. Right ventricular systolic function is normal. The right ventricular  ?size is normal. There is normal pulmonary artery systolic pressure. The  ?estimated right ventricular systolic pressure is 10.9 mmHg.  ? 3. Left atrial size was moderately dilated.  ? 4. Right atrial size was mildly dilated.  ? 5. The mitral valve is normal in structure. Trivial mitral valve  ?regurgitation. No evidence of mitral stenosis.  ? 6. The aortic valve is normal in structure. Aortic valve regurgitation is  ?not visualized. No aortic stenosis is present.  ? 7. The inferior vena cava is normal in size with greater than 50%  ?respiratory variability, suggesting right atrial pressure of 3 mmHg.  ? ?EKG:  EKG is  ordered today.  The ekg ordered today demonstrates atrial fibrillation heart rate 50 with nonspecific ST-T wave changes.  Previous EKG in 2022 showed atrial fibrillation heart rate in the 70s. ? ?Recent Labs: ?02/25/2021: ALT 23; BUN 15; Creatinine, Ser 0.83; Hemoglobin 13.0; Platelets 197; Potassium 4.8; Sodium 145  ?Recent Lipid Panel ?   ?Component Value Date/Time  ? CHOL 123 05/07/2021 0000  ? TRIG 107 05/07/2021 0000  ? HDL 58 05/07/2021 0000  ? CHOLHDL 2.1 05/07/2021 0000  ? CHOLHDL 3.7 11/14/2019 0408  ? VLDL 25 11/14/2019 0408  ? [place] 46 05/07/2021 0000  ? ? ? ?Risk Assessment/Calculations:   ? ?CHA2DS2-VASc Score = 6  ? This indicates a 9.7% annual risk of stroke. ?The patient's score is based upon: ?CHF History: 0 ?HTN History: 0 ?Diabetes History: 0 ?Stroke History: 2 ?Vascular Disease History: 1 ?Age Score: 2 ?Gender Score: 1 ?  ? ? ?    ? ?   ? ?Physical Exam:   ? ?VS:  BP 130/70 (BP Location: Left Arm, Patient Position: Sitting, Cuff Size: Normal)   Pulse (!) 50   Ht 5' (1.524 m)   Wt 149 lb (67.6 kg)   LMP  (LMP Unknown)   SpO2 97%   BMI 29.10 kg/m?    ? ?Wt Readings from Last 3 Encounters:  ?05/19/21 149 lb (67.6 kg)   ?02/25/21 152 lb 3.2 oz (69 kg)  ?11/24/20 145 lb (65.8 kg)  ?  ? ?GEN: Slow gait well nourished, well developed in no acute distress ?HEENT: Normal ?NECK: No JVD; No carotid bruits ?LYMPHATICS: No lymphadenopathy ?CARDIAC: Irregularly irregular mildly bradycardic, no murmurs, no rubs, gallops ?RESPIRATORY:  Clear to auscultation  without rales, wheezing or rhonchi  ?ABDOMEN: Soft, non-tender, non-distended ?MUSCULOSKELETAL:  No edema; No deformity  ?SKIN: Warm and dry ?NEUROLOGIC:  Alert and oriented x 3 ?PSYCHIATRIC:  Normal affect  ? ?ASSESSMENT:   ? ?1. Bradycardia   ?2. Tachycardia-bradycardia syndrome ([place])   ?3. Typical atrial flutter ([place])   ?4. History of stroke   ?5. Hyperlipidemia LDL goal <70   ?6. Obstructive sleep apnea   ? ?PLAN:   ? ?In order of problems listed above: ? ?Tachycardia-bradycardia syndrome ([place]) ?Heart rate 45 radially, 40 on Apple Watch.  We will go ahead and check a Zio patch monitor for further evaluation.  Fatigue has been main complaint for quite some time.  Continue to perform conditioning efforts.  Explained to her that heart rates in the 40s during sleep are usually benign.  These do not warrant pacemaker implantation.  If symptoms were to occur during the day such as syncope, excessive dizziness and heart rate were less than 40, this may warrant pacemaker implantation.  We will check the ZIO monitor for further evaluation. ? ?Atrial flutter ([place]) ?Ablation in 2007 Dr. Lovena Le. ? ?History of stroke ?Continued fatigue and weakness.  Continue daily exercise ? ?Hyperlipidemia LDL goal <70 ?Currently on atorvastatin 10 mg, decrease dose based upon age.  Has aortic atherosclerosis.  Continue with treatment.  Prior stroke.  Last LDL 46.  Excellent.  No myalgias. ? ?Obstructive sleep apnea ?Dr. Joylene Draft has been following.  Wearing CPAP reluctantly she states. ?  ? ? ?  ? ? ?Medication Adjustments/Labs and Tests Ordered: ?Current medicines are reviewed at length with the patient today.   Concerns regarding medicines are outlined above.  ?Orders Placed This Encounter  ?Procedures  ? LONG TERM MONITOR (3-14 DAYS)  ? EKG 12-Lead  ? ?No orders of the defined types were placed in this encounter. ? ? ?Pati

## 2021-05-19 NOTE — Assessment & Plan Note (Signed)
Dr. Joylene Draft has been following.  Wearing CPAP reluctantly she states. ?

## 2021-05-19 NOTE — Telephone Encounter (Signed)
Pt is being seen in office by Dr Marlou Porch today. ?

## 2021-05-26 ENCOUNTER — Ambulatory Visit (INDEPENDENT_AMBULATORY_CARE_PROVIDER_SITE_OTHER): Payer: Medicare Other | Admitting: Adult Health

## 2021-05-26 ENCOUNTER — Encounter: Payer: Self-pay | Admitting: Adult Health

## 2021-05-26 VITALS — BP 122/76 | HR 50 | Ht 60.0 in | Wt 149.0 lb

## 2021-05-26 DIAGNOSIS — Z9989 Dependence on other enabling machines and devices: Secondary | ICD-10-CM | POA: Diagnosis not present

## 2021-05-26 DIAGNOSIS — I63431 Cerebral infarction due to embolism of right posterior cerebral artery: Secondary | ICD-10-CM

## 2021-05-26 DIAGNOSIS — G4733 Obstructive sleep apnea (adult) (pediatric): Secondary | ICD-10-CM | POA: Diagnosis not present

## 2021-05-26 DIAGNOSIS — G4719 Other hypersomnia: Secondary | ICD-10-CM

## 2021-05-26 NOTE — Progress Notes (Signed)
?[name] ?[name] ?[name]. [name] [id number] ?[phone] ? ?     STROKE FOLLOW UP NOTE ? ?[name] ?Date of Birth:  1931-06-05 ?Medical Record Number:  [id number]  ? ?Reason for Referral: stroke and CPAP follow up ? ?Chief Complaint  ?Patient presents with  ? Follow-up  ?  Rm 2 here for 6 month f/u pt she has been doing ok still experiencing fatigue on a daily basis.   ?  ? ?SUBJECTIVE: ? ? ?HPI:  ? ?Update 05/26/2021 JM: Patient returns for stroke and CPAP follow-up accompanied by her son ? ?Stroke: ?Overall stable without new stroke/TIA symptoms. Does have some mild imbalance and at times unsteadiness.  Admits to limited physical activity and no routine exercise.  Compliant on Eliquis and atorvastatin, denies side effects.  Blood pressure today 122/76.  Closely followed by PCP [name] and cardiology. ? ? ?OSA on CPAP: Further adjustments have since been made to CPAP due to continued elevated AHI (see telephone note 01/05/2021).  Current settings 6-12.  Review of compliance report over the past 30 days shows 29 out of 30 usage days with 24 days greater than 4 hours for 80% compliance.  Average usage 6.1 hours.  Residual AHI 5.7.  Leaks in the 95 percentile 2.9.  Pressure in the 95th percentile 7.6 with AutoPap settings 6-12. ? ?Continues use of CPAP reluctantly. She dislikes using it but continues to use it because her family wants her to.  Continued complaints of excessive daytime fatigue. She is currently taking mirtazapine 15 mg nightly - advised to discuss reducing dose and potentially discontinuing with [name] as this could be contributing to waking up feeling groggy and continued daytime fatigue.  Is being seen by Dr. Marlou Lynch currently wearing cardiac monitor for 14 days (has been on since last week) with concerns of tachybradycardia syndrome.  Epworth Sleepiness Scale 3/24.  Fatigue severity scale 59/63. ? ?No further concerns at this time. ? ? ? ? ?History provided  for reference purposes only ?Update 11/24/2020 JM: Returns for 36-monthstroke follow-up and initial CPAP compliance visit accompanied by her daughter, SColletta Lynch ? ?Stroke: ?Stable from stroke standpoint without new stroke/TIA symptoms.  Compliant on Eliquis and atorvastatin without side effects.  Blood pressure today 146/87. ? ?OSA on CPAP: ?Eval by Dr. ARexene Alberts- dx'd with moderate to severe sleep apnea as evidenced on HST completed 08/20/2020.  AutoPap initiated 10/03/2020.  ? ?CPAP compliance report from 10/25/2020 -11/23/2020 shows 24 out of 30 usage days and 17 days greater than 4 hours for 57% compliance.  Average usage 6.3 hours.  Residual AHI 10.9 with pressure in the 95th percentile 8.3 and leaks in the 95 percentile 26.7. ? ?Changed to a new mask (nasal pillows) approx 3 weeks ago per daughter as patient was having difficulty operating nasal mask by herself. Prior to mask change, the first month of therapy (9/10-10/9) shows 29 out of 30 usage days with 25 days greater than 4 hours for 83% compliance and residual AHI 3.9 with leaks in the 95th percentile 19.9 and pressure in the 95th percentile 7.7. ? ?She tolerates current mask well and is able to manage it better independently but isn't happy about using it - reports she uses it to "make my family happy". She is concerned regarding continued daytime fatigue. Epworth Sleepiness Scale 4/24 (prior 7/24).  Fatigue severity scale 63/63 (prior 1/63 although this may have been entered in error).  Followed by ASan Felipe  ? ?No  further concerns at this time ? ? ?Update 05/13/2020 JM: [name] returns for stroke follow-up after prior visit via video visit approximately 4 months ago.  She is accompanied by her daughter, [name].  Stable from stroke standpoint without new stroke/TIA symptoms.  She continues to struggle with daytime fatigue and gait impairment.  Previously working with therapies but currently on hold as she has been traveling but she continues to do  exercises routinely at home.  Occasionally ambulates with a cane more as a "safety net" and denies any recent falls.  Compliant on Eliquis and atorvastatin without associated side effects.  Blood pressure today 129/96.  Since prior visit, hospital admission 12/25-12/31 due to severe sepsis secondary to community-acquired pneumonia and possible UTI and has been routinely followed by pulmonology since discharge.  No new concerns at this time. ? ?Initial visit 01/09/2020 via [facility]: [name] is being seen for hospital follow-up accompanied by her daughter, [name].  She was discharged home on 12/10/2019 from [facility] after a 17-day stay. She does report increased fatigue since her stroke. Denies any weakness, speech/language impairment, or dysphagia.  Does have mild cognitive impairment.  Currently working with [facility]SLP. Denies new or worsening stroke/TIA symptoms. Remains on Eliquis 5 mg twice daily without bleeding or bruising.  Remains on atorvastatin 10 mg daily without myalgias.  Blood pressure monitored by [facility] therapies which has been stable. [name] (PCP) recently started her on Remeron '15mg'$  nightly to help with appetite stimulation.  Denies depression/anxiety.  No further concerns at this time. ? ?Stroke admission 11/13/2019 ?[name] is a 88 y.o. female with history of Afib/flutter s/p ablation, off AC due to persistent sinus rhythm, and a recent diagnosis of pneumonia (she was not improving on the first round of antibiotics tried and therefore was broadened to Augmentin on 11/12/2019), who presented on 11/13/2019 with acute onset right gaze preference and left-sided weakness.  Personally reviewed hospitalization pertinent progress notes, lab work and imaging with summary provided.  Initially evaluated by Dr. Erlinda Lynch with stroke work-up revealing right thalamic, cerebellar and frontal small infarcts with right PCA occlusion s/p tPA and IR with TICI 3 reperfusion w/ postprocedure small SAH, embolic  secondary to AF not on AC.  Initially placed on Eliquis but held after developing right groin pseudoaneurysm w/ hematoma post IR receiving thrombin injection 10/22 by Dr. Earleen Lynch with repeat injection 10/27 with resolution.  Followed by [facility] Dr. Doren Lynch without surgical repair indicated.  Eliquis restarted 10/28 in setting of recent stroke and known atrial fibrillation. Hx of HTN treated with Cleviprex stabilized with long-term BP goal normotensive range. Hx of HLD on atorvastatin 10 mg daily with LDL 26 therefore discontinued due to low LDL level.  Other stroke risk factors include advanced age, former tobacco use and EtOH use but no prior stroke history.  Other active problems include acute respiratory failure (resolved) and community-acquired pneumonia, and hx of emphysema, aortic atherosclerosis, acute blood loss anemia and hypokalemia.  Per therapy recommendations, discharge to [facility] on 11/23/2019 for ongoing therapy needs. ? ?Stroke: R thalamic, cerebellar, and frontal small infarcts with right PCA occlusion s/p tPA and IR with TICI3 from AF not on AC ?Small SAH interpeduncular cistern post IR ?CT Head - Age indeterminate small vessel disease including in the right thalamus, but no acute cortically based infarct or acute intracranial hemorrhage identified.  ?CTA H&N - Positive for occlusion of the Right PCA origin. No other large vessel occlusion, and generally mild for age atherosclerosis in  the head and neck.  ?Cerebral angio/ IR - Rt PCA occlusion with TICI 3 flow  ?CT head - Status post Right PCA endovascular revascularization with trace subarachnoid blood/contrast in the interpeduncular cistern. Small 10 mm area of contrast staining versus petechial hemorrhage in the medial right thalamus.  ?MRI head - R thalamus infarct with associated mild hemorrhage as noted on recent CT. There is also mild subarachnoid hemorrhage in the interpeduncular cistern unchanged from CT. Additional small areas of acute infarct in  the right cerebellum and right frontal cortex. Moderate atrophy  ?MRA head -  Right PCA patent post thrombectomy. There is a mild stenosis in the right PCA at the P1/ P2 junction.  ?2D Echo - EF 60-65%. No sou

## 2021-05-26 NOTE — Patient Instructions (Addendum)
Continue use of CPAP to ensure adequate sleep apnea treatment  ? ?Continue to follow with cardiology as advised ? ?Discuss ongoing use of Remeron with PCP - may be able to decrease dosage to see if this helps with persistent daytime fatigue  ? ?Continue Eliquis (apixaban) daily  and atorvastatin  for secondary stroke prevention ? ?Continue to follow up with PCP regarding cholesterol and blood pressure management  ?Maintain strict control of hypertension with blood pressure goal below 130/90 and cholesterol with LDL cholesterol (bad cholesterol) goal below 70 mg/dL.  ? ?Signs of a Stroke? Follow the BEFAST method:  ?Balance Watch for a sudden loss of balance, trouble with coordination or vertigo ?Eyes Is there a sudden loss of vision in one or both eyes? Or double vision?  ?Face: Ask the person to smile. Does one side of the face droop or is it numb?  ?Arms: Ask the person to raise both arms. Does one arm drift downward? Is there weakness or numbness of a leg? ?Speech: Ask the person to repeat a simple phrase. Does the speech sound slurred/strange? Is the person confused ? ?Time: If you observe any of these signs, call 911. ? ? ? ? ? ?Followup in the future with me in 1 year or call earlier if needed ? ? ? ? ? ? ?Thank you for coming to see [place] at [facility]. I hope we have been able to provide you high quality care today. ? ?You may receive a patient satisfaction survey over the next few weeks. We would appreciate your feedback and comments so that we may continue to improve ourselves and the health of our patients. ? ?

## 2021-05-27 DIAGNOSIS — H02834 Dermatochalasis of left upper eyelid: Secondary | ICD-10-CM | POA: Diagnosis not present

## 2021-05-27 DIAGNOSIS — H02831 Dermatochalasis of right upper eyelid: Secondary | ICD-10-CM | POA: Diagnosis not present

## 2021-05-27 DIAGNOSIS — H35371 Puckering of macula, right eye: Secondary | ICD-10-CM | POA: Diagnosis not present

## 2021-05-27 DIAGNOSIS — D3131 Benign neoplasm of right choroid: Secondary | ICD-10-CM | POA: Diagnosis not present

## 2021-05-27 DIAGNOSIS — H26492 Other secondary cataract, left eye: Secondary | ICD-10-CM | POA: Diagnosis not present

## 2021-05-27 DIAGNOSIS — Z961 Presence of intraocular lens: Secondary | ICD-10-CM | POA: Diagnosis not present

## 2021-06-01 DIAGNOSIS — Z20822 Contact with and (suspected) exposure to covid-19: Secondary | ICD-10-CM | POA: Diagnosis not present

## 2021-06-09 ENCOUNTER — Telehealth: Payer: Self-pay | Admitting: Cardiology

## 2021-06-09 DIAGNOSIS — R001 Bradycardia, unspecified: Secondary | ICD-10-CM | POA: Diagnosis not present

## 2021-06-09 NOTE — Telephone Encounter (Signed)
Spoke with [name]  re: final report of Afib/flutter on 05/30/2021 at 553am. Page 13 strip 6.  Full report has been uploaded and available for review. ?Will forward to ordering provider, Dr Marlou Porch for review and recommendation. ?

## 2021-06-09 NOTE — Telephone Encounter (Signed)
[name] with [employer] calling with abnormal zio patch results. ?

## 2021-07-20 ENCOUNTER — Other Ambulatory Visit: Payer: Self-pay

## 2021-07-20 DIAGNOSIS — I480 Paroxysmal atrial fibrillation: Secondary | ICD-10-CM

## 2021-07-20 MED ORDER — APIXABAN 5 MG PO TABS: 5.0000 mg | ORAL_TABLET | Freq: Two times a day (BID) | ORAL | 1 refills | Status: DC

## 2021-07-20 NOTE — Telephone Encounter (Signed)
Prescription refill request for Eliquis received. Indication: Aflutter Last office visit: 05/19/21 Anne Fu)  Scr: 0.83 (02/25/21)  Age: 88 Weight: 67.6kg  Appropriate dose and refill sent to requested pharmacy.

## 2021-07-22 IMAGING — CR DG CHEST 2V
2 series · 2 of 2 positions shown · non-contrast
Comparison: 11/14/2018

CLINICAL DATA: Fevers

EXAM:
CHEST - 2 VIEW

[chest lat]
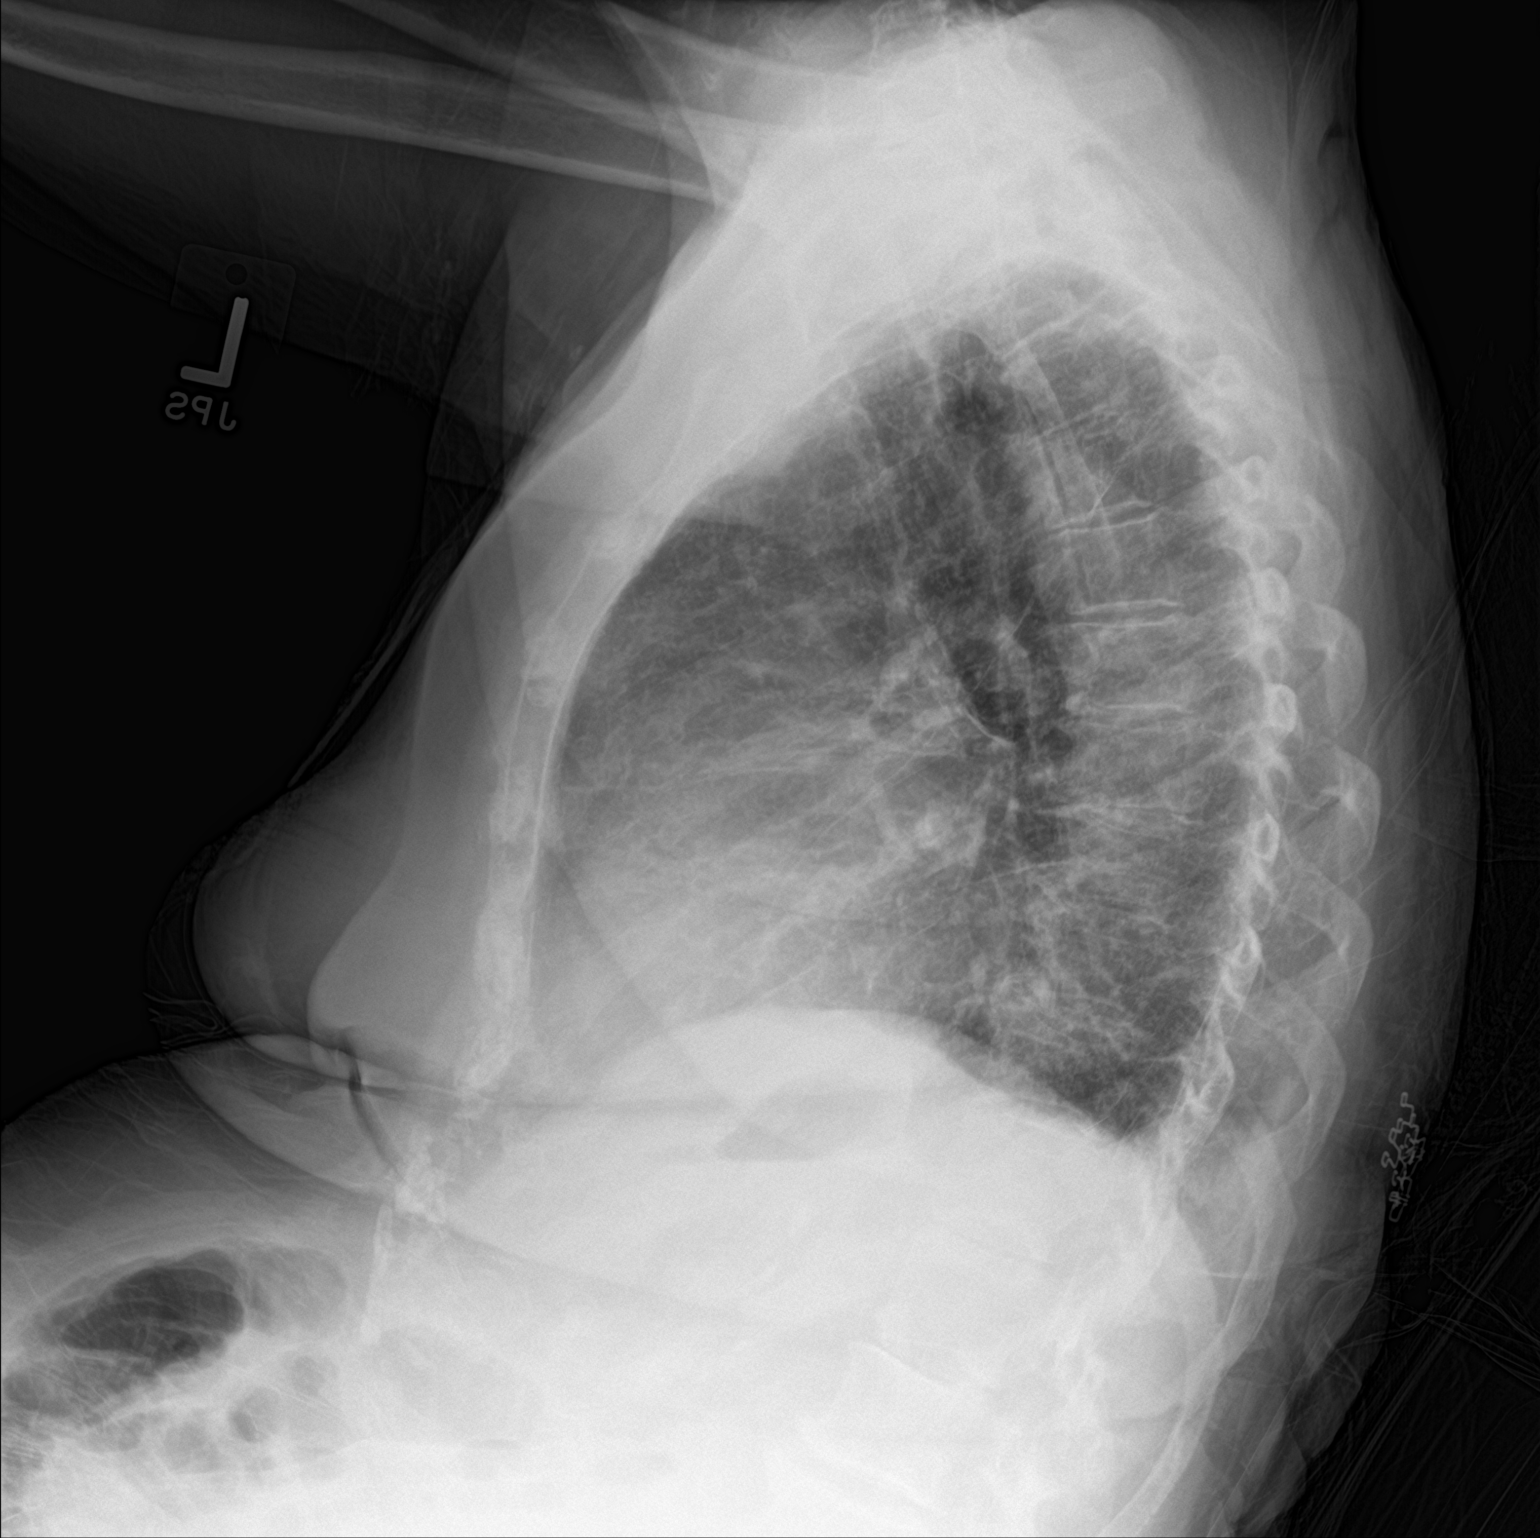

[chest ap]
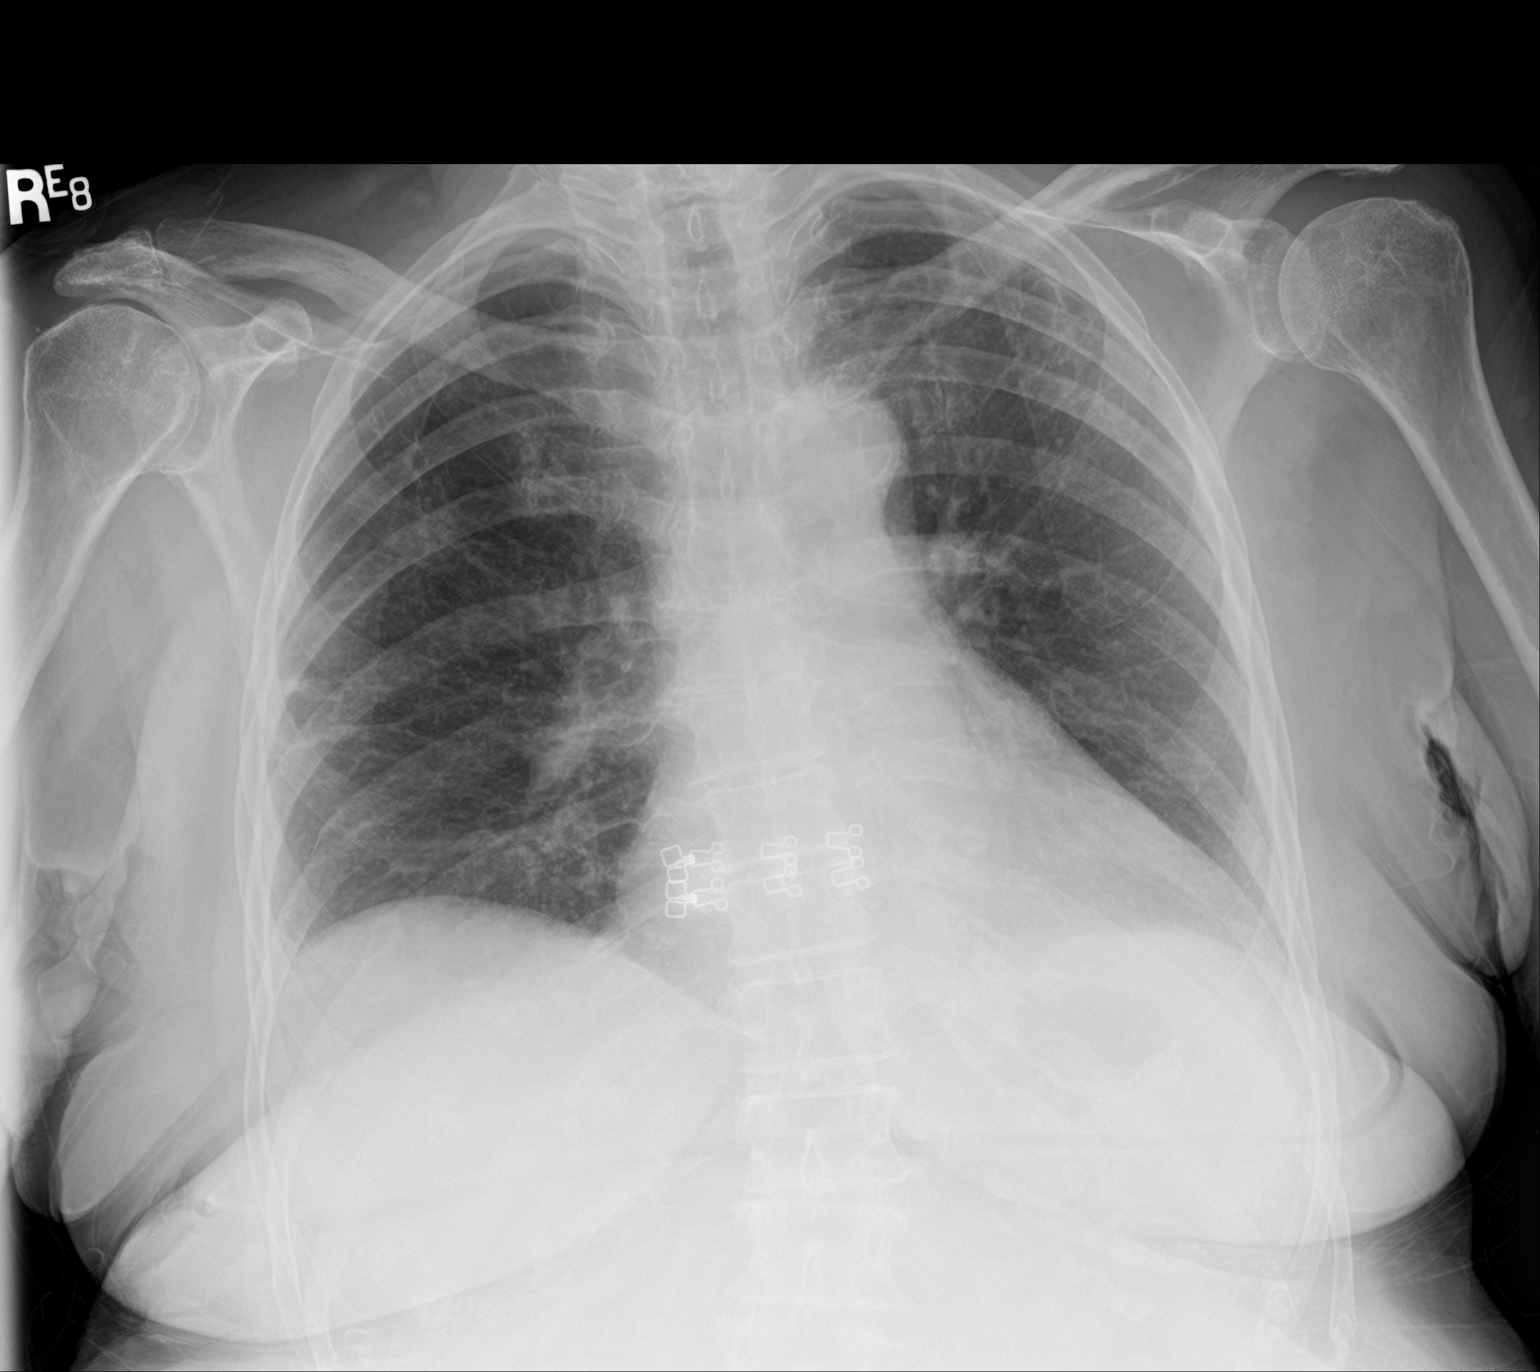

[2 of 2 positions shown; findings below may reference images not displayed]

FINDINGS: Cardiac shadow is stable. Aortic calcifications are again seen.
Scarring is noted in the subpleural regions bilaterally stable from
the prior exam. No focal infiltrate or sizable effusion is seen. No
bony abnormality is noted.
IMPRESSION: Scarring bilaterally stable from the prior study. No acute
abnormality noted.

## 2021-08-21 DIAGNOSIS — N3020 Other chronic cystitis without hematuria: Secondary | ICD-10-CM | POA: Diagnosis not present

## 2021-08-21 DIAGNOSIS — N3941 Urge incontinence: Secondary | ICD-10-CM | POA: Diagnosis not present

## 2021-09-11 DIAGNOSIS — Z85828 Personal history of other malignant neoplasm of skin: Secondary | ICD-10-CM | POA: Diagnosis not present

## 2021-09-11 DIAGNOSIS — L570 Actinic keratosis: Secondary | ICD-10-CM | POA: Diagnosis not present

## 2021-09-11 DIAGNOSIS — L821 Other seborrheic keratosis: Secondary | ICD-10-CM | POA: Diagnosis not present

## 2021-09-11 DIAGNOSIS — C44321 Squamous cell carcinoma of skin of nose: Secondary | ICD-10-CM | POA: Diagnosis not present

## 2021-09-11 DIAGNOSIS — D2262 Melanocytic nevi of left upper limb, including shoulder: Secondary | ICD-10-CM | POA: Diagnosis not present

## 2021-10-06 DIAGNOSIS — E785 Hyperlipidemia, unspecified: Secondary | ICD-10-CM | POA: Diagnosis not present

## 2021-10-06 DIAGNOSIS — R7989 Other specified abnormal findings of blood chemistry: Secondary | ICD-10-CM | POA: Diagnosis not present

## 2021-10-06 DIAGNOSIS — R7301 Impaired fasting glucose: Secondary | ICD-10-CM | POA: Diagnosis not present

## 2021-10-13 DIAGNOSIS — N1831 Chronic kidney disease, stage 3a: Secondary | ICD-10-CM | POA: Diagnosis not present

## 2021-10-13 DIAGNOSIS — Z1389 Encounter for screening for other disorder: Secondary | ICD-10-CM | POA: Diagnosis not present

## 2021-10-13 DIAGNOSIS — R945 Abnormal results of liver function studies: Secondary | ICD-10-CM | POA: Diagnosis not present

## 2021-10-13 DIAGNOSIS — D7589 Other specified diseases of blood and blood-forming organs: Secondary | ICD-10-CM | POA: Diagnosis not present

## 2021-10-13 DIAGNOSIS — D692 Other nonthrombocytopenic purpura: Secondary | ICD-10-CM | POA: Diagnosis not present

## 2021-10-13 DIAGNOSIS — R413 Other amnesia: Secondary | ICD-10-CM | POA: Diagnosis not present

## 2021-10-13 DIAGNOSIS — I480 Paroxysmal atrial fibrillation: Secondary | ICD-10-CM | POA: Diagnosis not present

## 2021-10-13 DIAGNOSIS — R7301 Impaired fasting glucose: Secondary | ICD-10-CM | POA: Diagnosis not present

## 2021-10-13 DIAGNOSIS — Z0000 Encounter for general adult medical examination without abnormal findings: Secondary | ICD-10-CM | POA: Diagnosis not present

## 2021-10-13 DIAGNOSIS — R82998 Other abnormal findings in urine: Secondary | ICD-10-CM | POA: Diagnosis not present

## 2021-10-13 DIAGNOSIS — E785 Hyperlipidemia, unspecified: Secondary | ICD-10-CM | POA: Diagnosis not present

## 2021-10-13 DIAGNOSIS — D6869 Other thrombophilia: Secondary | ICD-10-CM | POA: Diagnosis not present

## 2021-10-13 DIAGNOSIS — Z1331 Encounter for screening for depression: Secondary | ICD-10-CM | POA: Diagnosis not present

## 2021-10-22 ENCOUNTER — Other Ambulatory Visit: Payer: Self-pay | Admitting: Internal Medicine

## 2021-10-22 DIAGNOSIS — M5416 Radiculopathy, lumbar region: Secondary | ICD-10-CM

## 2021-10-23 ENCOUNTER — Ambulatory Visit
Admission: RE | Admit: 2021-10-23 | Discharge: 2021-10-23 | Disposition: A | Discharge status: Home / Self Care | Payer: Medicare Other | Source: Ambulatory Visit | Attending: Internal Medicine | Admitting: Internal Medicine

## 2021-10-23 DIAGNOSIS — M5416 Radiculopathy, lumbar region: Secondary | ICD-10-CM

## 2021-10-23 DIAGNOSIS — M48061 Spinal stenosis, lumbar region without neurogenic claudication: Secondary | ICD-10-CM | POA: Diagnosis not present

## 2021-10-26 DIAGNOSIS — E871 Hypo-osmolality and hyponatremia: Secondary | ICD-10-CM | POA: Diagnosis not present

## 2021-10-26 DIAGNOSIS — H5123 Internuclear ophthalmoplegia, bilateral: Secondary | ICD-10-CM | POA: Diagnosis not present

## 2021-10-26 DIAGNOSIS — M5416 Radiculopathy, lumbar region: Secondary | ICD-10-CM | POA: Diagnosis not present

## 2021-10-26 DIAGNOSIS — M62830 Muscle spasm of back: Secondary | ICD-10-CM | POA: Diagnosis not present

## 2021-10-26 DIAGNOSIS — D7589 Other specified diseases of blood and blood-forming organs: Secondary | ICD-10-CM | POA: Diagnosis not present

## 2021-10-26 DIAGNOSIS — N1831 Chronic kidney disease, stage 3a: Secondary | ICD-10-CM | POA: Diagnosis not present

## 2021-10-26 DIAGNOSIS — R7301 Impaired fasting glucose: Secondary | ICD-10-CM | POA: Diagnosis not present

## 2021-10-26 DIAGNOSIS — M5430 Sciatica, unspecified side: Secondary | ICD-10-CM | POA: Diagnosis not present

## 2021-10-26 DIAGNOSIS — E785 Hyperlipidemia, unspecified: Secondary | ICD-10-CM | POA: Diagnosis not present

## 2021-10-26 DIAGNOSIS — H3411 Central retinal artery occlusion, right eye: Secondary | ICD-10-CM | POA: Diagnosis not present

## 2021-10-26 DIAGNOSIS — R413 Other amnesia: Secondary | ICD-10-CM | POA: Diagnosis not present

## 2021-10-26 DIAGNOSIS — Z7952 Long term (current) use of systemic steroids: Secondary | ICD-10-CM | POA: Diagnosis not present

## 2021-10-26 DIAGNOSIS — R945 Abnormal results of liver function studies: Secondary | ICD-10-CM | POA: Diagnosis not present

## 2021-10-26 DIAGNOSIS — D72829 Elevated white blood cell count, unspecified: Secondary | ICD-10-CM | POA: Diagnosis not present

## 2021-10-26 DIAGNOSIS — J310 Chronic rhinitis: Secondary | ICD-10-CM | POA: Diagnosis not present

## 2021-10-26 DIAGNOSIS — I69320 Aphasia following cerebral infarction: Secondary | ICD-10-CM | POA: Diagnosis not present

## 2021-10-26 DIAGNOSIS — D6869 Other thrombophilia: Secondary | ICD-10-CM | POA: Diagnosis not present

## 2021-10-26 DIAGNOSIS — I480 Paroxysmal atrial fibrillation: Secondary | ICD-10-CM | POA: Diagnosis not present

## 2021-10-26 DIAGNOSIS — I729 Aneurysm of unspecified site: Secondary | ICD-10-CM | POA: Diagnosis not present

## 2021-10-26 DIAGNOSIS — D692 Other nonthrombocytopenic purpura: Secondary | ICD-10-CM | POA: Diagnosis not present

## 2021-10-26 DIAGNOSIS — M1990 Unspecified osteoarthritis, unspecified site: Secondary | ICD-10-CM | POA: Diagnosis not present

## 2021-10-26 DIAGNOSIS — Z8701 Personal history of pneumonia (recurrent): Secondary | ICD-10-CM | POA: Diagnosis not present

## 2021-10-26 DIAGNOSIS — H60502 Unspecified acute noninfective otitis externa, left ear: Secondary | ICD-10-CM | POA: Diagnosis not present

## 2021-10-26 DIAGNOSIS — G4733 Obstructive sleep apnea (adult) (pediatric): Secondary | ICD-10-CM | POA: Diagnosis not present

## 2021-10-26 DIAGNOSIS — R1310 Dysphagia, unspecified: Secondary | ICD-10-CM | POA: Diagnosis not present

## 2021-10-27 ENCOUNTER — Telehealth: Payer: Self-pay | Admitting: Adult Health

## 2021-10-27 NOTE — Telephone Encounter (Signed)
Informed pt of r/s for 5/2 appt- NP out.

## 2021-10-29 ENCOUNTER — Encounter (HOSPITAL_COMMUNITY): Payer: Self-pay

## 2021-10-29 ENCOUNTER — Emergency Department (HOSPITAL_BASED_OUTPATIENT_CLINIC_OR_DEPARTMENT_OTHER): Payer: Medicare Other | Admitting: Radiology

## 2021-10-29 ENCOUNTER — Encounter (HOSPITAL_BASED_OUTPATIENT_CLINIC_OR_DEPARTMENT_OTHER): Payer: Self-pay

## 2021-10-29 ENCOUNTER — Emergency Department (HOSPITAL_BASED_OUTPATIENT_CLINIC_OR_DEPARTMENT_OTHER): Payer: Medicare Other

## 2021-10-29 ENCOUNTER — Other Ambulatory Visit: Payer: Self-pay

## 2021-10-29 ENCOUNTER — Inpatient Hospital Stay (HOSPITAL_BASED_OUTPATIENT_CLINIC_OR_DEPARTMENT_OTHER)
Admission: EM | Admit: 2021-10-29 | Discharge: 2021-11-02 | DRG: 177 | Disposition: A | Discharge status: Home Health | Payer: Medicare Other | Attending: Internal Medicine | Admitting: Internal Medicine

## 2021-10-29 DIAGNOSIS — Z881 Allergy status to other antibiotic agents status: Secondary | ICD-10-CM

## 2021-10-29 DIAGNOSIS — R42 Dizziness and giddiness: Secondary | ICD-10-CM | POA: Diagnosis not present

## 2021-10-29 DIAGNOSIS — M1611 Unilateral primary osteoarthritis, right hip: Secondary | ICD-10-CM | POA: Diagnosis not present

## 2021-10-29 DIAGNOSIS — R944 Abnormal results of kidney function studies: Secondary | ICD-10-CM | POA: Diagnosis present

## 2021-10-29 DIAGNOSIS — G4733 Obstructive sleep apnea (adult) (pediatric): Secondary | ICD-10-CM | POA: Diagnosis not present

## 2021-10-29 DIAGNOSIS — Z87891 Personal history of nicotine dependence: Secondary | ICD-10-CM | POA: Diagnosis not present

## 2021-10-29 DIAGNOSIS — J439 Emphysema, unspecified: Secondary | ICD-10-CM | POA: Diagnosis present

## 2021-10-29 DIAGNOSIS — R079 Chest pain, unspecified: Secondary | ICD-10-CM | POA: Diagnosis not present

## 2021-10-29 DIAGNOSIS — Z91030 Bee allergy status: Secondary | ICD-10-CM

## 2021-10-29 DIAGNOSIS — Z7901 Long term (current) use of anticoagulants: Secondary | ICD-10-CM | POA: Diagnosis not present

## 2021-10-29 DIAGNOSIS — R110 Nausea: Secondary | ICD-10-CM | POA: Diagnosis not present

## 2021-10-29 DIAGNOSIS — Z8249 Family history of ischemic heart disease and other diseases of the circulatory system: Secondary | ICD-10-CM

## 2021-10-29 DIAGNOSIS — I10 Essential (primary) hypertension: Secondary | ICD-10-CM | POA: Diagnosis not present

## 2021-10-29 DIAGNOSIS — J9601 Acute respiratory failure with hypoxia: Secondary | ICD-10-CM | POA: Diagnosis present

## 2021-10-29 DIAGNOSIS — G4710 Hypersomnia, unspecified: Secondary | ICD-10-CM | POA: Diagnosis not present

## 2021-10-29 DIAGNOSIS — Z7952 Long term (current) use of systemic steroids: Secondary | ICD-10-CM

## 2021-10-29 DIAGNOSIS — R7989 Other specified abnormal findings of blood chemistry: Secondary | ICD-10-CM

## 2021-10-29 DIAGNOSIS — Z800 Family history of malignant neoplasm of digestive organs: Secondary | ICD-10-CM

## 2021-10-29 DIAGNOSIS — U071 COVID-19: Principal | ICD-10-CM | POA: Diagnosis present

## 2021-10-29 DIAGNOSIS — E785 Hyperlipidemia, unspecified: Secondary | ICD-10-CM | POA: Diagnosis not present

## 2021-10-29 DIAGNOSIS — M5441 Lumbago with sciatica, right side: Secondary | ICD-10-CM | POA: Diagnosis present

## 2021-10-29 DIAGNOSIS — Z885 Allergy status to narcotic agent status: Secondary | ICD-10-CM

## 2021-10-29 DIAGNOSIS — N390 Urinary tract infection, site not specified: Secondary | ICD-10-CM | POA: Diagnosis not present

## 2021-10-29 DIAGNOSIS — I700 Atherosclerosis of aorta: Secondary | ICD-10-CM | POA: Diagnosis present

## 2021-10-29 DIAGNOSIS — E8809 Other disorders of plasma-protein metabolism, not elsewhere classified: Secondary | ICD-10-CM | POA: Diagnosis not present

## 2021-10-29 DIAGNOSIS — E663 Overweight: Secondary | ICD-10-CM | POA: Diagnosis not present

## 2021-10-29 DIAGNOSIS — M48061 Spinal stenosis, lumbar region without neurogenic claudication: Secondary | ICD-10-CM | POA: Diagnosis present

## 2021-10-29 DIAGNOSIS — M25751 Osteophyte, right hip: Secondary | ICD-10-CM | POA: Diagnosis not present

## 2021-10-29 DIAGNOSIS — M5431 Sciatica, right side: Principal | ICD-10-CM

## 2021-10-29 DIAGNOSIS — R799 Abnormal finding of blood chemistry, unspecified: Secondary | ICD-10-CM

## 2021-10-29 DIAGNOSIS — I480 Paroxysmal atrial fibrillation: Secondary | ICD-10-CM | POA: Diagnosis not present

## 2021-10-29 DIAGNOSIS — D72829 Elevated white blood cell count, unspecified: Secondary | ICD-10-CM

## 2021-10-29 DIAGNOSIS — Z79899 Other long term (current) drug therapy: Secondary | ICD-10-CM | POA: Diagnosis not present

## 2021-10-29 DIAGNOSIS — R0902 Hypoxemia: Secondary | ICD-10-CM | POA: Diagnosis not present

## 2021-10-29 DIAGNOSIS — Z8673 Personal history of transient ischemic attack (TIA), and cerebral infarction without residual deficits: Secondary | ICD-10-CM | POA: Diagnosis not present

## 2021-10-29 DIAGNOSIS — R0600 Dyspnea, unspecified: Secondary | ICD-10-CM | POA: Diagnosis not present

## 2021-10-29 DIAGNOSIS — I4891 Unspecified atrial fibrillation: Secondary | ICD-10-CM | POA: Diagnosis not present

## 2021-10-29 DIAGNOSIS — J432 Centrilobular emphysema: Secondary | ICD-10-CM | POA: Diagnosis not present

## 2021-10-29 DIAGNOSIS — Z6828 Body mass index (BMI) 28.0-28.9, adult: Secondary | ICD-10-CM

## 2021-10-29 DIAGNOSIS — R4182 Altered mental status, unspecified: Secondary | ICD-10-CM | POA: Diagnosis not present

## 2021-10-29 LAB — BASIC METABOLIC PANEL
Anion gap: 9 (ref 5–15)
BUN: 20 mg/dL (ref 8–23)
CO2: 33 mmol/L — ABNORMAL HIGH (ref 22–32)
Calcium: 8.7 mg/dL — ABNORMAL LOW (ref 8.9–10.3)
Chloride: 98 mmol/L (ref 98–111)
Creatinine, Ser: 0.94 mg/dL (ref 0.44–1.00)
GFR, Estimated: 58 mL/min — ABNORMAL LOW (ref >60)
Glucose, Bld: 102 mg/dL — ABNORMAL HIGH (ref 70–99)
Potassium: 4.3 mmol/L (ref 3.5–5.1)
Sodium: 140 mmol/L (ref 135–145)

## 2021-10-29 LAB — URINALYSIS, ROUTINE W REFLEX MICROSCOPIC
Bilirubin Urine: NEGATIVE
Glucose, UA: NEGATIVE mg/dL
Hgb urine dipstick: NEGATIVE
Ketones, ur: NEGATIVE mg/dL
Leukocytes,Ua: NEGATIVE
Nitrite: NEGATIVE
Protein, ur: NEGATIVE mg/dL
Specific Gravity, Urine: 1.017 (ref 1.005–1.030)
pH: 7 (ref 5.0–8.0)

## 2021-10-29 LAB — CBC WITH DIFFERENTIAL/PLATELET
Abs Immature Granulocytes: 0.14 10*3/uL — ABNORMAL HIGH (ref 0.00–0.07)
Basophils Absolute: 0 10*3/uL (ref 0.0–0.1)
Basophils Relative: 0 %
Eosinophils Absolute: 0.1 10*3/uL (ref 0.0–0.5)
Eosinophils Relative: 1 %
HCT: 45 % (ref 36.0–46.0)
Hemoglobin: 15 g/dL (ref 12.0–15.0)
Immature Granulocytes: 1 %
Lymphocytes Relative: 8 %
Lymphs Abs: 1.1 10*3/uL (ref 0.7–4.0)
MCH: 33.3 pg (ref 26.0–34.0)
MCHC: 33.3 g/dL (ref 30.0–36.0)
MCV: 100 fL (ref 80.0–100.0)
Monocytes Absolute: 1.2 10*3/uL — ABNORMAL HIGH (ref 0.1–1.0)
Monocytes Relative: 8 %
Neutro Abs: 12.1 10*3/uL — ABNORMAL HIGH (ref 1.7–7.7)
Neutrophils Relative %: 82 %
Platelets: 202 10*3/uL (ref 150–400)
RBC: 4.5 MIL/uL (ref 3.87–5.11)
RDW: 12.3 % (ref 11.5–15.5)
WBC: 14.7 10*3/uL — ABNORMAL HIGH (ref 4.0–10.5)
nRBC: 0 % (ref 0.0–0.2)

## 2021-10-29 LAB — RESP PANEL BY RT-PCR (FLU A&B, COVID) ARPGX2
Influenza A by PCR: NEGATIVE
Influenza B by PCR: NEGATIVE
SARS Coronavirus 2 by RT PCR: POSITIVE — AB

## 2021-10-29 MED ORDER — MIRTAZAPINE 15 MG PO TABS
15.0000 mg | ORAL_TABLET | Freq: Every day | ORAL | Status: DC
  Administered 2021-10-30 – 2021-11-01 (×3): 15 mg via ORAL
  Filled 2021-10-29 (×3): qty 1

## 2021-10-29 MED ORDER — CEPHALEXIN 250 MG PO CAPS
250.0000 mg | ORAL_CAPSULE | Freq: Every day | ORAL | Status: DC
  Administered 2021-10-30 – 2021-11-02 (×4): 250 mg via ORAL
  Filled 2021-10-29 (×5): qty 1

## 2021-10-29 MED ORDER — VITAMIN D 25 MCG (1000 UNIT) PO TABS
2000.0000 [IU] | ORAL_TABLET | Freq: Every day | ORAL | Status: DC
  Administered 2021-10-29 – 2021-11-01 (×4): 2000 [IU] via ORAL
  Filled 2021-10-29 (×4): qty 2

## 2021-10-29 MED ORDER — AMLODIPINE BESYLATE 5 MG PO TABS
2.5000 mg | ORAL_TABLET | Freq: Every day | ORAL | Status: DC
  Administered 2021-10-30 – 2021-11-02 (×4): 2.5 mg via ORAL
  Filled 2021-10-29 (×4): qty 1

## 2021-10-29 MED ORDER — OXYCODONE-ACETAMINOPHEN 5-325 MG PO TABS
1.0000 | ORAL_TABLET | Freq: Once | ORAL | Status: AC
  Administered 2021-10-29: 1 via ORAL
  Filled 2021-10-29: qty 1

## 2021-10-29 MED ORDER — IOHEXOL 300 MG/ML  SOLN
80.0000 mL | Freq: Once | INTRAMUSCULAR | Status: AC | PRN
  Administered 2021-10-29: 80 mL via INTRAVENOUS

## 2021-10-29 MED ORDER — ATORVASTATIN CALCIUM 10 MG PO TABS
10.0000 mg | ORAL_TABLET | Freq: Every day | ORAL | Status: DC
  Administered 2021-10-29 – 2021-11-01 (×4): 10 mg via ORAL
  Filled 2021-10-29 (×4): qty 1

## 2021-10-29 MED ORDER — MIRABEGRON ER 25 MG PO TB24
25.0000 mg | ORAL_TABLET | Freq: Every day | ORAL | Status: DC
  Administered 2021-10-31 – 2021-11-02 (×3): 25 mg via ORAL
  Filled 2021-10-29 (×4): qty 1

## 2021-10-29 MED ORDER — APIXABAN 5 MG PO TABS
5.0000 mg | ORAL_TABLET | Freq: Two times a day (BID) | ORAL | Status: DC
  Administered 2021-10-29 – 2021-11-02 (×8): 5 mg via ORAL
  Filled 2021-10-29: qty 1
  Filled 2021-10-29 (×2): qty 2
  Filled 2021-10-29 (×5): qty 1

## 2021-10-29 MED ORDER — ACETAMINOPHEN 325 MG PO TABS
650.0000 mg | ORAL_TABLET | Freq: Once | ORAL | Status: AC
  Administered 2021-10-29: 650 mg via ORAL
  Filled 2021-10-29: qty 2

## 2021-10-29 NOTE — ED Notes (Addendum)
Patient up to ambulate in the room, only 15 feet.   Oxygen saturation remained 94-92% on room air.  Patient returned to bed due to feeling tired.  Oxygen saturation dropped to 86% on room air with heartrate of 54.  Patient in afib on the monitor.  No orthostatic bp changes noted.

## 2021-10-29 NOTE — ED Notes (Signed)
Patient agreed to placement of a purewick to control her incontinence of urine. Patient denies any complaints.  She was repositioned in bed for comfort.

## 2021-10-29 NOTE — ED Provider Notes (Signed)
[place] EMERGENCY DEPT Provider Note  CSN: [id number] Arrival date & time: 10/29/21 1653  Chief Complaint(s) Back Pain  HPI [name] is a 88 y.o. female with history of A-fib on Eliquis, COPD, stroke hypertension, hyperlipidemia presenting to the emergency department with back pain.  Patient has been having back pain for the past few weeks, worse today.  Had recent MRI demonstrating some foraminal stenosis concerning for sciatica.  She reports her pain radiates from her back down the back of her right thigh.  The pain is severe that she was not able to walk.  She lives alone, has family members who are able to help her.  Her children are at bedside.  She denies any bowel or bladder incontinence, reports some chronic urinary incontinence which is unchanged.  No numbness or tingling.  No fevers or chills.  No trauma   Past Medical History Past Medical History:  Diagnosis Date   Atrial fibrillation [facility])    Prior treatment with amiodarone, held sinus rhythm and drug stopped   Atrial flutter ([place])    Status post ablation prior to  2009   Chest pain    Nuclear, 2004, no scar or ischemia, EF 77%   Ejection fraction    EF 70%, nuclear,  2004  //      Fall    July, 2014   H/O bladder repair surgery    OSA (obstructive sleep apnea)    Right Achilles tendinitis    Sciatica    Patient Active Problem List   Diagnosis Date Noted   Tachycardia-bradycardia syndrome ([place]) 05/19/2021   Obstructive sleep apnea 05/19/2021   Pseudoaneurysm of femoral artery following procedure ([place]) 11/23/2019   CAP (community acquired pneumonia) 11/23/2019   Essential hypertension 11/23/2019   Hyperlipidemia LDL goal <70 11/23/2019   Emphysema of lung ([place]) 11/23/2019   Aortic atherosclerosis ([place]) 11/23/2019   Acute blood loss anemia 11/23/2019   Acute ischemic stroke (HCC)    Pseudoaneurysm (HCC)    Slow transit constipation    Chronic UTI    Acute ischemic right PCA stroke ([place])  s/p tPA and mechanical thrombectomy, d/t AF not on [facility] 11/14/2019   History of stroke 11/13/2019   Hymenoptera allergy 10/05/2014   SVT (supraventricular tachycardia) 10/20/2012   Atrial fibrillation ([place])    Atrial flutter ([place])    Home Medication(s) Prior to Admission medications   Medication Sig Start Date End Date Taking? Authorizing Provider  amLODipine (NORVASC) 2.5 MG tablet Take 1 tablet (2.5 mg total) by mouth daily. 02/25/21 02/25/22  Richardson Dopp T, PA-C  apixaban (ELIQUIS) 5 MG TABS tablet Take 1 tablet (5 mg total) by mouth 2 (two) times daily. 07/20/21 07/20/22  Jerline Pain, MD  atorvastatin (LIPITOR) 10 MG tablet Take 1 tablet (10 mg total) by mouth at bedtime. 02/25/21 02/25/22  Richardson Dopp T, PA-C  cephALEXin (KEFLEX) 250 MG capsule Take 250 mg by mouth daily. 01/31/20   [provider]  Cholecalciferol (VITAMIN D3) 50 MCG (2000 UT) CAPS Take 2,000 Units by mouth at bedtime.    [provider]  Cyanocobalamin (VITAMIN B12 PO) Take 1 tablet by mouth at bedtime.    [provider]  GEMTESA 75 MG TABS Take 1 tablet by mouth at bedtime. 02/02/21   [provider]  mirtazapine (REMERON) 15 MG tablet Take 15 mg by mouth at bedtime.    [provider]  Past Surgical History Past Surgical History:  Procedure Laterality Date   IR CT HEAD LTD  11/14/2019   IR PERCUTANEOUS ART THROMBECTOMY/INFUSION INTRACRANIAL INC DIAG ANGIO  11/14/2019   IR RADIOLOGIST EVAL & MGMT  02/20/2020   IR US GUIDE VASC ACCESS RIGHT  11/14/2019   no surgical hx     RADIOLOGY WITH ANESTHESIA N/A 11/13/2019   Procedure: IR WITH ANESTHESIA;  Surgeon: Luanne Bras, MD;  Location: [place];  Service: Radiology;  Laterality: N/A;   Family History Family History  Problem Relation Age of Onset   Cancer Mother    Cancer Father         pancreatic cancer   Heart disease Brother     Social History Social History   Tobacco Use   Smoking status: Former    Packs/day: 1.00    Years: 10.00    Total pack years: 10.00    Types: Cigarettes   Smokeless tobacco: Never  Vaping Use   Vaping Use: Never used  Substance Use Topics   Alcohol use: Yes   Drug use: Never   Allergies Bee venom, Mixed vespid venom, Codeine, Sulfa antibiotics, and Erythromycin  Review of Systems Review of Systems  All other systems reviewed and are negative.   Physical Exam Vital Signs  I have reviewed the triage vital signs BP 135/61   Pulse (!) 59   Temp 98.7 F (37.1 C) (Oral)   Resp 13   Ht 5' (1.524 m)   Wt 66.2 kg   LMP  (LMP Unknown)   SpO2 99%   BMI 28.49 kg/m  Physical Exam Vitals and nursing note reviewed.  Constitutional:      General: She is not in acute distress.    Appearance: She is well-developed.  HENT:     Head: Normocephalic and atraumatic.     Mouth/Throat:     Mouth: Mucous membranes are moist.  Eyes:     Pupils: Pupils are equal, round, and reactive to light.  Cardiovascular:     Rate and Rhythm: Normal rate and regular rhythm.     Heart sounds: No murmur heard. Pulmonary:     Effort: Pulmonary effort is normal. No respiratory distress.     Breath sounds: Normal breath sounds.  Abdominal:     General: Abdomen is flat.     Palpations: Abdomen is soft.     Tenderness: There is no abdominal tenderness.  Musculoskeletal:        General: No tenderness.     Right lower leg: No edema.     Left lower leg: No edema.  Skin:    General: Skin is warm and dry.  Neurological:     Mental Status: She is alert.     Comments: Strength 5 out of 5 in the bilateral lower extremities, no sensory deficit to light touch.  Positive straight leg raise on the right  Psychiatric:        Mood and Affect: Mood normal.        Behavior: Behavior normal.     ED Results and Treatments Labs (all labs ordered are listed,  but only abnormal results are displayed) Labs Reviewed  BASIC METABOLIC PANEL - Abnormal; Notable for the following components:      Result Value   CO2 33 (*)    Glucose, Bld 102 (*)    Calcium 8.7 (*)    GFR, Estimated 58 (*)    All other components within normal limits  CBC WITH DIFFERENTIAL/PLATELET -  Abnormal; Notable for the following components:   WBC 14.7 (*)    Neutro Abs 12.1 (*)    Monocytes Absolute 1.2 (*)    Abs Immature Granulocytes 0.14 (*)    All other components within normal limits  RESP PANEL BY RT-PCR (FLU A&B, COVID) ARPGX2  URINALYSIS, ROUTINE W REFLEX MICROSCOPIC                                                                                                                          Radiology CT Head Wo Contrast  Result Date: 10/29/2021 CLINICAL DATA:  Altered mental status EXAM: CT HEAD WITHOUT CONTRAST TECHNIQUE: Contiguous axial images were obtained from the base of the skull through the vertex without intravenous contrast. RADIATION DOSE REDUCTION: This exam was performed according to the departmental dose-optimization program which includes automated exposure control, adjustment of the mA and/or kV according to patient size and/or use of iterative reconstruction technique. COMPARISON:  06/09/2020 FINDINGS: Brain: No evidence of acute infarction, hemorrhage, hydrocephalus, extra-axial collection or mass lesion/mass effect. Mild atrophic and chronic white matter ischemic changes are noted. Vascular: No hyperdense vessel or unexpected calcification. Skull: Normal. Negative for fracture or focal lesion. Sinuses/Orbits: No acute finding. Other: None. IMPRESSION: Chronic atrophic and ischemic changes without acute abnormality. Electronically Signed   By: Inez Catalina M.D.   On: 10/29/2021 21:13   CT Abdomen Pelvis W Contrast  Result Date: 10/29/2021 CLINICAL DATA:  Sepsis EXAM: CT ABDOMEN AND PELVIS WITH CONTRAST TECHNIQUE: Multidetector CT imaging of the abdomen and  pelvis was performed using the standard protocol following bolus administration of intravenous contrast. RADIATION DOSE REDUCTION: This exam was performed according to the departmental dose-optimization program which includes automated exposure control, adjustment of the mA and/or kV according to patient size and/or use of iterative reconstruction technique. CONTRAST:  63m OMNIPAQUE IOHEXOL 300 MG/ML  SOLN COMPARISON:  12/20/2019 FINDINGS: Lower chest: No acute abnormality.  Small hiatal hernia Hepatobiliary: Tiny probable cyst within the right hepatic dome. Liver otherwise unremarkable. Gallbladder unremarkable. No intra or extrahepatic biliary ductal dilation. Pancreas: Unremarkable Spleen: Unremarkable Adrenals/Urinary Tract: Adrenal glands are unremarkable. Kidneys are normal, without renal calculi, focal lesion, or hydronephrosis. Bladder is unremarkable. Stomach/Bowel: Moderate sigmoid diverticulosis. The stomach, small bowel, and large bowel are otherwise unremarkable. No evidence of obstruction or focal inflammation. Appendix normal. No free intraperitoneal gas or fluid. Vascular/Lymphatic: Aortic atherosclerosis. No enlarged abdominal or pelvic lymph nodes. Reproductive: Status post hysterectomy. No adnexal masses. Other: No abdominal wall hernia or abnormality. No abdominopelvic ascites. Musculoskeletal: Osseous structures are age-appropriate. No acute bone abnormality. No lytic or blastic bone lesion. IMPRESSION: 1. No acute intra-abdominal pathology identified. No definite radiographic explanation for the patient's reported sepsis. 2. Small hiatal hernia. 3. Moderate sigmoid diverticulosis without superimposed acute inflammatory change. Aortic Atherosclerosis (ICD10-I70.0). Electronically Signed   By: AFidela SalisburyM.D.   On: 10/29/2021 20:58   DG Chest 1 View  Result Date: 10/29/2021 CLINICAL DATA:  Pt BIBA from home.  Pt c/o back and shoulder pain- has had MRI- was dx with sciatica. Pt has been  taking prednisone and tramadol with minimal releif, but has been able to perform ADLs. EXAM: CHEST  1 VIEW COMPARISON:  06/09/2020 FINDINGS: Lungs are clear. Borderline cardiomegaly.  Aortic Atherosclerosis (ICD10-170.0). Chronic blunting of the left lateral costophrenic angle. No pneumothorax. Visualized bones unremarkable. IMPRESSION: Stable chronic findings.  No acute cardiopulmonary disease. Electronically Signed   By: Lucrezia Europe M.D.   On: 10/29/2021 20:56   DG Hip Unilat With Pelvis 2-3 Views Right  Result Date: 10/29/2021 CLINICAL DATA:  Hip pain. EXAM: DG HIP (WITH OR WITHOUT PELVIS) 2-3V RIGHT COMPARISON:  CT abdomen and pelvis 12/20/2019 FINDINGS: There is no evidence of hip fracture or dislocation. Joint spaces are maintained. There are mild degenerative changes of both hips with acetabular sclerosis and osteophyte formation. There also degenerative changes of the lower lumbar spine. Soft tissues are within normal limits. IMPRESSION: 1. No acute fracture or dislocation. 2. Mild degenerative changes of both hips. Electronically Signed   By: Ronney Asters M.D.   On: 10/29/2021 18:13    Pertinent labs & imaging results that were available during my care of the patient were reviewed by me and considered in my medical decision making (see MDM for details).  Medications Ordered in ED Medications  amLODipine (NORVASC) tablet 2.5 mg (has no administration in time range)  apixaban (ELIQUIS) tablet 5 mg (has no administration in time range)  atorvastatin (LIPITOR) tablet 10 mg (has no administration in time range)  cephALEXin (KEFLEX) capsule 250 mg (has no administration in time range)  vitamin D3 capsule 2,000 Units (has no administration in time range)  mirabegron ER (MYRBETRIQ) tablet 25 mg (has no administration in time range)  mirtazapine (REMERON) tablet 15 mg (has no administration in time range)  oxyCODONE-acetaminophen (PERCOCET/ROXICET) 5-325 MG per tablet 1 tablet (1 tablet Oral Given  10/29/21 1727)  acetaminophen (TYLENOL) tablet 650 mg (650 mg Oral Given 10/29/21 1727)  iohexol (OMNIPAQUE) 300 MG/ML solution 80 mL (80 mLs Intravenous Contrast Given 10/29/21 2025)                                                                                                                                     Procedures Procedures  (including critical care time)  Medical Decision Making / ED Course   MDM:  88 year old female presenting to the emergency department with back and hip pain.  Patient well-appearing, exam notable for pain in the right back and hip.  Does have positive straight leg raise.  Strength reassuring.  Able to range hip with some discomfort.  Reviewed recent lumbar spine MRI.  Shows left foraminal stenosis at L4, L5, no right-sided findings.  She does have positive straight leg raise on the right.  Doubt occult infectious process in the spine, no fevers or chills, no neurologic symptoms.  No trauma to suggest fracture .obtain  hip x-ray given MRI not necessarily correlating with physical exam findings without evidence of fracture, dislocation.  No rashes to suggest skin lesion as cause of symptoms.  Does have elevated WBC count, no obvious infectious symptoms but reports frequent urine infections, will check urinalysis.  Pain significantly improved with oxycodone and Tylenol.  Patient taking tramadol at home.  Will attempt ambulation trial.  We will follow-up urinalysis.  If ambulation trial reassuring and urinalysis reassuring, likely discharge.  Clinical Course as of 10/29/21 2231  Thu Oct 29, 2021  2229 Work-up overall reassuring.  Patient has elevated WBC count but no clear source of infection.  CT abdomen pelvis negative.  Urinalysis negative.  Chest x-ray without sign of infection.  She was able to ambulate with assistance but per son patient not back at baseline.  He does not feel comfortable taking the patient home and request additional help at home.  Consult  social work and place as border status. [WS]    Clinical Course User Index [WS] Cristie Hem, MD     Additional history obtained: -Additional history obtained from family -External records from outside source obtained and reviewed including: Chart review including previous notes, labs, imaging, consultation notes   Lab Tests: -I ordered, reviewed, and interpreted labs.   The pertinent results include:   Labs Reviewed  BASIC METABOLIC PANEL - Abnormal; Notable for the following components:      Result Value   CO2 33 (*)    Glucose, Bld 102 (*)    Calcium 8.7 (*)    GFR, Estimated 58 (*)    All other components within normal limits  CBC WITH DIFFERENTIAL/PLATELET - Abnormal; Notable for the following components:   WBC 14.7 (*)    Neutro Abs 12.1 (*)    Monocytes Absolute 1.2 (*)    Abs Immature Granulocytes 0.14 (*)    All other components within normal limits  RESP PANEL BY RT-PCR (FLU A&B, COVID) ARPGX2  URINALYSIS, ROUTINE W REFLEX MICROSCOPIC      Imaging Studies ordered: I ordered imaging studies including CT head, CXR, CT A/P, XR right hip On my interpretation imaging demonstrates no acute process I independently visualized and interpreted imaging. I agree with the radiologist interpretation   Medicines ordered and prescription drug management: Meds ordered this encounter  Medications   oxyCODONE-acetaminophen (PERCOCET/ROXICET) 5-325 MG per tablet 1 tablet   acetaminophen (TYLENOL) tablet 650 mg   iohexol (OMNIPAQUE) 300 MG/ML solution 80 mL   amLODipine (NORVASC) tablet 2.5 mg   apixaban (ELIQUIS) tablet 5 mg   atorvastatin (LIPITOR) tablet 10 mg   cephALEXin (KEFLEX) capsule 250 mg   vitamin D3 capsule 2,000 Units   mirabegron ER (MYRBETRIQ) tablet 25 mg   mirtazapine (REMERON) tablet 15 mg    -I have reviewed the patients home medicines and have made adjustments as needed   Cardiac Monitoring: The patient was maintained on a cardiac monitor.   I personally viewed and interpreted the cardiac monitored which showed an underlying rhythm of: NSR  Social Determinants of Health:  Factors impacting patients care include: lives alone   Reevaluation: After the interventions noted above, I reevaluated the patient and found that they have stayed the same  Co morbidities that complicate the patient evaluation  Past Medical History:  Diagnosis Date   Atrial fibrillation [facility])    Prior treatment with amiodarone, held sinus rhythm and drug stopped   Atrial flutter ([place])    Status post ablation prior to  2009  Chest pain    Nuclear, 2004, no scar or ischemia, EF 77%   Ejection fraction    EF 70%, nuclear,  2004  //      Fall    July, 2014   H/O bladder repair surgery    OSA (obstructive sleep apnea)    Right Achilles tendinitis    Sciatica       Dispostion: Boarding for social work.    Final Clinical Impression(s) / ED Diagnoses Final diagnoses:  Sciatica of right side     This chart was dictated using voice recognition software.  Despite best efforts to proofread,  errors can occur which can change the documentation meaning.    Cristie Hem, MD 10/29/21 2231

## 2021-10-29 NOTE — ED Notes (Signed)
Patient O2 sat 86% poat medication for pain.  Placed on O2 via [place] at 2 L.  Will continue to monitor

## 2021-10-29 NOTE — ED Triage Notes (Signed)
Pt BIBA from home. Pt c/o back and shoulder pain- has had MRI- was dx with sciatica. Pt has been taking prednisone and tramadol with minimal releif, but has been able to perform ADLs. Pt states that today, she has been unable to get out of bed. Pain is on pt's right hip down her leg. Pt is very sensitive to fentanyl- received 25 mcg en route and immediately desatted. Also received '4mg'$  zofran.

## 2021-10-29 NOTE — ED Notes (Signed)
Attempted to ambulate pt, she needed maximum assistance. Able to bear weight without pain but was very dizzy and needed support from staff and family. Pt walked a couple of steps but unsteady on feet and family sts this is not normal for her. Pt placed back into bed with assistance from staff. RA trial was unsuccessful, she  was 89-91% and was placed back on O2.

## 2021-10-30 ENCOUNTER — Encounter (HOSPITAL_COMMUNITY): Payer: Self-pay | Admitting: Family Medicine

## 2021-10-30 DIAGNOSIS — M5431 Sciatica, right side: Secondary | ICD-10-CM | POA: Diagnosis present

## 2021-10-30 DIAGNOSIS — Z8249 Family history of ischemic heart disease and other diseases of the circulatory system: Secondary | ICD-10-CM | POA: Diagnosis not present

## 2021-10-30 DIAGNOSIS — J439 Emphysema, unspecified: Secondary | ICD-10-CM | POA: Diagnosis not present

## 2021-10-30 DIAGNOSIS — G4710 Hypersomnia, unspecified: Secondary | ICD-10-CM | POA: Diagnosis not present

## 2021-10-30 DIAGNOSIS — Z800 Family history of malignant neoplasm of digestive organs: Secondary | ICD-10-CM | POA: Diagnosis not present

## 2021-10-30 DIAGNOSIS — U071 COVID-19: Secondary | ICD-10-CM

## 2021-10-30 DIAGNOSIS — Z885 Allergy status to narcotic agent status: Secondary | ICD-10-CM | POA: Diagnosis not present

## 2021-10-30 DIAGNOSIS — J9601 Acute respiratory failure with hypoxia: Secondary | ICD-10-CM | POA: Diagnosis not present

## 2021-10-30 DIAGNOSIS — Z87891 Personal history of nicotine dependence: Secondary | ICD-10-CM | POA: Diagnosis not present

## 2021-10-30 DIAGNOSIS — I10 Essential (primary) hypertension: Secondary | ICD-10-CM | POA: Diagnosis not present

## 2021-10-30 DIAGNOSIS — E785 Hyperlipidemia, unspecified: Secondary | ICD-10-CM | POA: Diagnosis not present

## 2021-10-30 DIAGNOSIS — G4733 Obstructive sleep apnea (adult) (pediatric): Secondary | ICD-10-CM | POA: Diagnosis not present

## 2021-10-30 DIAGNOSIS — N390 Urinary tract infection, site not specified: Secondary | ICD-10-CM | POA: Diagnosis not present

## 2021-10-30 DIAGNOSIS — R944 Abnormal results of kidney function studies: Secondary | ICD-10-CM | POA: Diagnosis not present

## 2021-10-30 DIAGNOSIS — Z881 Allergy status to other antibiotic agents status: Secondary | ICD-10-CM | POA: Diagnosis not present

## 2021-10-30 DIAGNOSIS — M5441 Lumbago with sciatica, right side: Secondary | ICD-10-CM | POA: Diagnosis not present

## 2021-10-30 DIAGNOSIS — Z7901 Long term (current) use of anticoagulants: Secondary | ICD-10-CM | POA: Diagnosis not present

## 2021-10-30 DIAGNOSIS — M48061 Spinal stenosis, lumbar region without neurogenic claudication: Secondary | ICD-10-CM | POA: Diagnosis not present

## 2021-10-30 DIAGNOSIS — E663 Overweight: Secondary | ICD-10-CM | POA: Diagnosis not present

## 2021-10-30 DIAGNOSIS — Z79899 Other long term (current) drug therapy: Secondary | ICD-10-CM | POA: Diagnosis not present

## 2021-10-30 DIAGNOSIS — R7989 Other specified abnormal findings of blood chemistry: Secondary | ICD-10-CM | POA: Diagnosis not present

## 2021-10-30 DIAGNOSIS — I700 Atherosclerosis of aorta: Secondary | ICD-10-CM | POA: Diagnosis not present

## 2021-10-30 DIAGNOSIS — E8809 Other disorders of plasma-protein metabolism, not elsewhere classified: Secondary | ICD-10-CM | POA: Diagnosis not present

## 2021-10-30 DIAGNOSIS — Z8673 Personal history of transient ischemic attack (TIA), and cerebral infarction without residual deficits: Secondary | ICD-10-CM | POA: Diagnosis not present

## 2021-10-30 DIAGNOSIS — I480 Paroxysmal atrial fibrillation: Secondary | ICD-10-CM | POA: Diagnosis not present

## 2021-10-30 MED ORDER — SODIUM CHLORIDE 0.9 % IV SOLN: 200.0000 mg | Freq: Once | INTRAVENOUS | Status: DC

## 2021-10-30 MED ORDER — ONDANSETRON HCL 4 MG PO TABS: 4.0000 mg | ORAL_TABLET | Freq: Four times a day (QID) | ORAL | Status: DC | PRN

## 2021-10-30 MED ORDER — ONDANSETRON HCL 4 MG/2ML IJ SOLN: 4.0000 mg | Freq: Four times a day (QID) | INTRAMUSCULAR | Status: DC | PRN

## 2021-10-30 MED ORDER — ORAL CARE MOUTH RINSE: 15.0000 mL | OROMUCOSAL | Status: DC | PRN

## 2021-10-30 MED ORDER — SODIUM CHLORIDE 0.9 % IV SOLN
100.0000 mg | Freq: Every day | INTRAVENOUS | Status: AC
  Administered 2021-10-31 – 2021-11-01 (×2): 100 mg via INTRAVENOUS
  Filled 2021-10-30 (×2): qty 20

## 2021-10-30 MED ORDER — SODIUM CHLORIDE 0.9 % IV SOLN
100.0000 mg | INTRAVENOUS | Status: AC
  Administered 2021-10-30 (×2): 100 mg via INTRAVENOUS

## 2021-10-30 MED ORDER — AMLODIPINE BESYLATE 5 MG PO TABS
2.5000 mg | ORAL_TABLET | Freq: Once | ORAL | Status: AC
  Administered 2021-10-30: 2.5 mg via ORAL
  Filled 2021-10-30: qty 1

## 2021-10-30 MED ORDER — SODIUM CHLORIDE 0.9 % IV SOLN: 100.0000 mg | Freq: Every day | INTRAVENOUS | Status: DC

## 2021-10-30 MED ORDER — VITAMIN C 500 MG PO TABS
500.0000 mg | ORAL_TABLET | Freq: Every day | ORAL | Status: DC
  Administered 2021-10-30 – 2021-11-02 (×4): 500 mg via ORAL
  Filled 2021-10-30 (×4): qty 1

## 2021-10-30 MED ORDER — DEXAMETHASONE SODIUM PHOSPHATE 10 MG/ML IJ SOLN
10.0000 mg | Freq: Once | INTRAMUSCULAR | Status: AC
  Administered 2021-10-30: 10 mg via INTRAVENOUS
  Filled 2021-10-30: qty 1

## 2021-10-30 MED ORDER — LEVALBUTEROL TARTRATE 45 MCG/ACT IN AERO: 2.0000 | INHALATION_SPRAY | Freq: Three times a day (TID) | RESPIRATORY_TRACT | Status: DC | PRN

## 2021-10-30 MED ORDER — LEVALBUTEROL HCL 1.25 MG/0.5ML IN NEBU: 1.2500 mg | INHALATION_SOLUTION | Freq: Three times a day (TID) | RESPIRATORY_TRACT | Status: DC | PRN

## 2021-10-30 MED ORDER — ACETAMINOPHEN 325 MG PO TABS: 650.0000 mg | ORAL_TABLET | Freq: Four times a day (QID) | ORAL | Status: DC | PRN

## 2021-10-30 MED ORDER — OXYCODONE-ACETAMINOPHEN 5-325 MG PO TABS: 1.0000 | ORAL_TABLET | Freq: Four times a day (QID) | ORAL | Status: DC | PRN

## 2021-10-30 MED ORDER — ACETAMINOPHEN 650 MG RE SUPP: 650.0000 mg | Freq: Four times a day (QID) | RECTAL | Status: DC | PRN

## 2021-10-30 MED ORDER — GUAIFENESIN-DM 100-10 MG/5ML PO SYRP: 10.0000 mL | ORAL_SOLUTION | ORAL | Status: DC | PRN

## 2021-10-30 MED ORDER — ZINC SULFATE 220 (50 ZN) MG PO CAPS
220.0000 mg | ORAL_CAPSULE | Freq: Every day | ORAL | Status: DC
  Administered 2021-10-30 – 2021-11-02 (×4): 220 mg via ORAL
  Filled 2021-10-30 (×4): qty 1

## 2021-10-30 MED ORDER — DEXAMETHASONE SODIUM PHOSPHATE 10 MG/ML IJ SOLN
6.0000 mg | INTRAMUSCULAR | Status: DC
  Administered 2021-10-31: 6 mg via INTRAVENOUS
  Filled 2021-10-30: qty 1

## 2021-10-30 NOTE — Progress Notes (Signed)
Patient placed on home cpap with nasal pillows with 2L nasal cannula.  Patient tolerating well at this time.

## 2021-10-30 NOTE — ED Notes (Signed)
Myrbetriq to be given when transferred. Unavailable at facility

## 2021-10-30 NOTE — ED Provider Notes (Signed)
I assumed care at signout.  Patient was found to be positive for COVID-19.  Per nursing staff she did have generalized weakness and hypoxia on ambulation.  On room air and at rest her pulse ox is 85 to 89%.  She corrects with supplemental oxygen.  Patient is not typically on supplemental oxygen. Patient will be admitted due to respiratory failure from COVID-19 Patient denies any chest pain or shortness of breath at this time.  No cough  Discussed with Dr. Myna Hidalgo for admission Will start remdesivir and Decadron   Ripley Fraise, MD 10/30/21 [phone]

## 2021-10-30 NOTE — H&P (Signed)
History and Physical    Patient: [name] HGD:[id number] DOB: 08-09-1931 DOA: 10/29/2021 DOS: the patient was seen and examined on 10/30/2021 PCP: Crist Infante, MD  Patient coming from: Home  Chief Complaint:  Chief Complaint  Patient presents with   Back Pain   HPI: [name] is a 88 y.o. 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  She has had a mild cough and sore throat.  She has received 3 doses of COVID vaccine. He denied fever, chills, rhinorrhea, sore throat, wheezing or hemoptysis.  No chest pain, palpitations, diaphoresis, PND, orthopnea or pitting edema of the lower extremities.  No abdominal pain, nausea, emesis, diarrhea, constipation, melena or hematochezia.  No flank pain, dysuria, frequency or hematuria.  No polyuria, polydipsia, polyphagia or blurred vision.  She has been hypersomnolent after having the CVA.  ED course: Initial vital signs were temperature 98.7 F, pulse 73, respiration 18, BP 161/79 mmHg O2 sat 96% on room air.  The patient received dexamethasone 10 mg IVP, oxycodone 5 mg p.o. and remdesivir 100 mg IVPB.  Lab work: Her urinalysis was positive.  Coronavirus PCR was positive and influenza A/B negative.  CBC is her white count 14.7, hemoglobin 15.0 g/dL platelets 202.  BMP showed a CO2 of 33 mmol/L, glucose of 102 and calcium of 8.7 mg/deciliter.  The rest of the electrolytes, BUN and creatinine were normal.  Imaging: Portable 1 view chest radiograph with borderline  cardiomegaly, aortic atherosclerosis, but no acute cardiopulmonary disease.  CT head with no acute intracranial normality, but showed chronic atrophy and ischemic changes.  CT abdomen/pelvis with no acute intra-abdominal pathology.  Right hip x-ray without fracture or dislocation   Review of Systems: As mentioned in the history of present illness. All other systems reviewed and are negative. Past Medical History:  Diagnosis Date   Atrial fibrillation [facility])    Prior treatment with amiodarone, held sinus rhythm and drug stopped   Atrial flutter ([place])    Status post ablation prior to  2009   Chest pain    Nuclear, 2004, no scar or ischemia, EF 77%   Ejection fraction    EF 70%, nuclear,  2004  //      Fall    July, 2014   H/O bladder repair surgery    OSA (obstructive sleep apnea)    Right Achilles tendinitis    Sciatica    Past Surgical History:  Procedure Laterality Date   IR CT HEAD LTD  11/14/2019   IR PERCUTANEOUS ART THROMBECTOMY/INFUSION INTRACRANIAL INC DIAG ANGIO  11/14/2019   IR RADIOLOGIST EVAL & MGMT  02/20/2020   IR US GUIDE VASC ACCESS RIGHT  11/14/2019   no surgical hx     RADIOLOGY WITH ANESTHESIA N/A 11/13/2019   Procedure: IR WITH ANESTHESIA;  Surgeon: Luanne Bras, MD;  Location: [place];  Service: Radiology;  Laterality: N/A;   Social History:  reports that she has quit smoking. Her smoking use included cigarettes. She has a 10.00 pack-year smoking history. She has never used smokeless tobacco. She reports current alcohol use. She reports that she does  not use drugs.  Allergies  Allergen Reactions   Bee Venom Anaphylaxis   Mixed Vespid Venom Anaphylaxis   Codeine Other (See Comments)    Not known   Sulfa Antibiotics Itching and Nausea Only   Erythromycin Nausea And Vomiting    Family History  Problem Relation Age of Onset   Cancer Mother    Cancer Father        pancreatic cancer   Heart disease Brother     Prior to Admission medications    Medication Sig Start Date End Date Taking? Authorizing Provider  amLODipine (NORVASC) 2.5 MG tablet Take 1 tablet (2.5 mg total) by mouth daily. 02/25/21 02/25/22  Richardson Dopp T, PA-C  apixaban (ELIQUIS) 5 MG TABS tablet Take 1 tablet (5 mg total) by mouth 2 (two) times daily. 07/20/21 07/20/22  Jerline Pain, MD  atorvastatin (LIPITOR) 10 MG tablet Take 1 tablet (10 mg total) by mouth at bedtime. 02/25/21 02/25/22  Richardson Dopp T, PA-C  cephALEXin (KEFLEX) 250 MG capsule Take 250 mg by mouth daily. 01/31/20   [provider]  Cholecalciferol (VITAMIN D3) 50 MCG (2000 UT) CAPS Take 2,000 Units by mouth at bedtime.    [provider]  Cyanocobalamin (VITAMIN B12 PO) Take 1 tablet by mouth at bedtime.    [provider]  GEMTESA 75 MG TABS Take 1 tablet by mouth at bedtime. 02/02/21   [provider]  mirtazapine (REMERON) 15 MG tablet Take 15 mg by mouth at bedtime.    [provider]  predniSONE (DELTASONE) 5 MG tablet Take by mouth. 10/22/21   [provider]  traMADol (ULTRAM) 50 MG tablet Take 50 mg by mouth 2 (two) times daily as needed. 10/20/21   [provider]    Physical Exam: Vitals:   10/30/21 0930 10/30/21 1000 10/30/21 1215 10/30/21 1403  BP: (!) 130/55 (!) 116/55 122/61 135/65  Pulse: 63 62 (!) 45 (!) 59  Resp: '16 15 14 17  '$ Temp:    98.8 F (37.1 C)  TempSrc:    Oral  SpO2: 97% 96% 98% 100%  Weight:      Height:       Physical Exam Vitals reviewed.  HENT:     Head: Normocephalic.     Nose: No rhinorrhea.     Mouth/Throat:     Mouth: Mucous membranes are moist.  Eyes:     General: No scleral icterus.    Pupils: Pupils are equal, round, and reactive to light.  Neck:     Vascular: No JVD.  Cardiovascular:     Rate and Rhythm: Normal rate and regular rhythm.     Heart sounds: S1 normal and S2 normal.  Pulmonary:     Effort: Pulmonary effort is normal.     Breath sounds: Examination of the right-lower field  reveals rales. Examination of the left-lower field reveals rales. Rales present. No wheezing or rhonchi.  Abdominal:     General: Bowel sounds are normal. There is no distension.     Palpations: Abdomen is soft.     Tenderness: There is no abdominal tenderness.  Musculoskeletal:     Cervical back: Neck supple.     Right lower leg: No edema.     Left lower leg: No edema.     Comments: No sciatica pain at the moment.  Skin:    General: Skin is warm and dry.  Neurological:     General: No focal deficit present.  Mental Status: She is alert.  Psychiatric:        Mood and Affect: Mood normal.        Behavior: Behavior normal.    Data Reviewed:  There are no new results to review at this time.  Assessment and Plan: Principal Problem:   Acute respiratory failure with hypoxia ([place]) Superimposed:   Emphysema of lung ([place]) In the setting of:   COVID-19 virus infection Admit to telemetry/inpatient. Supplemental oxygen as needed. Bronchodilators as needed. Incentive spirometry while awake. Flutter valve exercises. Antitussives as needed. Vitamin C/zinc supplementation Dexamethasone 6 mg IVP daily. Continue remdesivir per pharmacy. Follow-up CBC, CMP and chemistry.  Active Problems:   Atrial fibrillation (H[facility]) Rate is currently controlled. Continue apixaban.    Essential hypertension Continue amlodipine 2.5 mg p.o. daily. An extra 2.5 mg dose was given due to SBP in the 170s. Continue to monitor blood pressure and heart rate.    Hyperlipidemia LDL goal <70 Continue atorvastatin 10 mg p.o. daily.    Aortic atherosclerosis (HCC) Continue statin as above.    Chronic UTI Continue daily cephalexin to 50 mg p.o. daily.    Obstructive sleep apnea Continue CPAP at bedtime.     Advance Care Planning:   Code Status: Full Code   Consults:   Family Communication:   Severity of Illness: The appropriate patient status for this patient is OBSERVATION. Observation  status is judged to be reasonable and necessary in order to provide the required intensity of service to ensure the patient's safety. The patient's presenting symptoms, physical exam findings, and initial radiographic and laboratory data in the context of their medical condition is felt to place them at decreased risk for further clinical deterioration. Furthermore, it is anticipated that the patient will be medically stable for discharge from the hospital within 2 midnights of admission.   Author: Reubin Milan, MD 10/30/2021 2:20 PM  For on call review [email or web address].   This document was prepared using Dragon voice recognition software and may contain some unintended transcription errors.

## 2021-10-30 NOTE — ED Notes (Signed)
Son returned call, he is aware of the dx and the plan to admit to the hospital

## 2021-10-30 NOTE — ED Notes (Signed)
Handoff report given to [facility] and to [employer] on 5E at [employer]

## 2021-10-30 NOTE — ED Notes (Signed)
Message left with son, Sherren Mocha, regarding planned admission.  Requested that he return the call for more information.

## 2021-10-30 NOTE — ED Notes (Signed)
Patient easily aroused.  She denies any complaints at this time.  States she is feeling better.

## 2021-10-31 DIAGNOSIS — E8809 Other disorders of plasma-protein metabolism, not elsewhere classified: Secondary | ICD-10-CM | POA: Diagnosis present

## 2021-10-31 DIAGNOSIS — U071 COVID-19: Secondary | ICD-10-CM | POA: Diagnosis present

## 2021-10-31 DIAGNOSIS — Z79899 Other long term (current) drug therapy: Secondary | ICD-10-CM | POA: Diagnosis not present

## 2021-10-31 DIAGNOSIS — I700 Atherosclerosis of aorta: Secondary | ICD-10-CM

## 2021-10-31 DIAGNOSIS — Z800 Family history of malignant neoplasm of digestive organs: Secondary | ICD-10-CM | POA: Diagnosis not present

## 2021-10-31 DIAGNOSIS — Z7901 Long term (current) use of anticoagulants: Secondary | ICD-10-CM | POA: Diagnosis not present

## 2021-10-31 DIAGNOSIS — R0600 Dyspnea, unspecified: Secondary | ICD-10-CM | POA: Diagnosis not present

## 2021-10-31 DIAGNOSIS — J432 Centrilobular emphysema: Secondary | ICD-10-CM | POA: Diagnosis not present

## 2021-10-31 DIAGNOSIS — J439 Emphysema, unspecified: Secondary | ICD-10-CM | POA: Diagnosis present

## 2021-10-31 DIAGNOSIS — D72829 Elevated white blood cell count, unspecified: Secondary | ICD-10-CM | POA: Diagnosis not present

## 2021-10-31 DIAGNOSIS — I10 Essential (primary) hypertension: Secondary | ICD-10-CM

## 2021-10-31 DIAGNOSIS — Z87891 Personal history of nicotine dependence: Secondary | ICD-10-CM | POA: Diagnosis not present

## 2021-10-31 DIAGNOSIS — M5441 Lumbago with sciatica, right side: Secondary | ICD-10-CM | POA: Diagnosis present

## 2021-10-31 DIAGNOSIS — G4733 Obstructive sleep apnea (adult) (pediatric): Secondary | ICD-10-CM | POA: Diagnosis present

## 2021-10-31 DIAGNOSIS — E785 Hyperlipidemia, unspecified: Secondary | ICD-10-CM | POA: Diagnosis present

## 2021-10-31 DIAGNOSIS — I480 Paroxysmal atrial fibrillation: Secondary | ICD-10-CM | POA: Diagnosis present

## 2021-10-31 DIAGNOSIS — R944 Abnormal results of kidney function studies: Secondary | ICD-10-CM | POA: Diagnosis present

## 2021-10-31 DIAGNOSIS — N390 Urinary tract infection, site not specified: Secondary | ICD-10-CM | POA: Diagnosis present

## 2021-10-31 DIAGNOSIS — J9601 Acute respiratory failure with hypoxia: Secondary | ICD-10-CM | POA: Diagnosis present

## 2021-10-31 DIAGNOSIS — M48061 Spinal stenosis, lumbar region without neurogenic claudication: Secondary | ICD-10-CM | POA: Diagnosis present

## 2021-10-31 DIAGNOSIS — R799 Abnormal finding of blood chemistry, unspecified: Secondary | ICD-10-CM | POA: Diagnosis not present

## 2021-10-31 DIAGNOSIS — G4710 Hypersomnia, unspecified: Secondary | ICD-10-CM | POA: Diagnosis present

## 2021-10-31 DIAGNOSIS — R7989 Other specified abnormal findings of blood chemistry: Secondary | ICD-10-CM | POA: Diagnosis present

## 2021-10-31 DIAGNOSIS — Z8673 Personal history of transient ischemic attack (TIA), and cerebral infarction without residual deficits: Secondary | ICD-10-CM | POA: Diagnosis not present

## 2021-10-31 DIAGNOSIS — M5431 Sciatica, right side: Secondary | ICD-10-CM | POA: Diagnosis present

## 2021-10-31 DIAGNOSIS — Z881 Allergy status to other antibiotic agents status: Secondary | ICD-10-CM | POA: Diagnosis not present

## 2021-10-31 DIAGNOSIS — Z8249 Family history of ischemic heart disease and other diseases of the circulatory system: Secondary | ICD-10-CM | POA: Diagnosis not present

## 2021-10-31 DIAGNOSIS — Z885 Allergy status to narcotic agent status: Secondary | ICD-10-CM | POA: Diagnosis not present

## 2021-10-31 DIAGNOSIS — E663 Overweight: Secondary | ICD-10-CM | POA: Diagnosis present

## 2021-10-31 LAB — CBC WITH DIFFERENTIAL/PLATELET
Abs Immature Granulocytes: 0.11 10*3/uL — ABNORMAL HIGH (ref 0.00–0.07)
Basophils Absolute: 0 10*3/uL (ref 0.0–0.1)
Basophils Relative: 0 %
Eosinophils Absolute: 0 10*3/uL (ref 0.0–0.5)
Eosinophils Relative: 0 %
HCT: 44.2 % (ref 36.0–46.0)
Hemoglobin: 14.7 g/dL (ref 12.0–15.0)
Immature Granulocytes: 1 %
Lymphocytes Relative: 7 %
Lymphs Abs: 0.9 10*3/uL (ref 0.7–4.0)
MCH: 33 pg (ref 26.0–34.0)
MCHC: 33.3 g/dL (ref 30.0–36.0)
MCV: 99.3 fL (ref 80.0–100.0)
Monocytes Absolute: 1.1 10*3/uL — ABNORMAL HIGH (ref 0.1–1.0)
Monocytes Relative: 8 %
Neutro Abs: 10.8 10*3/uL — ABNORMAL HIGH (ref 1.7–7.7)
Neutrophils Relative %: 84 %
Platelets: 200 10*3/uL (ref 150–400)
RBC: 4.45 MIL/uL (ref 3.87–5.11)
RDW: 12.1 % (ref 11.5–15.5)
WBC: 12.9 10*3/uL — ABNORMAL HIGH (ref 4.0–10.5)
nRBC: 0 % (ref 0.0–0.2)

## 2021-10-31 LAB — COMPREHENSIVE METABOLIC PANEL
ALT: 101 U/L — ABNORMAL HIGH (ref 0–44)
AST: 37 U/L (ref 15–41)
Albumin: 3.8 g/dL (ref 3.5–5.0)
Alkaline Phosphatase: 50 U/L (ref 38–126)
Anion gap: 7 (ref 5–15)
BUN: 25 mg/dL — ABNORMAL HIGH (ref 8–23)
CO2: 31 mmol/L (ref 22–32)
Calcium: 8.7 mg/dL — ABNORMAL LOW (ref 8.9–10.3)
Chloride: 104 mmol/L (ref 98–111)
Creatinine, Ser: 0.71 mg/dL (ref 0.44–1.00)
GFR, Estimated: >60 mL/min (ref >60)
Glucose, Bld: 157 mg/dL — ABNORMAL HIGH (ref 70–99)
Potassium: 4.3 mmol/L (ref 3.5–5.1)
Sodium: 142 mmol/L (ref 135–145)
Total Bilirubin: 0.8 mg/dL (ref 0.3–1.2)
Total Protein: 6.5 g/dL (ref 6.5–8.1)

## 2021-10-31 LAB — C-REACTIVE PROTEIN: CRP: 3.4 mg/dL — ABNORMAL HIGH (ref <1.0)

## 2021-10-31 LAB — PHOSPHORUS: Phosphorus: 2.9 mg/dL (ref 2.5–4.6)

## 2021-10-31 LAB — MAGNESIUM: Magnesium: 2.6 mg/dL — ABNORMAL HIGH (ref 1.7–2.4)

## 2021-10-31 LAB — D-DIMER, QUANTITATIVE: D-Dimer, Quant: <0.27 ug/mL-FEU (ref 0.00–0.50)

## 2021-10-31 LAB — FERRITIN: Ferritin: 175 ng/mL (ref 11–307)

## 2021-10-31 MED ORDER — METHYLPREDNISOLONE SODIUM SUCC 40 MG IJ SOLR
0.5000 mg/kg | Freq: Two times a day (BID) | INTRAMUSCULAR | Status: DC
  Administered 2021-10-31 – 2021-11-02 (×4): 33.2 mg via INTRAVENOUS
  Filled 2021-10-31 (×4): qty 1

## 2021-10-31 MED ORDER — PREDNISONE 50 MG PO TABS: 50.0000 mg | ORAL_TABLET | Freq: Every day | ORAL | Status: DC

## 2021-10-31 MED ORDER — IPRATROPIUM-ALBUTEROL 20-100 MCG/ACT IN AERS
1.0000 | INHALATION_SPRAY | Freq: Two times a day (BID) | RESPIRATORY_TRACT | Status: DC
  Administered 2021-11-01 – 2021-11-02 (×3): 1 via RESPIRATORY_TRACT
  Filled 2021-10-31: qty 4

## 2021-10-31 MED ORDER — IPRATROPIUM-ALBUTEROL 20-100 MCG/ACT IN AERS
1.0000 | INHALATION_SPRAY | Freq: Four times a day (QID) | RESPIRATORY_TRACT | Status: DC
  Administered 2021-10-31 (×3): 1 via RESPIRATORY_TRACT
  Filled 2021-10-31: qty 4

## 2021-10-31 NOTE — Progress Notes (Signed)
Telemetry called and notified that Pt had 5 beat runs of V Tach, Pt asymptomatic, resting comfortably. Provider notified via secure chat. No new orders received.

## 2021-10-31 NOTE — Evaluation (Signed)
Physical Therapy One Time Evaluation Patient Details Name: BECKI MCCASKILL MRN: [id number] DOB: 12-Feb-1931 Today's Date: 10/31/2021  History of Present Illness  SHANTEE HAYNE is a 88 y.o. 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  Clinical Impression  Patient evaluated by Physical Therapy with no further acute PT needs identified. All education has been completed and the patient has no further questions. Pt ambulated in hallway and used bathroom.  Pt overall supervision level.  Pt agreeable to use RW at home for stability.  Pt denies back and sciatica pain at this time.  Discussed f/u for sciatica and back pain with MD and/or OPPT.  Pt reports she feels comfortable with d/c home and has family in/out frequently to assist if needed.  Pt anticipates d/c home tomorrow. See below for any follow-up Physical Therapy or equipment needs. PT is signing off. Thank you for this referral.        Recommendations for follow up therapy are one component of a multi-disciplinary discharge planning process, led by the attending physician.  Recommendations may be updated based on patient status, additional functional criteria and insurance authorization.  Follow Up Recommendations Outpatient PT (f/u per pt, discussed seeing MD for sciatica pain or OPPT)      Assistance Recommended at Discharge PRN  Patient can return home with the following  Help with stairs or ramp for entrance    Equipment Recommendations None recommended  by PT  Recommendations for Other Services       Functional Status Assessment Patient has had a recent decline in their functional status and demonstrates the ability to make significant improvements in function in a reasonable and predictable amount of time.     Precautions / Restrictions Precautions Precautions: Fall Precaution Comments: monitor o2 sats - though suspect nail polish is effecting readings. urinary incontinent at baseline. Restrictions Weight Bearing Restrictions: No      Mobility  Bed Mobility               General bed mobility comments: pt in recliner    Transfers Overall transfer level: Needs assistance Equipment used: Rolling walker (2 wheels) Transfers: Sit to/from Stand Sit to Stand: Supervision           General transfer comment: supervision for safety, pt denies pain    Ambulation/Gait Ambulation/Gait assistance: Supervision, Min guard Gait Distance (Feet): 100 Feet Assistive device: Rolling walker (2 wheels) Gait Pattern/deviations: Step-through pattern, Decreased stride length Gait velocity: decr     General Gait Details: reciprocal gait and steady with RW, antalgic gait not observed and pt denies pain, SPO2 100% room air after ambulating and using bathroom  Stairs            Wheelchair Mobility    Modified Rankin (Stroke Patients Only)       Balance Overall balance assessment: Mild deficits observed, not formally tested, Needs assistance         Standing balance support: Bilateral upper extremity supported Standing balance-Leahy Scale: Poor Standing balance comment: static fair, utilized RW for mobility for improved stability  Pertinent Vitals/Pain Pain Assessment Pain Assessment: No/denies pain    Home Living Family/patient expects to be discharged to:: Private residence Living Arrangements: Alone Available Help at Discharge: Available PRN/intermittently (has  intermittent assistance during the day, and someone stays with her at night) Type of Home: House Home Access: Stairs to enter   [employer] of Steps: 2   Home Layout: Two level;Able to live on main level with bedroom/bathroom Home Equipment: Rollator (4 wheels);Shower seat      Prior Function Prior Level of Function : Independent/Modified Independent             Mobility Comments: has been using rollator ADLs Comments: grossly independent, supervision for bathing     Hand Dominance   Dominant Hand: Right    Extremity/Trunk Assessment   Upper Extremity Assessment Upper Extremity Assessment: Overall WFL for tasks assessed    Lower Extremity Assessment Lower Extremity Assessment: Overall WFL for tasks assessed    Cervical / Trunk Assessment Cervical / Trunk Assessment: Normal  Communication   Communication: No difficulties  Cognition Arousal/Alertness: Awake/alert Behavior During Therapy: WFL for tasks assessed/performed Overall Cognitive Status: Within Functional Limits for tasks assessed                                          General Comments      Exercises     Assessment/Plan    PT Assessment All further PT needs can be met in the next venue of care  PT Problem List Decreased strength;Pain;Decreased mobility;Decreased knowledge of use of DME       PT Treatment Interventions      PT Goals (Current goals can be found in the Care Plan section)  Acute Rehab PT Goals PT Goal Formulation: All assessment and education complete, DC therapy    Frequency       Co-evaluation               AM-PAC PT "6 Clicks" Mobility  Outcome Measure Help needed turning from your back to your side while in a flat bed without using bedrails?: A Little Help needed moving from lying on your back to sitting on the side of a flat bed without using bedrails?: A Little Help needed moving to and from a bed to a chair (including a  wheelchair)?: A Little Help needed standing up from a chair using your arms (e.g., wheelchair or bedside chair)?: A Little Help needed to walk in hospital room?: A Little Help needed climbing 3-5 steps with a railing? : A Little 6 Click Score: 18    End of Session Equipment Utilized During Treatment: Gait belt Activity Tolerance: Patient tolerated treatment well Patient left: with call bell/phone within reach;in chair;with chair alarm set Nurse Communication: Mobility status PT Visit Diagnosis: Difficulty in walking, not elsewhere classified (R26.2)    Time: 4621-9471 PT Time Calculation (min) (ACUTE ONLY): 29 min   Charges:   PT Evaluation $PT Eval Low Complexity: 1 Low        Kati PT, DPT Physical Therapist Acute Rehabilitation Services Preferred contact method: Secure Chat Weekend Pager Only: [phone] Office: [place] 10/31/2021, 3:57 PM

## 2021-10-31 NOTE — Evaluation (Signed)
Occupational Therapy Evaluation Patient Details Name: [name] MRN: [id number] DOB: 03-30-1931 Today's Date: 10/31/2021   History of Present Illness [name] is a 88 y.o. 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   Clinical Impression   [name] is an 88 year old woman who presents with above medical history. On evaluation she presents on [address] and somewhat sleepy with slow processing speeds but she was alert and oriented x 4. She demonstrated the ability to transfer out of bed, don socks, ambulate to bathroom with RW, perform toileting and standing grooming task. Patient performed all functional tasks on RA and o2 found to be at 93-04%. However patient wearing acrylic nail polish therefore it took increased amount of time for accurate pleth signal to read and patient could have recovered by then. No apparent signs of distress or dyspnea. From an OT standpoint patient has no needs.    Recommendations for follow up therapy are one component of a multi-disciplinary discharge planning process, led by the attending physician.  Recommendations may be updated based on patient status, additional functional criteria and insurance authorization.   Follow Up Recommendations  No OT follow up    Assistance Recommended at Discharge Intermittent Supervision/Assistance  Patient can return home with the following Assistance with cooking/housework;Direct supervision/assist for financial management;Direct  supervision/assist for medications management;Help with stairs or ramp for entrance    Functional Status Assessment  Patient has not had a recent decline in their functional status  Equipment Recommendations  None recommended by OT    Recommendations for Other Services       Precautions / Restrictions Precautions Precaution Comments: monitor o2 sats - though suspect nail polish is effecting readings. urinary incontinent at baseline. Restrictions Weight Bearing Restrictions: No      Mobility Bed Mobility Overal bed mobility: Independent                  Transfers Overall transfer level: Needs assistance Equipment used: Rolling walker (2 wheels) Transfers: Sit to/from Stand, Bed to chair/wheelchair/BSC             General transfer comment: Supervision to ambulate in room with RW. No overt loss of balance.      Balance Overall balance assessment: Mild deficits observed, not formally tested                                         ADL either performed or assessed with clinical judgement   ADL Overall ADL's : At baseline                                       General ADL Comments: Demonstrates ability to don socks and toileting, standing at sink to wash hands.     Vision Patient Visual Report: No change from baseline       Perception     Praxis  Pertinent Vitals/Pain Pain Assessment Pain Assessment: No/denies pain     Hand Dominance Right   Extremity/Trunk Assessment Upper Extremity Assessment Upper Extremity Assessment: Overall WFL for tasks assessed   Lower Extremity Assessment Lower Extremity Assessment: Defer to PT evaluation   Cervical / Trunk Assessment Cervical / Trunk Assessment: Normal   Communication Communication Communication: No difficulties   Cognition Arousal/Alertness: Awake/alert Behavior During Therapy: WFL for tasks assessed/performed Overall Cognitive Status: Within Functional  Limits for tasks assessed                                 General Comments: Alert to self, place, month, year and President. A little drowsy effecting processing speed     General Comments       Exercises     Shoulder Instructions      Home Living Family/patient expects to be discharged to:: Private residence Living Arrangements: Alone Available Help at Discharge: Available PRN/intermittently (has intermittent assistance during the day, and someone stays with her at night) Type of Home: House Home Access: Stairs to enter [employer] of Steps: 2   Home Layout: Two level;Able to live on main level with bedroom/bathroom     Bathroom Shower/Tub: Occupational psychologist: Standard     Home Equipment: Rollator (4 wheels);Shower seat          Prior Functioning/Environment               Mobility Comments: has been using rollator ADLs Comments: grossly independent, supervision for bathing        OT Problem List:        OT Treatment/Interventions:      OT Goals(Current goals can be found in the care plan section) Acute Rehab OT Goals OT Goal Formulation: All assessment and education complete, DC therapy  OT Frequency:      Co-evaluation              AM-PAC OT "6 Clicks" Daily Activity     Outcome Measure Help from another person eating meals?: None Help from another person taking care of personal grooming?: None Help from another person toileting, which includes using toliet, bedpan, or urinal?: None Help from another person bathing (including washing, rinsing, drying)?: None Help from another person to put on and taking off regular upper body clothing?: None Help from another person to put on and taking off regular lower body clothing?: None 6 Click Score: 24   End of Session Equipment Utilized During Treatment: Rolling walker (2 wheels)  Activity Tolerance: Patient tolerated treatment well Patient left: in  chair;with call bell/phone within reach;with family/visitor present  OT Visit Diagnosis: Muscle weakness (generalized) (M62.81)                Time: 8469-6295 OT Time Calculation (min): 38 min Charges:  OT General Charges $OT Visit: 1 Visit OT Evaluation $OT Eval Low Complexity: 1 Low OT Treatments $Self Care/Home Management : 23-37 mins  Gustavo Lah, OTR/L Acute Care Rehab Services  Office [phone]   Lenward Chancellor 10/31/2021, 12:52 PM

## 2021-10-31 NOTE — Progress Notes (Signed)
PROGRESS NOTE    [name]  VWU:[id number] DOB: 11/30/1931 DOA: 10/29/2021 PCP: Crist Infante, MD   Brief Narrative:  HPI per Dr. Tennis Must on 10/30/21 [name] is a 88 y.o. 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  She has had a mild cough and sore throat.  She has received 3 doses of COVID vaccine. He denied fever, chills, rhinorrhea, sore throat, wheezing or hemoptysis.  No chest pain, palpitations, diaphoresis, PND, orthopnea or pitting edema of the lower extremities.  No abdominal pain, nausea, emesis, diarrhea, constipation, melena or hematochezia.  No flank pain, dysuria, frequency or hematuria.  No polyuria, polydipsia, polyphagia or blurred vision.  She has been hypersomnolent after having the CVA.   ED course: Initial vital signs were temperature 98.7 F, pulse 73, respiration 18, BP 161/79 mmHg O2 sat 96% on room air.  The patient received dexamethasone 10 mg IVP, oxycodone 5 mg p.o. and remdesivir 100 mg IVPB.   Lab work: Her urinalysis was positive.  Coronavirus PCR was positive and influenza A/B negative.  CBC is her white count 14.7, hemoglobin 15.0 g/dL platelets 202.  BMP showed a CO2 of 33 mmol/L, glucose of 102 and calcium of 8.7 mg/deciliter.  The rest of the electrolytes, BUN and creatinine were normal.   Imaging: Portable 1 view chest radiograph with borderline cardiomegaly, aortic atherosclerosis, but no acute cardiopulmonary disease.  CT head with no acute intracranial  normality, but showed chronic atrophy and ischemic changes.  CT abdomen/pelvis with no acute intra-abdominal pathology.  Right hip x-ray without fracture or dislocation   **Interim History Patient's back pain is improving and she has been weaned off of oxygen from her COVID.  She has 1 more dose of remdesivir tomorrow morning and likely can be discharged after that with close follow-up and outpatient evaluation with neurosurgery for her back pain and lumbar stenosis.  Assessment and Plan: No notes have been filed under this hospital service. Service: Hospitalist  Acute respiratory failure with hypoxia ([place]) with superimposed emphysema of the lung in the setting of acute COVID virus 19 -Admit to telemetry/inpatient. -Supplemental oxygen as needed. -Bronchodilators scheduled with Combivent 1 puff IH every 6 and Xopenex inhaler 2 puffs IH every 8 as needed for wheezing or shortness of breath -Incentive spirometry while awake. -Flutter valve exercises. -Antitussives as needed with Robitussin-DM. -Vitamin C/zinc supplementation -Dexamethasone 6 mg IVP daily changed to Solu-Medrol. -Continue remdesivir per pharmacy and she has 1 more dose left tomorrow. -Check inflammatory marker trend -COVID-19 Labs  Recent Labs    10/31/21 0555  DDIMER <0.27  FERRITIN 175    Lab Results  Component Value Date   SARSCOV2NAA POSITIVE (A) 10/29/2021   [place] NEGATIVE 01/20/2020   [place] NEGATIVE 12/20/2019   [place] NEGATIVE 11/13/2019   -Airborne and contact precautions -Follow-up CBC, CMP and chemistry. -She will need an ambulatory home O2 screen prior to discharge and repeat chest x-ray in the a.m. given that she continues to have a cough  Atrial fibrillation ([place]) -Rate is currently controlled. -Continue apixaban. -Continue to monitor on telemetry   Essential hypertension -Continue  amlodipine 2.5 mg p.o. daily. -An extra 2.5 mg dose was given due to SBP in the 170s. -Continue  to monitor blood pressure and heart rate per protocol. -Last blood pressure reading was 135/69   Hyperlipidemia LDL goal <70 -Continue atorvastatin 10 mg p.o. daily.   Aortic atherosclerosis (HCC) -Continue statin as above.   Chronic UTI Continue daily cephalexin 250 mg p.o. daily for prophylaxis.   Obstructive sleep apnea Continue CPAP at bedtime.   Back pain -This was her presenting complaint -Recently placed on prednisone in outpatient setting by PCP and improved her back pain but worsened after prednisone stopped -MRI done and showed "Multilevel degenerative change with mild foraminal stenosis on the left at L4-L5." -Continue with analgesics and pain control and continue with oxycodone-acetaminophen 1 tab p.o. every 6 as needed for moderate-severe pain -Was taking tramadol in the outpatient setting but did not help -Continue with PT and OT and OT is recommending no follow-up -If back pain persists can follow-up with neurosurgery in the outpatient setting but they are unlikely to do anything given that she has mild stenosis  Elevated ALT -Mild and likely reactive -Patient's AST was 37 ALT is now 101 -Continue to monitor and trend and repeat CMP in the a.m and if necessary will obtain a right upper quadrant ultrasound as well as an acute hepatitis panel.  Elevated BUN -In setting of steroid demargination -Patient's BUN from 20 is now 25 and creatinine 0.71 -No signs or symptoms of acute upper GI bleeding -Repeat CBC in a.m.  Leukocytosis -Mild and likely reactive and trended up to 14.7 is now trending back down and has now 12.9 -Continue to monitor for signs and symptoms infection; no infection noted Repeat CBC in a.m.  DVT prophylaxis:  apixaban (ELIQUIS) tablet 5 mg    Code Status: Full Code Family Communication: Discussed with family at bedside and over the telephone while I was in the room  Disposition Plan:  Level of care: Telemetry Status is: Inpatient Remains  inpatient appropriate because: Need to be treated for her COVID infection and anticipating discharging home tomorrow after her last remdesivir dose   Consultants:  None  Procedures:  As delineated as above  Antimicrobials:  Anti-infectives (From admission, onward)    Start     Dose/Rate Route Frequency Ordered Stop   10/31/21 1000  remdesivir 100 mg in sodium chloride 0.9 % 100 mL IVPB        100 mg 200 mL/hr over 30 Minutes Intravenous Daily 10/30/21 0018 11/02/21 0959   10/30/21 1000  cephALEXin (KEFLEX) capsule 250 mg        250 mg Oral Daily 10/29/21 2228     10/30/21 1000  remdesivir 100 mg in sodium chloride 0.9 % 100 mL IVPB  Status:  Discontinued       See Hyperspace for full Linked Orders Report.   100 mg 200 mL/hr over 30 Minutes Intravenous Daily 10/30/21 0010 10/30/21 0017   10/30/21 0030  remdesivir 100 mg in sodium chloride 0.9 % 100 mL IVPB        100 mg 200 mL/hr over 30 Minutes Intravenous Every 30 min 10/30/21 0018 10/30/21 0130   10/30/21 0015  remdesivir 200 mg in sodium chloride 0.9% 250 mL IVPB  Status:  Discontinued       See Hyperspace for full Linked Orders Report.   200 mg 580 mL/hr over 30 Minutes Intravenous Once 10/30/21 0010 10/30/21 0017       Subjective: Seen and  examined at bedside and she is sitting in the chair and felt better and states that her back pain is resolved.  She is weaned off of oxygen when I saw her but did not have her home amatory screen done yet.  Denies any chest pain or shortness of breath.  States that she is feeling better today.  No other concerns or complaints this time.  Objective: Vitals:   10/31/21 1013 10/31/21 1038 10/31/21 1326 10/31/21 1338  BP: (!) 159/86   135/69  Pulse: (!) 46 (!) 40 (!) 56 61  Resp: '18 18 18 18  '$ Temp: 97.6 F (36.4 C)   97.8 F (36.6 C)  TempSrc: Oral   Oral  SpO2: 97% 100% 96% 98%  Weight:      Height:        Intake/Output Summary (Last 24 hours) at 10/31/2021 1426 Last data filed at  10/31/2021 1300 Gross per 24 hour  Intake 600 ml  Output 2200 ml  Net -1600 ml   Filed Weights   10/29/21 1658  Weight: 66.2 kg    Examination: Physical Exam:  Constitutional: WN/WD elderly overweight Caucasian female in no acute distress sitting in a chair at bedside Respiratory: Diminished to auscultation bilaterally with coarse breath sounds, no wheezing, rales, rhonchi or crackles. Normal respiratory effort and patient is not tachypenic. No accessory muscle use.  Unlabored breathing and not wearing supplemental oxygen via nasal cannula Cardiovascular: RRR, no murmurs / rubs / gallops. S1 and S2 auscultated. No extremity edema.  Abdomen: Soft, non-tender, distended secondary body habitus. Bowel sounds positive.  GU: Deferred. Musculoskeletal: No clubbing / cyanosis of digits/nails. No joint deformity upper and lower extremities. Skin: No rashes, lesions, ulcers limited skin evaluation. No induration; Warm and dry.  Neurologic: CN 2-12 grossly intact with no focal deficits.  Romberg sign and cerebellar reflexes not assessed.  Psychiatric: Normal judgment and insight. Alert and oriented x 3. Normal mood and appropriate affect.   Data Reviewed: I have personally reviewed following labs and imaging studies  CBC: Recent Labs  Lab 10/29/21 1720 10/31/21 0555  WBC 14.7* 12.9*  NEUTROABS 12.1* 10.8*  HGB 15.0 14.7  HCT 45.0 44.2  MCV 100.0 99.3  PLT 202 194   Basic Metabolic Panel: Recent Labs  Lab 10/29/21 1720 10/31/21 0555  NA 140 142  K 4.3 4.3  CL 98 104  CO2 33* 31  GLUCOSE 102* 157*  BUN 20 25*  CREATININE 0.94 0.71  CALCIUM 8.7* 8.7*  MG  --  2.6*  PHOS  --  2.9   GFR: Estimated Creatinine Clearance: 41.3 mL/min (by C-G formula based on SCr of 0.71 mg/dL). Liver Function Tests: Recent Labs  Lab 10/31/21 0555  AST 37  ALT 101*  ALKPHOS 50  BILITOT 0.8  PROT 6.5  ALBUMIN 3.8   No results for input(s): "LIPASE", "AMYLASE" in the last 168 hours. No  results for input(s): "AMMONIA" in the last 168 hours. Coagulation Profile: No results for input(s): "INR", "PROTIME" in the last 168 hours. Cardiac Enzymes: No results for input(s): "CKTOTAL", "CKMB", "CKMBINDEX", "TROPONINI" in the last 168 hours. BNP (last 3 results) No results for input(s): "PROBNP" in the last 8760 hours. HbA1C: No results for input(s): "HGBA1C" in the last 72 hours. CBG: No results for input(s): "GLUCAP" in the last 168 hours. Lipid Profile: No results for input(s): "CHOL", "HDL", "LDLCALC", "TRIG", "CHOLHDL", "LDLDIRECT" in the last 72 hours. Thyroid Function Tests: No results for input(s): "TSH", "T4TOTAL", "FREET4", "T3FREE", "THYROIDAB"  in the last 72 hours. Anemia Panel: Recent Labs    10/31/21 0555  FERRITIN 175   Sepsis Labs: No results for input(s): "PROCALCITON", "LATICACIDVEN" in the last 168 hours.  Recent Results (from the past 240 hour(s))  Resp Panel by RT-PCR (Flu A&B, Covid) Anterior Nasal Swab     Status: Abnormal   Collection Time: 10/29/21 10:28 PM   Specimen: Anterior Nasal Swab  Result Value Ref Range Status   SARS Coronavirus 2 by RT PCR POSITIVE (A) NEGATIVE Final    Comment: (NOTE) SARS-CoV-2 target nucleic acids are DETECTED.  The SARS-CoV-2 RNA is generally detectable in upper respiratory specimens during the acute phase of infection. Positive results are indicative of the presence of the identified virus, but do not rule out bacterial infection or co-infection with other pathogens not detected by the test. Clinical correlation with patient history and other diagnostic information is necessary to determine patient infection status. The expected result is Negative.  Fact Sheet for Patients: EntrepreneurPulse.com.au  Fact Sheet for Healthcare Providers: IncredibleEmployment.be  This test is not yet approved or cleared by the [place] FDA and  has been authorized for detection and/or  diagnosis of SARS-CoV-2 by FDA under an Emergency Use Authorization (EUA).  This EUA will remain in effect (meaning this test can be used) for the duration of  the COVID-19 declaration under Section 564(b)(1) of the A ct, 21 U.S.C. section 360bbb-3(b)(1), unless the authorization is terminated or revoked sooner.     Influenza A by PCR NEGATIVE NEGATIVE Final   Influenza B by PCR NEGATIVE NEGATIVE Final    Comment: (NOTE) The Xpert Xpress SARS-CoV-2/FLU/RSV plus assay is intended as an aid in the diagnosis of influenza from Nasopharyngeal swab specimens and should not be used as a sole basis for treatment. Nasal washings and aspirates are unacceptable for Xpert Xpress SARS-CoV-2/FLU/RSV testing.  Fact Sheet for Patients: EntrepreneurPulse.com.au  Fact Sheet for Healthcare Providers: IncredibleEmployment.be  This test is not yet approved or cleared by the [place] FDA and has been authorized for detection and/or diagnosis of SARS-CoV-2 by FDA under an Emergency Use Authorization (EUA). This EUA will remain in effect (meaning this test can be used) for the duration of the COVID-19 declaration under Section 564(b)(1) of the Act, 21 U.S.C. section 360bbb-3(b)(1), unless the authorization is terminated or revoked.  Performed at [facility], [address] [place], [place] [address]     Radiology Studies: CT Head Wo Contrast  Result Date: 10/29/2021 CLINICAL DATA:  Altered mental status EXAM: CT HEAD WITHOUT CONTRAST TECHNIQUE: Contiguous axial images were obtained from the base of the skull through the vertex without intravenous contrast. RADIATION DOSE REDUCTION: This exam was performed according to the departmental dose-optimization program which includes automated exposure control, adjustment of the mA and/or kV according to patient size and/or use of iterative reconstruction technique. COMPARISON:  06/09/2020 FINDINGS:  Brain: No evidence of acute infarction, hemorrhage, hydrocephalus, extra-axial collection or mass lesion/mass effect. Mild atrophic and chronic white matter ischemic changes are noted. Vascular: No hyperdense vessel or unexpected calcification. Skull: Normal. Negative for fracture or focal lesion. Sinuses/Orbits: No acute finding. Other: None. IMPRESSION: Chronic atrophic and ischemic changes without acute abnormality. Electronically Signed   By: Inez Catalina M.D.   On: 10/29/2021 21:13   CT Abdomen Pelvis W Contrast  Result Date: 10/29/2021 CLINICAL DATA:  Sepsis EXAM: CT ABDOMEN AND PELVIS WITH CONTRAST TECHNIQUE: Multidetector CT imaging of the abdomen and pelvis was performed using the standard protocol  following bolus administration of intravenous contrast. RADIATION DOSE REDUCTION: This exam was performed according to the departmental dose-optimization program which includes automated exposure control, adjustment of the mA and/or kV according to patient size and/or use of iterative reconstruction technique. CONTRAST:  13m OMNIPAQUE IOHEXOL 300 MG/ML  SOLN COMPARISON:  12/20/2019 FINDINGS: Lower chest: No acute abnormality.  Small hiatal hernia Hepatobiliary: Tiny probable cyst within the right hepatic dome. Liver otherwise unremarkable. Gallbladder unremarkable. No intra or extrahepatic biliary ductal dilation. Pancreas: Unremarkable Spleen: Unremarkable Adrenals/Urinary Tract: Adrenal glands are unremarkable. Kidneys are normal, without renal calculi, focal lesion, or hydronephrosis. Bladder is unremarkable. Stomach/Bowel: Moderate sigmoid diverticulosis. The stomach, small bowel, and large bowel are otherwise unremarkable. No evidence of obstruction or focal inflammation. Appendix normal. No free intraperitoneal gas or fluid. Vascular/Lymphatic: Aortic atherosclerosis. No enlarged abdominal or pelvic lymph nodes. Reproductive: Status post hysterectomy. No adnexal masses. Other: No abdominal wall hernia  or abnormality. No abdominopelvic ascites. Musculoskeletal: Osseous structures are age-appropriate. No acute bone abnormality. No lytic or blastic bone lesion. IMPRESSION: 1. No acute intra-abdominal pathology identified. No definite radiographic explanation for the patient's reported sepsis. 2. Small hiatal hernia. 3. Moderate sigmoid diverticulosis without superimposed acute inflammatory change. Aortic Atherosclerosis (ICD10-I70.0). Electronically Signed   By: AFidela SalisburyM.D.   On: 10/29/2021 20:58   DG Chest 1 View  Result Date: 10/29/2021 CLINICAL DATA:  Pt BIBA from home. Pt c/o back and shoulder pain- has had MRI- was dx with sciatica. Pt has been taking prednisone and tramadol with minimal releif, but has been able to perform ADLs. EXAM: CHEST  1 VIEW COMPARISON:  06/09/2020 FINDINGS: Lungs are clear. Borderline cardiomegaly.  Aortic Atherosclerosis (ICD10-170.0). Chronic blunting of the left lateral costophrenic angle. No pneumothorax. Visualized bones unremarkable. IMPRESSION: Stable chronic findings.  No acute cardiopulmonary disease. Electronically Signed   By: DLucrezia EuropeM.D.   On: 10/29/2021 20:56   DG Hip Unilat With Pelvis 2-3 Views Right  Result Date: 10/29/2021 CLINICAL DATA:  Hip pain. EXAM: DG HIP (WITH OR WITHOUT PELVIS) 2-3V RIGHT COMPARISON:  CT abdomen and pelvis 12/20/2019 FINDINGS: There is no evidence of hip fracture or dislocation. Joint spaces are maintained. There are mild degenerative changes of both hips with acetabular sclerosis and osteophyte formation. There also degenerative changes of the lower lumbar spine. Soft tissues are within normal limits. IMPRESSION: 1. No acute fracture or dislocation. 2. Mild degenerative changes of both hips. Electronically Signed   By: ARonney AstersM.D.   On: 10/29/2021 18:13    Scheduled Meds:  amLODipine  2.5 mg Oral Daily   apixaban  5 mg Oral BID   vitamin C  500 mg Oral Daily   atorvastatin  10 mg Oral QHS   cephALEXin  250 mg  Oral Daily   cholecalciferol  2,000 Units Oral QHS   Ipratropium-Albuterol  1 puff Inhalation Q6H   methylPREDNISolone (SOLU-MEDROL) injection  0.5 mg/kg Intravenous Q12H   Followed by   [Derrill MemoON 11/04/2021] predniSONE  50 mg Oral Daily   mirabegron ER  25 mg Oral Daily   mirtazapine  15 mg Oral QHS   zinc sulfate  220 mg Oral Daily   Continuous Infusions:  remdesivir 100 mg in sodium chloride 0.9 % 100 mL IVPB 100 mg (10/31/21 1019)    LOS: 0 days   ORaiford Noble DO [facility] Available via Epic secure chat 7am-7pm After these hours, please refer to coverage provider listed on [email or web address] 10/31/2021, 2:26 PM

## 2021-11-01 ENCOUNTER — Inpatient Hospital Stay (HOSPITAL_COMMUNITY): Payer: Medicare Other

## 2021-11-01 DIAGNOSIS — U071 COVID-19: Secondary | ICD-10-CM | POA: Diagnosis not present

## 2021-11-01 LAB — FERRITIN
Ferritin: 181 ng/mL (ref 11–307)
Ferritin: 186 ng/mL (ref 11–307)

## 2021-11-01 LAB — D-DIMER, QUANTITATIVE: D-Dimer, Quant: <0.27 ug/mL-FEU (ref 0.00–0.50)

## 2021-11-01 LAB — COMPREHENSIVE METABOLIC PANEL
ALT: 128 U/L — ABNORMAL HIGH (ref 0–44)
AST: 55 U/L — ABNORMAL HIGH (ref 15–41)
Albumin: 3.5 g/dL (ref 3.5–5.0)
Alkaline Phosphatase: 48 U/L (ref 38–126)
Anion gap: 11 (ref 5–15)
BUN: 25 mg/dL — ABNORMAL HIGH (ref 8–23)
CO2: 24 mmol/L (ref 22–32)
Calcium: 8.6 mg/dL — ABNORMAL LOW (ref 8.9–10.3)
Chloride: 106 mmol/L (ref 98–111)
Creatinine, Ser: 0.74 mg/dL (ref 0.44–1.00)
GFR, Estimated: >60 mL/min (ref >60)
Glucose, Bld: 174 mg/dL — ABNORMAL HIGH (ref 70–99)
Potassium: 4 mmol/L (ref 3.5–5.1)
Sodium: 141 mmol/L (ref 135–145)
Total Bilirubin: 0.8 mg/dL (ref 0.3–1.2)
Total Protein: 6.2 g/dL — ABNORMAL LOW (ref 6.5–8.1)

## 2021-11-01 LAB — CBC WITH DIFFERENTIAL/PLATELET
Abs Immature Granulocytes: 0.17 10*3/uL — ABNORMAL HIGH (ref 0.00–0.07)
Basophils Absolute: 0 10*3/uL (ref 0.0–0.1)
Basophils Relative: 0 %
Eosinophils Absolute: 0 10*3/uL (ref 0.0–0.5)
Eosinophils Relative: 0 %
HCT: 46.2 % — ABNORMAL HIGH (ref 36.0–46.0)
Hemoglobin: 14.8 g/dL (ref 12.0–15.0)
Immature Granulocytes: 1 %
Lymphocytes Relative: 5 %
Lymphs Abs: 0.8 10*3/uL (ref 0.7–4.0)
MCH: 33.2 pg (ref 26.0–34.0)
MCHC: 32 g/dL (ref 30.0–36.0)
MCV: 103.6 fL — ABNORMAL HIGH (ref 80.0–100.0)
Monocytes Absolute: 0.4 10*3/uL (ref 0.1–1.0)
Monocytes Relative: 3 %
Neutro Abs: 13.1 10*3/uL — ABNORMAL HIGH (ref 1.7–7.7)
Neutrophils Relative %: 91 %
Platelets: 214 10*3/uL (ref 150–400)
RBC: 4.46 MIL/uL (ref 3.87–5.11)
RDW: 12.1 % (ref 11.5–15.5)
WBC: 14.5 10*3/uL — ABNORMAL HIGH (ref 4.0–10.5)
nRBC: 0 % (ref 0.0–0.2)

## 2021-11-01 LAB — LACTATE DEHYDROGENASE: LDH: 256 U/L — ABNORMAL HIGH (ref 98–192)

## 2021-11-01 LAB — PHOSPHORUS: Phosphorus: 3.3 mg/dL (ref 2.5–4.6)

## 2021-11-01 LAB — C-REACTIVE PROTEIN
CRP: 1.9 mg/dL — ABNORMAL HIGH (ref <1.0)
CRP: 2 mg/dL — ABNORMAL HIGH (ref <1.0)

## 2021-11-01 LAB — MAGNESIUM: Magnesium: 2.6 mg/dL — ABNORMAL HIGH (ref 1.7–2.4)

## 2021-11-01 LAB — SEDIMENTATION RATE: Sed Rate: 2 mm/hr (ref 0–22)

## 2021-11-01 LAB — FIBRINOGEN: Fibrinogen: 386 mg/dL (ref 210–475)

## 2021-11-01 NOTE — Progress Notes (Addendum)
PROGRESS NOTE    [name]  FFM:[id number]  DOB: March 20, 1931  DOA: 10/29/2021 PCP: Melissa Infante, MD Outpatient Specialists:   Hospital course:  88 y.o. 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  Patient was admitted for management of COVID hypoxia even though she had primarily presented for management of back pain.  Subjective:  Patient states that she is so relieved that her back pain is magically gone.  She notes that when her back pain is there she cannot get out of bed.  She notes that her PCP Dr. Haynes Lynch has given her steroids before and while her pain is manageable when she is on the prednisone, as soon as the prednisone wears off she cannot get up out of bed.  Patient notes she is no longer short of breath and feels well.  Denies sleepiness.   Objective: Vitals:   10/31/21 1947 10/31/21 2028 11/01/21 0511 11/01/21 1341  BP: (!) 155/67  (!) 124/94 (!) 169/67  Pulse: 61  (!) 57 (!) 58  Resp: '18  16 18  '$ Temp: 98.5 F (36.9 C)  97.9 F (36.6 C) 97.6 F (36.4 C)  TempSrc: Oral   Oral  SpO2: 96% 97% 97% 96%  Weight:      Height:        Intake/Output Summary (Last 24 hours) at 11/01/2021 1804 Last data filed at 11/01/2021 0620 Gross per 24 hour  Intake 240 ml  Output 500 ml  Net -260 ml   Filed Weights   10/29/21 1658  Weight: 66.2 kg     Exam:  General: Patient in excellent spirits with her attentive son at bedside in NAD Eyes: sclera anicteric, conjuctiva mild  injection bilaterally CVS: S1-S2, regular  Respiratory:  decreased air entry bilaterally secondary to decreased inspiratory effort, rales at bases  GI: NABS, soft, NT  LE: No edema.   Assessment & Plan:   COVID-19 hypoxia Patient has responded well to steroids and is breathing well Discharge patient home on to complete course of steroids. She will complete her remdesivir today. Patient states she is not ready to go home today, would like to go home tomorrow. Inflammatory markers are reassuring.  Back pain MRI with multilevel degenerative disease Discussed with patient's daughter [name] that pain control in an 88 year old female female can be challenging.  She is to discuss with Dr. Haynes Lynch who has suggested possible steroid injections of L-spine.  While patient is on steroids, her pain is reasonably controlled.  Patient to be discharged home to complete steroid course tomorrow.  Leukocytosis Likely secondary to ongoing steroid use.  Atrial fibrillation Rate is controlled off nodal blockers Continue Eliquis  HTN Continue home management, further management per PCP as warranted     Scheduled Meds:  amLODipine  2.5 mg Oral Daily   apixaban  5 mg Oral BID   vitamin C  500 mg Oral Daily   atorvastatin  10 mg Oral QHS   cephALEXin  250 mg Oral Daily   cholecalciferol  2,000 Units Oral QHS   Ipratropium-Albuterol  1 puff Inhalation BID   methylPREDNISolone (SOLU-MEDROL) injection  0.5 mg/kg Intravenous Q12H   Followed by   Derrill Memo ON 11/04/2021] predniSONE  50 mg Oral Daily   mirabegron ER  25 mg Oral Daily   mirtazapine  15 mg Oral QHS   zinc sulfate  220 mg Oral Daily   Continuous Infusions:  Data Reviewed:  Basic Metabolic Panel: Recent Labs  Lab 10/29/21 1720 10/31/21 0555 11/01/21 0637  NA 140 142 141  K 4.3 4.3 4.0  CL 98 104 106  CO2 33* 31 24  GLUCOSE 102* 157* 174*  BUN 20 25* 25*  CREATININE 0.94 0.71 0.74  CALCIUM 8.7* 8.7* 8.6*  MG  --  2.6* 2.6*  PHOS   --  2.9 3.3    CBC: Recent Labs  Lab 10/29/21 1720 10/31/21 0555 11/01/21 0637  WBC 14.7* 12.9* 14.5*  NEUTROABS 12.1* 10.8* 13.1*  HGB 15.0 14.7 14.8  HCT 45.0 44.2 46.2*  MCV 100.0 99.3 103.6*  PLT 202 200 214    Studies: DG CHEST PORT 1 VIEW  Result Date: 11/01/2021 CLINICAL DATA:  COVID, dyspnea EXAM: PORTABLE CHEST 1 VIEW COMPARISON:  Prior chest x-ray 10/29/2021 FINDINGS: Stable cardiac and mediastinal contours. Atherosclerotic calcifications again noted in the transverse aorta. Similar appearance of hazy airspace opacity overlying the left lung base. The remainder of the lungs are clear. No pneumothorax. Stable chronic bronchitic changes. IMPRESSION: Stable appearance of the chest with probable small layering left pleural effusion and associated left basilar atelectasis versus infiltrate. Electronically Signed   By: [name] M.D.   On: 11/01/2021 07:29    Principal Problem:   COVID-19 virus infection Active Problems:   Atrial fibrillation ([place])   Acute respiratory failure with hypoxia ([facility])   Essential hypertension   Hyperlipidemia LDL goal <70   Emphysema of lung ([place])   Aortic atherosclerosis ([place])   Chronic UTI   Obstructive sleep apnea     [name], [facility]  If 7PM-7AM, please contact night-coverage [email or web address]   LOS: 1 day

## 2021-11-02 DIAGNOSIS — J9601 Acute respiratory failure with hypoxia: Secondary | ICD-10-CM | POA: Diagnosis not present

## 2021-11-02 DIAGNOSIS — E8809 Other disorders of plasma-protein metabolism, not elsewhere classified: Secondary | ICD-10-CM

## 2021-11-02 DIAGNOSIS — R799 Abnormal finding of blood chemistry, unspecified: Secondary | ICD-10-CM

## 2021-11-02 DIAGNOSIS — D72829 Elevated white blood cell count, unspecified: Secondary | ICD-10-CM

## 2021-11-02 DIAGNOSIS — R7989 Other specified abnormal findings of blood chemistry: Secondary | ICD-10-CM | POA: Diagnosis not present

## 2021-11-02 DIAGNOSIS — I700 Atherosclerosis of aorta: Secondary | ICD-10-CM | POA: Diagnosis not present

## 2021-11-02 DIAGNOSIS — U071 COVID-19: Secondary | ICD-10-CM | POA: Diagnosis not present

## 2021-11-02 LAB — COMPREHENSIVE METABOLIC PANEL
ALT: 186 U/L — ABNORMAL HIGH (ref 0–44)
AST: 82 U/L — ABNORMAL HIGH (ref 15–41)
Albumin: 3.2 g/dL — ABNORMAL LOW (ref 3.5–5.0)
Alkaline Phosphatase: 45 U/L (ref 38–126)
Anion gap: 6 (ref 5–15)
BUN: 31 mg/dL — ABNORMAL HIGH (ref 8–23)
CO2: 27 mmol/L (ref 22–32)
Calcium: 8.4 mg/dL — ABNORMAL LOW (ref 8.9–10.3)
Chloride: 109 mmol/L (ref 98–111)
Creatinine, Ser: 0.73 mg/dL (ref 0.44–1.00)
GFR, Estimated: >60 mL/min (ref >60)
Glucose, Bld: 182 mg/dL — ABNORMAL HIGH (ref 70–99)
Potassium: 4.3 mmol/L (ref 3.5–5.1)
Sodium: 142 mmol/L (ref 135–145)
Total Bilirubin: 0.7 mg/dL (ref 0.3–1.2)
Total Protein: 5.8 g/dL — ABNORMAL LOW (ref 6.5–8.1)

## 2021-11-02 LAB — CBC WITH DIFFERENTIAL/PLATELET
Abs Immature Granulocytes: 0.29 10*3/uL — ABNORMAL HIGH (ref 0.00–0.07)
Basophils Absolute: 0 10*3/uL (ref 0.0–0.1)
Basophils Relative: 0 %
Eosinophils Absolute: 0 10*3/uL (ref 0.0–0.5)
Eosinophils Relative: 0 %
HCT: 43.3 % (ref 36.0–46.0)
Hemoglobin: 14.1 g/dL (ref 12.0–15.0)
Immature Granulocytes: 2 %
Lymphocytes Relative: 6 %
Lymphs Abs: 0.9 10*3/uL (ref 0.7–4.0)
MCH: 32.9 pg (ref 26.0–34.0)
MCHC: 32.6 g/dL (ref 30.0–36.0)
MCV: 101.2 fL — ABNORMAL HIGH (ref 80.0–100.0)
Monocytes Absolute: 0.7 10*3/uL (ref 0.1–1.0)
Monocytes Relative: 5 %
Neutro Abs: 13.4 10*3/uL — ABNORMAL HIGH (ref 1.7–7.7)
Neutrophils Relative %: 87 %
Platelets: 220 10*3/uL (ref 150–400)
RBC: 4.28 MIL/uL (ref 3.87–5.11)
RDW: 12.2 % (ref 11.5–15.5)
WBC: 15.3 10*3/uL — ABNORMAL HIGH (ref 4.0–10.5)
nRBC: 0 % (ref 0.0–0.2)

## 2021-11-02 LAB — FERRITIN: Ferritin: 187 ng/mL (ref 11–307)

## 2021-11-02 LAB — MAGNESIUM: Magnesium: 2.4 mg/dL (ref 1.7–2.4)

## 2021-11-02 LAB — PHOSPHORUS: Phosphorus: 3.5 mg/dL (ref 2.5–4.6)

## 2021-11-02 LAB — D-DIMER, QUANTITATIVE: D-Dimer, Quant: 0.33 ug/mL-FEU (ref 0.00–0.50)

## 2021-11-02 LAB — C-REACTIVE PROTEIN: CRP: 1.1 mg/dL — ABNORMAL HIGH (ref <1.0)

## 2021-11-02 MED ORDER — ASCORBIC ACID 500 MG PO TABS: 500.0000 mg | ORAL_TABLET | Freq: Every day | ORAL | 0 refills | Status: DC

## 2021-11-02 MED ORDER — OXYCODONE HCL 5 MG PO CAPS: 5.0000 mg | ORAL_CAPSULE | ORAL | 0 refills | Status: DC | PRN

## 2021-11-02 MED ORDER — ONDANSETRON HCL 4 MG PO TABS: 4.0000 mg | ORAL_TABLET | Freq: Four times a day (QID) | ORAL | 0 refills | Status: DC | PRN

## 2021-11-02 MED ORDER — GUAIFENESIN-DM 100-10 MG/5ML PO SYRP: 10.0000 mL | ORAL_SOLUTION | ORAL | 0 refills | Status: DC | PRN

## 2021-11-02 MED ORDER — IPRATROPIUM-ALBUTEROL 20-100 MCG/ACT IN AERS: 1.0000 | INHALATION_SPRAY | Freq: Four times a day (QID) | RESPIRATORY_TRACT | 0 refills | Status: DC | PRN

## 2021-11-02 MED ORDER — ZINC SULFATE 220 (50 ZN) MG PO CAPS: 220.0000 mg | ORAL_CAPSULE | Freq: Every day | ORAL | 0 refills | Status: DC

## 2021-11-02 MED ORDER — PREDNISONE 10 MG (21) PO TBPK: ORAL_TABLET | ORAL | 0 refills | Status: DC

## 2021-11-02 MED ORDER — ATORVASTATIN CALCIUM 10 MG PO TABS: 10.0000 mg | ORAL_TABLET | Freq: Every day | ORAL | 3 refills | Status: DC

## 2021-11-02 NOTE — Discharge Summary (Signed)
Physician Discharge Summary   Patient: [name] MRN: [id number] DOB: [birth year]  Admit date:     10/29/2021  Discharge date: 11/02/21  Discharge Physician: Raiford Noble, DO   PCP: Crist Infante, MD   Recommendations at discharge:   Follow-up with PCP within 1 to 2 weeks and repeat CBC, CMP, mag, Phos within 1 week Follow-up with neurosurgery outpatient setting for epidural steroid injections if necessary and further work-up of back pain Follow-up LFTs in the outpatient setting obtain right upper quadrant ultrasound as well as an acute hepatitis panel if LFTs are still elevated on repeat within 1 week; this was discussed with the patient and family and they understand agree with plan of care  Discharge Diagnoses: Principal Problem:   COVID-19 virus infection Active Problems:   Atrial fibrillation ([place])   Acute respiratory failure with hypoxia ([place])   Essential hypertension   Hyperlipidemia LDL goal <70   Emphysema of lung (HCC)   Aortic atherosclerosis (HCC)   Chronic UTI   Obstructive sleep apnea   Hypoalbuminemia   Abnormal LFTs   Elevated BUN   Leukocytosis  Resolved Problems:   * No resolved hospital problems. [facility] Course: HPI per Dr. Tennis Must on 10/30/21 [name] is a 88 y.o. 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  She has had a mild cough and sore throat.  She has received 3 doses of COVID vaccine. He denied fever, chills, rhinorrhea, sore throat, wheezing or  hemoptysis.  No chest pain, palpitations, diaphoresis, PND, orthopnea or pitting edema of the lower extremities.  No abdominal pain, nausea, emesis, diarrhea, constipation, melena or hematochezia.  No flank pain, dysuria, frequency or hematuria.  No polyuria, polydipsia, polyphagia or blurred vision.  She has been hypersomnolent after having the CVA.   ED course: Initial vital signs were temperature 98.7 F, pulse 73, respiration 18, BP 161/79 mmHg O2 sat 96% on room air.  The patient received dexamethasone 10 mg IVP, oxycodone 5 mg p.o. and remdesivir 100 mg IVPB.   Lab work: Her urinalysis was positive.  Coronavirus PCR was positive and influenza A/B negative.  CBC is her white count 14.7, hemoglobin 15.0 g/dL platelets 202.  BMP showed a CO2 of 33 mmol/L, glucose of 102 and calcium of 8.7 mg/deciliter.  The rest of the electrolytes, BUN and creatinine were normal.   Imaging: Portable 1 view chest radiograph with borderline cardiomegaly, aortic atherosclerosis, but no acute cardiopulmonary disease.  CT head with no acute intracranial normality, but showed chronic atrophy and ischemic changes.  CT abdomen/pelvis with no acute intra-abdominal pathology.  Right hip x-ray without fracture or dislocation   **Interim History Patient's back pain is improving and she has been weaned off of oxygen from her COVID.  She completed her remdesivir and was changed to oral steroids and did well.  PT OT recommending home health and will follow-up in outpatient setting.    Assessment and Plan: No notes have been filed under this hospital service. Service: Hospitalist  Acute respiratory failure with hypoxia ([place]) with superimposed emphysema of the lung in the setting of  acute COVID virus 19 -Admit to telemetry/inpatient. -Supplemental oxygen as needed. -Bronchodilators scheduled with Combivent 1 puff IH every 6 and Xopenex inhaler 2 puffs IH every 8 as needed for wheezing or shortness of breath -Incentive  spirometry while awake. -Flutter valve exercises. -Antitussives as needed with Robitussin-DM. -Vitamin C/zinc supplementation -Dexamethasone 6 mg IVP daily changed to Solu-Medrol. -Continue remdesivir per pharmacy and she has 1 more dose left tomorrow. -Check inflammatory marker trend -COVID-19 Labs  Recent Labs    11/01/21 0637 11/02/21 0519  DDIMER <0.27 0.33  FERRITIN 186  181 187  LDH 256*  --   CRP 2.0*  1.9* 1.1*    Lab Results  Component Value Date   SARSCOV2NAA POSITIVE (A) 10/29/2021   [place] NEGATIVE 01/20/2020   [place] NEGATIVE 12/20/2019   [place] NEGATIVE 11/13/2019    -Airborne and contact precautions -Follow-up CBC, CMP and chemistry. -She will need an ambulatory home O2 screen prior to discharge and repeat chest x-ray in the a.m. given that she continues to have a cough; she did not desaturate and did well and will need a repeat chest x-ray in 3 to 6 weeks   Atrial fibrillation ([place]) -Rate is currently controlled. -Continue apixaban. -Continue to monitor on telemetry while hospitalized   Essential hypertension -Continue amlodipine 2.5 mg p.o. daily. -An extra 2.5 mg dose was given due to SBP in the 170s. -Continue to monitor blood pressure and heart rate per protocol. -Last blood pressure reading was 177/82    Hyperlipidemia LDL goal <70 -Continue atorvastatin 10 mg p.o. daily.   Aortic atherosclerosis (HCC) -Continue statin as above.   Chronic UTI Continue daily cephalexin 250 mg p.o. daily for prophylaxis.   Obstructive sleep apnea Continue CPAP at bedtime.   Back pain -This was her presenting complaint -Recently placed on prednisone in outpatient setting by PCP and improved her back pain but worsened after prednisone stopped -MRI done and showed "Multilevel degenerative change with mild foraminal stenosis on the left at L4-L5." -Continue with analgesics and pain control and continue with oxycodone-acetaminophen 1 tab p.o.  every 6 as needed for moderate-severe pain -Was taking tramadol in the outpatient setting but did not help -Continue with PT and OT and OT is recommending no follow-up -If back pain persists can follow-up with neurosurgery in the outpatient setting but they are unlikely to do anything given that she has mild stenosis   Hypoalbuminemia -Patient's albumin level went from 3.8 and trended down to 3.2 -Continue monitor trend and repeat CMP in outpatient setting  Abnormal LFTs -Mild and likely reactive -Patient's AST was 37 ALT is now 101 on the first day that she was here and then trended up to 82/1.86 -Continue to monitor and trend and repeat CMP in outpatient setting and if necessary will obtain a right upper quadrant ultrasound as well as an acute hepatitis panel if still elevated on repeat and defer to PCP to do this.   Elevated BUN -In setting of steroid demargination -Patient's BUN is now 31 in the setting of steroids -No signs or symptoms of acute upper GI bleeding -Repeat CBC in a.m.   Leukocytosis, trending up in the setting of steroids -Mild and likely reactive and trended up 15.3 -Continue to monitor for signs and symptoms infection; no infection noted Repeat CBC in a.m.   Consultants: None Procedures performed: As above  Disposition: Home health Diet recommendation:  Discharge Diet Orders (From admission, onward)     Start     Ordered  11/02/21 0000  Diet - low sodium heart healthy        11/02/21 1201           Cardiac diet DISCHARGE MEDICATION: Allergies as of 11/02/2021       Reactions   Bee Venom Anaphylaxis   Mixed Vespid Venom Anaphylaxis   Codeine Other (See Comments)   Not known   Sulfa Antibiotics Itching, Nausea Only   Erythromycin Nausea And Vomiting        Medication List     TAKE these medications    amLODipine 2.5 MG tablet Commonly known as: NORVASC Take 1 tablet (2.5 mg total) by mouth daily.   apixaban 5 MG Tabs tablet Commonly  known as: Eliquis Take 1 tablet (5 mg total) by mouth 2 (two) times daily.   ascorbic acid 500 MG tablet Commonly known as: VITAMIN C Take 1 tablet (500 mg total) by mouth daily.   atorvastatin 10 MG tablet Commonly known as: LIPITOR Take 1 tablet (10 mg total) by mouth at bedtime. Hold for a week until repeat LFTs are obtained and imprvoing Start taking on: November 09, 2021 What changed:  additional instructions These instructions start on November 09, 2021. If you are unsure what to do until then, ask your doctor or other care provider.   cephALEXin 250 MG capsule Commonly known as: KEFLEX Take 250 mg by mouth daily.   Gemtesa 75 MG Tabs Generic drug: Vibegron Take 1 tablet by mouth at bedtime.   guaiFENesin-dextromethorphan 100-10 MG/5ML syrup Commonly known as: ROBITUSSIN DM Take 10 mLs by mouth every 4 (four) hours as needed for cough.   Ipratropium-Albuterol 20-100 MCG/ACT Aers respimat Commonly known as: COMBIVENT Inhale 1 puff into the lungs every 6 (six) hours as needed for wheezing or shortness of breath.   mirtazapine 15 MG tablet Commonly known as: REMERON Take 15 mg by mouth at bedtime.   ondansetron 4 MG tablet Commonly known as: ZOFRAN Take 1 tablet (4 mg total) by mouth every 6 (six) hours as needed for nausea.   oxycodone 5 MG capsule Commonly known as: OXY-IR Take 1 capsule (5 mg total) by mouth every 4 (four) hours as needed.   predniSONE 10 MG (21) Tbpk tablet Commonly known as: STERAPRED UNI-PAK 21 TAB Take 6 tabs on day 1, 5 tablets on day 2, 4 tabs on day 3, 3 tabs on day 4, 2 tabs on day 5, 1 tab on day 6 and then stop and a 7   VITAMIN B12 PO Take 1 tablet by mouth at bedtime.   vitamin D3 50 MCG (2000 UT) Caps Take 2,000 Units by mouth at bedtime.   zinc sulfate 220 (50 Zn) MG capsule Take 1 capsule (220 mg total) by mouth daily.        Follow-up Information     Crist Infante, MD. Call.   Specialty: Internal Medicine Why: Follow  up within 1 week Contact information: [place] [address] [phone]         Jerline Pain, MD .   Specialty: Cardiology Contact information: [phone] [place] [address] [phone]         Judith Part, MD Follow up.   Specialty: Neurosurgery Why: Follow up for Lumbar Back Pain for consideration of ESI Contact information: [place] [address] [phone]                Discharge Exam: Danley Danker Weights  10/29/21 1658  Weight: 66.2 kg   Vitals:   11/02/21 0101 11/02/21 0546  BP:  (!) 177/82  Pulse:  63  Resp: 17 18  Temp:  97.6 F (36.4 C)  SpO2:  95%   Examination: Physical Exam:  Constitutional: WN/WD overweight elderly Caucasian female currently no acute distress Respiratory: Diminished to auscultation bilaterally, no wheezing, rales, rhonchi or crackles. Normal respiratory effort and patient is not tachypenic. No accessory muscle use.  She does have a cough but is nonproductive Cardiovascular: RRR, no murmurs / rubs / gallops. S1 and S2 auscultated.  Trace edema Abdomen: Soft, non-tender, distended secondary body habitus. Bowel sounds positive.  GU: Deferred. Musculoskeletal: No clubbing / cyanosis of digits/nails. No joint deformity upper and lower extremities.  Skin: No rashes, lesions, ulcers on limited skin evaluation. No induration; Warm and dry.  Neurologic: CN 2-12 grossly intact with no focal deficits. Romberg sign cerebellar reflexes not assessed.  Psychiatric: Normal judgment and insight. Alert and oriented x 3. Normal mood and appropriate affect.   Condition at discharge: stable  The results of significant diagnostics from this hospitalization (including imaging, microbiology, ancillary and laboratory) are listed below for reference.   Imaging Studies: DG CHEST PORT 1 VIEW  Result Date: 11/01/2021 CLINICAL DATA:  COVID, dyspnea EXAM: PORTABLE CHEST 1 VIEW  COMPARISON:  Prior chest x-ray 10/29/2021 FINDINGS: Stable cardiac and mediastinal contours. Atherosclerotic calcifications again noted in the transverse aorta. Similar appearance of hazy airspace opacity overlying the left lung base. The remainder of the lungs are clear. No pneumothorax. Stable chronic bronchitic changes. IMPRESSION: Stable appearance of the chest with probable small layering left pleural effusion and associated left basilar atelectasis versus infiltrate. Electronically Signed   By: Jacqulynn Cadet M.D.   On: 11/01/2021 07:29   CT Head Wo Contrast  Result Date: 10/29/2021 CLINICAL DATA:  Altered mental status EXAM: CT HEAD WITHOUT CONTRAST TECHNIQUE: Contiguous axial images were obtained from the base of the skull through the vertex without intravenous contrast. RADIATION DOSE REDUCTION: This exam was performed according to the departmental dose-optimization program which includes automated exposure control, adjustment of the mA and/or kV according to patient size and/or use of iterative reconstruction technique. COMPARISON:  06/09/2020 FINDINGS: Brain: No evidence of acute infarction, hemorrhage, hydrocephalus, extra-axial collection or mass lesion/mass effect. Mild atrophic and chronic white matter ischemic changes are noted. Vascular: No hyperdense vessel or unexpected calcification. Skull: Normal. Negative for fracture or focal lesion. Sinuses/Orbits: No acute finding. Other: None. IMPRESSION: Chronic atrophic and ischemic changes without acute abnormality. Electronically Signed   By: Inez Catalina M.D.   On: 10/29/2021 21:13   CT Abdomen Pelvis W Contrast  Result Date: 10/29/2021 CLINICAL DATA:  Sepsis EXAM: CT ABDOMEN AND PELVIS WITH CONTRAST TECHNIQUE: Multidetector CT imaging of the abdomen and pelvis was performed using the standard protocol following bolus administration of intravenous contrast. RADIATION DOSE REDUCTION: This exam was performed according to the departmental  dose-optimization program which includes automated exposure control, adjustment of the mA and/or kV according to patient size and/or use of iterative reconstruction technique. CONTRAST:  52m OMNIPAQUE IOHEXOL 300 MG/ML  SOLN COMPARISON:  12/20/2019 FINDINGS: Lower chest: No acute abnormality.  Small hiatal hernia Hepatobiliary: Tiny probable cyst within the right hepatic dome. Liver otherwise unremarkable. Gallbladder unremarkable. No intra or extrahepatic biliary ductal dilation. Pancreas: Unremarkable Spleen: Unremarkable Adrenals/Urinary Tract: Adrenal glands are unremarkable. Kidneys are normal, without renal calculi, focal lesion, or hydronephrosis. Bladder is unremarkable. Stomach/Bowel: Moderate sigmoid diverticulosis. The stomach, small  bowel, and large bowel are otherwise unremarkable. No evidence of obstruction or focal inflammation. Appendix normal. No free intraperitoneal gas or fluid. Vascular/Lymphatic: Aortic atherosclerosis. No enlarged abdominal or pelvic lymph nodes. Reproductive: Status post hysterectomy. No adnexal masses. Other: No abdominal wall hernia or abnormality. No abdominopelvic ascites. Musculoskeletal: Osseous structures are age-appropriate. No acute bone abnormality. No lytic or blastic bone lesion. IMPRESSION: 1. No acute intra-abdominal pathology identified. No definite radiographic explanation for the patient's reported sepsis. 2. Small hiatal hernia. 3. Moderate sigmoid diverticulosis without superimposed acute inflammatory change. Aortic Atherosclerosis (ICD10-I70.0). Electronically Signed   By: Fidela Salisbury M.D.   On: 10/29/2021 20:58   DG Chest 1 View  Result Date: 10/29/2021 CLINICAL DATA:  Pt BIBA from home. Pt c/o back and shoulder pain- has had MRI- was dx with sciatica. Pt has been taking prednisone and tramadol with minimal releif, but has been able to perform ADLs. EXAM: CHEST  1 VIEW COMPARISON:  06/09/2020 FINDINGS: Lungs are clear. Borderline cardiomegaly.   Aortic Atherosclerosis (ICD10-170.0). Chronic blunting of the left lateral costophrenic angle. No pneumothorax. Visualized bones unremarkable. IMPRESSION: Stable chronic findings.  No acute cardiopulmonary disease. Electronically Signed   By: Lucrezia Europe M.D.   On: 10/29/2021 20:56   DG Hip Unilat With Pelvis 2-3 Views Right  Result Date: 10/29/2021 CLINICAL DATA:  Hip pain. EXAM: DG HIP (WITH OR WITHOUT PELVIS) 2-3V RIGHT COMPARISON:  CT abdomen and pelvis 12/20/2019 FINDINGS: There is no evidence of hip fracture or dislocation. Joint spaces are maintained. There are mild degenerative changes of both hips with acetabular sclerosis and osteophyte formation. There also degenerative changes of the lower lumbar spine. Soft tissues are within normal limits. IMPRESSION: 1. No acute fracture or dislocation. 2. Mild degenerative changes of both hips. Electronically Signed   By: Ronney Asters M.D.   On: 10/29/2021 18:13   MR LUMBAR SPINE WO CONTRAST  Result Date: 10/23/2021 CLINICAL DATA:  Sciatica. EXAM: MRI LUMBAR SPINE WITHOUT CONTRAST TECHNIQUE: Multiplanar, multisequence MR imaging of the lumbar spine was performed. No intravenous contrast was administered. COMPARISON:  None Available. FINDINGS: Segmentation:  Standard Alignment:  No substantial sagittal subluxation. Vertebrae: T12-L1 and L2-L3 degenerative/discogenic endplate signal changes. Conus medullaris and cauda equina: Conus extends to the L1-L2 level. Conus appears normal. Paraspinal and other soft tissues: Unremarkable. Disc levels: T12-L1: Slight disc bulging and facet arthropathy. No significant stenosis. L1-L2: Mild disc bulging and facet arthropathy. No significant stenosis. L2-L3: Mild disc bulging and facet arthropathy. No significant stenosis. L3-L4: Loss bulging and facet arthropathy.  No significant stenosis. L4-L5: Disc bulging and endplate spurring with bilateral facet arthropathy. Resulting mild left foraminal stenosis without significant  canal or right foraminal stenosis. L5-S1: Disc bulging and facet arthropathy without significant stenosis. IMPRESSION: Multilevel degenerative change with mild foraminal stenosis on the left at L4-L5. Electronically Signed   By: Margaretha Sheffield M.D.   On: 10/23/2021 14:57    Microbiology: Results for orders placed or performed during the hospital encounter of 10/29/21  Resp Panel by RT-PCR (Flu A&B, Covid) Anterior Nasal Swab     Status: Abnormal   Collection Time: 10/29/21 10:28 PM   Specimen: Anterior Nasal Swab  Result Value Ref Range Status   SARS Coronavirus 2 by RT PCR POSITIVE (A) NEGATIVE Final    Comment: (NOTE) SARS-CoV-2 target nucleic acids are DETECTED.  The SARS-CoV-2 RNA is generally detectable in upper respiratory specimens during the acute phase of infection. Positive results are indicative of the presence of the identified  virus, but do not rule out bacterial infection or co-infection with other pathogens not detected by the test. Clinical correlation with patient history and other diagnostic information is necessary to determine patient infection status. The expected result is Negative.  Fact Sheet for Patients: EntrepreneurPulse.com.au  Fact Sheet for Healthcare Providers: IncredibleEmployment.be  This test is not yet approved or cleared by the [place] FDA and  has been authorized for detection and/or diagnosis of SARS-CoV-2 by FDA under an Emergency Use Authorization (EUA).  This EUA will remain in effect (meaning this test can be used) for the duration of  the COVID-19 declaration under Section 564(b)(1) of the A ct, 21 U.S.C. section 360bbb-3(b)(1), unless the authorization is terminated or revoked sooner.     Influenza A by PCR NEGATIVE NEGATIVE Final   Influenza B by PCR NEGATIVE NEGATIVE Final    Comment: (NOTE) The Xpert Xpress SARS-CoV-2/FLU/RSV plus assay is intended as an aid in the diagnosis of influenza  from Nasopharyngeal swab specimens and should not be used as a sole basis for treatment. Nasal washings and aspirates are unacceptable for Xpert Xpress SARS-CoV-2/FLU/RSV testing.  Fact Sheet for Patients: EntrepreneurPulse.com.au  Fact Sheet for Healthcare Providers: IncredibleEmployment.be  This test is not yet approved or cleared by the [place] FDA and has been authorized for detection and/or diagnosis of SARS-CoV-2 by FDA under an Emergency Use Authorization (EUA). This EUA will remain in effect (meaning this test can be used) for the duration of the COVID-19 declaration under Section 564(b)(1) of the Act, 21 U.S.C. section 360bbb-3(b)(1), unless the authorization is terminated or revoked.  Performed at [facility], [address] [place], [place] [address]     Labs: CBC: Recent Labs  Lab 10/29/21 1720 10/31/21 0555 11/01/21 0637 11/02/21 0519  WBC 14.7* 12.9* 14.5* 15.3*  NEUTROABS 12.1* 10.8* 13.1* 13.4*  HGB 15.0 14.7 14.8 14.1  HCT 45.0 44.2 46.2* 43.3  MCV 100.0 99.3 103.6* 101.2*  PLT 202 200 214 094   Basic Metabolic Panel: Recent Labs  Lab 10/29/21 1720 10/31/21 0555 11/01/21 0637 11/02/21 0519  NA 140 142 141 142  K 4.3 4.3 4.0 4.3  CL 98 104 106 109  CO2 33* '31 24 27  '$ GLUCOSE 102* 157* 174* 182*  BUN 20 25* 25* 31*  CREATININE 0.94 0.71 0.74 0.73  CALCIUM 8.7* 8.7* 8.6* 8.4*  MG  --  2.6* 2.6* 2.4  PHOS  --  2.9 3.3 3.5   Liver Function Tests: Recent Labs  Lab 10/31/21 0555 11/01/21 0637 11/02/21 0519  AST 37 55* 82*  ALT 101* 128* 186*  ALKPHOS 50 48 45  BILITOT 0.8 0.8 0.7  PROT 6.5 6.2* 5.8*  ALBUMIN 3.8 3.5 3.2*   CBG: No results for input(s): "GLUCAP" in the last 168 hours.  Discharge time spent: greater than 30 minutes.  Signed: Raiford Noble, DO [facility] 11/03/2021

## 2021-11-02 NOTE — TOC Initial Note (Addendum)
Transition of Care [facility]) - Initial/Assessment Note    Patient Details  Name: [name] MRN: [id number] Date of Birth: May 27, 1931  Transition of Care [facility]) CM/SW Contact:    Vassie Moselle, LCSW Phone Number: 11/02/2021, 12:14 PM  Clinical Narrative:                 Spoke with pt over the phone and confirmed plan to OPPT. Pt gave verbal permission to speak with her PCP and to schedule appt as pt's PCP. Pt's PCP will need to sign order for OPPT referral. CSW sent secure chat to pt's PCP and called and left voicemail with RN to schedule PCP appt. CSW completed order for OPPT that will need MD signature.   Update 12:50pm: CSW received call from Heath at [facility] who shared that this pt is active with their home health services. Pt is receiving home health PT and will need order placed for these services to continue. MD notified.     Expected Discharge Plan: OP Rehab Barriers to Discharge: No Barriers Identified   Patient Goals and CMS Choice Patient states their goals for this hospitalization and ongoing recovery are:: To go home CMS Medicare.gov Compare Post Acute Care list provided to:: Patient Choice offered to / list presented to : Patient  Expected Discharge Plan and Services Expected Discharge Plan: OP Rehab In-house Referral: NA Discharge Planning Services: NA Post Acute Care Choice: NA Living arrangements for the past 2 months: Single Family Home Expected Discharge Date: 11/02/21               DME Arranged: N/A DME Agency: NA                  Prior Living Arrangements/Services Living arrangements for the past 2 months: Single Family Home Lives with:: Self Patient language and need for interpreter reviewed:: Yes Do you feel safe going back to the place where you live?: Yes      Need for Family Participation in Patient Care: No (Comment) Care giver support system in place?: No (comment) Current home services: DME Criminal Activity/Legal Involvement  Pertinent to Current Situation/Hospitalization: No - Comment as needed  Activities of Daily Living Home Assistive Devices/Equipment: None ADL Screening (condition at time of admission) Patient's cognitive ability adequate to safely complete daily activities?: Yes Is the patient deaf or have difficulty hearing?: No Does the patient have difficulty seeing, even when wearing glasses/contacts?: No Does the patient have difficulty concentrating, remembering, or making decisions?: No Patient able to express need for assistance with ADLs?: Yes Does the patient have difficulty dressing or bathing?: No Independently performs ADLs?: Yes (appropriate for developmental age) Does the patient have difficulty walking or climbing stairs?: No Weakness of Legs: None Weakness of Arms/Hands: None  Permission Sought/Granted Permission sought to share information with : PCP Permission granted to share information with : Yes, Verbal Permission Granted     Permission granted to share info w AGENCY: [facility]        Emotional Assessment Appearance:: Other (Comment Required (Unable to assess. Spoke with pt over the phone) Attitude/Demeanor/Rapport: Unable to Assess Affect (typically observed): Unable to Assess Orientation: : Oriented to Self, Oriented to Place, Oriented to  Time, Oriented to Situation Alcohol / Substance Use: Not Applicable Psych Involvement: No (comment)  Admission diagnosis:  Sciatica of right side [M54.31] Acute respiratory failure with hypoxia ([place]) [J96.01] COVID-19 virus infection [U07.1] COVID-19 [U07.1] Patient Active Problem List   Diagnosis Date Noted  COVID-19 virus infection 10/30/2021   Tachycardia-bradycardia syndrome ([place]) 05/19/2021   Obstructive sleep apnea 05/19/2021   Pseudoaneurysm of femoral artery following procedure ([place]) 11/23/2019   CAP (community acquired pneumonia) 11/23/2019   Essential hypertension 11/23/2019   Hyperlipidemia LDL goal  <70 11/23/2019   Emphysema of lung ([place]) 11/23/2019   Aortic atherosclerosis ([place]) 11/23/2019   Acute blood loss anemia 11/23/2019   Acute ischemic stroke (HCC)    Pseudoaneurysm (HCC)    Slow transit constipation    Chronic UTI    Acute ischemic right PCA stroke ([place]) s/p tPA and mechanical thrombectomy, d/t AF not on [facility] 11/14/2019   Acute respiratory failure with hypoxia ([place])    History of stroke 11/13/2019   Degeneration of lumbar intervertebral disc 02/01/2019   Impacted cerumen of both ears 07/25/2015   Conductive hearing loss of both ears 07/25/2015   Hymenoptera allergy 10/05/2014   SVT (supraventricular tachycardia) 10/20/2012   Atrial fibrillation ([place])    Atrial flutter ([place])    PCP:  Crist Infante, MD Pharmacy:   [facility], [place] [address] Phone: [phone] Fax: [phone]  [facility] [place], [place] [address] Phone: [phone] Fax: [phone]     Social Determinants of Health (SDOH) Interventions    Readmission Risk Interventions    11/02/2021   12:11 PM  Readmission Risk Prevention Plan  Transportation Screening Complete  PCP or Specialist Appt within 5-7 Days Complete  Home Care Screening Complete  Medication Review (RN CM) Complete

## 2021-11-03 DIAGNOSIS — D72829 Elevated white blood cell count, unspecified: Secondary | ICD-10-CM

## 2021-11-03 DIAGNOSIS — E8809 Other disorders of plasma-protein metabolism, not elsewhere classified: Secondary | ICD-10-CM

## 2021-11-03 DIAGNOSIS — R7989 Other specified abnormal findings of blood chemistry: Secondary | ICD-10-CM

## 2021-11-03 DIAGNOSIS — R799 Abnormal finding of blood chemistry, unspecified: Secondary | ICD-10-CM

## 2021-11-06 DIAGNOSIS — M5416 Radiculopathy, lumbar region: Secondary | ICD-10-CM | POA: Diagnosis not present

## 2021-11-06 DIAGNOSIS — D6869 Other thrombophilia: Secondary | ICD-10-CM | POA: Diagnosis not present

## 2021-11-06 DIAGNOSIS — M5430 Sciatica, unspecified side: Secondary | ICD-10-CM | POA: Diagnosis not present

## 2021-11-06 DIAGNOSIS — D7589 Other specified diseases of blood and blood-forming organs: Secondary | ICD-10-CM | POA: Diagnosis not present

## 2021-11-06 DIAGNOSIS — D692 Other nonthrombocytopenic purpura: Secondary | ICD-10-CM | POA: Diagnosis not present

## 2021-11-06 DIAGNOSIS — I480 Paroxysmal atrial fibrillation: Secondary | ICD-10-CM | POA: Diagnosis not present

## 2021-11-10 ENCOUNTER — Inpatient Hospital Stay (HOSPITAL_COMMUNITY)
Admission: EM | Admit: 2021-11-10 | Discharge: 2021-11-17 | DRG: 177 | Disposition: A | Discharge status: Home Health | Payer: Medicare Other | Attending: Internal Medicine | Admitting: Internal Medicine

## 2021-11-10 ENCOUNTER — Encounter (HOSPITAL_COMMUNITY): Payer: Self-pay | Admitting: Emergency Medicine

## 2021-11-10 ENCOUNTER — Emergency Department (HOSPITAL_COMMUNITY): Payer: Medicare Other

## 2021-11-10 ENCOUNTER — Other Ambulatory Visit: Payer: Self-pay

## 2021-11-10 DIAGNOSIS — J189 Pneumonia, unspecified organism: Secondary | ICD-10-CM | POA: Diagnosis not present

## 2021-11-10 DIAGNOSIS — J180 Bronchopneumonia, unspecified organism: Secondary | ICD-10-CM | POA: Diagnosis not present

## 2021-11-10 DIAGNOSIS — E869 Volume depletion, unspecified: Secondary | ICD-10-CM | POA: Diagnosis present

## 2021-11-10 DIAGNOSIS — I480 Paroxysmal atrial fibrillation: Secondary | ICD-10-CM | POA: Diagnosis not present

## 2021-11-10 DIAGNOSIS — I4892 Unspecified atrial flutter: Secondary | ICD-10-CM | POA: Diagnosis present

## 2021-11-10 DIAGNOSIS — Z8673 Personal history of transient ischemic attack (TIA), and cerebral infarction without residual deficits: Secondary | ICD-10-CM | POA: Diagnosis not present

## 2021-11-10 DIAGNOSIS — A419 Sepsis, unspecified organism: Secondary | ICD-10-CM | POA: Diagnosis not present

## 2021-11-10 DIAGNOSIS — G4733 Obstructive sleep apnea (adult) (pediatric): Secondary | ICD-10-CM | POA: Diagnosis present

## 2021-11-10 DIAGNOSIS — Z800 Family history of malignant neoplasm of digestive organs: Secondary | ICD-10-CM

## 2021-11-10 DIAGNOSIS — U071 COVID-19: Secondary | ICD-10-CM | POA: Diagnosis not present

## 2021-11-10 DIAGNOSIS — Z79899 Other long term (current) drug therapy: Secondary | ICD-10-CM

## 2021-11-10 DIAGNOSIS — G9341 Metabolic encephalopathy: Secondary | ICD-10-CM | POA: Diagnosis not present

## 2021-11-10 DIAGNOSIS — R9431 Abnormal electrocardiogram [ECG] [EKG]: Secondary | ICD-10-CM | POA: Diagnosis not present

## 2021-11-10 DIAGNOSIS — K5730 Diverticulosis of large intestine without perforation or abscess without bleeding: Secondary | ICD-10-CM | POA: Diagnosis not present

## 2021-11-10 DIAGNOSIS — R531 Weakness: Secondary | ICD-10-CM

## 2021-11-10 DIAGNOSIS — Z8249 Family history of ischemic heart disease and other diseases of the circulatory system: Secondary | ICD-10-CM

## 2021-11-10 DIAGNOSIS — E876 Hypokalemia: Secondary | ICD-10-CM

## 2021-11-10 DIAGNOSIS — R0902 Hypoxemia: Secondary | ICD-10-CM | POA: Diagnosis present

## 2021-11-10 DIAGNOSIS — U099 Post covid-19 condition, unspecified: Secondary | ICD-10-CM | POA: Diagnosis not present

## 2021-11-10 DIAGNOSIS — J432 Centrilobular emphysema: Secondary | ICD-10-CM | POA: Diagnosis not present

## 2021-11-10 DIAGNOSIS — E8809 Other disorders of plasma-protein metabolism, not elsewhere classified: Secondary | ICD-10-CM | POA: Diagnosis present

## 2021-11-10 DIAGNOSIS — I517 Cardiomegaly: Secondary | ICD-10-CM | POA: Diagnosis not present

## 2021-11-10 DIAGNOSIS — E785 Hyperlipidemia, unspecified: Secondary | ICD-10-CM | POA: Diagnosis present

## 2021-11-10 DIAGNOSIS — Z885 Allergy status to narcotic agent status: Secondary | ICD-10-CM | POA: Diagnosis not present

## 2021-11-10 DIAGNOSIS — Z881 Allergy status to other antibiotic agents status: Secondary | ICD-10-CM

## 2021-11-10 DIAGNOSIS — N179 Acute kidney failure, unspecified: Secondary | ICD-10-CM | POA: Diagnosis present

## 2021-11-10 DIAGNOSIS — I10 Essential (primary) hypertension: Secondary | ICD-10-CM | POA: Diagnosis present

## 2021-11-10 DIAGNOSIS — R509 Fever, unspecified: Secondary | ICD-10-CM | POA: Diagnosis not present

## 2021-11-10 DIAGNOSIS — E8770 Fluid overload, unspecified: Secondary | ICD-10-CM | POA: Diagnosis not present

## 2021-11-10 DIAGNOSIS — Z8701 Personal history of pneumonia (recurrent): Secondary | ICD-10-CM | POA: Diagnosis not present

## 2021-11-10 DIAGNOSIS — K3189 Other diseases of stomach and duodenum: Secondary | ICD-10-CM | POA: Diagnosis not present

## 2021-11-10 DIAGNOSIS — Z87440 Personal history of urinary (tract) infections: Secondary | ICD-10-CM

## 2021-11-10 DIAGNOSIS — J439 Emphysema, unspecified: Secondary | ICD-10-CM | POA: Diagnosis present

## 2021-11-10 DIAGNOSIS — J431 Panlobular emphysema: Secondary | ICD-10-CM | POA: Diagnosis not present

## 2021-11-10 DIAGNOSIS — M5440 Lumbago with sciatica, unspecified side: Secondary | ICD-10-CM | POA: Diagnosis present

## 2021-11-10 DIAGNOSIS — B952 Enterococcus as the cause of diseases classified elsewhere: Secondary | ICD-10-CM | POA: Diagnosis present

## 2021-11-10 DIAGNOSIS — J849 Interstitial pulmonary disease, unspecified: Secondary | ICD-10-CM | POA: Diagnosis not present

## 2021-11-10 DIAGNOSIS — Z87891 Personal history of nicotine dependence: Secondary | ICD-10-CM

## 2021-11-10 DIAGNOSIS — R7989 Other specified abnormal findings of blood chemistry: Secondary | ICD-10-CM | POA: Diagnosis not present

## 2021-11-10 DIAGNOSIS — R8271 Bacteriuria: Secondary | ICD-10-CM | POA: Diagnosis not present

## 2021-11-10 DIAGNOSIS — R918 Other nonspecific abnormal finding of lung field: Secondary | ICD-10-CM | POA: Diagnosis not present

## 2021-11-10 DIAGNOSIS — N390 Urinary tract infection, site not specified: Secondary | ICD-10-CM | POA: Diagnosis present

## 2021-11-10 DIAGNOSIS — I700 Atherosclerosis of aorta: Secondary | ICD-10-CM | POA: Diagnosis not present

## 2021-11-10 DIAGNOSIS — K6389 Other specified diseases of intestine: Secondary | ICD-10-CM | POA: Diagnosis not present

## 2021-11-10 DIAGNOSIS — Z7901 Long term (current) use of anticoagulants: Secondary | ICD-10-CM

## 2021-11-10 DIAGNOSIS — R0602 Shortness of breath: Secondary | ICD-10-CM | POA: Diagnosis not present

## 2021-11-10 LAB — COMPREHENSIVE METABOLIC PANEL WITH GFR
ALT: 92 U/L — ABNORMAL HIGH (ref 0–44)
AST: 31 U/L (ref 15–41)
Albumin: 3.2 g/dL — ABNORMAL LOW (ref 3.5–5.0)
Alkaline Phosphatase: 48 U/L (ref 38–126)
Anion gap: 7 (ref 5–15)
BUN: 16 mg/dL (ref 8–23)
CO2: 27 mmol/L (ref 22–32)
Calcium: 8.1 mg/dL — ABNORMAL LOW (ref 8.9–10.3)
Chloride: 102 mmol/L (ref 98–111)
Creatinine, Ser: 1.01 mg/dL — ABNORMAL HIGH (ref 0.44–1.00)
GFR, Estimated: 54 mL/min — ABNORMAL LOW (ref >60)
Glucose, Bld: 112 mg/dL — ABNORMAL HIGH (ref 70–99)
Potassium: 4.1 mmol/L (ref 3.5–5.1)
Sodium: 136 mmol/L (ref 135–145)
Total Bilirubin: 1.7 mg/dL — ABNORMAL HIGH (ref 0.3–1.2)
Total Protein: 6.1 g/dL — ABNORMAL LOW (ref 6.5–8.1)

## 2021-11-10 LAB — URINALYSIS, ROUTINE W REFLEX MICROSCOPIC
Bilirubin Urine: NEGATIVE
Glucose, UA: NEGATIVE mg/dL
Ketones, ur: NEGATIVE mg/dL
Nitrite: NEGATIVE
Protein, ur: NEGATIVE mg/dL
Specific Gravity, Urine: 1.005 (ref 1.005–1.030)
pH: 7 (ref 5.0–8.0)

## 2021-11-10 LAB — RAPID URINE DRUG SCREEN, HOSP PERFORMED
Amphetamines: NOT DETECTED
Barbiturates: NOT DETECTED
Benzodiazepines: NOT DETECTED
Cocaine: NOT DETECTED
Opiates: NOT DETECTED
Tetrahydrocannabinol: NOT DETECTED

## 2021-11-10 LAB — CBC WITH DIFFERENTIAL/PLATELET
Abs Immature Granulocytes: 0.18 K/uL — ABNORMAL HIGH (ref 0.00–0.07)
Basophils Absolute: 0 K/uL (ref 0.0–0.1)
Basophils Relative: 0 %
Eosinophils Absolute: 0.2 K/uL (ref 0.0–0.5)
Eosinophils Relative: 1 %
HCT: 48 % — ABNORMAL HIGH (ref 36.0–46.0)
Hemoglobin: 15.9 g/dL — ABNORMAL HIGH (ref 12.0–15.0)
Immature Granulocytes: 2 %
Lymphocytes Relative: 10 %
Lymphs Abs: 1.1 K/uL (ref 0.7–4.0)
MCH: 32.7 pg (ref 26.0–34.0)
MCHC: 33.1 g/dL (ref 30.0–36.0)
MCV: 98.8 fL (ref 80.0–100.0)
Monocytes Absolute: 0.8 K/uL (ref 0.1–1.0)
Monocytes Relative: 7 %
Neutro Abs: 8.9 K/uL — ABNORMAL HIGH (ref 1.7–7.7)
Neutrophils Relative %: 80 %
Platelets: 133 K/uL — ABNORMAL LOW (ref 150–400)
RBC: 4.86 MIL/uL (ref 3.87–5.11)
RDW: 12.6 % (ref 11.5–15.5)
WBC: 11.2 K/uL — ABNORMAL HIGH (ref 4.0–10.5)
nRBC: 0 % (ref 0.0–0.2)

## 2021-11-10 LAB — RESP PANEL BY RT-PCR (FLU A&B, COVID) ARPGX2
Influenza A by PCR: NEGATIVE
Influenza B by PCR: NEGATIVE
SARS Coronavirus 2 by RT PCR: POSITIVE — AB

## 2021-11-10 LAB — TROPONIN I (HIGH SENSITIVITY)
Troponin I (High Sensitivity): 94 ng/L — ABNORMAL HIGH (ref <18)
Troponin I (High Sensitivity): 76 ng/L — ABNORMAL HIGH (ref <18)

## 2021-11-10 MED ORDER — ONDANSETRON HCL 4 MG/2ML IJ SOLN
4.0000 mg | Freq: Four times a day (QID) | INTRAMUSCULAR | Status: DC | PRN
  Administered 2021-11-12: 4 mg via INTRAVENOUS
  Filled 2021-11-10: qty 2

## 2021-11-10 MED ORDER — SODIUM CHLORIDE 0.9 % IV SOLN
1.0000 g | Freq: Once | INTRAVENOUS | Status: AC
  Administered 2021-11-10: 1 g via INTRAVENOUS
  Filled 2021-11-10: qty 10

## 2021-11-10 MED ORDER — SODIUM CHLORIDE 0.9 % IV SOLN: Freq: Once | INTRAVENOUS | Status: AC

## 2021-11-10 MED ORDER — ACETAMINOPHEN 650 MG RE SUPP: 650.0000 mg | Freq: Four times a day (QID) | RECTAL | Status: DC | PRN

## 2021-11-10 MED ORDER — SODIUM CHLORIDE 0.9 % IV BOLUS
500.0000 mL | Freq: Once | INTRAVENOUS | Status: AC
  Administered 2021-11-10: 500 mL via INTRAVENOUS

## 2021-11-10 MED ORDER — APIXABAN 5 MG PO TABS
5.0000 mg | ORAL_TABLET | Freq: Two times a day (BID) | ORAL | Status: DC
  Administered 2021-11-10 – 2021-11-17 (×14): 5 mg via ORAL
  Filled 2021-11-10 (×14): qty 1

## 2021-11-10 MED ORDER — ATORVASTATIN CALCIUM 10 MG PO TABS: 10.0000 mg | ORAL_TABLET | Freq: Every day | ORAL | Status: DC

## 2021-11-10 MED ORDER — OXYCODONE HCL 5 MG PO TABS: 5.0000 mg | ORAL_TABLET | ORAL | Status: DC | PRN

## 2021-11-10 MED ORDER — ONDANSETRON HCL 4 MG PO TABS: 4.0000 mg | ORAL_TABLET | Freq: Four times a day (QID) | ORAL | Status: DC | PRN

## 2021-11-10 MED ORDER — SODIUM CHLORIDE 0.9 % IV SOLN: INTRAVENOUS | Status: DC

## 2021-11-10 MED ORDER — SODIUM CHLORIDE 0.9 % IV SOLN
1.0000 g | INTRAVENOUS | Status: DC
  Administered 2021-11-11 – 2021-11-12 (×2): 1 g via INTRAVENOUS
  Filled 2021-11-10 (×2): qty 10

## 2021-11-10 MED ORDER — ACETAMINOPHEN 325 MG PO TABS
650.0000 mg | ORAL_TABLET | Freq: Four times a day (QID) | ORAL | Status: DC | PRN
  Administered 2021-11-11 – 2021-11-14 (×5): 650 mg via ORAL
  Filled 2021-11-10 (×5): qty 2

## 2021-11-10 MED ORDER — ACETAMINOPHEN 500 MG PO TABS
1000.0000 mg | ORAL_TABLET | Freq: Once | ORAL | Status: AC
  Administered 2021-11-10: 1000 mg via ORAL
  Filled 2021-11-10: qty 2

## 2021-11-10 MED ORDER — IPRATROPIUM-ALBUTEROL 20-100 MCG/ACT IN AERS: 1.0000 | INHALATION_SPRAY | Freq: Four times a day (QID) | RESPIRATORY_TRACT | Status: DC | PRN

## 2021-11-10 NOTE — H&P (Signed)
History and Physical  Patient: [name] MVE:[id number] DOB: [birth year] DOA: 11/10/2021 DOS: the patient was seen and examined on 11/10/2021 Patient coming from: Home  Chief Complaint:  Chief Complaint  Patient presents with   Weakness   HPI: [name] is a 88 y.o. female with PMH significant of PAF/flutter/ablation, OSA, HLD, CVA, HTN, recurrent pneumonia on suppressive therapy for antibiotic. Recently hospitalized for COVID-19 infection and discharged home on prednisone taper. After few days at home patient started having generalized fatigue with worsening mobility and poor p.o. intake. Had some fever and chills as well. Denies any chest pain.  Denies any cough.  Also reports that she has been battling for the last couple of days.  No nausea no vomiting no aspiration. Patient reports burning urination which is not usual for her. Denies any diarrhea.  Denies any swelling in the leg or pain in the leg. She remains compliant with all her medications.  Review of Systems: As mentioned in the history of present illness. All other systems reviewed and are negative. Past Medical History:  Diagnosis Date   Atrial fibrillation [facility])    Prior treatment with amiodarone, held sinus rhythm and drug stopped   Atrial flutter ([place])    Status post ablation prior to  2009   Chest pain    Nuclear, 2004, no scar or ischemia, EF 77%   Ejection fraction    EF 70%, nuclear,  2004  //      Fall    July, 2014   H/O bladder repair surgery    OSA (obstructive sleep apnea)    Right Achilles tendinitis    Sciatica    Past Surgical History:  Procedure Laterality Date   IR CT HEAD LTD  11/14/2019   IR PERCUTANEOUS ART THROMBECTOMY/INFUSION INTRACRANIAL INC DIAG ANGIO  11/14/2019   IR RADIOLOGIST EVAL & MGMT  02/20/2020   IR US GUIDE VASC ACCESS RIGHT  11/14/2019   no surgical hx     RADIOLOGY WITH ANESTHESIA N/A 11/13/2019   Procedure: IR WITH ANESTHESIA;  Surgeon: Luanne Bras, MD;   Location: [facility];  Service: Radiology;  Laterality: N/A;   Social History:  reports that she has quit smoking. Her smoking use included cigarettes. She has a 10.00 pack-year smoking history. She has never used smokeless tobacco. She reports current alcohol use. She reports that she does not use drugs. Allergies  Allergen Reactions   Bee Venom Anaphylaxis   Mixed Vespid Venom Anaphylaxis   Codeine Other (See Comments)    Exact allergic reaction not known   Sulfa Antibiotics Itching and Nausea Only   Erythromycin Nausea And Vomiting   Family History  Problem Relation Age of Onset   Cancer Mother    Cancer Father        pancreatic cancer   Heart disease Brother    Prior to Admission medications   Medication Sig Start Date End Date Taking? Authorizing Provider  amLODipine (NORVASC) 2.5 MG tablet Take 1 tablet (2.5 mg total) by mouth daily. 02/25/21 02/25/22 Yes Weaver, Scott T, PA-C  apixaban (ELIQUIS) 5 MG TABS tablet Take 1 tablet (5 mg total) by mouth 2 (two) times daily. 07/20/21 07/20/22 Yes Jerline Pain, MD  ascorbic acid (VITAMIN C) 500 MG tablet Take 1 tablet (500 mg total) by mouth daily. 11/03/21  Yes Sheikh, Omair Latif, DO  cephALEXin (KEFLEX) 250 MG capsule Take 250 mg by mouth daily. 01/31/20  Yes [provider]  Cholecalciferol (VITAMIN D3) 50 MCG (  2000 UT) CAPS Take 2,000 Units by mouth at bedtime.   Yes [provider]  CRANBERRY PO Take 650 mg by mouth daily.   Yes [provider]  Cyanocobalamin (VITAMIN B12 PO) Take 1 tablet by mouth at bedtime.   Yes [provider]  FIBER ADULT GUMMIES PO Take 2 tablets by mouth daily.   Yes [provider]  GEMTESA 75 MG TABS Take 75 mg by mouth daily.   Yes [provider]  guaiFENesin-dextromethorphan (ROBITUSSIN DM) 100-10 MG/5ML syrup Take 10 mLs by mouth every 4 (four) hours as needed for cough. 11/02/21  Yes Sheikh, Omair Latif, DO  Ipratropium-Albuterol (COMBIVENT) 20-100 MCG/ACT  AERS respimat Inhale 1 puff into the lungs every 6 (six) hours as needed for wheezing or shortness of breath. 11/02/21  Yes Sheikh, Omair Latif, DO  Multiple Vitamins-Minerals (SENIOR VITES PO) Take 1 tablet by mouth daily with breakfast.   Yes [provider]  oxyCODONE (OXY IR/ROXICODONE) 5 MG immediate release tablet Take 5 mg by mouth every 4 (four) hours as needed (for pain).   Yes [provider]  Probiotic Product ([place]) CAPS Take 1 capsule by mouth daily.   Yes [provider]  zinc sulfate 220 (50 Zn) MG capsule Take 1 capsule (220 mg total) by mouth daily. 11/03/21  Yes Sheikh, Omair Latif, DO  atorvastatin (LIPITOR) 10 MG tablet Take 1 tablet (10 mg total) by mouth at bedtime. Hold for a week until repeat LFTs are obtained and imprvoing Patient not taking: Reported on 11/10/2021 11/09/21 11/09/22  Melissa Noble Latif, DO  ondansetron (ZOFRAN) 4 MG tablet Take 1 tablet (4 mg total) by mouth every 6 (six) hours as needed for nausea. 11/02/21   Melissa Noble Latif, DO  oxycodone (OXY-IR) 5 MG capsule Take 1 capsule (5 mg total) by mouth every 4 (four) hours as needed. Patient not taking: Reported on 11/10/2021 11/02/21   Melissa Noble Latif, DO  predniSONE (STERAPRED UNI-PAK 21 TAB) 10 MG (21) TBPK tablet Take 6 tabs on day 1, 5 tablets on day 2, 4 tabs on day 3, 3 tabs on day 4, 2 tabs on day 5, 1 tab on day 6 and then stop and a 7 Patient not taking: Reported on 11/10/2021 11/02/21   Melissa Elbe, DO   Physical Exam: Vitals:   11/10/21 1345 11/10/21 1620 11/10/21 1630 11/10/21 1739  BP: (!) 111/52  111/61 135/67  Pulse: 66  (!) 59 72  Resp: 17  (!) 22 17  Temp:  98.1 F (36.7 C)  97.8 F (36.6 C)  TempSrc:  Oral  Oral  SpO2: 94%  97% 99%  Weight:      Height:       General: Appear in mild distress; no visible Abnormal Neck Mass Or lumps, Conjunctiva normal Cardiovascular: S1 and S2 Present, no Murmur, Respiratory: good respiratory  effort, Bilateral Air entry present and CTA, no Crackles, no wheezes Abdomen: Bowel Sound present, Non tender  Extremities: no Pedal edema Neurology: alert and oriented to time, place, and person Gait not checked due to patient safety concerns   Data Reviewed: I have Reviewed nursing notes, Vitals, and Lab results since pt's last encounter. Pertinent lab results CBC and BMP I have ordered test including CBC and BMP   COVID-19 test CT value 27.6.  Assessment and Plan Acute metabolic encephalopathy And generalized fatigue and weakness. Mentation is back to baseline. No nausea no vomiting Patient appears to be having some  strength back as well. Likely this is all in the setting of a UTI. We will treat with IV antibiotics and monitor response. PT OT consulted.  UTI. History of chronic UTI prevention patient is on chronic suppressive Keflex for her history of recurrent UTI. Follows up with urology outpatient. For now I will continue with IV ceftriaxone. Make be good candidate for fosfomycin therapy before discharge. Follow-up on urine cultures.  Recent hospitalization for COVID-19 infection. Continues to be testing positive for COVID. CT value 27.6. Does not appear to be having any respiratory symptoms for now. For now maintains out of the isolation.  Sciatica. Patient is supposed to see Dr. Kathyrn Sheriff outpatient. Recommend to postpone the appointment for 1 week.  PAF (paroxysmal atrial fibrillation) (HCC) Currently rate controlled. On Eliquis. Unlikely she will receive an injection for her sciatica on therapeutic anticoagulation. For now monitor.  History of stroke No focal deficit. Continue anticoagulation and cholesterol medication.  Essential hypertension Blood pressures soft. Currently holding amlodipine.  Hyperlipidemia LDL goal <70 Hold statin.  Due to mild elevation of LFTs.  Obstructive sleep apnea On CPAP at home. Continue.   Advance Care Planning:    Code Status: Full Code  Consults: None Family Communication: Son at bedside.  Author: Berle Mull, MD 11/10/2021 7:24 PM For on call review [email or web address].

## 2021-11-10 NOTE — ED Provider Notes (Signed)
[facility] DEPT Provider Note   CSN: [id number] Arrival date & time: 11/10/21  9024     History  Chief Complaint  Patient presents with   Weakness    [name] is a 88 y.o. female.  88 year old female with prior medical history detailed below presents for evaluation.  Patient arrives from home with EMS transport.  Patient's family noted decreased appetite and generalized weakness.  Family is concerned about possible recurrent UTI.  Patient noted to be mildly hypoxic on room air with sats in the 88% range.  Patient was placed on 2 L nasal cannula with improvement of pulse ox.  Patient is alert and oriented to person.  She is confused as to why she is at the hospital today.  The history is provided by the patient, medical records, the EMS personnel and a relative.  Weakness      Home Medications Prior to Admission medications   Medication Sig Start Date End Date Taking? Authorizing Provider  amLODipine (NORVASC) 2.5 MG tablet Take 1 tablet (2.5 mg total) by mouth daily. 02/25/21 02/25/22  Richardson Dopp T, PA-C  apixaban (ELIQUIS) 5 MG TABS tablet Take 1 tablet (5 mg total) by mouth 2 (two) times daily. 07/20/21 07/20/22  Jerline Pain, MD  ascorbic acid (VITAMIN C) 500 MG tablet Take 1 tablet (500 mg total) by mouth daily. 11/03/21   Sheikh, Georgina Quint Latif, DO  atorvastatin (LIPITOR) 10 MG tablet Take 1 tablet (10 mg total) by mouth at bedtime. Hold for a week until repeat LFTs are obtained and imprvoing 11/09/21 11/09/22  Sheikh, Omair Latif, DO  cephALEXin (KEFLEX) 250 MG capsule Take 250 mg by mouth daily. 01/31/20   [provider]  Cholecalciferol (VITAMIN D3) 50 MCG (2000 UT) CAPS Take 2,000 Units by mouth at bedtime.    [provider]  Cyanocobalamin (VITAMIN B12 PO) Take 1 tablet by mouth at bedtime.    [provider]  GEMTESA 75 MG TABS Take 1 tablet by mouth at bedtime. 02/02/21   [provider]   guaiFENesin-dextromethorphan (ROBITUSSIN DM) 100-10 MG/5ML syrup Take 10 mLs by mouth every 4 (four) hours as needed for cough. 11/02/21   Sheikh, Omair Latif, DO  Ipratropium-Albuterol (COMBIVENT) 20-100 MCG/ACT AERS respimat Inhale 1 puff into the lungs every 6 (six) hours as needed for wheezing or shortness of breath. 11/02/21   Sheikh, Georgina Quint Latif, DO  mirtazapine (REMERON) 15 MG tablet Take 15 mg by mouth at bedtime.    [provider]  ondansetron (ZOFRAN) 4 MG tablet Take 1 tablet (4 mg total) by mouth every 6 (six) hours as needed for nausea. 11/02/21   Raiford Noble Latif, DO  oxycodone (OXY-IR) 5 MG capsule Take 1 capsule (5 mg total) by mouth every 4 (four) hours as needed. 11/02/21   Sheikh, Omair Latif, DO  predniSONE (STERAPRED UNI-PAK 21 TAB) 10 MG (21) TBPK tablet Take 6 tabs on day 1, 5 tablets on day 2, 4 tabs on day 3, 3 tabs on day 4, 2 tabs on day 5, 1 tab on day 6 and then stop and a 7 11/02/21   Sheikh, [employer] Latif, DO  zinc sulfate 220 (50 Zn) MG capsule Take 1 capsule (220 mg total) by mouth daily. 11/03/21   Raiford Noble Latif, DO      Allergies    Bee venom, Mixed vespid venom, Codeine, Sulfa antibiotics, and Erythromycin    Review of Systems   Review of Systems  Neurological:  Positive for weakness.  All other systems reviewed and are negative.   Physical Exam Updated Vital Signs BP (!) 187/82 (BP Location: Left Arm)   Pulse 83   Temp 99 F (37.2 C) (Oral)   Resp 14   LMP  (LMP Unknown)   SpO2 97%  Physical Exam Vitals and nursing note reviewed.  Constitutional:      General: She is not in acute distress.    Appearance: Normal appearance. She is well-developed.  HENT:     Head: Normocephalic and atraumatic.  Eyes:     Conjunctiva/sclera: Conjunctivae normal.     Pupils: Pupils are equal, round, and reactive to light.  Cardiovascular:     Rate and Rhythm: Normal rate and regular rhythm.     Heart sounds: Normal heart sounds.  Pulmonary:      Effort: Pulmonary effort is normal. No respiratory distress.     Breath sounds: Normal breath sounds.  Abdominal:     General: There is no distension.     Palpations: Abdomen is soft.     Tenderness: There is no abdominal tenderness.  Musculoskeletal:        General: No deformity. Normal range of motion.     Cervical back: Normal range of motion and neck supple.  Skin:    General: Skin is warm and dry.  Neurological:     General: No focal deficit present.     Mental Status: She is alert.     Comments: Alert and oriented to person.  Pleasantly confused.  No focal weakness or abnormality noted.     ED Results / Procedures / Treatments   Labs (all labs ordered are listed, but only abnormal results are displayed) Labs Reviewed  RESP PANEL BY RT-PCR (FLU A&B, COVID) ARPGX2 - Abnormal; Notable for the following components:      Result Value   SARS Coronavirus 2 by RT PCR POSITIVE (*)    All other components within normal limits  CBC WITH DIFFERENTIAL/PLATELET - Abnormal; Notable for the following components:   WBC 11.2 (*)    Hemoglobin 15.9 (*)    HCT 48.0 (*)    Platelets 133 (*)    Neutro Abs 8.9 (*)    Abs Immature Granulocytes 0.18 (*)    All other components within normal limits  COMPREHENSIVE METABOLIC PANEL - Abnormal; Notable for the following components:   Glucose, Bld 112 (*)    Creatinine, Ser 1.01 (*)    Calcium 8.1 (*)    Total Protein 6.1 (*)    Albumin 3.2 (*)    ALT 92 (*)    Total Bilirubin 1.7 (*)    GFR, Estimated 54 (*)    All other components within normal limits  URINALYSIS, ROUTINE W REFLEX MICROSCOPIC - Abnormal; Notable for the following components:   APPearance CLOUDY (*)    Hgb urine dipstick MODERATE (*)    Leukocytes,Ua LARGE (*)    Bacteria, UA FEW (*)    All other components within normal limits  TROPONIN I (HIGH SENSITIVITY) - Abnormal; Notable for the following components:   Troponin I (High Sensitivity) 94 (*)    All other components  within normal limits  CULTURE, BLOOD (ROUTINE X 2)  CULTURE, BLOOD (ROUTINE X 2)  RAPID URINE DRUG SCREEN, HOSP PERFORMED  TROPONIN I (HIGH SENSITIVITY)    EKG EKG Interpretation  Date/Time:  Tuesday November 10 2021 08:57:07 EDT Ventricular Rate:  87 PR Interval:    QRS Duration: 82 QT Interval:  447 QTC Calculation: 538 R Axis:   77 Text Interpretation: Atrial fibrillation RSR' in V1 or V2, right VCD or RVH Abnormal T, consider ischemia, lateral leads Prolonged QT interval Confirmed by Dene Gentry [phone]) on 11/10/2021 9:01:12 AM  Radiology DG Chest Port 1 View  Result Date: 11/10/2021 CLINICAL DATA:  Shortness of breath EXAM: PORTABLE CHEST 1 VIEW COMPARISON:  11/01/2021 FINDINGS: The heart size and mediastinal contours are within normal limits. Aortic atherosclerosis. No focal airspace consolidation, pleural effusion, or pneumothorax. The visualized skeletal structures are unremarkable. IMPRESSION: No active disease. Electronically Signed   By: Davina Poke D.O.   On: 11/10/2021 10:13    Procedures Procedures    Medications Ordered in ED Medications  sodium chloride 0.9 % bolus 500 mL (has no administration in time range)    ED Course/ Medical Decision Making/ A&P                           Medical Decision Making Amount and/or Complexity of Data Reviewed Labs: ordered. Radiology: ordered.  Risk OTC drugs. Prescription drug management.    Medical Screen Complete  This patient presented to the ED with complaint of weakness, decreased p.o. intake, suspected UTI.  This complaint involves an extensive number of treatment options. The initial differential diagnosis includes, but is not limited to, infection - bacterial versus viral, metabolic abnormality, etc.  This presentation is: Acute, Chronic, Self-Limited, Previously Undiagnosed, Uncertain Prognosis, Complicated, Systemic Symptoms, and Threat to Life/Bodily Function  Patient is presenting for  evaluation.  Patient's family reports decreased p.o. intake, increasing generalized weakness, and foul-smelling odor of urine.  Family is concerned about recurrent UTI.  Patient is mildly confused on arrival.  This appears to be close to baseline.  Patient noted to have fever of 101.5 on arrival.  UA is suggestive of infection with large amount of leukocytes, and 21-50 WBC, and bacteria present.  COVID test is positive - patient with recently diagnosed COVID infection 12 days prior.  Initial troponin is elevated at 94.  Patient without reported chest pain or shortness of breath.  Patient's son and patient agree with plan for admission for IV antibiotics and rehydration.  Hospitalist service made aware of case and will evaluate for admission.    Additional history obtained:  Additional history obtained from [facility] External records from outside sources obtained and reviewed including prior ED visits and prior Inpatient records.    Lab Tests:  I ordered and personally interpreted labs.  The pertinent results include: CBC, UA, CMP, urine tox, troponin, COVID, flu   Imaging Studies ordered:  I ordered imaging studies including chest x-ray I independently visualized and interpreted obtained imaging which showed NAD I agree with the radiologist interpretation.   Cardiac Monitoring:  The patient was maintained on a cardiac monitor.  I personally viewed and interpreted the cardiac monitor which showed an underlying rhythm of: NSR   Medicines ordered:  I ordered medication including Rocephin, IV fluids, acetaminophen for fever, suspected infection, dehydration Reevaluation of the patient after these medicines showed that the patient: improved    Problem List / ED Course:  Fever, suspected UTI, dehydration   Reevaluation:  After the interventions noted above, I reevaluated the patient and found that they have: improved  Disposition:  After consideration of the  diagnostic results and the patients response to treatment, I feel that the patent would benefit from admission.          Final Clinical  Impression(s) / ED Diagnoses Final diagnoses:  Fever, unspecified fever cause    Rx / DC Orders ED Discharge Orders     None         Valarie Merino, MD 11/10/21 1416

## 2021-11-10 NOTE — ED Triage Notes (Signed)
Pt BIB EMS from home, per family pt has decreased appetite and generalized weakness, and foul smelling odor from urine. Saturations on RA 88%, placed on 2 liters. A&O with confusions. Hx of afib  BP 140/72 P 86 spO2 99% T 97.8 CBG 120

## 2021-11-11 DIAGNOSIS — I10 Essential (primary) hypertension: Secondary | ICD-10-CM

## 2021-11-11 DIAGNOSIS — Z8673 Personal history of transient ischemic attack (TIA), and cerebral infarction without residual deficits: Secondary | ICD-10-CM

## 2021-11-11 DIAGNOSIS — E785 Hyperlipidemia, unspecified: Secondary | ICD-10-CM | POA: Diagnosis present

## 2021-11-11 DIAGNOSIS — N390 Urinary tract infection, site not specified: Secondary | ICD-10-CM

## 2021-11-11 DIAGNOSIS — G4733 Obstructive sleep apnea (adult) (pediatric): Secondary | ICD-10-CM | POA: Diagnosis present

## 2021-11-11 DIAGNOSIS — E869 Volume depletion, unspecified: Secondary | ICD-10-CM | POA: Diagnosis present

## 2021-11-11 DIAGNOSIS — I480 Paroxysmal atrial fibrillation: Secondary | ICD-10-CM | POA: Diagnosis present

## 2021-11-11 DIAGNOSIS — K5730 Diverticulosis of large intestine without perforation or abscess without bleeding: Secondary | ICD-10-CM | POA: Diagnosis not present

## 2021-11-11 DIAGNOSIS — R7989 Other specified abnormal findings of blood chemistry: Secondary | ICD-10-CM

## 2021-11-11 DIAGNOSIS — J849 Interstitial pulmonary disease, unspecified: Secondary | ICD-10-CM | POA: Diagnosis not present

## 2021-11-11 DIAGNOSIS — K6389 Other specified diseases of intestine: Secondary | ICD-10-CM | POA: Diagnosis not present

## 2021-11-11 DIAGNOSIS — M5440 Lumbago with sciatica, unspecified side: Secondary | ICD-10-CM | POA: Diagnosis present

## 2021-11-11 DIAGNOSIS — K3189 Other diseases of stomach and duodenum: Secondary | ICD-10-CM | POA: Diagnosis not present

## 2021-11-11 DIAGNOSIS — R509 Fever, unspecified: Secondary | ICD-10-CM | POA: Diagnosis present

## 2021-11-11 DIAGNOSIS — J431 Panlobular emphysema: Secondary | ICD-10-CM | POA: Diagnosis not present

## 2021-11-11 DIAGNOSIS — R8271 Bacteriuria: Secondary | ICD-10-CM | POA: Diagnosis not present

## 2021-11-11 DIAGNOSIS — E876 Hypokalemia: Secondary | ICD-10-CM | POA: Diagnosis not present

## 2021-11-11 DIAGNOSIS — A419 Sepsis, unspecified organism: Secondary | ICD-10-CM | POA: Diagnosis not present

## 2021-11-11 DIAGNOSIS — R918 Other nonspecific abnormal finding of lung field: Secondary | ICD-10-CM | POA: Diagnosis not present

## 2021-11-11 DIAGNOSIS — J432 Centrilobular emphysema: Secondary | ICD-10-CM | POA: Diagnosis not present

## 2021-11-11 DIAGNOSIS — I4892 Unspecified atrial flutter: Secondary | ICD-10-CM | POA: Diagnosis present

## 2021-11-11 DIAGNOSIS — R531 Weakness: Secondary | ICD-10-CM | POA: Diagnosis not present

## 2021-11-11 DIAGNOSIS — Z8701 Personal history of pneumonia (recurrent): Secondary | ICD-10-CM | POA: Diagnosis not present

## 2021-11-11 DIAGNOSIS — U071 COVID-19: Secondary | ICD-10-CM

## 2021-11-11 DIAGNOSIS — E8770 Fluid overload, unspecified: Secondary | ICD-10-CM | POA: Diagnosis not present

## 2021-11-11 DIAGNOSIS — G9341 Metabolic encephalopathy: Secondary | ICD-10-CM | POA: Diagnosis present

## 2021-11-11 DIAGNOSIS — Z885 Allergy status to narcotic agent status: Secondary | ICD-10-CM | POA: Diagnosis not present

## 2021-11-11 DIAGNOSIS — Z87891 Personal history of nicotine dependence: Secondary | ICD-10-CM | POA: Diagnosis not present

## 2021-11-11 DIAGNOSIS — E8809 Other disorders of plasma-protein metabolism, not elsewhere classified: Secondary | ICD-10-CM | POA: Diagnosis present

## 2021-11-11 DIAGNOSIS — R0602 Shortness of breath: Secondary | ICD-10-CM | POA: Diagnosis not present

## 2021-11-11 DIAGNOSIS — I517 Cardiomegaly: Secondary | ICD-10-CM | POA: Diagnosis not present

## 2021-11-11 DIAGNOSIS — N179 Acute kidney failure, unspecified: Secondary | ICD-10-CM | POA: Diagnosis present

## 2021-11-11 DIAGNOSIS — B952 Enterococcus as the cause of diseases classified elsewhere: Secondary | ICD-10-CM | POA: Diagnosis present

## 2021-11-11 DIAGNOSIS — J439 Emphysema, unspecified: Secondary | ICD-10-CM | POA: Diagnosis present

## 2021-11-11 DIAGNOSIS — J189 Pneumonia, unspecified organism: Secondary | ICD-10-CM | POA: Diagnosis not present

## 2021-11-11 DIAGNOSIS — Z881 Allergy status to other antibiotic agents status: Secondary | ICD-10-CM | POA: Diagnosis not present

## 2021-11-11 DIAGNOSIS — J180 Bronchopneumonia, unspecified organism: Secondary | ICD-10-CM | POA: Diagnosis not present

## 2021-11-11 DIAGNOSIS — I700 Atherosclerosis of aorta: Secondary | ICD-10-CM | POA: Diagnosis not present

## 2021-11-11 DIAGNOSIS — U099 Post covid-19 condition, unspecified: Secondary | ICD-10-CM | POA: Diagnosis not present

## 2021-11-11 LAB — CBC WITH DIFFERENTIAL/PLATELET
Abs Immature Granulocytes: 0.13 10*3/uL — ABNORMAL HIGH (ref 0.00–0.07)
Basophils Absolute: 0 10*3/uL (ref 0.0–0.1)
Basophils Relative: 1 %
Eosinophils Absolute: 0.3 10*3/uL (ref 0.0–0.5)
Eosinophils Relative: 3 %
HCT: 43.2 % (ref 36.0–46.0)
Hemoglobin: 13.9 g/dL (ref 12.0–15.0)
Immature Granulocytes: 2 %
Lymphocytes Relative: 13 %
Lymphs Abs: 1.1 10*3/uL (ref 0.7–4.0)
MCH: 32.9 pg (ref 26.0–34.0)
MCHC: 32.2 g/dL (ref 30.0–36.0)
MCV: 102.1 fL — ABNORMAL HIGH (ref 80.0–100.0)
Monocytes Absolute: 0.7 10*3/uL (ref 0.1–1.0)
Monocytes Relative: 9 %
Neutro Abs: 6.3 10*3/uL (ref 1.7–7.7)
Neutrophils Relative %: 72 %
Platelets: 118 10*3/uL — ABNORMAL LOW (ref 150–400)
RBC: 4.23 MIL/uL (ref 3.87–5.11)
RDW: 12.7 % (ref 11.5–15.5)
WBC: 8.6 10*3/uL (ref 4.0–10.5)
nRBC: 0 % (ref 0.0–0.2)

## 2021-11-11 LAB — COMPREHENSIVE METABOLIC PANEL
ALT: 72 U/L — ABNORMAL HIGH (ref 0–44)
AST: 29 U/L (ref 15–41)
Albumin: 2.7 g/dL — ABNORMAL LOW (ref 3.5–5.0)
Alkaline Phosphatase: 41 U/L (ref 38–126)
Anion gap: 4 — ABNORMAL LOW (ref 5–15)
BUN: 16 mg/dL (ref 8–23)
CO2: 24 mmol/L (ref 22–32)
Calcium: 7.7 mg/dL — ABNORMAL LOW (ref 8.9–10.3)
Chloride: 111 mmol/L (ref 98–111)
Creatinine, Ser: 0.87 mg/dL (ref 0.44–1.00)
GFR, Estimated: >60 mL/min (ref >60)
Glucose, Bld: 130 mg/dL — ABNORMAL HIGH (ref 70–99)
Potassium: 3.8 mmol/L (ref 3.5–5.1)
Sodium: 139 mmol/L (ref 135–145)
Total Bilirubin: 0.9 mg/dL (ref 0.3–1.2)
Total Protein: 5.4 g/dL — ABNORMAL LOW (ref 6.5–8.1)

## 2021-11-11 LAB — MAGNESIUM: Magnesium: 2.2 mg/dL (ref 1.7–2.4)

## 2021-11-11 MED ORDER — SODIUM CHLORIDE 0.9 % IV SOLN: INTRAVENOUS | Status: DC

## 2021-11-11 NOTE — Progress Notes (Signed)
Patient with c/o increase weakness and fatigue today -vs- yesterday. VSS, pt is afebrile.Fluids and antibiotics continued.

## 2021-11-11 NOTE — Plan of Care (Signed)
Patient is awake, stable and sitting up in bed with daughter at the bedside

## 2021-11-11 NOTE — Hospital Course (Addendum)
[name] is a 88 y.o. female with past medical history significant for for paroxysmal atrial fibrillation, flutter, hypertension, obstructive sleep apnea, hyperlipidemia, CVA, hypertension, recurrent pneumonia on suppressive antibiotic treatment who was recently hospitalized for COVID-19 infection and discharged home on prednisone taper presented to hospital with generalized fatigue weakness ambulatory dysfunction and poor oral intake with some fever and chills at home.  Also reported urinary burning micturition.  In the ED patient was febrile with a temperature of 101.5 F.  Laboratory data showed WBC at 11.2.  BMP showed creatinine of 1.0.  Patient has tested positive for COVID.  Urine drug screen was negative.  UA showed large leukocytes but nitrite was negative.  White cells were 21-50.  Urine culture and blood culture was sent from the ED and patient was admitted to the hospital for further evaluation and treatment.Marland Kitchen

## 2021-11-11 NOTE — Progress Notes (Signed)
PROGRESS NOTE    [name]  ZOX:[id number] DOB: 1931/09/20 DOA: 11/10/2021 PCP: Crist Infante, MD    Brief Narrative:  [name] is a 88 y.o. female with past medical history significant for for paroxysmal atrial fibrillation, flutter, hypertension, obstructive sleep apnea, hyperlipidemia, CVA, hypertension, recurrent UTI on suppressive antibiotic treatment who was recently hospitalized for COVID-19 infection and discharged home on prednisone taper presented to hospital with generalized fatigue weakness ambulatory dysfunction and poor oral intake with some fever and chills at home.  Also reported urinary burning micturition.  In the ED, patient was febrile with a temperature of 101.5 F.  Laboratory data showed WBC at 11.2.  BMP showed creatinine of 1.0.  Patient has tested positive for COVID.  Urine drug screen was negative.  UA showed large leukocytes but nitrite was negative.  White cells were 21-50.  Urine culture and blood culture was sent from the ED and patient was admitted to the hospital for further evaluation and treatment..  Assessment and plan. Principal Problem:   Acute metabolic encephalopathy Active Problems:   PAF (paroxysmal atrial fibrillation) (HCC)   History of stroke   Essential hypertension   Hyperlipidemia LDL goal <70   Chronic UTI   Obstructive sleep apnea   COVID-19 virus infection   Abnormal LFTs   Acute metabolic encephalopathy As per the family at bedside patient has some confusion and is not acting normally.  We will continue to monitor.  Patient was able to answer appropriately.  Improving.  Temperature max of 101.7  Volume depletion.  Poor oral intake.  Continue with normal saline 75 mill per hour for 1 more day.  Reassess volume status in AM.  Patient was encouraged oral hydration.  Generalized fatigue and weakness. Secondary to recent COVID illness, deconditioning.  Possible mild pulmonary patient.  Patient was encouraged oral hydration.   Continue IV fluids.  Physical therapy has been consulted.  UTI. Patient with history of chronic UTI on suppressive Keflex.  Follows up with urology as outpatient.  We will follow urine culture.  On IV Rocephin at this time.  Make be good candidate for fosfomycin therapy before discharge.  Recent hospitalization for COVID-19 infection. Testing positive for COVID CT value of 27.6.  Does not have any respiratory symptoms so off isolation at this time.  Still febrile.   Sciatica. Patient is supposed to see Dr. Kathyrn Sheriff outpatient.  Currently in the hospital.  We will need to follow-up with outpatient.   PAF (paroxysmal atrial fibrillation)  Continue Eliquis.  Rate controlled at this time.   History of stroke No focal deficit. Continue anticoagulation and cholesterol medication.   Essential hypertension Blood pressures were initially low.  Holding amlodipine at this time.   Hyperlipidemia LDL goal <70 Statin on hold due to elevated LFT.   Obstructive sleep apnea Continue CPAP.      DVT prophylaxis:  apixaban (ELIQUIS) tablet 5 mg   Code Status:     Code Status: Full Code  Disposition: Home with home health as per PT recommendation  Status is: Observation  The patient will require care spanning > 2 midnights and should be moved to inpatient because:Deconditioning and weakness, IV antibiotic, pending clinical improvement, follow urine cultures   Family Communication: Spoke with the patient's son and daughter at bedside.  Consultants:  None  Procedures:  None  Antimicrobials:  Rocephin  Anti-infectives (From admission, onward)    Start     Dose/Rate Route Frequency Ordered Stop   11/11/21 1000  cefTRIAXone (ROCEPHIN) 1 g in sodium chloride 0.9 % 100 mL IVPB        1 g 200 mL/hr over 30 Minutes Intravenous Every 24 hours 11/10/21 1724     11/10/21 0930  cefTRIAXone (ROCEPHIN) 1 g in sodium chloride 0.9 % 100 mL IVPB        1 g 200 mL/hr over 30 Minutes Intravenous   Once 11/10/21 0917 11/10/21 1140        Subjective: Today, patient was seen and examined at bedside.  Complains of generalized weakness fatigue and tiredness.  Patient's family at bedside and believes that she is just not acting out right.  Objective: Vitals:   11/11/21 0238 11/11/21 0331 11/11/21 0602 11/11/21 1208  BP: 130/64 137/75 (!) 113/94 127/61  Pulse:  64 77 74  Resp: '20 18 20 17  '$ Temp: 98 F (36.7 C) 98.8 F (37.1 C) 98.3 F (36.8 C) 98.6 F (37 C)  TempSrc: Oral Oral Oral Oral  SpO2: 97% 95% 95% 97%  Weight:      Height:        Intake/Output Summary (Last 24 hours) at 11/11/2021 1340 Last data filed at 11/11/2021 1300 Gross per 24 hour  Intake 1959.42 ml  Output 300 ml  Net 1659.42 ml   Filed Weights   11/10/21 0923  Weight: 68 kg    Physical Examination: Body mass index is 29.29 kg/m.  General:  Average built, not in obvious distress HENT:   No scleral pallor or icterus noted. Oral mucosa is mildly dry. Chest:    Diminished breath sounds bilaterally. No crackles or wheezes.  CVS: S1 &S2 heard. No murmur.  Regular rate and rhythm. Abdomen: Soft, nontender, nondistended.  Bowel sounds are heard.   Extremities: No cyanosis, clubbing or edema.  Peripheral pulses are palpable. Psych: Alert, awake and Communicative, CNS:  No cranial nerve deficits.  Oriented to time place and person power equal in all extremities.  Generalized weakness noted. Skin: Warm and dry.  No rashes noted.  Data Reviewed:   CBC: Recent Labs  Lab 11/10/21 0844 11/11/21 0543  WBC 11.2* 8.6  NEUTROABS 8.9* 6.3  HGB 15.9* 13.9  HCT 48.0* 43.2  MCV 98.8 102.1*  PLT 133* 118*    Basic Metabolic Panel: Recent Labs  Lab 11/10/21 0844 11/11/21 0543  NA 136 139  K 4.1 3.8  CL 102 111  CO2 27 24  GLUCOSE 112* 130*  BUN 16 16  CREATININE 1.01* 0.87  CALCIUM 8.1* 7.7*  MG  --  2.2    Liver Function Tests: Recent Labs  Lab 11/10/21 0844 11/11/21 0543  AST 31 29   ALT 92* 72*  ALKPHOS 48 41  BILITOT 1.7* 0.9  PROT 6.1* 5.4*  ALBUMIN 3.2* 2.7*     Radiology Studies: DG Chest Port 1 View  Result Date: 11/10/2021 CLINICAL DATA:  Shortness of breath EXAM: PORTABLE CHEST 1 VIEW COMPARISON:  11/01/2021 FINDINGS: The heart size and mediastinal contours are within normal limits. Aortic atherosclerosis. No focal airspace consolidation, pleural effusion, or pneumothorax. The visualized skeletal structures are unremarkable. IMPRESSION: No active disease. Electronically Signed   By: Davina Poke D.O.   On: 11/10/2021 10:13      LOS: 0 days    Flora Lipps, MD [facility] Available via Epic secure chat 7am-7pm After these hours, please refer to coverage provider listed on [email or web address] 11/11/2021, 1:40 PM

## 2021-11-11 NOTE — Progress Notes (Signed)
Temp 101.4, Tylenol given

## 2021-11-11 NOTE — Evaluation (Signed)
Occupational Therapy Evaluation Patient Details Name: [name] MRN: [id number] DOB: 12/16/1931 Today's Date: 11/11/2021   History of Present Illness [name] is a 88 y.o. female with medical history significant of SVT, tachycardia-bradycardia syndrome, paroxysmal atrial fibrillation, history of atrial flutter, history of ablation, UTI's, CVA,OSA, right Achilles tendinitis, hyperlipidemia, emphysema, hypertensive , sciatica,Recently hospitalized for COVID-19 infection and discharged home on prednisone taper.  comes back 11/09/21 with generalized fatigue with worsening mobility and poor p.o. intake. She was found to have acute metabolic encephalopathy   Clinical Impression   Patient reported generalized feelings of weakness and malaise, however she stated feeling better than she did this a.m. She required min assist to stand, mod assist for lower body dressing, and  min assist for toileting at bathroom level. She was noted to be with slight unsteadiness in standing and deconditioning. She required some reassurance of her functional abilities throughout the session. She will continue to benefit from OT services to maximize her safety and independence with ADLs.      Recommendations for follow up therapy are one component of a multi-disciplinary discharge planning process, led by the attending physician.  Recommendations may be updated based on patient status, additional functional criteria and insurance authorization.   Follow Up Recommendations  Home health OT    Assistance Recommended at Discharge Intermittent Supervision/Assistance  Patient can return home with the following Assistance with cooking/housework;Direct supervision/assist for financial management;Direct supervision/assist for medications management;Help with stairs or ramp for entrance;A little help with bathing/dressing/bathroom;A little help with walking and/or transfers    Functional Status Assessment  Patient has had  a recent decline in their functional status and demonstrates the ability to make significant improvements in function in a reasonable and predictable amount of time.  Equipment Recommendations  None recommended by OT       Precautions / Restrictions Precautions Precautions: Fall Precaution Comments: monitor o2 sats - . urinary incontinent at baseline. Restrictions Weight Bearing Restrictions: No      Mobility Bed Mobility Overal bed mobility: Needs Assistance Bed Mobility: Supine to Sit     Supine to sit: Min assist          Transfers Overall transfer level: Needs assistance Equipment used: Rolling walker (2 wheels) Transfers: Sit to/from Stand, Bed to chair/wheelchair/BSC Sit to Stand: Min assist             Balance     Sitting balance-Leahy Scale: Good         Standing balance comment: min guard to min assist with rolling walker           ADL either performed or assessed with clinical judgement   ADL   Eating/Feeding: Independent   Grooming: Set up;Min guard Grooming Details (indicate cue type and reason): Set-up seated or min guard standing at sink         Upper Body Dressing : Supervision/safety;Set up;Sitting   Lower Body Dressing: Sit to/from stand;Moderate assistance Lower Body Dressing Details (indicate cue type and reason): She required assist to don underwear over her ankles in sitting, then all the way up over her hips in standing. Toilet Transfer: Rolling walker (2 wheels);Cueing for safety;Minimal assistance   Toileting- Clothing Manipulation and Hygiene: Minimal assistance;Sit to/from stand Toileting - Clothing Manipulation Details (indicate cue type and reason): at bathroom level             Vision   Additional Comments: She wears reading glasses. She correctly read the time depicted on the wall  clock.     Perception     Praxis      Pertinent Vitals/Pain Pain Assessment Pain Assessment: No/denies pain     Hand  Dominance Right   Extremity/Trunk Assessment Upper Extremity Assessment Upper Extremity Assessment: Overall WFL for tasks assessed   Lower Extremity Assessment Lower Extremity Assessment: Overall WFL for tasks assessed      Communication Communication Communication: No difficulties                 Home Living Family/patient expects to be discharged to:: Private residence Living Arrangements: Alone Available Help at Discharge: Family Type of Home: House Home Access: Stairs to enter [employer] of Steps: 1 Entrance Stairs-Rails: None Home Layout: Two level;Able to live on main level with bedroom/bathroom     Bathroom Shower/Tub: Walk-in shower;Tub/shower unit   Bathroom Toilet: Standard Bathroom Accessibility: Yes   Home Equipment: Conservation officer, nature (2 wheels);Cane - single point;Rollator (4 wheels);BSC/3in1;Shower seat   Additional Comments: BSC commode frame is over her toilet. Her home is 2 levels, however her bedroom and a full bathroom are downstairs on main level      Prior Functioning/Environment Prior Level of Function : Independent/Modified Independent             Mobility Comments: She holds onto furniture inside the home & onto her family when ambulating outside the home. ADLs Comments: She was modified independent to independent with ADLs, her family brings her meals and helps with cleaning, and she also has hired assistance for household cleaning 1x per month.        OT Problem List: Decreased strength;Decreased activity tolerance;Impaired balance (sitting and/or standing);Decreased safety awareness;Decreased knowledge of use of DME or AE      OT Treatment/Interventions: Self-care/ADL training;Therapeutic exercise;Neuromuscular education;Energy conservation;Cognitive remediation/compensation;DME and/or AE instruction;Patient/family education;Therapeutic activities    OT Goals(Current goals can be found in the care plan section) Acute Rehab  OT Goals Patient Stated Goal: to get better OT Goal Formulation: With patient Time For Goal Achievement: 11/25/21 Potential to Achieve Goals: Good ADL Goals Pt Will Perform Grooming: with modified independence;standing Pt Will Perform Lower Body Dressing: with modified independence;sit to/from stand Pt Will Transfer to Toilet: with modified independence;ambulating Pt Will Perform Toileting - Clothing Manipulation and hygiene: with modified independence;sit to/from stand  OT Frequency: Min 2X/week       AM-PAC OT "6 Clicks" Daily Activity     Outcome Measure Help from another person eating meals?: None Help from another person taking care of personal grooming?: None Help from another person toileting, which includes using toliet, bedpan, or urinal?: A Little Help from another person bathing (including washing, rinsing, drying)?: A Lot Help from another person to put on and taking off regular upper body clothing?: None Help from another person to put on and taking off regular lower body clothing?: A Lot 6 Click Score: 19   End of Session Equipment Utilized During Treatment: Gait belt;Rolling walker (2 wheels) Nurse Communication: Mobility status  Activity Tolerance: Patient tolerated treatment well Patient left: in bed;with call bell/phone within reach;with bed alarm set  OT Visit Diagnosis: Unsteadiness on feet (R26.81);Muscle weakness (generalized) (M62.81)                Time: 9924-2683 OT Time Calculation (min): 22 min Charges:  OT General Charges $OT Visit: 1 Visit OT Evaluation $OT Eval Low Complexity: 1 Low   Asael Pann L Samirah Scarpati, OTR/L 11/11/2021, 4:45 PM

## 2021-11-11 NOTE — Evaluation (Signed)
Physical Therapy Evaluation Patient Details Name: [name] MRN: [id number] DOB: Jan 02, 1932 Today's Date: 11/11/2021  History of Present Illness  [name] is a 88 y.o. female with medical history significant of SVT, tachycardia-bradycardia syndrome, paroxysmal atrial fibrillation, history of atrial flutter, history of ablation, UTI's, CVA,OSA, right Achilles tendinitis, hyperlipidemia, emphysema, hypertensive , sciatica,Recently hospitalized for COVID-19 infection and discharged home on prednisone taper.  comes back 11/09/21 with generalized fatigue with worsening mobility and poor p.o. intake,Acute metabolic encephalopathy, UTI.  Clinical Impression  The patient  was admitted with above problems. Patient recently DC'd  from [facility] due to   Covid. Patient's daughter present.  Patient  presents with decreased balance, asked to have temp taken because she felt hot. Temp  99.1, RN aware.  Patient required mod assistance to  move to sitting, then to stand and pivot to recliner, posterior balance loss.  Patient's daughter reports plans are for patient to return to Home with 24/7 caregivers. Pt admitted with above diagnosis.  Pt currently with functional limitations due to the deficits listed below (see PT Problem List). Pt will benefit from skilled PT to increase their independence and safety with mobility to allow discharge to the venue listed below.        Recommendations for follow up therapy are one component of a multi-disciplinary discharge planning process, led by the attending physician.  Recommendations may be updated based on patient status, additional functional criteria and insurance authorization.  Follow Up Recommendations Home health PT      Assistance Recommended at Discharge Frequent or constant Supervision/Assistance  Patient can return home with the following  A little help with walking and/or transfers;A little help with bathing/dressing/bathroom;Help with stairs or ramp  for entrance;Assistance with cooking/housework;Assist for transportation    Equipment Recommendations None recommended by PT  Recommendations for Other Services       Functional Status Assessment Patient has had a recent decline in their functional status and demonstrates the ability to make significant improvements in function in a reasonable and predictable amount of time.     Precautions / Restrictions Precautions Precautions: Fall Precaution Comments: monitor o2 sats - . urinary incontinent at baseline.      Mobility  Bed Mobility Overal bed mobility: Needs Assistance Bed Mobility: Rolling, Sidelying to Sit Rolling: Mod assist Sidelying to sit: Mod assist       General bed mobility comments: multimodal cues    Transfers Overall transfer level: Needs assistance Equipment used: Rolling walker (2 wheels) Transfers: Sit to/from Stand, Bed to chair/wheelchair/BSC Sit to Stand: Mod assist           General transfer comment: posterior bias when pushing to stand up, multimodal cues to step to the side, push down on RW, RW front coming up from contact with ground,  posterior lean strong initially    Ambulation/Gait                  Stairs            Wheelchair Mobility    Modified Rankin (Stroke Patients Only)       Balance Overall balance assessment: Needs assistance Sitting-balance support: Bilateral upper extremity supported, Feet supported Sitting balance-Leahy Scale: Fair     Standing balance support: During functional activity, Bilateral upper extremity supported, Reliant on assistive device for balance Standing balance-Leahy Scale: Poor Standing balance comment: stromg posterior bias initially, able to improve and push down throught the RW  Pertinent Vitals/Pain Pain Assessment Pain Assessment: Faces Faces Pain Scale: Hurts little more Pain Location: all over Pain Descriptors / Indicators:  Aching Pain Intervention(s): Monitored during session, Limited activity within patient's tolerance    Home Living Family/patient expects to be discharged to:: Private residence Living Arrangements: Alone Available Help at Discharge: Available 24 hours/day;Family Type of Home: House Home Access: Stairs to enter Entrance Stairs-Rails: None Entrance Stairs-Number of Steps: 2   Home Layout: Two level;Able to live on main level with bedroom/bathroom Home Equipment: Rollator (4 wheels);Shower seat      Prior Function               Mobility Comments: has been using rollator, furniture walks at baseline ADLs Comments: grossly independent, supervision for bathing     Hand Dominance   Dominant Hand: Right    Extremity/Trunk Assessment        Lower Extremity Assessment Lower Extremity Assessment: Generalized weakness    Cervical / Trunk Assessment Cervical / Trunk Assessment: Normal  Communication   Communication: No difficulties  Cognition   Behavior During Therapy: WFL for tasks assessed/performed Overall Cognitive Status: Impaired/Different from baseline Area of Impairment: Orientation, Attention, Following commands                 Orientation Level: Time Current Attention Level: Selective   Following Commands: Follows one step commands with increased time       General Comments: some delay and need to repest instruction        General Comments      Exercises     Assessment/Plan    PT Assessment Patient needs continued PT services  PT Problem List Decreased strength;Pain;Decreased mobility;Decreased knowledge of use of DME       PT Treatment Interventions DME instruction;Therapeutic activities;Gait training;Therapeutic exercise;Cognitive remediation;Patient/family education;Functional mobility training;Balance training    PT Goals (Current goals can be found in the Care Plan section)  Acute Rehab PT Goals Patient Stated Goal: return home PT  Goal Formulation: With patient/family Time For Goal Achievement: 11/25/21 Potential to Achieve Goals: Good    Frequency Min 3X/week     Co-evaluation               AM-PAC PT "6 Clicks" Mobility  Outcome Measure   Help needed moving from lying on your back to sitting on the side of a flat bed without using bedrails?: A Lot Help needed moving to and from a bed to a chair (including a wheelchair)?: A Lot Help needed standing up from a chair using your arms (e.g., wheelchair or bedside chair)?: A Lot Help needed to walk in hospital room?: Total Help needed climbing 3-5 steps with a railing? : Total 6 Click Score: 8    End of Session Equipment Utilized During Treatment: Gait belt Activity Tolerance: Patient limited by fatigue Patient left: in chair;with call bell/phone within reach;with family/visitor present Nurse Communication: Mobility status PT Visit Diagnosis: Difficulty in walking, not elsewhere classified (R26.2)    Time: 2979-8921 PT Time Calculation (min) (ACUTE ONLY): 25 min   Charges:   PT Evaluation $PT Eval Low Complexity: 1 Low PT Treatments $Therapeutic Activity: 8-22 mins        Tresa Endo [place] Office [phone] Weekend [phone]   Claretha Cooper 11/11/2021, 1:05 PM

## 2021-11-11 NOTE — Progress Notes (Signed)
   11/11/21 0132  Assess: MEWS Score  Temp (!) 101.7 F (38.7 C)  BP (!) 150/75  MAP (mmHg) 98  Pulse Rate 83  Resp 20  SpO2 98 %  Assess: MEWS Score  MEWS Temp 2  MEWS Systolic 0  MEWS Pulse 0  MEWS RR 0  MEWS LOC 0  MEWS Score 2  MEWS Score Color Yellow  Assess: if the MEWS score is Yellow or Red  Were vital signs taken at a resting state? Yes  Focused Assessment Change from prior assessment (see assessment flowsheet)  Does the patient meet 2 or more of the SIRS criteria? Yes  Does the patient have a confirmed or suspected source of infection? Yes  Provider and Rapid Response Notified? Yes  MEWS guidelines implemented *See Row Information* Yes  Treat  MEWS Interventions Administered prn meds/treatments  Pain Scale 0-10  Pain Score 0  Take Vital Signs  Increase Vital Sign Frequency  Yellow: Q 2hr X 2 then Q 4hr X 2, if remains yellow, continue Q 4hrs  Escalate  MEWS: Escalate Yellow: discuss with charge nurse/RN and consider discussing with provider and RRT  Notify: Charge Nurse/RN  Name of Charge Nurse/RN Notified T Madeline Bebout  Date Charge Nurse/RN Notified 11/11/21  Time Charge Nurse/RN Notified 0102  Notify: Provider  Provider Name/Title Olena Heckle  Date Provider Notified 11/11/21  Time Provider Notified 0148  Method of Notification Page  Notification Reason Change in status  Provider response No new orders  Date of Provider Response 11/11/21  Time of Provider Response 0150  Document  Patient Outcome Stabilized after interventions  Progress note created (see row info) Yes  Assess: SIRS CRITERIA  SIRS Temperature  1  SIRS Pulse 0  SIRS Respirations  0  SIRS WBC 1  SIRS Score Sum  2

## 2021-11-11 NOTE — TOC Initial Note (Signed)
Transition of Care [facility]) - Initial/Assessment Note    Patient Details  Name: [name] MRN: [id number] Date of Birth: [birth year]  Transition of Care [facility]) CM/SW Contact:    Vassie Moselle, LCSW Phone Number: 11/11/2021, 11:24 AM  Clinical Narrative:                 CSW received call from [place] who shared that this pt is currently active with their agency for HHPT.   TOC will continue to follow for further recommendations and discharge plans.   Expected Discharge Plan: [place] Barriers to Discharge: No Barriers Identified   Patient Goals and CMS Choice Patient states their goals for this hospitalization and ongoing recovery are:: To go home CMS Medicare.gov Compare Post Acute Care list provided to:: Patient Choice offered to / list presented to : Patient  Expected Discharge Plan and Services Expected Discharge Plan: [place] In-house Referral: NA Discharge Planning Services: NA Post Acute Care Choice: [place] arrangements for the past 2 months: Single Family Home                 DME Arranged: N/A DME Agency: NA                  Prior Living Arrangements/Services Living arrangements for the past 2 months: Single Family Home Lives with:: Self Patient language and need for interpreter reviewed:: Yes Do you feel safe going back to the place where you live?: Yes      Need for Family Participation in Patient Care: No (Comment) Care giver support system in place?: No (comment) Current home services: DME, Home PT    Activities of Daily Living Home Assistive Devices/Equipment: None ADL Screening (condition at time of admission) Patient's cognitive ability adequate to safely complete daily activities?: Yes Is the patient deaf or have difficulty hearing?: No Does the patient have difficulty seeing, even when wearing glasses/contacts?: No Does the patient have difficulty concentrating, remembering, or making  decisions?: No Patient able to express need for assistance with ADLs?: Yes Does the patient have difficulty dressing or bathing?: Yes Independently performs ADLs?: Yes (appropriate for developmental age) Does the patient have difficulty walking or climbing stairs?: Yes Weakness of Legs: Both Weakness of Arms/Hands: None  Permission Sought/Granted   Permission granted to share information with : No              Emotional Assessment   Attitude/Demeanor/Rapport: Unable to Assess Affect (typically observed): Unable to Assess Orientation: : Oriented to Self, Oriented to Place, Oriented to  Time, Oriented to Situation Alcohol / Substance Use: Not Applicable Psych Involvement: No (comment)  Admission diagnosis:  Fever, unspecified fever cause [E72.0] Acute metabolic encephalopathy [N47.09] Patient Active Problem List   Diagnosis Date Noted   Acute metabolic encephalopathy 62/83/6629   Hypoalbuminemia 11/03/2021   Abnormal LFTs 11/03/2021   Elevated BUN 11/03/2021   Leukocytosis 11/03/2021   COVID-19 virus infection 10/30/2021   Tachycardia-bradycardia syndrome ([place]) 05/19/2021   Obstructive sleep apnea 05/19/2021   Pseudoaneurysm of femoral artery following procedure ([place]) 11/23/2019   CAP (community acquired pneumonia) 11/23/2019   Essential hypertension 11/23/2019   Hyperlipidemia LDL goal <70 11/23/2019   Emphysema of lung ([place]) 11/23/2019   Aortic atherosclerosis ([place]) 11/23/2019   Acute blood loss anemia 11/23/2019   Acute ischemic stroke (HCC)    Pseudoaneurysm (HCC)    Slow transit constipation    Chronic UTI    Acute ischemic right  PCA stroke ([place]) s/p tPA and mechanical thrombectomy, d/t AF not on [facility] 11/14/2019   Acute respiratory failure with hypoxia (HCC)    History of stroke 11/13/2019   Degeneration of lumbar intervertebral disc 02/01/2019   Impacted cerumen of both ears 07/25/2015   Conductive hearing loss of both ears 07/25/2015   Hymenoptera allergy  10/05/2014   SVT (supraventricular tachycardia) 10/20/2012   PAF (paroxysmal atrial fibrillation) ([place])    Atrial flutter ([place])    PCP:  Crist Infante, MD Pharmacy:   [place], [place] [address] Phone: [phone] Fax: [phone]     Social Determinants of Health (SDOH) Interventions    Readmission Risk Interventions    11/02/2021   12:11 PM  Readmission Risk Prevention Plan  Transportation Screening Complete  PCP or Specialist Appt within 5-7 Days Complete  Home Care Screening Complete  Medication Review (RN CM) Complete

## 2021-11-12 ENCOUNTER — Inpatient Hospital Stay (HOSPITAL_COMMUNITY): Payer: Medicare Other

## 2021-11-12 DIAGNOSIS — G9341 Metabolic encephalopathy: Secondary | ICD-10-CM | POA: Diagnosis not present

## 2021-11-12 DIAGNOSIS — R7989 Other specified abnormal findings of blood chemistry: Secondary | ICD-10-CM | POA: Diagnosis not present

## 2021-11-12 DIAGNOSIS — E876 Hypokalemia: Secondary | ICD-10-CM

## 2021-11-12 DIAGNOSIS — R509 Fever, unspecified: Secondary | ICD-10-CM | POA: Diagnosis not present

## 2021-11-12 DIAGNOSIS — N390 Urinary tract infection, site not specified: Secondary | ICD-10-CM | POA: Diagnosis not present

## 2021-11-12 LAB — BLOOD CULTURE ID PANEL (REFLEXED) - BCID2

## 2021-11-12 LAB — CBC
HCT: 37.3 % (ref 36.0–46.0)
Hemoglobin: 12.3 g/dL (ref 12.0–15.0)
MCH: 33 pg (ref 26.0–34.0)
MCHC: 33 g/dL (ref 30.0–36.0)
MCV: 100 fL (ref 80.0–100.0)
Platelets: 106 10*3/uL — ABNORMAL LOW (ref 150–400)
RBC: 3.73 MIL/uL — ABNORMAL LOW (ref 3.87–5.11)
RDW: 12.6 % (ref 11.5–15.5)
WBC: 7.2 10*3/uL (ref 4.0–10.5)
nRBC: 0 % (ref 0.0–0.2)

## 2021-11-12 LAB — BASIC METABOLIC PANEL
Anion gap: 7 (ref 5–15)
BUN: 13 mg/dL (ref 8–23)
CO2: 22 mmol/L (ref 22–32)
Calcium: 7.5 mg/dL — ABNORMAL LOW (ref 8.9–10.3)
Chloride: 108 mmol/L (ref 98–111)
Creatinine, Ser: 0.61 mg/dL (ref 0.44–1.00)
GFR, Estimated: >60 mL/min (ref >60)
Glucose, Bld: 137 mg/dL — ABNORMAL HIGH (ref 70–99)
Potassium: 3.2 mmol/L — ABNORMAL LOW (ref 3.5–5.1)
Sodium: 137 mmol/L (ref 135–145)

## 2021-11-12 LAB — URINE CULTURE: Culture: 30000 — AB

## 2021-11-12 LAB — MAGNESIUM: Magnesium: 2 mg/dL (ref 1.7–2.4)

## 2021-11-12 MED ORDER — SACCHAROMYCES BOULARDII 250 MG PO CAPS
250.0000 mg | ORAL_CAPSULE | Freq: Every day | ORAL | Status: DC
  Administered 2021-11-12 – 2021-11-17 (×5): 250 mg via ORAL
  Filled 2021-11-12 (×5): qty 1

## 2021-11-12 MED ORDER — VITAMIN C 500 MG PO TABS
500.0000 mg | ORAL_TABLET | Freq: Every day | ORAL | Status: DC
  Administered 2021-11-12 – 2021-11-17 (×6): 500 mg via ORAL
  Filled 2021-11-12 (×6): qty 1

## 2021-11-12 MED ORDER — SODIUM CHLORIDE 0.9 % IV SOLN: INTRAVENOUS | Status: DC

## 2021-11-12 MED ORDER — ENSURE ENLIVE PO LIQD
237.0000 mL | Freq: Two times a day (BID) | ORAL | Status: DC
  Administered 2021-11-13 – 2021-11-16 (×6): 237 mL via ORAL

## 2021-11-12 MED ORDER — POTASSIUM CHLORIDE CRYS ER 20 MEQ PO TBCR
40.0000 meq | EXTENDED_RELEASE_TABLET | Freq: Once | ORAL | Status: AC
  Administered 2021-11-12: 40 meq via ORAL
  Filled 2021-11-12: qty 2

## 2021-11-12 MED ORDER — AMOXICILLIN-POT CLAVULANATE 875-125 MG PO TABS
1.0000 | ORAL_TABLET | Freq: Two times a day (BID) | ORAL | Status: DC
  Administered 2021-11-12 – 2021-11-14 (×5): 1 via ORAL
  Filled 2021-11-12 (×5): qty 1

## 2021-11-12 MED ORDER — CRANBERRY 250 MG PO TABS: 650.0000 mg | ORAL_TABLET | Freq: Every day | ORAL | Status: DC

## 2021-11-12 MED ORDER — ZINC SULFATE 220 (50 ZN) MG PO CAPS
220.0000 mg | ORAL_CAPSULE | Freq: Every day | ORAL | Status: DC
  Administered 2021-11-12 – 2021-11-17 (×6): 220 mg via ORAL
  Filled 2021-11-12 (×6): qty 1

## 2021-11-12 MED ORDER — CALCIUM POLYCARBOPHIL 625 MG PO TABS
625.0000 mg | ORAL_TABLET | Freq: Every day | ORAL | Status: DC
  Administered 2021-11-12 – 2021-11-17 (×6): 625 mg via ORAL
  Filled 2021-11-12 (×6): qty 1

## 2021-11-12 MED ORDER — FIBER ADULT GUMMIES 2 G PO CHEW: CHEWABLE_TABLET | Freq: Every day | ORAL | Status: DC

## 2021-11-12 MED ORDER — AMLODIPINE BESYLATE 5 MG PO TABS
2.5000 mg | ORAL_TABLET | Freq: Every day | ORAL | Status: DC
  Administered 2021-11-12 – 2021-11-17 (×6): 2.5 mg via ORAL
  Filled 2021-11-12 (×6): qty 1

## 2021-11-12 MED ORDER — PHILLIPS COLON HEALTH PO CAPS: 1.0000 | ORAL_CAPSULE | Freq: Every day | ORAL | Status: DC

## 2021-11-12 NOTE — Progress Notes (Addendum)
PROGRESS NOTE    [name]  GYI:[id number] DOB: 02-06-1931 DOA: 11/10/2021 PCP: Crist Infante, MD    Brief Narrative:  [name] is a 88 y.o. female with past medical history significant for for paroxysmal atrial fibrillation, flutter, hypertension, obstructive sleep apnea, hyperlipidemia, CVA, hypertension, recurrent UTI on suppressive antibiotic treatment who was recently hospitalized for COVID-19 infection and discharged home on prednisone taper presented to hospital with generalized fatigue weakness ambulatory dysfunction and poor oral intake with some fever and chills at home.  Also reported urinary burning micturition.  In the ED, patient was febrile with a temperature of 101.5 F.  Laboratory data showed WBC at 11.2.  BMP showed creatinine of 1.0.  Patient has tested positive for COVID.  Urine drug screen was negative.  UA showed large leukocytes but nitrite was negative.  White cells were 21-50.  Urine culture and blood culture was sent from the ED and patient was admitted to the hospital for further evaluation and treatment..  Assessment and plan. Principal Problem:   Acute metabolic encephalopathy Active Problems:   PAF (paroxysmal atrial fibrillation) (HCC)   History of stroke   Essential hypertension   Hyperlipidemia LDL goal <70   Chronic UTI   Obstructive sleep apnea   COVID-19 virus infection   Abnormal LFTs   Generalized weakness   Fever   Hypokalemia   Acute metabolic encephalopathy As per the family at bedside patient has some confusion and is not acting normally.  Patient is fully alert awake and oriented at this time and Communicative.  We will continue to monitor.  Patient was able to answer appropriately.  Improving.  Temperature max of 103 F.  Continue to monitor.  Urine culture showing Enterococcus.  Blood culture with gram-positive cocci in clusters.  On IV Rocephin.  Will change to Augmentin.  Hypoalbuminemia, poor oral intake.  We will add Ensure  supplement.  Encouraged improved intake.  Enterococcal UTI On antibiotic.  Gram-positive bacteremia.  Staph epidermidis.  Likely contaminant.  We will continue to monitor.  Volume depletion.  Increased oral intake.  Received normal saline during hospitalization.  We will decrease normal saline to 50 mill per hour.  Hypokalemia.  Will replace orally.  Check levels in AM.  Magnesium level 2.0.  Generalized fatigue and weakness. Altered factorial secondary to recent COVID illness, deconditioning , hypokalemia, volume depletion. Patient was encouraged oral hydration.  Continue IV fluids.  Physical therapy has seen the patient and recommend home health PT OT on discharge.  UTI. Patient with history of chronic UTI on suppressive Keflex.  Follows up with urology as outpatient.  Urine culture with 30 K colony of enterococcus..  On IV Rocephin at this time.  Changed to Augmentin.  We will give 1 dose of fosfomycin prior to discharge.  Recent hospitalization for COVID-19 infection. r COVID CT value of 27.6.  Does not have any respiratory symptoms, so off isolation at this time.  Still febrile.   Sciatica. Patient is supposed to see Dr. Kathyrn Sheriff outpatient.  Currently in the hospital.  We will need to follow-up with outpatient.   PAF (paroxysmal atrial fibrillation)  Continue Eliquis.  Rate controlled at this time.   History of stroke No focal deficit. Continue anticoagulation and cholesterol medication.   Essential hypertension Blood pressures were initially low.  Now improved.  Will resume amlodipine.   Hyperlipidemia LDL goal <70. Statin on hold due to elevated LFT.   Obstructive sleep apnea Continue CPAP.  DVT prophylaxis:  apixaban (ELIQUIS) tablet 5 mg   Code Status:     Code Status: Full Code  Disposition: Home with home health as per PT recommendation likely 11/13/2021 if fever improved/  Status is: Inpatient  The patient is inpatient because:Deconditioning and  weakness,  antibiotic, pending clinical improvement, IV fluids, bacteremia,   Family Communication: Spoke with the patient's daughter at bedside.  Consultants:  None  Procedures:  None  Antimicrobials:  Augmentin 11/12/2021  Anti-infectives (From admission, onward)    Start     Dose/Rate Route Frequency Ordered Stop   11/12/21 1045  amoxicillin-clavulanate (AUGMENTIN) 875-125 MG per tablet 1 tablet        1 tablet Oral 2 times daily 11/12/21 0951     11/11/21 1000  cefTRIAXone (ROCEPHIN) 1 g in sodium chloride 0.9 % 100 mL IVPB  Status:  Discontinued        1 g 200 mL/hr over 30 Minutes Intravenous Every 24 hours 11/10/21 1724 11/12/21 0949   11/10/21 0930  cefTRIAXone (ROCEPHIN) 1 g in sodium chloride 0.9 % 100 mL IVPB        1 g 200 mL/hr over 30 Minutes Intravenous  Once 11/10/21 0917 11/10/21 1140      Subjective: Today, patient was seen and examined at bedside.  Denies any nausea vomiting but fever was recorded.  Still has f fatigue and weakness.  More alert awake and communicative.    Objective: Vitals:   11/12/21 0326 11/12/21 0428 11/12/21 0534 11/12/21 0820  BP: (!) 173/74  (!) 150/71 (!) 147/75  Pulse: (!) 56  77 78  Resp: '20  19 18  '$ Temp: (!) 103 F (39.4 C) 99 F (37.2 C) 98.4 F (36.9 C) 98 F (36.7 C)  TempSrc: Oral Oral  Oral  SpO2: 94%  91% 97%  Weight:      Height:        Intake/Output Summary (Last 24 hours) at 11/12/2021 1141 Last data filed at 11/12/2021 0930 Gross per 24 hour  Intake 2164.81 ml  Output 900 ml  Net 1264.81 ml   Filed Weights   11/10/21 0923  Weight: 68 kg    Physical Examination: Body mass index is 29.29 kg/m.   General:  Average built, not in obvious distress, Communicative, HENT:   No scleral pallor or icterus noted. Oral mucosa is moist.  Chest:  Diminished breath sounds bilaterally. No crackles or wheezes.  CVS: S1 &S2 heard. No murmur.  Regular rate and rhythm. Abdomen: Soft, nontender, nondistended.  Bowel  sounds are heard.   Extremities: No cyanosis, clubbing or edema.  Peripheral pulses are palpable. Psych: Alert, awake and oriented, normal mood CNS:  No cranial nerve deficits.  Moves all extremities. Skin: Warm and dry.  No rashes noted.  Data Reviewed:   CBC: Recent Labs  Lab 11/10/21 0844 11/11/21 0543 11/12/21 0512  WBC 11.2* 8.6 7.2  NEUTROABS 8.9* 6.3  --   HGB 15.9* 13.9 12.3  HCT 48.0* 43.2 37.3  MCV 98.8 102.1* 100.0  PLT 133* 118* 106*    Basic Metabolic Panel: Recent Labs  Lab 11/10/21 0844 11/11/21 0543 11/12/21 0512  NA 136 139 137  K 4.1 3.8 3.2*  CL 102 111 108  CO2 '27 24 22  '$ GLUCOSE 112* 130* 137*  BUN '16 16 13  '$ CREATININE 1.01* 0.87 0.61  CALCIUM 8.1* 7.7* 7.5*  MG  --  2.2 2.0    Liver Function Tests: Recent Labs  Lab 11/10/21 0844 11/11/21  0543  AST 31 29  ALT 92* 72*  ALKPHOS 48 41  BILITOT 1.7* 0.9  PROT 6.1* 5.4*  ALBUMIN 3.2* 2.7*     Radiology Studies: No results found.    LOS: 1 day    Flora Lipps, MD [facility] Available via Epic secure chat 7am-7pm After these hours, please refer to coverage provider listed on [email or web address] 11/12/2021, 11:41 AM

## 2021-11-12 NOTE — Progress Notes (Signed)
PHARMACY - PHYSICIAN COMMUNICATION CRITICAL VALUE ALERT - BLOOD CULTURE IDENTIFICATION (BCID)  [name] is an 88 y.o. female who presented to [facility] on 11/10/2021 with a chief complaint of generalized fatigue  Assessment:  Staph Epi, MecA+, 1/3  Name of physician (or Provider) Contacted:  Abigail Butts  Current antibiotics: CTX  Changes to prescribed antibiotics recommended:  None, probable contaminant  Results for orders placed or performed during the hospital encounter of 11/10/21  Blood Culture ID Panel (Reflexed) (Collected: 11/10/2021  9:05 AM)  Result Value Ref Range   Enterococcus faecalis NOT DETECTED NOT DETECTED   Enterococcus Faecium NOT DETECTED NOT DETECTED   Listeria monocytogenes NOT DETECTED NOT DETECTED   Staphylococcus species DETECTED (A) NOT DETECTED   Staphylococcus aureus (BCID) NOT DETECTED NOT DETECTED   Staphylococcus epidermidis DETECTED (A) NOT DETECTED   Staphylococcus lugdunensis NOT DETECTED NOT DETECTED   Streptococcus species NOT DETECTED NOT DETECTED   Streptococcus agalactiae NOT DETECTED NOT DETECTED   Streptococcus pneumoniae NOT DETECTED NOT DETECTED   Streptococcus pyogenes NOT DETECTED NOT DETECTED   A.calcoaceticus-baumannii NOT DETECTED NOT DETECTED   Bacteroides fragilis NOT DETECTED NOT DETECTED   Enterobacterales NOT DETECTED NOT DETECTED   Enterobacter cloacae complex NOT DETECTED NOT DETECTED   Escherichia coli NOT DETECTED NOT DETECTED   Klebsiella aerogenes NOT DETECTED NOT DETECTED   Klebsiella oxytoca NOT DETECTED NOT DETECTED   Klebsiella pneumoniae NOT DETECTED NOT DETECTED   Proteus species NOT DETECTED NOT DETECTED   Salmonella species NOT DETECTED NOT DETECTED   Serratia marcescens NOT DETECTED NOT DETECTED   Haemophilus influenzae NOT DETECTED NOT DETECTED   Neisseria meningitidis NOT DETECTED NOT DETECTED   Pseudomonas aeruginosa NOT DETECTED NOT DETECTED   Stenotrophomonas maltophilia NOT DETECTED NOT DETECTED    Candida albicans NOT DETECTED NOT DETECTED   Candida auris NOT DETECTED NOT DETECTED   Candida glabrata NOT DETECTED NOT DETECTED   Candida krusei NOT DETECTED NOT DETECTED   Candida parapsilosis NOT DETECTED NOT DETECTED   Candida tropicalis NOT DETECTED NOT DETECTED   Cryptococcus neoformans/gattii NOT DETECTED NOT DETECTED   Methicillin resistance mecA/C DETECTED (A) NOT DETECTED   Dolly Rias RPh 11/12/2021, 2:07 AM

## 2021-11-12 NOTE — Progress Notes (Signed)
PHARMACIST - PHYSICIAN ORDER COMMUNICATION  CONCERNING: P&T Medication Policy on Herbal Medications  DESCRIPTION:  This patient's order for:  Cranberry  has been noted.  This product(s) is classified as an "herbal" or natural product. Due to a lack of definitive safety studies or FDA approval, nonstandard manufacturing practices, plus the potential risk of unknown drug-drug interactions while on inpatient medications, the Pharmacy and Therapeutics Committee does not permit the use of "herbal" or natural products of this type within [facility].   ACTION TAKEN: The pharmacy department is unable to verify this order at this time and your patient has been informed of this safety policy. Please reevaluate patient's clinical condition at discharge and address if the herbal or natural product(s) should be resumed at that time.  Minda Ditto PharmD 11/12/2021, 11:45 AM

## 2021-11-12 NOTE — Progress Notes (Signed)
Physical Therapy Treatment Patient Details Name: [name] MRN: [id number] DOB: [birth year] Today's Date: 11/12/2021   History of Present Illness [name] is a 88 y.o. female with medical history significant of SVT, tachycardia-bradycardia syndrome, paroxysmal atrial fibrillation, history of atrial flutter, history of ablation, UTI's, CVA,OSA, right Achilles tendinitis, hyperlipidemia, emphysema, hypertensive , sciatica,Recently hospitalized for COVID-19 infection and discharged home on prednisone taper.  comes back 11/09/21 with generalized fatigue with worsening mobility and poor p.o. intake. She was found to have acute metabolic encephalopathy    PT Comments    Pt was seen yesterday by PT and HHPT was recommended.  MD and family requested pt to be seen again today to re-assess.  Pt appears to feel poorly and is quite weak with flat affect.  She sat up on EOB for 60 seconds before so fatigued she had to return supine. Once supine she began vomiting.  Pt not doing as well today as yesterday.  Family would like to take pt home with HHPT, but daughter did state "we can take her home as long as she can walk short distances to the bathroom."  Depending on her progress, she may need SNF.  Will continue to assess.  Recommendations for follow up therapy are one component of a multi-disciplinary discharge planning process, led by the attending physician.  Recommendations may be updated based on patient status, additional functional criteria and insurance authorization.  Follow Up Recommendations  Other (comment) (HHPT, if unable to walk short distances, then may need SNF.)     Assistance Recommended at Discharge Frequent or constant Supervision/Assistance  Patient can return home with the following A little help with walking and/or transfers   Equipment Recommendations  None recommended by PT    Recommendations for Other Services       Precautions / Restrictions Precautions Precautions:  Fall Restrictions Weight Bearing Restrictions: No     Mobility  Bed Mobility Overal bed mobility: Needs Assistance Bed Mobility: Supine to Sit Rolling: Max assist Sidelying to sit: Mod assist, Max assist Supine to sit: Min assist     General bed mobility comments: Pt sat EOB for ~60 seconds while she took some pills in apple sauce then had to return supine due to fatigue. Denied dizziness.  While PT and nurse were repositioning her in bed, pt began to vomit multiple times all over the bed.  Multiple rolls back and forth for cleaning and new sheets.    Transfers                   General transfer comment: to fatigued to attempt    Ambulation/Gait                   Stairs             Wheelchair Mobility    Modified Rankin (Stroke Patients Only)       Balance   Sitting-balance support: Feet supported Sitting balance-Leahy Scale: Fair                                      Cognition Arousal/Alertness: Awake/alert Behavior During Therapy: Flat affect                           Following Commands: Follows one step commands with increased time  Exercises      General Comments        Pertinent Vitals/Pain Pain Assessment Pain Assessment: No/denies pain    Home Living                          Prior Function            PT Goals (current goals can now be found in the care plan section) Acute Rehab PT Goals Patient Stated Goal: return home PT Goal Formulation: With patient/family Time For Goal Achievement: 11/25/21 Potential to Achieve Goals: Good Progress towards PT goals: Not progressing toward goals - comment (pt feels poorly today)    Frequency    Min 3X/week      PT Plan Discharge plan needs to be updated    Co-evaluation              AM-PAC PT "6 Clicks" Mobility   Outcome Measure  Help needed turning from your back to your side while in a flat bed  without using bedrails?: A Lot Help needed moving from lying on your back to sitting on the side of a flat bed without using bedrails?: A Lot Help needed moving to and from a bed to a chair (including a wheelchair)?: A Lot Help needed standing up from a chair using your arms (e.g., wheelchair or bedside chair)?: A Lot Help needed to walk in hospital room?: Total Help needed climbing 3-5 steps with a railing? : Total 6 Click Score: 10    End of Session   Activity Tolerance: Patient limited by fatigue Patient left: in bed;with nursing/sitter in room;with family/visitor present   PT Visit Diagnosis: Difficulty in walking, not elsewhere classified (R26.2)     Time: 1400-1435 PT Time Calculation (min) (ACUTE ONLY): 35 min  Charges:  $Therapeutic Activity: 23-37 mins                     Santiago Glad L. Tamala Julian, [place]  11/12/2021    Galen [place] 11/12/2021, 2:46 PM

## 2021-11-12 NOTE — TOC Progression Note (Signed)
Transition of Care [facility]) - Progression Note    Patient Details  Name: [name] MRN: [id number] Date of Birth: [birth year]  [facility] CM/SW [place], LCSW Phone Number: 11/12/2021, 10:40 AM  Clinical Narrative:    Met with pt and confirmed plan to return home with home health services. Pt will be receiving HHPT/OT with [facility]. HH orders will need to be placed prior to discharge. No DME needs identified at this time.    Expected Discharge Plan: [place] Barriers to Discharge: No Barriers Identified  Expected Discharge Plan and Services Expected Discharge Plan: [place] In-house Referral: NA Discharge Planning Services: NA Post Acute Care Choice: [place] arrangements for the past 2 months: Single Family Home                 DME Arranged: N/A DME Agency: NA                   Social Determinants of Health (SDOH) Interventions    Readmission Risk Interventions    11/02/2021   12:11 PM  Readmission Risk Prevention Plan  Transportation Screening Complete  PCP or Specialist Appt within 5-7 Days Complete  Home Care Screening Complete  Medication Review (RN CM) Complete

## 2021-11-13 DIAGNOSIS — N390 Urinary tract infection, site not specified: Secondary | ICD-10-CM | POA: Diagnosis not present

## 2021-11-13 DIAGNOSIS — R7989 Other specified abnormal findings of blood chemistry: Secondary | ICD-10-CM | POA: Diagnosis not present

## 2021-11-13 DIAGNOSIS — G9341 Metabolic encephalopathy: Secondary | ICD-10-CM | POA: Diagnosis not present

## 2021-11-13 DIAGNOSIS — R509 Fever, unspecified: Secondary | ICD-10-CM | POA: Diagnosis not present

## 2021-11-13 LAB — BASIC METABOLIC PANEL
Anion gap: 4 — ABNORMAL LOW (ref 5–15)
BUN: 11 mg/dL (ref 8–23)
CO2: 25 mmol/L (ref 22–32)
Calcium: 8 mg/dL — ABNORMAL LOW (ref 8.9–10.3)
Chloride: 108 mmol/L (ref 98–111)
Creatinine, Ser: 0.76 mg/dL (ref 0.44–1.00)
GFR, Estimated: >60 mL/min (ref >60)
Glucose, Bld: 104 mg/dL — ABNORMAL HIGH (ref 70–99)
Potassium: 4.5 mmol/L (ref 3.5–5.1)
Sodium: 137 mmol/L (ref 135–145)

## 2021-11-13 LAB — CBC
HCT: 40.5 % (ref 36.0–46.0)
Hemoglobin: 13 g/dL (ref 12.0–15.0)
MCH: 33.1 pg (ref 26.0–34.0)
MCHC: 32.1 g/dL (ref 30.0–36.0)
MCV: 103.1 fL — ABNORMAL HIGH (ref 80.0–100.0)
Platelets: 119 10*3/uL — ABNORMAL LOW (ref 150–400)
RBC: 3.93 MIL/uL (ref 3.87–5.11)
RDW: 12.6 % (ref 11.5–15.5)
WBC: 7.4 10*3/uL (ref 4.0–10.5)
nRBC: 0 % (ref 0.0–0.2)

## 2021-11-13 LAB — CULTURE, BLOOD (ROUTINE X 2)

## 2021-11-13 NOTE — Plan of Care (Signed)
  Problem: Education: Goal: Knowledge of risk factors and measures for prevention of condition will improve Outcome: Progressing   Problem: Coping: Goal: Psychosocial and spiritual needs will be supported Outcome: Progressing   Problem: Respiratory: Goal: Will maintain a patent airway Outcome: Progressing Goal: Complications related to the disease process, condition or treatment will be avoided or minimized Outcome: Progressing   Problem: Education: Goal: Knowledge of General Education information will improve Description: Including pain rating scale, medication(s)/side effects and non-pharmacologic comfort measures Outcome: Progressing   Problem: Health Behavior/Discharge Planning: Goal: Ability to manage health-related needs will improve Outcome: Progressing   Problem: Clinical Measurements: Goal: Ability to maintain clinical measurements within normal limits will improve Outcome: Progressing Goal: Will remain free from infection Outcome: Progressing Goal: Diagnostic test results will improve Outcome: Progressing Goal: Respiratory complications will improve Outcome: Progressing Goal: Cardiovascular complication will be avoided Outcome: Progressing   Problem: Nutrition: Goal: Adequate nutrition will be maintained Outcome: Progressing   Problem: Coping: Goal: Level of anxiety will decrease Outcome: Progressing   Problem: Elimination: Goal: Will not experience complications related to bowel motility Outcome: Progressing Goal: Will not experience complications related to urinary retention Outcome: Progressing   Problem: Pain Managment: Goal: General experience of comfort will improve Outcome: Progressing   Problem: Activity: Goal: Risk for activity intolerance will decrease Outcome: Not Progressing

## 2021-11-13 NOTE — Progress Notes (Signed)
PROGRESS NOTE    [name]  WNI:[id number] DOB: April 22, 1931 DOA: 11/10/2021 PCP: Crist Infante, MD    Brief Narrative:  [name] is a 88 y.o. female with past medical history significant for for paroxysmal atrial fibrillation, flutter, hypertension, obstructive sleep apnea, hyperlipidemia, CVA, hypertension, recurrent UTI on suppressive antibiotic treatment who was recently hospitalized for COVID-19 infection and discharged home on prednisone taper presented to hospital with generalized fatigue weakness ambulatory dysfunction and poor oral intake with some fever and chills at home.  Also reported urinary burning micturition.  In the ED, patient was febrile with a temperature of 101.5 F.  Laboratory data showed WBC at 11.2.  BMP showed creatinine of 1.0.  Patient has tested positive for COVID.  Urine drug screen was negative.  UA showed large leukocytes but nitrite was negative.  White cells were 21-50.  Urine culture and blood culture was sent from the ED and patient was admitted to the hospital for further evaluation and treatment..  Assessment and plan. Principal Problem:   Acute metabolic encephalopathy Active Problems:   PAF (paroxysmal atrial fibrillation) (HCC)   History of stroke   Essential hypertension   Hyperlipidemia LDL goal <70   Chronic UTI   Obstructive sleep apnea   COVID-19 virus infection   Abnormal LFTs   Generalized weakness   Fever   Hypokalemia   Acute metabolic encephalopathy Baseline currently.  Temperature max of 100 F.  Continue to monitor.  Urine culture showing Enterococcus.  on Augmentin.  Hypoalbuminemia, poor oral intake.  Continue Ensure supplement.  Encouraged oral intake.  Enterococcal UTI On antibiotic.  Gram-positive bacteremia.  Staph epidermidis.  Likely contaminant.  We will continue to monitor.  Volume depletion.  Improved after IV fluids.  Currently on normal saline 50 mill per hour.  Positive balance at this time and chest x-ray  shows some congestion.  We will discontinue IV fluids.  Hypokalemia.  Improved after replacement.  Latest potassium of 4.5.  Generalized fatigue and weakness. Likely multifactorial factorial secondary to recent COVID illness, deconditioning , hypokalemia, volume depletion.  Physical therapy has seen the patient and recommend home health PT OT on discharge.  UTI. Patient with history of chronic UTI on suppressive Keflex.  Follows up with urology as outpatient.  Urine culture with 30 K colony of enterococcus..  Currently on Augmentin.  We will give 1 dose of fosfomycin prior to discharge.  Recent hospitalization for COVID-19 infection. COVID CT value of 27.6.  Does not have any respiratory symptoms, so off isolation at this time.  Still febrile with low-grade.  Mild cough.  Chest x-ray showed some congestion.   Sciatica. Patient is supposed to see Dr. Kathyrn Sheriff outpatient.  Currently in the hospital.  We will need to follow-up with outpatient.   PAF (paroxysmal atrial fibrillation)  Continue Eliquis.  Rate controlled at this time.   History of stroke No focal deficit. Continue anticoagulation and cholesterol medication.   Essential hypertension Blood pressures were initially low.  Now improved.  Will resume amlodipine.   Hyperlipidemia LDL goal <70. Statin on hold due to elevated LFT.   Obstructive sleep apnea Continue CPAP.      DVT prophylaxis:  apixaban (ELIQUIS) tablet 5 mg   Code Status:     Code Status: Full Code  Disposition: Home with home health as per PT recommendation likely 11/14/2021 if improving fever and continues to clinically improve  Status is: Inpatient  The patient is inpatient because:Deconditioning and weakness,  antibiotic, bacteremia,  Family Communication: Spoke with the patient's daughter at bedside.  Consultants:  None  Procedures:  None  Antimicrobials:  Augmentin 11/12/2021  Anti-infectives (From admission, onward)    Start      Dose/Rate Route Frequency Ordered Stop   11/12/21 1045  amoxicillin-clavulanate (AUGMENTIN) 875-125 MG per tablet 1 tablet        1 tablet Oral 2 times daily 11/12/21 0951     11/11/21 1000  cefTRIAXone (ROCEPHIN) 1 g in sodium chloride 0.9 % 100 mL IVPB  Status:  Discontinued        1 g 200 mL/hr over 30 Minutes Intravenous Every 24 hours 11/10/21 1724 11/12/21 0949   11/10/21 0930  cefTRIAXone (ROCEPHIN) 1 g in sodium chloride 0.9 % 100 mL IVPB        1 g 200 mL/hr over 30 Minutes Intravenous  Once 11/10/21 0917 11/10/21 1140      Subjective: Today, patient was seen and examined at bedside.  Feels better than yesterday.  Has mild cough but no chest pain dizziness.  Low-grade fever noted this morning.  Still feels weak and fatigued.  Objective: Vitals:   11/12/21 1212 11/12/21 1725 11/12/21 2002 11/13/21 0615  BP: (!) 158/69 (!) 154/76 130/74 137/63  Pulse: 64 76 82 70  Resp: '19 17 20 16  '$ Temp: 98.3 F (36.8 C) 99.4 F (37.4 C) 99.7 F (37.6 C) 100 F (37.8 C)  TempSrc: Oral  Oral Oral  SpO2: 99% 95% 97% 91%  Weight:      Height:        Intake/Output Summary (Last 24 hours) at 11/13/2021 1210 Last data filed at 11/13/2021 1057 Gross per 24 hour  Intake 1146.04 ml  Output 301 ml  Net 845.04 ml    Filed Weights   11/10/21 0923  Weight: 68 kg    Physical Examination: Body mass index is 29.29 kg/m.   General:  Average built, not in obvious distress, Communicative, on room air HENT:   No scleral pallor or icterus noted. Oral mucosa is moist.  Chest:  Diminished breath sounds bilaterally.  CVS: S1 &S2 heard. No murmur.  Regular rate and rhythm. Abdomen: Soft, nontender, nondistended.  Bowel sounds are heard.   Extremities: No cyanosis, clubbing or edema.  Peripheral pulses are palpable. Psych: Alert, awake and oriented, normal mood CNS:  No cranial nerve deficits.  Power equal in all extremities.   Skin: Warm and dry.  No rashes noted.   Data Reviewed:    CBC: Recent Labs  Lab 11/10/21 0844 11/11/21 0543 11/12/21 0512 11/13/21 0531  WBC 11.2* 8.6 7.2 7.4  NEUTROABS 8.9* 6.3  --   --   HGB 15.9* 13.9 12.3 13.0  HCT 48.0* 43.2 37.3 40.5  MCV 98.8 102.1* 100.0 103.1*  PLT 133* 118* 106* 119*     Basic Metabolic Panel: Recent Labs  Lab 11/10/21 0844 11/11/21 0543 11/12/21 0512 11/13/21 0531  NA 136 139 137 137  K 4.1 3.8 3.2* 4.5  CL 102 111 108 108  CO2 '27 24 22 25  '$ GLUCOSE 112* 130* 137* 104*  BUN '16 16 13 11  '$ CREATININE 1.01* 0.87 0.61 0.76  CALCIUM 8.1* 7.7* 7.5* 8.0*  MG  --  2.2 2.0  --      Liver Function Tests: Recent Labs  Lab 11/10/21 0844 11/11/21 0543  AST 31 29  ALT 92* 72*  ALKPHOS 48 41  BILITOT 1.7* 0.9  PROT 6.1* 5.4*  ALBUMIN 3.2* 2.7*  Radiology Studies: DG Chest 1 View  Result Date: 11/12/2021 CLINICAL DATA:  Shortness of breath. EXAM: CHEST  1 VIEW COMPARISON:  One-view chest x-ray 11/10/2021 FINDINGS: Patient is rotated the left since the prior exam. This exaggerates the heart size. Mild pulmonary vascular congestion is increased slightly. No focal airspace disease is present. Remote right-sided rib fractures noted. IMPRESSION: Slight increase in mild pulmonary vascular congestion. Electronically Signed   By: San Morelle M.D.   On: 11/12/2021 15:38      LOS: 2 days    Flora Lipps, MD [facility] Available via Epic secure chat 7am-7pm After these hours, please refer to coverage provider listed on [email or web address] 11/13/2021, 12:10 PM

## 2021-11-13 NOTE — Progress Notes (Signed)
Patient declines nocturnal CPAP tonight. She agrees to call should she change her mind and require assistance.

## 2021-11-14 ENCOUNTER — Inpatient Hospital Stay (HOSPITAL_COMMUNITY): Payer: Medicare Other

## 2021-11-14 DIAGNOSIS — R509 Fever, unspecified: Secondary | ICD-10-CM | POA: Diagnosis not present

## 2021-11-14 DIAGNOSIS — N390 Urinary tract infection, site not specified: Secondary | ICD-10-CM | POA: Diagnosis not present

## 2021-11-14 DIAGNOSIS — E876 Hypokalemia: Secondary | ICD-10-CM

## 2021-11-14 DIAGNOSIS — R7989 Other specified abnormal findings of blood chemistry: Secondary | ICD-10-CM | POA: Diagnosis not present

## 2021-11-14 DIAGNOSIS — G9341 Metabolic encephalopathy: Secondary | ICD-10-CM | POA: Diagnosis not present

## 2021-11-14 LAB — RESPIRATORY PANEL BY PCR

## 2021-11-14 MED ORDER — FUROSEMIDE 10 MG/ML IJ SOLN
20.0000 mg | Freq: Once | INTRAMUSCULAR | Status: AC
  Administered 2021-11-14: 20 mg via INTRAVENOUS
  Filled 2021-11-14: qty 2

## 2021-11-14 MED ORDER — METHYLPREDNISOLONE SODIUM SUCC 40 MG IJ SOLR
40.0000 mg | Freq: Two times a day (BID) | INTRAMUSCULAR | Status: DC
  Administered 2021-11-14 – 2021-11-17 (×7): 40 mg via INTRAVENOUS
  Filled 2021-11-14 (×7): qty 1

## 2021-11-14 MED ORDER — GUAIFENESIN 100 MG/5ML PO LIQD
5.0000 mL | ORAL | Status: DC | PRN
  Administered 2021-11-14: 5 mL via ORAL
  Filled 2021-11-14: qty 10

## 2021-11-14 MED ORDER — SODIUM CHLORIDE 0.9 % IV SOLN
3.0000 g | Freq: Four times a day (QID) | INTRAVENOUS | Status: DC
  Administered 2021-11-14 – 2021-11-16 (×6): 3 g via INTRAVENOUS
  Filled 2021-11-14 (×8): qty 8

## 2021-11-14 MED ORDER — IPRATROPIUM-ALBUTEROL 0.5-2.5 (3) MG/3ML IN SOLN
3.0000 mL | Freq: Once | RESPIRATORY_TRACT | Status: AC
  Administered 2021-11-14: 3 mL via RESPIRATORY_TRACT
  Filled 2021-11-14: qty 3

## 2021-11-14 NOTE — Progress Notes (Signed)
PROGRESS NOTE    [name]  ZOX:[id number] DOB: 06-Apr-1931 DOA: 11/10/2021 PCP: Crist Infante, MD    Brief Narrative:  [name] is a 88 y.o. female with past medical history significant for for paroxysmal atrial fibrillation, flutter, hypertension, obstructive sleep apnea, hyperlipidemia, CVA, hypertension, recurrent UTI on suppressive antibiotic treatment who was recently hospitalized for COVID-19 infection and discharged home on prednisone taper presented to hospital with generalized fatigue weakness ambulatory dysfunction and poor oral intake with some fever and chills at home.  Also reported urinary burning micturition.  In the ED, patient was febrile with a temperature of 101.5 F.  Laboratory data showed WBC at 11.2.  BMP showed creatinine of 1.0.  Patient has tested positive for COVID.  Urine drug screen was negative.  UA showed large leukocytes but nitrite was negative.  White cells were 21-50.  Urine culture and blood culture was sent from the ED and patient was admitted to the hospital for further evaluation and treatment.  During hospitalization patient was noted to have enterococcal UTI and was initially on Rocephin which was changed to Augmentin.  She continued to have clinical improvement but has been having recurrent fevers.  She also required a new oxygen with congestion.  Assessment and plan. Principal Problem:   Acute metabolic encephalopathy Active Problems:   PAF (paroxysmal atrial fibrillation) (HCC)   History of stroke   Essential hypertension   Hyperlipidemia LDL goal <70   Chronic UTI   Obstructive sleep apnea   COVID-19 virus infection   Abnormal LFTs   Generalized weakness   Fever   Hypokalemia   Recent hospitalization for COVID-19 infection. Patient however continues to have fever.  Chest x-ray today showed increasing areas of congestion and peribronchial cuffing concerning for bronchitis with possible bilateral bronchopneumonia.  We will continue with  IV diuretics.  We will broaden antibiotic to IV Unasyn.  Check respiratory viral panel.  We will discontinue Augmentin.  Might need repeat CT scan if not improved.  Currently on supplemental oxygen at 1 L/min.  Enterococcal UTI On Augmentin at this time.  Patient continues to have fever.  We will repeat blood cultures.  History of recurrent UTI in the past on suppressive Keflex.  Gram-positive bacteremia.  Staph epidermidis.  Likely contaminant.  We will continue to monitor.  We will repeat blood cultures today due to repeated febrile to 102.5 degrees right.  X-ray today showing bronchopneumonia.  Might need CT scanning if not improved.  Acute metabolic encephalopathy Resolved.  Baseline currently.  Temperature max of 102.5 F..  Continue to monitor.  Urine culture showing Enterococcus.  Currently on Augmentin.  Hypoalbuminemia, poor oral intake.  Continue Ensure supplement.  Encouraged oral intake.  Volume depletion.  Improved after IV fluids.  Has been discontinued due to congestion and hypoxia.  Currently receiving IV Lasix.  Hypokalemia.  Improved.  Generalized fatigue and weakness. Likely multifactorial factorial secondary to recent COVID illness, deconditioning , hypokalemia, volume depletion.  Physical therapy has seen the patient and recommend home health PT OT on discharge.   Sciatica. Patient is supposed to see Dr. Kathyrn Sheriff outpatient.  Currently in the hospital.  We will need to follow-up with outpatient.   PAF (paroxysmal atrial fibrillation)  Continue Eliquis.  Rate controlled at this time.   History of stroke No focal deficit. Continue anticoagulation and cholesterol medication.   Essential hypertension Blood pressures were initially low.  Now improved.  Will resume amlodipine.   Hyperlipidemia LDL goal <70. Statin on  hold due to elevated LFT.   Obstructive sleep apnea Continue CPAP.      DVT prophylaxis:  apixaban (ELIQUIS) tablet 5 mg   Code Status:     Code  Status: Full Code  Disposition:  Home with home health as per PT recommendation, continues to have current fever and needed oxygen supplementation.  Will need further evaluation.    Status is: Inpatient  The patient is inpatient because:Deconditioning and weakness, IV antibiotic, bacteremia,   Family Communication:  I again spoke with the patient's daughter at bedside.  Consultants:  None  Procedures:  None  Antimicrobials:  Augmentin 11/12/2021  Anti-infectives (From admission, onward)    Start     Dose/Rate Route Frequency Ordered Stop   11/12/21 1045  amoxicillin-clavulanate (AUGMENTIN) 875-125 MG per tablet 1 tablet        1 tablet Oral 2 times daily 11/12/21 0951     11/11/21 1000  cefTRIAXone (ROCEPHIN) 1 g in sodium chloride 0.9 % 100 mL IVPB  Status:  Discontinued        1 g 200 mL/hr over 30 Minutes Intravenous Every 24 hours 11/10/21 1724 11/12/21 0949   11/10/21 0930  cefTRIAXone (ROCEPHIN) 1 g in sodium chloride 0.9 % 100 mL IVPB        1 g 200 mL/hr over 30 Minutes Intravenous  Once 11/10/21 0917 11/10/21 1140      Subjective: Today, patient was seen and examined at bedside.  Feels better than yesterday.  Has mild cough but no chest pain dizziness.  Low-grade fever noted this morning.  Still feels weak and fatigued.  Objective: Vitals:   11/13/21 2236 11/13/21 2350 11/14/21 0429 11/14/21 1254  BP:  (!) 137/53 (!) 134/58   Pulse:  78 66 83  Resp:  '20 16 20  '$ Temp:  (!) 100.4 F (38 C) 98.1 F (36.7 C) 99.9 F (37.7 C)  TempSrc:  Oral Oral Oral  SpO2: 96% 96% 94% 95%  Weight:      Height:        Intake/Output Summary (Last 24 hours) at 11/14/2021 1413 Last data filed at 11/14/2021 1200 Gross per 24 hour  Intake --  Output 1700 ml  Net -1700 ml    Filed Weights   11/10/21 0923  Weight: 68 kg    Physical Examination: Body mass index is 29.29 kg/m.   General:  Average built, not in obvious distress, Communicative, on room air HENT:   No  scleral pallor or icterus noted. Oral mucosa is moist.  Chest:  Diminished breath sounds bilaterally.  CVS: S1 &S2 heard. No murmur.  Regular rate and rhythm. Abdomen: Soft, nontender, nondistended.  Bowel sounds are heard.   Extremities: No cyanosis, clubbing or edema.  Peripheral pulses are palpable. Psych: Alert, awake and oriented, normal mood CNS:  No cranial nerve deficits.  Power equal in all extremities.   Skin: Warm and dry.  No rashes noted.   Data Reviewed:   CBC: Recent Labs  Lab 11/10/21 0844 11/11/21 0543 11/12/21 0512 11/13/21 0531  WBC 11.2* 8.6 7.2 7.4  NEUTROABS 8.9* 6.3  --   --   HGB 15.9* 13.9 12.3 13.0  HCT 48.0* 43.2 37.3 40.5  MCV 98.8 102.1* 100.0 103.1*  PLT 133* 118* 106* 119*     Basic Metabolic Panel: Recent Labs  Lab 11/10/21 0844 11/11/21 0543 11/12/21 0512 11/13/21 0531  NA 136 139 137 137  K 4.1 3.8 3.2* 4.5  CL 102 111 108 108  CO2 '27 24 22 25  '$ GLUCOSE 112* 130* 137* 104*  BUN '16 16 13 11  '$ CREATININE 1.01* 0.87 0.61 0.76  CALCIUM 8.1* 7.7* 7.5* 8.0*  MG  --  2.2 2.0  --      Liver Function Tests: Recent Labs  Lab 11/10/21 0844 11/11/21 0543  AST 31 29  ALT 92* 72*  ALKPHOS 48 41  BILITOT 1.7* 0.9  PROT 6.1* 5.4*  ALBUMIN 3.2* 2.7*      Radiology Studies: DG Chest Port 1 View  Result Date: 11/14/2021 CLINICAL DATA:  88 year old female with history of shortness of breath. EXAM: PORTABLE CHEST 1 VIEW COMPARISON:  Chest x-ray 11/12/2021. FINDINGS: Widespread but patchy areas of interstitial prominence and peribronchial cuffing are noted throughout the lungs bilaterally, most severe in the medial aspect of the right lung base and in the periphery of the left upper lobe near the apex. No confluent consolidative airspace disease. No pleural effusions. No pneumothorax. No evidence of pulmonary edema. Heart size is borderline enlarged. Upper mediastinal contours are within normal limits. Atherosclerotic calcifications are  noted in the thoracic aorta. IMPRESSION: 1. Increasing areas of interstitial prominence and peribronchial cuffing asymmetrically distributed in the lungs bilaterally concerning for progressive bronchitis, potentially with developing multilobar bilateral bronchopneumonia. 2. Aortic atherosclerosis. Electronically Signed   By: Vinnie Langton M.D.   On: 11/14/2021 06:31   DG Chest 1 View  Result Date: 11/12/2021 CLINICAL DATA:  Shortness of breath. EXAM: CHEST  1 VIEW COMPARISON:  One-view chest x-ray 11/10/2021 FINDINGS: Patient is rotated the left since the prior exam. This exaggerates the heart size. Mild pulmonary vascular congestion is increased slightly. No focal airspace disease is present. Remote right-sided rib fractures noted. IMPRESSION: Slight increase in mild pulmonary vascular congestion. Electronically Signed   By: San Morelle M.D.   On: 11/12/2021 15:38      LOS: 3 days    Flora Lipps, MD [facility] Available via Epic secure chat 7am-7pm After these hours, please refer to coverage provider listed on [email or web address] 11/14/2021, 2:13 PM

## 2021-11-14 NOTE — Progress Notes (Signed)
Pharmacy Antibiotic Note  [name] is a 88 y.o. female admitted on 11/10/2021 with with past medical history significant for for paroxysmal atrial fibrillation, flutter, hypertension, obstructive sleep apnea, hyperlipidemia, CVA, hypertension, recurrent UTI on suppressive antibiotic treatment who was recently hospitalized for COVID-19 infection and discharged home on prednisone taper presented to hospital with generalized fatigue weakness ambulatory dysfunction and poor oral intake with some fever and chills at home.  Also reported urinary burning micturition.  In the ED, patient was febrile with a temperature of 101.5 F.  Laboratory data showed WBC at 11.2.  BMP showed creatinine of 1.0.  Patient has tested positive for COVID.  Urine drug screen was negative.  UA showed large leukocytes but nitrite was negative.  White cells were 21-50.  Urine culture and blood culture was sent from the ED and patient was admitted to the hospital for further evaluation and treatment.   During hospitalization patient was noted to have enterococcal UTI and was initially on Rocephin which was changed to Augmentin.  She continued to have clinical improvement but has been having recurrent fevers.  She also required a new oxygen with congestion.  Today, Pharmacy has been consulted for Unasyn dosing.  Plan: Unasyn 3 g IV every 6 hours Monitor clinical progress & renal function F/U C&S, abx deescalation / LOT   Height: 5' (152.4 cm) Weight: 68 kg (150 lb) IBW/kg (Calculated) : 45.5  Temp (24hrs), Avg:100.2 F (37.9 C), Min:98.1 F (36.7 C), Max:102.5 F (39.2 C)  Recent Labs  Lab 11/10/21 0844 11/11/21 0543 11/12/21 0512 11/13/21 0531  WBC 11.2* 8.6 7.2 7.4  CREATININE 1.01* 0.87 0.61 0.76    Estimated Creatinine Clearance: 41.8 mL/min (by C-G formula based on SCr of 0.76 mg/dL).    Allergies  Allergen Reactions   Bee Venom Anaphylaxis   Mixed Vespid Venom Anaphylaxis   Codeine Other (See Comments)     Exact allergic reaction not known   Sulfa Antibiotics Itching and Nausea Only   Erythromycin Nausea And Vomiting    Antimicrobials this admission: 10/17 Rocephin >> 10/19 10/19 Augmentin >> 10/21 10/21 Unasyn >>  Microbiology results: 10/21 BCx: sent 10/17 Bcx: 1/3 staph epi, MecA+ - contamination suspected 10/17 UCx: E. Faecalis    Thank you for allowing pharmacy to be a part of this patient's care.  Suzzanne Cloud, PharmD, BCPS 11/14/2021 3:03 PM

## 2021-11-14 NOTE — Progress Notes (Signed)
Rapid Response Event Note   Reason for Call : Temperature 102.5 and sats decreased to 71% on RA   Initial Focused Assessment: Pt A/O and f/c.  Pt on 3 L [place] sats 95%.  Lung sounds decreased in all fields.  .  (Pt wears CPAP at home as well, but not oxygen).  Pulses intact.      Interventions:  Pt given IS and neb treatment. Pt agreed to be placed on CPAP after informed why she really needed. [facility], NP aware and placing orders. RT at bedside.     Plan of Care: pt will remain in current location and continue to be monitored.    Event Summary:   MD Notified: Yes End Time: 00:13  Dyann Ruddle, RN

## 2021-11-14 NOTE — Progress Notes (Signed)
   11/13/21 2221  Assess: MEWS Score  Temp (!) 102.5 F (39.2 C)  BP (!) 162/77  MAP (mmHg) 99  Pulse Rate 86  Resp 16  Level of Consciousness Alert  SpO2 (!) 71 %  O2 Device Room Air  Assess: MEWS Score  MEWS Temp 2  MEWS Systolic 0  MEWS Pulse 0  MEWS RR 0  MEWS LOC 0  MEWS Score 2  MEWS Score Color Yellow  Assess: if the MEWS score is Yellow or Red  Were vital signs taken at a resting state? Yes  Focused Assessment Change from prior assessment (see assessment flowsheet)  Does the patient meet 2 or more of the SIRS criteria? Yes  Does the patient have a confirmed or suspected source of infection? Yes  Provider and Rapid Response Notified? Yes  Notify: Charge Nurse/RN  Name of Charge Nurse/RN Notified Jasen,RN  Date Charge Nurse/RN Notified 11/13/21  Time Charge Nurse/RN Notified 2225  Notify: Provider  Provider Name/Title a. Zebedee Iba  Date Provider Notified 11/13/21  Time Provider Notified 2235  Method of Notification Page  Notification Reason Change in status  Provider response See new orders  Date of Provider Response 11/13/21  Time of Provider Response 0005  Notify: Rapid Response  Name of Rapid Response RN Notified Janelle, RN  Date Rapid Response Notified 11/13/21  Time Rapid Response Notified 2320  Document  Patient Outcome Stabilized after interventions  Progress note created (see row info) Yes  Assess: SIRS CRITERIA  SIRS Temperature  1  SIRS Pulse 0  SIRS Respirations  0  SIRS WBC 1  SIRS Score Sum  2   Gave pt tylenol, placed on O2 started at 2L stat went tp 94 but then dropped to 90, increased to 3L. Stable at 95%. Placed ice packs on back of neck and bilateral axillary, removed blankets and turned temp. Down in room. Informed On call. Rechecked temp about 35 min later temp remained 102.6. called Rapid and Janell RN came up did an assessment. Gave pt IS with teach back 1000 x5.This New orders provided, RT came and admin neb treatment, and placed CPAP. On  cal at bed side pt feels better.

## 2021-11-15 ENCOUNTER — Encounter (HOSPITAL_COMMUNITY): Payer: Self-pay | Admitting: Internal Medicine

## 2021-11-15 ENCOUNTER — Inpatient Hospital Stay (HOSPITAL_COMMUNITY): Payer: Medicare Other

## 2021-11-15 DIAGNOSIS — R509 Fever, unspecified: Secondary | ICD-10-CM | POA: Diagnosis not present

## 2021-11-15 DIAGNOSIS — Z87891 Personal history of nicotine dependence: Secondary | ICD-10-CM

## 2021-11-15 DIAGNOSIS — U071 COVID-19: Secondary | ICD-10-CM | POA: Diagnosis not present

## 2021-11-15 DIAGNOSIS — R531 Weakness: Secondary | ICD-10-CM | POA: Diagnosis not present

## 2021-11-15 DIAGNOSIS — N390 Urinary tract infection, site not specified: Secondary | ICD-10-CM | POA: Diagnosis not present

## 2021-11-15 DIAGNOSIS — J189 Pneumonia, unspecified organism: Secondary | ICD-10-CM

## 2021-11-15 DIAGNOSIS — I10 Essential (primary) hypertension: Secondary | ICD-10-CM | POA: Diagnosis not present

## 2021-11-15 DIAGNOSIS — G9341 Metabolic encephalopathy: Secondary | ICD-10-CM | POA: Diagnosis not present

## 2021-11-15 HISTORY — DX: Fever, unspecified: R50.9

## 2021-11-15 LAB — CBC
HCT: 37.6 % (ref 36.0–46.0)
Hemoglobin: 12.5 g/dL (ref 12.0–15.0)
MCH: 32.9 pg (ref 26.0–34.0)
MCHC: 33.2 g/dL (ref 30.0–36.0)
MCV: 98.9 fL (ref 80.0–100.0)
Platelets: 161 10*3/uL (ref 150–400)
RBC: 3.8 MIL/uL — ABNORMAL LOW (ref 3.87–5.11)
RDW: 12.1 % (ref 11.5–15.5)
WBC: 5.5 10*3/uL (ref 4.0–10.5)
nRBC: 0 % (ref 0.0–0.2)

## 2021-11-15 LAB — MAGNESIUM: Magnesium: 2.5 mg/dL — ABNORMAL HIGH (ref 1.7–2.4)

## 2021-11-15 LAB — BASIC METABOLIC PANEL
Anion gap: 7 (ref 5–15)
BUN: 22 mg/dL (ref 8–23)
CO2: 28 mmol/L (ref 22–32)
Calcium: 8.6 mg/dL — ABNORMAL LOW (ref 8.9–10.3)
Chloride: 104 mmol/L (ref 98–111)
Creatinine, Ser: 0.8 mg/dL (ref 0.44–1.00)
GFR, Estimated: >60 mL/min (ref >60)
Glucose, Bld: 193 mg/dL — ABNORMAL HIGH (ref 70–99)
Potassium: 3.7 mmol/L (ref 3.5–5.1)
Sodium: 139 mmol/L (ref 135–145)

## 2021-11-15 LAB — CULTURE, BLOOD (ROUTINE X 2)
Culture: NO GROWTH
Special Requests: ADEQUATE

## 2021-11-15 LAB — C-REACTIVE PROTEIN: CRP: 15.8 mg/dL — ABNORMAL HIGH (ref <1.0)

## 2021-11-15 MED ORDER — SODIUM CHLORIDE (PF) 0.9 % IJ SOLN
INTRAMUSCULAR | Status: AC
  Filled 2021-11-15: qty 50

## 2021-11-15 MED ORDER — IOHEXOL 300 MG/ML  SOLN
100.0000 mL | Freq: Once | INTRAMUSCULAR | Status: AC | PRN
  Administered 2021-11-15: 100 mL via INTRAVENOUS

## 2021-11-15 MED ORDER — IOHEXOL 9 MG/ML PO SOLN
ORAL | Status: AC
  Administered 2021-11-15: 500 mL
  Filled 2021-11-15: qty 1000

## 2021-11-15 NOTE — Progress Notes (Addendum)
PROGRESS NOTE    [name]  KXF:[id number] DOB: Jun 10, 1931 DOA: 11/10/2021 PCP: Crist Infante, MD    Brief Narrative:  [name] is a 88 y.o. female with past medical history significant for for paroxysmal atrial fibrillation, flutter, hypertension, obstructive sleep apnea, hyperlipidemia, CVA, hypertension, recurrent UTI on suppressive antibiotic treatment who was recently hospitalized for COVID-19 infection and discharged home on prednisone taper presented to hospital with generalized fatigue weakness ambulatory dysfunction and poor oral intake with some fever and chills at home.  Also reported urinary burning micturition.  In the ED, patient was febrile with a temperature of 101.5 F.  Laboratory data showed WBC at 11.2.  BMP showed creatinine of 1.0.  Patient has tested positive for COVID.  Urine drug screen was negative.  UA showed large leukocytes but nitrite was negative.  White cells were 21-50.  Urine culture and blood culture was sent from the ED and patient was admitted to the hospital for further evaluation and treatment.  During hospitalization, patient was noted to have enterococcus in urine and was initially on Rocephin which was changed to Augmentin.  Patient however continued to have recurrent fever and hypoxia.  I have spoken with ID for consultation.    Assessment and plan. Principal Problem:   Acute metabolic encephalopathy Active Problems:   PAF (paroxysmal atrial fibrillation) (HCC)   History of stroke   Essential hypertension   Hyperlipidemia LDL goal <70   Chronic UTI   Obstructive sleep apnea   COVID-19 virus infection   Abnormal LFTs   Generalized weakness   Fever   Hypokalemia   Recurrent fever, recent hospitalization for COVID-19 infection.  Temperature max of 102.2 F yesterday afternoon..  Chest x-ray today showed increasing areas of congestion and peribronchial cuffing concerning for bronchitis with possible bilateral bronchopneumonia.  Currently on  IV Unasyn for possible post-COVID pneumonia.  Has been started on IV steroids. Respiratory viral panel was negative. Currently on supplemental oxygen at 1 L/min.  Patient however feels better but due to recurrent fever we will get a CT scan of the chest abdomen and pelvis today.  I spoke with infectious disease Dr. Drucilla Schmidt about it.  There is possibility of persistent/rebound COVID infection.  We will continue to rule out bacterial causes.  We will initiate airborne precautions at this time.  Communicated with the nursing staff.  Patient had been previously treated with antivirals and steroids.  She was discharged home after hospital treatment on 11/02/2021.  We will add baseline procalcitonin and CRP for the morning.    UTI.  Urine showed 30 K Enterococcus and was treated with Rocephin initially and subsequently Augmentin.  History of recurrent UTI in the past on suppressive Keflex.  Gram-positive bacteremia.  Staph epidermidis.  Likely contaminant.  Repeat blood cultures were performed due to fever and negative in less than 24 hours.[name] today showing bronchopneumonia.  We will get CT scan of the chest abdomen and pelvis at this time to rule out other source of infection.  Acute metabolic encephalopathy Resolved.  Baseline currently.     Hypoalbuminemia, poor oral intake.  Continue Ensure supplement.    Volume depletion.  Resolved.  With IV Lasix intermittently for subsequent volume overload.  Hypokalemia.  Improved.  Latest potassium of 3.7.  Generalized fatigue and weakness. Likely multifactorial factorial secondary to recent COVID illness, deconditioning , hypokalemia.  Physical therapy has seen the patient and recommend home health PT OT on discharge.   Sciatica. Patient is supposed  to see Dr. Kathyrn Sheriff outpatient.  Currently in the hospital.  We will need to follow-up with outpatient.  Denies active issues at this time.   PAF (paroxysmal atrial fibrillation)  Continue Eliquis.  Rate  controlled at this time.   History of stroke No focal deficit. Continue Eliquis.   Essential hypertension Blood pressure stable on amlodipine.   Hyperlipidemia LDL goal <70. Statin on hold at this time due to elevated LFT   Obstructive sleep apnea Continue CPAP.      DVT prophylaxis:  apixaban (ELIQUIS) tablet 5 mg   Code Status:     Code Status: Full Code  Disposition:  Home with home health as per latest PT recommendation  Status is: Inpatient  The patient is inpatient because:Deconditioning and weakness, IV antibiotic, recurrent fever, further work-up.   Family Communication:  Spoke with the patient's daughter at bedside at length about the plan for fever work-up ID consultation and updated her.  Consultants:  None  Procedures:  None  Antimicrobials:  Unasyn 11/14/2021  Anti-infectives (From admission, onward)    Start     Dose/Rate Route Frequency Ordered Stop   11/14/21 1600  Ampicillin-Sulbactam (UNASYN) 3 g in sodium chloride 0.9 % 100 mL IVPB        3 g 200 mL/hr over 30 Minutes Intravenous Every 6 hours 11/14/21 1444     11/12/21 1045  amoxicillin-clavulanate (AUGMENTIN) 875-125 MG per tablet 1 tablet  Status:  Discontinued        1 tablet Oral 2 times daily 11/12/21 0951 11/14/21 1426   11/11/21 1000  cefTRIAXone (ROCEPHIN) 1 g in sodium chloride 0.9 % 100 mL IVPB  Status:  Discontinued        1 g 200 mL/hr over 30 Minutes Intravenous Every 24 hours 11/10/21 1724 11/12/21 0949   11/10/21 0930  cefTRIAXone (ROCEPHIN) 1 g in sodium chloride 0.9 % 100 mL IVPB        1 g 200 mL/hr over 30 Minutes Intravenous  Once 11/10/21 0917 11/10/21 1140      Subjective: Today, patient was seen and examined at bedside.  Continues to feel better than yesterday.  Has less cough does have fever of 102 yesterday.  Denies any nausea vomiting chills or rigor.  Has some urinary discomfort  Objective: Vitals:   11/14/21 1656 11/14/21 2021 11/15/21 0501 11/15/21 0940   BP:  129/69 103/62 130/69  Pulse:  68 66 62  Resp:  '18 18 18  '$ Temp: 99.2 F (37.3 C) 98.3 F (36.8 C) (!) 97.4 F (36.3 C) 97.8 F (36.6 C)  TempSrc: Oral Oral  Oral  SpO2:  98% 96% 97%  Weight:      Height:        Intake/Output Summary (Last 24 hours) at 11/15/2021 1204 Last data filed at 11/15/2021 0900 Gross per 24 hour  Intake 800 ml  Output --  Net 800 ml    Filed Weights   11/10/21 0923  Weight: 68 kg    Physical Examination: Body mass index is 29.29 kg/m.   General:  Average built, not in obvious distress, Communicative, on nasal oxygen HENT:   No scleral pallor or icterus noted. Oral mucosa is moist.  Chest:  Diminished breath sounds bilaterally.  Some basal crackles noted. CVS: S1 &S2 heard. No murmur.  Regular rate and rhythm. Abdomen: Soft, nontender, nondistended.  Bowel sounds are heard.   Extremities: No cyanosis, clubbing or edema.  Peripheral pulses are palpable. Psych: Alert, awake and oriented,  normal mood CNS:  No cranial nerve deficits.  Power equal in all extremities.   Skin: Warm and dry.  No rashes noted.  Data Reviewed:   CBC: Recent Labs  Lab 11/10/21 0844 11/11/21 0543 11/12/21 0512 11/13/21 0531 11/15/21 0542  WBC 11.2* 8.6 7.2 7.4 5.5  NEUTROABS 8.9* 6.3  --   --   --   HGB 15.9* 13.9 12.3 13.0 12.5  HCT 48.0* 43.2 37.3 40.5 37.6  MCV 98.8 102.1* 100.0 103.1* 98.9  PLT 133* 118* 106* 119* 161     Basic Metabolic Panel: Recent Labs  Lab 11/10/21 0844 11/11/21 0543 11/12/21 0512 11/13/21 0531 11/15/21 0542  NA 136 139 137 137 139  K 4.1 3.8 3.2* 4.5 3.7  CL 102 111 108 108 104  CO2 '27 24 22 25 28  '$ GLUCOSE 112* 130* 137* 104* 193*  BUN '16 16 13 11 22  '$ CREATININE 1.01* 0.87 0.61 0.76 0.80  CALCIUM 8.1* 7.7* 7.5* 8.0* 8.6*  MG  --  2.2 2.0  --  2.5*     Liver Function Tests: Recent Labs  Lab 11/10/21 0844 11/11/21 0543  AST 31 29  ALT 92* 72*  ALKPHOS 48 41  BILITOT 1.7* 0.9  PROT 6.1* 5.4*  ALBUMIN  3.2* 2.7*      Radiology Studies: DG Chest Port 1 View  Result Date: 11/14/2021 CLINICAL DATA:  88 year old female with history of shortness of breath. EXAM: PORTABLE CHEST 1 VIEW COMPARISON:  Chest x-ray 11/12/2021. FINDINGS: Widespread but patchy areas of interstitial prominence and peribronchial cuffing are noted throughout the lungs bilaterally, most severe in the medial aspect of the right lung base and in the periphery of the left upper lobe near the apex. No confluent consolidative airspace disease. No pleural effusions. No pneumothorax. No evidence of pulmonary edema. Heart size is borderline enlarged. Upper mediastinal contours are within normal limits. Atherosclerotic calcifications are noted in the thoracic aorta. IMPRESSION: 1. Increasing areas of interstitial prominence and peribronchial cuffing asymmetrically distributed in the lungs bilaterally concerning for progressive bronchitis, potentially with developing multilobar bilateral bronchopneumonia. 2. Aortic atherosclerosis. Electronically Signed   By: Vinnie Langton M.D.   On: 11/14/2021 06:31      LOS: 4 days    Flora Lipps, MD [facility] Available via Epic secure chat 7am-7pm After these hours, please refer to coverage provider listed on [email or web address] 11/15/2021, 12:04 PM

## 2021-11-15 NOTE — Consult Note (Signed)
Date of Admission:  11/10/2021          Reason for Consult: FUO    Referring Provider: Laurena Slimmer, MD   Assessment:  Recent COVID 19 infection post remdesivir and steroids now still positive with cycle threshold in the upper 20s Readmission with fevers and encephalopathy on October 17 Suspect she has post COVID bacterial pneumonia +/- COVID rebound Dysuria but unimpressive colony count of enterococcus in urine culture For coccus epidermidis in 1 of 2 cultures on admission equals contaminant Paroxysmal atrial fibrillation  Plan:  Continue Unasyn for now Repeat COVID PCR and check cycle threshold Airborne precautions CT chest abdomen pelvis  Dr. Gale Journey will be here tomorrow.  Principal Problem:   Acute metabolic encephalopathy Active Problems:   PAF (paroxysmal atrial fibrillation) (HCC)   History of stroke   Essential hypertension   Hyperlipidemia LDL goal <70   Chronic UTI   Obstructive sleep apnea   COVID-19 virus infection   Abnormal LFTs   Generalized weakness   Fever   Hypokalemia   FUO (fever of unknown origin)   Scheduled Meds:  amLODipine  2.5 mg Oral Daily   apixaban  5 mg Oral BID   ascorbic acid  500 mg Oral Daily   feeding supplement  237 mL Oral BID BM   methylPREDNISolone (SOLU-MEDROL) injection  40 mg Intravenous BID   polycarbophil  625 mg Oral Daily   saccharomyces boulardii  250 mg Oral Daily   zinc sulfate  220 mg Oral Daily   Continuous Infusions:  ampicillin-sulbactam (UNASYN) IV 3 g (11/15/21 1154)   PRN Meds:.acetaminophen **OR** acetaminophen, guaiFENesin, Ipratropium-Albuterol, ondansetron **OR** ondansetron (ZOFRAN) IV, oxyCODONE  HPI: [name] is a 88 y.o. female past medical history significant for paroxysmal atrial fibrillation tachybradycardia syndrome status post ablation hyperlipidemia emphysema hypertension nonhemorrhagic CVA and pseudoaneurysm of the femoral artery who presented to the ER earlier this month  with shoulder pain and lower back pain with sciatica.  During that presentation she was found to be hypoxic and COVID-19 infection.  She was given steroids along with remdesivir and improved and was discharged to home.  She had been doing well until last week when Monday she was profoundly weak and lethargic.  By the following day she was becoming increasingly encephalopathic and her family were worried that if they did not bring her to the hospital that she might die.  She was brought to the hospital and still was confused.  She had mild leukocytosis.  She still tested positive for COVID-19 though cycle threshold was in the upper 20s.  UA had shown some leukocytes nitrate negative white cells 21-50.  She had had some antecedent dysuria though it seems as if she may have chronic dysuria.  Cultures were sent which grew only 30,000 colony-forming units of Enterococcus faecalis.  She had been on ceftriaxone already for 4 days and was changed over to Unasyn yesterday.  He had blood cultures taken on admission with 1 out of 2 sites with Staphylococcus epidermidis and the other one not yielding an organism.  She had a respiratory panel that was otherwise negative other than the COVID-19 that did not identified.  Chest x-ray done had shown evidence of lateral infiltrates concerning for bronchopneumonia.  Improved clinically and is more lucid now.  She still has some occasional lower abdominal pain but it is fairly faint.  She is however continuing to have fevers 102.2 when measured yesterday.  I suspect that she  either has a post-COVID bacterial pneumonia plus or minus COVID rebound.  Do think it is prudent to get a CT chest abdomen pelvis to look for occult abscess or complications related to pneumonia.  I will continue her Unasyn and I am instituting airborne precautions given that her cycle threshold was still in the 20s and she has ongoing fevers and respiratory symptoms.  I spent 82  minutes with the patient including than 50% of the time in face to face counseling of the patient and her daughter regarding the FUO work-up her history of COVID-19 infection pneumonia, personally reviewing his x-ray along with review of medical records in preparation for the visit and during the visit and in coordination of her care.    Review of Systems: Review of Systems  Unable to perform ROS: Medical condition    Past Medical History:  Diagnosis Date   Atrial fibrillation [facility])    Prior treatment with amiodarone, held sinus rhythm and drug stopped   Atrial flutter ([place])    Status post ablation prior to  2009   Chest pain    Nuclear, 2004, no scar or ischemia, EF 77%   Ejection fraction    EF 70%, nuclear,  2004  //      Fall    July, 2014   FUO (fever of unknown origin) 11/15/2021   H/O bladder repair surgery    OSA (obstructive sleep apnea)    Right Achilles tendinitis    Sciatica     Social History   Tobacco Use   Smoking status: Former    Packs/day: 1.00    Years: 10.00    Total pack years: 10.00    Types: Cigarettes   Smokeless tobacco: Never  Vaping Use   Vaping Use: Never used  Substance Use Topics   Alcohol use: Yes   Drug use: Never    Family History  Problem Relation Age of Onset   Cancer Mother    Cancer Father        pancreatic cancer   Heart disease Brother    Allergies  Allergen Reactions   Bee Venom Anaphylaxis   Mixed Vespid Venom Anaphylaxis   Codeine Other (See Comments)    Exact allergic reaction not known   Sulfa Antibiotics Itching and Nausea Only   Erythromycin Nausea And Vomiting    OBJECTIVE: Blood pressure 130/69, pulse 62, temperature 97.8 F (36.6 C), temperature source Oral, resp. rate 18, height 5' (1.524 m), weight 68 kg, SpO2 97 %.  Physical Exam Constitutional:      General: She is not in acute distress.    Appearance: Normal appearance. She is well-developed. She is not ill-appearing or diaphoretic.  HENT:      Head: Normocephalic and atraumatic.     Right Ear: Hearing and external ear normal.     Left Ear: Hearing and external ear normal.     Nose: No nasal deformity or rhinorrhea.  Eyes:     General: No scleral icterus.    Extraocular Movements: Extraocular movements intact.     Conjunctiva/sclera: Conjunctivae normal.     Right eye: Right conjunctiva is not injected.     Left eye: Left conjunctiva is not injected.     Pupils: Pupils are equal, round, and reactive to light.  Neck:     Vascular: No JVD.  Cardiovascular:     Rate and Rhythm: Normal rate. Rhythm irregular.     Heart sounds: S1 normal and S2 normal.  Pulmonary:  Effort: Pulmonary effort is normal. No respiratory distress.     Breath sounds: No wheezing.  Abdominal:     General: There is no distension.     Palpations: Abdomen is soft. There is no mass.     Tenderness: There is no abdominal tenderness. There is no rebound.  Musculoskeletal:        General: Normal range of motion.     Right shoulder: Normal.     Left shoulder: Normal.     Cervical back: Normal range of motion and neck supple.     Right hip: Normal.     Left hip: Normal.     Right knee: Normal.     Left knee: Normal.  Lymphadenopathy:     Head:     Right side of head: No submandibular, preauricular or posterior auricular adenopathy.     Left side of head: No submandibular, preauricular or posterior auricular adenopathy.     Cervical: No cervical adenopathy.     Right cervical: No superficial or deep cervical adenopathy.    Left cervical: No superficial or deep cervical adenopathy.  Skin:    General: Skin is warm and dry.     Coloration: Skin is not pale.     Findings: No abrasion, bruising, ecchymosis, erythema, lesion or rash.     Nails: There is no clubbing.  Neurological:     General: No focal deficit present.     Mental Status: She is alert and oriented to person, place, and time.  Psychiatric:        Attention and Perception: She is  attentive.        Mood and Affect: Mood normal.        Speech: Speech normal.        Behavior: Behavior normal. Behavior is cooperative.        Thought Content: Thought content normal.        Judgment: Judgment normal.     Lab Results Lab Results  Component Value Date   WBC 5.5 11/15/2021   HGB 12.5 11/15/2021   HCT 37.6 11/15/2021   MCV 98.9 11/15/2021   PLT 161 11/15/2021    Lab Results  Component Value Date   CREATININE 0.80 11/15/2021   BUN 22 11/15/2021   NA 139 11/15/2021   K 3.7 11/15/2021   CL 104 11/15/2021   CO2 28 11/15/2021    Lab Results  Component Value Date   ALT 72 (H) 11/11/2021   AST 29 11/11/2021   ALKPHOS 41 11/11/2021   BILITOT 0.9 11/11/2021     Microbiology: Recent Results (from the past 240 hour(s))  Resp Panel by RT-PCR (Flu A&B, Covid) Anterior Nasal Swab     Status: Abnormal   Collection Time: 11/10/21  8:43 AM   Specimen: Anterior Nasal Swab  Result Value Ref Range Status   SARS Coronavirus 2 by RT PCR POSITIVE (A) NEGATIVE Final    Comment: (NOTE) SARS-CoV-2 target nucleic acids are DETECTED.  The SARS-CoV-2 RNA is generally detectable in upper respiratory specimens during the acute phase of infection. Positive results are indicative of the presence of the identified virus, but do not rule out bacterial infection or co-infection with other pathogens not detected by the test. Clinical correlation with patient history and other diagnostic information is necessary to determine patient infection status. The expected result is Negative.  Fact Sheet for Patients: EntrepreneurPulse.com.au  Fact Sheet for Healthcare Providers: IncredibleEmployment.be  This test is not yet approved or cleared by the  [place] Architectural technologist and  has been authorized for detection and/or diagnosis of SARS-CoV-2 by FDA under an Print production planner (EUA).  This EUA will remain in effect (meaning this test can be used)  for the duration of  the COVID-19 declaration under Section 564(b)(1) of the A ct, 21 U.S.C. section 360bbb-3(b)(1), unless the authorization is terminated or revoked sooner.     Influenza A by PCR NEGATIVE NEGATIVE Final   Influenza B by PCR NEGATIVE NEGATIVE Final    Comment: (NOTE) The Xpert Xpress SARS-CoV-2/FLU/RSV plus assay is intended as an aid in the diagnosis of influenza from Nasopharyngeal swab specimens and should not be used as a sole basis for treatment. Nasal washings and aspirates are unacceptable for Xpert Xpress SARS-CoV-2/FLU/RSV testing.  Fact Sheet for Patients: EntrepreneurPulse.com.au  Fact Sheet for Healthcare Providers: IncredibleEmployment.be  This test is not yet approved or cleared by the [place] FDA and has been authorized for detection and/or diagnosis of SARS-CoV-2 by FDA under an Emergency Use Authorization (EUA). This EUA will remain in effect (meaning this test can be used) for the duration of the COVID-19 declaration under Section 564(b)(1) of the Act, 21 U.S.C. section 360bbb-3(b)(1), unless the authorization is terminated or revoked.  Performed at [facility], [place] [address] [place], [place] [address]   Culture, blood (routine x 2)     Status: None   Collection Time: 11/10/21  9:04 AM   Specimen: BLOOD  Result Value Ref Range Status   Specimen Description   Final    BLOOD LEFT ANTECUBITAL Performed at [facility] [address] [place], [place] [address]    Special Requests   Final    BOTTLES DRAWN AEROBIC AND ANAEROBIC Blood Culture adequate volume Performed at [facility] [address] [place], [place] [address]    Culture   Final    NO GROWTH 5 DAYS Performed at [facility], [place] [address], [place], [place] [address]    Report Status 11/15/2021 FINAL  Final  Culture, blood (routine x 2)     Status: Abnormal    Collection Time: 11/10/21  9:05 AM   Specimen: BLOOD  Result Value Ref Range Status   Specimen Description   Final    BLOOD LEFT ANTECUBITAL Performed at [facility] [address], [place], [place] [address]    Special Requests   Final    BOTTLES DRAWN AEROBIC ONLY Blood Culture results may not be optimal due to an inadequate volume of blood received in culture bottles Performed at [facility] [address] [place], [place] [address]    Culture  Setup Time   Final    GRAM POSITIVE COCCI IN CLUSTERS AEROBIC BOTTLE ONLY CRITICAL RESULT CALLED TO, READ BACK BY AND VERIFIED WITH: PHARMD E JACKSON 11/12/21 '@107'$  BY AB    Culture (A)  Final    STAPHYLOCOCCUS EPIDERMIDIS THE SIGNIFICANCE OF ISOLATING THIS ORGANISM FROM A SINGLE SET OF BLOOD CULTURES WHEN MULTIPLE SETS ARE DRAWN IS UNCERTAIN. PLEASE NOTIFY THE MICROBIOLOGY DEPARTMENT WITHIN ONE WEEK IF SPECIATION AND SENSITIVITIES ARE REQUIRED. Performed at [facility], [place] [address] [place], [place] [address]    Report Status 11/13/2021 FINAL  Final  Blood Culture ID Panel (Reflexed)     Status: Abnormal   Collection Time: 11/10/21  9:05 AM  Result Value Ref Range Status   Enterococcus faecalis NOT DETECTED NOT DETECTED Final   Enterococcus Faecium NOT  DETECTED NOT DETECTED Final   Listeria monocytogenes NOT DETECTED NOT DETECTED Final   Staphylococcus species DETECTED (A) NOT DETECTED Final    Comment: CRITICAL RESULT CALLED TO, READ BACK BY AND VERIFIED WITH: PHARMD E JACKSON 11/12/21 '@107'$  BY AB    Staphylococcus aureus (BCID) NOT DETECTED NOT DETECTED Final   Staphylococcus epidermidis DETECTED (A) NOT DETECTED Final    Comment: Methicillin (oxacillin) resistant coagulase negative staphylococcus. Possible blood culture contaminant (unless isolated from more than one blood culture draw or clinical case suggests pathogenicity). No antibiotic treatment is indicated for blood  culture  contaminants. CRITICAL RESULT CALLED TO, READ BACK BY AND VERIFIED WITH: PHARMD E JACKSON 11/12/21 '@107'$  BY AB    Staphylococcus lugdunensis NOT DETECTED NOT DETECTED Final   Streptococcus species NOT DETECTED NOT DETECTED Final   Streptococcus agalactiae NOT DETECTED NOT DETECTED Final   Streptococcus pneumoniae NOT DETECTED NOT DETECTED Final   Streptococcus pyogenes NOT DETECTED NOT DETECTED Final   A.calcoaceticus-baumannii NOT DETECTED NOT DETECTED Final   Bacteroides fragilis NOT DETECTED NOT DETECTED Final   Enterobacterales NOT DETECTED NOT DETECTED Final   Enterobacter cloacae complex NOT DETECTED NOT DETECTED Final   Escherichia coli NOT DETECTED NOT DETECTED Final   Klebsiella aerogenes NOT DETECTED NOT DETECTED Final   Klebsiella oxytoca NOT DETECTED NOT DETECTED Final   Klebsiella pneumoniae NOT DETECTED NOT DETECTED Final   Proteus species NOT DETECTED NOT DETECTED Final   Salmonella species NOT DETECTED NOT DETECTED Final   Serratia marcescens NOT DETECTED NOT DETECTED Final   Haemophilus influenzae NOT DETECTED NOT DETECTED Final   Neisseria meningitidis NOT DETECTED NOT DETECTED Final   Pseudomonas aeruginosa NOT DETECTED NOT DETECTED Final   Stenotrophomonas maltophilia NOT DETECTED NOT DETECTED Final   Candida albicans NOT DETECTED NOT DETECTED Final   Candida auris NOT DETECTED NOT DETECTED Final   Candida glabrata NOT DETECTED NOT DETECTED Final   Candida krusei NOT DETECTED NOT DETECTED Final   Candida parapsilosis NOT DETECTED NOT DETECTED Final   Candida tropicalis NOT DETECTED NOT DETECTED Final   Cryptococcus neoformans/gattii NOT DETECTED NOT DETECTED Final   Methicillin resistance mecA/C DETECTED (A) NOT DETECTED Final    Comment: CRITICAL RESULT CALLED TO, READ BACK BY AND VERIFIED WITH: PHARMD E JACKSON 11/12/21 '@107'$  BY AB Performed at [facility], [address], [place], [place] [address]   Urine Culture     Status: Abnormal   Collection  Time: 11/10/21 12:17 PM   Specimen: Urine, Clean Catch  Result Value Ref Range Status   Specimen Description   Final    URINE, CLEAN CATCH Performed at [facility], [place] [address], [place], [place] [address]    Special Requests   Final    NONE Performed at [facility], [place] [address], [place], [place] [address]    Culture 30,000 COLONIES/mL ENTEROCOCCUS FAECALIS (A)  Final   Report Status 11/12/2021 FINAL  Final   Organism ID, Bacteria ENTEROCOCCUS FAECALIS (A)  Final      Susceptibility   Enterococcus faecalis - MIC*    AMPICILLIN <=2 SENSITIVE Sensitive     NITROFURANTOIN <=16 SENSITIVE Sensitive     VANCOMYCIN 1 SENSITIVE Sensitive     * 30,000 COLONIES/mL ENTEROCOCCUS FAECALIS  Respiratory (~20 pathogens) panel by PCR     Status: None   Collection Time: 11/14/21 10:21 AM   Specimen: Nasopharyngeal Swab; Respiratory  Result Value Ref Range Status   Adenovirus NOT DETECTED NOT DETECTED Final  Coronavirus 229E NOT DETECTED NOT DETECTED Final    Comment: (NOTE) The Coronavirus on the Respiratory Panel, DOES NOT test for the novel  Coronavirus (2019 nCoV)    Coronavirus HKU1 NOT DETECTED NOT DETECTED Final   Coronavirus NL63 NOT DETECTED NOT DETECTED Final   Coronavirus OC43 NOT DETECTED NOT DETECTED Final   Metapneumovirus NOT DETECTED NOT DETECTED Final   Rhinovirus / Enterovirus NOT DETECTED NOT DETECTED Final   Influenza A NOT DETECTED NOT DETECTED Final   Influenza B NOT DETECTED NOT DETECTED Final   Parainfluenza Virus 1 NOT DETECTED NOT DETECTED Final   Parainfluenza Virus 2 NOT DETECTED NOT DETECTED Final   Parainfluenza Virus 3 NOT DETECTED NOT DETECTED Final   Parainfluenza Virus 4 NOT DETECTED NOT DETECTED Final   Respiratory Syncytial Virus NOT DETECTED NOT DETECTED Final   Bordetella pertussis NOT DETECTED NOT DETECTED Final   Bordetella Parapertussis NOT DETECTED NOT DETECTED Final   Chlamydophila pneumoniae NOT  DETECTED NOT DETECTED Final   Mycoplasma pneumoniae NOT DETECTED NOT DETECTED Final    Comment: Performed at [facility], [place] [address] [place], [place] [address]  Culture, blood (Routine X 2) w Reflex to ID Panel     Status: None (Preliminary result)   Collection Time: 11/14/21 11:19 AM   Specimen: BLOOD  Result Value Ref Range Status   Specimen Description   Final    BLOOD RIGHT ANTECUBITAL Performed at [facility] [address], [place], [place] [address]    Special Requests   Final    BOTTLES DRAWN AEROBIC ONLY Blood Culture adequate volume Performed at [facility] [address], [place], [place] [address]    Culture   Final    NO GROWTH < 24 HOURS Performed at [facility] [address], [place], [place] [address]    Report Status PENDING  Incomplete  Culture, blood (Routine X 2) w Reflex to ID Panel     Status: None (Preliminary result)   Collection Time: 11/14/21 11:19 AM   Specimen: BLOOD  Result Value Ref Range Status   Specimen Description   Final    BLOOD BLOOD RIGHT HAND Performed at [facility] [address] [place], [place] [address]    Special Requests   Final    BOTTLES DRAWN AEROBIC ONLY Blood Culture adequate volume Performed at [facility] [address], [place], [place] [address]    Culture   Final    NO GROWTH < 24 HOURS Performed at [facility] [address] [place], [place] [address]    Report Status PENDING  Incomplete    Alcide Evener, [place] [facility] [phone] pager  11/15/2021, 12:35 PM

## 2021-11-15 NOTE — Progress Notes (Signed)
        Date: 11/15/2021  Patient name: [place] record number: [id number]  Date of birth: 1931-11-29    Patients CT reviewed and zero pathology in the lungs or abdomen.  I really do NOT think she needs much more anti-bacterial antibiotics because I cannot ID a clear bacterial infection here  I will leave unasyn for now but would likely dc in the am  I suspect this may have all been COVID rebound.   Alcide Evener 11/15/2021, 5:26 PM

## 2021-11-16 DIAGNOSIS — R8271 Bacteriuria: Secondary | ICD-10-CM | POA: Diagnosis not present

## 2021-11-16 DIAGNOSIS — U071 COVID-19: Secondary | ICD-10-CM | POA: Diagnosis not present

## 2021-11-16 DIAGNOSIS — G9341 Metabolic encephalopathy: Secondary | ICD-10-CM | POA: Diagnosis not present

## 2021-11-16 LAB — BASIC METABOLIC PANEL
Anion gap: 11 (ref 5–15)
BUN: 23 mg/dL (ref 8–23)
CO2: 25 mmol/L (ref 22–32)
Calcium: 8.7 mg/dL — ABNORMAL LOW (ref 8.9–10.3)
Chloride: 103 mmol/L (ref 98–111)
Creatinine, Ser: 0.76 mg/dL (ref 0.44–1.00)
GFR, Estimated: >60 mL/min (ref >60)
Glucose, Bld: 212 mg/dL — ABNORMAL HIGH (ref 70–99)
Potassium: 3.8 mmol/L (ref 3.5–5.1)
Sodium: 139 mmol/L (ref 135–145)

## 2021-11-16 LAB — PROCALCITONIN: Procalcitonin: <0.1 ng/mL

## 2021-11-16 LAB — CBC
HCT: 34.8 % — ABNORMAL LOW (ref 36.0–46.0)
Hemoglobin: 11.6 g/dL — ABNORMAL LOW (ref 12.0–15.0)
MCH: 32.8 pg (ref 26.0–34.0)
MCHC: 33.3 g/dL (ref 30.0–36.0)
MCV: 98.3 fL (ref 80.0–100.0)
Platelets: 221 10*3/uL (ref 150–400)
RBC: 3.54 MIL/uL — ABNORMAL LOW (ref 3.87–5.11)
RDW: 12.1 % (ref 11.5–15.5)
WBC: 15.7 10*3/uL — ABNORMAL HIGH (ref 4.0–10.5)
nRBC: 0 % (ref 0.0–0.2)

## 2021-11-16 LAB — STREP PNEUMONIAE URINARY ANTIGEN: Strep Pneumo Urinary Antigen: NEGATIVE

## 2021-11-16 LAB — C-REACTIVE PROTEIN: CRP: 9.2 mg/dL — ABNORMAL HIGH (ref <1.0)

## 2021-11-16 LAB — MAGNESIUM: Magnesium: 2.3 mg/dL (ref 1.7–2.4)

## 2021-11-16 NOTE — Progress Notes (Signed)
[facility]  Date of Admission:  11/10/2021      ASSESSMENT: 88 yo female admitted with flu like sx/sepsis in setting ongoing covid infection and dirty ua  She had significantly improved with covid supportive care Fever is ongoing in setting of several day abx for presumed uti  She has no uti sx  I suspect this is all covid  I would stop abx to avoid side effect  E faecalis likely assymptomatic bacteriuria Initial bcx CoNS contaminant  PLAN: Stop abx Continued covid care as per primary team Id will sign off Discuss with primary team  I spent more than 35 minute reviewing data/chart, and coordinating care and >50% direct face to face time providing counseling/discussing diagnostics/treatment plan with patient   Principal Problem:   Acute metabolic encephalopathy Active Problems:   PAF (paroxysmal atrial fibrillation) (HCC)   History of stroke   Essential hypertension   Hyperlipidemia LDL goal <70   Chronic UTI   Obstructive sleep apnea   COVID-19 virus infection   Abnormal LFTs   Generalized weakness   Fever   Hypokalemia   FUO (fever of unknown origin)   Allergies  Allergen Reactions   Bee Venom Anaphylaxis   Mixed Vespid Venom Anaphylaxis   Codeine Other (See Comments)    Exact allergic reaction not known   Sulfa Antibiotics Itching and Nausea Only   Erythromycin Nausea And Vomiting    Scheduled Meds:  amLODipine  2.5 mg Oral Daily   apixaban  5 mg Oral BID   ascorbic acid  500 mg Oral Daily   feeding supplement  237 mL Oral BID BM   methylPREDNISolone (SOLU-MEDROL) injection  40 mg Intravenous BID   polycarbophil  625 mg Oral Daily   saccharomyces boulardii  250 mg Oral Daily   zinc sulfate  220 mg Oral Daily   Continuous Infusions: PRN Meds:.acetaminophen **OR** acetaminophen, guaiFENesin, Ipratropium-Albuterol, ondansetron **OR** ondansetron (ZOFRAN) IV, oxyCODONE   SUBJECTIVE: Patient felt well  almost baseline Some cough No o2 requirement No uti sx No flank pain  Started on methylpred 10/21 for ?covid (she only briefly needed o2)   Review of Systems: ROS All other ROS was negative, except mentioned above     OBJECTIVE: Vitals:   11/15/21 2121 11/15/21 2247 11/16/21 0557 11/16/21 1525  BP: 137/70  (!) 143/70 135/83  Pulse: 65 65 67 73  Resp: '20 20 20   '$ Temp: 98 F (36.7 C)  97.6 F (36.4 C) (!) 97.5 F (36.4 C)  TempSrc: Oral  Oral Oral  SpO2: 97% 97% 96% 97%  Weight:      Height:       Body mass index is 29.29 kg/m.  Physical Exam General/constitutional: no distress, pleasant HEENT: Normocephalic, PER, Conj Clear, EOMI, Oropharynx clear Neck supple CV: rrr no mrg Lungs: clear to auscultation, normal respiratory effort Abd: Soft, Nontender Ext: no edema Skin: No Rash Neuro: nonfocal MSK: no peripheral joint swelling/tenderness/warmth; back spines nontender    Lab Results Lab Results  Component Value Date   WBC 15.7 (H) 11/16/2021   HGB 11.6 (L) 11/16/2021   HCT 34.8 (L) 11/16/2021   MCV 98.3 11/16/2021   PLT 221 11/16/2021    Lab Results  Component Value Date   CREATININE 0.76 11/16/2021   BUN 23 11/16/2021   NA 139 11/16/2021   K 3.8 11/16/2021   CL 103 11/16/2021   CO2 25 11/16/2021  Lab Results  Component Value Date   ALT 72 (H) 11/11/2021   AST 29 11/11/2021   ALKPHOS 41 11/11/2021   BILITOT 0.9 11/11/2021      Microbiology: Recent Results (from the past 240 hour(s))  Resp Panel by RT-PCR (Flu A&B, Covid) Anterior Nasal Swab     Status: Abnormal   Collection Time: 11/10/21  8:43 AM   Specimen: Anterior Nasal Swab  Result Value Ref Range Status   SARS Coronavirus 2 by RT PCR POSITIVE (A) NEGATIVE Final    Comment: (NOTE) SARS-CoV-2 target nucleic acids are DETECTED.  The SARS-CoV-2 RNA is generally detectable in upper respiratory specimens during the acute phase of infection. Positive results are indicative of the  presence of the identified virus, but do not rule out bacterial infection or co-infection with other pathogens not detected by the test. Clinical correlation with patient history and other diagnostic information is necessary to determine patient infection status. The expected result is Negative.  Fact Sheet for Patients: EntrepreneurPulse.com.au  Fact Sheet for Healthcare Providers: IncredibleEmployment.be  This test is not yet approved or cleared by the [place] FDA and  has been authorized for detection and/or diagnosis of SARS-CoV-2 by FDA under an Emergency Use Authorization (EUA).  This EUA will remain in effect (meaning this test can be used) for the duration of  the COVID-19 declaration under Section 564(b)(1) of the A ct, 21 U.S.C. section 360bbb-3(b)(1), unless the authorization is terminated or revoked sooner.     Influenza A by PCR NEGATIVE NEGATIVE Final   Influenza B by PCR NEGATIVE NEGATIVE Final    Comment: (NOTE) The Xpert Xpress SARS-CoV-2/FLU/RSV plus assay is intended as an aid in the diagnosis of influenza from Nasopharyngeal swab specimens and should not be used as a sole basis for treatment. Nasal washings and aspirates are unacceptable for Xpert Xpress SARS-CoV-2/FLU/RSV testing.  Fact Sheet for Patients: EntrepreneurPulse.com.au  Fact Sheet for Healthcare Providers: IncredibleEmployment.be  This test is not yet approved or cleared by the [place] FDA and has been authorized for detection and/or diagnosis of SARS-CoV-2 by FDA under an Emergency Use Authorization (EUA). This EUA will remain in effect (meaning this test can be used) for the duration of the COVID-19 declaration under Section 564(b)(1) of the Act, 21 U.S.C. section 360bbb-3(b)(1), unless the authorization is terminated or revoked.  Performed at [facility], [place] [address] [place], [place] [address]   Culture, blood (routine x 2)     Status: None   Collection Time: 11/10/21  9:04 AM   Specimen: BLOOD  Result Value Ref Range Status   Specimen Description   Final    BLOOD LEFT ANTECUBITAL Performed at [facility] [address] [place], [place] [address]    Special Requests   Final    BOTTLES DRAWN AEROBIC AND ANAEROBIC Blood Culture adequate volume Performed at [facility] [address] [place], [place] [address]    Culture   Final    NO GROWTH 5 DAYS Performed at [facility], [place] [address] [place], [place] [address]    Report Status 11/15/2021 FINAL  Final  Culture, blood (routine x 2)     Status: Abnormal   Collection Time: 11/10/21  9:05 AM   Specimen: BLOOD  Result Value Ref Range Status   Specimen Description   Final    BLOOD LEFT ANTECUBITAL Performed at [facility] [address] [place], [place] [address]  Special Requests   Final    BOTTLES DRAWN AEROBIC ONLY Blood Culture results may not be optimal due to an inadequate volume of blood received in culture bottles Performed at [facility] [address], [place], [place] [address]    Culture  Setup Time   Final    GRAM POSITIVE COCCI IN CLUSTERS AEROBIC BOTTLE ONLY CRITICAL RESULT CALLED TO, READ BACK BY AND VERIFIED WITH: PHARMD E JACKSON 11/12/21 '@107'$  BY AB    Culture (A)  Final    STAPHYLOCOCCUS EPIDERMIDIS THE SIGNIFICANCE OF ISOLATING THIS ORGANISM FROM A SINGLE SET OF BLOOD CULTURES WHEN MULTIPLE SETS ARE DRAWN IS UNCERTAIN. PLEASE NOTIFY THE MICROBIOLOGY DEPARTMENT WITHIN ONE WEEK IF SPECIATION AND SENSITIVITIES ARE REQUIRED. Performed at [facility], [place] [address], [place], [place] [address]    Report Status 11/13/2021 FINAL  Final  Blood Culture ID Panel (Reflexed)     Status: Abnormal   Collection Time: 11/10/21  9:05 AM  Result Value Ref Range Status   Enterococcus  faecalis NOT DETECTED NOT DETECTED Final   Enterococcus Faecium NOT DETECTED NOT DETECTED Final   Listeria monocytogenes NOT DETECTED NOT DETECTED Final   Staphylococcus species DETECTED (A) NOT DETECTED Final    Comment: CRITICAL RESULT CALLED TO, READ BACK BY AND VERIFIED WITH: PHARMD E JACKSON 11/12/21 '@107'$  BY AB    Staphylococcus aureus (BCID) NOT DETECTED NOT DETECTED Final   Staphylococcus epidermidis DETECTED (A) NOT DETECTED Final    Comment: Methicillin (oxacillin) resistant coagulase negative staphylococcus. Possible blood culture contaminant (unless isolated from more than one blood culture draw or clinical case suggests pathogenicity). No antibiotic treatment is indicated for blood  culture contaminants. CRITICAL RESULT CALLED TO, READ BACK BY AND VERIFIED WITH: PHARMD E JACKSON 11/12/21 '@107'$  BY AB    Staphylococcus lugdunensis NOT DETECTED NOT DETECTED Final   Streptococcus species NOT DETECTED NOT DETECTED Final   Streptococcus agalactiae NOT DETECTED NOT DETECTED Final   Streptococcus pneumoniae NOT DETECTED NOT DETECTED Final   Streptococcus pyogenes NOT DETECTED NOT DETECTED Final   A.calcoaceticus-baumannii NOT DETECTED NOT DETECTED Final   Bacteroides fragilis NOT DETECTED NOT DETECTED Final   Enterobacterales NOT DETECTED NOT DETECTED Final   Enterobacter cloacae complex NOT DETECTED NOT DETECTED Final   Escherichia coli NOT DETECTED NOT DETECTED Final   Klebsiella aerogenes NOT DETECTED NOT DETECTED Final   Klebsiella oxytoca NOT DETECTED NOT DETECTED Final   Klebsiella pneumoniae NOT DETECTED NOT DETECTED Final   Proteus species NOT DETECTED NOT DETECTED Final   Salmonella species NOT DETECTED NOT DETECTED Final   Serratia marcescens NOT DETECTED NOT DETECTED Final   Haemophilus influenzae NOT DETECTED NOT DETECTED Final   Neisseria meningitidis NOT DETECTED NOT DETECTED Final   Pseudomonas aeruginosa NOT DETECTED NOT DETECTED Final   Stenotrophomonas  maltophilia NOT DETECTED NOT DETECTED Final   Candida albicans NOT DETECTED NOT DETECTED Final   Candida auris NOT DETECTED NOT DETECTED Final   Candida glabrata NOT DETECTED NOT DETECTED Final   Candida krusei NOT DETECTED NOT DETECTED Final   Candida parapsilosis NOT DETECTED NOT DETECTED Final   Candida tropicalis NOT DETECTED NOT DETECTED Final   Cryptococcus neoformans/gattii NOT DETECTED NOT DETECTED Final   Methicillin resistance mecA/C DETECTED (A) NOT DETECTED Final    Comment: CRITICAL RESULT CALLED TO, READ BACK BY AND VERIFIED WITH: PHARMD E JACKSON 11/12/21 '@107'$  BY AB Performed at [facility], [address], [place], [place] [address]   Urine Culture  Status: Abnormal   Collection Time: 11/10/21 12:17 PM   Specimen: Urine, Clean Catch  Result Value Ref Range Status   Specimen Description   Final    URINE, CLEAN CATCH Performed at [facility], [place] [address], [place], [place] [address]    Special Requests   Final    NONE Performed at [facility], [place] [address] [place], [place] [address]    Culture 30,000 COLONIES/mL ENTEROCOCCUS FAECALIS (A)  Final   Report Status 11/12/2021 FINAL  Final   Organism ID, Bacteria ENTEROCOCCUS FAECALIS (A)  Final      Susceptibility   Enterococcus faecalis - MIC*    AMPICILLIN <=2 SENSITIVE Sensitive     NITROFURANTOIN <=16 SENSITIVE Sensitive     VANCOMYCIN 1 SENSITIVE Sensitive     * 30,000 COLONIES/mL ENTEROCOCCUS FAECALIS  Respiratory (~20 pathogens) panel by PCR     Status: None   Collection Time: 11/14/21 10:21 AM   Specimen: Nasopharyngeal Swab; Respiratory  Result Value Ref Range Status   Adenovirus NOT DETECTED NOT DETECTED Final   Coronavirus 229E NOT DETECTED NOT DETECTED Final    Comment: (NOTE) The Coronavirus on the Respiratory Panel, DOES NOT test for the novel  Coronavirus (2019 nCoV)    Coronavirus HKU1 NOT DETECTED NOT DETECTED Final   Coronavirus NL63 NOT  DETECTED NOT DETECTED Final   Coronavirus OC43 NOT DETECTED NOT DETECTED Final   Metapneumovirus NOT DETECTED NOT DETECTED Final   Rhinovirus / Enterovirus NOT DETECTED NOT DETECTED Final   Influenza A NOT DETECTED NOT DETECTED Final   Influenza B NOT DETECTED NOT DETECTED Final   Parainfluenza Virus 1 NOT DETECTED NOT DETECTED Final   Parainfluenza Virus 2 NOT DETECTED NOT DETECTED Final   Parainfluenza Virus 3 NOT DETECTED NOT DETECTED Final   Parainfluenza Virus 4 NOT DETECTED NOT DETECTED Final   Respiratory Syncytial Virus NOT DETECTED NOT DETECTED Final   Bordetella pertussis NOT DETECTED NOT DETECTED Final   Bordetella Parapertussis NOT DETECTED NOT DETECTED Final   Chlamydophila pneumoniae NOT DETECTED NOT DETECTED Final   Mycoplasma pneumoniae NOT DETECTED NOT DETECTED Final    Comment: Performed at [facility], [place] [address] [place], [place] [address]  Culture, blood (Routine X 2) w Reflex to ID Panel     Status: None (Preliminary result)   Collection Time: 11/14/21 11:19 AM   Specimen: BLOOD  Result Value Ref Range Status   Specimen Description   Final    BLOOD RIGHT ANTECUBITAL Performed at [facility] [address], [place], [place] [address]    Special Requests   Final    BOTTLES DRAWN AEROBIC ONLY Blood Culture adequate volume Performed at [facility] [address], [place], [place] [address]    Culture   Final    NO GROWTH 2 DAYS Performed at [facility] [address], [place], [place] [address]    Report Status PENDING  Incomplete  Culture, blood (Routine X 2) w Reflex to ID Panel     Status: None (Preliminary result)   Collection Time: 11/14/21 11:19 AM   Specimen: BLOOD  Result Value Ref Range Status   Specimen Description   Final    BLOOD BLOOD RIGHT HAND Performed at [facility] [address], [place], [place] [address]    Special Requests   Final    BOTTLES DRAWN AEROBIC  ONLY Blood Culture adequate volume Performed at [facility], [place] [address], [place], [place] [address]    Culture   Final    NO GROWTH 2 DAYS Performed at [facility] [address], [place], [place] [address]    Report Status PENDING  Incomplete     Serology:   Imaging: If present, new imagings (plain films, ct scans, and mri) have been personally visualized and interpreted; radiology reports have been reviewed. Decision making incorporated into the Impression / Recommendations.   Jabier Mutton, [facility] [phone] pager    11/16/2021, 5:17 PM

## 2021-11-16 NOTE — Progress Notes (Signed)
PROGRESS NOTE    [name]  BMW:[id number] DOB: 10/10/1931 DOA: 11/10/2021 PCP: Crist Infante, MD    Brief Narrative:  Patient with history of paroxysmal atrial fibrillation and a flutter, hypertension, obstructive sleep apnea, hyperlipidemia, stroke in the community on suppressive antibiotic therapy and recently admitted with COVID-19 infection be discharged home on prednisone taper presented back to the emergency room with fatigue and weakness, poor oral intake and low-grade temperature at home.  In the emergency room she had temperature 1.5.  Repeat COVID testing was positive.  Urine cultures and blood cultures were sent and patient was admitted for treatment of infection.  She was noted to have Enterococcus in urine, treated with Rocephin and then subsequently with Augmentin.  Patient continued to have low-grade fever and ID was consulted.   Assessment & Plan:   Recurrent fever and persistent COVID-19 infection: Blood cultures negative Cultures with Enterococcus, already received adequate antibiotics. Repeat cultures 10/21 negative to date. Clinically improving.  CT scan chest abdomen pelvis without any evidence of infection.  Procalcitonin normal. Monitor without antibiotics today.  ID is following. Gram-positive bacteremia with Staph epidermidis is likely contaminant.  Acute metabolic encephalopathy: Improved.  Electrolytes: Improved.  Paroxysmal A-fib: Sinus rhythm.  Rate controlled.  On Eliquis.  Essential hypertension.  Stable on amlodipine.  Obstructive sleep apnea on CPAP.   DVT prophylaxis:  apixaban (ELIQUIS) tablet 5 mg   Code Status: Full code Family Communication: Daughter at the bedside Disposition Plan: Status is: Inpatient Remains inpatient appropriate because: Persistent fever, IV antibiotics and monitoring     Consultants:  Infectious disease  Procedures:  None  Antimicrobials:  Unasyn, stopped today   Subjective: Patient was seen and  examined.  No overnight events.  Denies any cough wheezing or fever overnight.  Breathing has much improved.  Trying to get around in the room and walk around.  Daughter at the bedside.  Objective: Vitals:   11/15/21 1646 11/15/21 2121 11/15/21 2247 11/16/21 0557  BP: 127/67 137/70  (!) 143/70  Pulse: 73 65 65 67  Resp: '18 20 20 20  '$ Temp: 98.3 F (36.8 C) 98 F (36.7 C)  97.6 F (36.4 C)  TempSrc: Oral Oral  Oral  SpO2: 93% 97% 97% 96%  Weight:      Height:        Intake/Output Summary (Last 24 hours) at 11/16/2021 1346 Last data filed at 11/15/2021 1600 Gross per 24 hour  Intake --  Output 500 ml  Net -500 ml   Filed Weights   11/10/21 0923  Weight: 68 kg    Examination:  General exam: Appears calm and comfortable, not in any distress. Respiratory system: Clear to auscultation. Respiratory effort normal.  No added sounds. Cardiovascular system: S1 & S2 heard, RRR.  Gastrointestinal system: Abdomen is nondistended, soft and nontender. No organomegaly or masses felt. Normal bowel sounds heard. Central nervous system: Alert and oriented. No focal neurological deficits. Extremities: Symmetric 5 x 5 power. Skin: No rashes, lesions or ulcers Psychiatry: Judgement and insight appear normal. Mood & affect appropriate.     Data Reviewed: I have personally reviewed following labs and imaging studies  CBC: Recent Labs  Lab 11/10/21 0844 11/11/21 0543 11/12/21 0512 11/13/21 0531 11/15/21 0542 11/16/21 0453  WBC 11.2* 8.6 7.2 7.4 5.5 15.7*  NEUTROABS 8.9* 6.3  --   --   --   --   HGB 15.9* 13.9 12.3 13.0 12.5 11.6*  HCT 48.0* 43.2 37.3 40.5 37.6 34.8*  MCV  98.8 102.1* 100.0 103.1* 98.9 98.3  PLT 133* 118* 106* 119* 161 419   Basic Metabolic Panel: Recent Labs  Lab 11/11/21 0543 11/12/21 0512 11/13/21 0531 11/15/21 0542 11/16/21 0453  NA 139 137 137 139 139  K 3.8 3.2* 4.5 3.7 3.8  CL 111 108 108 104 103  CO2 '24 22 25 28 25  '$ GLUCOSE 130* 137* 104* 193* 212*   BUN '16 13 11 22 23  '$ CREATININE 0.87 0.61 0.76 0.80 0.76  CALCIUM 7.7* 7.5* 8.0* 8.6* 8.7*  MG 2.2 2.0  --  2.5* 2.3   GFR: Estimated Creatinine Clearance: 41.8 mL/min (by C-G formula based on SCr of 0.76 mg/dL). Liver Function Tests: Recent Labs  Lab 11/10/21 0844 11/11/21 0543  AST 31 29  ALT 92* 72*  ALKPHOS 48 41  BILITOT 1.7* 0.9  PROT 6.1* 5.4*  ALBUMIN 3.2* 2.7*   No results for input(s): "LIPASE", "AMYLASE" in the last 168 hours. No results for input(s): "AMMONIA" in the last 168 hours. Coagulation Profile: No results for input(s): "INR", "PROTIME" in the last 168 hours. Cardiac Enzymes: No results for input(s): "CKTOTAL", "CKMB", "CKMBINDEX", "TROPONINI" in the last 168 hours. BNP (last 3 results) No results for input(s): "PROBNP" in the last 8760 hours. HbA1C: No results for input(s): "HGBA1C" in the last 72 hours. CBG: No results for input(s): "GLUCAP" in the last 168 hours. Lipid Profile: No results for input(s): "CHOL", "HDL", "LDLCALC", "TRIG", "CHOLHDL", "LDLDIRECT" in the last 72 hours. Thyroid Function Tests: No results for input(s): "TSH", "T4TOTAL", "FREET4", "T3FREE", "THYROIDAB" in the last 72 hours. Anemia Panel: No results for input(s): "VITAMINB12", "FOLATE", "FERRITIN", "TIBC", "IRON", "RETICCTPCT" in the last 72 hours. Sepsis Labs: Recent Labs  Lab 11/16/21 0453  PROCALCITON <0.10    Recent Results (from the past 240 hour(s))  Resp Panel by RT-PCR (Flu A&B, Covid) Anterior Nasal Swab     Status: Abnormal   Collection Time: 11/10/21  8:43 AM   Specimen: Anterior Nasal Swab  Result Value Ref Range Status   SARS Coronavirus 2 by RT PCR POSITIVE (A) NEGATIVE Final    Comment: (NOTE) SARS-CoV-2 target nucleic acids are DETECTED.  The SARS-CoV-2 RNA is generally detectable in upper respiratory specimens during the acute phase of infection. Positive results are indicative of the presence of the identified virus, but do not rule out bacterial  infection or co-infection with other pathogens not detected by the test. Clinical correlation with patient history and other diagnostic information is necessary to determine patient infection status. The expected result is Negative.  Fact Sheet for Patients: EntrepreneurPulse.com.au  Fact Sheet for Healthcare Providers: IncredibleEmployment.be  This test is not yet approved or cleared by the [place] FDA and  has been authorized for detection and/or diagnosis of SARS-CoV-2 by FDA under an Emergency Use Authorization (EUA).  This EUA will remain in effect (meaning this test can be used) for the duration of  the COVID-19 declaration under Section 564(b)(1) of the A ct, 21 U.S.C. section 360bbb-3(b)(1), unless the authorization is terminated or revoked sooner.     Influenza A by PCR NEGATIVE NEGATIVE Final   Influenza B by PCR NEGATIVE NEGATIVE Final    Comment: (NOTE) The Xpert Xpress SARS-CoV-2/FLU/RSV plus assay is intended as an aid in the diagnosis of influenza from Nasopharyngeal swab specimens and should not be used as a sole basis for treatment. Nasal washings and aspirates are unacceptable for Xpert Xpress SARS-CoV-2/FLU/RSV testing.  Fact Sheet for Patients: EntrepreneurPulse.com.au  Fact Sheet for  Healthcare Providers: IncredibleEmployment.be  This test is not yet approved or cleared by the [place] and has been authorized for detection and/or diagnosis of SARS-CoV-2 by FDA under an Emergency Use Authorization (EUA). This EUA will remain in effect (meaning this test can be used) for the duration of the COVID-19 declaration under Section 564(b)(1) of the Act, 21 U.S.C. section 360bbb-3(b)(1), unless the authorization is terminated or revoked.  Performed at [facility], [place] [address] [place], [place] [address]   Culture, blood (routine x 2)     Status: None    Collection Time: 11/10/21  9:04 AM   Specimen: BLOOD  Result Value Ref Range Status   Specimen Description   Final    BLOOD LEFT ANTECUBITAL Performed at [facility] [address], [place], [place] [address]    Special Requests   Final    BOTTLES DRAWN AEROBIC AND ANAEROBIC Blood Culture adequate volume Performed at [facility] [address], [place], [place] [address]    Culture   Final    NO GROWTH 5 DAYS Performed at [facility], [place] [address] [place], [place] [address]    Report Status 11/15/2021 FINAL  Final  Culture, blood (routine x 2)     Status: Abnormal   Collection Time: 11/10/21  9:05 AM   Specimen: BLOOD  Result Value Ref Range Status   Specimen Description   Final    BLOOD LEFT ANTECUBITAL Performed at [facility] [address] [place], [place] [address]    Special Requests   Final    BOTTLES DRAWN AEROBIC ONLY Blood Culture results may not be optimal due to an inadequate volume of blood received in culture bottles Performed at [facility] [address], [place], [place] [address]    Culture  Setup Time   Final    GRAM POSITIVE COCCI IN CLUSTERS AEROBIC BOTTLE ONLY CRITICAL RESULT CALLED TO, READ BACK BY AND VERIFIED WITH: PHARMD E JACKSON 11/12/21 '@107'$  BY AB    Culture (A)  Final    STAPHYLOCOCCUS EPIDERMIDIS THE SIGNIFICANCE OF ISOLATING THIS ORGANISM FROM A SINGLE SET OF BLOOD CULTURES WHEN MULTIPLE SETS ARE DRAWN IS UNCERTAIN. PLEASE NOTIFY THE MICROBIOLOGY DEPARTMENT WITHIN ONE WEEK IF SPECIATION AND SENSITIVITIES ARE REQUIRED. Performed at [facility], [place] [address] [place], [place] [address]    Report Status 11/13/2021 FINAL  Final  Blood Culture ID Panel (Reflexed)     Status: Abnormal   Collection Time: 11/10/21  9:05 AM  Result Value Ref Range Status   Enterococcus faecalis NOT DETECTED NOT DETECTED Final   Enterococcus Faecium NOT DETECTED NOT  DETECTED Final   Listeria monocytogenes NOT DETECTED NOT DETECTED Final   Staphylococcus species DETECTED (A) NOT DETECTED Final    Comment: CRITICAL RESULT CALLED TO, READ BACK BY AND VERIFIED WITH: PHARMD E JACKSON 11/12/21 '@107'$  BY AB    Staphylococcus aureus (BCID) NOT DETECTED NOT DETECTED Final   Staphylococcus epidermidis DETECTED (A) NOT DETECTED Final    Comment: Methicillin (oxacillin) resistant coagulase negative staphylococcus. Possible blood culture contaminant (unless isolated from more than one blood culture draw or clinical case suggests pathogenicity). No antibiotic treatment is indicated for blood  culture contaminants. CRITICAL RESULT CALLED TO, READ BACK BY AND VERIFIED WITH: PHARMD E JACKSON 11/12/21 '@107'$  BY AB    Staphylococcus lugdunensis NOT DETECTED NOT DETECTED Final   Streptococcus species NOT DETECTED NOT DETECTED Final  Streptococcus agalactiae NOT DETECTED NOT DETECTED Final   Streptococcus pneumoniae NOT DETECTED NOT DETECTED Final   Streptococcus pyogenes NOT DETECTED NOT DETECTED Final   A.calcoaceticus-baumannii NOT DETECTED NOT DETECTED Final   Bacteroides fragilis NOT DETECTED NOT DETECTED Final   Enterobacterales NOT DETECTED NOT DETECTED Final   Enterobacter cloacae complex NOT DETECTED NOT DETECTED Final   Escherichia coli NOT DETECTED NOT DETECTED Final   Klebsiella aerogenes NOT DETECTED NOT DETECTED Final   Klebsiella oxytoca NOT DETECTED NOT DETECTED Final   Klebsiella pneumoniae NOT DETECTED NOT DETECTED Final   Proteus species NOT DETECTED NOT DETECTED Final   Salmonella species NOT DETECTED NOT DETECTED Final   Serratia marcescens NOT DETECTED NOT DETECTED Final   Haemophilus influenzae NOT DETECTED NOT DETECTED Final   Neisseria meningitidis NOT DETECTED NOT DETECTED Final   Pseudomonas aeruginosa NOT DETECTED NOT DETECTED Final   Stenotrophomonas maltophilia NOT DETECTED NOT DETECTED Final   Candida albicans NOT DETECTED NOT DETECTED  Final   Candida auris NOT DETECTED NOT DETECTED Final   Candida glabrata NOT DETECTED NOT DETECTED Final   Candida krusei NOT DETECTED NOT DETECTED Final   Candida parapsilosis NOT DETECTED NOT DETECTED Final   Candida tropicalis NOT DETECTED NOT DETECTED Final   Cryptococcus neoformans/gattii NOT DETECTED NOT DETECTED Final   Methicillin resistance mecA/C DETECTED (A) NOT DETECTED Final    Comment: CRITICAL RESULT CALLED TO, READ BACK BY AND VERIFIED WITH: PHARMD E JACKSON 11/12/21 '@107'$  BY AB Performed at [facility], [address], [place], [place] [address]   Urine Culture     Status: Abnormal   Collection Time: 11/10/21 12:17 PM   Specimen: Urine, Clean Catch  Result Value Ref Range Status   Specimen Description   Final    URINE, CLEAN CATCH Performed at [facility], [place] [address], [place], [place] [address]    Special Requests   Final    NONE Performed at [facility], [place] [address], [place], [place] [address]    Culture 30,000 COLONIES/mL ENTEROCOCCUS FAECALIS (A)  Final   Report Status 11/12/2021 FINAL  Final   Organism ID, Bacteria ENTEROCOCCUS FAECALIS (A)  Final      Susceptibility   Enterococcus faecalis - MIC*    AMPICILLIN <=2 SENSITIVE Sensitive     NITROFURANTOIN <=16 SENSITIVE Sensitive     VANCOMYCIN 1 SENSITIVE Sensitive     * 30,000 COLONIES/mL ENTEROCOCCUS FAECALIS  Respiratory (~20 pathogens) panel by PCR     Status: None   Collection Time: 11/14/21 10:21 AM   Specimen: Nasopharyngeal Swab; Respiratory  Result Value Ref Range Status   Adenovirus NOT DETECTED NOT DETECTED Final   Coronavirus 229E NOT DETECTED NOT DETECTED Final    Comment: (NOTE) The Coronavirus on the Respiratory Panel, DOES NOT test for the novel  Coronavirus (2019 nCoV)    Coronavirus HKU1 NOT DETECTED NOT DETECTED Final   Coronavirus NL63 NOT DETECTED NOT DETECTED Final   Coronavirus OC43 NOT DETECTED NOT DETECTED Final    Metapneumovirus NOT DETECTED NOT DETECTED Final   Rhinovirus / Enterovirus NOT DETECTED NOT DETECTED Final   Influenza A NOT DETECTED NOT DETECTED Final   Influenza B NOT DETECTED NOT DETECTED Final   Parainfluenza Virus 1 NOT DETECTED NOT DETECTED Final   Parainfluenza Virus 2 NOT DETECTED NOT DETECTED Final   Parainfluenza Virus 3 NOT DETECTED NOT DETECTED Final   Parainfluenza Virus 4 NOT DETECTED NOT DETECTED Final   Respiratory Syncytial Virus  NOT DETECTED NOT DETECTED Final   Bordetella pertussis NOT DETECTED NOT DETECTED Final   Bordetella Parapertussis NOT DETECTED NOT DETECTED Final   Chlamydophila pneumoniae NOT DETECTED NOT DETECTED Final   Mycoplasma pneumoniae NOT DETECTED NOT DETECTED Final    Comment: Performed at [facility], [place] [address], [place], [place] [address]  Culture, blood (Routine X 2) w Reflex to ID Panel     Status: None (Preliminary result)   Collection Time: 11/14/21 11:19 AM   Specimen: BLOOD  Result Value Ref Range Status   Specimen Description   Final    BLOOD RIGHT ANTECUBITAL Performed at [facility] [address] [place], [place] [address]    Special Requests   Final    BOTTLES DRAWN AEROBIC ONLY Blood Culture adequate volume Performed at [facility] [address] [place], [place] [address]    Culture   Final    NO GROWTH 2 DAYS Performed at [facility] [address] [place], [place] [address]    Report Status PENDING  Incomplete  Culture, blood (Routine X 2) w Reflex to ID Panel     Status: None (Preliminary result)   Collection Time: 11/14/21 11:19 AM   Specimen: BLOOD  Result Value Ref Range Status   Specimen Description   Final    BLOOD BLOOD RIGHT HAND Performed at [facility] [address], [place], [place] [address]    Special Requests   Final    BOTTLES DRAWN AEROBIC ONLY Blood Culture adequate volume Performed at [facility] [address] [place], [place] [address]    Culture   Final    NO GROWTH 2 DAYS Performed at [facility] [address] [place], [place] [address]    Report Status PENDING  Incomplete         Radiology Studies: CT CHEST ABDOMEN PELVIS W CONTRAST  Result Date: 11/15/2021 CLINICAL DATA:  Sepsis, fever EXAM: CT CHEST, ABDOMEN, AND PELVIS WITH CONTRAST TECHNIQUE: Multidetector CT imaging of the chest, abdomen and pelvis was performed following the standard protocol during bolus administration of intravenous contrast. RADIATION DOSE REDUCTION: This exam was performed according to the departmental dose-optimization program which includes automated exposure control, adjustment of the mA and/or kV according to patient size and/or use of iterative reconstruction technique. CONTRAST:  164m OMNIPAQUE IOHEXOL 300 MG/ML  SOLN COMPARISON:  Previous studies including CT abdomen and pelvis done on 10/29/2021, CT chest done on 01/21/2020 FINDINGS: CT CHEST FINDINGS Cardiovascular: Major vascular structures are unremarkable. Heart is enlarged in size. Calcifications are noted in mitral annulus. Mediastinum/Nodes: There are slightly enlarged lymph nodes in mediastinum with no significant interval change. Lungs/Pleura: Centrilobular and panlobular emphysema is seen. Increased interstitial markings are seen in the periphery of both lungs, more so in left upper lobe. Findings suggest chronic interstitial lung disease with scarring. There is no focal pulmonary consolidation. There is no pleural effusion or pneumothorax. There is small plaque-like pleural density in the posterolateral aspect of right lower lung field which has not changed, possibly scarring. Musculoskeletal: No acute findings are seen in bony structures in the thorax. CT ABDOMEN PELVIS FINDINGS Hepatobiliary: There is fatty infiltration in liver. There is 9 mm low-density in the right lobe of liver with no change, possibly a cyst. There is no  dilation of bile ducts. Gallbladder is not distended. There is no fluid around the gallbladder. Pancreas: No focal abnormalities are seen. Spleen:  Unremarkable. Adrenals/Urinary Tract: Adrenals are unremarkable. There is no hydronephrosis. There are no renal or ureteral stones. Urinary bladder is unremarkable. Stomach/Bowel: Stomach is distended with fluid in the lumen. There is no focal wall thickening in stomach. There is no significant small bowel dilation. Appendix is not dilated. There is no significant wall thickening in colon. Scattered diverticula seen in colon without signs of focal diverticulitis. Vascular/Lymphatic: Atherosclerotic plaques and calcifications are seen in aorta and its major branches. Reproductive: Uterus is not seen. Other: There is no ascites or pneumoperitoneum. Musculoskeletal: Slight decrease in height of body of L2 vertebra appears stable. There is fusion of bodies L3 and L4 vertebrae. Degenerative changes are noted with disc space narrowing, bony spurs and facet hypertrophy in the lumbar spine and lower thoracic spine. IMPRESSION: No acute findings are seen in CT scan of chest, abdomen and pelvis. There is no focal pulmonary consolidation. There is no pleural effusion. Chronic interstitial lung disease with scarring. There is no evidence of intestinal obstruction or pneumoperitoneum. There is no hydronephrosis. Appendix is not dilated. Fatty liver. Scattered diverticula are seen in colon without signs of focal diverticulitis. Arteriosclerosis. Lumbar spondylosis. Other findings as described in the body of the report. Electronically Signed   By: Elmer Picker M.D.   On: 11/15/2021 14:56        Scheduled Meds:  amLODipine  2.5 mg Oral Daily   apixaban  5 mg Oral BID   ascorbic acid  500 mg Oral Daily   feeding supplement  237 mL Oral BID BM   methylPREDNISolone (SOLU-MEDROL) injection  40 mg Intravenous BID   polycarbophil  625 mg Oral Daily   saccharomyces boulardii   250 mg Oral Daily   zinc sulfate  220 mg Oral Daily   Continuous Infusions:   LOS: 5 days    Time spent: 35 minutes    Barb Merino, MD [facility] Pager [phone]

## 2021-11-16 NOTE — Progress Notes (Signed)
Mobility Specialist - Progress Note   11/16/21 1258  Mobility  Activity Ambulated with assistance in room  Level of Assistance Contact guard assist, steadying assist  Assistive Device Front wheel walker  Distance Ambulated (ft) 45 ft  Activity Response Tolerated well  Mobility Referral Yes  $Mobility charge 1 Mobility   Pt received in bed and agreed to mobility in room. No c/o pain nor discomfort during ambulation. Pt back to chair with all needs met and family in room.  Roderick Pee Mobility Specialist

## 2021-11-16 NOTE — Plan of Care (Signed)

## 2021-11-16 NOTE — Progress Notes (Signed)
Physical Therapy Treatment Patient Details Name: [name] MRN: [id number] DOB: 13-Jul-1931 Today's Date: 11/16/2021   History of Present Illness [name] is a 88 y.o. female with medical history significant of SVT, tachycardia-bradycardia syndrome, paroxysmal atrial fibrillation, history of atrial flutter, history of ablation, UTI's, CVA,OSA, right Achilles tendinitis, hyperlipidemia, emphysema, hypertensive , sciatica,Recently hospitalized for COVID-19 infection and discharged home on prednisone taper.  comes back 11/09/21 with generalized fatigue with worsening mobility and poor p.o. intake. She was found to have acute metabolic encephalopathy    PT Comments    The patient demonstrates improved strength and able to mobilize with less support.  Patient ambulated x ~ 6' forward and backward(limited by urinary tube).  Patient plans to return home, family not present.  Patient should progress to increase ambulation to return home with 24/7 caregivers.   Recommendations for follow up therapy are one component of a multi-disciplinary discharge planning process, led by the attending physician.  Recommendations may be updated based on patient status, additional functional criteria and insurance authorization.  Follow Up Recommendations  Home health PTif has 24/7 support     Assistance Recommended at Discharge Frequent or constant Supervision/Assistance  Patient can return home with the following A little help with walking and/or transfers;A little help with bathing/dressing/bathroom;Assistance with cooking/housework;Assist for transportation;Help with stairs or ramp for entrance   Equipment Recommendations  None recommended by PT    Recommendations for Other Services       Precautions / Restrictions Precautions Precautions: Fall Precaution Comments: monitor o2 sats - . urinary incontinent at baseline.     Mobility  Bed Mobility   Bed Mobility: Supine to Sit,  Rolling Rolling: Min assist Sidelying to sit: Mod assist       General bed mobility comments: cues for technique, patient pulled  up on PT to sit upright.    Transfers Overall transfer level: Needs assistance Equipment used: Rolling walker (2 wheels) Transfers: Sit to/from Stand, Bed to chair/wheelchair/BSC Sit to Stand: Min assist   Step pivot transfers: Min assist            Ambulation/Gait Ambulation/Gait assistance: Min assist Gait Distance (Feet): 6 Feet (forward and backward.)   Gait Pattern/deviations: Step-to pattern, Step-through pattern       General Gait Details: syepped in place x 10 before walking very short distance.   Stairs             Wheelchair Mobility    Modified Rankin (Stroke Patients Only)       Balance Overall balance assessment: Needs assistance Sitting-balance support: Feet supported Sitting balance-Leahy Scale: Fair     Standing balance support: During functional activity, Bilateral upper extremity supported, Reliant on assistive device for balance Standing balance-Leahy Scale: Poor Standing balance comment: mild posterior  bias                            Cognition Arousal/Alertness: Awake/alert Behavior During Therapy: WFL for tasks assessed/performed                                   General Comments: much more responsive and alert and cheerful. O  to place, mo," Tuesday"        Exercises      General Comments        Pertinent Vitals/Pain Pain Assessment Pain Assessment: No/denies pain    Home Living  Prior Function            PT Goals (current goals can now be found in the care plan section) Progress towards PT goals: Progressing toward goals    Frequency    Min 3X/week      PT Plan Current plan remains appropriate    Co-evaluation              AM-PAC PT "6 Clicks" Mobility   Outcome Measure  Help needed turning from  your back to your side while in a flat bed without using bedrails?: A Lot Help needed moving from lying on your back to sitting on the side of a flat bed without using bedrails?: A Lot Help needed moving to and from a bed to a chair (including a wheelchair)?: A Little Help needed standing up from a chair using your arms (e.g., wheelchair or bedside chair)?: A Little Help needed to walk in hospital room?: Total Help needed climbing 3-5 steps with a railing? : Total 6 Click Score: 12    End of Session Equipment Utilized During Treatment: Gait belt Activity Tolerance: Patient tolerated treatment well Patient left: in chair;with call bell/phone within reach;with chair alarm set Nurse Communication: Mobility status PT Visit Diagnosis: Difficulty in walking, not elsewhere classified (R26.2)     Time: 2574-9355 PT Time Calculation (min) (ACUTE ONLY): 23 min  Charges:  $Gait Training: 8-22 mins $Therapeutic Activity: 8-22 mins                     [place] Office [phone] Weekend pager-[phone]    Claretha Cooper 11/16/2021, 9:12 AM

## 2021-11-16 NOTE — Care Management Important Message (Signed)
Important Message  Patient Details IM Letter given Name: [name] MRN: [id number] Date of Birth: [birth year]   Medicare Important Message Given:  Yes     Kerin Salen 11/16/2021, 2:22 PM

## 2021-11-17 DIAGNOSIS — G9341 Metabolic encephalopathy: Secondary | ICD-10-CM | POA: Diagnosis not present

## 2021-11-17 LAB — CBC WITH DIFFERENTIAL/PLATELET
Abs Immature Granulocytes: 0.9 10*3/uL — ABNORMAL HIGH (ref 0.00–0.07)
Basophils Absolute: 0.1 10*3/uL (ref 0.0–0.1)
Basophils Relative: 0 %
Eosinophils Absolute: 0 10*3/uL (ref 0.0–0.5)
Eosinophils Relative: 0 %
HCT: 34 % — ABNORMAL LOW (ref 36.0–46.0)
Hemoglobin: 11.3 g/dL — ABNORMAL LOW (ref 12.0–15.0)
Immature Granulocytes: 5 %
Lymphocytes Relative: 6 %
Lymphs Abs: 1 10*3/uL (ref 0.7–4.0)
MCH: 32.9 pg (ref 26.0–34.0)
MCHC: 33.2 g/dL (ref 30.0–36.0)
MCV: 99.1 fL (ref 80.0–100.0)
Monocytes Absolute: 0.4 10*3/uL (ref 0.1–1.0)
Monocytes Relative: 2 %
Neutro Abs: 15.3 10*3/uL — ABNORMAL HIGH (ref 1.7–7.7)
Neutrophils Relative %: 87 %
Platelets: 278 10*3/uL (ref 150–400)
RBC: 3.43 MIL/uL — ABNORMAL LOW (ref 3.87–5.11)
RDW: 12.5 % (ref 11.5–15.5)
WBC: 17.7 10*3/uL — ABNORMAL HIGH (ref 4.0–10.5)
nRBC: 0 % (ref 0.0–0.2)

## 2021-11-17 NOTE — Discharge Summary (Signed)
Physician Discharge Summary  [name] NFA:[id number] DOB: Jan 02, 1932 DOA: 11/10/2021  PCP: Crist Infante, MD  Admit date: 11/10/2021 Discharge date: 11/17/2021  Admitted From: Home Disposition: Home  Recommendations for Outpatient Follow-up:  Follow up with PCP in 1-2 weeks Please obtain BMP/CBC in one week Please follow up on the following pending results:  Home Health: PT/OT  Equipment/Devices: None  Discharge Condition: Stable CODE STATUS: Full code Diet recommendation: Regular diet, nutritional supplements  Discharge Summary: Patient with history of paroxysmal atrial fibrillation and flutter, hypertension, obstructive sleep apnea, hyperlipidemia, stroke, chronic UTI on suppressive antibiotic therapy and recently admitted with COVID-19 infection, discharged home on prednisone taper presented back to the emergency room with fatigue and weakness, poor oral intake and low-grade temperature at home.  In the emergency room she had temperature 100.5.  Repeat COVID testing was positive with CT threshold less than 22.  Urine cultures and blood cultures were sent and patient was admitted for treatment of infection.  She was noted to have Enterococcus in urine, treated with Rocephin and then subsequently with Augmentin.  Patient continued to have low-grade fever and ID was consulted.  Ultimately clinically improved and going home today.   Recurrent fever and persistent COVID-19 infection: Blood cultures negative Cultures with Enterococcus, already received adequate antibiotics. Repeat cultures 10/21 negative to date. Clinically improving.  CT scan chest abdomen pelvis without any evidence of infection.  Procalcitonin normal. Patient monitored without antibiotics and remained afebrile for last 48 hours. Gram-positive bacteremia with Staph epidermidis is likely contaminant. No more antibiotic treatment is needed. Mental status improved. Electrolytes are adequate.  Chronic issues  including Paroxysmal A-fib  Essential hypertension  Obstructive sleep apnea, remained stable.    Medically stable.  Going home with home health therapies with her daughter.    Discharge Diagnoses:  Principal Problem:   Acute metabolic encephalopathy Active Problems:   PAF (paroxysmal atrial fibrillation) (HCC)   History of stroke   Essential hypertension   Hyperlipidemia LDL goal <70   Chronic UTI   Obstructive sleep apnea   COVID-19 virus infection   Abnormal LFTs   Generalized weakness   Fever   Hypokalemia   FUO (fever of unknown origin)   Asymptomatic bacteriuria    Discharge Instructions  Discharge Instructions     Diet - low sodium heart healthy   Complete by: As directed    Increase activity slowly   Complete by: As directed       Allergies as of 11/17/2021       Reactions   Bee Venom Anaphylaxis   Mixed Vespid Venom Anaphylaxis   Codeine Other (See Comments)   Exact allergic reaction not known   Sulfa Antibiotics Itching, Nausea Only   Erythromycin Nausea And Vomiting        Medication List     STOP taking these medications    predniSONE 10 MG (21) Tbpk tablet Commonly known as: STERAPRED UNI-PAK 21 TAB       TAKE these medications    amLODipine 2.5 MG tablet Commonly known as: NORVASC Take 1 tablet (2.5 mg total) by mouth daily.   apixaban 5 MG Tabs tablet Commonly known as: Eliquis Take 1 tablet (5 mg total) by mouth 2 (two) times daily.   ascorbic acid 500 MG tablet Commonly known as: VITAMIN C Take 1 tablet (500 mg total) by mouth daily.   atorvastatin 10 MG tablet Commonly known as: LIPITOR Take 1 tablet (10 mg total) by mouth at bedtime. Hold for  a week until repeat LFTs are obtained and imprvoing   cephALEXin 250 MG capsule Commonly known as: KEFLEX Take 250 mg by mouth daily.   CRANBERRY PO Take 650 mg by mouth daily.   FIBER ADULT GUMMIES PO Take 2 tablets by mouth daily.   Gemtesa 75 MG Tabs Generic drug:  Vibegron Take 75 mg by mouth daily.   guaiFENesin-dextromethorphan 100-10 MG/5ML syrup Commonly known as: ROBITUSSIN DM Take 10 mLs by mouth every 4 (four) hours as needed for cough.   Ipratropium-Albuterol 20-100 MCG/ACT Aers respimat Commonly known as: COMBIVENT Inhale 1 puff into the lungs every 6 (six) hours as needed for wheezing or shortness of breath.   ondansetron 4 MG tablet Commonly known as: ZOFRAN Take 1 tablet (4 mg total) by mouth every 6 (six) hours as needed for nausea.   oxyCODONE 5 MG immediate release tablet Commonly known as: Oxy IR/ROXICODONE Take 5 mg by mouth every 4 (four) hours as needed (for pain). What changed: Another medication with the same name was removed. Continue taking this medication, and follow the directions you see here.   [facility] Colon Health Caps Take 1 capsule by mouth daily.   SENIOR VITES PO Take 1 tablet by mouth daily with breakfast.   VITAMIN B12 PO Take 1 tablet by mouth at bedtime.   vitamin D3 50 MCG (2000 UT) Caps Take 2,000 Units by mouth at bedtime.   zinc sulfate 220 (50 Zn) MG capsule Take 1 capsule (220 mg total) by mouth daily.        Allergies  Allergen Reactions   Bee Venom Anaphylaxis   Mixed Vespid Venom Anaphylaxis   Codeine Other (See Comments)    Exact allergic reaction not known   Sulfa Antibiotics Itching and Nausea Only   Erythromycin Nausea And Vomiting    Consultations: Infectious disease   Procedures/Studies: CT CHEST ABDOMEN PELVIS W CONTRAST  Result Date: 11/15/2021 CLINICAL DATA:  Sepsis, fever EXAM: CT CHEST, ABDOMEN, AND PELVIS WITH CONTRAST TECHNIQUE: Multidetector CT imaging of the chest, abdomen and pelvis was performed following the standard protocol during bolus administration of intravenous contrast. RADIATION DOSE REDUCTION: This exam was performed according to the departmental dose-optimization program which includes automated exposure control, adjustment of the mA and/or kV  according to patient size and/or use of iterative reconstruction technique. CONTRAST:  181m OMNIPAQUE IOHEXOL 300 MG/ML  SOLN COMPARISON:  Previous studies including CT abdomen and pelvis done on 10/29/2021, CT chest done on 01/21/2020 FINDINGS: CT CHEST FINDINGS Cardiovascular: Major vascular structures are unremarkable. Heart is enlarged in size. Calcifications are noted in mitral annulus. Mediastinum/Nodes: There are slightly enlarged lymph nodes in mediastinum with no significant interval change. Lungs/Pleura: Centrilobular and panlobular emphysema is seen. Increased interstitial markings are seen in the periphery of both lungs, more so in left upper lobe. Findings suggest chronic interstitial lung disease with scarring. There is no focal pulmonary consolidation. There is no pleural effusion or pneumothorax. There is small plaque-like pleural density in the posterolateral aspect of right lower lung field which has not changed, possibly scarring. Musculoskeletal: No acute findings are seen in bony structures in the thorax. CT ABDOMEN PELVIS FINDINGS Hepatobiliary: There is fatty infiltration in liver. There is 9 mm low-density in the right lobe of liver with no change, possibly a cyst. There is no dilation of bile ducts. Gallbladder is not distended. There is no fluid around the gallbladder. Pancreas: No focal abnormalities are seen. Spleen: Unremarkable. Adrenals/Urinary Tract: Adrenals are unremarkable. There is no  hydronephrosis. There are no renal or ureteral stones. Urinary bladder is unremarkable. Stomach/Bowel: Stomach is distended with fluid in the lumen. There is no focal wall thickening in stomach. There is no significant small bowel dilation. Appendix is not dilated. There is no significant wall thickening in colon. Scattered diverticula seen in colon without signs of focal diverticulitis. Vascular/Lymphatic: Atherosclerotic plaques and calcifications are seen in aorta and its major branches.  Reproductive: Uterus is not seen. Other: There is no ascites or pneumoperitoneum. Musculoskeletal: Slight decrease in height of body of L2 vertebra appears stable. There is fusion of bodies L3 and L4 vertebrae. Degenerative changes are noted with disc space narrowing, bony spurs and facet hypertrophy in the lumbar spine and lower thoracic spine. IMPRESSION: No acute findings are seen in CT scan of chest, abdomen and pelvis. There is no focal pulmonary consolidation. There is no pleural effusion. Chronic interstitial lung disease with scarring. There is no evidence of intestinal obstruction or pneumoperitoneum. There is no hydronephrosis. Appendix is not dilated. Fatty liver. Scattered diverticula are seen in colon without signs of focal diverticulitis. Arteriosclerosis. Lumbar spondylosis. Other findings as described in the body of the report. Electronically Signed   By: Elmer Picker M.D.   On: 11/15/2021 14:56   DG Chest Port 1 View  Result Date: 11/14/2021 CLINICAL DATA:  88 year old female with history of shortness of breath. EXAM: PORTABLE CHEST 1 VIEW COMPARISON:  Chest x-ray 11/12/2021. FINDINGS: Widespread but patchy areas of interstitial prominence and peribronchial cuffing are noted throughout the lungs bilaterally, most severe in the medial aspect of the right lung base and in the periphery of the left upper lobe near the apex. No confluent consolidative airspace disease. No pleural effusions. No pneumothorax. No evidence of pulmonary edema. Heart size is borderline enlarged. Upper mediastinal contours are within normal limits. Atherosclerotic calcifications are noted in the thoracic aorta. IMPRESSION: 1. Increasing areas of interstitial prominence and peribronchial cuffing asymmetrically distributed in the lungs bilaterally concerning for progressive bronchitis, potentially with developing multilobar bilateral bronchopneumonia. 2. Aortic atherosclerosis. Electronically Signed   By: Vinnie Langton M.D.   On: 11/14/2021 06:31   DG Chest 1 View  Result Date: 11/12/2021 CLINICAL DATA:  Shortness of breath. EXAM: CHEST  1 VIEW COMPARISON:  One-view chest x-ray 11/10/2021 FINDINGS: Patient is rotated the left since the prior exam. This exaggerates the heart size. Mild pulmonary vascular congestion is increased slightly. No focal airspace disease is present. Remote right-sided rib fractures noted. IMPRESSION: Slight increase in mild pulmonary vascular congestion. Electronically Signed   By: San Morelle M.D.   On: 11/12/2021 15:38   DG Chest Port 1 View  Result Date: 11/10/2021 CLINICAL DATA:  Shortness of breath EXAM: PORTABLE CHEST 1 VIEW COMPARISON:  11/01/2021 FINDINGS: The heart size and mediastinal contours are within normal limits. Aortic atherosclerosis. No focal airspace consolidation, pleural effusion, or pneumothorax. The visualized skeletal structures are unremarkable. IMPRESSION: No active disease. Electronically Signed   By: Davina Poke D.O.   On: 11/10/2021 10:13   DG CHEST PORT 1 VIEW  Result Date: 11/01/2021 CLINICAL DATA:  COVID, dyspnea EXAM: PORTABLE CHEST 1 VIEW COMPARISON:  Prior chest x-ray 10/29/2021 FINDINGS: Stable cardiac and mediastinal contours. Atherosclerotic calcifications again noted in the transverse aorta. Similar appearance of hazy airspace opacity overlying the left lung base. The remainder of the lungs are clear. No pneumothorax. Stable chronic bronchitic changes. IMPRESSION: Stable appearance of the chest with probable small layering left pleural effusion and associated left basilar atelectasis  versus infiltrate. Electronically Signed   By: Jacqulynn Cadet M.D.   On: 11/01/2021 07:29   CT Head Wo Contrast  Result Date: 10/29/2021 CLINICAL DATA:  Altered mental status EXAM: CT HEAD WITHOUT CONTRAST TECHNIQUE: Contiguous axial images were obtained from the base of the skull through the vertex without intravenous contrast. RADIATION DOSE  REDUCTION: This exam was performed according to the departmental dose-optimization program which includes automated exposure control, adjustment of the mA and/or kV according to patient size and/or use of iterative reconstruction technique. COMPARISON:  06/09/2020 FINDINGS: Brain: No evidence of acute infarction, hemorrhage, hydrocephalus, extra-axial collection or mass lesion/mass effect. Mild atrophic and chronic white matter ischemic changes are noted. Vascular: No hyperdense vessel or unexpected calcification. Skull: Normal. Negative for fracture or focal lesion. Sinuses/Orbits: No acute finding. Other: None. IMPRESSION: Chronic atrophic and ischemic changes without acute abnormality. Electronically Signed   By: Inez Catalina M.D.   On: 10/29/2021 21:13   CT Abdomen Pelvis W Contrast  Result Date: 10/29/2021 CLINICAL DATA:  Sepsis EXAM: CT ABDOMEN AND PELVIS WITH CONTRAST TECHNIQUE: Multidetector CT imaging of the abdomen and pelvis was performed using the standard protocol following bolus administration of intravenous contrast. RADIATION DOSE REDUCTION: This exam was performed according to the departmental dose-optimization program which includes automated exposure control, adjustment of the mA and/or kV according to patient size and/or use of iterative reconstruction technique. CONTRAST:  63m OMNIPAQUE IOHEXOL 300 MG/ML  SOLN COMPARISON:  12/20/2019 FINDINGS: Lower chest: No acute abnormality.  Small hiatal hernia Hepatobiliary: Tiny probable cyst within the right hepatic dome. Liver otherwise unremarkable. Gallbladder unremarkable. No intra or extrahepatic biliary ductal dilation. Pancreas: Unremarkable Spleen: Unremarkable Adrenals/Urinary Tract: Adrenal glands are unremarkable. Kidneys are normal, without renal calculi, focal lesion, or hydronephrosis. Bladder is unremarkable. Stomach/Bowel: Moderate sigmoid diverticulosis. The stomach, small bowel, and large bowel are otherwise unremarkable. No  evidence of obstruction or focal inflammation. Appendix normal. No free intraperitoneal gas or fluid. Vascular/Lymphatic: Aortic atherosclerosis. No enlarged abdominal or pelvic lymph nodes. Reproductive: Status post hysterectomy. No adnexal masses. Other: No abdominal wall hernia or abnormality. No abdominopelvic ascites. Musculoskeletal: Osseous structures are age-appropriate. No acute bone abnormality. No lytic or blastic bone lesion. IMPRESSION: 1. No acute intra-abdominal pathology identified. No definite radiographic explanation for the patient's reported sepsis. 2. Small hiatal hernia. 3. Moderate sigmoid diverticulosis without superimposed acute inflammatory change. Aortic Atherosclerosis (ICD10-I70.0). Electronically Signed   By: AFidela SalisburyM.D.   On: 10/29/2021 20:58   DG Chest 1 View  Result Date: 10/29/2021 CLINICAL DATA:  Pt BIBA from home. Pt c/o back and shoulder pain- has had MRI- was dx with sciatica. Pt has been taking prednisone and tramadol with minimal releif, but has been able to perform ADLs. EXAM: CHEST  1 VIEW COMPARISON:  06/09/2020 FINDINGS: Lungs are clear. Borderline cardiomegaly.  Aortic Atherosclerosis (ICD10-170.0). Chronic blunting of the left lateral costophrenic angle. No pneumothorax. Visualized bones unremarkable. IMPRESSION: Stable chronic findings.  No acute cardiopulmonary disease. Electronically Signed   By: DLucrezia EuropeM.D.   On: 10/29/2021 20:56   DG Hip Unilat With Pelvis 2-3 Views Right  Result Date: 10/29/2021 CLINICAL DATA:  Hip pain. EXAM: DG HIP (WITH OR WITHOUT PELVIS) 2-3V RIGHT COMPARISON:  CT abdomen and pelvis 12/20/2019 FINDINGS: There is no evidence of hip fracture or dislocation. Joint spaces are maintained. There are mild degenerative changes of both hips with acetabular sclerosis and osteophyte formation. There also degenerative changes of the lower lumbar spine. Soft tissues are within  normal limits. IMPRESSION: 1. No acute fracture or  dislocation. 2. Mild degenerative changes of both hips. Electronically Signed   By: Ronney Asters M.D.   On: 10/29/2021 18:13   MR LUMBAR SPINE WO CONTRAST  Result Date: 10/23/2021 CLINICAL DATA:  Sciatica. EXAM: MRI LUMBAR SPINE WITHOUT CONTRAST TECHNIQUE: Multiplanar, multisequence MR imaging of the lumbar spine was performed. No intravenous contrast was administered. COMPARISON:  None Available. FINDINGS: Segmentation:  Standard Alignment:  No substantial sagittal subluxation. Vertebrae: T12-L1 and L2-L3 degenerative/discogenic endplate signal changes. Conus medullaris and cauda equina: Conus extends to the L1-L2 level. Conus appears normal. Paraspinal and other soft tissues: Unremarkable. Disc levels: T12-L1: Slight disc bulging and facet arthropathy. No significant stenosis. L1-L2: Mild disc bulging and facet arthropathy. No significant stenosis. L2-L3: Mild disc bulging and facet arthropathy. No significant stenosis. L3-L4: Loss bulging and facet arthropathy.  No significant stenosis. L4-L5: Disc bulging and endplate spurring with bilateral facet arthropathy. Resulting mild left foraminal stenosis without significant canal or right foraminal stenosis. L5-S1: Disc bulging and facet arthropathy without significant stenosis. IMPRESSION: Multilevel degenerative change with mild foraminal stenosis on the left at L4-L5. Electronically Signed   By: Margaretha Sheffield M.D.   On: 10/23/2021 14:57   (Echo, Carotid, EGD, Colonoscopy, ERCP)    Subjective: Patient seen in the morning rounds.  Daughter at the bedside.  Denied any complaints.  Eager to go home.  She was tearful about her dream that she dreamt about the [place] war.    Discharge Exam: Vitals:   11/16/21 2029 11/17/21 0649  BP: 130/69 (!) 152/87  Pulse: 81 64  Resp: 18 18  Temp: 97.8 F (36.6 C) (!) 97.5 F (36.4 C)  SpO2: 93% 93%   Vitals:   11/16/21 0557 11/16/21 1525 11/16/21 2029 11/17/21 0649  BP: (!) 143/70 135/83 130/69 (!)  152/87  Pulse: 67 73 81 64  Resp: '20  18 18  '$ Temp: 97.6 F (36.4 C) (!) 97.5 F (36.4 C) 97.8 F (36.6 C) (!) 97.5 F (36.4 C)  TempSrc: Oral Oral Oral Oral  SpO2: 96% 97% 93% 93%  Weight:      Height:        General: Pt is alert, awake, not in acute distress Cardiovascular: RRR, S1/S2 +, no rubs, no gallops Respiratory: CTA bilaterally, no wheezing, no rhonchi Abdominal: Soft, NT, ND, bowel sounds + Extremities: no edema, no cyanosis    The results of significant diagnostics from this hospitalization (including imaging, microbiology, ancillary and laboratory) are listed below for reference.     Microbiology: Recent Results (from the past 240 hour(s))  Resp Panel by RT-PCR (Flu A&B, Covid) Anterior Nasal Swab     Status: Abnormal   Collection Time: 11/10/21  8:43 AM   Specimen: Anterior Nasal Swab  Result Value Ref Range Status   SARS Coronavirus 2 by RT PCR POSITIVE (A) NEGATIVE Final    Comment: (NOTE) SARS-CoV-2 target nucleic acids are DETECTED.  The SARS-CoV-2 RNA is generally detectable in upper respiratory specimens during the acute phase of infection. Positive results are indicative of the presence of the identified virus, but do not rule out bacterial infection or co-infection with other pathogens not detected by the test. Clinical correlation with patient history and other diagnostic information is necessary to determine patient infection status. The expected result is Negative.  Fact Sheet for Patients: EntrepreneurPulse.com.au  Fact Sheet for Healthcare Providers: IncredibleEmployment.be  This test is not yet approved or cleared by the [place] FDA  and  has been authorized for detection and/or diagnosis of SARS-CoV-2 by FDA under an Emergency Use Authorization (EUA).  This EUA will remain in effect (meaning this test can be used) for the duration of  the COVID-19 declaration under Section 564(b)(1) of the A ct,  21 U.S.C. section 360bbb-3(b)(1), unless the authorization is terminated or revoked sooner.     Influenza A by PCR NEGATIVE NEGATIVE Final   Influenza B by PCR NEGATIVE NEGATIVE Final    Comment: (NOTE) The Xpert Xpress SARS-CoV-2/FLU/RSV plus assay is intended as an aid in the diagnosis of influenza from Nasopharyngeal swab specimens and should not be used as a sole basis for treatment. Nasal washings and aspirates are unacceptable for Xpert Xpress SARS-CoV-2/FLU/RSV testing.  Fact Sheet for Patients: EntrepreneurPulse.com.au  Fact Sheet for Healthcare Providers: IncredibleEmployment.be  This test is not yet approved or cleared by the [place] FDA and has been authorized for detection and/or diagnosis of SARS-CoV-2 by FDA under an Emergency Use Authorization (EUA). This EUA will remain in effect (meaning this test can be used) for the duration of the COVID-19 declaration under Section 564(b)(1) of the Act, 21 U.S.C. section 360bbb-3(b)(1), unless the authorization is terminated or revoked.  Performed at [facility], [place] [address], [place], [place] [address]   Culture, blood (routine x 2)     Status: None   Collection Time: 11/10/21  9:04 AM   Specimen: BLOOD  Result Value Ref Range Status   Specimen Description   Final    BLOOD LEFT ANTECUBITAL Performed at [facility] [address] [place], [place] [address]    Special Requests   Final    BOTTLES DRAWN AEROBIC AND ANAEROBIC Blood Culture adequate volume Performed at [facility] [address], [place], [place] [address]    Culture   Final    NO GROWTH 5 DAYS Performed at [facility], [place] [address], [place], [place] [address]    Report Status 11/15/2021 FINAL  Final  Culture, blood (routine x 2)     Status: Abnormal   Collection Time: 11/10/21  9:05 AM   Specimen: BLOOD  Result Value Ref Range Status    Specimen Description   Final    BLOOD LEFT ANTECUBITAL Performed at [facility] [address] [place], [place] [address]    Special Requests   Final    BOTTLES DRAWN AEROBIC ONLY Blood Culture results may not be optimal due to an inadequate volume of blood received in culture bottles Performed at [facility] [address] [place], [place] [address]    Culture  Setup Time   Final    GRAM POSITIVE COCCI IN CLUSTERS AEROBIC BOTTLE ONLY CRITICAL RESULT CALLED TO, READ BACK BY AND VERIFIED WITH: PHARMD E JACKSON 11/12/21 '@107'$  BY AB    Culture (A)  Final    STAPHYLOCOCCUS EPIDERMIDIS THE SIGNIFICANCE OF ISOLATING THIS ORGANISM FROM A SINGLE SET OF BLOOD CULTURES WHEN MULTIPLE SETS ARE DRAWN IS UNCERTAIN. PLEASE NOTIFY THE MICROBIOLOGY DEPARTMENT WITHIN ONE WEEK IF SPECIATION AND SENSITIVITIES ARE REQUIRED. Performed at [facility], [place] [address] [place],  [address]    Report Status 11/13/2021 FINAL  Final  Blood Culture ID Panel (Reflexed)     Status: Abnormal   Collection Time: 11/10/21  9:05 AM  Result Value Ref Range Status   Enterococcus faecalis NOT DETECTED NOT DETECTED Final   Enterococcus Faecium NOT DETECTED NOT DETECTED  Final   Listeria monocytogenes NOT DETECTED NOT DETECTED Final   Staphylococcus species DETECTED (A) NOT DETECTED Final    Comment: CRITICAL RESULT CALLED TO, READ BACK BY AND VERIFIED WITH: PHARMD E JACKSON 11/12/21 '@107'$  BY AB    Staphylococcus aureus (BCID) NOT DETECTED NOT DETECTED Final   Staphylococcus epidermidis DETECTED (A) NOT DETECTED Final    Comment: Methicillin (oxacillin) resistant coagulase negative staphylococcus. Possible blood culture contaminant (unless isolated from more than one blood culture draw or clinical case suggests pathogenicity). No antibiotic treatment is indicated for blood  culture contaminants. CRITICAL RESULT CALLED TO, READ BACK BY AND VERIFIED WITH: PHARMD E  JACKSON 11/12/21 '@107'$  BY AB    Staphylococcus lugdunensis NOT DETECTED NOT DETECTED Final   Streptococcus species NOT DETECTED NOT DETECTED Final   Streptococcus agalactiae NOT DETECTED NOT DETECTED Final   Streptococcus pneumoniae NOT DETECTED NOT DETECTED Final   Streptococcus pyogenes NOT DETECTED NOT DETECTED Final   A.calcoaceticus-baumannii NOT DETECTED NOT DETECTED Final   Bacteroides fragilis NOT DETECTED NOT DETECTED Final   Enterobacterales NOT DETECTED NOT DETECTED Final   Enterobacter cloacae complex NOT DETECTED NOT DETECTED Final   Escherichia coli NOT DETECTED NOT DETECTED Final   Klebsiella aerogenes NOT DETECTED NOT DETECTED Final   Klebsiella oxytoca NOT DETECTED NOT DETECTED Final   Klebsiella pneumoniae NOT DETECTED NOT DETECTED Final   Proteus species NOT DETECTED NOT DETECTED Final   Salmonella species NOT DETECTED NOT DETECTED Final   Serratia marcescens NOT DETECTED NOT DETECTED Final   Haemophilus influenzae NOT DETECTED NOT DETECTED Final   Neisseria meningitidis NOT DETECTED NOT DETECTED Final   Pseudomonas aeruginosa NOT DETECTED NOT DETECTED Final   Stenotrophomonas maltophilia NOT DETECTED NOT DETECTED Final   Candida albicans NOT DETECTED NOT DETECTED Final   Candida auris NOT DETECTED NOT DETECTED Final   Candida glabrata NOT DETECTED NOT DETECTED Final   Candida krusei NOT DETECTED NOT DETECTED Final   Candida parapsilosis NOT DETECTED NOT DETECTED Final   Candida tropicalis NOT DETECTED NOT DETECTED Final   Cryptococcus neoformans/gattii NOT DETECTED NOT DETECTED Final   Methicillin resistance mecA/C DETECTED (A) NOT DETECTED Final    Comment: CRITICAL RESULT CALLED TO, READ BACK BY AND VERIFIED WITH: PHARMD E JACKSON 11/12/21 '@107'$  BY AB Performed at [facility], [address], [place], [place] [address]   Urine Culture     Status: Abnormal   Collection Time: 11/10/21 12:17 PM   Specimen: Urine, Clean Catch  Result Value Ref Range Status    Specimen Description   Final    URINE, CLEAN CATCH Performed at [facility], [place] [address] [place], [place] [address]    Special Requests   Final    NONE Performed at [facility], [place] [address] [place], [place] [address]    Culture 30,000 COLONIES/mL ENTEROCOCCUS FAECALIS (A)  Final   Report Status 11/12/2021 FINAL  Final   Organism ID, Bacteria ENTEROCOCCUS FAECALIS (A)  Final      Susceptibility   Enterococcus faecalis - MIC*    AMPICILLIN <=2 SENSITIVE Sensitive     NITROFURANTOIN <=16 SENSITIVE Sensitive     VANCOMYCIN 1 SENSITIVE Sensitive     * 30,000 COLONIES/mL ENTEROCOCCUS FAECALIS  Respiratory (~20 pathogens) panel by PCR     Status: None   Collection Time: 11/14/21 10:21 AM   Specimen: Nasopharyngeal Swab; Respiratory  Result Value Ref Range Status   Adenovirus NOT DETECTED NOT DETECTED Final   Coronavirus  229E NOT DETECTED NOT DETECTED Final    Comment: (NOTE) The Coronavirus on the Respiratory Panel, DOES NOT test for the novel  Coronavirus (2019 nCoV)    Coronavirus HKU1 NOT DETECTED NOT DETECTED Final   Coronavirus NL63 NOT DETECTED NOT DETECTED Final   Coronavirus OC43 NOT DETECTED NOT DETECTED Final   Metapneumovirus NOT DETECTED NOT DETECTED Final   Rhinovirus / Enterovirus NOT DETECTED NOT DETECTED Final   Influenza A NOT DETECTED NOT DETECTED Final   Influenza B NOT DETECTED NOT DETECTED Final   Parainfluenza Virus 1 NOT DETECTED NOT DETECTED Final   Parainfluenza Virus 2 NOT DETECTED NOT DETECTED Final   Parainfluenza Virus 3 NOT DETECTED NOT DETECTED Final   Parainfluenza Virus 4 NOT DETECTED NOT DETECTED Final   Respiratory Syncytial Virus NOT DETECTED NOT DETECTED Final   Bordetella pertussis NOT DETECTED NOT DETECTED Final   Bordetella Parapertussis NOT DETECTED NOT DETECTED Final   Chlamydophila pneumoniae NOT DETECTED NOT DETECTED Final   Mycoplasma pneumoniae NOT DETECTED NOT DETECTED Final     Comment: Performed at [facility], [place] [address] [place], [place] [address]  Culture, blood (Routine X 2) w Reflex to ID Panel     Status: None (Preliminary result)   Collection Time: 11/14/21 11:19 AM   Specimen: BLOOD  Result Value Ref Range Status   Specimen Description   Final    BLOOD RIGHT ANTECUBITAL Performed at [facility] [address] [place], [place] [address]    Special Requests   Final    BOTTLES DRAWN AEROBIC ONLY Blood Culture adequate volume Performed at [facility] [address], [place], [place] [address]    Culture   Final    NO GROWTH 3 DAYS Performed at [facility], [place] [address], [place], [place] [address]    Report Status PENDING  Incomplete  Culture, blood (Routine X 2) w Reflex to ID Panel     Status: None (Preliminary result)   Collection Time: 11/14/21 11:19 AM   Specimen: BLOOD  Result Value Ref Range Status   Specimen Description   Final    BLOOD BLOOD RIGHT HAND Performed at [facility] [address], [place], [place] [address]    Special Requests   Final    BOTTLES DRAWN AEROBIC ONLY Blood Culture adequate volume Performed at [place] [address], [place], [place] [address]    Culture   Final    NO GROWTH 3 DAYS Performed at [facility], [place] [address] [place],  [address]    Report Status PENDING  Incomplete     Labs: BNP (last 3 results) No results for input(s): "BNP" in the last 8760 hours. Basic Metabolic Panel: Recent Labs  Lab 11/11/21 0543 11/12/21 0512 11/13/21 0531 11/15/21 0542 11/16/21 0453  NA 139 137 137 139 139  K 3.8 3.2* 4.5 3.7 3.8  CL 111 108 108 104 103  CO2 '24 22 25 28 25  '$ GLUCOSE 130* 137* 104* 193* 212*  BUN '16 13 11 22 23  '$ CREATININE 0.87 0.61 0.76 0.80 0.76  CALCIUM 7.7* 7.5* 8.0* 8.6* 8.7*  MG 2.2 2.0  --  2.5* 2.3   Liver Function Tests: Recent Labs  Lab 11/11/21 0543  AST 29   ALT 72*  ALKPHOS 41  BILITOT 0.9  PROT 5.4*  ALBUMIN 2.7*   No results for input(s): "LIPASE", "AMYLASE" in the last 168 hours. No results  for input(s): "AMMONIA" in the last 168 hours. CBC: Recent Labs  Lab 11/11/21 0543 11/12/21 0512 11/13/21 0531 11/15/21 0542 11/16/21 0453 11/17/21 0510  WBC 8.6 7.2 7.4 5.5 15.7* 17.7*  NEUTROABS 6.3  --   --   --   --  15.3*  HGB 13.9 12.3 13.0 12.5 11.6* 11.3*  HCT 43.2 37.3 40.5 37.6 34.8* 34.0*  MCV 102.1* 100.0 103.1* 98.9 98.3 99.1  PLT 118* 106* 119* 161 221 278   Cardiac Enzymes: No results for input(s): "CKTOTAL", "CKMB", "CKMBINDEX", "TROPONINI" in the last 168 hours. BNP: Invalid input(s): "POCBNP" CBG: No results for input(s): "GLUCAP" in the last 168 hours. D-Dimer No results for input(s): "DDIMER" in the last 72 hours. Hgb A1c No results for input(s): "HGBA1C" in the last 72 hours. Lipid Profile No results for input(s): "CHOL", "HDL", "LDLCALC", "TRIG", "CHOLHDL", "LDLDIRECT" in the last 72 hours. Thyroid function studies No results for input(s): "TSH", "T4TOTAL", "T3FREE", "THYROIDAB" in the last 72 hours.  Invalid input(s): "FREET3" Anemia work up No results for input(s): "VITAMINB12", "FOLATE", "FERRITIN", "TIBC", "IRON", "RETICCTPCT" in the last 72 hours. Urinalysis    Component Value Date/Time   COLORURINE YELLOW 11/10/2021 1217   APPEARANCEUR CLOUDY (A) 11/10/2021 1217   LABSPEC 1.005 11/10/2021 1217   PHURINE 7.0 11/10/2021 1217   GLUCOSEU NEGATIVE 11/10/2021 1217   HGBUR MODERATE (A) 11/10/2021 1217   BILIRUBINUR NEGATIVE 11/10/2021 1217   KETONESUR NEGATIVE 11/10/2021 1217   PROTEINUR NEGATIVE 11/10/2021 1217   NITRITE NEGATIVE 11/10/2021 1217   LEUKOCYTESUR LARGE (A) 11/10/2021 1217   Sepsis Labs Recent Labs  Lab 11/13/21 0531 11/15/21 0542 11/16/21 0453 11/17/21 0510  WBC 7.4 5.5 15.7* 17.7*   Microbiology Recent Results (from the past 240 hour(s))  Resp Panel by RT-PCR (Flu A&B,  Covid) Anterior Nasal Swab     Status: Abnormal   Collection Time: 11/10/21  8:43 AM   Specimen: Anterior Nasal Swab  Result Value Ref Range Status   SARS Coronavirus 2 by RT PCR POSITIVE (A) NEGATIVE Final    Comment: (NOTE) SARS-CoV-2 target nucleic acids are DETECTED.  The SARS-CoV-2 RNA is generally detectable in upper respiratory specimens during the acute phase of infection. Positive results are indicative of the presence of the identified virus, but do not rule out bacterial infection or co-infection with other pathogens not detected by the test. Clinical correlation with patient history and other diagnostic information is necessary to determine patient infection status. The expected result is Negative.  Fact Sheet for Patients: EntrepreneurPulse.com.au  Fact Sheet for Healthcare Providers: IncredibleEmployment.be  This test is not yet approved or cleared by the [place] FDA and  has been authorized for detection and/or diagnosis of SARS-CoV-2 by FDA under an Emergency Use Authorization (EUA).  This EUA will remain in effect (meaning this test can be used) for the duration of  the COVID-19 declaration under Section 564(b)(1) of the A ct, 21 U.S.C. section 360bbb-3(b)(1), unless the authorization is terminated or revoked sooner.     Influenza A by PCR NEGATIVE NEGATIVE Final   Influenza B by PCR NEGATIVE NEGATIVE Final    Comment: (NOTE) The Xpert Xpress SARS-CoV-2/FLU/RSV plus assay is intended as an aid in the diagnosis of influenza from Nasopharyngeal swab specimens and should not be used as a sole basis for treatment. Nasal washings and aspirates are unacceptable for Xpert Xpress SARS-CoV-2/FLU/RSV testing.  Fact Sheet for Patients: EntrepreneurPulse.com.au  Fact Sheet for Healthcare Providers: IncredibleEmployment.be  This test is not yet approved or  cleared by the [place]  and has been authorized for detection and/or diagnosis of SARS-CoV-2 by FDA under an Emergency Use Authorization (EUA). This EUA will remain in effect (meaning this test can be used) for the duration of the COVID-19 declaration under Section 564(b)(1) of the Act, 21 U.S.C. section 360bbb-3(b)(1), unless the authorization is terminated or revoked.  Performed at [facility], [place] [address], [place], [place] [address]   Culture, blood (routine x 2)     Status: None   Collection Time: 11/10/21  9:04 AM   Specimen: BLOOD  Result Value Ref Range Status   Specimen Description   Final    BLOOD LEFT ANTECUBITAL Performed at [facility] [address] [place], [place] [address]    Special Requests   Final    BOTTLES DRAWN AEROBIC AND ANAEROBIC Blood Culture adequate volume Performed at [facility] [address], [place], [place] [address]    Culture   Final    NO GROWTH 5 DAYS Performed at [facility], [place] [address] [place], [place] [address]    Report Status 11/15/2021 FINAL  Final  Culture, blood (routine x 2)     Status: Abnormal   Collection Time: 11/10/21  9:05 AM   Specimen: BLOOD  Result Value Ref Range Status   Specimen Description   Final    BLOOD LEFT ANTECUBITAL Performed at [facility] [address] [place], [place] [address]    Special Requests   Final    BOTTLES DRAWN AEROBIC ONLY Blood Culture results may not be optimal due to an inadequate volume of blood received in culture bottles Performed at [place] [address] [place], [place] [address]    Culture  Setup Time   Final    GRAM POSITIVE COCCI IN CLUSTERS AEROBIC BOTTLE ONLY CRITICAL RESULT CALLED TO, READ BACK BY AND VERIFIED WITH: PHARMD E JACKSON 11/12/21 '@107'$  BY AB    Culture (A)  Final    STAPHYLOCOCCUS EPIDERMIDIS THE SIGNIFICANCE OF ISOLATING THIS ORGANISM FROM A SINGLE SET OF BLOOD  CULTURES WHEN MULTIPLE SETS ARE DRAWN IS UNCERTAIN. PLEASE NOTIFY THE MICROBIOLOGY DEPARTMENT WITHIN ONE WEEK IF SPECIATION AND SENSITIVITIES ARE REQUIRED. Performed at [facility], [place] [address], [place], [place] [address]    Report Status 11/13/2021 FINAL  Final  Blood Culture ID Panel (Reflexed)     Status: Abnormal   Collection Time: 11/10/21  9:05 AM  Result Value Ref Range Status   Enterococcus faecalis NOT DETECTED NOT DETECTED Final   Enterococcus Faecium NOT DETECTED NOT DETECTED Final   Listeria monocytogenes NOT DETECTED NOT DETECTED Final   Staphylococcus species DETECTED (A) NOT DETECTED Final    Comment: CRITICAL RESULT CALLED TO, READ BACK BY AND VERIFIED WITH: PHARMD E JACKSON 11/12/21 '@107'$  BY AB    Staphylococcus aureus (BCID) NOT DETECTED NOT DETECTED Final   Staphylococcus epidermidis DETECTED (A) NOT DETECTED Final    Comment: Methicillin (oxacillin) resistant coagulase negative staphylococcus. Possible blood culture contaminant (unless isolated from more than one blood culture draw or clinical case suggests pathogenicity). No antibiotic treatment is indicated for blood  culture contaminants. CRITICAL RESULT CALLED TO, READ BACK BY AND VERIFIED WITH: PHARMD E JACKSON 11/12/21 '@107'$  BY AB    Staphylococcus lugdunensis NOT DETECTED NOT DETECTED Final   Streptococcus species NOT DETECTED NOT DETECTED Final   Streptococcus agalactiae NOT DETECTED NOT DETECTED Final   Streptococcus pneumoniae  NOT DETECTED NOT DETECTED Final   Streptococcus pyogenes NOT DETECTED NOT DETECTED Final   A.calcoaceticus-baumannii NOT DETECTED NOT DETECTED Final   Bacteroides fragilis NOT DETECTED NOT DETECTED Final   Enterobacterales NOT DETECTED NOT DETECTED Final   Enterobacter cloacae complex NOT DETECTED NOT DETECTED Final   Escherichia coli NOT DETECTED NOT DETECTED Final   Klebsiella aerogenes NOT DETECTED NOT DETECTED Final   Klebsiella oxytoca NOT DETECTED NOT DETECTED Final    Klebsiella pneumoniae NOT DETECTED NOT DETECTED Final   Proteus species NOT DETECTED NOT DETECTED Final   Salmonella species NOT DETECTED NOT DETECTED Final   Serratia marcescens NOT DETECTED NOT DETECTED Final   Haemophilus influenzae NOT DETECTED NOT DETECTED Final   Neisseria meningitidis NOT DETECTED NOT DETECTED Final   Pseudomonas aeruginosa NOT DETECTED NOT DETECTED Final   Stenotrophomonas maltophilia NOT DETECTED NOT DETECTED Final   Candida albicans NOT DETECTED NOT DETECTED Final   Candida auris NOT DETECTED NOT DETECTED Final   Candida glabrata NOT DETECTED NOT DETECTED Final   Candida krusei NOT DETECTED NOT DETECTED Final   Candida parapsilosis NOT DETECTED NOT DETECTED Final   Candida tropicalis NOT DETECTED NOT DETECTED Final   Cryptococcus neoformans/gattii NOT DETECTED NOT DETECTED Final   Methicillin resistance mecA/C DETECTED (A) NOT DETECTED Final    Comment: CRITICAL RESULT CALLED TO, READ BACK BY AND VERIFIED WITH: PHARMD E JACKSON 11/12/21 '@107'$  BY AB Performed at [facility], [address] [place], [place] [address]   Urine Culture     Status: Abnormal   Collection Time: 11/10/21 12:17 PM   Specimen: Urine, Clean Catch  Result Value Ref Range Status   Specimen Description   Final    URINE, CLEAN CATCH Performed at [facility], [place] [address], [place], [place] [address]    Special Requests   Final    NONE Performed at [facility], [place] [address], [place], [place] [address]    Culture 30,000 COLONIES/mL ENTEROCOCCUS FAECALIS (A)  Final   Report Status 11/12/2021 FINAL  Final   Organism ID, Bacteria ENTEROCOCCUS FAECALIS (A)  Final      Susceptibility   Enterococcus faecalis - MIC*    AMPICILLIN <=2 SENSITIVE Sensitive     NITROFURANTOIN <=16 SENSITIVE Sensitive     VANCOMYCIN 1 SENSITIVE Sensitive     * 30,000 COLONIES/mL ENTEROCOCCUS FAECALIS  Respiratory (~20 pathogens) panel by PCR     Status: None    Collection Time: 11/14/21 10:21 AM   Specimen: Nasopharyngeal Swab; Respiratory  Result Value Ref Range Status   Adenovirus NOT DETECTED NOT DETECTED Final   Coronavirus 229E NOT DETECTED NOT DETECTED Final    Comment: (NOTE) The Coronavirus on the Respiratory Panel, DOES NOT test for the novel  Coronavirus (2019 nCoV)    Coronavirus HKU1 NOT DETECTED NOT DETECTED Final   Coronavirus NL63 NOT DETECTED NOT DETECTED Final   Coronavirus OC43 NOT DETECTED NOT DETECTED Final   Metapneumovirus NOT DETECTED NOT DETECTED Final   Rhinovirus / Enterovirus NOT DETECTED NOT DETECTED Final   Influenza A NOT DETECTED NOT DETECTED Final   Influenza B NOT DETECTED NOT DETECTED Final   Parainfluenza Virus 1 NOT DETECTED NOT DETECTED Final   Parainfluenza Virus 2 NOT DETECTED NOT DETECTED Final   Parainfluenza Virus 3 NOT DETECTED NOT DETECTED Final   Parainfluenza Virus 4 NOT DETECTED NOT DETECTED Final   Respiratory Syncytial Virus NOT DETECTED NOT DETECTED Final   Bordetella pertussis NOT DETECTED  NOT DETECTED Final   Bordetella Parapertussis NOT DETECTED NOT DETECTED Final   Chlamydophila pneumoniae NOT DETECTED NOT DETECTED Final   Mycoplasma pneumoniae NOT DETECTED NOT DETECTED Final    Comment: Performed at [facility], [place] [address], [place], [place] [address]  Culture, blood (Routine X 2) w Reflex to ID Panel     Status: None (Preliminary result)   Collection Time: 11/14/21 11:19 AM   Specimen: BLOOD  Result Value Ref Range Status   Specimen Description   Final    BLOOD RIGHT ANTECUBITAL Performed at [facility] [address] [place], [place] [address]    Special Requests   Final    BOTTLES DRAWN AEROBIC ONLY Blood Culture adequate volume Performed at [facility] [address], [place], [place] [address]    Culture   Final    NO GROWTH 3 DAYS Performed at [facility], [place] [address] [place], [place] [address]    Report  Status PENDING  Incomplete  Culture, blood (Routine X 2) w Reflex to ID Panel     Status: None (Preliminary result)   Collection Time: 11/14/21 11:19 AM   Specimen: BLOOD  Result Value Ref Range Status   Specimen Description   Final    BLOOD BLOOD RIGHT HAND Performed at [facility] [address], [place], [place] [address]    Special Requests   Final    BOTTLES DRAWN AEROBIC ONLY Blood Culture adequate volume Performed at [facility] [address] [place], [place] [address]    Culture   Final    NO GROWTH 3 DAYS Performed at [facility], [place] [address] [place],  [address]    Report Status PENDING  Incomplete     Time coordinating discharge:  35 minutes  SIGNED:   Barb Merino, MD  [facility] 11/17/2021, 9:21 AM

## 2021-11-17 NOTE — Final Progress Note (Signed)
Pt discharged home, AVS reviewed with pt and daughter, no questions or concerns at time of depature.  IV removed without complication.  Sent home with all belongings, transported by daughter via private vehicle.

## 2021-11-17 NOTE — TOC Transition Note (Signed)
Transition of Care [facility]) - CM/SW Discharge Note   Patient Details  Name: [name] MRN: [id number] Date of Birth: [birth year]  [facility] CM/SW Contact:  Ross Ludwig, LCSW Phone Number: 11/17/2021, 12:01 PM   Clinical Narrative:     Patient will be going home with home health through [facility].  TOC signing off please reconsult with any other TOC needs, home health agency has been notified of planned discharge.    Final next level of care: [place] Barriers to Discharge: Barriers Resolved   Patient Goals and CMS Choice Patient states their goals for this hospitalization and ongoing recovery are:: To return back home with home health. CMS Medicare.gov Compare Post Acute Care list provided to:: Patient Choice offered to / list presented to : Patient  Discharge Placement                       Discharge Plan and Services In-house Referral: NA Discharge Planning Services: NA Post Acute Care Choice: Home Health          DME Arranged: N/A DME Agency: NA       HH Arranged: PT, OT [place] Agency: Other - See comment ([place]) Date [place]: 11/17/21 Time [place]: 61 Representative spoke with at [place]: Left a message on voice mail  Social Determinants of Health (SDOH) Interventions     Readmission Risk Interventions    11/02/2021   12:11 PM  Readmission Risk Prevention Plan  Transportation Screening Complete  PCP or Specialist Appt within 5-7 Days Complete  Home Care Screening Complete  Medication Review (RN CM) Complete

## 2021-11-17 NOTE — Progress Notes (Signed)
PT refused cpap for the night. 

## 2021-11-17 NOTE — Progress Notes (Signed)
Occupational Therapy Treatment Patient Details Name: [name] MRN: [id number] DOB: April 09, 1931 Today's Date: 11/17/2021   History of present illness [name] is a 88 y.o. female with medical history significant of SVT, tachycardia-bradycardia syndrome, paroxysmal atrial fibrillation, history of atrial flutter, history of ablation, UTI's, CVA,OSA, right Achilles tendinitis, hyperlipidemia, emphysema, hypertensive , sciatica,Recently hospitalized for COVID-19 infection and discharged home on prednisone taper.  comes back 11/09/21 with generalized fatigue with worsening mobility and poor p.o. intake. She was found to have acute metabolic encephalopathy   OT comments  Patient was motivated to participate in the session & she stated she will be returning home today. Her daughter was present during most of the session.  She required min guard assist to stand, min assist for toileting at bathroom level, and min guard assist for grooming in standing at sink level. She reported having back pain after prolonged standing, requiring a seated rest break. She was also educated on implementing other energy conservation strategies as needed during activity. Her O2 saturation was 92% on room with activity. Continue OT plan of care to maximize her independence with self-care tasks.    Recommendations for follow up therapy are one component of a multi-disciplinary discharge planning process, led by the attending physician.  Recommendations may be updated based on patient status, additional functional criteria and insurance authorization.    Follow Up Recommendations  Home health OT    Assistance Recommended at Discharge Intermittent Supervision/Assistance  Patient can return home with the following  Assistance with cooking/housework;Direct supervision/assist for financial management;Direct supervision/assist for medications management;Help with stairs or ramp for entrance;A little help with  bathing/dressing/bathroom;A little help with walking and/or transfers   Equipment Recommendations  None recommended by OT       Precautions / Restrictions Precautions Precautions: Fall Restrictions Weight Bearing Restrictions: No Other Position/Activity Restrictions: contact/airborne       Mobility Bed Mobility Overal bed mobility: Needs Assistance Bed Mobility: Sit to Supine Rolling: Min guard              Transfers Overall transfer level: Needs assistance Equipment used: Rolling walker (2 wheels) Transfers: Sit to/from Stand Sit to Stand: Min guard                     ADL either performed or assessed with clinical judgement   ADL Overall ADL's : Needs assistance/impaired Eating/Feeding: Independent   Grooming: Set up;Min guard Grooming Details (indicate cue type and reason): She performed hand washing, face washing, and teeth brushing standing at the sink.         Upper Body Dressing : Supervision/safety;Set up;Sitting Upper Body Dressing Details (indicate cue type and reason): She donned a hospital gown around her back seated at chair level.     Toilet Transfer: Rolling walker (2 wheels);Cueing for safety;Min guard Toilet Transfer Details (indicate cue type and reason): She required min verbal cues for general transfer technique, including RW placement and use of grab bar as needed. Toileting- Clothing Manipulation and Hygiene: Minimal assistance;Sit to/from stand Toileting - Clothing Manipulation Details (indicate cue type and reason): She performed toileting at bathroom level, requiring steadying assist in standing, min assist for clothing management, min cues for toilet transfer technique, and min guard asssist as she performed standing hygiene.                       Cognition Arousal/Alertness: Awake/alert Behavior During Therapy: WFL for tasks assessed/performed  Pertinent Vitals/ Pain       Pain  Assessment Pain Assessment: Faces Pain Score: 4  Pain Location: back Pain Intervention(s): Repositioned         Frequency  Min 2X/week        Progress Toward Goals  OT Goals(current goals can now be found in the care plan section)  Progress towards OT goals: Progressing toward goals  Acute Rehab OT Goals Patient Stated Goal: To return home today OT Goal Formulation: With patient Time For Goal Achievement: 11/25/21 Potential to Achieve Goals: Good  Plan Discharge plan remains appropriate       AM-PAC OT "6 Clicks" Daily Activity     Outcome Measure   Help from another person eating meals?: None Help from another person taking care of personal grooming?: None Help from another person toileting, which includes using toliet, bedpan, or urinal?: A Little Help from another person bathing (including washing, rinsing, drying)?: A Little Help from another person to put on and taking off regular upper body clothing?: None Help from another person to put on and taking off regular lower body clothing?: A Little 6 Click Score: 21    End of Session Equipment Utilized During Treatment: Gait belt;Rolling walker (2 wheels)  OT Visit Diagnosis: Unsteadiness on feet (R26.81);Muscle weakness (generalized) (M62.81)   Activity Tolerance Patient tolerated treatment well   Patient Left in bed;with call bell/phone within reach;with family/visitor present   Nurse Communication Mobility status        Time: 0761-5183 OT Time Calculation (min): 32 min  Charges: OT General Charges $OT Visit: 1 Visit OT Treatments $Self Care/Home Management : 8-22 mins $Therapeutic Activity: 8-22 mins     Leota Sauers, OTR/L 11/17/2021, 10:22 AM

## 2021-11-18 DIAGNOSIS — C44321 Squamous cell carcinoma of skin of nose: Secondary | ICD-10-CM | POA: Diagnosis not present

## 2021-11-18 DIAGNOSIS — Z85828 Personal history of other malignant neoplasm of skin: Secondary | ICD-10-CM | POA: Diagnosis not present

## 2021-11-19 ENCOUNTER — Emergency Department (HOSPITAL_COMMUNITY): Payer: Medicare Other

## 2021-11-19 ENCOUNTER — Other Ambulatory Visit: Payer: Self-pay

## 2021-11-19 ENCOUNTER — Inpatient Hospital Stay (HOSPITAL_COMMUNITY)
Admission: EM | Admit: 2021-11-19 | Discharge: 2021-11-28 | DRG: 689 | Disposition: A | Discharge status: Skilled Nursing Facility | Payer: Medicare Other | Attending: Internal Medicine | Admitting: Internal Medicine

## 2021-11-19 DIAGNOSIS — I495 Sick sinus syndrome: Secondary | ICD-10-CM | POA: Diagnosis present

## 2021-11-19 DIAGNOSIS — I471 Supraventricular tachycardia, unspecified: Secondary | ICD-10-CM | POA: Diagnosis not present

## 2021-11-19 DIAGNOSIS — R1310 Dysphagia, unspecified: Secondary | ICD-10-CM | POA: Diagnosis not present

## 2021-11-19 DIAGNOSIS — R627 Adult failure to thrive: Secondary | ICD-10-CM | POA: Diagnosis not present

## 2021-11-19 DIAGNOSIS — H5123 Internuclear ophthalmoplegia, bilateral: Secondary | ICD-10-CM | POA: Diagnosis not present

## 2021-11-19 DIAGNOSIS — M5136 Other intervertebral disc degeneration, lumbar region: Secondary | ICD-10-CM | POA: Diagnosis not present

## 2021-11-19 DIAGNOSIS — Z87891 Personal history of nicotine dependence: Secondary | ICD-10-CM | POA: Diagnosis not present

## 2021-11-19 DIAGNOSIS — Z8249 Family history of ischemic heart disease and other diseases of the circulatory system: Secondary | ICD-10-CM | POA: Diagnosis not present

## 2021-11-19 DIAGNOSIS — I129 Hypertensive chronic kidney disease with stage 1 through stage 4 chronic kidney disease, or unspecified chronic kidney disease: Secondary | ICD-10-CM | POA: Diagnosis not present

## 2021-11-19 DIAGNOSIS — J310 Chronic rhinitis: Secondary | ICD-10-CM | POA: Diagnosis not present

## 2021-11-19 DIAGNOSIS — M6281 Muscle weakness (generalized): Secondary | ICD-10-CM | POA: Diagnosis not present

## 2021-11-19 DIAGNOSIS — H6122 Impacted cerumen, left ear: Secondary | ICD-10-CM | POA: Diagnosis present

## 2021-11-19 DIAGNOSIS — N3001 Acute cystitis with hematuria: Secondary | ICD-10-CM

## 2021-11-19 DIAGNOSIS — F411 Generalized anxiety disorder: Secondary | ICD-10-CM | POA: Diagnosis present

## 2021-11-19 DIAGNOSIS — R918 Other nonspecific abnormal finding of lung field: Secondary | ICD-10-CM | POA: Diagnosis not present

## 2021-11-19 DIAGNOSIS — E785 Hyperlipidemia, unspecified: Secondary | ICD-10-CM | POA: Diagnosis present

## 2021-11-19 DIAGNOSIS — M1990 Unspecified osteoarthritis, unspecified site: Secondary | ICD-10-CM | POA: Diagnosis not present

## 2021-11-19 DIAGNOSIS — N1831 Chronic kidney disease, stage 3a: Secondary | ICD-10-CM | POA: Diagnosis not present

## 2021-11-19 DIAGNOSIS — R509 Fever, unspecified: Secondary | ICD-10-CM | POA: Diagnosis not present

## 2021-11-19 DIAGNOSIS — D7589 Other specified diseases of blood and blood-forming organs: Secondary | ICD-10-CM | POA: Diagnosis not present

## 2021-11-19 DIAGNOSIS — Z882 Allergy status to sulfonamides status: Secondary | ICD-10-CM | POA: Diagnosis not present

## 2021-11-19 DIAGNOSIS — H60502 Unspecified acute noninfective otitis externa, left ear: Secondary | ICD-10-CM | POA: Diagnosis not present

## 2021-11-19 DIAGNOSIS — Z7901 Long term (current) use of anticoagulants: Secondary | ICD-10-CM

## 2021-11-19 DIAGNOSIS — R29810 Facial weakness: Secondary | ICD-10-CM | POA: Diagnosis not present

## 2021-11-19 DIAGNOSIS — Z79899 Other long term (current) drug therapy: Secondary | ICD-10-CM | POA: Diagnosis not present

## 2021-11-19 DIAGNOSIS — I480 Paroxysmal atrial fibrillation: Secondary | ICD-10-CM | POA: Diagnosis not present

## 2021-11-19 DIAGNOSIS — M5431 Sciatica, right side: Secondary | ICD-10-CM | POA: Diagnosis not present

## 2021-11-19 DIAGNOSIS — G4733 Obstructive sleep apnea (adult) (pediatric): Secondary | ICD-10-CM | POA: Diagnosis not present

## 2021-11-19 DIAGNOSIS — Z7401 Bed confinement status: Secondary | ICD-10-CM | POA: Diagnosis not present

## 2021-11-19 DIAGNOSIS — N3000 Acute cystitis without hematuria: Secondary | ICD-10-CM | POA: Diagnosis not present

## 2021-11-19 DIAGNOSIS — Z85828 Personal history of other malignant neoplasm of skin: Secondary | ICD-10-CM | POA: Diagnosis not present

## 2021-11-19 DIAGNOSIS — R000 Tachycardia, unspecified: Secondary | ICD-10-CM | POA: Diagnosis not present

## 2021-11-19 DIAGNOSIS — R0902 Hypoxemia: Secondary | ICD-10-CM | POA: Diagnosis not present

## 2021-11-19 DIAGNOSIS — R2681 Unsteadiness on feet: Secondary | ICD-10-CM | POA: Diagnosis not present

## 2021-11-19 DIAGNOSIS — N390 Urinary tract infection, site not specified: Secondary | ICD-10-CM | POA: Diagnosis present

## 2021-11-19 DIAGNOSIS — I10 Essential (primary) hypertension: Secondary | ICD-10-CM | POA: Diagnosis present

## 2021-11-19 DIAGNOSIS — I4892 Unspecified atrial flutter: Secondary | ICD-10-CM | POA: Diagnosis not present

## 2021-11-19 DIAGNOSIS — Z888 Allergy status to other drugs, medicaments and biological substances status: Secondary | ICD-10-CM

## 2021-11-19 DIAGNOSIS — D692 Other nonthrombocytopenic purpura: Secondary | ICD-10-CM | POA: Diagnosis not present

## 2021-11-19 DIAGNOSIS — D6869 Other thrombophilia: Secondary | ICD-10-CM | POA: Diagnosis not present

## 2021-11-19 DIAGNOSIS — R1312 Dysphagia, oropharyngeal phase: Secondary | ICD-10-CM | POA: Diagnosis not present

## 2021-11-19 DIAGNOSIS — R531 Weakness: Secondary | ICD-10-CM | POA: Diagnosis not present

## 2021-11-19 DIAGNOSIS — I700 Atherosclerosis of aorta: Secondary | ICD-10-CM | POA: Diagnosis not present

## 2021-11-19 DIAGNOSIS — J432 Centrilobular emphysema: Secondary | ICD-10-CM | POA: Diagnosis not present

## 2021-11-19 DIAGNOSIS — R41841 Cognitive communication deficit: Secondary | ICD-10-CM | POA: Diagnosis not present

## 2021-11-19 DIAGNOSIS — R739 Hyperglycemia, unspecified: Secondary | ICD-10-CM

## 2021-11-19 DIAGNOSIS — I69320 Aphasia following cerebral infarction: Secondary | ICD-10-CM | POA: Diagnosis not present

## 2021-11-19 DIAGNOSIS — Z8673 Personal history of transient ischemic attack (TIA), and cerebral infarction without residual deficits: Secondary | ICD-10-CM | POA: Diagnosis not present

## 2021-11-19 DIAGNOSIS — E872 Acidosis, unspecified: Secondary | ICD-10-CM | POA: Diagnosis present

## 2021-11-19 DIAGNOSIS — F0390 Unspecified dementia without behavioral disturbance: Secondary | ICD-10-CM | POA: Diagnosis present

## 2021-11-19 DIAGNOSIS — Z91030 Bee allergy status: Secondary | ICD-10-CM

## 2021-11-19 DIAGNOSIS — R4781 Slurred speech: Secondary | ICD-10-CM | POA: Diagnosis not present

## 2021-11-19 DIAGNOSIS — H3411 Central retinal artery occlusion, right eye: Secondary | ICD-10-CM | POA: Diagnosis not present

## 2021-11-19 DIAGNOSIS — R4182 Altered mental status, unspecified: Secondary | ICD-10-CM | POA: Diagnosis not present

## 2021-11-19 DIAGNOSIS — I69398 Other sequelae of cerebral infarction: Secondary | ICD-10-CM | POA: Diagnosis not present

## 2021-11-19 DIAGNOSIS — N10 Acute tubulo-interstitial nephritis: Secondary | ICD-10-CM | POA: Diagnosis not present

## 2021-11-19 DIAGNOSIS — I69828 Other speech and language deficits following other cerebrovascular disease: Secondary | ICD-10-CM | POA: Diagnosis not present

## 2021-11-19 DIAGNOSIS — E871 Hypo-osmolality and hyponatremia: Secondary | ICD-10-CM | POA: Diagnosis not present

## 2021-11-19 DIAGNOSIS — R262 Difficulty in walking, not elsewhere classified: Secondary | ICD-10-CM | POA: Diagnosis not present

## 2021-11-19 DIAGNOSIS — R32 Unspecified urinary incontinence: Secondary | ICD-10-CM | POA: Diagnosis present

## 2021-11-19 DIAGNOSIS — J439 Emphysema, unspecified: Secondary | ICD-10-CM | POA: Diagnosis present

## 2021-11-19 DIAGNOSIS — Z8616 Personal history of COVID-19: Secondary | ICD-10-CM | POA: Diagnosis not present

## 2021-11-19 DIAGNOSIS — Z885 Allergy status to narcotic agent status: Secondary | ICD-10-CM

## 2021-11-19 DIAGNOSIS — U071 COVID-19: Secondary | ICD-10-CM | POA: Diagnosis not present

## 2021-11-19 DIAGNOSIS — R4189 Other symptoms and signs involving cognitive functions and awareness: Secondary | ICD-10-CM | POA: Diagnosis not present

## 2021-11-19 DIAGNOSIS — B9689 Other specified bacterial agents as the cause of diseases classified elsewhere: Secondary | ICD-10-CM | POA: Diagnosis not present

## 2021-11-19 DIAGNOSIS — J9601 Acute respiratory failure with hypoxia: Secondary | ICD-10-CM | POA: Diagnosis not present

## 2021-11-19 DIAGNOSIS — E860 Dehydration: Secondary | ICD-10-CM | POA: Diagnosis not present

## 2021-11-19 DIAGNOSIS — Z800 Family history of malignant neoplasm of digestive organs: Secondary | ICD-10-CM

## 2021-11-19 DIAGNOSIS — M5116 Intervertebral disc disorders with radiculopathy, lumbar region: Secondary | ICD-10-CM | POA: Diagnosis not present

## 2021-11-19 DIAGNOSIS — R7301 Impaired fasting glucose: Secondary | ICD-10-CM | POA: Diagnosis not present

## 2021-11-19 DIAGNOSIS — I729 Aneurysm of unspecified site: Secondary | ICD-10-CM | POA: Diagnosis not present

## 2021-11-19 DIAGNOSIS — D75829 Heparin-induced thrombocytopenia, unspecified: Secondary | ICD-10-CM | POA: Diagnosis not present

## 2021-11-19 LAB — COMPREHENSIVE METABOLIC PANEL
ALT: 118 U/L — ABNORMAL HIGH (ref 0–44)
AST: 33 U/L (ref 15–41)
Albumin: 2.9 g/dL — ABNORMAL LOW (ref 3.5–5.0)
Alkaline Phosphatase: 55 U/L (ref 38–126)
Anion gap: 9 (ref 5–15)
BUN: 16 mg/dL (ref 8–23)
CO2: 25 mmol/L (ref 22–32)
Calcium: 8.1 mg/dL — ABNORMAL LOW (ref 8.9–10.3)
Chloride: 102 mmol/L (ref 98–111)
Creatinine, Ser: 0.84 mg/dL (ref 0.44–1.00)
GFR, Estimated: >60 mL/min (ref >60)
Glucose, Bld: 146 mg/dL — ABNORMAL HIGH (ref 70–99)
Potassium: 3.9 mmol/L (ref 3.5–5.1)
Sodium: 136 mmol/L (ref 135–145)
Total Bilirubin: 1.1 mg/dL (ref 0.3–1.2)
Total Protein: 6.1 g/dL — ABNORMAL LOW (ref 6.5–8.1)

## 2021-11-19 LAB — URINALYSIS, ROUTINE W REFLEX MICROSCOPIC
Bilirubin Urine: NEGATIVE
Glucose, UA: NEGATIVE mg/dL
Ketones, ur: NEGATIVE mg/dL
Nitrite: NEGATIVE
Protein, ur: NEGATIVE mg/dL
Specific Gravity, Urine: 1.003 — ABNORMAL LOW (ref 1.005–1.030)
pH: 8 (ref 5.0–8.0)

## 2021-11-19 LAB — CULTURE, BLOOD (ROUTINE X 2)
Culture: NO GROWTH
Culture: NO GROWTH
Special Requests: ADEQUATE
Special Requests: ADEQUATE

## 2021-11-19 LAB — CBC WITH DIFFERENTIAL/PLATELET
Abs Immature Granulocytes: 1.14 10*3/uL — ABNORMAL HIGH (ref 0.00–0.07)
Basophils Absolute: 0 10*3/uL (ref 0.0–0.1)
Basophils Relative: 0 %
Eosinophils Absolute: 0.3 10*3/uL (ref 0.0–0.5)
Eosinophils Relative: 3 %
HCT: 37.2 % (ref 36.0–46.0)
Hemoglobin: 12.5 g/dL (ref 12.0–15.0)
Immature Granulocytes: 10 %
Lymphocytes Relative: 10 %
Lymphs Abs: 1.1 10*3/uL (ref 0.7–4.0)
MCH: 33.4 pg (ref 26.0–34.0)
MCHC: 33.6 g/dL (ref 30.0–36.0)
MCV: 99.5 fL (ref 80.0–100.0)
Monocytes Absolute: 0.5 10*3/uL (ref 0.1–1.0)
Monocytes Relative: 5 %
Neutro Abs: 8.1 10*3/uL — ABNORMAL HIGH (ref 1.7–7.7)
Neutrophils Relative %: 72 %
Platelets: 350 10*3/uL (ref 150–400)
RBC: 3.74 MIL/uL — ABNORMAL LOW (ref 3.87–5.11)
RDW: 12.9 % (ref 11.5–15.5)
WBC: 11.2 10*3/uL — ABNORMAL HIGH (ref 4.0–10.5)
nRBC: 0.9 % — ABNORMAL HIGH (ref 0.0–0.2)

## 2021-11-19 LAB — CBG MONITORING, ED: Glucose-Capillary: 146 mg/dL — ABNORMAL HIGH (ref 70–99)

## 2021-11-19 LAB — LACTIC ACID, PLASMA: Lactic Acid, Venous: 2.6 mmol/L (ref 0.5–1.9)

## 2021-11-19 MED ORDER — ENOXAPARIN SODIUM 40 MG/0.4ML IJ SOSY
40.0000 mg | PREFILLED_SYRINGE | INTRAMUSCULAR | Status: DC
  Administered 2021-11-19: 40 mg via SUBCUTANEOUS
  Filled 2021-11-19: qty 0.4

## 2021-11-19 MED ORDER — ONDANSETRON HCL 4 MG/2ML IJ SOLN: 4.0000 mg | Freq: Four times a day (QID) | INTRAMUSCULAR | Status: DC | PRN

## 2021-11-19 MED ORDER — POLYETHYLENE GLYCOL 3350 17 G PO PACK: 17.0000 g | PACK | Freq: Two times a day (BID) | ORAL | Status: DC | PRN

## 2021-11-19 MED ORDER — VITAMIN D 25 MCG (1000 UNIT) PO TABS
2000.0000 [IU] | ORAL_TABLET | Freq: Every day | ORAL | Status: DC
  Administered 2021-11-19 – 2021-11-27 (×9): 2000 [IU] via ORAL
  Filled 2021-11-19 (×10): qty 2

## 2021-11-19 MED ORDER — ACETAMINOPHEN 650 MG RE SUPP: 650.0000 mg | Freq: Four times a day (QID) | RECTAL | Status: DC | PRN

## 2021-11-19 MED ORDER — AMLODIPINE BESYLATE 5 MG PO TABS: 2.5000 mg | ORAL_TABLET | Freq: Every day | ORAL | Status: DC

## 2021-11-19 MED ORDER — MIRABEGRON ER 25 MG PO TB24
25.0000 mg | ORAL_TABLET | Freq: Every day | ORAL | Status: DC
  Administered 2021-11-19 – 2021-11-27 (×9): 25 mg via ORAL
  Filled 2021-11-19 (×10): qty 1

## 2021-11-19 MED ORDER — ADULT MULTIVITAMIN W/MINERALS CH
1.0000 | ORAL_TABLET | Freq: Every day | ORAL | Status: DC
  Administered 2021-11-19 – 2021-11-27 (×8): 1 via ORAL
  Filled 2021-11-19 (×9): qty 1

## 2021-11-19 MED ORDER — OXYCODONE HCL 5 MG PO TABS: 5.0000 mg | ORAL_TABLET | Freq: Four times a day (QID) | ORAL | Status: DC | PRN

## 2021-11-19 MED ORDER — LACTATED RINGERS IV SOLN: INTRAVENOUS | Status: DC

## 2021-11-19 MED ORDER — IPRATROPIUM-ALBUTEROL 0.5-2.5 (3) MG/3ML IN SOLN: 3.0000 mL | Freq: Four times a day (QID) | RESPIRATORY_TRACT | Status: DC | PRN

## 2021-11-19 MED ORDER — VANCOMYCIN HCL IN DEXTROSE 1-5 GM/200ML-% IV SOLN: 1000.0000 mg | Freq: Once | INTRAVENOUS | Status: DC

## 2021-11-19 MED ORDER — ACETAMINOPHEN 325 MG PO TABS: 650.0000 mg | ORAL_TABLET | Freq: Four times a day (QID) | ORAL | Status: DC | PRN

## 2021-11-19 MED ORDER — SENNOSIDES-DOCUSATE SODIUM 8.6-50 MG PO TABS: 1.0000 | ORAL_TABLET | Freq: Two times a day (BID) | ORAL | Status: DC | PRN

## 2021-11-19 MED ORDER — APIXABAN 5 MG PO TABS
5.0000 mg | ORAL_TABLET | Freq: Two times a day (BID) | ORAL | Status: DC
  Administered 2021-11-20 – 2021-11-27 (×16): 5 mg via ORAL
  Filled 2021-11-19 (×16): qty 1

## 2021-11-19 MED ORDER — PIPERACILLIN-TAZOBACTAM 3.375 G IVPB
3.3750 g | Freq: Two times a day (BID) | INTRAVENOUS | Status: DC
  Administered 2021-11-19 – 2021-11-20 (×3): 3.375 g via INTRAVENOUS
  Filled 2021-11-19 (×3): qty 50

## 2021-11-19 MED ORDER — IPRATROPIUM-ALBUTEROL 20-100 MCG/ACT IN AERS: 1.0000 | INHALATION_SPRAY | Freq: Four times a day (QID) | RESPIRATORY_TRACT | Status: DC | PRN

## 2021-11-19 MED ORDER — ONDANSETRON HCL 4 MG PO TABS: 4.0000 mg | ORAL_TABLET | Freq: Four times a day (QID) | ORAL | Status: DC | PRN

## 2021-11-19 MED ORDER — SODIUM CHLORIDE 0.9 % IV BOLUS
1000.0000 mL | Freq: Once | INTRAVENOUS | Status: AC
  Administered 2021-11-19: 1000 mL via INTRAVENOUS

## 2021-11-19 NOTE — ED Notes (Signed)
Floor unable to take report at this time since pt has cardiac monitoring orders, floor coverage paged to have orders changed.

## 2021-11-19 NOTE — ED Provider Notes (Signed)
Care assumed from Dr. Lorretta Harp.  Patient discharged from the hospital 2 days ago after weeklong hospitalization for UTI and COVID.  She is been generally weak for the past 2 days, not able to get better care for herself.  Fever for the past 1 day with very little appetite.  Vitals are stable, no distress or increased work of breathing.  Was placed on nasal cannula oxygen for some borderline hypoxia.  Denies any cough or shortness of breath.  Work shows mildly elevated lactic acid.  Urinalysis again concerning for infection and culture is sent.  Was treated for Enterococcus infection while she was hospitalized but completed antibiotics prior to discharge  Patient is not able to ambulate, generalized weakness, recurrent UTI symptoms we will plan admission for IV antibiotics and hydration.  Infectious disease thought her to have asymptomatic bacteriuria while she was hospitalized but now she has UTI symptoms.  Vancomycin initiated given Enterococcus culture results. Not able to ambulate.  Admit discussed with Dr. Cyndia Skeeters.   Ezequiel Essex, MD 11/19/21 1751

## 2021-11-19 NOTE — ED Triage Notes (Signed)
Patient BIBEMS states she has had increase weakness. Patient D/C Tuesday from hospital for UTI and given ABX which she is currently on and has surgery on nose yesterday to have cancer removed.

## 2021-11-19 NOTE — ED Provider Notes (Signed)
[facility] DEPT Provider Note   CSN: [id number] Arrival date & time: 11/19/21  1325     History  Chief Complaint  Patient presents with   Weakness    Recent DX of UTI states she believes she may still have UTI despite currently being on ABX     OLEAN SANGSTER is a 88 y.o. female.  Pt is an 88 yo female with pmhx significant for afib, (on Eliquis), htn, osa, hld, cva, chronic utis, and recent covid.  Pt was admitted from 10/17-24 for uti with a culture which grew out enterococcus treated with rocephin then augmentin.  Pt said she has been extremely weak since going home.  She is unable to do anything for herself.  She has had very little appetite.  She feels weak all over.  She did have a skin cancer removed from her nose yesterday.  Her daughter has been caring for her, but she used to live alone.       Home Medications Prior to Admission medications   Medication Sig Start Date End Date Taking? Authorizing Provider  amLODipine (NORVASC) 2.5 MG tablet Take 1 tablet (2.5 mg total) by mouth daily. 02/25/21 02/25/22  Richardson Dopp T, PA-C  apixaban (ELIQUIS) 5 MG TABS tablet Take 1 tablet (5 mg total) by mouth 2 (two) times daily. 07/20/21 07/20/22  Jerline Pain, MD  ascorbic acid (VITAMIN C) 500 MG tablet Take 1 tablet (500 mg total) by mouth daily. 11/03/21   Sheikh, Georgina Quint Latif, DO  atorvastatin (LIPITOR) 10 MG tablet Take 1 tablet (10 mg total) by mouth at bedtime. Hold for a week until repeat LFTs are obtained and imprvoing Patient not taking: Reported on 11/10/2021 11/09/21 11/09/22  Raiford Noble Latif, DO  cephALEXin (KEFLEX) 250 MG capsule Take 250 mg by mouth daily. 01/31/20   [provider]  Cholecalciferol (VITAMIN D3) 50 MCG (2000 UT) CAPS Take 2,000 Units by mouth at bedtime.    [provider]  CRANBERRY PO Take 650 mg by mouth daily.    [provider]  Cyanocobalamin (VITAMIN B12 PO) Take 1 tablet by mouth at  bedtime.    [provider]  FIBER ADULT GUMMIES PO Take 2 tablets by mouth daily.    [provider]  GEMTESA 75 MG TABS Take 75 mg by mouth daily.    [provider]  guaiFENesin-dextromethorphan (ROBITUSSIN DM) 100-10 MG/5ML syrup Take 10 mLs by mouth every 4 (four) hours as needed for cough. 11/02/21   Sheikh, Omair Latif, DO  Ipratropium-Albuterol (COMBIVENT) 20-100 MCG/ACT AERS respimat Inhale 1 puff into the lungs every 6 (six) hours as needed for wheezing or shortness of breath. 11/02/21   Sheikh, Georgina Quint Latif, DO  Multiple Vitamins-Minerals (SENIOR VITES PO) Take 1 tablet by mouth daily with breakfast.    [provider]  ondansetron (ZOFRAN) 4 MG tablet Take 1 tablet (4 mg total) by mouth every 6 (six) hours as needed for nausea. 11/02/21   Raiford Noble Latif, DO  oxyCODONE (OXY IR/ROXICODONE) 5 MG immediate release tablet Take 5 mg by mouth every 4 (four) hours as needed (for pain).    [provider]  Probiotic Product (Echo) CAPS Take 1 capsule by mouth daily.    [provider]  zinc sulfate 220 (50 Zn) MG capsule Take 1 capsule (220 mg total) by mouth daily. 11/03/21   Raiford Noble Latif, DO      Allergies    Bee venom,  Mixed vespid venom, Codeine, Sulfa antibiotics, and Erythromycin    Review of Systems   Review of Systems  Neurological:  Positive for weakness.  All other systems reviewed and are negative.   Physical Exam Updated Vital Signs BP (!) 162/89   Pulse (!) 55   Temp (!) 97.5 F (36.4 C) (Oral)   Resp 17   Ht 5' (1.524 m)   Wt 68 kg   LMP  (LMP Unknown)   SpO2 97%   BMI 29.29 kg/m  Physical Exam Vitals and nursing note reviewed.  Constitutional:      Appearance: Normal appearance.  HENT:     Head: Normocephalic and atraumatic.     Comments: Dressings to nose from surgery yesterday    Right Ear: External ear normal.     Left Ear: External ear normal.     Nose: Nose normal.      Mouth/Throat:     Mouth: Mucous membranes are dry.  Eyes:     Extraocular Movements: Extraocular movements intact.     Conjunctiva/sclera: Conjunctivae normal.     Pupils: Pupils are equal, round, and reactive to light.  Cardiovascular:     Rate and Rhythm: Normal rate and regular rhythm.     Pulses: Normal pulses.     Heart sounds: Normal heart sounds.  Pulmonary:     Effort: Pulmonary effort is normal.     Breath sounds: Normal breath sounds.  Abdominal:     General: Abdomen is flat. Bowel sounds are normal.     Palpations: Abdomen is soft.  Musculoskeletal:        General: Normal range of motion.     Cervical back: Normal range of motion and neck supple.  Skin:    General: Skin is warm.     Capillary Refill: Capillary refill takes less than 2 seconds.  Neurological:     General: No focal deficit present.     Mental Status: She is alert and oriented to person, place, and time.  Psychiatric:        Mood and Affect: Mood normal.        Behavior: Behavior normal.     ED Results / Procedures / Treatments   Labs (all labs ordered are listed, but only abnormal results are displayed) Labs Reviewed  CBC WITH DIFFERENTIAL/PLATELET - Abnormal; Notable for the following components:      Result Value   WBC 11.2 (*)    RBC 3.74 (*)    nRBC 0.9 (*)    Neutro Abs 8.1 (*)    Abs Immature Granulocytes 1.14 (*)    All other components within normal limits  COMPREHENSIVE METABOLIC PANEL - Abnormal; Notable for the following components:   Glucose, Bld 146 (*)    Calcium 8.1 (*)    Total Protein 6.1 (*)    Albumin 2.9 (*)    ALT 118 (*)    All other components within normal limits  LACTIC ACID, PLASMA - Abnormal; Notable for the following components:   Lactic Acid, Venous 2.6 (*)    All other components within normal limits  CBG MONITORING, ED - Abnormal; Notable for the following components:   Glucose-Capillary 146 (*)    All other components within normal limits  URINALYSIS,  ROUTINE W REFLEX MICROSCOPIC    EKG None  Radiology DG Chest Portable 1 View  Result Date: 11/19/2021 CLINICAL DATA:  Altered mental status EXAM: PORTABLE CHEST 1 VIEW COMPARISON:  Chest 11/14/2021 FINDINGS: The heart size and mediastinal  contours are within normal limits. Both lungs are clear. The visualized skeletal structures are unremarkable. Improvement in bilateral airspace disease which may have been Hartman edema previously. IMPRESSION: No active disease. Electronically Signed   By: Franchot Gallo M.D.   On: 11/19/2021 14:26    Procedures Procedures    Medications Ordered in ED Medications  sodium chloride 0.9 % bolus 1,000 mL (1,000 mLs Intravenous New Bag/Given 11/19/21 1406)    ED Course/ Medical Decision Making/ A&P                           Medical Decision Making Amount and/or Complexity of Data Reviewed Labs: ordered. Radiology: ordered.   This patient presents to the ED for concern of weakness, this involves an extensive number of treatment options, and is a complaint that carries with it a high risk of complications and morbidity.  The differential diagnosis includes infection, electrolyte abn   Co morbidities that complicate the patient evaluation   afib, (on Eliquis), htn, osa, hld, cva, chronic utis, and recent covid.   Additional history obtained:  Additional history obtained from epic chart review    Lab Tests:  I Ordered, and personally interpreted labs.  The pertinent results include:  cbc with hgb 11.2 with left shift; cmp with glucose 146, lactic elevated at 2.6;    Imaging Studies ordered:  I ordered imaging studies including cxr  I independently visualized and interpreted imaging which showed  IMPRESSION:  No active disease.   I agree with the radiologist interpretation   Cardiac Monitoring:  The patient was maintained on a cardiac monitor.  I personally viewed and interpreted the cardiac monitored which showed an underlying  rhythm of: nsr   Medicines ordered and prescription drug management:  I ordered medication including ivfs  for weakness  Reevaluation of the patient after these medicines showed that the patient improved I have reviewed the patients home medicines and have made adjustments as needed  Critical Interventions:  ivfs   Problem List / ED Course:  Weakness:  urine is pending.   Reevaluation:  After the interventions noted above, I reevaluated the patient and found that they have :improved   Social Determinants of Health:  Lives at home   Dispostion:  Pending urine        Final Clinical Impression(s) / ED Diagnoses Final diagnoses:  Weakness  Dehydration    Rx / DC Orders ED Discharge Orders     None         Isla Pence, MD 11/19/21 1524

## 2021-11-19 NOTE — ED Notes (Signed)
Pt. Was not able to ambulate at this time.

## 2021-11-19 NOTE — ED Notes (Signed)
Water given for po challenge.

## 2021-11-19 NOTE — Progress Notes (Signed)
Pharmacy Antibiotic Note  [name] is a 88 y.o. female admitted on 11/19/2021 with weakness, patient reports she believes she has UTI. Recently admitted 10/17-10/24 for Covid. Urine culture at that time 30k E.faecalis, asymptomatic bacteruria as noted by ID. Pharmacy has been consulted for Zosyn dosing.  Plan: Zosyn 3.375 g IV q12h extended infusion  Height: 5' (152.4 cm) Weight: 68 kg (150 lb) IBW/kg (Calculated) : 45.5  Temp (24hrs), Avg:97.8 F (36.6 C), Min:97.5 F (36.4 C), Max:98 F (36.7 C)  Recent Labs  Lab 11/13/21 0531 11/15/21 0542 11/16/21 0453 11/17/21 0510 11/19/21 1351  WBC 7.4 5.5 15.7* 17.7* 11.2*  CREATININE 0.76 0.80 0.76  --  0.84  LATICACIDVEN  --   --   --   --  2.6*    Estimated Creatinine Clearance: 39.8 mL/min (by C-G formula based on SCr of 0.84 mg/dL).    Allergies  Allergen Reactions   Bee Venom Anaphylaxis   Mixed Vespid Venom Anaphylaxis   Codeine Other (See Comments)    Exact allergic reaction not known   Sulfa Antibiotics Itching and Nausea Only   Erythromycin Nausea And Vomiting    Antimicrobials this admission: Zosyn 10/26 >>  Dose adjustments this admission: NA  Microbiology results: 10/26 BCx: pending 10/26 UCx: pending    Thank you for allowing pharmacy to be a part of this patient's care.  Tawnya Crook, PharmD, BCPS Clinical Pharmacist 11/19/2021 6:11 PM

## 2021-11-19 NOTE — H&P (Signed)
History and Physical    Patient: [name] DPO:[id number] DOB: 04-15-1931 DOA: 11/19/2021 DOS: the patient was seen and examined on 11/19/2021 PCP: Crist Infante, MD  Patient coming from: Home.   Chief Complaint:  Chief Complaint  Patient presents with   Weakness    Recent DX of UTI states she believes she may still have UTI despite currently being on ABX    HPI: [name] is a 88 y.o. female with PMH of paroxysmal A-fib on Eliquis, OSA on CPAP, CVA, HTN, HLD, recent COVID-19 infection, recurrent UTI on prophylactic antibiotics and recent hospitalization for encephalopathy and possible UTI returning with generalized weakness, "fever", chills, nausea and dysuria.  Patient was hospitalized 10/17-10/24 for acute encephalopathy and possible UTI.  At that time, urine culture grew pansensitive Enterococcus faecalis.  She was initially treated with ceftriaxone and transition to p.o. Augmentin.  Infectious disease consulted due to fever despite appropriate antibiotics. Urine culture finding felt to be bacteremia versus UTI since she did not have UTI symptoms at that time.  Patient's encephalopathy and fever felt to be due to recent COVID-19 infection.  Antibiotics discontinued and she was discharged home.  Per patient's daughter, patient felt well the whole day yesterday.  She was very weak with chills and "fever" when she woke up this morning.  She felt her "UTI" returned.  She also had some nausea and dysuria.  She has chronic urinary incontinence.  She wears depends.  Her temperature was 99.7 F when they checked it at home.  Patient and daughter denies abdominal pain or new back pain.  Denies URI symptoms, shortness of breath, chest pain, diarrhea, constipation, focal weakness, numbness or tingling.   Patient reports using rollator at baseline.  Denies smoking cigarette, drinking alcohol recreational drug use.  Wanted to remain full code.  In ED, normal vitals other than brief desaturation  to 88%.  Glucose 146.  ALT 118.  WBC 11.2 (17.7 on discharge).  Lactic acid 2.6.  UA with moderate Hgb, small LE, negative nitrite and many bacteria.  CXR without acute finding.  Urine and blood culture obtained.  Received a liter of NS bolus.  Hospitalist service called for admission for UTI.  Per EDP, patient had dysuria, fever to 101 and poor p.o. intake concerning for UTI.   Review of Systems: As mentioned in the history of present illness. All other systems reviewed and are negative. Past Medical History:  Diagnosis Date   Atrial fibrillation [facility])    Prior treatment with amiodarone, held sinus rhythm and drug stopped   Atrial flutter ([place])    Status post ablation prior to  2009   Chest pain    Nuclear, 2004, no scar or ischemia, EF 77%   Ejection fraction    EF 70%, nuclear,  2004  //      Fall    July, 2014   FUO (fever of unknown origin) 11/15/2021   H/O bladder repair surgery    OSA (obstructive sleep apnea)    Right Achilles tendinitis    Sciatica    Past Surgical History:  Procedure Laterality Date   IR CT HEAD LTD  11/14/2019   IR PERCUTANEOUS ART THROMBECTOMY/INFUSION INTRACRANIAL INC DIAG ANGIO  11/14/2019   IR RADIOLOGIST EVAL & MGMT  02/20/2020   IR US GUIDE VASC ACCESS RIGHT  11/14/2019   no surgical hx     RADIOLOGY WITH ANESTHESIA N/A 11/13/2019   Procedure: IR WITH ANESTHESIA;  Surgeon: Luanne Bras, MD;  Location:  [facility] OR;  Service: Radiology;  Laterality: N/A;   Social History:  reports that she has quit smoking. Her smoking use included cigarettes. She has a 10.00 pack-year smoking history. She has never used smokeless tobacco. She reports current alcohol use. She reports that she does not use drugs.  Allergies  Allergen Reactions   Bee Venom Anaphylaxis   Mixed Vespid Venom Anaphylaxis   Codeine Other (See Comments)    Exact allergic reaction not known   Sulfa Antibiotics Itching and Nausea Only   Erythromycin Nausea And Vomiting    Family  History  Problem Relation Age of Onset   Cancer Mother    Cancer Father        pancreatic cancer   Heart disease Brother     Prior to Admission medications   Medication Sig Start Date End Date Taking? Authorizing Provider  amLODipine (NORVASC) 2.5 MG tablet Take 1 tablet (2.5 mg total) by mouth daily. 02/25/21 02/25/22  Richardson Dopp T, PA-C  apixaban (ELIQUIS) 5 MG TABS tablet Take 1 tablet (5 mg total) by mouth 2 (two) times daily. 07/20/21 07/20/22  Jerline Pain, MD  ascorbic acid (VITAMIN C) 500 MG tablet Take 1 tablet (500 mg total) by mouth daily. 11/03/21   Sheikh, Georgina Quint Latif, DO  atorvastatin (LIPITOR) 10 MG tablet Take 1 tablet (10 mg total) by mouth at bedtime. Hold for a week until repeat LFTs are obtained and imprvoing Patient not taking: Reported on 11/10/2021 11/09/21 11/09/22  Raiford Noble Latif, DO  cephALEXin (KEFLEX) 250 MG capsule Take 250 mg by mouth daily. 01/31/20   [provider]  Cholecalciferol (VITAMIN D3) 50 MCG (2000 UT) CAPS Take 2,000 Units by mouth at bedtime.    [provider]  CRANBERRY PO Take 650 mg by mouth daily.    [provider]  Cyanocobalamin (VITAMIN B12 PO) Take 1 tablet by mouth at bedtime.    [provider]  FIBER ADULT GUMMIES PO Take 2 tablets by mouth daily.    [provider]  GEMTESA 75 MG TABS Take 75 mg by mouth daily.    [provider]  guaiFENesin-dextromethorphan (ROBITUSSIN DM) 100-10 MG/5ML syrup Take 10 mLs by mouth every 4 (four) hours as needed for cough. 11/02/21   Sheikh, Omair Latif, DO  Ipratropium-Albuterol (COMBIVENT) 20-100 MCG/ACT AERS respimat Inhale 1 puff into the lungs every 6 (six) hours as needed for wheezing or shortness of breath. 11/02/21   Sheikh, Georgina Quint Latif, DO  Multiple Vitamins-Minerals (SENIOR VITES PO) Take 1 tablet by mouth daily with breakfast.    [provider]  ondansetron (ZOFRAN) 4 MG tablet Take 1 tablet (4 mg total) by mouth every 6 (six)  hours as needed for nausea. 11/02/21   Raiford Noble Latif, DO  oxyCODONE (OXY IR/ROXICODONE) 5 MG immediate release tablet Take 5 mg by mouth every 4 (four) hours as needed (for pain).    [provider]  Probiotic Product ([place]) CAPS Take 1 capsule by mouth daily.    [provider]  zinc sulfate 220 (50 Zn) MG capsule Take 1 capsule (220 mg total) by mouth daily. 11/03/21   Kerney Elbe, DO    Physical Exam: Vitals:   11/19/21 1500 11/19/21 1600 11/19/21 1847 11/19/21 1900  BP: (!) 163/63 134/71 (!) 153/80 (!) 149/76  Pulse: 68 71 (!) 59 (!) 57  Resp: (!) '23 19 16   '$ Temp:   98 F (36.7 C)   TempSrc:  Oral   SpO2: 94% 96% 96% 98%  Weight:      Height:       GENERAL: No apparent distress.  Nontoxic. HEENT: MMM.  Vision and hearing grossly intact.  Dressing over nose DCI. NECK: Supple.  No apparent JVD.  RESP:  No IWOB.  Fair aeration bilaterally. CVS:  RRR. Heart sounds normal.  ABD/GI/GU: BS+. Abd soft, NTND.  MSK/EXT:  Moves extremities. No apparent deformity. No edema.  SKIN: no apparent skin lesion or wound NEURO: Awake and alert. Oriented appropriately.  No apparent focal neuro deficit. PSYCH: Calm. Normal affect.   Data Reviewed: See HPI  Assessment and Plan: Principal Problem:   UTI (urinary tract infection) Active Problems:   PAF (paroxysmal atrial fibrillation) (HCC)   Essential hypertension   Emphysema of lung (HCC)   Obstructive sleep apnea   Generalized weakness   Hyperglycemia   Lactic acidosis   History of COVID-19   Dehydration  Urinary tract infection: endorses dysuria, chills, nausea and "fever" although her temp was only 99.7 F.  UA with bacteriuria, moderate Hgb, trace LE and negative nitrite.  She has lactic acidosis although this could be due to dehydration.  Hemodynamically stable. -IV Zosyn for broader coverage given previous history of GNR and GPR UTI. -Follow urine and blood cultures -IV  fluid  Generalized weakness: Could be due to UTI, dehydration or recent COVID-19 infection -Treat treatable causes -PT/OT eval -Check TSH  Dehydration: as evidenced by lactic acidosis and hemoconcentration -IV LR at 100 cc an hour.  Lactic acidosis -IV fluid and recheck  Paroxysmal A-fib: Seems to be in sinus rhythm now.  Not on rate or rhythm control medication.  She has history of tachybradycardia syndrome.  However, her heart monitor in 05/2021 showed 100% A-fib -Continue home Eliquis  Emphysema: Stable.  No respiratory symptoms -Continue as needed DuoNeb  Obstructive sleep apnea -Nightly CPAP  History of COVID: Tested positive on 10/29/2021 -No isolation precaution.  "Squamous cell carcinoma" of the skin: Just had resection the day prior to presentation.  Has clear looking dressing over her nose. -Outpatient follow-up    Advance Care Planning:   Code Status: Full Code   Consults: None  Family Communication: Updated patient's daughter at bedside and another daughter over the phone  Severity of Illness: The appropriate patient status for this patient is OBSERVATION. Observation status is judged to be reasonable and necessary in order to provide the required intensity of service to ensure the patient's safety. The patient's presenting symptoms, physical exam findings, and initial radiographic and laboratory data in the context of their medical condition is felt to place them at decreased risk for further clinical deterioration. Furthermore, it is anticipated that the patient will be medically stable for discharge from the hospital within 2 midnights of admission.   Author: Mercy Riding, MD 11/19/2021 8:39 PM  For on call review [email or web address].

## 2021-11-19 NOTE — ED Notes (Signed)
Pt placed on 2L O2 via [place] secondary to low O2 sat, pt satting 88% on room air, 94% on 2L

## 2021-11-19 NOTE — Progress Notes (Signed)
PT has personal CPAP equipment, hose, and mask.

## 2021-11-20 ENCOUNTER — Encounter (HOSPITAL_COMMUNITY): Payer: Self-pay | Admitting: Student

## 2021-11-20 DIAGNOSIS — I700 Atherosclerosis of aorta: Secondary | ICD-10-CM | POA: Diagnosis not present

## 2021-11-20 DIAGNOSIS — B9689 Other specified bacterial agents as the cause of diseases classified elsewhere: Secondary | ICD-10-CM | POA: Diagnosis not present

## 2021-11-20 DIAGNOSIS — J9601 Acute respiratory failure with hypoxia: Secondary | ICD-10-CM | POA: Diagnosis not present

## 2021-11-20 DIAGNOSIS — M5431 Sciatica, right side: Secondary | ICD-10-CM | POA: Diagnosis not present

## 2021-11-20 DIAGNOSIS — R41841 Cognitive communication deficit: Secondary | ICD-10-CM | POA: Diagnosis not present

## 2021-11-20 DIAGNOSIS — Z7401 Bed confinement status: Secondary | ICD-10-CM | POA: Diagnosis not present

## 2021-11-20 DIAGNOSIS — R262 Difficulty in walking, not elsewhere classified: Secondary | ICD-10-CM | POA: Diagnosis not present

## 2021-11-20 DIAGNOSIS — I729 Aneurysm of unspecified site: Secondary | ICD-10-CM | POA: Diagnosis not present

## 2021-11-20 DIAGNOSIS — U071 COVID-19: Secondary | ICD-10-CM | POA: Diagnosis not present

## 2021-11-20 DIAGNOSIS — D75829 Heparin-induced thrombocytopenia, unspecified: Secondary | ICD-10-CM | POA: Diagnosis not present

## 2021-11-20 DIAGNOSIS — R627 Adult failure to thrive: Secondary | ICD-10-CM | POA: Diagnosis present

## 2021-11-20 DIAGNOSIS — M1990 Unspecified osteoarthritis, unspecified site: Secondary | ICD-10-CM | POA: Diagnosis not present

## 2021-11-20 DIAGNOSIS — G4733 Obstructive sleep apnea (adult) (pediatric): Secondary | ICD-10-CM | POA: Diagnosis present

## 2021-11-20 DIAGNOSIS — I10 Essential (primary) hypertension: Secondary | ICD-10-CM | POA: Diagnosis present

## 2021-11-20 DIAGNOSIS — Z885 Allergy status to narcotic agent status: Secondary | ICD-10-CM | POA: Diagnosis not present

## 2021-11-20 DIAGNOSIS — N10 Acute tubulo-interstitial nephritis: Secondary | ICD-10-CM | POA: Diagnosis not present

## 2021-11-20 DIAGNOSIS — R739 Hyperglycemia, unspecified: Secondary | ICD-10-CM | POA: Diagnosis not present

## 2021-11-20 DIAGNOSIS — Z7901 Long term (current) use of anticoagulants: Secondary | ICD-10-CM | POA: Diagnosis not present

## 2021-11-20 DIAGNOSIS — D692 Other nonthrombocytopenic purpura: Secondary | ICD-10-CM | POA: Diagnosis not present

## 2021-11-20 DIAGNOSIS — E872 Acidosis, unspecified: Secondary | ICD-10-CM | POA: Diagnosis present

## 2021-11-20 DIAGNOSIS — J310 Chronic rhinitis: Secondary | ICD-10-CM | POA: Diagnosis not present

## 2021-11-20 DIAGNOSIS — F0390 Unspecified dementia without behavioral disturbance: Secondary | ICD-10-CM | POA: Diagnosis present

## 2021-11-20 DIAGNOSIS — D7589 Other specified diseases of blood and blood-forming organs: Secondary | ICD-10-CM | POA: Diagnosis not present

## 2021-11-20 DIAGNOSIS — N1831 Chronic kidney disease, stage 3a: Secondary | ICD-10-CM | POA: Diagnosis not present

## 2021-11-20 DIAGNOSIS — I4892 Unspecified atrial flutter: Secondary | ICD-10-CM | POA: Diagnosis not present

## 2021-11-20 DIAGNOSIS — E871 Hypo-osmolality and hyponatremia: Secondary | ICD-10-CM | POA: Diagnosis not present

## 2021-11-20 DIAGNOSIS — R4189 Other symptoms and signs involving cognitive functions and awareness: Secondary | ICD-10-CM | POA: Diagnosis not present

## 2021-11-20 DIAGNOSIS — H60502 Unspecified acute noninfective otitis externa, left ear: Secondary | ICD-10-CM | POA: Diagnosis not present

## 2021-11-20 DIAGNOSIS — I471 Supraventricular tachycardia, unspecified: Secondary | ICD-10-CM | POA: Diagnosis not present

## 2021-11-20 DIAGNOSIS — N390 Urinary tract infection, site not specified: Secondary | ICD-10-CM | POA: Diagnosis present

## 2021-11-20 DIAGNOSIS — R7301 Impaired fasting glucose: Secondary | ICD-10-CM | POA: Diagnosis not present

## 2021-11-20 DIAGNOSIS — Z79899 Other long term (current) drug therapy: Secondary | ICD-10-CM | POA: Diagnosis not present

## 2021-11-20 DIAGNOSIS — E785 Hyperlipidemia, unspecified: Secondary | ICD-10-CM | POA: Diagnosis present

## 2021-11-20 DIAGNOSIS — D6869 Other thrombophilia: Secondary | ICD-10-CM | POA: Diagnosis not present

## 2021-11-20 DIAGNOSIS — I69320 Aphasia following cerebral infarction: Secondary | ICD-10-CM | POA: Diagnosis not present

## 2021-11-20 DIAGNOSIS — Z882 Allergy status to sulfonamides status: Secondary | ICD-10-CM | POA: Diagnosis not present

## 2021-11-20 DIAGNOSIS — M6281 Muscle weakness (generalized): Secondary | ICD-10-CM | POA: Diagnosis not present

## 2021-11-20 DIAGNOSIS — R2681 Unsteadiness on feet: Secondary | ICD-10-CM | POA: Diagnosis not present

## 2021-11-20 DIAGNOSIS — I495 Sick sinus syndrome: Secondary | ICD-10-CM | POA: Diagnosis present

## 2021-11-20 DIAGNOSIS — E860 Dehydration: Secondary | ICD-10-CM | POA: Diagnosis present

## 2021-11-20 DIAGNOSIS — R918 Other nonspecific abnormal finding of lung field: Secondary | ICD-10-CM | POA: Diagnosis not present

## 2021-11-20 DIAGNOSIS — M5116 Intervertebral disc disorders with radiculopathy, lumbar region: Secondary | ICD-10-CM | POA: Diagnosis not present

## 2021-11-20 DIAGNOSIS — Z8673 Personal history of transient ischemic attack (TIA), and cerebral infarction without residual deficits: Secondary | ICD-10-CM | POA: Diagnosis not present

## 2021-11-20 DIAGNOSIS — J439 Emphysema, unspecified: Secondary | ICD-10-CM | POA: Diagnosis present

## 2021-11-20 DIAGNOSIS — R32 Unspecified urinary incontinence: Secondary | ICD-10-CM | POA: Diagnosis present

## 2021-11-20 DIAGNOSIS — M5136 Other intervertebral disc degeneration, lumbar region: Secondary | ICD-10-CM | POA: Diagnosis not present

## 2021-11-20 DIAGNOSIS — N3000 Acute cystitis without hematuria: Secondary | ICD-10-CM | POA: Diagnosis not present

## 2021-11-20 DIAGNOSIS — R509 Fever, unspecified: Secondary | ICD-10-CM | POA: Diagnosis not present

## 2021-11-20 DIAGNOSIS — I129 Hypertensive chronic kidney disease with stage 1 through stage 4 chronic kidney disease, or unspecified chronic kidney disease: Secondary | ICD-10-CM | POA: Diagnosis not present

## 2021-11-20 DIAGNOSIS — Z8249 Family history of ischemic heart disease and other diseases of the circulatory system: Secondary | ICD-10-CM | POA: Diagnosis not present

## 2021-11-20 DIAGNOSIS — H5123 Internuclear ophthalmoplegia, bilateral: Secondary | ICD-10-CM | POA: Diagnosis not present

## 2021-11-20 DIAGNOSIS — Z85828 Personal history of other malignant neoplasm of skin: Secondary | ICD-10-CM | POA: Diagnosis not present

## 2021-11-20 DIAGNOSIS — H3411 Central retinal artery occlusion, right eye: Secondary | ICD-10-CM | POA: Diagnosis not present

## 2021-11-20 DIAGNOSIS — Z87891 Personal history of nicotine dependence: Secondary | ICD-10-CM | POA: Diagnosis not present

## 2021-11-20 DIAGNOSIS — R0902 Hypoxemia: Secondary | ICD-10-CM | POA: Diagnosis not present

## 2021-11-20 DIAGNOSIS — H6122 Impacted cerumen, left ear: Secondary | ICD-10-CM | POA: Diagnosis present

## 2021-11-20 DIAGNOSIS — Z800 Family history of malignant neoplasm of digestive organs: Secondary | ICD-10-CM | POA: Diagnosis not present

## 2021-11-20 DIAGNOSIS — N3001 Acute cystitis with hematuria: Secondary | ICD-10-CM | POA: Diagnosis not present

## 2021-11-20 DIAGNOSIS — I69828 Other speech and language deficits following other cerebrovascular disease: Secondary | ICD-10-CM | POA: Diagnosis not present

## 2021-11-20 DIAGNOSIS — I480 Paroxysmal atrial fibrillation: Secondary | ICD-10-CM | POA: Diagnosis present

## 2021-11-20 DIAGNOSIS — J432 Centrilobular emphysema: Secondary | ICD-10-CM | POA: Diagnosis not present

## 2021-11-20 DIAGNOSIS — Z8616 Personal history of COVID-19: Secondary | ICD-10-CM | POA: Diagnosis not present

## 2021-11-20 DIAGNOSIS — R1310 Dysphagia, unspecified: Secondary | ICD-10-CM | POA: Diagnosis not present

## 2021-11-20 DIAGNOSIS — F411 Generalized anxiety disorder: Secondary | ICD-10-CM | POA: Diagnosis present

## 2021-11-20 DIAGNOSIS — R1312 Dysphagia, oropharyngeal phase: Secondary | ICD-10-CM | POA: Diagnosis not present

## 2021-11-20 DIAGNOSIS — R531 Weakness: Secondary | ICD-10-CM | POA: Diagnosis not present

## 2021-11-20 DIAGNOSIS — I69398 Other sequelae of cerebral infarction: Secondary | ICD-10-CM | POA: Diagnosis not present

## 2021-11-20 LAB — CBC
HCT: 36 % (ref 36.0–46.0)
Hemoglobin: 12.1 g/dL (ref 12.0–15.0)
MCH: 33.2 pg (ref 26.0–34.0)
MCHC: 33.6 g/dL (ref 30.0–36.0)
MCV: 98.6 fL (ref 80.0–100.0)
Platelets: 308 10*3/uL (ref 150–400)
RBC: 3.65 MIL/uL — ABNORMAL LOW (ref 3.87–5.11)
RDW: 13 % (ref 11.5–15.5)
WBC: 8.9 10*3/uL (ref 4.0–10.5)
nRBC: 0.7 % — ABNORMAL HIGH (ref 0.0–0.2)

## 2021-11-20 LAB — URINE CULTURE

## 2021-11-20 LAB — CK: Total CK: 25 U/L — ABNORMAL LOW (ref 38–234)

## 2021-11-20 LAB — COMPREHENSIVE METABOLIC PANEL
ALT: 84 U/L — ABNORMAL HIGH (ref 0–44)
AST: 23 U/L (ref 15–41)
Albumin: 2.6 g/dL — ABNORMAL LOW (ref 3.5–5.0)
Alkaline Phosphatase: 48 U/L (ref 38–126)
Anion gap: 9 (ref 5–15)
BUN: 13 mg/dL (ref 8–23)
CO2: 25 mmol/L (ref 22–32)
Calcium: 8.1 mg/dL — ABNORMAL LOW (ref 8.9–10.3)
Chloride: 103 mmol/L (ref 98–111)
Creatinine, Ser: 0.81 mg/dL (ref 0.44–1.00)
GFR, Estimated: >60 mL/min (ref >60)
Glucose, Bld: 135 mg/dL — ABNORMAL HIGH (ref 70–99)
Potassium: 3.9 mmol/L (ref 3.5–5.1)
Sodium: 137 mmol/L (ref 135–145)
Total Bilirubin: 0.9 mg/dL (ref 0.3–1.2)
Total Protein: 5.6 g/dL — ABNORMAL LOW (ref 6.5–8.1)

## 2021-11-20 LAB — AMMONIA: Ammonia: 26 umol/L (ref 9–35)

## 2021-11-20 LAB — TSH: TSH: 2.638 u[IU]/mL (ref 0.350–4.500)

## 2021-11-20 LAB — HEMOGLOBIN A1C
Hgb A1c MFr Bld: 6.4 % — ABNORMAL HIGH (ref 4.8–5.6)
Mean Plasma Glucose: 136.98 mg/dL

## 2021-11-20 LAB — LACTIC ACID, PLASMA
Lactic Acid, Venous: 4.6 mmol/L (ref 0.5–1.9)
Lactic Acid, Venous: 1.8 mmol/L (ref 0.5–1.9)

## 2021-11-20 LAB — MAGNESIUM: Magnesium: 2.3 mg/dL (ref 1.7–2.4)

## 2021-11-20 LAB — PROCALCITONIN: Procalcitonin: <0.1 ng/mL

## 2021-11-20 LAB — PHOSPHORUS: Phosphorus: 3.5 mg/dL (ref 2.5–4.6)

## 2021-11-20 MED ORDER — AMLODIPINE BESYLATE 5 MG PO TABS
5.0000 mg | ORAL_TABLET | Freq: Every day | ORAL | Status: DC
  Administered 2021-11-20 – 2021-11-27 (×8): 5 mg via ORAL
  Filled 2021-11-20 (×8): qty 1

## 2021-11-20 MED ORDER — PHENAZOPYRIDINE HCL 100 MG PO TABS
100.0000 mg | ORAL_TABLET | Freq: Three times a day (TID) | ORAL | Status: AC
  Administered 2021-11-20 – 2021-11-22 (×6): 100 mg via ORAL
  Filled 2021-11-20 (×6): qty 1

## 2021-11-20 MED ORDER — LACTATED RINGERS IV SOLN: INTRAVENOUS | Status: AC

## 2021-11-20 NOTE — Evaluation (Signed)
Occupational Therapy Evaluation Patient Details Name: [name] MRN: [id number] DOB: Dec 26, 1931 Today's Date: 11/20/2021   History of Present Illness [name] is a 88 y.o. female with PMH of paroxysmal A-fib on Eliquis, OSA on CPAP, CVA, HTN, HLD, recent COVID-19 infection, recurrent UTI on prophylactic antibiotics and recent hospitalization for encephalopathy and possible UTI returning with generalized weakness, "fever", chills, nausea and dysuria.     Patient was hospitalized 10/17-10/24 for acute encephalopathy and possible UTI.  At that time, urine culture grew pansensitive Enterococcus faecalis.  She was initially treated with ceftriaxone and transition to p.o. Augmentin.  Infectious disease consulted due to fever despite appropriate antibiotics. Urine culture finding felt to be bacteremia versus UTI since she did not have UTI symptoms at that time.  Patient's encephalopathy and fever felt to be due to recent COVID-19 infection.  Antibiotics discontinued and she was discharged home.     Per patient's daughter, patient felt well the whole day yesterday.  She was very weak with chills and "fever" when she woke up this morning.  She felt her "UTI" returned.  She also had some nausea and dysuria.  She has chronic urinary incontinence.  She wears depends.  Her temperature was 99.7 F when they checked it at home.  Patient and daughter denies abdominal pain or new back pain.  Denies URI symptoms, shortness of breath, chest pain, diarrhea, constipation, focal weakness, numbness or tingling.      Patient reports using rollator at baseline.  Denies smoking cigarette, drinking alcohol recreational drug use.  Wanted to remain full code.     In ED, normal vitals other than brief desaturation to 88%.  Glucose 146.  ALT 118.  WBC 11.2 (17.7 on discharge).  Lactic acid 2.6.  UA with moderate Hgb, small LE, negative nitrite and many bacteria.  CXR without acute finding.  Urine and blood culture obtained.   Received a liter of NS bolus.  Hospitalist service called for admission for UTI.  Per EDP, patient had dysuria, fever to 101 and poor p.o. intake concerning for UTI.   Clinical Impression   Patient is currently presenting significantly below her baseline level of functioning for ADL management, given weakness, deconditioning, impaired functional mobility, decreased activity tolerance, and intermittent dizziness with activity. She presented with poor sitting balance edge of bed, in conjunction with pronounced generalized shakiness. As such, attempts were deferred at out of bed activity. At current, she required increased assist for self-care tasks such as bathing, dressing, and toileting. Without further OT services, she is at risk for further weakness and deconditioning, as well as restricted participation in meaningful activities.      Recommendations for follow up therapy are one component of a multi-disciplinary discharge planning process, led by the attending physician.  Recommendations may be updated based on patient status, additional functional criteria and insurance authorization.   Follow Up Recommendations  Skilled nursing-short term rehab (<3 hours/day)    Assistance Recommended at Discharge Frequent or constant Supervision/Assistance  Patient can return home with the following Assistance with cooking/housework;A lot of help with walking and/or transfers;A lot of help with bathing/dressing/bathroom;Assist for transportation;Help with stairs or ramp for entrance;Direct supervision/assist for medications management    Functional Status Assessment  Patient has had a recent decline in their functional status and demonstrates the ability to make significant improvements in function in a reasonable and predictable amount of time.  Equipment Recommendations  None recommended by OT       Precautions /  Restrictions Precautions Precautions: Fall Restrictions Weight Bearing Restrictions: No       Mobility Bed Mobility Overal bed mobility: Needs Assistance Bed Mobility: Supine to Sit, Sit to Supine Rolling: Min assist   Supine to sit: Mod assist Sit to supine: Mod assist        Transfers          General transfer comment: attempts deferred due to pt presenting with poor sitting balance and shakiness seated EOB      Balance Overall balance assessment: Needs assistance   Sitting balance-Leahy Scale: Poor                    ADL either performed or assessed with clinical judgement     Vision Patient Visual Report: No change from baseline              Pertinent Vitals/Pain Pain Assessment Pain Assessment: No/denies pain     Hand Dominance Right   Extremity/Trunk Assessment Upper Extremity Assessment Upper Extremity Assessment: Generalized weakness   Lower Extremity Assessment Lower Extremity Assessment: Generalized weakness       Communication Communication Communication: No difficulties   Cognition Arousal/Alertness: Awake/alert   Overall Cognitive Status: Impaired/Different from baseline Area of Impairment: Orientation, Attention, Following commands        Following Commands: Follows one step commands with increased time (and occasional repetition)                        Home Living Family/patient expects to be discharged to:: Private residence Living Arrangements: Alone Available Help at Discharge: Family Type of Home: House Home Access: Stairs to enter [employer] of Steps: 1   Home Layout: Two level;Able to live on main level with bedroom/bathroom Alternate Level Stairs-Number of Steps: her bedroom and bathroom are downstairs on the main level   Bathroom Shower/Tub: Walk-in shower;Tub/shower unit         Home Equipment: Conservation officer, nature (2 wheels);Cane - single point;Rollator (4 wheels);BSC/3in1;Shower seat   Additional Comments: BSC commode frame is over her toilet      Prior  Functioning/Environment               Mobility Comments: pt reports "furniture walking"  in the home, using AD or holding to family in community ADLs Comments: pt reports ind with bathing, dressing, feeding, toileting; pt has family assisting with cleaning and cooking        OT Problem List: Decreased strength;Decreased activity tolerance;Impaired balance (sitting and/or standing);Decreased safety awareness;Decreased knowledge of use of DME or AE;Decreased coordination      OT Treatment/Interventions: Self-care/ADL training;Therapeutic exercise;Neuromuscular education;Energy conservation;Cognitive remediation/compensation;DME and/or AE instruction;Patient/family education;Therapeutic activities    OT Goals(Current goals can be found in the care plan section) Acute Rehab OT Goals Patient Stated Goal: to get better OT Goal Formulation: With patient/family Time For Goal Achievement: 12/04/21 Potential to Achieve Goals: Good ADL Goals Pt Will Perform Grooming: with supervision;standing Pt Will Perform Upper Body Dressing: with set-up;sitting Pt Will Perform Lower Body Dressing: with supervision;sit to/from stand Pt Will Transfer to Toilet: with supervision;ambulating Pt Will Perform Toileting - Clothing Manipulation and hygiene: with supervision;sit to/from stand  OT Frequency: Min 2X/week       AM-PAC OT "6 Clicks" Daily Activity     Outcome Measure Help from another person eating meals?: None Help from another person taking care of personal grooming?: A Little Help from another person toileting, which includes using toliet, bedpan, or urinal?: A  Lot Help from another person bathing (including washing, rinsing, drying)?: A Lot Help from another person to put on and taking off regular upper body clothing?: A Lot Help from another person to put on and taking off regular lower body clothing?: Total 6 Click Score: 14   End of Session Nurse Communication: Other (comment) (Pt's  daughter's request for pt's vitals to be taken)  Activity Tolerance: Other (comment) (Fair overall tolerance; limited by weakness) Patient left: in bed;with call bell/phone within reach;with family/visitor present;with bed alarm set  OT Visit Diagnosis: Muscle weakness (generalized) (M62.81)                Time: 1640-1701 OT Time Calculation (min): 21 min Charges:  OT General Charges $OT Visit: 1 Visit OT Evaluation $OT Eval Moderate Complexity: 1 Mod    Elishah Ashmore L Cherylee Rawlinson, OTR/L 11/20/2021, 5:16 PM

## 2021-11-20 NOTE — Progress Notes (Signed)
PHARMACY - PHYSICIAN COMMUNICATION CRITICAL VALUE ALERT - BLOOD CULTURE IDENTIFICATION (BCID)  [name] is an 88 y.o. female who presented to [facility] on 11/19/2021 with a chief complaint of UTI  Assessment:  Positive culture reported to pharmacy, 1 of 4 blood culture bottles is growing gram positive rods. No BCID is performed for GPR.  Possible contaminant with only 1/4 bottles positive.    Name of physician (or Provider) Contacted: Dr. Cyndia Skeeters  Current antibiotics: Zosyn  Changes to prescribed antibiotics recommended: None    Gretta Arab PharmD, BCPS Clinical Pharmacist [facility] main pharmacy [phone] 11/20/2021 10:25 AM

## 2021-11-20 NOTE — TOC Initial Note (Signed)
Transition of Care [facility]) - Initial/Assessment Note    Patient Details  Name: [name] MRN: [id number] Date of Birth: November 08, 1931  Transition [facility]) CM/SW Contact:    Roseanne Kaufman, RN Phone Number: 11/20/2021, 3:59 PM  Clinical Narrative:     PT recommended short term SNF. Spoke with patient and daughter Seth Bake to explain SNF placement process. Daughter reports patient's SNF preference is [facility].   Faxed out awaiting bed offers, choice. Patient has Medicare who requires 3 midnights, however is [facility]. Notified [place] with [facility] of possible [facility] waiver.  TOC will continue to follow.               Expected Discharge Plan: Skilled Nursing Facility Barriers to Discharge: Continued Medical Work up   Patient Goals and CMS Choice Patient states their goals for this hospitalization and ongoing recovery are:: Rehab at [facility] CMS Medicare.gov Compare Post Acute Care list provided to:: Patient Represenative (must comment) Seth Bake (daughter)) Choice offered to / list presented to : Patient, Adult Children Rande Lawman (daughter))  Expected Discharge Plan and Services Expected Discharge Plan: [place] In-house Referral: NA Discharge Planning Services: CM Consult Post Acute Care Choice: [place] Living arrangements for the past 2 months: Single Family Home                           HH Arranged: NA [place] Agency: NA        Prior Living Arrangements/Services Living arrangements for the past 2 months: Single Family Home Lives with:: Self Patient language and need for interpreter reviewed:: Yes        Need for Family Participation in Patient Care: No (Comment) Care giver support system in place?: Yes (comment) Current home services: Home PT Criminal Activity/Legal Involvement Pertinent to Current Situation/Hospitalization: No - Comment as needed  Activities of Daily Living Home Assistive Devices/Equipment: None ADL Screening (condition  at time of admission) Patient's cognitive ability adequate to safely complete daily activities?: Yes Is the patient deaf or have difficulty hearing?: No Does the patient have difficulty seeing, even when wearing glasses/contacts?: No Does the patient have difficulty concentrating, remembering, or making decisions?: No Patient able to express need for assistance with ADLs?: Yes Does the patient have difficulty dressing or bathing?: Yes Independently performs ADLs?: No Communication: Independent Dressing (OT): Needs assistance Is this a change from baseline?: Change from baseline, expected to last >3 days Grooming: Independent Feeding: Independent Bathing: Needs assistance Is this a change from baseline?: Change from baseline, expected to last >3 days Toileting: Needs assistance Is this a change from baseline?: Change from baseline, expected to last >3days In/Out Bed: Needs assistance Is this a change from baseline?: Change from baseline, expected to last >3 days Walks in Home: Dependent Is this a change from baseline?: Change from baseline, expected to last >3 days Does the patient have difficulty walking or climbing stairs?: Yes Weakness of Legs: Both Weakness of Arms/Hands: Both  Permission Sought/Granted Permission sought to share information with : Case Manager Permission granted to share information with : Yes, Verbal Permission Granted  Share Information with NAME: Case Manager           Emotional Assessment Appearance:: Appears stated age Attitude/Demeanor/Rapport: Engaged Affect (typically observed): Accepting Orientation: : Oriented to Place, Oriented to Self, Oriented to  Time Alcohol / Substance Use: Not Applicable Psych Involvement: No (comment)  Admission diagnosis:  Dehydration [E86.0] UTI (urinary tract infection) [N39.0]  Weakness [R53.1] Acute cystitis without hematuria [N30.00] Patient Active Problem List   Diagnosis Date Noted   UTI (urinary tract  infection) 11/19/2021   Hyperglycemia 11/19/2021   Lactic acidosis 11/19/2021   History of COVID-19 11/19/2021   Dehydration 11/19/2021   Asymptomatic bacteriuria 11/16/2021   FUO (fever of unknown origin) 11/15/2021   Hypokalemia 11/12/2021   Generalized weakness 11/11/2021   Hypoalbuminemia 11/03/2021   Abnormal LFTs 11/03/2021   COVID-19 virus infection 10/30/2021   Tachycardia-bradycardia syndrome ([place]) 05/19/2021   Obstructive sleep apnea 05/19/2021   Pseudoaneurysm of femoral artery following procedure ([place]) 11/23/2019   CAP (community acquired pneumonia) 11/23/2019   Essential hypertension 11/23/2019   Hyperlipidemia LDL goal <70 11/23/2019   Emphysema of lung ([place]) 11/23/2019   Aortic atherosclerosis ([place]) 11/23/2019   Acute ischemic stroke (HCC)    Pseudoaneurysm (HCC)    Slow transit constipation    Acute ischemic right PCA stroke ([place]) s/p tPA and mechanical thrombectomy, d/t AF not on [facility] 11/14/2019   History of stroke 11/13/2019   Degeneration of lumbar intervertebral disc 02/01/2019   Impacted cerumen of both ears 07/25/2015   Sensorineural hearing loss (SNHL), bilateral 07/25/2015   Hymenoptera allergy 10/05/2014   SVT (supraventricular tachycardia) 10/20/2012   PAF (paroxysmal atrial fibrillation) ([place])    Atrial flutter ([place])    PCP:  Crist Infante, MD Pharmacy:   [facility], [place] [address] Phone: [phone] Fax: [phone]     Social Determinants of Health (SDOH) Interventions    Readmission Risk Interventions    11/02/2021   12:11 PM  Readmission Risk Prevention Plan  Transportation Screening Complete  PCP or Specialist Appt within 5-7 Days Complete  Home Care Screening Complete  Medication Review (RN CM) Complete

## 2021-11-20 NOTE — Consult Note (Signed)
   [facility] Inpatient Consult   11/20/2021  [name] 17-Mar-1931 [id number]  [place] Organization [ACO] Patient: Medicare [facility] Liaison remote coverage for [facility] referral:  Medicare waiver for SNF from inpatient [facility] RNCM, Hulan Amato  Primary Care Provider:  Crist Infante, MD at [facility]  Patient screened for less than 7 days readmission hospitalization with noted medium risk score for unplanned readmission risk and to assess for potential [place] [facility] service needs for post hospital transition.  Review of patient's medical record reveals patient was unable to provide self care from previous hospitalization.   Plan:  Continue to follow progress and disposition to assess for post hospital care management needs.  If patient goes to a [facility] affiliated facility then patient can be followed by [facility] RN for community transitional needs from SNF.  For questions contact:   Natividad Brood, RN BSN [place]  [phone] business mobile phone Toll free office [phone]  *[place]  [phone] Fax number: [phone] [email or web address]

## 2021-11-20 NOTE — Progress Notes (Signed)
PROGRESS NOTE  [name] ACZ:[id number] DOB: [birth year]   PCP: Crist Infante, MD  Patient is from: Home.  DOA: 11/19/2021 LOS: 0  Chief complaints Chief Complaint  Patient presents with   Weakness    Recent DX of UTI states she believes she may still have UTI despite currently being on ABX      Brief Narrative / Interim history: 88 y.o. female with PMH of PAF on Eliquis, OSA on CPAP, CVA, MCI, HTN, HLD, recent COVID-19 infection, recurrent UTI on prophylactic antibiotics and recent hospitalization for encephalopathy and possible UTI returning with generalized weakness, "fever", chills, nausea and dysuria, and admitted for possible UTI, dehydration, generalized weakness and failure to thrive.  Started on IV fluid and IV Zosyn.  Per family, very independent and active until recent hospitalization for COVID-19.  However, some decline in cognitive function since she had stroke in 2021.  Subjective: Seen and examined earlier this morning.  Patient's daughter at bedside.  Patient reports severe dysuria when she tried to urinate this morning.  She is somewhat anxious and apprehensive.  She feels weak.  Daughter is concerned about her physical and mental decline.  Concerned about ongoing dysuria and recurrent hospitalization.  Per daughters, very independent and active until recent hospitalization for COVID earlier this month.  However, they noted some decline in her cognition since she had a stroke in 2021.  Objective: Vitals:   11/20/21 1036 11/20/21 1245 11/20/21 1640 11/20/21 1731  BP:  (!) 171/74  139/65  Pulse:  77  83  Resp:  18  20  Temp:  97.8 F (36.6 C)  99.5 F (37.5 C)  TempSrc:  Oral  Oral  SpO2: 93% 96% 92% 93%  Weight:      Height:        Examination:  GENERAL: No apparent distress.  Nontoxic. HEENT: MMM.  Vision and hearing grossly intact.  NECK: Supple.  No apparent JVD.  RESP:  No IWOB.  Fair aeration bilaterally. CVS:  RRR. Heart sounds normal.  ABD/GI/GU:  BS+. Abd soft, NTND.  No perineal or labial lesions.  MSK/EXT:  Moves extremities. No apparent deformity. No edema.  SKIN: no apparent skin lesion or wound NEURO: Awake, alert and oriented x4 except date.  No apparent focal neuro deficit. PSYCH: Somewhat anxious.  Patient's RN, Lennox Laity and patient's daughter [name] at bedside during perineal exam  Procedures:  None  Microbiology summarized: Urine culture pending. Blood culture with GPR in 1 out of 2 aerobic bottles  Assessment and plan: Principal Problem:   UTI (urinary tract infection) Active Problems:   PAF (paroxysmal atrial fibrillation) (HCC)   Essential hypertension   Emphysema of lung (HCC)   Obstructive sleep apnea   Generalized weakness   Hyperglycemia   Lactic acidosis   History of COVID-19   Dehydration  Urinary tract infection: Continues to endorse dysuria despite treatment.  Remains afebrile other than mild temp to 99.5  here. UA with bacteriuria, moderate Hgb, trace LE and negative nitrite.  Blood culture with GPR in 1 out of 2 aerobic bottles likely contaminant.  She has lactic acidosis but a negative procalcitonin.  Hemodynamically stable. -Continue IV Zosyn for broader coverage given previous history of GNR and GPR UTI. -Follow urine and blood cultures -Continue IV fluid -Start Pyridium for dysuria   Generalized weakness/failure to thrive: Family noted some cognitive decline and she had a stroke 2 years ago.  However, she was physically active until recent hospitalization for COVID earlier this  month.  She has been deconditioned since then.  She might have progressive dementia as a result of COVID.  No focal neurodeficit.  TSH within normal. -Treat treatable causes -PT/OT-recommended SNF. -Check TSH   Dehydration: as evidenced by lactic acidosis and hemoconcentration -Continue IV fluid hydration   Lactic acidosis: Resolved.   Paroxysmal A-fib: Seems to be in sinus rhythm now.  Not on rate or  rhythm control medication.  She has history of tachybradycardia syndrome.  However, her heart monitor in 05/2021 showed 100% A-fib -Continue home Eliquis   Emphysema: Stable.  No respiratory symptoms -Continue as needed DuoNeb   Obstructive sleep apnea -Nightly CPAP   History of COVID: Tested positive on 10/29/2021 -No isolation precaution.   "Squamous cell carcinoma" of the skin: Just had resection the day prior to presentation.  Has clear looking dressing over her nose. -Outpatient follow-up  Body mass index is 29.29 kg/m.           DVT prophylaxis:   apixaban (ELIQUIS) tablet 5 mg  Code Status: Full code Family Communication: Updated patient's daughter at the bedside and another daughter over the phone Level of care: Med-Surg Status is: Inpatient Remains inpatient appropriate because: Due to UTI, failure to thrive and physical deconditioning   Final disposition: SNF? Consultants:  None  Sch Meds:  Scheduled Meds:  amLODipine  5 mg Oral Daily   apixaban  5 mg Oral BID   cholecalciferol  2,000 Units Oral QHS   mirabegron ER  25 mg Oral Daily   multivitamin with minerals  1 tablet Oral Daily   phenazopyridine  100 mg Oral TID WC   Continuous Infusions:  lactated ringers 75 mL/hr at 11/20/21 1016   piperacillin-tazobactam (ZOSYN)  IV 3.375 g (11/20/21 1017)   PRN Meds:.acetaminophen **OR** acetaminophen, ipratropium-albuterol, ondansetron **OR** ondansetron (ZOFRAN) IV, oxyCODONE, polyethylene glycol, senna-docusate  Antimicrobials: Anti-infectives (From admission, onward)    Start     Dose/Rate Route Frequency Ordered Stop   11/19/21 1815  piperacillin-tazobactam (ZOSYN) IVPB 3.375 g        3.375 g 12.5 mL/hr over 240 Minutes Intravenous Every 12 hours 11/19/21 1802     11/19/21 1715  vancomycin (VANCOCIN) IVPB 1000 mg/200 mL premix  Status:  Discontinued        1,000 mg 200 mL/hr over 60 Minutes Intravenous  Once 11/19/21 1704 11/19/21 1738        I  have personally reviewed the following labs and images: CBC: Recent Labs  Lab 11/15/21 0542 11/16/21 0453 11/17/21 0510 11/19/21 1351 11/20/21 0422  WBC 5.5 15.7* 17.7* 11.2* 8.9  NEUTROABS  --   --  15.3* 8.1*  --   HGB 12.5 11.6* 11.3* 12.5 12.1  HCT 37.6 34.8* 34.0* 37.2 36.0  MCV 98.9 98.3 99.1 99.5 98.6  PLT 161 221 278 350 308   BMP &GFR Recent Labs  Lab 11/15/21 0542 11/16/21 0453 11/19/21 1351 11/20/21 0422  NA 139 139 136 137  K 3.7 3.8 3.9 3.9  CL 104 103 102 103  CO2 '28 25 25 25  '$ GLUCOSE 193* 212* 146* 135*  BUN '22 23 16 13  '$ CREATININE 0.80 0.76 0.84 0.81  CALCIUM 8.6* 8.7* 8.1* 8.1*  MG 2.5* 2.3  --  2.3  PHOS  --   --   --  3.5   Estimated Creatinine Clearance: 41.3 mL/min (by C-G formula based on SCr of 0.81 mg/dL). Liver & Pancreas: Recent Labs  Lab 11/19/21 1351 11/20/21 0422  AST 33  23  ALT 118* 84*  ALKPHOS 55 48  BILITOT 1.1 0.9  PROT 6.1* 5.6*  ALBUMIN 2.9* 2.6*   No results for input(s): "LIPASE", "AMYLASE" in the last 168 hours. Recent Labs  Lab 11/20/21 0422  AMMONIA 26   Diabetic: Recent Labs    11/20/21 0422  HGBA1C 6.4*   Recent Labs  Lab 11/19/21 1358  GLUCAP 146*   Cardiac Enzymes: Recent Labs  Lab 11/20/21 0422  CKTOTAL 25*   No results for input(s): "PROBNP" in the last 8760 hours. Coagulation Profile: No results for input(s): "INR", "PROTIME" in the last 168 hours. Thyroid Function Tests: Recent Labs    11/20/21 0422  TSH 2.638   Lipid Profile: No results for input(s): "CHOL", "HDL", "LDLCALC", "TRIG", "CHOLHDL", "LDLDIRECT" in the last 72 hours. Anemia Panel: No results for input(s): "VITAMINB12", "FOLATE", "FERRITIN", "TIBC", "IRON", "RETICCTPCT" in the last 72 hours. Urine analysis:    Component Value Date/Time   COLORURINE STRAW (A) 11/19/2021 1349   APPEARANCEUR CLOUDY (A) 11/19/2021 1349   LABSPEC 1.003 (L) 11/19/2021 1349   PHURINE 8.0 11/19/2021 1349   GLUCOSEU NEGATIVE 11/19/2021 1349    HGBUR MODERATE (A) 11/19/2021 1349   BILIRUBINUR NEGATIVE 11/19/2021 1349   KETONESUR NEGATIVE 11/19/2021 1349   PROTEINUR NEGATIVE 11/19/2021 1349   NITRITE NEGATIVE 11/19/2021 1349   LEUKOCYTESUR SMALL (A) 11/19/2021 1349   Sepsis Labs: Invalid input(s): "PROCALCITONIN", "LACTICIDVEN"  Microbiology: Recent Results (from the past 240 hour(s))  Respiratory (~20 pathogens) panel by PCR     Status: None   Collection Time: 11/14/21 10:21 AM   Specimen: Nasopharyngeal Swab; Respiratory  Result Value Ref Range Status   Adenovirus NOT DETECTED NOT DETECTED Final   Coronavirus 229E NOT DETECTED NOT DETECTED Final    Comment: (NOTE) The Coronavirus on the Respiratory Panel, DOES NOT test for the novel  Coronavirus (2019 nCoV)    Coronavirus HKU1 NOT DETECTED NOT DETECTED Final   Coronavirus NL63 NOT DETECTED NOT DETECTED Final   Coronavirus OC43 NOT DETECTED NOT DETECTED Final   Metapneumovirus NOT DETECTED NOT DETECTED Final   Rhinovirus / Enterovirus NOT DETECTED NOT DETECTED Final   Influenza A NOT DETECTED NOT DETECTED Final   Influenza B NOT DETECTED NOT DETECTED Final   Parainfluenza Virus 1 NOT DETECTED NOT DETECTED Final   Parainfluenza Virus 2 NOT DETECTED NOT DETECTED Final   Parainfluenza Virus 3 NOT DETECTED NOT DETECTED Final   Parainfluenza Virus 4 NOT DETECTED NOT DETECTED Final   Respiratory Syncytial Virus NOT DETECTED NOT DETECTED Final   Bordetella pertussis NOT DETECTED NOT DETECTED Final   Bordetella Parapertussis NOT DETECTED NOT DETECTED Final   Chlamydophila pneumoniae NOT DETECTED NOT DETECTED Final   Mycoplasma pneumoniae NOT DETECTED NOT DETECTED Final    Comment: Performed at [facility], [place]. [address], [place], [place] [address]  Culture, blood (Routine X 2) w Reflex to ID Panel     Status: None   Collection Time: 11/14/21 11:19 AM   Specimen: BLOOD  Result Value Ref Range Status   Specimen Description   Final    BLOOD RIGHT  ANTECUBITAL Performed at [facility] [address], [place], [place] [address]    Special Requests   Final    BOTTLES DRAWN AEROBIC ONLY Blood Culture adequate volume Performed at [facility] [address] [place], [place] [address]    Culture   Final    NO GROWTH 5 DAYS Performed at [facility]  Lab, [address], [place], [place] [address]    Report Status 11/19/2021 FINAL  Final  Culture, blood (Routine X 2) w Reflex to ID Panel     Status: None   Collection Time: 11/14/21 11:19 AM   Specimen: BLOOD  Result Value Ref Range Status   Specimen Description   Final    BLOOD BLOOD RIGHT HAND Performed at [facility] [address], [place], [place] [address]    Special Requests   Final    BOTTLES DRAWN AEROBIC ONLY Blood Culture adequate volume Performed at [facility] [address] [place], [place] [address]    Culture   Final    NO GROWTH 5 DAYS Performed at [facility], [place] [address] [place], [place] [address]    Report Status 11/19/2021 FINAL  Final  Blood culture (routine x 2)     Status: None (Preliminary result)   Collection Time: 11/19/21  5:51 PM   Specimen: BLOOD  Result Value Ref Range Status   Specimen Description   Final    BLOOD SITE NOT SPECIFIED Performed at [facility] [address], [place], [place] [address]    Special Requests   Final    BOTTLES DRAWN AEROBIC AND ANAEROBIC Blood Culture adequate volume Performed at [facility] [address] [place], [place] [address]    Culture  Setup Time   Final    GRAM POSITIVE RODS AEROBIC BOTTLE ONLY CRITICAL RESULT CALLED TO, READ BACK BY AND VERIFIED WITH: Melodye Ped PHARMD, AT 1011 11/20/21 D. VANHOOK Performed at [facility], [place] [address] [place], [place] [address]    Culture GRAM POSITIVE RODS  Final   Report Status PENDING  Incomplete  Blood culture  (routine x 2)     Status: None (Preliminary result)   Collection Time: 11/19/21  5:53 PM   Specimen: BLOOD  Result Value Ref Range Status   Specimen Description   Final    BLOOD SITE NOT SPECIFIED Performed at [facility] [address], [place], [place] [address]    Special Requests   Final    BOTTLES DRAWN AEROBIC AND ANAEROBIC Blood Culture results may not be optimal due to an excessive volume of blood received in culture bottles Performed at [facility] [address] [place], [place] [address]    Culture   Final    NO GROWTH < 12 HOURS Performed at [facility] [address] [place], [place] [address]    Report Status PENDING  Incomplete    Radiology Studies: No results found.    Lilliah Priego T. [place]  If 7PM-7AM, please contact night-coverage [email or web address] 11/20/2021, 6:23 PM

## 2021-11-20 NOTE — Evaluation (Signed)
Physical Therapy Evaluation Patient Details Name: [name] MRN: [id number] DOB: [birth year] Today's Date: 11/20/2021  History of Present Illness  [name] is a 88 y.o. female admitted with UTI. Pt was admitted from 10/17-10/24 acute encephalopathy and possible UTI. PMH: afib, HTN, OSA, HLD, CVA, atrial flutter  Clinical Impression  Pt admitted with above diagnosis. Pt from home, at baseline walks around home holding to furniture and using AD or holding onto family's arm in community, able to complete self care tasks, family completes household chores and sleeps at pt's house nightly x2 years. Pt has gotten progressively weaker at home this month due to multiple sicknesses; pt needing mod-max A with bed mobility and transfers at eval and only able to take a few sidesteps up to [facility]. Educated pt on time OOB in recliner with meals, to [facility] with nursing as able and pt verbalized understanding. Recommend SNF vs HHPT with family support pending progress; family reports wanting to take pt home but unable to assist pt at current functional status. Pt currently with functional limitations due to the deficits listed below (see PT Problem List). Pt will benefit from skilled PT to increase their independence and safety with mobility to allow discharge to the venue listed below.          Recommendations for follow up therapy are one component of a multi-disciplinary discharge planning process, led by the attending physician.  Recommendations may be updated based on patient status, additional functional criteria and insurance authorization.  Follow Up Recommendations Skilled nursing-short term rehab (<3 hours/day) (vs HHPT pending improvement) Can patient physically be transported by private vehicle: No    Assistance Recommended at Discharge Frequent or constant Supervision/Assistance  Patient can return home with the following  A lot of help with walking and/or transfers;A lot of help with  bathing/dressing/bathroom;Assistance with cooking/housework;Assist for transportation    Equipment Recommendations None recommended by PT  Recommendations for Other Services       Functional Status Assessment Patient has had a recent decline in their functional status and demonstrates the ability to make significant improvements in function in a reasonable and predictable amount of time.     Precautions / Restrictions Precautions Precautions: Fall Restrictions Weight Bearing Restrictions: No      Mobility  Bed Mobility Overal bed mobility: Needs Assistance Bed Mobility: Supine to Sit, Sit to Supine  Supine to sit: Mod assist Sit to supine: Max assist  General bed mobility comments: pt inching BLE to EOB and pulling on bedrail to upright trunk, significantly increased time and increased effort, mod A to upright trunk into sitting; max A to lift BLE back into bed and reposition trunk    Transfers Overall transfer level: Needs assistance Equipment used: Rolling walker (2 wheels) Transfers: Sit to/from Stand Sit to Stand: Mod assist, From elevated surface  General transfer comment: mod A to power up to standing, increased shakiness and posterior lean, cued pt on anterior weight shift    Ambulation/Gait Ambulation/Gait assistance: Mod assist  Assistive device: Rolling walker (2 wheels)  General Gait Details: pt able to take 4 steps to [facility] x2 reps with seated rest break between, increased shakiness, unable to take steps away from bed  Stairs            Wheelchair Mobility    Modified Rankin (Stroke Patients Only)       Balance Overall balance assessment: Needs assistance Sitting-balance support: Feet supported, Bilateral upper extremity supported Sitting balance-Leahy Scale: Poor Sitting balance -  Comments: posterior lean, mod A to CGA to maintain static sitting EOB   Standing balance support: During functional activity, Bilateral upper extremity supported,  Reliant on assistive device for balance Standing balance-Leahy Scale: Poor Standing balance comment: mod A       Pertinent Vitals/Pain Pain Assessment Pain Assessment: No/denies pain    Home Living Family/patient expects to be discharged to:: Private residence Living Arrangements: Alone Available Help at Discharge: Family;Available PRN/intermittently Type of Home: House Home Access: Stairs to enter Entrance Stairs-Rails: None Entrance Stairs-Number of Steps: 1   Home Layout: Two level;Able to live on main level with bedroom/bathroom Home Equipment: Rolling Walker (2 wheels);Cane - single point;Rollator (4 wheels);BSC/3in1;Shower seat Additional Comments: BSC commode frame is over her toilet    Prior Function Prior Level of Function : Independent/Modified Independent  Mobility Comments: pt reports "furniture walking" or using RW in the home, using AD or holding to family in community ADLs Comments: pt reports ind with bathing, dressing, feeding, toileting; pt has family assisting with cleaning and cooking; family sleeps at pt's house nightly for 2  years     Hand Dominance   Dominant Hand: Right    Extremity/Trunk Assessment   Upper Extremity Assessment Upper Extremity Assessment: Defer to OT evaluation    Lower Extremity Assessment Lower Extremity Assessment: Generalized weakness (AROM WFL, strength 3+/5 throughout, denies numbness/tingling)    Cervical / Trunk Assessment Cervical / Trunk Assessment: Normal  Communication   Communication: No difficulties  Cognition Arousal/Alertness: Awake/alert Behavior During Therapy: WFL for tasks assessed/performed Overall Cognitive Status: Within Functional Limits for tasks assessed     General Comments General comments (skin integrity, edema, etc.): Pt on RA with SpO2 95-99% and HR up to 113 with sidestepping at EOB, HR 75 once back supine in bed at EOS    Exercises     Assessment/Plan    PT Assessment Patient needs  continued PT services  PT Problem List Decreased strength;Decreased activity tolerance;Decreased balance;Decreased mobility;Decreased knowledge of use of DME;Decreased safety awareness;Cardiopulmonary status limiting activity       PT Treatment Interventions DME instruction;Gait training;Functional mobility training;Therapeutic activities;Therapeutic exercise;Balance training;Patient/family education;Modalities    PT Goals (Current goals can be found in the Care Plan section)  Acute Rehab PT Goals Patient Stated Goal: regain strength PT Goal Formulation: With patient/family Time For Goal Achievement: 12/04/21 Potential to Achieve Goals: Good    Frequency Min 3X/week     Co-evaluation               AM-PAC PT "6 Clicks" Mobility  Outcome Measure Help needed turning from your back to your side while in a flat bed without using bedrails?: A Lot Help needed moving from lying on your back to sitting on the side of a flat bed without using bedrails?: A Lot Help needed moving to and from a bed to a chair (including a wheelchair)?: A Lot Help needed standing up from a chair using your arms (e.g., wheelchair or bedside chair)?: A Lot Help needed to walk in hospital room?: Total Help needed climbing 3-5 steps with a railing? : Total 6 Click Score: 10    End of Session Equipment Utilized During Treatment: Gait belt Activity Tolerance: Patient tolerated treatment well Patient left: in bed;with call bell/phone within reach;with bed alarm set;with family/visitor present;Other (comment) (lab in room) Nurse Communication: Mobility status PT Visit Diagnosis: Other abnormalities of gait and mobility (R26.89);Muscle weakness (generalized) (M62.81);Unsteadiness on feet (R26.81)    Time: 9449-6759 PT Time Calculation (min) (  ACUTE ONLY): 31 min   Charges:   PT Evaluation $PT Eval Low Complexity: 1 Low PT Treatments $Therapeutic Activity: 8-22 mins         Tori Georgenia Salim PT,  DPT 11/20/21, 10:48 AM

## 2021-11-20 NOTE — NC FL2 (Signed)
[place] LEVEL OF CARE SCREENING TOOL     IDENTIFICATION  Patient Name: [name] Birthdate: 11-Jan-1932 Sex: female Admission Date (Current Location): 11/19/2021  [facility] and [place] Number:  Herbalist and Address:  [facility],  [place], [place]      Provider Number: [id number]  Attending Physician Name and Address:  Mercy Riding, MD  Relative Name and Phone Number:  Rande Lawman (daughter)    Current Level of Care: Hospital Recommended Level of Care: [place] Prior Approval Number:    Date Approved/Denied:   PASRR Number: [id number]  Discharge Plan: SNF    Current Diagnoses: Patient Active Problem List   Diagnosis Date Noted   UTI (urinary tract infection) 11/19/2021   Hyperglycemia 11/19/2021   Lactic acidosis 11/19/2021   History of COVID-19 11/19/2021   Dehydration 11/19/2021   Asymptomatic bacteriuria 11/16/2021   FUO (fever of unknown origin) 11/15/2021   Hypokalemia 11/12/2021   Generalized weakness 11/11/2021   Hypoalbuminemia 11/03/2021   Abnormal LFTs 11/03/2021   COVID-19 virus infection 10/30/2021   Tachycardia-bradycardia syndrome ([place]) 05/19/2021   Obstructive sleep apnea 05/19/2021   Pseudoaneurysm of femoral artery following procedure ([place]) 11/23/2019   CAP (community acquired pneumonia) 11/23/2019   Essential hypertension 11/23/2019   Hyperlipidemia LDL goal <70 11/23/2019   Emphysema of lung ([place]) 11/23/2019   Aortic atherosclerosis ([place]) 11/23/2019   Acute ischemic stroke (HCC)    Pseudoaneurysm (HCC)    Slow transit constipation    Acute ischemic right PCA stroke ([place]) s/p tPA and mechanical thrombectomy, d/t AF not on [facility] 11/14/2019   History of stroke 11/13/2019   Degeneration of lumbar intervertebral disc 02/01/2019   Impacted cerumen of both ears 07/25/2015   Sensorineural hearing loss (SNHL), bilateral 07/25/2015   Hymenoptera allergy 10/05/2014   SVT  (supraventricular tachycardia) 10/20/2012   PAF (paroxysmal atrial fibrillation) (HCC)    Atrial flutter (HCC)     Orientation RESPIRATION BLADDER Height & Weight     Self, Time, Situation, Place  Normal Continent Weight: 68 kg Height:  5' (152.4 cm)  BEHAVIORAL SYMPTOMS/MOOD NEUROLOGICAL BOWEL NUTRITION STATUS      Continent Diet (regular, thin fluids)  AMBULATORY STATUS COMMUNICATION OF NEEDS Skin     Verbally Normal                       Personal Care Assistance Level of Assistance              Functional Limitations Info  Sight, Hearing, Speech Sight Info: Adequate Hearing Info: Adequate Speech Info: Adequate    SPECIAL CARE FACTORS FREQUENCY  PT (By licensed PT), OT (By licensed OT)     PT Frequency: 5x per week OT Frequency: 5x per week            Contractures Contractures Info: Not present    Additional Factors Info  Code Status, Allergies Code Status Info: Full Allergies Info: Bee Venom, Mixed Vespid Venom, Codeine, Sulfa Antibiotics, Erythromycin           Current Medications (11/20/2021):  This is the current hospital active medication list Current Facility-Administered Medications  Medication Dose Route Frequency Provider Last Rate Last Admin   acetaminophen (TYLENOL) tablet 650 mg  650 mg Oral Q6H PRN Gonfa, Taye T, MD       Or   acetaminophen (TYLENOL) suppository 650 mg  650 mg Rectal Q6H PRN Mercy Riding, MD  amLODipine (NORVASC) tablet 5 mg  5 mg Oral Daily Wendee Beavers T, MD   5 mg at 11/20/21 1004   apixaban (ELIQUIS) tablet 5 mg  5 mg Oral BID Wendee Beavers T, MD   5 mg at 11/20/21 1004   cholecalciferol (VITAMIN D3) 25 MCG (1000 UNIT) tablet 2,000 Units  2,000 Units Oral QHS Wendee Beavers T, MD   2,000 Units at 11/19/21 2226   ipratropium-albuterol (DUONEB) 0.5-2.5 (3) MG/3ML nebulizer solution 3 mL  3 mL Nebulization Q6H PRN Suzzanne Cloud, RPH       lactated ringers infusion   Intravenous Continuous Mercy Riding, MD 75 mL/hr at  11/20/21 1016 New Bag at 11/20/21 1016   mirabegron ER (MYRBETRIQ) tablet 25 mg  25 mg Oral Daily Wendee Beavers T, MD   25 mg at 11/20/21 1004   multivitamin with minerals tablet 1 tablet  1 tablet Oral Daily Mercy Riding, MD   1 tablet at 11/20/21 1004   ondansetron (ZOFRAN) tablet 4 mg  4 mg Oral Q6H PRN Mercy Riding, MD       Or   ondansetron (ZOFRAN) injection 4 mg  4 mg Intravenous Q6H PRN Gonfa, Taye T, MD       oxyCODONE (Oxy IR/ROXICODONE) immediate release tablet 5 mg  5 mg Oral Q6H PRN Gonfa, Taye T, MD       phenazopyridine (PYRIDIUM) tablet 100 mg  100 mg Oral TID WC Gonfa, Taye T, MD       piperacillin-tazobactam (ZOSYN) IVPB 3.375 g  3.375 g Intravenous Q12H [place], Abby K, RPH 12.5 mL/hr at 11/20/21 1017 3.375 g at 11/20/21 1017   polyethylene glycol (MIRALAX / GLYCOLAX) packet 17 g  17 g Oral BID PRN Gonfa, Taye T, MD       senna-docusate (Senokot-S) tablet 1 tablet  1 tablet Oral BID PRN Mercy Riding, MD         Discharge Medications: Please see discharge summary for a list of discharge medications.  Relevant Imaging Results:  Relevant Lab Results:   Additional Information SSN: [id number]  Roseanne Kaufman, RN

## 2021-11-21 ENCOUNTER — Encounter (HOSPITAL_COMMUNITY): Payer: Self-pay | Admitting: Student

## 2021-11-21 DIAGNOSIS — N3001 Acute cystitis with hematuria: Secondary | ICD-10-CM | POA: Diagnosis not present

## 2021-11-21 DIAGNOSIS — J432 Centrilobular emphysema: Secondary | ICD-10-CM | POA: Diagnosis not present

## 2021-11-21 DIAGNOSIS — E860 Dehydration: Secondary | ICD-10-CM | POA: Diagnosis not present

## 2021-11-21 DIAGNOSIS — R531 Weakness: Secondary | ICD-10-CM | POA: Diagnosis not present

## 2021-11-21 LAB — CULTURE, BLOOD (ROUTINE X 2): Special Requests: ADEQUATE

## 2021-11-21 LAB — VITAMIN B12: Vitamin B-12: 1280 pg/mL — ABNORMAL HIGH (ref 180–914)

## 2021-11-21 LAB — RENAL FUNCTION PANEL
Albumin: 2.7 g/dL — ABNORMAL LOW (ref 3.5–5.0)
Anion gap: 8 (ref 5–15)
BUN: 11 mg/dL (ref 8–23)
CO2: 25 mmol/L (ref 22–32)
Calcium: 8.1 mg/dL — ABNORMAL LOW (ref 8.9–10.3)
Chloride: 103 mmol/L (ref 98–111)
Creatinine, Ser: 0.78 mg/dL (ref 0.44–1.00)
GFR, Estimated: >60 mL/min (ref >60)
Glucose, Bld: 144 mg/dL — ABNORMAL HIGH (ref 70–99)
Phosphorus: 3.1 mg/dL (ref 2.5–4.6)
Potassium: 4 mmol/L (ref 3.5–5.1)
Sodium: 136 mmol/L (ref 135–145)

## 2021-11-21 LAB — CBC
HCT: 36.5 % (ref 36.0–46.0)
Hemoglobin: 12.1 g/dL (ref 12.0–15.0)
MCH: 32.5 pg (ref 26.0–34.0)
MCHC: 33.2 g/dL (ref 30.0–36.0)
MCV: 98.1 fL (ref 80.0–100.0)
Platelets: 298 10*3/uL (ref 150–400)
RBC: 3.72 MIL/uL — ABNORMAL LOW (ref 3.87–5.11)
RDW: 13 % (ref 11.5–15.5)
WBC: 9.3 10*3/uL (ref 4.0–10.5)
nRBC: 0 % (ref 0.0–0.2)

## 2021-11-21 LAB — PROCALCITONIN: Procalcitonin: <0.1 ng/mL

## 2021-11-21 LAB — MAGNESIUM: Magnesium: 2.6 mg/dL — ABNORMAL HIGH (ref 1.7–2.4)

## 2021-11-21 MED ORDER — PIPERACILLIN-TAZOBACTAM 3.375 G IVPB
3.3750 g | Freq: Three times a day (TID) | INTRAVENOUS | Status: AC
  Administered 2021-11-21 – 2021-11-23 (×6): 3.375 g via INTRAVENOUS
  Filled 2021-11-21 (×6): qty 50

## 2021-11-21 NOTE — Progress Notes (Signed)
PROGRESS NOTE  [name] LOV:[id number] DOB: March 17, 1931   PCP: Crist Infante, MD  Patient is from: Home.  DOA: 11/19/2021 LOS: 1  Chief complaints Chief Complaint  Patient presents with   Weakness    Recent DX of UTI states she believes she may still have UTI despite currently being on ABX      Brief Narrative / Interim history: 88 y.o. female with PMH of PAF on Eliquis, OSA on CPAP, CVA, MCI, HTN, HLD, recent COVID-19 infection, recurrent UTI on prophylactic antibiotics and recent hospitalization for encephalopathy and possible UTI returning with generalized weakness, "fever", chills, nausea and dysuria, and admitted for possible UTI, dehydration, generalized weakness and failure to thrive.  Started on IV fluid and IV Zosyn.  Per family, very independent and active until recent hospitalization for COVID-19.  However, some decline in cognitive function since she had stroke in 2021.  Blood culture with GPR in 1 out of 2 aerobic bottles.  Culture with multiple species.  Dysuria resolved after starting Pyridium.  Therapy recommended SNF.  Subjective: Seen and examined earlier this morning.  No major events overnight of this morning.  She says she feels better.  Weakness has improved.  Dysuria resolved.  Denies chest pain, dyspnea or abdominal pain.  Patient's daughter at bedside.  Objective: Vitals:   11/20/21 1731 11/20/21 2020 11/21/21 0520 11/21/21 1215  BP: 139/65 132/65 (!) 140/71 (!) 131/52  Pulse: 83 85 77 72  Resp: '20 20 20 17  '$ Temp: 99.5 F (37.5 C) 99 F (37.2 C) 99.3 F (37.4 C) 99.5 F (37.5 C)  TempSrc: Oral Oral  Oral  SpO2: 93% 93% 92% 92%  Weight:      Height:        Examination:  GENERAL: No apparent distress.  Nontoxic. HEENT: MMM.  Vision and hearing grossly intact.  NECK: Supple.  No apparent JVD.  RESP:  No IWOB.  Fair aeration bilaterally. CVS:  RRR. Heart sounds normal.  ABD/GI/GU: BS+. Abd soft, NTND.  MSK/EXT:  Moves extremities. No apparent  deformity. No edema.  SKIN: no apparent skin lesion or wound NEURO: Awake and alert. Oriented appropriately.  No apparent focal neuro deficit. PSYCH: Appears anxious.  Procedures:  None  Microbiology summarized: Urine culture with multiple species Blood culture with GPR in 1 out of 2 aerobic bottles  Assessment and plan: Principal Problem:   UTI (urinary tract infection) Active Problems:   PAF (paroxysmal atrial fibrillation) (HCC)   Essential hypertension   Emphysema of lung (HCC)   Obstructive sleep apnea   Generalized weakness   Hyperglycemia   Lactic acidosis   History of COVID-19   Dehydration   Failure to thrive in adult   Cognitive decline  Urinary tract infection: Continues to have mild temp despite antibiotics.  UA with bacteriuria, moderate Hgb, trace LE and negative nitrite.  Urine culture with multiple species.  GPR in blood likely contaminant.  Pro-Cal negative.  Hemodynamically stable.  Dysuria resolved with Pyridium.  Feels better.  -Complete 3 days of IV Zosyn -Discontinue IV fluid -Continue Pyridium for total of 2 days -Encouraged to follow-up with a urologist after discharge.   Generalized weakness/failure to thrive: Family noted some cognitive decline and she had a stroke 2 years ago.  However, she was physically active until recent hospitalization for COVID earlier this month.  She has been deconditioned since then.  She might have progressive dementia as a result of COVID.  No focal neurodeficit.  TSH within normal. -Treat  treatable causes -PT/OT-recommended SNF. -OOB for meals  Dehydration: as evidenced by lactic acidosis and hemoconcentration.  Resolved. -Discontinue IV fluid   Lactic acidosis: Resolved.   Paroxysmal A-fib: Seems to be in sinus rhythm now.  Not on rate or rhythm control medication.  She has history of tachybradycardia syndrome.  However, her heart monitor in 05/2021 showed 100% A-fib -Continue home Eliquis   Emphysema: Stable.  No  respiratory symptoms -Continue as needed DuoNeb   Obstructive sleep apnea on CPAP -Nightly CPAP   History of COVID: Tested positive on 10/29/2021 -No isolation precaution.    "Squamous cell carcinoma" of the skin: Just had resection the day prior to presentation.  Has clear looking dressing over her nose. -Outpatient follow-up  Body mass index is 29.29 kg/m.           DVT prophylaxis:   apixaban (ELIQUIS) tablet 5 mg  Code Status: Full code Family Communication: Updated patient's daughter at the bedside and another daughter over the phone Level of care: Med-Surg Status is: Inpatient Remains inpatient appropriate because: Due to UTI, failure to thrive and physical deconditioning   Final disposition: SNF Consultants:  None  Sch Meds:  Scheduled Meds:  amLODipine  5 mg Oral Daily   apixaban  5 mg Oral BID   cholecalciferol  2,000 Units Oral QHS   mirabegron ER  25 mg Oral Daily   multivitamin with minerals  1 tablet Oral Daily   phenazopyridine  100 mg Oral TID WC   Continuous Infusions:  piperacillin-tazobactam (ZOSYN)  IV 3.375 g (11/21/21 0925)   PRN Meds:.acetaminophen **OR** acetaminophen, ipratropium-albuterol, ondansetron **OR** ondansetron (ZOFRAN) IV, polyethylene glycol, senna-docusate  Antimicrobials: Anti-infectives (From admission, onward)    Start     Dose/Rate Route Frequency Ordered Stop   11/21/21 1000  piperacillin-tazobactam (ZOSYN) IVPB 3.375 g        3.375 g 12.5 mL/hr over 240 Minutes Intravenous Every 8 hours 11/21/21 0830 11/23/21 0959   11/19/21 1815  piperacillin-tazobactam (ZOSYN) IVPB 3.375 g  Status:  Discontinued        3.375 g 12.5 mL/hr over 240 Minutes Intravenous Every 12 hours 11/19/21 1802 11/21/21 0830   11/19/21 1715  vancomycin (VANCOCIN) IVPB 1000 mg/200 mL premix  Status:  Discontinued        1,000 mg 200 mL/hr over 60 Minutes Intravenous  Once 11/19/21 1704 11/19/21 1738        I have personally reviewed the  following labs and images: CBC: Recent Labs  Lab 11/16/21 0453 11/17/21 0510 11/19/21 1351 11/20/21 0422 11/21/21 0439  WBC 15.7* 17.7* 11.2* 8.9 9.3  NEUTROABS  --  15.3* 8.1*  --   --   HGB 11.6* 11.3* 12.5 12.1 12.1  HCT 34.8* 34.0* 37.2 36.0 36.5  MCV 98.3 99.1 99.5 98.6 98.1  PLT 221 278 350 308 298   BMP &GFR Recent Labs  Lab 11/15/21 0542 11/16/21 0453 11/19/21 1351 11/20/21 0422 11/21/21 0439  NA 139 139 136 137 136  K 3.7 3.8 3.9 3.9 4.0  CL 104 103 102 103 103  CO2 '28 25 25 25 25  '$ GLUCOSE 193* 212* 146* 135* 144*  BUN '22 23 16 13 11  '$ CREATININE 0.80 0.76 0.84 0.81 0.78  CALCIUM 8.6* 8.7* 8.1* 8.1* 8.1*  MG 2.5* 2.3  --  2.3 2.6*  PHOS  --   --   --  3.5 3.1   Estimated Creatinine Clearance: 41.8 mL/min (by C-G formula based on SCr of 0.78 mg/dL).  Liver & Pancreas: Recent Labs  Lab 11/19/21 1351 11/20/21 0422 11/21/21 0439  AST 33 23  --   ALT 118* 84*  --   ALKPHOS 55 48  --   BILITOT 1.1 0.9  --   PROT 6.1* 5.6*  --   ALBUMIN 2.9* 2.6* 2.7*   No results for input(s): "LIPASE", "AMYLASE" in the last 168 hours. Recent Labs  Lab 11/20/21 0422  AMMONIA 26   Diabetic: Recent Labs    11/20/21 0422  HGBA1C 6.4*   Recent Labs  Lab 11/19/21 1358  GLUCAP 146*   Cardiac Enzymes: Recent Labs  Lab 11/20/21 0422  CKTOTAL 25*   No results for input(s): "PROBNP" in the last 8760 hours. Coagulation Profile: No results for input(s): "INR", "PROTIME" in the last 168 hours. Thyroid Function Tests: Recent Labs    11/20/21 0422  TSH 2.638   Lipid Profile: No results for input(s): "CHOL", "HDL", "LDLCALC", "TRIG", "CHOLHDL", "LDLDIRECT" in the last 72 hours. Anemia Panel: Recent Labs    11/21/21 0439  VITAMINB12 1,280*   Urine analysis:    Component Value Date/Time   COLORURINE STRAW (A) 11/19/2021 1349   APPEARANCEUR CLOUDY (A) 11/19/2021 1349   LABSPEC 1.003 (L) 11/19/2021 1349   PHURINE 8.0 11/19/2021 1349   GLUCOSEU NEGATIVE  11/19/2021 1349   HGBUR MODERATE (A) 11/19/2021 1349   BILIRUBINUR NEGATIVE 11/19/2021 1349   KETONESUR NEGATIVE 11/19/2021 1349   PROTEINUR NEGATIVE 11/19/2021 1349   NITRITE NEGATIVE 11/19/2021 1349   LEUKOCYTESUR SMALL (A) 11/19/2021 1349   Sepsis Labs: Invalid input(s): "PROCALCITONIN", "LACTICIDVEN"  Microbiology: Recent Results (from the past 240 hour(s))  Respiratory (~20 pathogens) panel by PCR     Status: None   Collection Time: 11/14/21 10:21 AM   Specimen: Nasopharyngeal Swab; Respiratory  Result Value Ref Range Status   Adenovirus NOT DETECTED NOT DETECTED Final   Coronavirus 229E NOT DETECTED NOT DETECTED Final    Comment: (NOTE) The Coronavirus on the Respiratory Panel, DOES NOT test for the novel  Coronavirus (2019 nCoV)    Coronavirus HKU1 NOT DETECTED NOT DETECTED Final   Coronavirus NL63 NOT DETECTED NOT DETECTED Final   Coronavirus OC43 NOT DETECTED NOT DETECTED Final   Metapneumovirus NOT DETECTED NOT DETECTED Final   Rhinovirus / Enterovirus NOT DETECTED NOT DETECTED Final   Influenza A NOT DETECTED NOT DETECTED Final   Influenza B NOT DETECTED NOT DETECTED Final   Parainfluenza Virus 1 NOT DETECTED NOT DETECTED Final   Parainfluenza Virus 2 NOT DETECTED NOT DETECTED Final   Parainfluenza Virus 3 NOT DETECTED NOT DETECTED Final   Parainfluenza Virus 4 NOT DETECTED NOT DETECTED Final   Respiratory Syncytial Virus NOT DETECTED NOT DETECTED Final   Bordetella pertussis NOT DETECTED NOT DETECTED Final   Bordetella Parapertussis NOT DETECTED NOT DETECTED Final   Chlamydophila pneumoniae NOT DETECTED NOT DETECTED Final   Mycoplasma pneumoniae NOT DETECTED NOT DETECTED Final    Comment: Performed at [facility], [place]. [address], [place], [place] [address]  Culture, blood (Routine X 2) w Reflex to ID Panel     Status: None   Collection Time: 11/14/21 11:19 AM   Specimen: BLOOD  Result Value Ref Range Status   Specimen Description   Final    BLOOD RIGHT  ANTECUBITAL Performed at [place] [address], [place], [place] [address]    Special Requests   Final    BOTTLES DRAWN AEROBIC ONLY Blood Culture adequate volume Performed at [facility]  [address], [place], [place] [address]    Culture   Final    NO GROWTH 5 DAYS Performed at [facility], [place] [address], [place], [place] [address]    Report Status 11/19/2021 FINAL  Final  Culture, blood (Routine X 2) w Reflex to ID Panel     Status: None   Collection Time: 11/14/21 11:19 AM   Specimen: BLOOD  Result Value Ref Range Status   Specimen Description   Final    BLOOD BLOOD RIGHT HAND Performed at [place] [address] [place], [place] [address]    Special Requests   Final    BOTTLES DRAWN AEROBIC ONLY Blood Culture adequate volume Performed at [facility] [address], [place], [place] [address]    Culture   Final    NO GROWTH 5 DAYS Performed at [facility], [place] [address], [place], [place] [address]    Report Status 11/19/2021 FINAL  Final  Urine Culture     Status: Abnormal   Collection Time: 11/19/21  3:02 PM   Specimen: Urine, Clean Catch  Result Value Ref Range Status   Specimen Description   Final    URINE, CLEAN CATCH Performed at [facility], [place] [address] [place], [place] [address]    Special Requests   Final    NONE Performed at [facility], [place] [address] [place], [place] [address]    Culture MULTIPLE SPECIES PRESENT, SUGGEST RECOLLECTION (A)  Final   Report Status 11/20/2021 FINAL  Final  Blood culture (routine x 2)     Status: Abnormal   Collection Time: 11/19/21  5:51 PM   Specimen: BLOOD  Result Value Ref Range Status   Specimen Description   Final    BLOOD SITE NOT SPECIFIED Performed at [place] [address] [place], [place] [address]    Special Requests   Final     BOTTLES DRAWN AEROBIC AND ANAEROBIC Blood Culture adequate volume Performed at [place] [address] [place], [place] [address]    Culture  Setup Time   Final    GRAM POSITIVE RODS AEROBIC BOTTLE ONLY CRITICAL RESULT CALLED TO, READ BACK BY AND VERIFIED WITH: Melodye Ped PHARMD, AT 1011 11/20/21 D. VANHOOK    Culture (A)  Final    BACILLUS SPECIES Standardized susceptibility testing for this organism is not available. Performed at [facility], [place] [address] [place], [place] [address]    Report Status 11/21/2021 FINAL  Final  Blood culture (routine x 2)     Status: None (Preliminary result)   Collection Time: 11/19/21  5:53 PM   Specimen: BLOOD  Result Value Ref Range Status   Specimen Description   Final    BLOOD SITE NOT SPECIFIED Performed at [facility] [address], [place], [place] [address]    Special Requests   Final    BOTTLES DRAWN AEROBIC AND ANAEROBIC Blood Culture results may not be optimal due to an excessive volume of blood received in culture bottles Performed at [facility] [address], [place], [place] [address]    Culture   Final    NO GROWTH 2 DAYS Performed at [place] [address], [place],  [address]    Report Status PENDING  Incomplete    Radiology Studies: No results found.    Callista Hoh T.  [place]  If 7PM-7AM, please contact night-coverage [email or web address] 11/21/2021, 3:27 PM

## 2021-11-22 DIAGNOSIS — E860 Dehydration: Secondary | ICD-10-CM | POA: Diagnosis not present

## 2021-11-22 DIAGNOSIS — R531 Weakness: Secondary | ICD-10-CM | POA: Diagnosis not present

## 2021-11-22 DIAGNOSIS — N3001 Acute cystitis with hematuria: Secondary | ICD-10-CM | POA: Diagnosis not present

## 2021-11-22 DIAGNOSIS — J432 Centrilobular emphysema: Secondary | ICD-10-CM | POA: Diagnosis not present

## 2021-11-22 LAB — PROCALCITONIN: Procalcitonin: <0.1 ng/mL

## 2021-11-22 MED ORDER — MIRTAZAPINE 15 MG PO TABS
7.5000 mg | ORAL_TABLET | Freq: Every day | ORAL | Status: DC
  Administered 2021-11-22 – 2021-11-27 (×6): 7.5 mg via ORAL
  Filled 2021-11-22 (×6): qty 1

## 2021-11-22 NOTE — Progress Notes (Signed)
PROGRESS NOTE  [name] MVH:[id number] DOB: [birth year]   PCP: Crist Infante, MD  Patient is from: Home.  DOA: 11/19/2021 LOS: 2  Chief complaints Chief Complaint  Patient presents with   Weakness    Recent DX of UTI states she believes she may still have UTI despite currently being on ABX      Brief Narrative / Interim history: 88 y.o. female with PMH of PAF on Eliquis, OSA on CPAP, CVA, MCI, HTN, HLD, recent COVID-19 infection, recurrent UTI on prophylactic antibiotics and recent hospitalization for encephalopathy and possible UTI returning with generalized weakness, "fever", chills, nausea and dysuria, and admitted for possible UTI, dehydration, generalized weakness and failure to thrive.  Started on IV fluid and IV Zosyn.  Per family, very independent and active until recent hospitalization for COVID-19.  However, some decline in cognitive function since she had stroke in 2021.  Blood culture with GPR in 1 out of 2 aerobic bottles.  Culture with multiple species.  Dysuria resolved after starting Pyridium.  Therapy recommended SNF.  Subjective: Seen and examined earlier this morning.  Patient's daughter and son at bedside.  She states she is falling apart.  Reports dysuria despite ongoing IV antibiotics and Pyridium although her daughter and did not notice this morning.  She is anxious.  She admits to this but denies feeling down or being depressed.  Willing to try something for anxiety.   Objective: Vitals:   11/21/21 1215 11/21/21 2048 11/22/21 0524 11/22/21 1155  BP: (!) 131/52 134/68 137/82 126/75  Pulse: 72 77 82 76  Resp: '17 20 18 18  '$ Temp: 99.5 F (37.5 C) 99.8 F (37.7 C) 99.6 F (37.6 C) 99 F (37.2 C)  TempSrc: Oral Oral Oral Oral  SpO2: 92% 92% 93% 94%  Weight:      Height:        Examination:  GENERAL: No apparent distress.  Nontoxic. HEENT: MMM.  Vision and hearing grossly intact.  NECK: Supple.  No apparent JVD.  RESP:  No IWOB.  Fair aeration  bilaterally. CVS:  RRR. Heart sounds normal.  ABD/GI/GU: BS+. Abd soft, NTND.  MSK/EXT:  Moves extremities. No apparent deformity. No edema.  SKIN: Stitches over her nose appears clean NEURO: Awake and alert. Oriented appropriately.  No apparent focal neuro deficit. PSYCH: Appears anxious.  Procedures:  None  Microbiology summarized: Urine culture with multiple species Blood culture with GPR in 1 out of 2 aerobic bottles  Assessment and plan: Principal Problem:   UTI (urinary tract infection) Active Problems:   PAF (paroxysmal atrial fibrillation) (HCC)   Essential hypertension   Emphysema of lung (HCC)   Obstructive sleep apnea   Generalized weakness   Hyperglycemia   Lactic acidosis   History of COVID-19   Dehydration   Failure to thrive in adult   Cognitive decline  Urinary tract infection: Continues to have mild temp despite antibiotics.  UA with bacteriuria, moderate Hgb, trace LE and negative nitrite.  Urine culture with multiple species.  GPR in blood likely contaminant.  Pro-Cal negative.  Hemodynamically stable.  Dysuria resolved with Pyridium.  Feels better.  -Completes 3 days of IV Zosyn today. -Continue Pyridium for total of 2 days -Encouraged to follow-up with a urologist after discharge.   Generalized weakness/failure to thrive: Family noted some cognitive decline and she had a stroke 2 years ago.  However, she was physically active until recent hospitalization for COVID earlier this month.  She has been deconditioned since then.  She might have progressive dementia as a result of COVID.  No focal neurodeficit.  TSH within normal. -Treat treatable causes -PT/OT-recommended SNF. -OOB for meals  Dehydration: as evidenced by lactic acidosis and hemoconcentration.  Resolved.   Lactic acidosis: Resolved.   Paroxysmal A-fib: Seems to be in sinus rhythm now.  Not on rate or rhythm control medication.  She has history of tachybradycardia syndrome.  However, her  heart monitor in 05/2021 showed 100% A-fib -Continue home Eliquis   Emphysema: Stable.  No respiratory symptoms -Continue as needed DuoNeb   Obstructive sleep apnea on CPAP -Nightly CPAP   History of COVID: Tested positive on 10/29/2021 -No isolation precaution.   "Squamous cell carcinoma" of the skin: Just had resection the day prior to presentation.  Has clear looking dressing over her nose. -Outpatient follow-up  Anxiety state -Start low-dose Remeron.  Body mass index is 29.29 kg/m.           DVT prophylaxis:   apixaban (ELIQUIS) tablet 5 mg  Code Status: Full code Family Communication: Updated son and 1 daughter at bedside and the other daughter over the phone Level of care: Med-Surg Status is: Inpatient Remains inpatient appropriate because: Due to UTI, failure to thrive and physical deconditioning   Final disposition: SNF Consultants:  None  Sch Meds:  Scheduled Meds:  amLODipine  5 mg Oral Daily   apixaban  5 mg Oral BID   cholecalciferol  2,000 Units Oral QHS   mirabegron ER  25 mg Oral Daily   mirtazapine  7.5 mg Oral QHS   multivitamin with minerals  1 tablet Oral Daily   phenazopyridine  100 mg Oral TID WC   Continuous Infusions:  piperacillin-tazobactam (ZOSYN)  IV 3.375 g (11/22/21 1032)   PRN Meds:.acetaminophen **OR** acetaminophen, ipratropium-albuterol, ondansetron **OR** ondansetron (ZOFRAN) IV, polyethylene glycol, senna-docusate  Antimicrobials: Anti-infectives (From admission, onward)    Start     Dose/Rate Route Frequency Ordered Stop   11/21/21 1000  piperacillin-tazobactam (ZOSYN) IVPB 3.375 g        3.375 g 12.5 mL/hr over 240 Minutes Intravenous Every 8 hours 11/21/21 0830 11/23/21 0959   11/19/21 1815  piperacillin-tazobactam (ZOSYN) IVPB 3.375 g  Status:  Discontinued        3.375 g 12.5 mL/hr over 240 Minutes Intravenous Every 12 hours 11/19/21 1802 11/21/21 0830   11/19/21 1715  vancomycin (VANCOCIN) IVPB 1000 mg/200 mL  premix  Status:  Discontinued        1,000 mg 200 mL/hr over 60 Minutes Intravenous  Once 11/19/21 1704 11/19/21 1738        I have personally reviewed the following labs and images: CBC: Recent Labs  Lab 11/16/21 0453 11/17/21 0510 11/19/21 1351 11/20/21 0422 11/21/21 0439  WBC 15.7* 17.7* 11.2* 8.9 9.3  NEUTROABS  --  15.3* 8.1*  --   --   HGB 11.6* 11.3* 12.5 12.1 12.1  HCT 34.8* 34.0* 37.2 36.0 36.5  MCV 98.3 99.1 99.5 98.6 98.1  PLT 221 278 350 308 298   BMP &GFR Recent Labs  Lab 11/16/21 0453 11/19/21 1351 11/20/21 0422 11/21/21 0439  NA 139 136 137 136  K 3.8 3.9 3.9 4.0  CL 103 102 103 103  CO2 '25 25 25 25  '$ GLUCOSE 212* 146* 135* 144*  BUN '23 16 13 11  '$ CREATININE 0.76 0.84 0.81 0.78  CALCIUM 8.7* 8.1* 8.1* 8.1*  MG 2.3  --  2.3 2.6*  PHOS  --   --  3.5  3.1   Estimated Creatinine Clearance: 41.8 mL/min (by C-G formula based on SCr of 0.78 mg/dL). Liver & Pancreas: Recent Labs  Lab 11/19/21 1351 11/20/21 0422 11/21/21 0439  AST 33 23  --   ALT 118* 84*  --   ALKPHOS 55 48  --   BILITOT 1.1 0.9  --   PROT 6.1* 5.6*  --   ALBUMIN 2.9* 2.6* 2.7*   No results for input(s): "LIPASE", "AMYLASE" in the last 168 hours. Recent Labs  Lab 11/20/21 0422  AMMONIA 26   Diabetic: Recent Labs    11/20/21 0422  HGBA1C 6.4*   Recent Labs  Lab 11/19/21 1358  GLUCAP 146*   Cardiac Enzymes: Recent Labs  Lab 11/20/21 0422  CKTOTAL 25*   No results for input(s): "PROBNP" in the last 8760 hours. Coagulation Profile: No results for input(s): "INR", "PROTIME" in the last 168 hours. Thyroid Function Tests: Recent Labs    11/20/21 0422  TSH 2.638   Lipid Profile: No results for input(s): "CHOL", "HDL", "LDLCALC", "TRIG", "CHOLHDL", "LDLDIRECT" in the last 72 hours. Anemia Panel: Recent Labs    11/21/21 0439  VITAMINB12 1,280*   Urine analysis:    Component Value Date/Time   COLORURINE STRAW (A) 11/19/2021 1349   APPEARANCEUR CLOUDY (A)  11/19/2021 1349   LABSPEC 1.003 (L) 11/19/2021 1349   PHURINE 8.0 11/19/2021 1349   GLUCOSEU NEGATIVE 11/19/2021 1349   HGBUR MODERATE (A) 11/19/2021 1349   BILIRUBINUR NEGATIVE 11/19/2021 1349   KETONESUR NEGATIVE 11/19/2021 1349   PROTEINUR NEGATIVE 11/19/2021 1349   NITRITE NEGATIVE 11/19/2021 1349   LEUKOCYTESUR SMALL (A) 11/19/2021 1349   Sepsis Labs: Invalid input(s): "PROCALCITONIN", "LACTICIDVEN"  Microbiology: Recent Results (from the past 240 hour(s))  Respiratory (~20 pathogens) panel by PCR     Status: None   Collection Time: 11/14/21 10:21 AM   Specimen: Nasopharyngeal Swab; Respiratory  Result Value Ref Range Status   Adenovirus NOT DETECTED NOT DETECTED Final   Coronavirus 229E NOT DETECTED NOT DETECTED Final    Comment: (NOTE) The Coronavirus on the Respiratory Panel, DOES NOT test for the novel  Coronavirus (2019 nCoV)    Coronavirus HKU1 NOT DETECTED NOT DETECTED Final   Coronavirus NL63 NOT DETECTED NOT DETECTED Final   Coronavirus OC43 NOT DETECTED NOT DETECTED Final   Metapneumovirus NOT DETECTED NOT DETECTED Final   Rhinovirus / Enterovirus NOT DETECTED NOT DETECTED Final   Influenza A NOT DETECTED NOT DETECTED Final   Influenza B NOT DETECTED NOT DETECTED Final   Parainfluenza Virus 1 NOT DETECTED NOT DETECTED Final   Parainfluenza Virus 2 NOT DETECTED NOT DETECTED Final   Parainfluenza Virus 3 NOT DETECTED NOT DETECTED Final   Parainfluenza Virus 4 NOT DETECTED NOT DETECTED Final   Respiratory Syncytial Virus NOT DETECTED NOT DETECTED Final   Bordetella pertussis NOT DETECTED NOT DETECTED Final   Bordetella Parapertussis NOT DETECTED NOT DETECTED Final   Chlamydophila pneumoniae NOT DETECTED NOT DETECTED Final   Mycoplasma pneumoniae NOT DETECTED NOT DETECTED Final    Comment: Performed at [facility], [place]. [address] [place], [place] [address]  Culture, blood (Routine X 2) w Reflex to ID Panel     Status: None   Collection Time: 11/14/21  11:19 AM   Specimen: BLOOD  Result Value Ref Range Status   Specimen Description   Final    BLOOD RIGHT ANTECUBITAL Performed at [facility] [address], [place], [place] [address]    Special Requests  Final    BOTTLES DRAWN AEROBIC ONLY Blood Culture adequate volume Performed at [facility] [address], [place], [place] [address]    Culture   Final    NO GROWTH 5 DAYS Performed at [facility], [place] [address], [place], [place] [address]    Report Status 11/19/2021 FINAL  Final  Culture, blood (Routine X 2) w Reflex to ID Panel     Status: None   Collection Time: 11/14/21 11:19 AM   Specimen: BLOOD  Result Value Ref Range Status   Specimen Description   Final    BLOOD BLOOD RIGHT HAND Performed at [facility] [address], [place], [place] [address]    Special Requests   Final    BOTTLES DRAWN AEROBIC ONLY Blood Culture adequate volume Performed at [facility] [address], [place], [place] [address]    Culture   Final    NO GROWTH 5 DAYS Performed at [facility], [place] [address] [place], [place] [address]    Report Status 11/19/2021 FINAL  Final  Urine Culture     Status: Abnormal   Collection Time: 11/19/21  3:02 PM   Specimen: Urine, Clean Catch  Result Value Ref Range Status   Specimen Description   Final    URINE, CLEAN CATCH Performed at [facility], [place] [address] [place], [place] [address]    Special Requests   Final    NONE Performed at [facility], [place] [address] [place], [place] [address]    Culture MULTIPLE SPECIES PRESENT, SUGGEST RECOLLECTION (A)  Final   Report Status 11/20/2021 FINAL  Final  Blood culture (routine x 2)     Status: Abnormal   Collection Time: 11/19/21  5:51 PM   Specimen: BLOOD  Result Value Ref Range Status   Specimen Description   Final    BLOOD SITE NOT SPECIFIED Performed at [facility] [address] [place], [place] [address]    Special Requests   Final    BOTTLES DRAWN AEROBIC AND ANAEROBIC Blood Culture adequate volume Performed at [facility] [address], [place], [place] [address]    Culture  Setup Time   Final    GRAM POSITIVE RODS AEROBIC BOTTLE ONLY CRITICAL RESULT CALLED TO, READ BACK BY AND VERIFIED WITH: Melodye Ped PHARMD, AT 1011 11/20/21 D. VANHOOK    Culture (A)  Final    BACILLUS SPECIES Standardized susceptibility testing for this organism is not available. Performed at [facility], [place] [address], [place], [place] [address]    Report Status 11/21/2021 FINAL  Final  Blood culture (routine x 2)     Status: None (Preliminary result)   Collection Time: 11/19/21  5:53 PM   Specimen: BLOOD  Result Value Ref Range Status   Specimen Description   Final    BLOOD SITE NOT SPECIFIED Performed at [facility] [address], [place], [place] [address]    Special Requests   Final    BOTTLES DRAWN AEROBIC AND ANAEROBIC Blood Culture results may not be optimal due to an excessive volume of blood received in culture bottles Performed at [facility] [address], [place], [place] [address]    Culture   Final    NO GROWTH 2 DAYS Performed at [facility] [address] [place],  [address]    Report Status PENDING  Incomplete    Radiology Studies: No results found.    Deangela Randleman T. [place]  If 7PM-7AM, please contact night-coverage [email or web address] 11/22/2021, 12:40 PM

## 2021-11-23 DIAGNOSIS — E860 Dehydration: Secondary | ICD-10-CM | POA: Diagnosis not present

## 2021-11-23 DIAGNOSIS — N3001 Acute cystitis with hematuria: Secondary | ICD-10-CM | POA: Diagnosis not present

## 2021-11-23 DIAGNOSIS — R531 Weakness: Secondary | ICD-10-CM | POA: Diagnosis not present

## 2021-11-23 DIAGNOSIS — J432 Centrilobular emphysema: Secondary | ICD-10-CM | POA: Diagnosis not present

## 2021-11-23 MED ORDER — ACETAMINOPHEN 650 MG RE SUPP
650.0000 mg | Freq: Four times a day (QID) | RECTAL | Status: DC | PRN
  Filled 2021-11-23 (×2): qty 1

## 2021-11-23 MED ORDER — ACETAMINOPHEN 325 MG PO TABS
650.0000 mg | ORAL_TABLET | Freq: Four times a day (QID) | ORAL | Status: DC | PRN
  Administered 2021-11-25: 650 mg via ORAL
  Filled 2021-11-23: qty 2

## 2021-11-23 NOTE — Progress Notes (Signed)
Physical Therapy Treatment Patient Details Name: [name] MRN: [id number] DOB: 1931-03-23 Today's Date: 11/23/2021   History of Present Illness 88 y.o. female with PMH of paroxysmal A-fib on Eliquis, OSA on CPAP, CVA, HTN, HLD, recent COVID-19 infection, recurrent UTI on prophylactic antibiotics. Admitted with UTI, general weakness, FTT.    PT Comments    Pt reports being fatigued from a busy morning. She was eventually agreeable to working on sit to stand transitions and standing balance with the RW. Pt only able to stand for ~ 15 seconds each time before requesting to sit down. 1 minute rest breaks taken between standing trials. Plan is for ST SNF rehab.     Recommendations for follow up therapy are one component of a multi-disciplinary discharge planning process, led by the attending physician.  Recommendations may be updated based on patient status, additional functional criteria and insurance authorization.  Follow Up Recommendations  Skilled nursing-short term rehab (<3 hours/day) Can patient physically be transported by private vehicle: No   Assistance Recommended at Discharge Frequent or constant Supervision/Assistance  Patient can return home with the following A lot of help with walking and/or transfers;A lot of help with bathing/dressing/bathroom;Assist for transportation;Assistance with cooking/housework;Help with stairs or ramp for entrance   Equipment Recommendations  None recommended by PT    Recommendations for Other Services       Precautions / Restrictions Precautions Precautions: Fall Precaution Comments: monitor o2 sats; urinary incontinence at baseline Restrictions Weight Bearing Restrictions: No     Mobility  Bed Mobility               General bed mobility comments: oob in recliner    Transfers Overall transfer level: Needs assistance Equipment used: Rolling walker (2 wheels) Transfers: Sit to/from Stand Sit to Stand: Min assist, +2  physical assistance, +2 safety/equipment           General transfer comment: Sit to stand x 3-pt able to stand for ~15 seconds each time. Cues for safety, technique, hand placement. Encouragement required. Pt expressed being fatigued from a busy morning    Ambulation/Gait               General Gait Details: NT on today 2* fatigue   Stairs             Wheelchair Mobility    Modified Rankin (Stroke Patients Only)       Balance Overall balance assessment: Needs assistance         Standing balance support: During functional activity, Bilateral upper extremity supported, Reliant on assistive device for balance Standing balance-Leahy Scale: Poor                              Cognition Arousal/Alertness: Awake/alert Behavior During Therapy: WFL for tasks assessed/performed, Flat affect Overall Cognitive Status: Impaired/Different from baseline Area of Impairment: Orientation, Following commands                 Orientation Level: Disoriented to, Time     Following Commands: Follows one step commands with increased time                Exercises      General Comments        Pertinent Vitals/Pain Pain Assessment Pain Assessment: No/denies pain    Home Living  Prior Function            PT Goals (current goals can now be found in the care plan section) Progress towards PT goals: Progressing toward goals    Frequency    Min 3X/week      PT Plan Current plan remains appropriate    Co-evaluation              AM-PAC PT "6 Clicks" Mobility   Outcome Measure  Help needed turning from your back to your side while in a flat bed without using bedrails?: A Lot Help needed moving from lying on your back to sitting on the side of a flat bed without using bedrails?: A Lot Help needed moving to and from a bed to a chair (including a wheelchair)?: A Lot Help needed standing up from a  chair using your arms (e.g., wheelchair or bedside chair)?: A Lot Help needed to walk in hospital room?: A Lot Help needed climbing 3-5 steps with a railing? : Total 6 Click Score: 11    End of Session Equipment Utilized During Treatment: Gait belt Activity Tolerance: Patient limited by fatigue;Patient tolerated treatment well Patient left: in chair;with call bell/phone within reach;with chair alarm set;with family/visitor present   PT Visit Diagnosis: Difficulty in walking, not elsewhere classified (R26.2);Muscle weakness (generalized) (M62.81)     Time: 0383-3383 PT Time Calculation (min) (ACUTE ONLY): 10 min  Charges:  $Therapeutic Activity: 8-22 mins                        Doreatha Massed, PT Acute Rehabilitation  Office: [phone]

## 2021-11-23 NOTE — TOC Progression Note (Signed)
Transition of Care [facility]) - Progression Note    Patient Details  Name: [name] MRN: [id number] Date of Birth: November 19, 1931  [facility] CM/SW Contact  Purcell Mouton, RN Phone Number: 11/23/2021, 2:51 PM  Clinical Narrative:    SNF bed offers given to pt's daughter [name].   Expected Discharge Plan: [place] Barriers to Discharge: Continued Medical Work up  Expected Discharge Plan and Services Expected Discharge Plan: [place] In-house Referral: NA Discharge Planning Services: CM Consult Post Acute Care Choice: [place] Living arrangements for the past 2 months: [place] Arranged: NA [place] Agency: NA         Social Determinants of Health (SDOH) Interventions    Readmission Risk Interventions    11/20/2021    4:10 PM 11/02/2021   12:11 PM  Readmission Risk Prevention Plan  Transportation Screening Complete Complete  PCP or Specialist Appt within 5-7 Days Complete Complete  Home Care Screening Complete Complete  Medication Review (RN CM) Complete Complete

## 2021-11-23 NOTE — Progress Notes (Signed)
PROGRESS NOTE  [name] YNW:[id number] DOB: 23-Sep-1931   PCP: Crist Infante, MD  Patient is from: Home.  DOA: 11/19/2021 LOS: 3  Chief complaints Chief Complaint  Patient presents with   Weakness    Recent DX of UTI states she believes she may still have UTI despite currently being on ABX      Brief Narrative / Interim history: 88 y.o. female with PMH of PAF on Eliquis, OSA on CPAP, CVA, MCI, HTN, HLD, recent COVID-19 infection, recurrent UTI on prophylactic antibiotics and recent hospitalization for encephalopathy and possible UTI returning with generalized weakness, "fever", chills, nausea and dysuria, and admitted for possible UTI, dehydration, generalized weakness and failure to thrive.  Started on IV fluid and IV Zosyn.  Per family, very independent and active until recent hospitalization for COVID-19.  However, some decline in cognitive function since she had stroke in 2021.  Blood culture with GPR in 1 out of 2 aerobic bottles.  Culture with multiple species.  Dysuria resolved after starting Pyridium.  Therapy recommended SNF.  Medically optimized for discharge to SNF.  Subjective: Seen and examined earlier this morning.  No major events overnight of this morning.  Still with some dysuria but not severe.  Tolerated low-dose Remeron.  Patient's daughter at bedside.  Objective: Vitals:   11/22/21 1959 11/23/21 0434 11/23/21 1000 11/23/21 1307  BP: 118/68 (!) 148/78  138/75  Pulse: 77 86  82  Resp: '20 20  20  '$ Temp: 99.4 F (37.4 C) 99.2 F (37.3 C) 98.3 F (36.8 C) 98.6 F (37 C)  TempSrc: Oral Oral Oral Oral  SpO2: 93% (!) 89%  93%  Weight:      Height:        Examination:  GENERAL: No apparent distress.  Nontoxic. HEENT: MMM.  Vision and hearing grossly intact.  NECK: Supple.  No apparent JVD.  RESP:  No IWOB.  Fair aeration bilaterally. CVS:  RRR. Heart sounds normal.  ABD/GI/GU: BS+. Abd soft, NTND.  MSK/EXT:  Moves extremities. No apparent deformity. No  edema.  SKIN: no apparent skin lesion or wound NEURO: Awake and alert. Oriented appropriately.  No apparent focal neuro deficit. PSYCH: Calm. Normal affect.   Procedures:  None  Microbiology summarized: Urine culture with multiple species Blood culture with GPR in 1 out of 2 aerobic bottles  Assessment and plan: Principal Problem:   UTI (urinary tract infection) Active Problems:   PAF (paroxysmal atrial fibrillation) (HCC)   Essential hypertension   Emphysema of lung (HCC)   Obstructive sleep apnea   Generalized weakness   Hyperglycemia   Lactic acidosis   History of COVID-19   Dehydration   Failure to thrive in adult   Cognitive decline  Urinary tract infection: Continues to have mild temp despite antibiotics.  UA with bacteriuria, moderate Hgb, trace LE and negative nitrite.  Urine culture with multiple species.  GPR in blood likely contaminant.  Pro-Cal negative.  Hemodynamically stable.  Dysuria improved with Pyridium. -Completed 3 days of IV Zosyn on 10/29. -Continue Pyridium for total of 2 days -Encouraged to follow-up with a urologist after discharge.   Generalized weakness/failure to thrive: Family noted some cognitive decline and she had a stroke 2 years ago.  However, she was physically active until recent hospitalization for COVID earlier this month.  She has been deconditioned since then.  She might have progressive dementia as a result of COVID.  No focal neurodeficit.  TSH within normal. -Treat treatable causes -PT/OT-recommended SNF. -OOB  for meals  Dehydration: as evidenced by lactic acidosis and hemoconcentration.  Resolved.   Lactic acidosis: Resolved.   Paroxysmal A-fib: Seems to be in sinus rhythm now.  Not on rate or rhythm control medication.  She has history of tachybradycardia syndrome.  However, her heart monitor in 05/2021 showed 100% A-fib -Continue home Eliquis   Emphysema: Stable.  No respiratory symptoms -Continue as needed DuoNeb    Obstructive sleep apnea on CPAP -Nightly CPAP   History of COVID: Tested positive on 10/29/2021 -No isolation precaution.   "Squamous cell carcinoma" of the skin: Just had resection the day prior to presentation.  Has clear looking dressing over her nose. -Outpatient follow-up  Anxiety state -Continue low-dose Remeron.  Increase to 15 mg if she continues to tolerate  Body mass index is 29.29 kg/m.           DVT prophylaxis:   apixaban (ELIQUIS) tablet 5 mg  Code Status: Full code Family Communication: Updated patient's daughter at bedside. Level of care: Med-Surg Status is: Inpatient Remains inpatient appropriate because: Safe disposition/SNF bed   Final disposition: SNF Consultants:  None  Sch Meds:  Scheduled Meds:  amLODipine  5 mg Oral Daily   apixaban  5 mg Oral BID   cholecalciferol  2,000 Units Oral QHS   mirabegron ER  25 mg Oral Daily   mirtazapine  7.5 mg Oral QHS   multivitamin with minerals  1 tablet Oral Daily   Continuous Infusions:   PRN Meds:.acetaminophen **OR** acetaminophen, ipratropium-albuterol, ondansetron **OR** ondansetron (ZOFRAN) IV, polyethylene glycol, senna-docusate  Antimicrobials: Anti-infectives (From admission, onward)    Start     Dose/Rate Route Frequency Ordered Stop   11/21/21 1000  piperacillin-tazobactam (ZOSYN) IVPB 3.375 g        3.375 g 12.5 mL/hr over 240 Minutes Intravenous Every 8 hours 11/21/21 0830 11/23/21 0519   11/19/21 1815  piperacillin-tazobactam (ZOSYN) IVPB 3.375 g  Status:  Discontinued        3.375 g 12.5 mL/hr over 240 Minutes Intravenous Every 12 hours 11/19/21 1802 11/21/21 0830   11/19/21 1715  vancomycin (VANCOCIN) IVPB 1000 mg/200 mL premix  Status:  Discontinued        1,000 mg 200 mL/hr over 60 Minutes Intravenous  Once 11/19/21 1704 11/19/21 1738        I have personally reviewed the following labs and images: CBC: Recent Labs  Lab 11/17/21 0510 11/19/21 1351 11/20/21 0422  11/21/21 0439  WBC 17.7* 11.2* 8.9 9.3  NEUTROABS 15.3* 8.1*  --   --   HGB 11.3* 12.5 12.1 12.1  HCT 34.0* 37.2 36.0 36.5  MCV 99.1 99.5 98.6 98.1  PLT 278 350 308 298   BMP &GFR Recent Labs  Lab 11/19/21 1351 11/20/21 0422 11/21/21 0439  NA 136 137 136  K 3.9 3.9 4.0  CL 102 103 103  CO2 '25 25 25  '$ GLUCOSE 146* 135* 144*  BUN '16 13 11  '$ CREATININE 0.84 0.81 0.78  CALCIUM 8.1* 8.1* 8.1*  MG  --  2.3 2.6*  PHOS  --  3.5 3.1   Estimated Creatinine Clearance: 41.8 mL/min (by C-G formula based on SCr of 0.78 mg/dL). Liver & Pancreas: Recent Labs  Lab 11/19/21 1351 11/20/21 0422 11/21/21 0439  AST 33 23  --   ALT 118* 84*  --   ALKPHOS 55 48  --   BILITOT 1.1 0.9  --   PROT 6.1* 5.6*  --   ALBUMIN 2.9* 2.6* 2.7*  No results for input(s): "LIPASE", "AMYLASE" in the last 168 hours. Recent Labs  Lab 11/20/21 0422  AMMONIA 26   Diabetic: No results for input(s): "HGBA1C" in the last 72 hours.  Recent Labs  Lab 11/19/21 1358  GLUCAP 146*   Cardiac Enzymes: Recent Labs  Lab 11/20/21 0422  CKTOTAL 25*   No results for input(s): "PROBNP" in the last 8760 hours. Coagulation Profile: No results for input(s): "INR", "PROTIME" in the last 168 hours. Thyroid Function Tests: No results for input(s): "TSH", "T4TOTAL", "FREET4", "T3FREE", "THYROIDAB" in the last 72 hours.  Lipid Profile: No results for input(s): "CHOL", "HDL", "LDLCALC", "TRIG", "CHOLHDL", "LDLDIRECT" in the last 72 hours. Anemia Panel: Recent Labs    11/21/21 0439  VITAMINB12 1,280*   Urine analysis:    Component Value Date/Time   COLORURINE STRAW (A) 11/19/2021 1349   APPEARANCEUR CLOUDY (A) 11/19/2021 1349   LABSPEC 1.003 (L) 11/19/2021 1349   PHURINE 8.0 11/19/2021 1349   GLUCOSEU NEGATIVE 11/19/2021 1349   HGBUR MODERATE (A) 11/19/2021 1349   BILIRUBINUR NEGATIVE 11/19/2021 1349   KETONESUR NEGATIVE 11/19/2021 1349   PROTEINUR NEGATIVE 11/19/2021 1349   NITRITE NEGATIVE 11/19/2021  1349   LEUKOCYTESUR SMALL (A) 11/19/2021 1349   Sepsis Labs: Invalid input(s): "PROCALCITONIN", "LACTICIDVEN"  Microbiology: Recent Results (from the past 240 hour(s))  Respiratory (~20 pathogens) panel by PCR     Status: None   Collection Time: 11/14/21 10:21 AM   Specimen: Nasopharyngeal Swab; Respiratory  Result Value Ref Range Status   Adenovirus NOT DETECTED NOT DETECTED Final   Coronavirus 229E NOT DETECTED NOT DETECTED Final    Comment: (NOTE) The Coronavirus on the Respiratory Panel, DOES NOT test for the novel  Coronavirus (2019 nCoV)    Coronavirus HKU1 NOT DETECTED NOT DETECTED Final   Coronavirus NL63 NOT DETECTED NOT DETECTED Final   Coronavirus OC43 NOT DETECTED NOT DETECTED Final   Metapneumovirus NOT DETECTED NOT DETECTED Final   Rhinovirus / Enterovirus NOT DETECTED NOT DETECTED Final   Influenza A NOT DETECTED NOT DETECTED Final   Influenza B NOT DETECTED NOT DETECTED Final   Parainfluenza Virus 1 NOT DETECTED NOT DETECTED Final   Parainfluenza Virus 2 NOT DETECTED NOT DETECTED Final   Parainfluenza Virus 3 NOT DETECTED NOT DETECTED Final   Parainfluenza Virus 4 NOT DETECTED NOT DETECTED Final   Respiratory Syncytial Virus NOT DETECTED NOT DETECTED Final   Bordetella pertussis NOT DETECTED NOT DETECTED Final   Bordetella Parapertussis NOT DETECTED NOT DETECTED Final   Chlamydophila pneumoniae NOT DETECTED NOT DETECTED Final   Mycoplasma pneumoniae NOT DETECTED NOT DETECTED Final    Comment: Performed at [facility], [place]. [address] [place], [place] [address]  Culture, blood (Routine X 2) w Reflex to ID Panel     Status: None   Collection Time: 11/14/21 11:19 AM   Specimen: BLOOD  Result Value Ref Range Status   Specimen Description   Final    BLOOD RIGHT ANTECUBITAL Performed at [facility] [address] [place], [place] [address]    Special Requests   Final    BOTTLES DRAWN AEROBIC ONLY Blood Culture adequate  volume Performed at [facility] [address], [place], [place] [address]    Culture   Final    NO GROWTH 5 DAYS Performed at [facility], [place] [address] [place], [place] [address]    Report Status 11/19/2021 FINAL  Final  Culture, blood (Routine X 2) w  Reflex to ID Panel     Status: None   Collection Time: 11/14/21 11:19 AM   Specimen: BLOOD  Result Value Ref Range Status   Specimen Description   Final    BLOOD BLOOD RIGHT HAND Performed at [facility] [address], [place], [place] [address]    Special Requests   Final    BOTTLES DRAWN AEROBIC ONLY Blood Culture adequate volume Performed at [facility] [address] [place], [place] [address]    Culture   Final    NO GROWTH 5 DAYS Performed at [facility], [place] [address], [place], [place] [address]    Report Status 11/19/2021 FINAL  Final  Urine Culture     Status: Abnormal   Collection Time: 11/19/21  3:02 PM   Specimen: Urine, Clean Catch  Result Value Ref Range Status   Specimen Description   Final    URINE, CLEAN CATCH Performed at [facility], [place] [address], [place], [place] [address]    Special Requests   Final    NONE Performed at [facility], [place] [address], [place], [place] [address]    Culture MULTIPLE SPECIES PRESENT, SUGGEST RECOLLECTION (A)  Final   Report Status 11/20/2021 FINAL  Final  Blood culture (routine x 2)     Status: Abnormal   Collection Time: 11/19/21  5:51 PM   Specimen: BLOOD  Result Value Ref Range Status   Specimen Description   Final    BLOOD SITE NOT SPECIFIED Performed at [facility] [address], [place], [place] [address]    Special Requests   Final    BOTTLES DRAWN AEROBIC AND ANAEROBIC Blood Culture adequate volume Performed at [facility] [address] [place], [place] [address]    Culture  Setup Time   Final     GRAM POSITIVE RODS AEROBIC BOTTLE ONLY CRITICAL RESULT CALLED TO, READ BACK BY AND VERIFIED WITH: Melodye Ped PHARMD, AT 1011 11/20/21 D. VANHOOK    Culture (A)  Final    BACILLUS SPECIES Standardized susceptibility testing for this organism is not available. Performed at [facility], [place] [address], [place], [place] [address]    Report Status 11/21/2021 FINAL  Final  Blood culture (routine x 2)     Status: None (Preliminary result)   Collection Time: 11/19/21  5:53 PM   Specimen: BLOOD  Result Value Ref Range Status   Specimen Description   Final    BLOOD SITE NOT SPECIFIED Performed at [facility] [address] [place], [place] [address]    Special Requests   Final    BOTTLES DRAWN AEROBIC AND ANAEROBIC Blood Culture results may not be optimal due to an excessive volume of blood received in culture bottles Performed at [facility] [address] [place], [place] [address]    Culture   Final    NO GROWTH 4 DAYS Performed at [facility], [place] [address], [place], [place] [address]    Report Status PENDING  Incomplete    Radiology Studies: No results found.    Franshesca Chipman T. Norris  If 7PM-7AM, please contact night-coverage [email or web address] 11/23/2021, 2:09 PM

## 2021-11-23 NOTE — TOC Progression Note (Signed)
Transition of Care [facility]) - Progression Note    Patient Details  Name: AZELIA REIGER MRN: [id number] Date of Birth: 1932-01-23  Transition of Care [facility]) CM/SW Contact  Purcell Mouton, RN Phone Number: 11/23/2021, 3:50 PM  Clinical Narrative:    Daughter [name] asked for [facility], [facility], and [facility]. A call was made to these facilities, at present time they are all full. [facility] was called, [facility] will review chart. Daughter is aware.    Expected Discharge Plan: [place] Barriers to Discharge: Continued Medical Work up  Expected Discharge Plan and Services Expected Discharge Plan: [place] In-house Referral: NA Discharge Planning Services: CM Consult Post Acute Care Choice: [place] Living arrangements for the past 2 months: [place] Arranged: NA [place] Agency: NA         Social Determinants of Health (SDOH) Interventions    Readmission Risk Interventions    11/20/2021    4:10 PM 11/02/2021   12:11 PM  Readmission Risk Prevention Plan  Transportation Screening Complete Complete  PCP or Specialist Appt within 5-7 Days Complete Complete  Home Care Screening Complete Complete  Medication Review (RN CM) Complete Complete

## 2021-11-23 NOTE — Care Management Important Message (Signed)
Important Message  Patient Details IM Letter given Name: [name] MRN: [id number] Date of Birth: February 07, 1931   Medicare Important Message Given:  Yes     Kerin Salen 11/23/2021, 1:06 PM

## 2021-11-24 ENCOUNTER — Ambulatory Visit: Payer: Medicare Other | Admitting: Physician Assistant

## 2021-11-24 ENCOUNTER — Inpatient Hospital Stay (HOSPITAL_COMMUNITY): Payer: Medicare Other

## 2021-11-24 DIAGNOSIS — R509 Fever, unspecified: Secondary | ICD-10-CM

## 2021-11-24 DIAGNOSIS — N3001 Acute cystitis with hematuria: Secondary | ICD-10-CM | POA: Diagnosis not present

## 2021-11-24 DIAGNOSIS — R531 Weakness: Secondary | ICD-10-CM | POA: Diagnosis not present

## 2021-11-24 DIAGNOSIS — R4189 Other symptoms and signs involving cognitive functions and awareness: Secondary | ICD-10-CM | POA: Diagnosis not present

## 2021-11-24 LAB — CBC WITH DIFFERENTIAL/PLATELET
Abs Immature Granulocytes: 0.39 10*3/uL — ABNORMAL HIGH (ref 0.00–0.07)
Basophils Absolute: 0.1 10*3/uL (ref 0.0–0.1)
Basophils Relative: 1 %
Eosinophils Absolute: 0.2 10*3/uL (ref 0.0–0.5)
Eosinophils Relative: 2 %
HCT: 40.7 % (ref 36.0–46.0)
Hemoglobin: 13.5 g/dL (ref 12.0–15.0)
Immature Granulocytes: 4 %
Lymphocytes Relative: 8 %
Lymphs Abs: 0.8 10*3/uL (ref 0.7–4.0)
MCH: 32.2 pg (ref 26.0–34.0)
MCHC: 33.2 g/dL (ref 30.0–36.0)
MCV: 97.1 fL (ref 80.0–100.0)
Monocytes Absolute: 0.8 10*3/uL (ref 0.1–1.0)
Monocytes Relative: 8 %
Neutro Abs: 8 10*3/uL — ABNORMAL HIGH (ref 1.7–7.7)
Neutrophils Relative %: 77 %
Platelets: 309 10*3/uL (ref 150–400)
RBC: 4.19 MIL/uL (ref 3.87–5.11)
RDW: 12.9 % (ref 11.5–15.5)
WBC: 10.3 10*3/uL (ref 4.0–10.5)
nRBC: 0 % (ref 0.0–0.2)

## 2021-11-24 LAB — COMPREHENSIVE METABOLIC PANEL
ALT: 73 U/L — ABNORMAL HIGH (ref 0–44)
AST: 53 U/L — ABNORMAL HIGH (ref 15–41)
Albumin: 3 g/dL — ABNORMAL LOW (ref 3.5–5.0)
Alkaline Phosphatase: 62 U/L (ref 38–126)
Anion gap: 9 (ref 5–15)
BUN: 20 mg/dL (ref 8–23)
CO2: 21 mmol/L — ABNORMAL LOW (ref 22–32)
Calcium: 8.2 mg/dL — ABNORMAL LOW (ref 8.9–10.3)
Chloride: 104 mmol/L (ref 98–111)
Creatinine, Ser: 0.94 mg/dL (ref 0.44–1.00)
GFR, Estimated: 58 mL/min — ABNORMAL LOW (ref >60)
Glucose, Bld: 160 mg/dL — ABNORMAL HIGH (ref 70–99)
Potassium: 4.1 mmol/L (ref 3.5–5.1)
Sodium: 134 mmol/L — ABNORMAL LOW (ref 135–145)
Total Bilirubin: 0.8 mg/dL (ref 0.3–1.2)
Total Protein: 6.7 g/dL (ref 6.5–8.1)

## 2021-11-24 LAB — LACTIC ACID, PLASMA
Lactic Acid, Venous: 2.4 mmol/L (ref 0.5–1.9)
Lactic Acid, Venous: 1.4 mmol/L (ref 0.5–1.9)

## 2021-11-24 LAB — CULTURE, BLOOD (ROUTINE X 2): Culture: NO GROWTH

## 2021-11-24 LAB — RESPIRATORY PANEL BY PCR

## 2021-11-24 LAB — PROCALCITONIN: Procalcitonin: <0.1 ng/mL

## 2021-11-24 LAB — SARS CORONAVIRUS 2 BY RT PCR: SARS Coronavirus 2 by RT PCR: POSITIVE — AB

## 2021-11-24 MED ORDER — MIRTAZAPINE 7.5 MG PO TABS: 7.5000 mg | ORAL_TABLET | Freq: Every day | ORAL | Status: DC

## 2021-11-24 MED ORDER — ACETAMINOPHEN 325 MG PO TABS: 650.0000 mg | ORAL_TABLET | Freq: Four times a day (QID) | ORAL | Status: DC | PRN

## 2021-11-24 MED ORDER — SENNOSIDES-DOCUSATE SODIUM 8.6-50 MG PO TABS: 1.0000 | ORAL_TABLET | Freq: Two times a day (BID) | ORAL | Status: DC | PRN

## 2021-11-24 MED ORDER — POLYETHYLENE GLYCOL 3350 17 G PO PACK: 17.0000 g | PACK | Freq: Two times a day (BID) | ORAL | 0 refills | Status: DC | PRN

## 2021-11-24 MED ORDER — LACTATED RINGERS IV SOLN: INTRAVENOUS | Status: DC

## 2021-11-24 NOTE — Progress Notes (Signed)
PT refuses to use her CPAP

## 2021-11-24 NOTE — Plan of Care (Signed)
Id brief update   Covid pcr cycle threshold: 23 to 26, indicating still ongoing infection  If blood cx negative, this would suggest (in setting of no other infectious syndrome symptomatology) persistent covid infection   -contact/droplet/airborn isolation for covid -supportive care -f/u culture -unclear treatment modalities specific to covid / benefit would defer at this time -please formally engage ID team tomorrow to see if any benefit in combination therapy for patient  -discuss with nursing staff about isolation

## 2021-11-24 NOTE — Progress Notes (Signed)
PROGRESS NOTE  [name] DVV:[id number] DOB: 1931-12-07   PCP: Crist Infante, MD  Patient is from: Home.  DOA: 11/19/2021 LOS: 4  Chief complaints Chief Complaint  Patient presents with   Weakness    Recent DX of UTI states she believes she may still have UTI despite currently being on ABX      Brief Narrative / Interim history: 88 y.o. female with PMH of PAF on Eliquis, OSA on CPAP, CVA, MCI, HTN, HLD, recent COVID-19 infection, recurrent UTI on prophylactic antibiotics and recent hospitalization for encephalopathy and possible UTI returning with generalized weakness, "fever", chills, nausea and dysuria, and admitted for possible UTI, dehydration, generalized weakness and failure to thrive.  Started on IV fluid and IV Zosyn.  Per family, very independent and active until recent hospitalization for COVID-19.  However, some decline in cognitive function since she had stroke in 2021.  Blood culture with GPR in 1 out of 2 aerobic bottles.  Culture with multiple species.  Dysuria improved with Pyridium.  Completed 3 days of IV Zosyn.  Patient was discharged to SNF but developed chills and spiked fever to 103.5 (confirmed rectally).  Discharge canceled.  ID consulted.  Infectious work-up underway  Subjective: Seen and examined this morning on this afternoon.  Patient was to discharge to SNF but she developed chills and spiked fever to 103.5 F rectally.  She has no headache, respiratory, GI or new UTI symptoms.  Objective: Vitals:   11/24/21 1445 11/24/21 1500 11/24/21 1515 11/24/21 1545  BP:      Pulse:      Resp:      Temp: (!) 103.5 F (39.7 C)   100.3 F (37.9 C)  TempSrc: Rectal   Oral  SpO2:  (!) 85% 92%   Weight:      Height:        Examination:  GENERAL: No apparent distress.  Nontoxic. HEENT: MMM.  Vision and hearing grossly intact.  NECK: Supple.  No apparent JVD.  RESP:  No IWOB.  Fair aeration bilaterally. CVS:  RRR. Heart sounds normal.  ABD/GI/GU: BS+. Abd  soft, NTND.  MSK/EXT:  Moves extremities. No apparent deformity. No edema.  No calf tenderness. SKIN: no apparent skin lesion or wound NEURO: Awake and alert. Oriented appropriately.  No apparent focal neuro deficit. PSYCH: Somewhat anxious.  Procedures:  None  Microbiology summarized: Urine culture with multiple species Blood culture with GPR in 1 out of 2 aerobic bottles  Assessment and plan: Principal Problem:   UTI (urinary tract infection) Active Problems:   PAF (paroxysmal atrial fibrillation) (HCC)   Essential hypertension   Emphysema of lung (HCC)   Obstructive sleep apnea   Generalized weakness   Hyperglycemia   Lactic acidosis   History of COVID-19   Dehydration   Failure to thrive in adult   Cognitive decline  Fever of unknown origin: Patient developed chills and found to be febrile to 103.5 F this afternoon.  Clear source of infection.  She has no neuro, respiratory, GI or new UTI symptoms.  Not toxic.  Hemodynamically stable.  Head CT on 10/22 without significant finding.   -Discussed with ID-recommended blood culture, venous Doppler to exclude DVT and retesting for COVID -Full RVP, CXR, lactic acid, procalcitonin, CBC with differential and CMP -Cancelled discharge order  Possible urinary tract infection: Completed 3 days of IV Zosyn.  Dysuria improved with Pyridium. -Encouraged to follow-up with a urologist after discharge.   Generalized weakness/failure to thrive: Family noted some  cognitive decline and she had a stroke 2 years ago.  However, she was physically active until recent hospitalization for COVID earlier this month.  She has been deconditioned since then.  She might have progressive dementia as a result of COVID.  No focal neurodeficit.  TSH within normal. -Treat treatable causes -PT/OT-recommended SNF. -OOB for meals  Dehydration: as evidenced by lactic acidosis and hemoconcentration.  Resolved.   Lactic acidosis: Resolved.   Paroxysmal A-fib:  Seems to be in sinus rhythm now.  Not on rate or rhythm control medication.  She has history of tachybradycardia syndrome.  However, her heart monitor in 05/2021 showed 100% A-fib -Continue home Eliquis   Emphysema: Stable.  No respiratory symptoms -Continue as needed DuoNeb   Obstructive sleep apnea on CPAP -Nightly CPAP   History of COVID: Tested positive on 10/29/2021 -No isolation precaution.   "Squamous cell carcinoma" of the skin: Just had resection the day prior to presentation.  Has clear looking dressing over her nose. -Outpatient follow-up  Anxiety state -Continue low-dose Remeron.  Increase to 15 mg if she continues to tolerate  Body mass index is 29.29 kg/m.           DVT prophylaxis:   apixaban (ELIQUIS) tablet 5 mg  Code Status: Full code Family Communication: Updated patient's daughter at bedside. Level of care: Telemetry Status is: Inpatient Remains inpatient appropriate because: Fever of unknown origin   Final disposition: SNF Consultants:  Infectious disease  Sch Meds:  Scheduled Meds:  amLODipine  5 mg Oral Daily   apixaban  5 mg Oral BID   cholecalciferol  2,000 Units Oral QHS   mirabegron ER  25 mg Oral Daily   mirtazapine  7.5 mg Oral QHS   multivitamin with minerals  1 tablet Oral Daily   Continuous Infusions:   PRN Meds:.acetaminophen **OR** acetaminophen, ipratropium-albuterol, ondansetron **OR** ondansetron (ZOFRAN) IV, polyethylene glycol, senna-docusate  Antimicrobials: Anti-infectives (From admission, onward)    Start     Dose/Rate Route Frequency Ordered Stop   11/21/21 1000  piperacillin-tazobactam (ZOSYN) IVPB 3.375 g        3.375 g 12.5 mL/hr over 240 Minutes Intravenous Every 8 hours 11/21/21 0830 11/23/21 0519   11/19/21 1815  piperacillin-tazobactam (ZOSYN) IVPB 3.375 g  Status:  Discontinued        3.375 g 12.5 mL/hr over 240 Minutes Intravenous Every 12 hours 11/19/21 1802 11/21/21 0830   11/19/21 1715  vancomycin  (VANCOCIN) IVPB 1000 mg/200 mL premix  Status:  Discontinued        1,000 mg 200 mL/hr over 60 Minutes Intravenous  Once 11/19/21 1704 11/19/21 1738        I have personally reviewed the following labs and images: CBC: Recent Labs  Lab 11/19/21 1351 11/20/21 0422 11/21/21 0439 11/24/21 1537  WBC 11.2* 8.9 9.3 10.3  NEUTROABS 8.1*  --   --  8.0*  HGB 12.5 12.1 12.1 13.5  HCT 37.2 36.0 36.5 40.7  MCV 99.5 98.6 98.1 97.1  PLT 350 308 298 309   BMP &GFR Recent Labs  Lab 11/19/21 1351 11/20/21 0422 11/21/21 0439  NA 136 137 136  K 3.9 3.9 4.0  CL 102 103 103  CO2 '25 25 25  '$ GLUCOSE 146* 135* 144*  BUN '16 13 11  '$ CREATININE 0.84 0.81 0.78  CALCIUM 8.1* 8.1* 8.1*  MG  --  2.3 2.6*  PHOS  --  3.5 3.1   Estimated Creatinine Clearance: 41.8 mL/min (by C-G formula based on  SCr of 0.78 mg/dL). Liver & Pancreas: Recent Labs  Lab 11/19/21 1351 11/20/21 0422 11/21/21 0439  AST 33 23  --   ALT 118* 84*  --   ALKPHOS 55 48  --   BILITOT 1.1 0.9  --   PROT 6.1* 5.6*  --   ALBUMIN 2.9* 2.6* 2.7*   No results for input(s): "LIPASE", "AMYLASE" in the last 168 hours. Recent Labs  Lab 11/20/21 0422  AMMONIA 26   Diabetic: No results for input(s): "HGBA1C" in the last 72 hours.  Recent Labs  Lab 11/19/21 1358  GLUCAP 146*   Cardiac Enzymes: Recent Labs  Lab 11/20/21 0422  CKTOTAL 25*   No results for input(s): "PROBNP" in the last 8760 hours. Coagulation Profile: No results for input(s): "INR", "PROTIME" in the last 168 hours. Thyroid Function Tests: No results for input(s): "TSH", "T4TOTAL", "FREET4", "T3FREE", "THYROIDAB" in the last 72 hours.  Lipid Profile: No results for input(s): "CHOL", "HDL", "LDLCALC", "TRIG", "CHOLHDL", "LDLDIRECT" in the last 72 hours. Anemia Panel: No results for input(s): "VITAMINB12", "FOLATE", "FERRITIN", "TIBC", "IRON", "RETICCTPCT" in the last 72 hours.  Urine analysis:    Component Value Date/Time   COLORURINE STRAW (A)  11/19/2021 1349   APPEARANCEUR CLOUDY (A) 11/19/2021 1349   LABSPEC 1.003 (L) 11/19/2021 1349   PHURINE 8.0 11/19/2021 1349   GLUCOSEU NEGATIVE 11/19/2021 1349   HGBUR MODERATE (A) 11/19/2021 1349   BILIRUBINUR NEGATIVE 11/19/2021 1349   KETONESUR NEGATIVE 11/19/2021 1349   PROTEINUR NEGATIVE 11/19/2021 1349   NITRITE NEGATIVE 11/19/2021 1349   LEUKOCYTESUR SMALL (A) 11/19/2021 1349   Sepsis Labs: Invalid input(s): "PROCALCITONIN", "LACTICIDVEN"  Microbiology: Recent Results (from the past 240 hour(s))  Urine Culture     Status: Abnormal   Collection Time: 11/19/21  3:02 PM   Specimen: Urine, Clean Catch  Result Value Ref Range Status   Specimen Description   Final    URINE, CLEAN CATCH Performed at [facility], [place], [place] [address], [place], [place] [address]    Special Requests   Final    NONE Performed at [facility], [place] [address] [place], [place] [address]    Culture MULTIPLE SPECIES PRESENT, SUGGEST RECOLLECTION (A)  Final   Report Status 11/20/2021 FINAL  Final  Blood culture (routine x 2)     Status: Abnormal   Collection Time: 11/19/21  5:51 PM   Specimen: BLOOD  Result Value Ref Range Status   Specimen Description   Final    BLOOD SITE NOT SPECIFIED Performed at [facility] [address] [place], [place] [address]    Special Requests   Final    BOTTLES DRAWN AEROBIC AND ANAEROBIC Blood Culture adequate volume Performed at [facility] [address], [place], [place] [address]    Culture  Setup Time   Final    GRAM POSITIVE RODS AEROBIC BOTTLE ONLY CRITICAL RESULT CALLED TO, READ BACK BY AND VERIFIED WITH: Melodye Ped PHARMD, AT 1011 11/20/21 D. VANHOOK    Culture (A)  Final    BACILLUS SPECIES Standardized susceptibility testing for this organism is not available. Performed at [facility], [place] [address], [place], [place] [address]    Report Status 11/21/2021 FINAL   Final  Blood culture (routine x 2)     Status: None   Collection Time: 11/19/21  5:53 PM   Specimen: BLOOD  Result Value Ref Range Status   Specimen Description   Final  BLOOD SITE NOT SPECIFIED Performed at [facility], [place] [address] [place], [place] [address]    Special Requests   Final    BOTTLES DRAWN AEROBIC AND ANAEROBIC Blood Culture results may not be optimal due to an excessive volume of blood received in culture bottles Performed at [facility] [address], [place], [place] [address]    Culture   Final    NO GROWTH 5 DAYS Performed at [facility], [place] [address] [place], [place] [address]    Report Status 11/24/2021 FINAL  Final    Radiology Studies: DG Chest Port 1 View  Result Date: 11/24/2021 CLINICAL DATA:  Hypoxia.  History of smoking and AFib EXAM: PORTABLE CHEST 1 VIEW COMPARISON:  11/19/21 FINDINGS: No pleural effusion. No pneumothorax. Increased left basilar pulmonary opacity compared to prior exam. Cardiac and mediastinal contours are unchanged. No displaced rib fractures. Visualized upper abdomen is unremarkable. IMPRESSION: Increased left basilar pulmonary opacity compared to prior exam, which may represent atelectasis or infection. Electronically Signed   By: Marin Roberts M.D.   On: 11/24/2021 15:48      Karlie Aung [facility]  If 7PM-7AM, please contact night-coverage [email or web address] 11/24/2021, 4:14 PM

## 2021-11-24 NOTE — Progress Notes (Signed)
PT managed personal CPAP did not have it on at RT visit.

## 2021-11-24 NOTE — Discharge Summary (Signed)
Physician Discharge Summary  [name] EXB:[id number] DOB: 09-17-1931 DOA: 11/19/2021  PCP: Crist Infante, MD  Admit date: 11/19/2021 Discharge date: 11/24/2021 Admitted From: Home Disposition: SNF Recommendations for Outpatient Follow-up:  Follow up with PCP and urology in 1 to 2 weeks Check BMP and CBC in 1 to 2 weeks Please follow up on the following pending results: None none   Discharge Condition: Stable CODE STATUS: Full code  Contact information for follow-up providers     Bjorn Loser, MD. Schedule an appointment as soon as possible for a visit in 1 week(s).   Specialty: Urology Contact information: [address] [place] [address] [phone]              Contact information for after-discharge care     Destination     [facility] Preferred SNF .   Service: Skilled Nursing Contact information: [address] [place] [facility] course 88 y.o. female with PMH of PAF on Eliquis, OSA on CPAP, CVA, MCI, HTN, HLD, recent COVID-19 infection, recurrent UTI on prophylactic antibiotics and recent hospitalization for encephalopathy and possible UTI returning with generalized weakness, "fever", chills, nausea and dysuria, and admitted for possible UTI, dehydration, generalized weakness and failure to thrive.  Started on IV fluid and IV Zosyn.  Per family, very independent and active until recent hospitalization for COVID-19.  However, some decline in cognitive function since she had stroke in 2021.   Blood culture with GPR in 1 out of 2 aerobic bottles.  Culture with multiple species.  Dysuria resolved after starting Pyridium.  Completed 3 days of IV Zosyn in house.  Recommend outpatient follow-up with urology in 1 to 2 weeks.   Therapy recommended SNF.  Medically optimized for discharge to SNF.  See individual problem list below for more.   Problems addressed during  this hospitalization Principal Problem:   UTI (urinary tract infection) Active Problems:   PAF (paroxysmal atrial fibrillation) (HCC)   Essential hypertension   Emphysema of lung (HCC)   Obstructive sleep apnea   Generalized weakness   Hyperglycemia   Lactic acidosis   History of COVID-19   Dehydration   Failure to thrive in adult   Cognitive decline   Urinary tract infection: Continues to have mild temp despite antibiotics.  UA with bacteriuria, moderate Hgb, trace LE and negative nitrite.  Urine culture with multiple species.  GPR in blood likely contaminant.  Pro-Cal negative.  Hemodynamically stable.  Dysuria improved with Pyridium. -Completed 3 days of IV Zosyn on 10/29. -Outpatient follow-up with urology in 1 to 2 weeks.   Generalized weakness/failure to thrive: Family noted some cognitive decline and she had a stroke 2 years ago.  However, she was physically active until recent hospitalization for COVID earlier this month.  She has been deconditioned since then.  She might have progressive dementia as a result of COVID.  No focal neurodeficit.  TSH within normal. -Continue PT/OT at rehab.   Dehydration: as evidenced by lactic acidosis and hemoconcentration.  Resolved.   Lactic acidosis: Resolved.   Paroxysmal A-fib: Seems to be in sinus rhythm now.  Not on rate or rhythm control medication.  She has history of tachybradycardia syndrome.  However, her heart monitor in 05/2021 showed 100% A-fib -Continue home Eliquis   Emphysema: Stable.  No respiratory symptoms -Continue home meds.  Obstructive sleep apnea on CPAP -Nightly CPAP   History of COVID: Tested positive on 10/29/2021.  Completed isolation precaution.   "Squamous cell carcinoma" of the skin: Just had resection the day prior to presentation.  Has clear looking dressing over her nose. -Outpatient follow-up with plastic surgery as previously planned   Anxiety state -Continue low-dose Remeron.  Consider increasing  to 15 mg if she continues to tolerate           Vital signs Vitals:   11/23/21 1307 11/23/21 2023 11/24/21 0454 11/24/21 0950  BP: 138/75 120/71 137/89 134/67  Pulse: 82 92 67 92  Temp: 98.6 F (37 C) 100 F (37.8 C) 99.9 F (37.7 C) 99.5 F (37.5 C)  Resp: '20 18 18 16  '$ Height:      Weight:      SpO2: 93% 96% 92% 93%  TempSrc: Oral Oral Oral Oral  BMI (Calculated):         Discharge exam  GENERAL: No apparent distress.  Nontoxic. HEENT: MMM.  Vision and hearing grossly intact.  NECK: Supple.  No apparent JVD.  RESP:  No IWOB.  Fair aeration bilaterally. CVS:  RRR. Heart sounds normal.  ABD/GI/GU: BS+. Abd soft, NTND.  MSK/EXT:  Moves extremities. No apparent deformity. No edema.  SKIN: no apparent skin lesion or wound NEURO: Awake and alert. Oriented appropriately.  No apparent focal neuro deficit. PSYCH: Calm. Normal affect.   Discharge Instructions  Allergies as of 11/24/2021       Reactions   Bee Venom Anaphylaxis   Mixed Vespid Venom Anaphylaxis   Codeine Other (See Comments)   Exact allergic reaction not known   Sulfa Antibiotics Itching, Nausea Only   Erythromycin Nausea And Vomiting        Medication List     STOP taking these medications    atorvastatin 10 MG tablet Commonly known as: LIPITOR   cephALEXin 250 MG capsule Commonly known as: KEFLEX   CRANBERRY PO   FIBER ADULT GUMMIES PO   guaiFENesin-dextromethorphan 100-10 MG/5ML syrup Commonly known as: ROBITUSSIN DM   oxyCODONE 5 MG immediate release tablet Commonly known as: Oxy IR/ROXICODONE   VITAMIN B12 PO       TAKE these medications    acetaminophen 325 MG tablet Commonly known as: TYLENOL Take 2 tablets (650 mg total) by mouth every 6 (six) hours as needed for mild pain, fever or headache.   amLODipine 2.5 MG tablet Commonly known as: NORVASC Take 1 tablet (2.5 mg total) by mouth daily.   apixaban 5 MG Tabs tablet Commonly known as: Eliquis Take 1 tablet (5 mg  total) by mouth 2 (two) times daily.   ascorbic acid 500 MG tablet Commonly known as: VITAMIN C Take 1 tablet (500 mg total) by mouth daily.   Gemtesa 75 MG Tabs Generic drug: Vibegron Take 75 mg by mouth daily.   Ipratropium-Albuterol 20-100 MCG/ACT Aers respimat Commonly known as: COMBIVENT Inhale 1 puff into the lungs every 6 (six) hours as needed for wheezing or shortness of breath.   mirtazapine 7.5 MG tablet Commonly known as: REMERON Take 1 tablet (7.5 mg total) by mouth at bedtime.   ondansetron 4 MG tablet Commonly known as: ZOFRAN Take 1 tablet (4 mg total) by mouth every 6 (six) hours as needed for nausea.   [facility] Colon Health Caps Take 1 capsule by mouth daily.   polyethylene glycol 17 g packet Commonly known as: MIRALAX / GLYCOLAX Take 17 g by mouth 2 (two)  times daily as needed for mild constipation.   SENIOR VITES PO Take 1 tablet by mouth daily with breakfast.   senna-docusate 8.6-50 MG tablet Commonly known as: Senokot-S Take 1 tablet by mouth 2 (two) times daily as needed for moderate constipation.   vitamin D3 50 MCG (2000 UT) Caps Take 2,000 Units by mouth at bedtime.   zinc sulfate 220 (50 Zn) MG capsule Take 1 capsule (220 mg total) by mouth daily.        Consultations: None  Procedures/Studies:   DG Chest Portable 1 View  Result Date: 11/19/2021 CLINICAL DATA:  Altered mental status EXAM: PORTABLE CHEST 1 VIEW COMPARISON:  Chest 11/14/2021 FINDINGS: The heart size and mediastinal contours are within normal limits. Both lungs are clear. The visualized skeletal structures are unremarkable. Improvement in bilateral airspace disease which may have been Hartman edema previously. IMPRESSION: No active disease. Electronically Signed   By: Franchot Gallo M.D.   On: 11/19/2021 14:26   CT CHEST ABDOMEN PELVIS W CONTRAST  Result Date: 11/15/2021 CLINICAL DATA:  Sepsis, fever EXAM: CT CHEST, ABDOMEN, AND PELVIS WITH CONTRAST TECHNIQUE:  Multidetector CT imaging of the chest, abdomen and pelvis was performed following the standard protocol during bolus administration of intravenous contrast. RADIATION DOSE REDUCTION: This exam was performed according to the departmental dose-optimization program which includes automated exposure control, adjustment of the mA and/or kV according to patient size and/or use of iterative reconstruction technique. CONTRAST:  1108m OMNIPAQUE IOHEXOL 300 MG/ML  SOLN COMPARISON:  Previous studies including CT abdomen and pelvis done on 10/29/2021, CT chest done on 01/21/2020 FINDINGS: CT CHEST FINDINGS Cardiovascular: Major vascular structures are unremarkable. Heart is enlarged in size. Calcifications are noted in mitral annulus. Mediastinum/Nodes: There are slightly enlarged lymph nodes in mediastinum with no significant interval change. Lungs/Pleura: Centrilobular and panlobular emphysema is seen. Increased interstitial markings are seen in the periphery of both lungs, more so in left upper lobe. Findings suggest chronic interstitial lung disease with scarring. There is no focal pulmonary consolidation. There is no pleural effusion or pneumothorax. There is small plaque-like pleural density in the posterolateral aspect of right lower lung field which has not changed, possibly scarring. Musculoskeletal: No acute findings are seen in bony structures in the thorax. CT ABDOMEN PELVIS FINDINGS Hepatobiliary: There is fatty infiltration in liver. There is 9 mm low-density in the right lobe of liver with no change, possibly a cyst. There is no dilation of bile ducts. Gallbladder is not distended. There is no fluid around the gallbladder. Pancreas: No focal abnormalities are seen. Spleen: Unremarkable. Adrenals/Urinary Tract: Adrenals are unremarkable. There is no hydronephrosis. There are no renal or ureteral stones. Urinary bladder is unremarkable. Stomach/Bowel: Stomach is distended with fluid in the lumen. There is no focal  wall thickening in stomach. There is no significant small bowel dilation. Appendix is not dilated. There is no significant wall thickening in colon. Scattered diverticula seen in colon without signs of focal diverticulitis. Vascular/Lymphatic: Atherosclerotic plaques and calcifications are seen in aorta and its major branches. Reproductive: Uterus is not seen. Other: There is no ascites or pneumoperitoneum. Musculoskeletal: Slight decrease in height of body of L2 vertebra appears stable. There is fusion of bodies L3 and L4 vertebrae. Degenerative changes are noted with disc space narrowing, bony spurs and facet hypertrophy in the lumbar spine and lower thoracic spine. IMPRESSION: No acute findings are seen in CT scan of chest, abdomen and pelvis. There is no focal pulmonary consolidation. There is no pleural effusion.  Chronic interstitial lung disease with scarring. There is no evidence of intestinal obstruction or pneumoperitoneum. There is no hydronephrosis. Appendix is not dilated. Fatty liver. Scattered diverticula are seen in colon without signs of focal diverticulitis. Arteriosclerosis. Lumbar spondylosis. Other findings as described in the body of the report. Electronically Signed   By: Elmer Picker M.D.   On: 11/15/2021 14:56   DG Chest Port 1 View  Result Date: 11/14/2021 CLINICAL DATA:  88 year old female with history of shortness of breath. EXAM: PORTABLE CHEST 1 VIEW COMPARISON:  Chest x-ray 11/12/2021. FINDINGS: Widespread but patchy areas of interstitial prominence and peribronchial cuffing are noted throughout the lungs bilaterally, most severe in the medial aspect of the right lung base and in the periphery of the left upper lobe near the apex. No confluent consolidative airspace disease. No pleural effusions. No pneumothorax. No evidence of pulmonary edema. Heart size is borderline enlarged. Upper mediastinal contours are within normal limits. Atherosclerotic calcifications are noted in  the thoracic aorta. IMPRESSION: 1. Increasing areas of interstitial prominence and peribronchial cuffing asymmetrically distributed in the lungs bilaterally concerning for progressive bronchitis, potentially with developing multilobar bilateral bronchopneumonia. 2. Aortic atherosclerosis. Electronically Signed   By: Vinnie Langton M.D.   On: 11/14/2021 06:31   DG Chest 1 View  Result Date: 11/12/2021 CLINICAL DATA:  Shortness of breath. EXAM: CHEST  1 VIEW COMPARISON:  One-view chest x-ray 11/10/2021 FINDINGS: Patient is rotated the left since the prior exam. This exaggerates the heart size. Mild pulmonary vascular congestion is increased slightly. No focal airspace disease is present. Remote right-sided rib fractures noted. IMPRESSION: Slight increase in mild pulmonary vascular congestion. Electronically Signed   By: San Morelle M.D.   On: 11/12/2021 15:38   DG Chest Port 1 View  Result Date: 11/10/2021 CLINICAL DATA:  Shortness of breath EXAM: PORTABLE CHEST 1 VIEW COMPARISON:  11/01/2021 FINDINGS: The heart size and mediastinal contours are within normal limits. Aortic atherosclerosis. No focal airspace consolidation, pleural effusion, or pneumothorax. The visualized skeletal structures are unremarkable. IMPRESSION: No active disease. Electronically Signed   By: Davina Poke D.O.   On: 11/10/2021 10:13   DG CHEST PORT 1 VIEW  Result Date: 11/01/2021 CLINICAL DATA:  COVID, dyspnea EXAM: PORTABLE CHEST 1 VIEW COMPARISON:  Prior chest x-ray 10/29/2021 FINDINGS: Stable cardiac and mediastinal contours. Atherosclerotic calcifications again noted in the transverse aorta. Similar appearance of hazy airspace opacity overlying the left lung base. The remainder of the lungs are clear. No pneumothorax. Stable chronic bronchitic changes. IMPRESSION: Stable appearance of the chest with probable small layering left pleural effusion and associated left basilar atelectasis versus infiltrate.  Electronically Signed   By: Jacqulynn Cadet M.D.   On: 11/01/2021 07:29   CT Head Wo Contrast  Result Date: 10/29/2021 CLINICAL DATA:  Altered mental status EXAM: CT HEAD WITHOUT CONTRAST TECHNIQUE: Contiguous axial images were obtained from the base of the skull through the vertex without intravenous contrast. RADIATION DOSE REDUCTION: This exam was performed according to the departmental dose-optimization program which includes automated exposure control, adjustment of the mA and/or kV according to patient size and/or use of iterative reconstruction technique. COMPARISON:  06/09/2020 FINDINGS: Brain: No evidence of acute infarction, hemorrhage, hydrocephalus, extra-axial collection or mass lesion/mass effect. Mild atrophic and chronic white matter ischemic changes are noted. Vascular: No hyperdense vessel or unexpected calcification. Skull: Normal. Negative for fracture or focal lesion. Sinuses/Orbits: No acute finding. Other: None. IMPRESSION: Chronic atrophic and ischemic changes without acute abnormality. Electronically Signed  By: Inez Catalina M.D.   On: 10/29/2021 21:13   CT Abdomen Pelvis W Contrast  Result Date: 10/29/2021 CLINICAL DATA:  Sepsis EXAM: CT ABDOMEN AND PELVIS WITH CONTRAST TECHNIQUE: Multidetector CT imaging of the abdomen and pelvis was performed using the standard protocol following bolus administration of intravenous contrast. RADIATION DOSE REDUCTION: This exam was performed according to the departmental dose-optimization program which includes automated exposure control, adjustment of the mA and/or kV according to patient size and/or use of iterative reconstruction technique. CONTRAST:  59m OMNIPAQUE IOHEXOL 300 MG/ML  SOLN COMPARISON:  12/20/2019 FINDINGS: Lower chest: No acute abnormality.  Small hiatal hernia Hepatobiliary: Tiny probable cyst within the right hepatic dome. Liver otherwise unremarkable. Gallbladder unremarkable. No intra or extrahepatic biliary ductal  dilation. Pancreas: Unremarkable Spleen: Unremarkable Adrenals/Urinary Tract: Adrenal glands are unremarkable. Kidneys are normal, without renal calculi, focal lesion, or hydronephrosis. Bladder is unremarkable. Stomach/Bowel: Moderate sigmoid diverticulosis. The stomach, small bowel, and large bowel are otherwise unremarkable. No evidence of obstruction or focal inflammation. Appendix normal. No free intraperitoneal gas or fluid. Vascular/Lymphatic: Aortic atherosclerosis. No enlarged abdominal or pelvic lymph nodes. Reproductive: Status post hysterectomy. No adnexal masses. Other: No abdominal wall hernia or abnormality. No abdominopelvic ascites. Musculoskeletal: Osseous structures are age-appropriate. No acute bone abnormality. No lytic or blastic bone lesion. IMPRESSION: 1. No acute intra-abdominal pathology identified. No definite radiographic explanation for the patient's reported sepsis. 2. Small hiatal hernia. 3. Moderate sigmoid diverticulosis without superimposed acute inflammatory change. Aortic Atherosclerosis (ICD10-I70.0). Electronically Signed   By: AFidela SalisburyM.D.   On: 10/29/2021 20:58   DG Chest 1 View  Result Date: 10/29/2021 CLINICAL DATA:  Pt BIBA from home. Pt c/o back and shoulder pain- has had MRI- was dx with sciatica. Pt has been taking prednisone and tramadol with minimal releif, but has been able to perform ADLs. EXAM: CHEST  1 VIEW COMPARISON:  06/09/2020 FINDINGS: Lungs are clear. Borderline cardiomegaly.  Aortic Atherosclerosis (ICD10-170.0). Chronic blunting of the left lateral costophrenic angle. No pneumothorax. Visualized bones unremarkable. IMPRESSION: Stable chronic findings.  No acute cardiopulmonary disease. Electronically Signed   By: DLucrezia EuropeM.D.   On: 10/29/2021 20:56   DG Hip Unilat With Pelvis 2-3 Views Right  Result Date: 10/29/2021 CLINICAL DATA:  Hip pain. EXAM: DG HIP (WITH OR WITHOUT PELVIS) 2-3V RIGHT COMPARISON:  CT abdomen and pelvis 12/20/2019  FINDINGS: There is no evidence of hip fracture or dislocation. Joint spaces are maintained. There are mild degenerative changes of both hips with acetabular sclerosis and osteophyte formation. There also degenerative changes of the lower lumbar spine. Soft tissues are within normal limits. IMPRESSION: 1. No acute fracture or dislocation. 2. Mild degenerative changes of both hips. Electronically Signed   By: ARonney AstersM.D.   On: 10/29/2021 18:13       The results of significant diagnostics from this hospitalization (including imaging, microbiology, ancillary and laboratory) are listed below for reference.     Microbiology: Recent Results (from the past 240 hour(s))  Culture, blood (Routine X 2) w Reflex to ID Panel     Status: None   Collection Time: 11/14/21 11:19 AM   Specimen: BLOOD  Result Value Ref Range Status   Specimen Description   Final    BLOOD RIGHT ANTECUBITAL Performed at [facility] [place] [address]   Special Requests   Final    BOTTLES DRAWN AEROBIC ONLY Blood Culture adequate volume [facility], [place] [address], [place], [place] [address]    Culture   Final    NO GROWTH 5 DAYS Performed at [facility], [place] [address] [place], [place] [address]    Report Status 11/19/2021 FINAL  Final  Culture, blood (Routine X 2) w Reflex to ID Panel     Status: None   Collection Time: 11/14/21 11:19 AM   Specimen: BLOOD  Result Value Ref Range Status   Specimen Description   Final    BLOOD BLOOD RIGHT HAND Performed at [place] [address], [place], [place] [address]    Special Requests   Final    BOTTLES DRAWN AEROBIC ONLY Blood Culture adequate volume Performed at [place] [facility] [address] [place], [place] [address]    Culture   Final    NO GROWTH 5 DAYS Performed at [facility], [place] [address], [place], [place] [address]     Report Status 11/19/2021 FINAL  Final  Urine Culture     Status: Abnormal   Collection Time: 11/19/21  3:02 PM   Specimen: Urine, Clean Catch  Result Value Ref Range Status   Specimen Description   Final    URINE, CLEAN CATCH Performed at [facility], [place] [address], [place], [place] [address]    Special Requests   Final    NONE Performed at [facility], [place] [address] [place], [place] [address]    Culture MULTIPLE SPECIES PRESENT, SUGGEST RECOLLECTION (A)  Final   Report Status 11/20/2021 FINAL  Final  Blood culture (routine x 2)     Status: Abnormal   Collection Time: 11/19/21  5:51 PM   Specimen: BLOOD  Result Value Ref Range Status   Specimen Description   Final    BLOOD SITE NOT SPECIFIED Performed at [place] [address] [place], [place] [address]    Special Requests   Final    BOTTLES DRAWN AEROBIC AND ANAEROBIC Blood Culture adequate volume Performed at [place] [address], [place], [place] [address]    Culture  Setup Time   Final    GRAM POSITIVE RODS AEROBIC BOTTLE ONLY CRITICAL RESULT CALLED TO, READ BACK BY AND VERIFIED WITH: Melodye Ped PHARMD, AT 1011 11/20/21 D. VANHOOK    Culture (A)  Final    BACILLUS SPECIES Standardized susceptibility testing for this organism is not available. Performed at [facility], [place] [address], [place], [place] [address]    Report Status 11/21/2021 FINAL  Final  Blood culture (routine x 2)     Status: None   Collection Time: 11/19/21  5:53 PM   Specimen: BLOOD  Result Value Ref Range Status   Specimen Description   Final    BLOOD SITE NOT SPECIFIED Performed at [place] [address] [place], [place] [address]    Special Requests   Final    BOTTLES DRAWN AEROBIC AND ANAEROBIC Blood Culture results may not be optimal due to an excessive volume of blood received in culture bottles Performed at [place] [address], [place], [place] [address]    Culture   Final    NO GROWTH 5 DAYS Performed at [facility], [place] [address] [place],  [address]    Report Status 11/24/2021 FINAL  Final     Labs:  CBC: Recent Labs  Lab 11/19/21 1351 11/20/21 0422  11/21/21 0439  WBC 11.2* 8.9 9.3  NEUTROABS 8.1*  --   --   HGB 12.5 12.1 12.1  HCT 37.2 36.0 36.5  MCV 99.5 98.6 98.1  PLT 350 308 298   BMP &GFR Recent Labs  Lab 11/19/21 1351 11/20/21 0422 11/21/21 0439  NA 136 137 136  K 3.9 3.9 4.0  CL 102 103 103  CO2 '25 25 25  '$ GLUCOSE 146* 135* 144*  BUN '16 13 11  '$ CREATININE 0.84 0.81 0.78  CALCIUM 8.1* 8.1* 8.1*  MG  --  2.3 2.6*  PHOS  --  3.5 3.1   Estimated Creatinine Clearance: 41.8 mL/min (by C-G formula based on SCr of 0.78 mg/dL). Liver & Pancreas: Recent Labs  Lab 11/19/21 1351 11/20/21 0422 11/21/21 0439  AST 33 23  --   ALT 118* 84*  --   ALKPHOS 55 48  --   BILITOT 1.1 0.9  --   PROT 6.1* 5.6*  --   ALBUMIN 2.9* 2.6* 2.7*   No results for input(s): "LIPASE", "AMYLASE" in the last 168 hours. Recent Labs  Lab 11/20/21 0422  AMMONIA 26   Diabetic: No results for input(s): "HGBA1C" in the last 72 hours. Recent Labs  Lab 11/19/21 1358  GLUCAP 146*   Cardiac Enzymes: Recent Labs  Lab 11/20/21 0422  CKTOTAL 25*   No results for input(s): "PROBNP" in the last 8760 hours. Coagulation Profile: No results for input(s): "INR", "PROTIME" in the last 168 hours. Thyroid Function Tests: No results for input(s): "TSH", "T4TOTAL", "FREET4", "T3FREE", "THYROIDAB" in the last 72 hours. Lipid Profile: No results for input(s): "CHOL", "HDL", "LDLCALC", "TRIG", "CHOLHDL", "LDLDIRECT" in the last 72 hours. Anemia Panel: No results for input(s): "VITAMINB12", "FOLATE", "FERRITIN", "TIBC", "IRON", "RETICCTPCT" in the last 72 hours. Urine analysis:    Component Value Date/Time   COLORURINE STRAW (A) 11/19/2021 1349    APPEARANCEUR CLOUDY (A) 11/19/2021 1349   LABSPEC 1.003 (L) 11/19/2021 1349   PHURINE 8.0 11/19/2021 1349   GLUCOSEU NEGATIVE 11/19/2021 1349   HGBUR MODERATE (A) 11/19/2021 1349   BILIRUBINUR NEGATIVE 11/19/2021 1349   KETONESUR NEGATIVE 11/19/2021 1349   PROTEINUR NEGATIVE 11/19/2021 1349   NITRITE NEGATIVE 11/19/2021 1349   LEUKOCYTESUR SMALL (A) 11/19/2021 1349   Sepsis Labs: Invalid input(s): "PROCALCITONIN", "LACTICIDVEN"   SIGNED:  Mercy Riding, MD  [facility] 11/24/2021, 10:42 AM

## 2021-11-24 NOTE — TOC Progression Note (Addendum)
Transition of Care [facility]) - Progression Note    Patient Details  Name: [name] MRN: [id number] Date of Birth: 08/03/1931  [facility] CM/SW Contact  Roseanne Kaufman, RN Phone Number: 11/24/2021, 9:56 AM  Clinical Narrative:   [name] with [name] at [facility] who has a short term SNF bed available today. This RNCM advised [name] that patient used and managed CPAP during her hospital stay. Spoke with patient's daughter [name] to advise of bed offer, and advised [name] will call for completion of admission documents. Awaiting discharge summary. Notified MD, RN. TOC will follow for PTAR transportation. Awaiting room# and report #.   - 11:52am Spoke with [name] at [facility], report can be called to [phone], room# 40, notified RN, MD and patient's daughter [name]. PTAR called, packet will be at nursing secretary desk.   TOC will continue to follow.   - 2:45pm cancelled PTAR due to patient's medical status has changed. TOC will continue to follow.  Expected Discharge Plan: [name] Barriers to Discharge: Continued Medical Work up  Expected Discharge Plan and Services Expected Discharge Plan: [place] In-house Referral: NA Discharge Planning Services: CM Consult Post Acute Care Choice: [name] Living arrangements for the past 2 months: Single Family Home Expected Discharge Date: 11/24/21                         HH Arranged: NA [place] Agency: NA         Social Determinants of Health (SDOH) Interventions    Readmission Risk Interventions    11/20/2021    4:10 PM 11/02/2021   12:11 PM  Readmission Risk Prevention Plan  Transportation Screening Complete Complete  PCP or Specialist Appt within 5-7 Days Complete Complete  Home Care Screening Complete Complete  Medication Review (RN CM) Complete Complete

## 2021-11-24 NOTE — Plan of Care (Addendum)
Id brief note   Asked by dr Cyndia Skeeters to comment on the fever   I am familiar with this patient  She has a similar admission a few days ago for exactly the same thing  She was admitted 10/17-24 with ongoing fever/ftt/ams and positive covid testing with low cycle threshold; she reported chronic dysuria and her urine grew enterococcus. She was given a couple days abx but I stopped as her symptomatology was c/w covid reinfection vs persistent infection; she has pan-body ct at that time that was unermarkable. She wasn't hypoxic so no remdesivir was given this time.   She had another admission 10/5-9 for covid infection at that time with uri sx asnd mild-moderate hypoxia given dexamethasone and remdesivir   She returned from home for failure to thrive picture again 10/26. The ed started her on empiric piptazo due to reported dysuria which she always has had. Her urine was polymicrobial. Her dysuria wasn't affected by antibiotics  She intermittently have fever here and so far blood culture this admision with preivous admisison was negative. Cxr was without active disease. Again 10/22 pan body ct with contrast unremarkable except mention of chronic interstitial marking changes  She was being discharged today when another fever without other sx or hemodynamic compromise occurred again  Repeat basic labs unremarkable. Cxr mentions lll atelectasis vs infiltrate. She has no pna sx or other localizing sx  Her procal has been normal   ------ My thoughts would be prolonged covid/recurrent covid infection (it wasn't checked this admission). Vs complication of covid like thrombosis. She so far has shown no sign of bacterial infectious syndrome.   As this approaches FUO spectrum, given normal ct of body recently I suspect she might need outpatient pet ct to r/o malignancy/lymphoma which can be normal on ct scan  I would advise deferring abx, repeat covid pcr (if cycle threshold is still < 33 this would be  consistent with ongoing covid infection) repeat blood culture, get duplex u/s of extremities (and chest ct pe protocol if symptomatic), and if all are negative would get pet ct scan outpatient  I will make her outpatient id f/u for ongoing fever evaluation if she doesn't have a pcp  Clinic Follow Up Appt: 11/10 @ 930 with dr Gale Journey  @  [facility] [place], [place], [place] [address] Phone: [phone]    Dr Johnnye Sima is available as needed if further ID input is needed  I spent more than 15 minute reviewing data/chart, and coordinating care

## 2021-11-24 NOTE — Progress Notes (Signed)
Pt had been OOB in chair most of day. She suddenly started chilling & found pt back in bed, covered with multiple blankets, & shivering. Obtain oral temp @ 102.0. Uncovered pt, notified, Dr. Cyndia Skeeters, charge nurse Myriam Jacobson RN. Repeated Rectal temp 103.5.  Administered tylenol suppository. Connected pt to [place] & called TELE. Pulse ox. Connected.  Will continue to monitor.

## 2021-11-25 ENCOUNTER — Inpatient Hospital Stay (HOSPITAL_COMMUNITY): Payer: Medicare Other

## 2021-11-25 DIAGNOSIS — M5116 Intervertebral disc disorders with radiculopathy, lumbar region: Secondary | ICD-10-CM | POA: Diagnosis not present

## 2021-11-25 DIAGNOSIS — R509 Fever, unspecified: Secondary | ICD-10-CM

## 2021-11-25 DIAGNOSIS — D7589 Other specified diseases of blood and blood-forming organs: Secondary | ICD-10-CM | POA: Diagnosis not present

## 2021-11-25 DIAGNOSIS — D75829 Heparin-induced thrombocytopenia, unspecified: Secondary | ICD-10-CM | POA: Diagnosis not present

## 2021-11-25 DIAGNOSIS — J9601 Acute respiratory failure with hypoxia: Secondary | ICD-10-CM | POA: Diagnosis not present

## 2021-11-25 DIAGNOSIS — I4892 Unspecified atrial flutter: Secondary | ICD-10-CM | POA: Diagnosis not present

## 2021-11-25 DIAGNOSIS — H5123 Internuclear ophthalmoplegia, bilateral: Secondary | ICD-10-CM | POA: Diagnosis not present

## 2021-11-25 DIAGNOSIS — G4733 Obstructive sleep apnea (adult) (pediatric): Secondary | ICD-10-CM | POA: Diagnosis not present

## 2021-11-25 DIAGNOSIS — J439 Emphysema, unspecified: Secondary | ICD-10-CM | POA: Diagnosis not present

## 2021-11-25 DIAGNOSIS — R1310 Dysphagia, unspecified: Secondary | ICD-10-CM | POA: Diagnosis not present

## 2021-11-25 DIAGNOSIS — D692 Other nonthrombocytopenic purpura: Secondary | ICD-10-CM | POA: Diagnosis not present

## 2021-11-25 DIAGNOSIS — J310 Chronic rhinitis: Secondary | ICD-10-CM | POA: Diagnosis not present

## 2021-11-25 DIAGNOSIS — N3000 Acute cystitis without hematuria: Secondary | ICD-10-CM | POA: Diagnosis not present

## 2021-11-25 DIAGNOSIS — E860 Dehydration: Secondary | ICD-10-CM | POA: Diagnosis not present

## 2021-11-25 DIAGNOSIS — H3411 Central retinal artery occlusion, right eye: Secondary | ICD-10-CM | POA: Diagnosis not present

## 2021-11-25 DIAGNOSIS — I700 Atherosclerosis of aorta: Secondary | ICD-10-CM | POA: Diagnosis not present

## 2021-11-25 DIAGNOSIS — I729 Aneurysm of unspecified site: Secondary | ICD-10-CM | POA: Diagnosis not present

## 2021-11-25 DIAGNOSIS — N1831 Chronic kidney disease, stage 3a: Secondary | ICD-10-CM | POA: Diagnosis not present

## 2021-11-25 DIAGNOSIS — R7301 Impaired fasting glucose: Secondary | ICD-10-CM | POA: Diagnosis not present

## 2021-11-25 DIAGNOSIS — H60502 Unspecified acute noninfective otitis externa, left ear: Secondary | ICD-10-CM | POA: Diagnosis not present

## 2021-11-25 DIAGNOSIS — I69320 Aphasia following cerebral infarction: Secondary | ICD-10-CM | POA: Diagnosis not present

## 2021-11-25 DIAGNOSIS — E785 Hyperlipidemia, unspecified: Secondary | ICD-10-CM | POA: Diagnosis not present

## 2021-11-25 DIAGNOSIS — E871 Hypo-osmolality and hyponatremia: Secondary | ICD-10-CM | POA: Diagnosis not present

## 2021-11-25 DIAGNOSIS — M1990 Unspecified osteoarthritis, unspecified site: Secondary | ICD-10-CM | POA: Diagnosis not present

## 2021-11-25 DIAGNOSIS — I480 Paroxysmal atrial fibrillation: Secondary | ICD-10-CM | POA: Diagnosis not present

## 2021-11-25 DIAGNOSIS — M5431 Sciatica, right side: Secondary | ICD-10-CM | POA: Diagnosis not present

## 2021-11-25 DIAGNOSIS — D6869 Other thrombophilia: Secondary | ICD-10-CM | POA: Diagnosis not present

## 2021-11-25 DIAGNOSIS — I129 Hypertensive chronic kidney disease with stage 1 through stage 4 chronic kidney disease, or unspecified chronic kidney disease: Secondary | ICD-10-CM | POA: Diagnosis not present

## 2021-11-25 LAB — PHOSPHORUS: Phosphorus: 2.8 mg/dL (ref 2.5–4.6)

## 2021-11-25 LAB — COMPREHENSIVE METABOLIC PANEL
ALT: 54 U/L — ABNORMAL HIGH (ref 0–44)
AST: 30 U/L (ref 15–41)
Albumin: 2.4 g/dL — ABNORMAL LOW (ref 3.5–5.0)
Alkaline Phosphatase: 49 U/L (ref 38–126)
Anion gap: 5 (ref 5–15)
BUN: 15 mg/dL (ref 8–23)
CO2: 23 mmol/L (ref 22–32)
Calcium: 7.8 mg/dL — ABNORMAL LOW (ref 8.9–10.3)
Chloride: 106 mmol/L (ref 98–111)
Creatinine, Ser: 0.67 mg/dL (ref 0.44–1.00)
GFR, Estimated: >60 mL/min (ref >60)
Glucose, Bld: 108 mg/dL — ABNORMAL HIGH (ref 70–99)
Potassium: 4.1 mmol/L (ref 3.5–5.1)
Sodium: 134 mmol/L — ABNORMAL LOW (ref 135–145)
Total Bilirubin: 0.8 mg/dL (ref 0.3–1.2)
Total Protein: 5.6 g/dL — ABNORMAL LOW (ref 6.5–8.1)

## 2021-11-25 LAB — CBC
HCT: 33 % — ABNORMAL LOW (ref 36.0–46.0)
Hemoglobin: 10.9 g/dL — ABNORMAL LOW (ref 12.0–15.0)
MCH: 33 pg (ref 26.0–34.0)
MCHC: 33 g/dL (ref 30.0–36.0)
MCV: 100 fL (ref 80.0–100.0)
Platelets: 281 10*3/uL (ref 150–400)
RBC: 3.3 MIL/uL — ABNORMAL LOW (ref 3.87–5.11)
RDW: 12.8 % (ref 11.5–15.5)
WBC: 10.6 10*3/uL — ABNORMAL HIGH (ref 4.0–10.5)
nRBC: 0 % (ref 0.0–0.2)

## 2021-11-25 LAB — SEDIMENTATION RATE: Sed Rate: 94 mm/hr — ABNORMAL HIGH (ref 0–22)

## 2021-11-25 LAB — D-DIMER, QUANTITATIVE: D-Dimer, Quant: 0.67 ug/mL-FEU — ABNORMAL HIGH (ref 0.00–0.50)

## 2021-11-25 LAB — C-REACTIVE PROTEIN
CRP: 9.3 mg/dL — ABNORMAL HIGH (ref <1.0)
CRP: 9.5 mg/dL — ABNORMAL HIGH (ref <1.0)

## 2021-11-25 LAB — GLUCOSE, CAPILLARY: Glucose-Capillary: 233 mg/dL — ABNORMAL HIGH (ref 70–99)

## 2021-11-25 LAB — PROCALCITONIN: Procalcitonin: 0.45 ng/mL

## 2021-11-25 LAB — MAGNESIUM: Magnesium: 2.2 mg/dL (ref 1.7–2.4)

## 2021-11-25 MED ORDER — ZINC SULFATE 220 (50 ZN) MG PO CAPS
220.0000 mg | ORAL_CAPSULE | Freq: Every day | ORAL | Status: DC
  Administered 2021-11-25 – 2021-11-26 (×2): 220 mg via ORAL
  Filled 2021-11-25 (×3): qty 1

## 2021-11-25 MED ORDER — NIRMATRELVIR/RITONAVIR (PAXLOVID)TABLET: 3.0000 | ORAL_TABLET | Freq: Two times a day (BID) | ORAL | Status: DC

## 2021-11-25 MED ORDER — SODIUM CHLORIDE 0.9 % IV SOLN
2.0000 g | INTRAVENOUS | Status: DC
  Administered 2021-11-25 – 2021-11-26 (×2): 2 g via INTRAVENOUS
  Filled 2021-11-25 (×3): qty 20

## 2021-11-25 MED ORDER — METHYLPREDNISOLONE SODIUM SUCC 40 MG IJ SOLR
0.5000 mg/kg | Freq: Two times a day (BID) | INTRAMUSCULAR | Status: DC
  Administered 2021-11-25 – 2021-11-27 (×5): 34 mg via INTRAVENOUS
  Filled 2021-11-25 (×5): qty 1

## 2021-11-25 MED ORDER — MOLNUPIRAVIR EUA 200MG CAPSULE
4.0000 | ORAL_CAPSULE | Freq: Two times a day (BID) | ORAL | Status: DC
  Administered 2021-11-25 – 2021-11-27 (×5): 800 mg via ORAL
  Filled 2021-11-25: qty 4

## 2021-11-25 MED ORDER — AZITHROMYCIN 250 MG PO TABS
500.0000 mg | ORAL_TABLET | Freq: Every day | ORAL | Status: DC
  Administered 2021-11-25 – 2021-11-27 (×3): 500 mg via ORAL
  Filled 2021-11-25 (×3): qty 2

## 2021-11-25 MED ORDER — MOLNUPIRAVIR EUA 200MG CAPSULE
4.0000 | ORAL_CAPSULE | Freq: Two times a day (BID) | ORAL | Status: DC
  Filled 2021-11-25: qty 4

## 2021-11-25 MED ORDER — VITAMIN C 500 MG PO TABS
500.0000 mg | ORAL_TABLET | Freq: Every day | ORAL | Status: DC
  Administered 2021-11-25 – 2021-11-27 (×3): 500 mg via ORAL
  Filled 2021-11-25 (×3): qty 1

## 2021-11-25 MED ORDER — PREDNISONE 50 MG PO TABS: 50.0000 mg | ORAL_TABLET | Freq: Every day | ORAL | Status: DC

## 2021-11-25 NOTE — Progress Notes (Signed)
Bilateral lower extremity venous duplex has been completed. Preliminary results can be found in CV Proc through chart review.   11/25/21 12:00 PM Carlos Levering RVT

## 2021-11-25 NOTE — Plan of Care (Signed)
  Problem: Education: Goal: Knowledge of risk factors and measures for prevention of condition will improve 11/25/2021 2342 by Lydia Guiles, RN Outcome: Progressing 11/25/2021 2342 by Lydia Guiles, RN Outcome: Progressing   Problem: Coping: Goal: Psychosocial and spiritual needs will be supported 11/25/2021 2342 by Lydia Guiles, RN Outcome: Progressing 11/25/2021 2342 by Lydia Guiles, RN Outcome: Progressing   Problem: Education: Goal: Knowledge of General Education information will improve Description: Including pain rating scale, medication(s)/side effects and non-pharmacologic comfort measures 11/25/2021 2342 by Lydia Guiles, RN Outcome: Progressing 11/25/2021 2342 by Lydia Guiles, RN Outcome: Progressing   Problem: Clinical Measurements: Goal: Respiratory complications will improve 11/25/2021 2342 by Lydia Guiles, RN Outcome: Progressing 11/25/2021 2342 by Lydia Guiles, RN Outcome: Progressing   Problem: Nutrition: Goal: Adequate nutrition will be maintained 11/25/2021 2342 by Lydia Guiles, RN Outcome: Progressing 11/25/2021 2342 by Lydia Guiles, RN Outcome: Progressing   Problem: Activity: Goal: Risk for activity intolerance will decrease 11/25/2021 2342 by Lydia Guiles, RN Outcome: Progressing 11/25/2021 2342 by Lydia Guiles, RN Outcome: Progressing   Problem: Coping: Goal: Level of anxiety will decrease 11/25/2021 2342 by Lydia Guiles, RN Outcome: Progressing 11/25/2021 2342 by Lydia Guiles, RN Outcome: Progressing   Problem: Safety: Goal: Ability to remain free from injury will improve 11/25/2021 2342 by Lydia Guiles, RN Outcome: Progressing 11/25/2021 2342 by Lydia Guiles, RN Outcome: Progressing   Problem: Skin Integrity: Goal: Risk for impaired skin integrity will decrease 11/25/2021 2342 by Lydia Guiles, RN Outcome: Progressing 11/25/2021 2342 by Lydia Guiles, RN Outcome: Progressing

## 2021-11-25 NOTE — Progress Notes (Signed)
PT refuses to use her CPAP

## 2021-11-25 NOTE — Hospital Course (Signed)
88 y.o. female with PMH of PAF on Eliquis, OSA on CPAP, CVA, MCI, HTN, HLD, recent COVID-19 infection, recurrent UTI on prophylactic antibiotics and recent hospitalization for encephalopathy and possible UTI returning with generalized weakness, "fever", chills, nausea and dysuria, and admitted for possible UTI, dehydration, generalized weakness and failure to thrive.  Started on IV fluid and IV Zosyn.  Per family, very independent and active until recent hospitalization for COVID-19.  However, some decline in cognitive function since she had stroke in 2021.   Blood culture with GPR in 1 out of 2 aerobic bottles.  Culture with multiple species.  Dysuria improved with Pyridium.  Completed 3 days of IV Zosyn.  Patient was discharged to SNF but developed chills and spiked fever to 103.5. Pt found to have evidence suggesting continued active covid. CXR also with worsening infiltrate and elevated procal.

## 2021-11-25 NOTE — Progress Notes (Signed)
Reported to oncoming nurse & placed the following information on yellow sticky note after a discussion at length "PER PHARMACY: DO NOT OPEN molnupiravir EUA (LAGEVRIO) capsules for COVID. All other meds may be crushed & put in applesauce. Pt must be sitting upright, takes one at a time in 1-2 big bites of applesauce, sitting 90 degrees" Pt easily chokes, even though swallow study suggested no issues.

## 2021-11-25 NOTE — Progress Notes (Signed)
Occupational Therapy Treatment Patient Details Name: [name] MRN: [id number] DOB: 12-02-1931 Today's Date: 11/25/2021   History of present illness 88 y.o. female with PMH of paroxysmal A-fib on Eliquis, OSA on CPAP, CVA, HTN, HLD, recent COVID-19 infection, recurrent UTI on prophylactic antibiotics. Admitted with UTI, general weakness, FTT.   OT comments  Patient was found in bed; her daughter was present throughout the session. Pt required some gentle encouragement for participation in the session, as she reported feelings of increased fatigue. She required min assist for rolling, as well as for supine to sit, and sit to stand using a RW. She took a few lateral steps towards the head of the bed using a RW, requiring assistance, as she presented with unsteadiness in standing. She was further noted to be with deconditioning and compromised endurance. She will continue to benefit from further OT services to facilitate progressive ADL performance.    Recommendations for follow up therapy are one component of a multi-disciplinary discharge planning process, led by the attending physician.  Recommendations may be updated based on patient status, additional functional criteria and insurance authorization.    Follow Up Recommendations  Skilled nursing-short term rehab (<3 hours/day)    Assistance Recommended at Discharge Frequent or constant Supervision/Assistance  Patient can return home with the following  Assistance with cooking/housework;A lot of help with walking and/or transfers;A lot of help with bathing/dressing/bathroom;Assist for transportation;Help with stairs or ramp for entrance;Direct supervision/assist for medications management   Equipment Recommendations  None recommended by OT       Precautions / Restrictions Precautions Precautions: Fall Precaution Comments: urinary incontinence at baseline Restrictions Weight Bearing Restrictions: No Other Position/Activity  Restrictions: contact/airborne       Mobility Bed Mobility Overal bed mobility: Needs Assistance Bed Mobility: Supine to Sit, Sit to Supine, Rolling Rolling: Min assist   Supine to sit: Min assist, HOB elevated Sit to supine: Mod assist        Transfers Overall transfer level: Needs assistance Equipment used: Rolling walker (2 wheels) Transfers: Sit to/from Stand Sit to Stand: Min assist, From elevated surface           General transfer comment: She was instructed on taking lateral steps towards the head of the bed using a RW, for which she required min assist and cues for advancing RW and picking B LE up off the ground further         ADL either performed or assessed with clinical judgement   ADL Overall ADL's : Needs assistance/impaired Eating/Feeding: Set up;Bed level   Grooming: Set up;Bed level           Upper Body Dressing : Moderate assistance   Lower Body Dressing: Maximal assistance            Cognition Arousal/Alertness: Awake/alert Behavior During Therapy: WFL for tasks assessed/performed, Flat affect          Following Commands: Follows one step commands with increased time                           Pertinent Vitals/ Pain       Pain Assessment Pain Assessment: No/denies pain         Frequency  Min 2X/week        Progress Toward Goals  OT Goals(current goals can now be found in the care plan section)  Progress towards OT goals: Progressing toward goals  Acute Rehab OT Goals Patient Stated Goal: to  get better OT Goal Formulation: With patient Time For Goal Achievement: 12/04/21 Potential to Achieve Goals: Good  Plan Discharge plan remains appropriate       AM-PAC OT "6 Clicks" Daily Activity     Outcome Measure   Help from another person eating meals?: None Help from another person taking care of personal grooming?: A Little Help from another person toileting, which includes using toliet, bedpan, or  urinal?: A Lot Help from another person bathing (including washing, rinsing, drying)?: A Lot Help from another person to put on and taking off regular upper body clothing?: A Lot Help from another person to put on and taking off regular lower body clothing?: A Lot 6 Click Score: 15    End of Session Equipment Utilized During Treatment: Gait belt;Rolling walker (2 wheels)  OT Visit Diagnosis: Muscle weakness (generalized) (M62.81);Unsteadiness on feet (R26.81)   Activity Tolerance Patient limited by fatigue (Fair overall tolerance)   Patient Left in bed;with call bell/phone within reach;with family/visitor present;with bed alarm set   Nurse Communication Other (comment) (Nurse cleared the pt for participation in therapy)        Time: 9509-3267 OT Time Calculation (min): 22 min  Charges: OT General Charges $OT Visit: 1 Visit OT Treatments $Therapeutic Activity: 8-22 mins     Leota Sauers, OTR/L 11/25/2021, 4:44 PM

## 2021-11-25 NOTE — Progress Notes (Signed)
Progress Note   Patient: [name] EZM:[id number] DOB: 1931/04/26 DOA: 11/19/2021     5 DOS: the patient was seen and examined on 11/25/2021   Brief hospital course: 88 y.o. female with PMH of PAF on Eliquis, OSA on CPAP, CVA, MCI, HTN, HLD, recent COVID-19 infection, recurrent UTI on prophylactic antibiotics and recent hospitalization for encephalopathy and possible UTI returning with generalized weakness, "fever", chills, nausea and dysuria, and admitted for possible UTI, dehydration, generalized weakness and failure to thrive.  Started on IV fluid and IV Zosyn.  Per family, very independent and active until recent hospitalization for COVID-19.  However, some decline in cognitive function since she had stroke in 2021.   Blood culture with GPR in 1 out of 2 aerobic bottles.  Culture with multiple species.  Dysuria improved with Pyridium.  Completed 3 days of IV Zosyn.  Patient was discharged to SNF but developed chills and spiked fever to 103.5. Pt found to have evidence suggesting continued active covid. CXR also with worsening infiltrate and elevated procal.  Assessment and Plan: Fever of unknown origin: Patient developed chills and found to be febrile to 103.5 F on 10/31.  No neuro, GI or new UTI symptoms.  Not toxic.  Hemodynamically stable.  Head CT on 10/22 without significant finding.   -Dr. Cyndia Skeeters discussed with ID -Pt covid pos, concern for continued active covid infection -Procal also elevated to 0.45 with worsening infiltrate on cxr -Started empiric rocephin with azithromycin to cover PNA. -Per ID, recs to also treat COVID. Ordered steroids and molnupiravir -Will repeat inflammatory markers in AM   Possible urinary tract infection: Completed 3 days of IV Zosyn.  Dysuria improved with Pyridium. -Encouraged to follow-up with a urologist after discharge.   Generalized weakness/failure to thrive: Family noted some cognitive decline and she had a stroke 2 years ago.  However, she  was physically active until recent hospitalization for COVID earlier this month.  She has been deconditioned since then.  She might have progressive dementia as a result of COVID.  No focal neurodeficit.  TSH within normal. -Treat treatable causes -PT/OT-recommended SNF. -OOB for meals   Dehydration: as evidenced by lactic acidosis and hemoconcentration.  Resolved.   Lactic acidosis: Resolved.   Paroxysmal A-fib: Seems to be in sinus rhythm now.  Not on rate or rhythm control medication.  She has history of tachybradycardia syndrome.   -Continue home Eliquis   Emphysema: Stable.  No respiratory symptoms -Continue as needed DuoNeb   Obstructive sleep apnea on CPAP -Nightly CPAP   History of COVID: Tested positive on 10/29/2021 -No isolation precaution.   "Squamous cell carcinoma" of the skin: Just had resection the day prior to presentation.  Has clear looking dressing over her nose. -Outpatient follow-up   Anxiety state -Continue low-dose Remeron.  Increase to 15 mg if she continues to tolerate      Subjective: Without complaints this AM  Physical Exam: Vitals:   11/25/21 1100 11/25/21 1200 11/25/21 1300 11/25/21 1341  BP: (!) 102/50 (!) 106/54 (!) 110/50 (!) 98/56  Pulse: 80  70 68  Resp: (!) 25 (!) 24 (!) 25 (!) 26  Temp:    97.8 F (36.6 C)  TempSrc:    Oral  SpO2: 95%  95% 97%  Weight:      Height:       General exam: Awake, laying in bed, in nad Respiratory system: Normal respiratory effort, no wheezing Cardiovascular system: regular rate, s1, s2 Gastrointestinal system: Soft, nondistended,  positive BS Central nervous system: CN2-12 grossly intact, strength intact Extremities: Perfused, no clubbing Skin: Normal skin turgor, no notable skin lesions seen Psychiatry: Mood normal // no visual hallucinations   Data Reviewed:  Labs reviewed: Na 134, K 4.1, Cr 0.67   Family Communication: Pt in room, family at bedside  Disposition: Status is:  Inpatient Remains inpatient appropriate because: Severity of illness  Planned Discharge Destination: Home    Author: Marylu Lund, MD 11/25/2021 6:54 PM  For on call review [email or web address].

## 2021-11-25 NOTE — Progress Notes (Signed)
1440 Administered new medications ordered., took well in applesauce. Removed sutures on nose per order. Used sterile technique with sterile gloves. Cleansed site with chlorhexidine, removed all stitches carefully, wiped again with chlorhexidine, allowed to dry & placed non-sting barrier over all, steri-strips x 3.  Removed former pur wick, Provided peri-care a second time today, applied a good amount of barrier cream & new purwick in place. Turned pt to left side, pillows behind back x 2, one between legs, & pt confirmed comfort. Family at bedside.

## 2021-11-26 DIAGNOSIS — E860 Dehydration: Secondary | ICD-10-CM | POA: Diagnosis not present

## 2021-11-26 DIAGNOSIS — R531 Weakness: Secondary | ICD-10-CM | POA: Diagnosis not present

## 2021-11-26 LAB — COMPREHENSIVE METABOLIC PANEL
ALT: 54 U/L — ABNORMAL HIGH (ref 0–44)
AST: 33 U/L (ref 15–41)
Albumin: 2.5 g/dL — ABNORMAL LOW (ref 3.5–5.0)
Alkaline Phosphatase: 50 U/L (ref 38–126)
Anion gap: 8 (ref 5–15)
BUN: 22 mg/dL (ref 8–23)
CO2: 22 mmol/L (ref 22–32)
Calcium: 8.6 mg/dL — ABNORMAL LOW (ref 8.9–10.3)
Chloride: 108 mmol/L (ref 98–111)
Creatinine, Ser: 0.72 mg/dL (ref 0.44–1.00)
GFR, Estimated: >60 mL/min (ref >60)
Glucose, Bld: 178 mg/dL — ABNORMAL HIGH (ref 70–99)
Potassium: 4.6 mmol/L (ref 3.5–5.1)
Sodium: 138 mmol/L (ref 135–145)
Total Bilirubin: 0.4 mg/dL (ref 0.3–1.2)
Total Protein: 5.9 g/dL — ABNORMAL LOW (ref 6.5–8.1)

## 2021-11-26 LAB — CBC
HCT: 31.1 % — ABNORMAL LOW (ref 36.0–46.0)
Hemoglobin: 10.2 g/dL — ABNORMAL LOW (ref 12.0–15.0)
MCH: 33 pg (ref 26.0–34.0)
MCHC: 32.8 g/dL (ref 30.0–36.0)
MCV: 100.6 fL — ABNORMAL HIGH (ref 80.0–100.0)
Platelets: 305 10*3/uL (ref 150–400)
RBC: 3.09 MIL/uL — ABNORMAL LOW (ref 3.87–5.11)
RDW: 12.9 % (ref 11.5–15.5)
WBC: 9.7 10*3/uL (ref 4.0–10.5)
nRBC: 0 % (ref 0.0–0.2)

## 2021-11-26 LAB — GLUCOSE, CAPILLARY: Glucose-Capillary: 148 mg/dL — ABNORMAL HIGH (ref 70–99)

## 2021-11-26 LAB — C-REACTIVE PROTEIN: CRP: 7.6 mg/dL — ABNORMAL HIGH (ref <1.0)

## 2021-11-26 MED ORDER — MENTHOL 3 MG MT LOZG
1.0000 | LOZENGE | OROMUCOSAL | Status: DC | PRN
  Administered 2021-11-26: 3 mg via ORAL
  Filled 2021-11-26: qty 9

## 2021-11-26 MED ORDER — IPRATROPIUM-ALBUTEROL 20-100 MCG/ACT IN AERS
1.0000 | INHALATION_SPRAY | Freq: Four times a day (QID) | RESPIRATORY_TRACT | Status: DC | PRN
  Filled 2021-11-26: qty 4

## 2021-11-26 MED ORDER — GUAIFENESIN 100 MG/5ML PO LIQD
5.0000 mL | ORAL | Status: DC | PRN
  Administered 2021-11-26 – 2021-11-27 (×2): 5 mL via ORAL
  Filled 2021-11-26 (×2): qty 10

## 2021-11-26 MED ORDER — CARBAMIDE PEROXIDE 6.5 % OT SOLN
5.0000 [drp] | Freq: Two times a day (BID) | OTIC | Status: DC
  Administered 2021-11-26 – 2021-11-27 (×3): 5 [drp] via OTIC
  Filled 2021-11-26: qty 15

## 2021-11-26 MED ORDER — LIP MEDEX EX OINT
TOPICAL_OINTMENT | CUTANEOUS | Status: DC | PRN
  Filled 2021-11-26: qty 7

## 2021-11-26 NOTE — Progress Notes (Signed)
Progress Note   Patient: [name] DOB: May 27, 1931 DOA: 11/19/2021     6 DOS: the patient was seen and examined on 11/26/2021   Brief hospital course: 88 y.o. female with PMH of PAF on Eliquis, OSA on CPAP, CVA, MCI, HTN, HLD, recent COVID-19 infection, recurrent UTI on prophylactic antibiotics and recent hospitalization for encephalopathy and possible UTI returning with generalized weakness, "fever", chills, nausea and dysuria, and admitted for possible UTI, dehydration, generalized weakness and failure to thrive.  Started on IV fluid and IV Zosyn.  Per family, very independent and active until recent hospitalization for COVID-19.  However, some decline in cognitive function since she had stroke in 2021.   Blood culture with GPR in 1 out of 2 aerobic bottles.  Culture with multiple species.  Dysuria improved with Pyridium.  Completed 3 days of IV Zosyn.  Patient was discharged to SNF but developed chills and spiked fever to 103.5. Pt found to have evidence suggesting continued active covid. CXR also with worsening infiltrate and elevated procal.  Assessment and Plan: Fever of unknown origin: Patient developed chills and found to be febrile to 103.5 F on 10/31.  No neuro, GI or new UTI symptoms.  Not toxic.  Hemodynamically stable.  Head CT on 10/22 without significant finding.   -Dr. Cyndia Skeeters discussed with ID -Pt covid pos, concern for continued active covid infection -Procal also elevated to 0.45 with worsening infiltrate on cxr -Started empiric rocephin with azithromycin to cover PNA. -Per ID, recs to also treat COVID. Now on steroids and molnupiravir -CRP down to 7.6   Possible urinary tract infection: Completed 3 days of IV Zosyn.  Dysuria improved with Pyridium. -Encouraged to follow-up with a urologist after discharge.   Generalized weakness/failure to thrive: Family noted some cognitive decline and she had a stroke 2 years ago.  However, she was physically active  until recent hospitalization for COVID earlier this month.  She has been deconditioned since then.  She might have progressive dementia as a result of COVID.  No focal neurodeficit.  TSH within normal. -Treat treatable causes -PT/OT-recommended SNF, TOC following -OOB for meals   Dehydration: as evidenced by lactic acidosis and hemoconcentration.  Resolved.   Lactic acidosis: Resolved.   Paroxysmal A-fib: Seems to be in sinus rhythm now.  Not on rate or rhythm control medication.  She has history of tachybradycardia syndrome.   -Continue home Eliquis   Emphysema: Stable.  No respiratory symptoms -Continue as needed DuoNeb   Obstructive sleep apnea on CPAP -Nightly CPAP   History of COVID: Tested positive on 10/29/2021 -No isolation precaution.   "Squamous cell carcinoma" of the skin: Just had resection the day prior to presentation.  Has clear looking dressing over her nose. -Outpatient follow-up   Anxiety state -Continue low-dose Remeron.  Increase to 15 mg if she continues to tolerate      Subjective: Complains of sensation of wax in L ear  Physical Exam: Vitals:   11/26/21 0859 11/26/21 1100 11/26/21 1130 11/26/21 1143  BP:    (!) 119/55  Pulse:    72  Resp:    18  Temp:    (!) 97.3 F (36.3 C)  TempSrc:      SpO2: 95% (!) 89% 97% 99%  Weight:      Height:       General exam: Conversant, in no acute distress Respiratory system: normal chest rise, clear, no audible wheezing Cardiovascular system: regular rhythm, s1-s2 Gastrointestinal system: Nondistended, nontender,  pos BS Central nervous system: No seizures, no tremors Extremities: No cyanosis, no joint deformities Skin: No rashes, no pallor Psychiatry: Affect normal // no auditory hallucinations   Data Reviewed:  Labs reviewed: Na 138, K 4.6, Cr 0.72   Family Communication: Pt in room, family at bedside  Disposition: Status is: Inpatient Remains inpatient appropriate because: Severity of illness   Planned Discharge Destination: Skilled nursing facility    Author: Marylu Lund, MD 11/26/2021 5:14 PM  For on call review [email or web address].

## 2021-11-26 NOTE — Progress Notes (Signed)
Pt continues to refuse CPAP qhs.  Her home machine remains in the room on standby.

## 2021-11-26 NOTE — TOC Progression Note (Signed)
Transition of Care [facility]) - Progression Note    Patient Details  Name: [name] MRN: [id number] Date of Birth: 09-Jul-1931  Transition of Care [facility]) CM/SW [place], RN Phone Number: 11/26/2021, 7:42 AM  Clinical Narrative:   Notified Lexine Baton with [facility] re: patient active COVID diagnosis. [facility] SNF can accept patient today is medically stable, will provide isolation/ negative pressure machine in room. Notified MD. TOC will continue to follow for discharge summary.      Expected Discharge Plan: [place] Barriers to Discharge: Continued Medical Work up  Expected Discharge Plan and Services Expected Discharge Plan: [place] In-house Referral: NA Discharge Planning Services: CM Consult Post Acute Care Choice: [place] Living arrangements for the past 2 months: Single Family Home Expected Discharge Date: 11/24/21                         HH Arranged: NA [place] Agency: NA         Social Determinants of Health (SDOH) Interventions    Readmission Risk Interventions    11/20/2021    4:10 PM 11/02/2021   12:11 PM  Readmission Risk Prevention Plan  Transportation Screening Complete Complete  PCP or Specialist Appt within 5-7 Days Complete Complete  Home Care Screening Complete Complete  Medication Review (RN CM) Complete Complete

## 2021-11-27 DIAGNOSIS — E860 Dehydration: Secondary | ICD-10-CM | POA: Diagnosis not present

## 2021-11-27 DIAGNOSIS — N10 Acute tubulo-interstitial nephritis: Secondary | ICD-10-CM

## 2021-11-27 DIAGNOSIS — N3000 Acute cystitis without hematuria: Secondary | ICD-10-CM | POA: Diagnosis not present

## 2021-11-27 LAB — COMPREHENSIVE METABOLIC PANEL
ALT: 54 U/L — ABNORMAL HIGH (ref 0–44)
AST: 31 U/L (ref 15–41)
Albumin: 2.7 g/dL — ABNORMAL LOW (ref 3.5–5.0)
Alkaline Phosphatase: 53 U/L (ref 38–126)
Anion gap: 9 (ref 5–15)
BUN: 19 mg/dL (ref 8–23)
CO2: 23 mmol/L (ref 22–32)
Calcium: 8.5 mg/dL — ABNORMAL LOW (ref 8.9–10.3)
Chloride: 107 mmol/L (ref 98–111)
Creatinine, Ser: 0.65 mg/dL (ref 0.44–1.00)
GFR, Estimated: >60 mL/min (ref >60)
Glucose, Bld: 189 mg/dL — ABNORMAL HIGH (ref 70–99)
Potassium: 4.3 mmol/L (ref 3.5–5.1)
Sodium: 139 mmol/L (ref 135–145)
Total Bilirubin: 0.3 mg/dL (ref 0.3–1.2)
Total Protein: 6 g/dL — ABNORMAL LOW (ref 6.5–8.1)

## 2021-11-27 LAB — CBC
HCT: 29.9 % — ABNORMAL LOW (ref 36.0–46.0)
Hemoglobin: 9.7 g/dL — ABNORMAL LOW (ref 12.0–15.0)
MCH: 32.7 pg (ref 26.0–34.0)
MCHC: 32.4 g/dL (ref 30.0–36.0)
MCV: 100.7 fL — ABNORMAL HIGH (ref 80.0–100.0)
Platelets: 380 10*3/uL (ref 150–400)
RBC: 2.97 MIL/uL — ABNORMAL LOW (ref 3.87–5.11)
RDW: 13 % (ref 11.5–15.5)
WBC: 20.6 10*3/uL — ABNORMAL HIGH (ref 4.0–10.5)
nRBC: 0 % (ref 0.0–0.2)

## 2021-11-27 LAB — C-REACTIVE PROTEIN: CRP: 5 mg/dL — ABNORMAL HIGH (ref <1.0)

## 2021-11-27 MED ORDER — PREDNISONE 10 MG PO TABS: 50.0000 mg | ORAL_TABLET | Freq: Every day | ORAL | 0 refills | Status: AC

## 2021-11-27 MED ORDER — IPRATROPIUM-ALBUTEROL 0.5-2.5 (3) MG/3ML IN SOLN
3.0000 mL | RESPIRATORY_TRACT | Status: DC | PRN
  Administered 2021-11-27: 3 mL via RESPIRATORY_TRACT
  Filled 2021-11-27: qty 3

## 2021-11-27 MED ORDER — AZITHROMYCIN 250 MG PO TABS: ORAL_TABLET | ORAL | 0 refills | Status: AC

## 2021-11-27 MED ORDER — MOLNUPIRAVIR EUA 200MG CAPSULE: 4.0000 | ORAL_CAPSULE | Freq: Two times a day (BID) | ORAL | 0 refills | Status: AC

## 2021-11-27 MED ORDER — CEFDINIR 300 MG PO CAPS: 300.0000 mg | ORAL_CAPSULE | Freq: Two times a day (BID) | ORAL | 0 refills | Status: AC

## 2021-11-27 NOTE — Progress Notes (Signed)
Pt is discharging this evening and her son has thus packed up her CPAP to be set back up when she arrives at [facility]

## 2021-11-27 NOTE — TOC Transition Note (Addendum)
Transition of Care [facility]) - CM/SW Discharge Note   Patient Details  Name: [name] MRN: [id number] Date of Birth: [birth year]  [facility] CM/SW Contact:  Leeroy Cha, RN Phone Number: 11/27/2021, 1:08 PM   Clinical Narrative:    Patient dcd to return to [facility] tct-nikki message left to this affect. Will await call back or text . Okay to return from [facility] will have room number when dc summary sent [facility] transfer report and dc summary sent to [facility] via the hub. [facility] called at [phone] for transport.  Final next level of care: Skilled Nursing Facility Barriers to Discharge: Barriers Resolved   Patient Goals and CMS Choice Patient states their goals for this hospitalization and ongoing recovery are:: Rehab at [facility] CMS Medicare.gov Compare Post Acute Care list provided to:: Patient Represenative (must comment) Seth Bake (daughter)) Choice offered to / list presented to : Patient, Adult Children Rande Lawman (daughter))  Discharge Placement                       Discharge Plan and Services In-house Referral: NA Discharge Planning Services: CM Consult Post Acute Care Choice: [place] Arranged: NA [facility] Agency: NA        Social Determinants of Health (SDOH) Interventions     Readmission Risk Interventions   Row Labels 11/20/2021    4:10 PM 11/02/2021   12:11 PM  Readmission Risk Prevention Plan   Section Header. No data exists in this row.    Transportation Screening   Complete Complete  PCP or Specialist Appt within 5-7 Days   Complete Complete  Home Care Screening   Complete Complete  Medication Review (RN CM)   Complete Complete

## 2021-11-27 NOTE — Discharge Summary (Signed)
Physician Discharge Summary  [name] VWU:[id number] DOB: 12/11/1931 DOA: 11/19/2021  PCP: Crist Infante, MD  Admit date: 11/19/2021 Discharge date: 11/27/2021 Admitted From: Home Disposition: SNF Recommendations for Outpatient Follow-up:  Follow up with PCP and urology in 1 to 2 weeks Check BMP and CBC in 1 to 2 weeks   Discharge Condition: Stable CODE STATUS: Full code  Contact information for follow-up providers     Bjorn Loser, MD. Schedule an appointment as soon as possible for a visit in 1 week(s).   Specialty: Urology Contact information: [address] [place] [address] [phone]              Contact information for after-discharge care     Destination     [facility] Preferred SNF .   Service: Skilled Nursing Contact information: [address] [place] [facility] 88 y.o. female with PMH of PAF on Eliquis, OSA on CPAP, CVA, MCI, HTN, HLD, recent COVID-19 infection, recurrent UTI on prophylactic antibiotics and recent hospitalization for encephalopathy and possible UTI returning with generalized weakness, "fever", chills, nausea and dysuria, and admitted for possible UTI, dehydration, generalized weakness and failure to thrive.  Started on IV fluid and IV Zosyn.  Per family, very independent and active until recent hospitalization for COVID-19.  However, some decline in cognitive function since she had stroke in 2021.   Blood culture with GPR in 1 out of 2 aerobic bottles.  Culture with multiple species.  Dysuria improved with Pyridium.  Completed 3 days of IV Zosyn.  Patient was discharged to SNF but developed chills and spiked fever to 103.5. Pt found to have evidence suggesting continued active covid. CXR also with worsening infiltrate and elevated procal.  See individual problem list below for more.   Problems addressed during this  hospitalization Principal Problem:   UTI (urinary tract infection) Active Problems:   PAF (paroxysmal atrial fibrillation) (HCC)   Essential hypertension   Emphysema of lung (HCC)   Obstructive sleep apnea   Generalized weakness   Hyperglycemia   Lactic acidosis   History of COVID-19   Dehydration   Failure to thrive in adult   Cognitive decline   Fever of unknown origin: Patient developed chills and found to be febrile to 103.5 F on 10/31.  No neuro, GI or new UTI symptoms.  Not toxic.  Hemodynamically stable.  Head CT on 10/22 without significant finding.   -Dr. Cyndia Skeeters discussed with ID -Pt covid pos, concern for continued active covid infection -Procal also elevated to 0.45 with worsening infiltrate on cxr -Started empiric rocephin with azithromycin to cover PNA, transitioned to azithromycin with omnicef on discharge -Per ID, recs to also treat COVID. Now on steroids (7 more days of prednisone) and molnupiravir x 5 days (to complete on 11/6) -CRP now trending down   Possible urinary tract infection: Completed 3 days of IV Zosyn.  Dysuria improved with Pyridium. -Encouraged to follow-up with a urologist after discharge.   Generalized weakness/failure to thrive: Family noted some cognitive decline and she had a stroke 2 years ago.  However, she was physically active until recent hospitalization for COVID earlier this month.  She has been deconditioned since then.  She might have progressive dementia as a result of COVID.  No focal neurodeficit.  TSH within normal. -Treat treatable causes -PT/OT-recommended SNF -  OOB for meals   Dehydration: as evidenced by lactic acidosis and hemoconcentration.  Resolved.   Lactic acidosis: Resolved.   Paroxysmal A-fib: Seems to be in sinus rhythm now.  Not on rate or rhythm control medication.  She has history of tachybradycardia syndrome.   -Continue home Eliquis   Emphysema: Stable.  No respiratory symptoms -Continue as needed DuoNeb    Obstructive sleep apnea on CPAP -Nightly CPAP   History of COVID: Tested positive on 10/29/2021 -No isolation precaution.   "Squamous cell carcinoma" of the skin: Just had resection the day prior to presentation.  Has clear looking dressing over her nose. -Outpatient follow-up   Anxiety state -Continued low-dose Remeron.    Vital signs Vitals:   11/26/21 1130 11/26/21 1143 11/27/21 0224 11/27/21 0621  BP:  (!) 119/55 (!) 113/55 136/65  Pulse:  72 72 71  Temp:  (!) 97.3 F (36.3 C) (!) 97.2 F (36.2 C) (!) 97.5 F (36.4 C)  Resp:  '18 18 16  '$ Height:      Weight:      SpO2: 97% 99% 97% 96%  TempSrc:   Oral Oral  BMI (Calculated):         Discharge exam  GENERAL: No apparent distress.  Nontoxic. HEENT: MMM.  Vision and hearing grossly intact.  NECK: Supple.  No apparent JVD.  RESP:  No IWOB.  Fair aeration bilaterally. CVS:  RRR. Heart sounds normal.  ABD/GI/GU: BS+. Abd soft, NTND.  MSK/EXT:  Moves extremities. No apparent deformity. No edema.  SKIN: no apparent skin lesion or wound NEURO: Awake and alert. Oriented appropriately.  No apparent focal neuro deficit. PSYCH: Calm. Normal affect.   Discharge Instructions  Allergies as of 11/27/2021       Reactions   Bee Venom Anaphylaxis   Mixed Vespid Venom Anaphylaxis   Codeine Other (See Comments)   Exact allergic reaction not known   Sulfa Antibiotics Itching, Nausea Only   Erythromycin Nausea And Vomiting        Medication List     STOP taking these medications    atorvastatin 10 MG tablet Commonly known as: LIPITOR   cephALEXin 250 MG capsule Commonly known as: KEFLEX   CRANBERRY PO   FIBER ADULT GUMMIES PO   guaiFENesin-dextromethorphan 100-10 MG/5ML syrup Commonly known as: ROBITUSSIN DM   oxyCODONE 5 MG immediate release tablet Commonly known as: Oxy IR/ROXICODONE   VITAMIN B12 PO       TAKE these medications    acetaminophen 325 MG tablet Commonly known as: TYLENOL Take 2 tablets  (650 mg total) by mouth every 6 (six) hours as needed for mild pain, fever or headache.   amLODipine 2.5 MG tablet Commonly known as: NORVASC Take 1 tablet (2.5 mg total) by mouth daily.   apixaban 5 MG Tabs tablet Commonly known as: Eliquis Take 1 tablet (5 mg total) by mouth 2 (two) times daily.   ascorbic acid 500 MG tablet Commonly known as: VITAMIN C Take 1 tablet (500 mg total) by mouth daily.   azithromycin 250 MG tablet Commonly known as: ZITHROMAX 1 tab po daily x 3 more days, zero refills Start taking on: November 28, 2021   cefdinir 300 MG capsule Commonly known as: OMNICEF Take 1 capsule (300 mg total) by mouth 2 (two) times daily for 3 days. Start taking on: November 28, 2021   Gemtesa 75 MG Tabs Generic drug: Vibegron Take 75 mg by mouth daily.   Ipratropium-Albuterol 20-100 MCG/ACT Aers respimat Commonly  known as: COMBIVENT Inhale 1 puff into the lungs every 6 (six) hours as needed for wheezing or shortness of breath.   mirtazapine 7.5 MG tablet Commonly known as: REMERON Take 1 tablet (7.5 mg total) by mouth at bedtime.   molnupiravir EUA 200 mg Caps capsule Commonly known as: LAGEVRIO Take 4 capsules (800 mg total) by mouth 2 (two) times daily for 5 days.   ondansetron 4 MG tablet Commonly known as: ZOFRAN Take 1 tablet (4 mg total) by mouth every 6 (six) hours as needed for nausea.   [facility] Colon Health Caps Take 1 capsule by mouth daily.   polyethylene glycol 17 g packet Commonly known as: MIRALAX / GLYCOLAX Take 17 g by mouth 2 (two) times daily as needed for mild constipation.   SENIOR VITES PO Take 1 tablet by mouth daily with breakfast.   senna-docusate 8.6-50 MG tablet Commonly known as: Senokot-S Take 1 tablet by mouth 2 (two) times daily as needed for moderate constipation.   vitamin D3 50 MCG (2000 UT) Caps Take 2,000 Units by mouth at bedtime.   zinc sulfate 220 (50 Zn) MG capsule Take 1 capsule (220 mg total) by mouth daily.         Consultations: ID  Procedures/Studies:   VAS [place] LOWER EXTREMITY VENOUS (DVT)  Result Date: 11/25/2021  Lower Venous DVT Study Patient Name:  SHATIMA ZALAR  Date of Exam:   11/25/2021 Medical Rec #: [id number]      Accession #:    [id number] Date of Birth: 26-May-1931      Patient Gender: F Patient Age:   3 years Exam Location:  [facility] Procedure:      VAS [place] LOWER EXTREMITY VENOUS (DVT) Referring Phys: Bretta Bang GONFA --------------------------------------------------------------------------------  Indications: FUO.  Risk Factors: COVID 19 positive. Comparison Study: No prior studies. Performing Technologist: Oliver Hum RVT  Examination Guidelines: A complete evaluation includes B-mode imaging, spectral Doppler, color Doppler, and power Doppler as needed of all accessible portions of each vessel. Bilateral testing is considered an integral part of a complete examination. Limited examinations for reoccurring indications may be performed as noted. The reflux portion of the exam is performed with the patient in reverse Trendelenburg.  +---------+---------------+---------+-----------+----------+--------------+ RIGHT    CompressibilityPhasicitySpontaneityPropertiesThrombus Aging +---------+---------------+---------+-----------+----------+--------------+ CFV      Full           Yes      Yes                                 +---------+---------------+---------+-----------+----------+--------------+ SFJ      Full                                                        +---------+---------------+---------+-----------+----------+--------------+ FV Prox  Full                                                        +---------+---------------+---------+-----------+----------+--------------+ FV Mid   Full                                                        +---------+---------------+---------+-----------+----------+--------------+  FV DistalFull                                                         +---------+---------------+---------+-----------+----------+--------------+ PFV      Full                                                        +---------+---------------+---------+-----------+----------+--------------+ POP      Full           Yes      Yes                                 +---------+---------------+---------+-----------+----------+--------------+ PTV      Full                                                        +---------+---------------+---------+-----------+----------+--------------+ PERO     Full                                                        +---------+---------------+---------+-----------+----------+--------------+   +---------+---------------+---------+-----------+----------+--------------+ LEFT     CompressibilityPhasicitySpontaneityPropertiesThrombus Aging +---------+---------------+---------+-----------+----------+--------------+ CFV      Full           Yes      Yes                                 +---------+---------------+---------+-----------+----------+--------------+ SFJ      Full                                                        +---------+---------------+---------+-----------+----------+--------------+ FV Prox  Full                                                        +---------+---------------+---------+-----------+----------+--------------+ FV Mid   Full                                                        +---------+---------------+---------+-----------+----------+--------------+ FV DistalFull                                                        +---------+---------------+---------+-----------+----------+--------------+  PFV      Full                                                        +---------+---------------+---------+-----------+----------+--------------+ POP      Full           Yes      Yes                                  +---------+---------------+---------+-----------+----------+--------------+ PTV      Full                                                        +---------+---------------+---------+-----------+----------+--------------+ PERO     Full                                                        +---------+---------------+---------+-----------+----------+--------------+     Summary: RIGHT: - There is no evidence of deep vein thrombosis in the lower extremity.  - No cystic structure found in the popliteal fossa.  LEFT: - There is no evidence of deep vein thrombosis in the lower extremity.  - No cystic structure found in the popliteal fossa.  *See table(s) above for measurements and observations. Electronically signed by Monica Martinez MD on 11/25/2021 at 12:10:37 PM.    Final    DG Chest Port 1 View  Result Date: 11/24/2021 CLINICAL DATA:  Hypoxia.  History of smoking and AFib EXAM: PORTABLE CHEST 1 VIEW COMPARISON:  11/19/21 FINDINGS: No pleural effusion. No pneumothorax. Increased left basilar pulmonary opacity compared to prior exam. Cardiac and mediastinal contours are unchanged. No displaced rib fractures. Visualized upper abdomen is unremarkable. IMPRESSION: Increased left basilar pulmonary opacity compared to prior exam, which may represent atelectasis or infection. Electronically Signed   By: Marin Roberts M.D.   On: 11/24/2021 15:48   DG Chest Portable 1 View  Result Date: 11/19/2021 CLINICAL DATA:  Altered mental status EXAM: PORTABLE CHEST 1 VIEW COMPARISON:  Chest 11/14/2021 FINDINGS: The heart size and mediastinal contours are within normal limits. Both lungs are clear. The visualized skeletal structures are unremarkable. Improvement in bilateral airspace disease which may have been Hartman edema previously. IMPRESSION: No active disease. Electronically Signed   By: Franchot Gallo M.D.   On: 11/19/2021 14:26   CT CHEST ABDOMEN PELVIS W CONTRAST  Result Date: 11/15/2021 CLINICAL  DATA:  Sepsis, fever EXAM: CT CHEST, ABDOMEN, AND PELVIS WITH CONTRAST TECHNIQUE: Multidetector CT imaging of the chest, abdomen and pelvis was performed following the standard protocol during bolus administration of intravenous contrast. RADIATION DOSE REDUCTION: This exam was performed according to the departmental dose-optimization program which includes automated exposure control, adjustment of the mA and/or kV according to patient size and/or use of iterative reconstruction technique. CONTRAST:  169m OMNIPAQUE IOHEXOL 300 MG/ML  SOLN COMPARISON:  Previous studies including CT abdomen and pelvis done on 10/29/2021, CT chest done on 01/21/2020 FINDINGS: CT  CHEST FINDINGS Cardiovascular: Major vascular structures are unremarkable. Heart is enlarged in size. Calcifications are noted in mitral annulus. Mediastinum/Nodes: There are slightly enlarged lymph nodes in mediastinum with no significant interval change. Lungs/Pleura: Centrilobular and panlobular emphysema is seen. Increased interstitial markings are seen in the periphery of both lungs, more so in left upper lobe. Findings suggest chronic interstitial lung disease with scarring. There is no focal pulmonary consolidation. There is no pleural effusion or pneumothorax. There is small plaque-like pleural density in the posterolateral aspect of right lower lung field which has not changed, possibly scarring. Musculoskeletal: No acute findings are seen in bony structures in the thorax. CT ABDOMEN PELVIS FINDINGS Hepatobiliary: There is fatty infiltration in liver. There is 9 mm low-density in the right lobe of liver with no change, possibly a cyst. There is no dilation of bile ducts. Gallbladder is not distended. There is no fluid around the gallbladder. Pancreas: No focal abnormalities are seen. Spleen: Unremarkable. Adrenals/Urinary Tract: Adrenals are unremarkable. There is no hydronephrosis. There are no renal or ureteral stones. Urinary bladder is  unremarkable. Stomach/Bowel: Stomach is distended with fluid in the lumen. There is no focal wall thickening in stomach. There is no significant small bowel dilation. Appendix is not dilated. There is no significant wall thickening in colon. Scattered diverticula seen in colon without signs of focal diverticulitis. Vascular/Lymphatic: Atherosclerotic plaques and calcifications are seen in aorta and its major branches. Reproductive: Uterus is not seen. Other: There is no ascites or pneumoperitoneum. Musculoskeletal: Slight decrease in height of body of L2 vertebra appears stable. There is fusion of bodies L3 and L4 vertebrae. Degenerative changes are noted with disc space narrowing, bony spurs and facet hypertrophy in the lumbar spine and lower thoracic spine. IMPRESSION: No acute findings are seen in CT scan of chest, abdomen and pelvis. There is no focal pulmonary consolidation. There is no pleural effusion. Chronic interstitial lung disease with scarring. There is no evidence of intestinal obstruction or pneumoperitoneum. There is no hydronephrosis. Appendix is not dilated. Fatty liver. Scattered diverticula are seen in colon without signs of focal diverticulitis. Arteriosclerosis. Lumbar spondylosis. Other findings as described in the body of the report. Electronically Signed   By: Elmer Picker M.D.   On: 11/15/2021 14:56   DG Chest Port 1 View  Result Date: 11/14/2021 CLINICAL DATA:  88 year old female with history of shortness of breath. EXAM: PORTABLE CHEST 1 VIEW COMPARISON:  Chest x-ray 11/12/2021. FINDINGS: Widespread but patchy areas of interstitial prominence and peribronchial cuffing are noted throughout the lungs bilaterally, most severe in the medial aspect of the right lung base and in the periphery of the left upper lobe near the apex. No confluent consolidative airspace disease. No pleural effusions. No pneumothorax. No evidence of pulmonary edema. Heart size is borderline enlarged.  Upper mediastinal contours are within normal limits. Atherosclerotic calcifications are noted in the thoracic aorta. IMPRESSION: 1. Increasing areas of interstitial prominence and peribronchial cuffing asymmetrically distributed in the lungs bilaterally concerning for progressive bronchitis, potentially with developing multilobar bilateral bronchopneumonia. 2. Aortic atherosclerosis. Electronically Signed   By: Vinnie Langton M.D.   On: 11/14/2021 06:31   DG Chest 1 View  Result Date: 11/12/2021 CLINICAL DATA:  Shortness of breath. EXAM: CHEST  1 VIEW COMPARISON:  One-view chest x-ray 11/10/2021 FINDINGS: Patient is rotated the left since the prior exam. This exaggerates the heart size. Mild pulmonary vascular congestion is increased slightly. No focal airspace disease is present. Remote right-sided rib fractures noted. IMPRESSION: Slight  increase in mild pulmonary vascular congestion. Electronically Signed   By: San Morelle M.D.   On: 11/12/2021 15:38   DG Chest Port 1 View  Result Date: 11/10/2021 CLINICAL DATA:  Shortness of breath EXAM: PORTABLE CHEST 1 VIEW COMPARISON:  11/01/2021 FINDINGS: The heart size and mediastinal contours are within normal limits. Aortic atherosclerosis. No focal airspace consolidation, pleural effusion, or pneumothorax. The visualized skeletal structures are unremarkable. IMPRESSION: No active disease. Electronically Signed   By: Davina Poke D.O.   On: 11/10/2021 10:13   DG CHEST PORT 1 VIEW  Result Date: 11/01/2021 CLINICAL DATA:  COVID, dyspnea EXAM: PORTABLE CHEST 1 VIEW COMPARISON:  Prior chest x-ray 10/29/2021 FINDINGS: Stable cardiac and mediastinal contours. Atherosclerotic calcifications again noted in the transverse aorta. Similar appearance of hazy airspace opacity overlying the left lung base. The remainder of the lungs are clear. No pneumothorax. Stable chronic bronchitic changes. IMPRESSION: Stable appearance of the chest with probable small  layering left pleural effusion and associated left basilar atelectasis versus infiltrate. Electronically Signed   By: Jacqulynn Cadet M.D.   On: 11/01/2021 07:29   CT Head Wo Contrast  Result Date: 10/29/2021 CLINICAL DATA:  Altered mental status EXAM: CT HEAD WITHOUT CONTRAST TECHNIQUE: Contiguous axial images were obtained from the base of the skull through the vertex without intravenous contrast. RADIATION DOSE REDUCTION: This exam was performed according to the departmental dose-optimization program which includes automated exposure control, adjustment of the mA and/or kV according to patient size and/or use of iterative reconstruction technique. COMPARISON:  06/09/2020 FINDINGS: Brain: No evidence of acute infarction, hemorrhage, hydrocephalus, extra-axial collection or mass lesion/mass effect. Mild atrophic and chronic white matter ischemic changes are noted. Vascular: No hyperdense vessel or unexpected calcification. Skull: Normal. Negative for fracture or focal lesion. Sinuses/Orbits: No acute finding. Other: None. IMPRESSION: Chronic atrophic and ischemic changes without acute abnormality. Electronically Signed   By: Inez Catalina M.D.   On: 10/29/2021 21:13   CT Abdomen Pelvis W Contrast  Result Date: 10/29/2021 CLINICAL DATA:  Sepsis EXAM: CT ABDOMEN AND PELVIS WITH CONTRAST TECHNIQUE: Multidetector CT imaging of the abdomen and pelvis was performed using the standard protocol following bolus administration of intravenous contrast. RADIATION DOSE REDUCTION: This exam was performed according to the departmental dose-optimization program which includes automated exposure control, adjustment of the mA and/or kV according to patient size and/or use of iterative reconstruction technique. CONTRAST:  5m OMNIPAQUE IOHEXOL 300 MG/ML  SOLN COMPARISON:  12/20/2019 FINDINGS: Lower chest: No acute abnormality.  Small hiatal hernia Hepatobiliary: Tiny probable cyst within the right hepatic dome. Liver  otherwise unremarkable. Gallbladder unremarkable. No intra or extrahepatic biliary ductal dilation. Pancreas: Unremarkable Spleen: Unremarkable Adrenals/Urinary Tract: Adrenal glands are unremarkable. Kidneys are normal, without renal calculi, focal lesion, or hydronephrosis. Bladder is unremarkable. Stomach/Bowel: Moderate sigmoid diverticulosis. The stomach, small bowel, and large bowel are otherwise unremarkable. No evidence of obstruction or focal inflammation. Appendix normal. No free intraperitoneal gas or fluid. Vascular/Lymphatic: Aortic atherosclerosis. No enlarged abdominal or pelvic lymph nodes. Reproductive: Status post hysterectomy. No adnexal masses. Other: No abdominal wall hernia or abnormality. No abdominopelvic ascites. Musculoskeletal: Osseous structures are age-appropriate. No acute bone abnormality. No lytic or blastic bone lesion. IMPRESSION: 1. No acute intra-abdominal pathology identified. No definite radiographic explanation for the patient's reported sepsis. 2. Small hiatal hernia. 3. Moderate sigmoid diverticulosis without superimposed acute inflammatory change. Aortic Atherosclerosis (ICD10-I70.0). Electronically Signed   By: AFidela SalisburyM.D.   On: 10/29/2021 20:58   DG Chest  1 View  Result Date: 10/29/2021 CLINICAL DATA:  Pt BIBA from home. Pt c/o back and shoulder pain- has had MRI- was dx with sciatica. Pt has been taking prednisone and tramadol with minimal releif, but has been able to perform ADLs. EXAM: CHEST  1 VIEW COMPARISON:  06/09/2020 FINDINGS: Lungs are clear. Borderline cardiomegaly.  Aortic Atherosclerosis (ICD10-170.0). Chronic blunting of the left lateral costophrenic angle. No pneumothorax. Visualized bones unremarkable. IMPRESSION: Stable chronic findings.  No acute cardiopulmonary disease. Electronically Signed   By: Lucrezia Europe M.D.   On: 10/29/2021 20:56   DG Hip Unilat With Pelvis 2-3 Views Right  Result Date: 10/29/2021 CLINICAL DATA:  Hip pain. EXAM: DG  HIP (WITH OR WITHOUT PELVIS) 2-3V RIGHT COMPARISON:  CT abdomen and pelvis 12/20/2019 FINDINGS: There is no evidence of hip fracture or dislocation. Joint spaces are maintained. There are mild degenerative changes of both hips with acetabular sclerosis and osteophyte formation. There also degenerative changes of the lower lumbar spine. Soft tissues are within normal limits. IMPRESSION: 1. No acute fracture or dislocation. 2. Mild degenerative changes of both hips. Electronically Signed   By: Ronney Asters M.D.   On: 10/29/2021 18:13       The results of significant diagnostics from this hospitalization (including imaging, microbiology, ancillary and laboratory) are listed below for reference.     Microbiology: Recent Results (from the past 240 hour(s))  Urine Culture     Status: Abnormal   Collection Time: 11/19/21  3:02 PM   Specimen: Urine, Clean Catch  Result Value Ref Range Status   Specimen Description   Final    URINE, CLEAN CATCH Performed at [facility], [place] [address], [place], [place] [address]    Special Requests   Final    NONE Performed at [facility], [place] [address] [place], [place] [address]    Culture MULTIPLE SPECIES PRESENT, SUGGEST RECOLLECTION (A)  Final   Report Status 11/20/2021 FINAL  Final  Blood culture (routine x 2)     Status: Abnormal   Collection Time: 11/19/21  5:51 PM   Specimen: BLOOD  Result Value Ref Range Status   Specimen Description   Final    BLOOD SITE NOT SPECIFIED Performed at [place] [address], [place], [place] [address]    Special Requests   Final    BOTTLES DRAWN AEROBIC AND ANAEROBIC Blood Culture adequate volume Performed at [place] [address], [place], [place] [address]    Culture  Setup Time   Final    GRAM POSITIVE RODS AEROBIC BOTTLE ONLY CRITICAL RESULT CALLED TO, READ BACK BY AND VERIFIED WITH: Melodye Ped PHARMD, AT 1011 11/20/21  D. VANHOOK    Culture (A)  Final    BACILLUS SPECIES Standardized susceptibility testing for this organism is not available. Performed at [facility], [place] [address], [place], [place] [address]    Report Status 11/21/2021 FINAL  Final  Blood culture (routine x 2)     Status: None   Collection Time: 11/19/21  5:53 PM   Specimen: BLOOD  Result Value Ref Range Status   Specimen Description   Final    BLOOD SITE NOT SPECIFIED Performed at [place] [address], [place], [place] [address]    Special Requests   Final    BOTTLES DRAWN AEROBIC AND ANAEROBIC Blood Culture results may not be optimal due to an excessive volume of blood received  in culture bottles Performed at [facility], [place] [address] [place], [place] [address]    Culture   Final    NO GROWTH 5 DAYS Performed at [facility], [place] [address], [place], [place] [address]    Report Status 11/24/2021 FINAL  Final  Culture, blood (Routine X 2) w Reflex to ID Panel     Status: None (Preliminary result)   Collection Time: 11/24/21  3:37 PM   Specimen: BLOOD RIGHT ARM  Result Value Ref Range Status   Specimen Description   Final    BLOOD RIGHT ARM Performed at [facility], [place] [address], [place], [place] [address]    Special Requests   Final    BOTTLES DRAWN AEROBIC AND ANAEROBIC Blood Culture adequate volume Performed at [place] [address] [place], [place] [address]    Culture   Final    NO GROWTH 3 DAYS Performed at [facility], [place] [address], [place], [place] [address]    Report Status PENDING  Incomplete  Culture, blood (Routine X 2) w Reflex to ID Panel     Status: None (Preliminary result)   Collection Time: 11/24/21  3:42 PM   Specimen: BLOOD  Result Value Ref Range Status   Specimen Description   Final    BLOOD BLOOD RIGHT HAND Performed at [place] [address], [place],  [place] [address]    Special Requests   Final    BOTTLES DRAWN AEROBIC AND ANAEROBIC Blood Culture adequate volume Performed at [place] [address], [place], [place] [address]    Culture   Final    NO GROWTH 3 DAYS Performed at [facility], [place] [address], [place], [place] [address]    Report Status PENDING  Incomplete  SARS Coronavirus 2 by RT PCR (hospital order, performed in [facility] hospital lab) *cepheid single result test* Anterior Nasal Swab     Status: Abnormal   Collection Time: 11/24/21  4:00 PM   Specimen: Anterior Nasal Swab  Result Value Ref Range Status   SARS Coronavirus 2 by RT PCR POSITIVE (A) NEGATIVE Final    Comment: (NOTE) SARS-CoV-2 target nucleic acids are DETECTED  SARS-CoV-2 RNA is generally detectable in upper respiratory specimens  during the acute phase of infection.  Positive results are indicative  of the presence of the identified virus, but do not rule out bacterial infection or co-infection with other pathogens not detected by the test.  Clinical correlation with patient history and  other diagnostic information is necessary to determine patient infection status.  The expected result is negative.  Fact Sheet for Patients:   https://www.patel.info/   Fact Sheet for Healthcare Providers:   https://hall.com/    This test is not yet approved or cleared by the [place] FDA and  has been authorized for detection and/or diagnosis of SARS-CoV-2 by FDA under an Emergency Use Authorization (EUA).  This EUA will remain in effect (meaning this test can be used) for the duration of  the COVID-19 declaration under Section 564(b)(1)  of the Act, 21 U.S.C. section 360-bbb-3(b)(1), unless the authorization is terminated or revoked sooner.   Performed at [facility], [place] [address], [place], [place] [address]   Respiratory (~20 pathogens) panel by PCR     Status:  None   Collection Time: 11/24/21  4:00 PM   Specimen: Nasopharyngeal Swab; Respiratory  Result Value  Ref Range Status   Adenovirus NOT DETECTED NOT DETECTED Final   Coronavirus 229E NOT DETECTED NOT DETECTED Final    Comment: (NOTE) The Coronavirus on the Respiratory Panel, DOES NOT test for the novel  Coronavirus (2019 nCoV)    Coronavirus HKU1 NOT DETECTED NOT DETECTED Final   Coronavirus NL63 NOT DETECTED NOT DETECTED Final   Coronavirus OC43 NOT DETECTED NOT DETECTED Final   Metapneumovirus NOT DETECTED NOT DETECTED Final   Rhinovirus / Enterovirus NOT DETECTED NOT DETECTED Final   Influenza A NOT DETECTED NOT DETECTED Final   Influenza B NOT DETECTED NOT DETECTED Final   Parainfluenza Virus 1 NOT DETECTED NOT DETECTED Final   Parainfluenza Virus 2 NOT DETECTED NOT DETECTED Final   Parainfluenza Virus 3 NOT DETECTED NOT DETECTED Final   Parainfluenza Virus 4 NOT DETECTED NOT DETECTED Final   Respiratory Syncytial Virus NOT DETECTED NOT DETECTED Final   Bordetella pertussis NOT DETECTED NOT DETECTED Final   Bordetella Parapertussis NOT DETECTED NOT DETECTED Final   Chlamydophila pneumoniae NOT DETECTED NOT DETECTED Final   Mycoplasma pneumoniae NOT DETECTED NOT DETECTED Final    Comment: Performed at [facility], [place] [address], [place], [place] [address]     Labs:  CBC: Recent Labs  Lab 11/21/21 0439 11/24/21 1537 11/25/21 0827 11/26/21 0411 11/27/21 0427  WBC 9.3 10.3 10.6* 9.7 20.6*  NEUTROABS  --  8.0*  --   --   --   HGB 12.1 13.5 10.9* 10.2* 9.7*  HCT 36.5 40.7 33.0* 31.1* 29.9*  MCV 98.1 97.1 100.0 100.6* 100.7*  PLT 298 309 281 305 380    BMP &GFR Recent Labs  Lab 11/21/21 0439 11/24/21 1537 11/25/21 0827 11/26/21 0411 11/27/21 0427  NA 136 134* 134* 138 139  K 4.0 4.1 4.1 4.6 4.3  CL 103 104 106 108 107  CO2 25 21* '23 22 23  '$ GLUCOSE 144* 160* 108* 178* 189*  BUN '11 20 15 22 19  '$ CREATININE 0.78 0.94 0.67 0.72 0.65  CALCIUM 8.1* 8.2* 7.8*  8.6* 8.5*  MG 2.6*  --  2.2  --   --   PHOS 3.1  --  2.8  --   --     Estimated Creatinine Clearance: 41.8 mL/min (by C-G formula based on SCr of 0.65 mg/dL). Liver & Pancreas: Recent Labs  Lab 11/21/21 0439 11/24/21 1537 11/25/21 0827 11/26/21 0411 11/27/21 0427  AST  --  53* 30 33 31  ALT  --  73* 54* 54* 54*  ALKPHOS  --  62 49 50 53  BILITOT  --  0.8 0.8 0.4 0.3  PROT  --  6.7 5.6* 5.9* 6.0*  ALBUMIN 2.7* 3.0* 2.4* 2.5* 2.7*    No results for input(s): "LIPASE", "AMYLASE" in the last 168 hours. No results for input(s): "AMMONIA" in the last 168 hours.  Diabetic: No results for input(s): "HGBA1C" in the last 72 hours. Recent Labs  Lab 11/25/21 2252 11/26/21 2135  GLUCAP 233* 148*    Cardiac Enzymes: No results for input(s): "CKTOTAL", "CKMB", "CKMBINDEX", "TROPONINI" in the last 168 hours.  No results for input(s): "PROBNP" in the last 8760 hours. Coagulation Profile: No results for input(s): "INR", "PROTIME" in the last 168 hours. Thyroid Function Tests: No results for input(s): "TSH", "T4TOTAL", "FREET4", "T3FREE", "THYROIDAB" in the last 72 hours. Lipid Profile: No results for input(s): "CHOL", "HDL", "LDLCALC", "TRIG", "CHOLHDL", "LDLDIRECT" in the last 72 hours. Anemia Panel: No results for input(s): "VITAMINB12", "FOLATE", "FERRITIN", "  TIBC", "IRON", "RETICCTPCT" in the last 72 hours. Urine analysis:    Component Value Date/Time   COLORURINE STRAW (A) 11/19/2021 1349   APPEARANCEUR CLOUDY (A) 11/19/2021 1349   LABSPEC 1.003 (L) 11/19/2021 1349   PHURINE 8.0 11/19/2021 1349   GLUCOSEU NEGATIVE 11/19/2021 1349   HGBUR MODERATE (A) 11/19/2021 1349   BILIRUBINUR NEGATIVE 11/19/2021 1349   KETONESUR NEGATIVE 11/19/2021 1349   PROTEINUR NEGATIVE 11/19/2021 1349   NITRITE NEGATIVE 11/19/2021 1349   LEUKOCYTESUR SMALL (A) 11/19/2021 1349   Sepsis Labs: Invalid input(s): "PROCALCITONIN", "LACTICIDVEN"   SIGNED:  Marylu Lund, MD  [facility] 11/27/2021, 2:21 PM

## 2021-11-27 NOTE — Progress Notes (Signed)
Report called to [employer].

## 2021-11-28 DIAGNOSIS — I495 Sick sinus syndrome: Secondary | ICD-10-CM | POA: Diagnosis not present

## 2021-11-28 DIAGNOSIS — I63431 Cerebral infarction due to embolism of right posterior cerebral artery: Secondary | ICD-10-CM | POA: Diagnosis not present

## 2021-11-28 DIAGNOSIS — U071 COVID-19: Secondary | ICD-10-CM | POA: Diagnosis not present

## 2021-11-28 DIAGNOSIS — R4182 Altered mental status, unspecified: Secondary | ICD-10-CM | POA: Diagnosis not present

## 2021-11-28 DIAGNOSIS — M5136 Other intervertebral disc degeneration, lumbar region: Secondary | ICD-10-CM | POA: Diagnosis not present

## 2021-11-28 DIAGNOSIS — F411 Generalized anxiety disorder: Secondary | ICD-10-CM | POA: Diagnosis not present

## 2021-11-28 DIAGNOSIS — R059 Cough, unspecified: Secondary | ICD-10-CM | POA: Diagnosis not present

## 2021-11-28 DIAGNOSIS — R509 Fever, unspecified: Secondary | ICD-10-CM | POA: Diagnosis not present

## 2021-11-28 DIAGNOSIS — Z7952 Long term (current) use of systemic steroids: Secondary | ICD-10-CM | POA: Diagnosis not present

## 2021-11-28 DIAGNOSIS — G4733 Obstructive sleep apnea (adult) (pediatric): Secondary | ICD-10-CM | POA: Diagnosis not present

## 2021-11-28 DIAGNOSIS — R1312 Dysphagia, oropharyngeal phase: Secondary | ICD-10-CM | POA: Diagnosis not present

## 2021-11-28 DIAGNOSIS — R41841 Cognitive communication deficit: Secondary | ICD-10-CM | POA: Diagnosis not present

## 2021-11-28 DIAGNOSIS — N3090 Cystitis, unspecified without hematuria: Secondary | ICD-10-CM | POA: Diagnosis not present

## 2021-11-28 DIAGNOSIS — I471 Supraventricular tachycardia, unspecified: Secondary | ICD-10-CM | POA: Diagnosis not present

## 2021-11-28 DIAGNOSIS — R2681 Unsteadiness on feet: Secondary | ICD-10-CM | POA: Diagnosis not present

## 2021-11-28 DIAGNOSIS — R262 Difficulty in walking, not elsewhere classified: Secondary | ICD-10-CM | POA: Diagnosis not present

## 2021-11-28 DIAGNOSIS — I10 Essential (primary) hypertension: Secondary | ICD-10-CM | POA: Diagnosis not present

## 2021-11-28 DIAGNOSIS — Z789 Other specified health status: Secondary | ICD-10-CM | POA: Diagnosis not present

## 2021-11-28 DIAGNOSIS — I69828 Other speech and language deficits following other cerebrovascular disease: Secondary | ICD-10-CM | POA: Diagnosis not present

## 2021-11-28 DIAGNOSIS — D72829 Elevated white blood cell count, unspecified: Secondary | ICD-10-CM | POA: Diagnosis not present

## 2021-11-28 DIAGNOSIS — E785 Hyperlipidemia, unspecified: Secondary | ICD-10-CM | POA: Diagnosis not present

## 2021-11-28 DIAGNOSIS — I480 Paroxysmal atrial fibrillation: Secondary | ICD-10-CM | POA: Diagnosis not present

## 2021-11-28 DIAGNOSIS — I69398 Other sequelae of cerebral infarction: Secondary | ICD-10-CM | POA: Diagnosis not present

## 2021-11-28 DIAGNOSIS — R627 Adult failure to thrive: Secondary | ICD-10-CM | POA: Diagnosis not present

## 2021-11-28 DIAGNOSIS — M6281 Muscle weakness (generalized): Secondary | ICD-10-CM | POA: Diagnosis not present

## 2021-11-28 DIAGNOSIS — J439 Emphysema, unspecified: Secondary | ICD-10-CM | POA: Diagnosis not present

## 2021-11-28 DIAGNOSIS — N390 Urinary tract infection, site not specified: Secondary | ICD-10-CM | POA: Diagnosis not present

## 2021-11-28 DIAGNOSIS — B9689 Other specified bacterial agents as the cause of diseases classified elsewhere: Secondary | ICD-10-CM | POA: Diagnosis not present

## 2021-11-29 LAB — CULTURE, BLOOD (ROUTINE X 2)
Culture: NO GROWTH
Culture: NO GROWTH
Special Requests: ADEQUATE
Special Requests: ADEQUATE

## 2021-11-30 DIAGNOSIS — R509 Fever, unspecified: Secondary | ICD-10-CM | POA: Diagnosis not present

## 2021-11-30 DIAGNOSIS — B9689 Other specified bacterial agents as the cause of diseases classified elsewhere: Secondary | ICD-10-CM | POA: Diagnosis not present

## 2021-11-30 DIAGNOSIS — D72829 Elevated white blood cell count, unspecified: Secondary | ICD-10-CM | POA: Diagnosis not present

## 2021-11-30 DIAGNOSIS — N3090 Cystitis, unspecified without hematuria: Secondary | ICD-10-CM | POA: Diagnosis not present

## 2021-11-30 DIAGNOSIS — I495 Sick sinus syndrome: Secondary | ICD-10-CM | POA: Diagnosis not present

## 2021-11-30 DIAGNOSIS — I480 Paroxysmal atrial fibrillation: Secondary | ICD-10-CM | POA: Diagnosis not present

## 2021-11-30 DIAGNOSIS — N390 Urinary tract infection, site not specified: Secondary | ICD-10-CM | POA: Diagnosis not present

## 2021-11-30 DIAGNOSIS — U071 COVID-19: Secondary | ICD-10-CM | POA: Diagnosis not present

## 2021-11-30 DIAGNOSIS — E785 Hyperlipidemia, unspecified: Secondary | ICD-10-CM | POA: Diagnosis not present

## 2021-11-30 DIAGNOSIS — R4182 Altered mental status, unspecified: Secondary | ICD-10-CM | POA: Diagnosis not present

## 2021-11-30 DIAGNOSIS — F411 Generalized anxiety disorder: Secondary | ICD-10-CM | POA: Diagnosis not present

## 2021-11-30 DIAGNOSIS — G4733 Obstructive sleep apnea (adult) (pediatric): Secondary | ICD-10-CM | POA: Diagnosis not present

## 2021-11-30 DIAGNOSIS — J439 Emphysema, unspecified: Secondary | ICD-10-CM | POA: Diagnosis not present

## 2021-11-30 DIAGNOSIS — I471 Supraventricular tachycardia, unspecified: Secondary | ICD-10-CM | POA: Diagnosis not present

## 2021-11-30 DIAGNOSIS — R627 Adult failure to thrive: Secondary | ICD-10-CM | POA: Diagnosis not present

## 2021-12-01 DIAGNOSIS — R627 Adult failure to thrive: Secondary | ICD-10-CM | POA: Diagnosis not present

## 2021-12-01 DIAGNOSIS — U071 COVID-19: Secondary | ICD-10-CM | POA: Diagnosis not present

## 2021-12-01 DIAGNOSIS — F411 Generalized anxiety disorder: Secondary | ICD-10-CM | POA: Diagnosis not present

## 2021-12-01 DIAGNOSIS — I495 Sick sinus syndrome: Secondary | ICD-10-CM | POA: Diagnosis not present

## 2021-12-02 ENCOUNTER — Other Ambulatory Visit: Payer: Self-pay | Admitting: *Deleted

## 2021-12-02 NOTE — Patient Outreach (Signed)
Mrs. Blackard resides in [facility]. Screening for potential [facility] care coordination needs as benefit of insurance plan and PCP.  Attended collaborative team meeting at [facility] today. Mrs. Gupton is currently in [place] isolation.   Will continue to follow while in SNF.    Marthenia Rolling, MSN, RN,BSN [place] Acute Care Coordinator [phone] (Direct dial)

## 2021-12-03 DIAGNOSIS — I480 Paroxysmal atrial fibrillation: Secondary | ICD-10-CM | POA: Diagnosis not present

## 2021-12-03 DIAGNOSIS — R627 Adult failure to thrive: Secondary | ICD-10-CM | POA: Diagnosis not present

## 2021-12-03 DIAGNOSIS — N390 Urinary tract infection, site not specified: Secondary | ICD-10-CM | POA: Diagnosis not present

## 2021-12-03 DIAGNOSIS — U071 COVID-19: Secondary | ICD-10-CM | POA: Diagnosis not present

## 2021-12-03 DIAGNOSIS — G4733 Obstructive sleep apnea (adult) (pediatric): Secondary | ICD-10-CM | POA: Diagnosis not present

## 2021-12-03 DIAGNOSIS — F411 Generalized anxiety disorder: Secondary | ICD-10-CM | POA: Diagnosis not present

## 2021-12-03 DIAGNOSIS — B9689 Other specified bacterial agents as the cause of diseases classified elsewhere: Secondary | ICD-10-CM | POA: Diagnosis not present

## 2021-12-03 DIAGNOSIS — I495 Sick sinus syndrome: Secondary | ICD-10-CM | POA: Diagnosis not present

## 2021-12-03 DIAGNOSIS — J439 Emphysema, unspecified: Secondary | ICD-10-CM | POA: Diagnosis not present

## 2021-12-03 DIAGNOSIS — E785 Hyperlipidemia, unspecified: Secondary | ICD-10-CM | POA: Diagnosis not present

## 2021-12-03 DIAGNOSIS — I471 Supraventricular tachycardia, unspecified: Secondary | ICD-10-CM | POA: Diagnosis not present

## 2021-12-04 ENCOUNTER — Inpatient Hospital Stay: Payer: Medicare Other | Admitting: Internal Medicine

## 2021-12-04 DIAGNOSIS — I480 Paroxysmal atrial fibrillation: Secondary | ICD-10-CM | POA: Diagnosis not present

## 2021-12-04 DIAGNOSIS — U071 COVID-19: Secondary | ICD-10-CM | POA: Diagnosis not present

## 2021-12-04 DIAGNOSIS — I10 Essential (primary) hypertension: Secondary | ICD-10-CM | POA: Diagnosis not present

## 2021-12-07 DIAGNOSIS — E785 Hyperlipidemia, unspecified: Secondary | ICD-10-CM | POA: Diagnosis not present

## 2021-12-07 DIAGNOSIS — I471 Supraventricular tachycardia, unspecified: Secondary | ICD-10-CM | POA: Diagnosis not present

## 2021-12-07 DIAGNOSIS — R627 Adult failure to thrive: Secondary | ICD-10-CM | POA: Diagnosis not present

## 2021-12-07 DIAGNOSIS — F411 Generalized anxiety disorder: Secondary | ICD-10-CM | POA: Diagnosis not present

## 2021-12-07 DIAGNOSIS — B9689 Other specified bacterial agents as the cause of diseases classified elsewhere: Secondary | ICD-10-CM | POA: Diagnosis not present

## 2021-12-07 DIAGNOSIS — N390 Urinary tract infection, site not specified: Secondary | ICD-10-CM | POA: Diagnosis not present

## 2021-12-07 DIAGNOSIS — I480 Paroxysmal atrial fibrillation: Secondary | ICD-10-CM | POA: Diagnosis not present

## 2021-12-07 DIAGNOSIS — G4733 Obstructive sleep apnea (adult) (pediatric): Secondary | ICD-10-CM | POA: Diagnosis not present

## 2021-12-07 DIAGNOSIS — J439 Emphysema, unspecified: Secondary | ICD-10-CM | POA: Diagnosis not present

## 2021-12-07 DIAGNOSIS — I495 Sick sinus syndrome: Secondary | ICD-10-CM | POA: Diagnosis not present

## 2021-12-07 DIAGNOSIS — U071 COVID-19: Secondary | ICD-10-CM | POA: Diagnosis not present

## 2021-12-09 ENCOUNTER — Telehealth: Payer: Self-pay

## 2021-12-09 NOTE — Telephone Encounter (Signed)
Patient's daughter called stating patient is now at [facility] and her WBC continue to go up. Patient's daughter is concerned and wanted to try to get patient in sooner. I advised the patient that typically SNF prefer to know 3 days in advance to transport a patient to appointments . I offered her an appointment with Dr. Megan Salon tomorrow and she was able to speak with the facility and they will not be able to bring her tomorrow.  Patient's daughter will keep scheduled appointment with Dr. Gale Journey next week. I advised her per Dr. Gale Journey if patient is eating well and feeling ok that no need to see her sooner.  [name] [name]

## 2021-12-10 ENCOUNTER — Encounter: Payer: Self-pay | Admitting: Adult Health

## 2021-12-10 DIAGNOSIS — J439 Emphysema, unspecified: Secondary | ICD-10-CM | POA: Diagnosis not present

## 2021-12-10 DIAGNOSIS — F411 Generalized anxiety disorder: Secondary | ICD-10-CM | POA: Diagnosis not present

## 2021-12-10 DIAGNOSIS — I495 Sick sinus syndrome: Secondary | ICD-10-CM | POA: Diagnosis not present

## 2021-12-10 DIAGNOSIS — E785 Hyperlipidemia, unspecified: Secondary | ICD-10-CM | POA: Diagnosis not present

## 2021-12-10 DIAGNOSIS — G4733 Obstructive sleep apnea (adult) (pediatric): Secondary | ICD-10-CM | POA: Diagnosis not present

## 2021-12-10 DIAGNOSIS — I471 Supraventricular tachycardia, unspecified: Secondary | ICD-10-CM | POA: Diagnosis not present

## 2021-12-10 DIAGNOSIS — R627 Adult failure to thrive: Secondary | ICD-10-CM | POA: Diagnosis not present

## 2021-12-10 DIAGNOSIS — B9689 Other specified bacterial agents as the cause of diseases classified elsewhere: Secondary | ICD-10-CM | POA: Diagnosis not present

## 2021-12-10 DIAGNOSIS — U071 COVID-19: Secondary | ICD-10-CM | POA: Diagnosis not present

## 2021-12-10 DIAGNOSIS — I480 Paroxysmal atrial fibrillation: Secondary | ICD-10-CM | POA: Diagnosis not present

## 2021-12-10 DIAGNOSIS — N390 Urinary tract infection, site not specified: Secondary | ICD-10-CM | POA: Diagnosis not present

## 2021-12-12 IMAGING — CT CT TEMPORAL BONES W/O CM
1 series · 15 of 30 positions shown, 19 images · non-contrast
Comparison: Head CT 12/20/2019.  Head and neck CTA 11/13/2019.

CLINICAL DATA: Left-sided hearing loss.

EXAM:
CT TEMPORAL BONES WITHOUT CONTRAST
TECHNIQUE: Axial and coronal plane CT imaging of the petrous temporal bones was
performed with thin-collimation image reconstruction. No intravenous
contrast was administered. Multiplanar CT image reconstructions were
also generated.

[Series 5: soft tissue · axial · 0.39mm/px · z∈[-172,-92]mm · 15 of 44 slices shown, 19 images]
[im 2/44  brain]
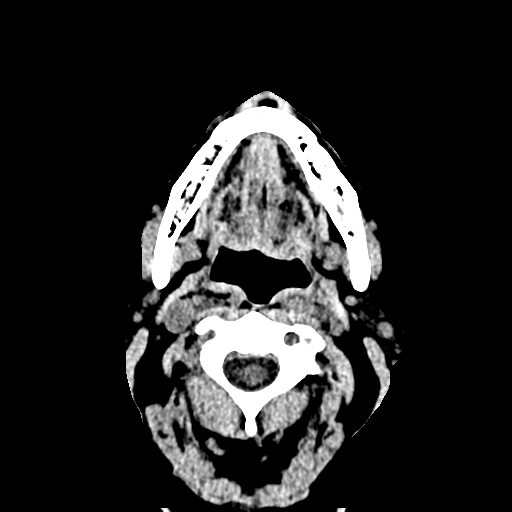
[im 2/44  bone]
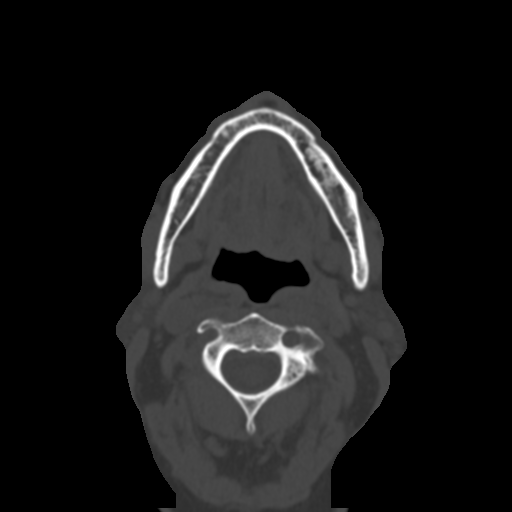
[im 5/44  bone]
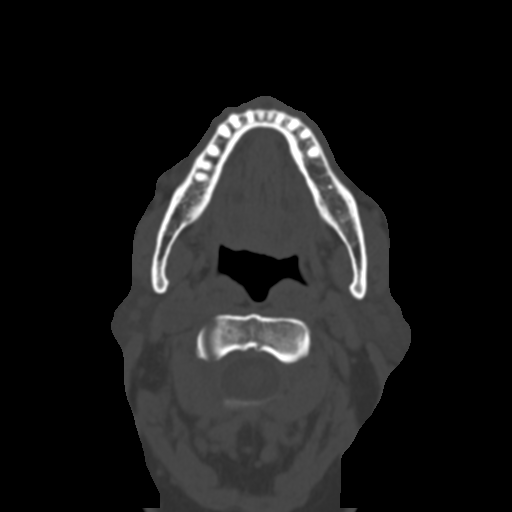
[im 8/44  bone]
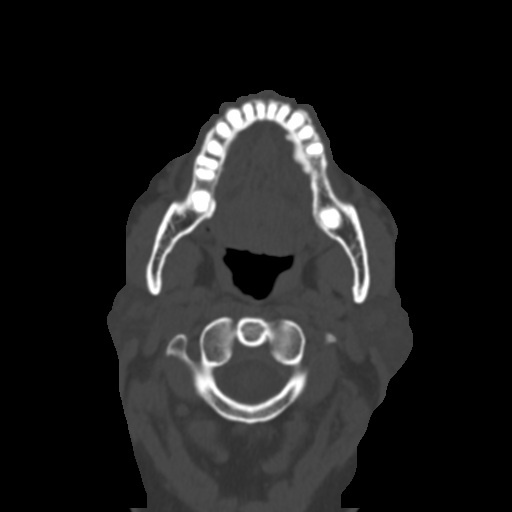
[im 11/44  bone]
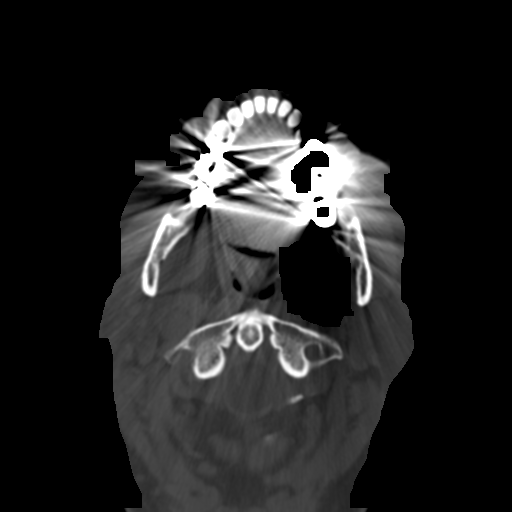
[im 14/44  brain]
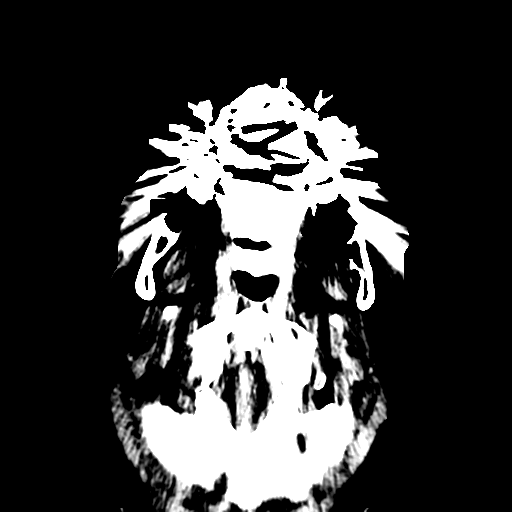
[im 14/44  bone]
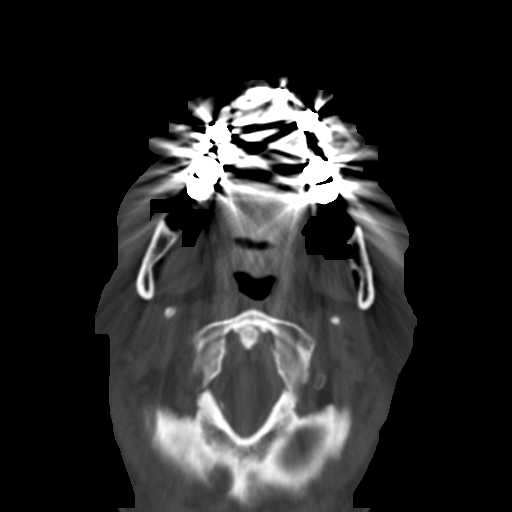
[im 17/44  bone]
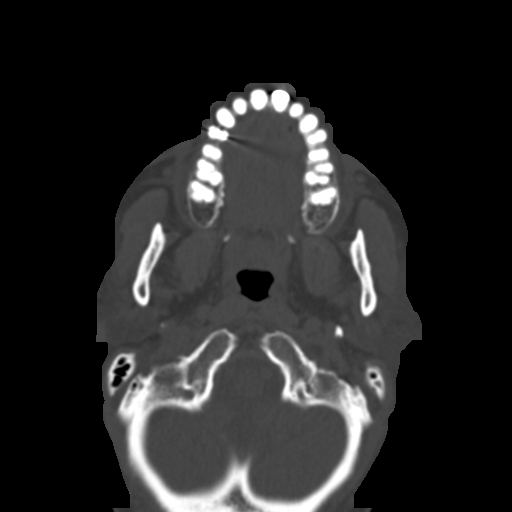
[im 20/44  bone]
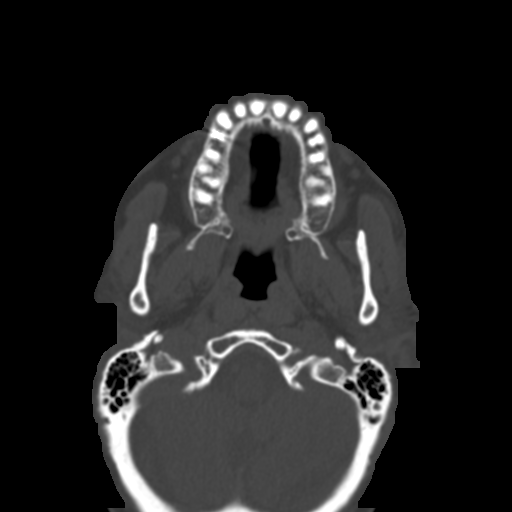
[im 23/44  bone]
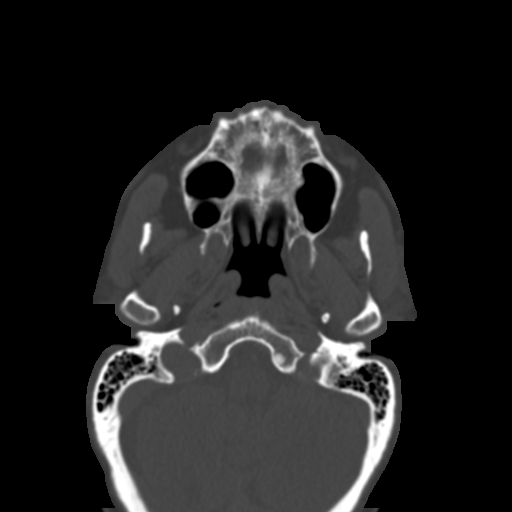
[im 24/44  brain]
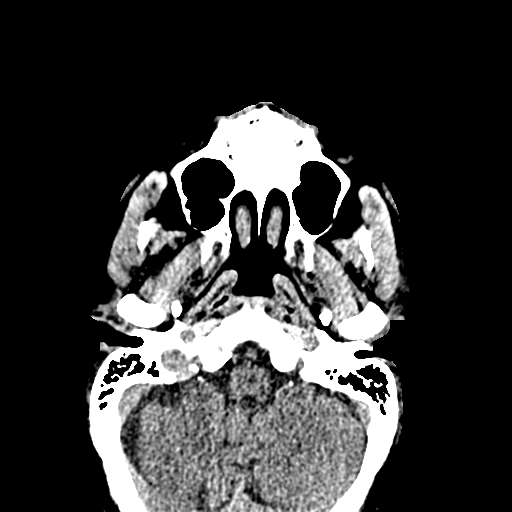
[im 24/44  bone]
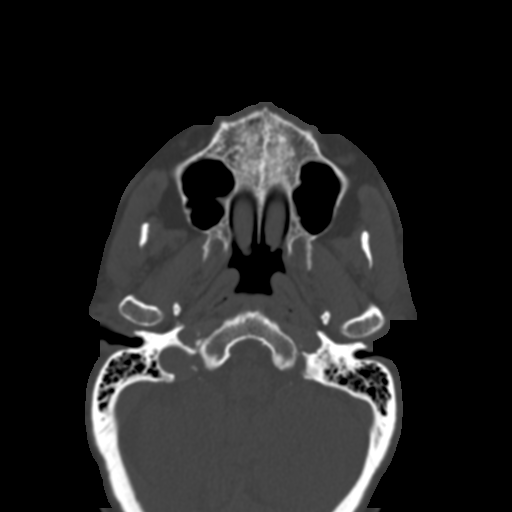
[im 27/44  bone]
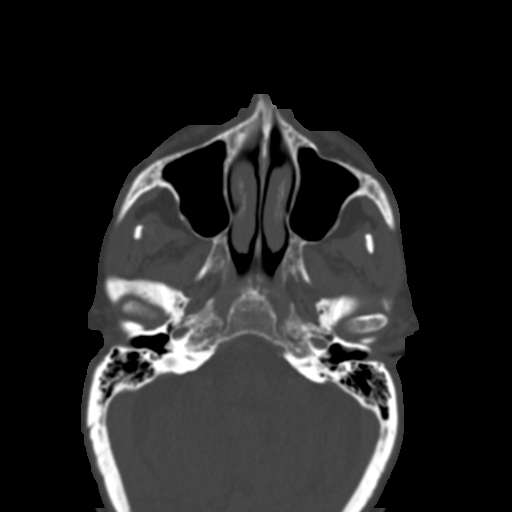
[im 30/44  bone]
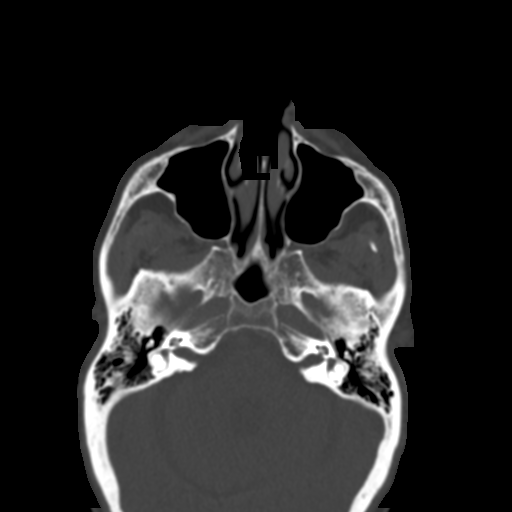
[im 33/44  bone]
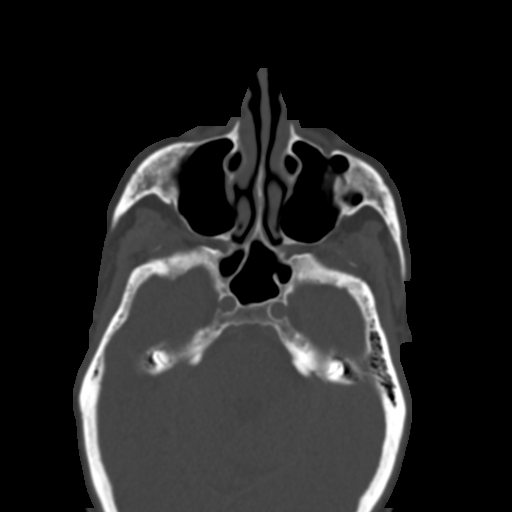
[im 36/44  brain]
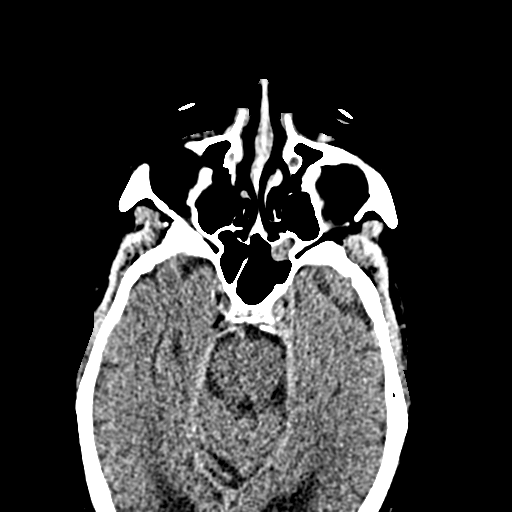
[im 36/44  bone]
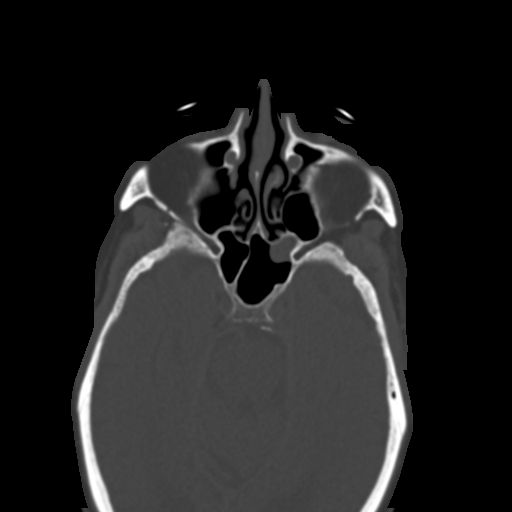
[im 39/44  bone]
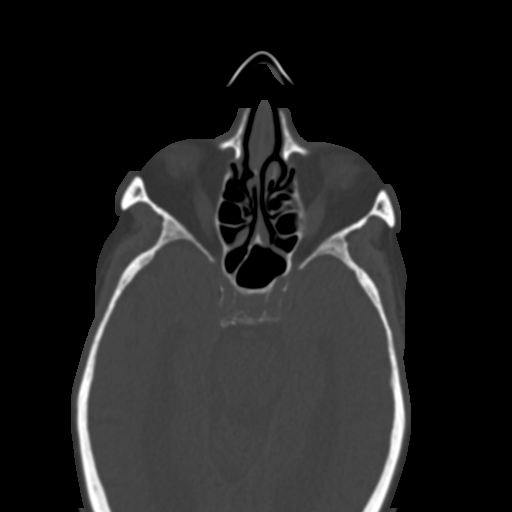
[im 42/44  bone]
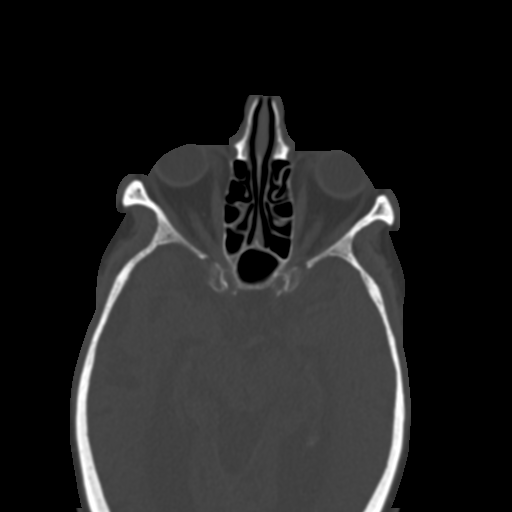

[15 of 30 positions shown; findings below may reference images not displayed]

FINDINGS: RIGHT: The external auditory canal is unremarkable. The ossicles
appear intact. The tympanic cavity is clear. The internal auditory
canal, cochlea, vestibule, and semicircular canals are unremarkable.
The mastoid air cells are clear.

LEFT: There is mild non-masslike soft tissue thickening along the
walls of the cartilaginous external auditory canal with narrowing of
the EAC to 2 mm minimal AP diameter, similar to the prior CTA. A
small amount of soft tissue along the anterior wall of the bony EAC
may reflect cerumen. There is no underlying osseous erosion. The
ossicles appear intact. The tympanic cavity is clear. The internal
auditory canal, cochlea, vestibule, and semicircular canals are
unremarkable. The mastoid air cells are clear.

There is mild bilateral ethmoid air cell mucosal thickening, and
there is a small mucous retention cyst in the left sphenoid sinus.
The visualized portion of the brain demonstrates atrophy. Bilateral
cataract extraction.
IMPRESSION: 1. Narrowing of the left external auditory canal by non-masslike
soft tissue, also present in [DATE] without gross interval
progression or osseous erosion.
2. Otherwise unremarkable appearance of the temporal bones.

## 2021-12-14 ENCOUNTER — Other Ambulatory Visit: Payer: Self-pay | Admitting: *Deleted

## 2021-12-14 DIAGNOSIS — G4733 Obstructive sleep apnea (adult) (pediatric): Secondary | ICD-10-CM | POA: Diagnosis not present

## 2021-12-14 DIAGNOSIS — F411 Generalized anxiety disorder: Secondary | ICD-10-CM | POA: Diagnosis not present

## 2021-12-14 DIAGNOSIS — J439 Emphysema, unspecified: Secondary | ICD-10-CM | POA: Diagnosis not present

## 2021-12-14 DIAGNOSIS — N390 Urinary tract infection, site not specified: Secondary | ICD-10-CM | POA: Diagnosis not present

## 2021-12-14 DIAGNOSIS — I495 Sick sinus syndrome: Secondary | ICD-10-CM | POA: Diagnosis not present

## 2021-12-14 DIAGNOSIS — I480 Paroxysmal atrial fibrillation: Secondary | ICD-10-CM | POA: Diagnosis not present

## 2021-12-14 DIAGNOSIS — E785 Hyperlipidemia, unspecified: Secondary | ICD-10-CM | POA: Diagnosis not present

## 2021-12-14 DIAGNOSIS — B9689 Other specified bacterial agents as the cause of diseases classified elsewhere: Secondary | ICD-10-CM | POA: Diagnosis not present

## 2021-12-14 DIAGNOSIS — I471 Supraventricular tachycardia, unspecified: Secondary | ICD-10-CM | POA: Diagnosis not present

## 2021-12-14 DIAGNOSIS — U071 COVID-19: Secondary | ICD-10-CM | POA: Diagnosis not present

## 2021-12-14 DIAGNOSIS — R627 Adult failure to thrive: Secondary | ICD-10-CM | POA: Diagnosis not present

## 2021-12-14 NOTE — Patient Outreach (Signed)
[place] Coordinator follow up. Per [facility] [name] resides in [place] [facility]. Screening for potential [facility] care coordination services as benefit of insurance plan and PCP.   Secure communication sent to SNF social worker to inquire about transition plans.   Will continue to follow.  Marthenia Rolling, MSN, RN,BSN [place] Acute Care Coordinator [phone] (Direct dial)

## 2021-12-15 ENCOUNTER — Ambulatory Visit (INDEPENDENT_AMBULATORY_CARE_PROVIDER_SITE_OTHER): Payer: Medicare Other | Admitting: Neurology

## 2021-12-15 ENCOUNTER — Other Ambulatory Visit: Payer: Self-pay | Admitting: *Deleted

## 2021-12-15 VITALS — BP 123/81 | HR 91 | Ht 60.0 in | Wt 150.6 lb

## 2021-12-15 DIAGNOSIS — Z789 Other specified health status: Secondary | ICD-10-CM | POA: Diagnosis not present

## 2021-12-15 DIAGNOSIS — G4733 Obstructive sleep apnea (adult) (pediatric): Secondary | ICD-10-CM | POA: Diagnosis not present

## 2021-12-15 DIAGNOSIS — I63431 Cerebral infarction due to embolism of right posterior cerebral artery: Secondary | ICD-10-CM

## 2021-12-15 NOTE — Progress Notes (Signed)
Subjective:    Patient ID: [name] is a 88 y.o. female.  HPI    Interim history:   Ms. Buxbaum is an 88 year old right-handed woman with an underlying medical history of paroxysmal atrial fibrillation, SVT, right PCA stroke with status post tPA and mechanical thrombectomy, sciatica, and overweight state, who presents for follow-up consultation of obstructive sleep apnea, on AutoPap therapy.  The patient is accompanied by her daughter today.  She is currently at [place], rehab.  I first met her at the request of Dr. Leonie Man and Frann Rider, NP, on 07/07/2020, at which time she was reported to have significant daytime somnolence since her stroke and snoring.  She was advised to proceed with a sleep study.  We attempted a baseline sleep study in the sleep lab but she did not have enough sleep time.  She was therefore advised to proceed with a home sleep test.  Her HST from 08/20/2020 showed an AHI of 37.5/h, O2 nadir 82% with moderate to loud snoring detected.  She was advised to proceed with AutoPap therapy.  She has since then seen Frann Rider, NP but has struggled with AutoPap tolerance.  She was seen on 05/26/2021, at which time she was compliant with her AutoPap, residual AHI was 5.7/h, 95th percentile of pressure at 7.6 cm.  She still had residual daytime somnolence.  Today, 12/15/2021: I reviewed her AutoPap compliance data from 10/22/2021 through 11/21/2021.  She used her machine 14 out of 31 days, percent use days greater than 4 hours at 19%, residual AHI 9.7/h, primarily due to obstructive events, pressure range of 6-12, 95th percentile of pressure at 7.8, leak on the higher side.  She reports using it very reluctantly, she does not see any benefit, she wishes to discontinue.  Her daughter has noted recurrence of snoring since she stopped using the machine.  Her daughter would like to discuss discontinuation with the rest of the family before making a decision.  She has previously been  quite compliant with treatment but never liked it and never felt it benefited from her.  We talked about moderate obstructive sleep apnea, its risks and ramifications, alternative treatment options were also discussed.  The patient has a permanent bridge on top, she and daughter both believe that she would not be able to sleep with something in her mouth that pulls her jaw forward like an oral appliance.  We talked about inspire as well, and explained that this is surgical and to be mutually agreed not to have her undergo any surgical elective procedure, the patient certainly does not wish to have any type of surgery.  She is on Eliquis as well.  She had interim emergency room visits, particularly in October 2023 with different problems including sciatica, fever, and weakness/dehydration.  Previously:   07/07/20: (She) reports excessive daytime somnolence since her stroke.  She has been sleeping long hours at night and also takes longer naps.  She has a tendency to nap in the afternoons but typically would nap for 30 or 45 minutes but since her stroke she can sleep for up to 2 hours per daughter.  She has a history of snoring, it is considered soft.  No witnessed apneas or gasping sensations at night.  She has nocturia about once per average night and denies recurrent morning headaches or family history of sleep apnea.  She lives alone but her children have taken turns to stay with her since she lost her husband recently and also since  her recent stroke.  Since her stroke she had appetite loss and weight loss and was started on Remeron per PCP.  She is currently on 15 mg at bedtime.  I reviewed your office note from 05/13/2020.  Her Epworth sleepiness score is 7 out of 24, fatigue severity score is 1 out of 63.  Bedtime is generally between 10 and 11 PM and rise time between 1030 and 11 AM.  She drinks caffeine in the form of flavored water with an energy supplement, 1 serving per day, she drinks alcohol daily  in the form of wine, 4 ounce glass, 2/day on average.  She has not been advised to cut back.  She is a non-smoker, quit smoking about 50 years ago.       The patient's allergies, current medications, family history, past medical history, past social history, past surgical history and problem list were reviewed and updated as appropriate.    Her Past Medical History Is Significant For: Past Medical History:  Diagnosis Date   Atrial fibrillation ([place])    Prior treatment with amiodarone, held sinus rhythm and drug stopped   Atrial flutter ([place])    Status post ablation prior to  2009   Chest pain    Nuclear, 2004, no scar or ischemia, EF 77%   Ejection fraction    EF 70%, nuclear,  2004  //      Fall    July, 2014   FUO (fever of unknown origin) 11/15/2021   H/O bladder repair surgery    OSA (obstructive sleep apnea)    Right Achilles tendinitis    Sciatica     Her Past Surgical History Is Significant For: Past Surgical History:  Procedure Laterality Date   IR CT HEAD LTD  11/14/2019   IR PERCUTANEOUS ART THROMBECTOMY/INFUSION INTRACRANIAL INC DIAG ANGIO  11/14/2019   IR RADIOLOGIST EVAL & MGMT  02/20/2020   IR US GUIDE VASC ACCESS RIGHT  11/14/2019   no surgical hx     RADIOLOGY WITH ANESTHESIA N/A 11/13/2019   Procedure: IR WITH ANESTHESIA;  Surgeon: Luanne Bras, MD;  Location: [facility];  Service: Radiology;  Laterality: N/A;    Her Family History Is Significant For: Family History  Problem Relation Age of Onset   Cancer Mother    Cancer Father        pancreatic cancer   Heart disease Brother     Her Social History Is Significant For: Social History   Socioeconomic History   Marital status: Widowed    Spouse name: Not on file   Number of children: 4   Years of education: Not on file   Highest education level: Not on file  Occupational History   Not on file  Tobacco Use   Smoking status: Former    Packs/day: 1.00    Years: 10.00    Total pack years:  10.00    Types: Cigarettes   Smokeless tobacco: Never  Vaping Use   Vaping Use: Never used  Substance and Sexual Activity   Alcohol use: Yes   Drug use: Never   Sexual activity: Not on file  Other Topics Concern   Not on file  Social History Narrative   11/24/20 husband passed, children stay with her at night   Social Determinants of Health   Financial Resource Strain: Not on file  Food Insecurity: No Food Insecurity (11/19/2021)   Hunger Vital Sign    Worried About Running Out of Food in the Last Year: Never  true    Ran Out of Food in the Last Year: Never true  Transportation Needs: No Transportation Needs (11/19/2021)   PRAPARE - Hydrologist (Medical): No    Lack of Transportation (Non-Medical): No  Physical Activity: Not on file  Stress: Not on file  Social Connections: Not on file    Her Allergies Are:  Allergies  Allergen Reactions   Bee Venom Anaphylaxis   Mixed Vespid Venom Anaphylaxis   Codeine Other (See Comments)    Exact allergic reaction not known   Sulfa Antibiotics Itching and Nausea Only   Erythromycin Nausea And Vomiting  :   Her Current Medications Are:  Outpatient Encounter Medications as of 12/15/2021  Medication Sig   acetaminophen (TYLENOL) 325 MG tablet Take 2 tablets (650 mg total) by mouth every 6 (six) hours as needed for mild pain, fever or headache.   amLODipine (NORVASC) 2.5 MG tablet Take 1 tablet (2.5 mg total) by mouth daily.   apixaban (ELIQUIS) 5 MG TABS tablet Take 1 tablet (5 mg total) by mouth 2 (two) times daily.   ascorbic acid (VITAMIN C) 500 MG tablet Take 1 tablet (500 mg total) by mouth daily.   Cholecalciferol (VITAMIN D3) 50 MCG (2000 UT) CAPS Take 2,000 Units by mouth at bedtime.   GEMTESA 75 MG TABS Take 75 mg by mouth daily.   Ipratropium-Albuterol (COMBIVENT) 20-100 MCG/ACT AERS respimat Inhale 1 puff into the lungs every 6 (six) hours as needed for wheezing or shortness of breath.    mirtazapine (REMERON) 7.5 MG tablet Take 1 tablet (7.5 mg total) by mouth at bedtime.   Multiple Vitamins-Minerals (SENIOR VITES PO) Take 1 tablet by mouth daily with breakfast.   ondansetron (ZOFRAN) 4 MG tablet Take 1 tablet (4 mg total) by mouth every 6 (six) hours as needed for nausea.   polyethylene glycol (MIRALAX / GLYCOLAX) 17 g packet Take 17 g by mouth 2 (two) times daily as needed for mild constipation.   Probiotic Product ([place]) CAPS Take 1 capsule by mouth daily.   senna-docusate (SENOKOT-S) 8.6-50 MG tablet Take 1 tablet by mouth 2 (two) times daily as needed for moderate constipation.   zinc sulfate 220 (50 Zn) MG capsule Take 1 capsule (220 mg total) by mouth daily.   No facility-administered encounter medications on file as of 12/15/2021.  :  Review of Systems:  Out of a complete 14 point review of systems, all are reviewed and negative with the exception of these symptoms as listed below:   Review of Systems  Neurological:        Has used the cpap but is hating it.  She does not want to use it.  RESVENT.  ESS 3, FSS 62.      Objective:  Neurological Exam  Physical Exam Physical Examination:   Vitals:   12/15/21 0836  BP: 123/81  Pulse: 91    General Examination: The patient is a very pleasant 88 y.o. female in no acute distress. She appears well-developed and well-nourished and well groomed.  Situated in a wheelchair.  HEENT: Normocephalic, atraumatic, pupils are equal, round and reactive to light, corrective eyeglasses in place, tracking well-preserved, hearing grossly intact.  Face symmetric.  Speech without dysarthria.  Airway examination with stable findings, mild mouth dryness noted, minimal overbite.     Chest: Clear to auscultation without wheezing, rhonchi or crackles noted.   Heart: S1+S2+0, regular and normal without murmurs, rubs or gallops noted.  Abdomen: Soft, non-tender and non-distended.   Extremities: There is no obvious  edema in the distal lower extremities bilaterally.    Skin: Warm and dry without trophic changes noted.    Musculoskeletal: exam reveals no obvious joint deformities.     Neurologically:  Mental status: The patient is awake, alert and oriented in all 4 spheres. Her immediate and remote memory, attention, language skills and fund of knowledge are appropriate. Mood is normal and affect is normal.  Cranial nerves II - XII are as described above under HEENT exam.  Motor exam: Thin bulk, global strength equal, no obvious tremor. Sensory exam: intact to light touch in the upper and lower extremities.  Gait, station and balance: in WC.     Assessment and Plan:  In summary, LINDSY CERULLO is an 88 year old female with an underlying medical history of paroxysmal atrial fibrillation, SVT, right PCA stroke with status post tPA and mechanical thrombectomy, sciatica, and overweight state, who presents for follow-up consultation of her moderate obstructive sleep apnea.  She has been on AutoPap therapy with overall good compliance, does not report any subjective benefit, has had improvement in apnea numbers, lately has not used her machine in the past nearly 1 month.  She is in favor of discontinuation, I discussed the diagnosis of moderate and severe sleep apnea, its risks and ramifications, treatment options, and we mutually agreed that she would not want an oral appliance.  She is also advised regarding inspire and would not want any surgical intervention which will be difficult given age and risk factors of blood thinner.  The patient is willing to discuss this matter with her family, her daughter would like to get input from patient's other children and then let us know if she would like to go ahead with a D/C order.  We will plan a follow-up in sleep clinic as needed.  She is encouraged to follow-up with her primary neurologist and/or nurse practitioner as scheduled.  I answered all the questions today and the  patient and her daughter were in agreement.  I spent 30 minutes in total face-to-face time and in reviewing records during pre-charting, more than 50% of which was spent in counseling and coordination of care, reviewing test results, reviewing medications and treatment regimen and/or in discussing or reviewing the diagnosis of OSA, the prognosis and treatment options. Pertinent laboratory and imaging test results that were available during this visit with the patient were reviewed by me and considered in my medical decision making (see chart for details).

## 2021-12-15 NOTE — Patient Outreach (Signed)
[place] Coordinator follow up. Mrs. [name] resides in [facility]. Screening for potential [facility] care coordination needs as a benefit of insurance plan and PCP.   Update received from [place] [place]. [name] is slated to return home on 12/17/21. Will have [facility] for PT/OT/ Nursing. Wheelchair has been ordered.  Will plan outreach to discuss [facility] follow up.    Melissa Rolling, MSN, RN,BSN [place] Acute Care Coordinator [phone] (Direct dial)

## 2021-12-16 ENCOUNTER — Ambulatory Visit (INDEPENDENT_AMBULATORY_CARE_PROVIDER_SITE_OTHER): Payer: Medicare Other | Admitting: Internal Medicine

## 2021-12-16 ENCOUNTER — Encounter: Payer: Self-pay | Admitting: Internal Medicine

## 2021-12-16 ENCOUNTER — Other Ambulatory Visit: Payer: Self-pay

## 2021-12-16 VITALS — BP 122/63 | HR 87 | Temp 98.1°F

## 2021-12-16 DIAGNOSIS — I480 Paroxysmal atrial fibrillation: Secondary | ICD-10-CM | POA: Diagnosis not present

## 2021-12-16 DIAGNOSIS — U071 COVID-19: Secondary | ICD-10-CM

## 2021-12-16 DIAGNOSIS — I10 Essential (primary) hypertension: Secondary | ICD-10-CM | POA: Diagnosis not present

## 2021-12-16 DIAGNOSIS — D72829 Elevated white blood cell count, unspecified: Secondary | ICD-10-CM | POA: Diagnosis not present

## 2021-12-16 DIAGNOSIS — Z7952 Long term (current) use of systemic steroids: Secondary | ICD-10-CM

## 2021-12-16 NOTE — Patient Instructions (Signed)
I think you have long covid syndrome and I have placed referral to [facility] for ongoing management of this   Your elevated white blood cell is due to prednisone, which I advise you'll need to titrate down as quickly as possible. There is no indication for this now for your covid  Steroid makes you feel more energetic/eats more which you report now but depress your immune system, and can make your muscle weak/diseased, and also cause you to be extremely fatigue off of it

## 2021-12-16 NOTE — Progress Notes (Signed)
[facility] for Infectious Disease  Patient Active Problem List   Diagnosis Date Noted   Failure to thrive in adult 11/20/2021   Cognitive decline 11/20/2021   UTI (urinary tract infection) 11/19/2021   Hyperglycemia 11/19/2021   Lactic acidosis 11/19/2021   History of COVID-19 11/19/2021   Dehydration 11/19/2021   Asymptomatic bacteriuria 11/16/2021   FUO (fever of unknown origin) 11/15/2021   Hypokalemia 11/12/2021   Generalized weakness 11/11/2021   Hypoalbuminemia 11/03/2021   Abnormal LFTs 11/03/2021   COVID-19 virus infection 10/30/2021   Tachycardia-bradycardia syndrome ([place]) 05/19/2021   Obstructive sleep apnea 05/19/2021   Pseudoaneurysm of femoral artery following procedure ([place]) 11/23/2019   CAP (community acquired pneumonia) 11/23/2019   Essential hypertension 11/23/2019   Hyperlipidemia LDL goal <70 11/23/2019   Emphysema of lung ([place]) 11/23/2019   Aortic atherosclerosis ([place]) 11/23/2019   Acute ischemic stroke (HCC)    Pseudoaneurysm (HCC)    Slow transit constipation    Acute ischemic right PCA stroke (HCC) s/p tPA and mechanical thrombectomy, d/t AF not on [facility] 11/14/2019   History of stroke 11/13/2019   Degeneration of lumbar intervertebral disc 02/01/2019   Impacted cerumen of both ears 07/25/2015   Sensorineural hearing loss (SNHL), bilateral 07/25/2015   Hymenoptera allergy 10/05/2014   SVT (supraventricular tachycardia) 10/20/2012   PAF (paroxysmal atrial fibrillation) (HCC)    Atrial flutter (HCC)       Subjective:    Patient ID: [name], female    DOB: 10-18-1931, 88 y.o.   MRN: [id number]  Chief Complaint  Patient presents with   Follow-up    Fatigue     HPI:  [name] is a 88 y.o. female here for hospital f/u  She is in snf now  She has been dx'ed with relapsed covid x3, most recently 10/26 in hospital There is question of chronic dysuria and was given briefly 3 days of abx each admission Covid antiviral  given each admission She was also started on prednisone and her snf had continued her for the past 3 weeks; currently she is on 30 mg  She feels well on pred and when off gets very fatigued  No current f/c  She was started on cephalexin at snf for uti prophy as well  She has no cough, rash, joint pain  She sleeps well and eats well  She asks about wbc count of 12-14 the last couple weeks done at snf  She is here with her daughter   Allergies  Allergen Reactions   Bee Venom Anaphylaxis   Mixed Vespid Venom Anaphylaxis   Codeine Other (See Comments)    Exact allergic reaction not known   Sulfa Antibiotics Itching and Nausea Only   Erythromycin Nausea And Vomiting      Outpatient Medications Prior to Visit  Medication Sig Dispense Refill   acetaminophen (TYLENOL) 325 MG tablet Take 2 tablets (650 mg total) by mouth every 6 (six) hours as needed for mild pain, fever or headache.     amLODipine (NORVASC) 2.5 MG tablet Take 1 tablet (2.5 mg total) by mouth daily. 90 tablet 3   apixaban (ELIQUIS) 5 MG TABS tablet Take 1 tablet (5 mg total) by mouth 2 (two) times daily. 180 tablet 1   ascorbic acid (VITAMIN C) 500 MG tablet Take 1 tablet (500 mg total) by mouth daily. 30 tablet 0   Cholecalciferol (VITAMIN D3) 50 MCG (2000 UT) CAPS Take 2,000 Units by mouth at  bedtime.     GEMTESA 75 MG TABS Take 75 mg by mouth daily.     Ipratropium-Albuterol (COMBIVENT) 20-100 MCG/ACT AERS respimat Inhale 1 puff into the lungs every 6 (six) hours as needed for wheezing or shortness of breath. 1 each 0   mirtazapine (REMERON) 7.5 MG tablet Take 1 tablet (7.5 mg total) by mouth at bedtime.     Multiple Vitamins-Minerals (SENIOR VITES PO) Take 1 tablet by mouth daily with breakfast.     ondansetron (ZOFRAN) 4 MG tablet Take 1 tablet (4 mg total) by mouth every 6 (six) hours as needed for nausea. 20 tablet 0   polyethylene glycol (MIRALAX / GLYCOLAX) 17 g packet Take 17 g by mouth 2 (two) times daily  as needed for mild constipation. 14 each 0   Probiotic Product ([facility]) CAPS Take 1 capsule by mouth daily.     senna-docusate (SENOKOT-S) 8.6-50 MG tablet Take 1 tablet by mouth 2 (two) times daily as needed for moderate constipation.     zinc sulfate 220 (50 Zn) MG capsule Take 1 capsule (220 mg total) by mouth daily. 14 capsule 0   No facility-administered medications prior to visit.     Social History   Socioeconomic History   Marital status: Widowed    Spouse name: Not on file   Number of children: 4   Years of education: Not on file   Highest education level: Not on file  Occupational History   Not on file  Tobacco Use   Smoking status: Former    Packs/day: 1.00    Years: 10.00    Total pack years: 10.00    Types: Cigarettes   Smokeless tobacco: Never  Vaping Use   Vaping Use: Never used  Substance and Sexual Activity   Alcohol use: Yes   Drug use: Never   Sexual activity: Not on file  Other Topics Concern   Not on file  Social History Narrative   11/24/20 husband passed, children stay with her at night   Social Determinants of Health   Financial Resource Strain: Not on file  Food Insecurity: No Food Insecurity (11/19/2021)   Hunger Vital Sign    Worried About Running Out of Food in the Last Year: Never true    [place] in the Last Year: Never true  Transportation Needs: No Transportation Needs (11/19/2021)   PRAPARE - Hydrologist (Medical): No    Lack of Transportation (Non-Medical): No  Physical Activity: Not on file  Stress: Not on file  Social Connections: Not on file  Intimate Partner Violence: Not At Risk (11/19/2021)   Humiliation, Afraid, Rape, and Kick questionnaire    Fear of Current or Ex-Partner: No    Emotionally Abused: No    Physically Abused: No    Sexually Abused: No      Review of Systems     Objective:    LMP  (LMP Unknown)  Nursing note and vital signs reviewed.  Physical  Exam     General/constitutional: no distress, pleasant, conversant; in wheel chair HEENT: Normocephalic, PER, Conj Clear, EOMI, Oropharynx clear Neck supple CV: rrr no mrg Lungs: clear to auscultation, normal respiratory effort Abd: Soft, Nontender Ext: no edema Skin: No Rash Neuro: nonfocal MSK: no peripheral joint swelling/tenderness/warmth; back spines nontender    Labs:  Micro:  Serology:  Imaging:  Assessment & Plan:   Problem List Items Addressed This Visit   None  No orders of the defined types were placed in this encounter.    88 yo female with admissions x3 the last 6 weeks for malaise/flu/ams like illness found to have persistent covid infection. During all 3 episodes of admission did have fever. Chronic dysuria confounded admitted team with potential uti dx but further review didn't suggest presence of uti, did receive short 3 days of abx the last 2 admissions.  She did received covid specific tx during all 3 admisions.   Covid testing 10/31 positive (low cycle threshold) 10/17 positive (low cycle threshold) 10/05 positive initial dx symptomatic  Micro: 10/26 ucx multiple species 10/17 ucx 30k e faecalis 10/17 bcx 1 of 2 set CoNs 10/21 bcx negative 10/26 bcx 1 of 2 set bacillus species 10/31 bcx negative  12/2019 last negative covid testing  She was recently admitted 10/26-11/03. Discharged to snf - [facility]  Her snf had started her on cephalexin prophylaxis for uti which I think is fine  She has been on high dose prednisone for the past 3-5 weeks being titrated down 10 mg weekly; currently as of this visit 12/16/21 on 30 mg daily. There is really no indication for this and how she feels now (increased appetite/sense of alertness) is rather deceiving and do not outweigh long term risk  The wbc count is only 12 and down trending since she has been on steroid and there is no concern from my standpoint   She might have long covid and maybe  the wbc is related to this   Follow-up: No follow-ups on file.  I have spent a total of 40 minutes of face-to-face and non-face-to-face time, excluding clinical staff time, preparing to see patient, ordering tests and/or medications, and provide counseling the patient     Jabier Mutton, [place] [facility] 12/16/2021, 2:08 PM

## 2021-12-19 DIAGNOSIS — M5431 Sciatica, right side: Secondary | ICD-10-CM | POA: Diagnosis not present

## 2021-12-19 DIAGNOSIS — D6869 Other thrombophilia: Secondary | ICD-10-CM | POA: Diagnosis not present

## 2021-12-19 DIAGNOSIS — M1990 Unspecified osteoarthritis, unspecified site: Secondary | ICD-10-CM | POA: Diagnosis not present

## 2021-12-19 DIAGNOSIS — M5116 Intervertebral disc disorders with radiculopathy, lumbar region: Secondary | ICD-10-CM | POA: Diagnosis not present

## 2021-12-19 DIAGNOSIS — E871 Hypo-osmolality and hyponatremia: Secondary | ICD-10-CM | POA: Diagnosis not present

## 2021-12-19 DIAGNOSIS — I480 Paroxysmal atrial fibrillation: Secondary | ICD-10-CM | POA: Diagnosis not present

## 2021-12-21 ENCOUNTER — Other Ambulatory Visit: Payer: Self-pay | Admitting: *Deleted

## 2021-12-21 DIAGNOSIS — E871 Hypo-osmolality and hyponatremia: Secondary | ICD-10-CM | POA: Diagnosis not present

## 2021-12-21 DIAGNOSIS — I10 Essential (primary) hypertension: Secondary | ICD-10-CM

## 2021-12-21 DIAGNOSIS — M5116 Intervertebral disc disorders with radiculopathy, lumbar region: Secondary | ICD-10-CM | POA: Diagnosis not present

## 2021-12-21 DIAGNOSIS — M5431 Sciatica, right side: Secondary | ICD-10-CM | POA: Diagnosis not present

## 2021-12-21 DIAGNOSIS — D6869 Other thrombophilia: Secondary | ICD-10-CM | POA: Diagnosis not present

## 2021-12-21 DIAGNOSIS — M1990 Unspecified osteoarthritis, unspecified site: Secondary | ICD-10-CM | POA: Diagnosis not present

## 2021-12-21 DIAGNOSIS — I480 Paroxysmal atrial fibrillation: Secondary | ICD-10-CM | POA: Diagnosis not present

## 2021-12-21 NOTE — Patient Outreach (Signed)
[facility] Post- Acute Care Coordinator follow up. Verified in [facility] in [name] discharged from [facility] on 12/17/21.   SNF social worker previously indicated Mrs. Fuentes will have [facility] for PT/OT/nursing services.   Will make referral to [facility] care coordination team.    Marthenia Rolling, MSN, RN,BSN [place] Acute Care Coordinator [phone] (Direct dial)

## 2021-12-21 NOTE — Progress Notes (Unsigned)
Cardiology Office Note:    Date:  12/22/2021   ID:  [name], DOB 03/28/1931, MRN [id number]  PCP:  Perini, Mark, MD  [facility] Providers Cardiologist:  Mark Skains, MD     Referring MD: Perini, Mark, MD   Chief Complaint:  Hospitalization Follow-up     History of Present Illness:   [name] is a 88 y.o. female with history of tachycardia bradycardia syndrome, Aflutter ablation 2007, history of CVA, OSA on CPAP, HLD.   Patient saw Dr. Skains 04/2021 when HR was 40 on apple watch, 45 radially. She had some fatigue but no dizziness or syncope. Zio monitor  showed slow Afib 100%  Patient discharged 11/27/21 with recent covid 19 infection, recurrent UTI's. She had progressively declined since covid infection. Patient saw Dr. Vu infectious disease and he referred her to [facility] for long covid.  Patient comes in with her daughter. Patient is tired all the time. Denies chest pain, shortness of breath, dizziness, or syncope. Having trouble swallowing and was told she needed her esophagus stretched. She sees PCP tomorrow. She says she doesn't believe in covid19. Trying to wean off prednisone.     Past Medical History:  Diagnosis Date   Atrial fibrillation (HCC)    Prior treatment with amiodarone, held sinus rhythm and drug stopped   Atrial flutter (HCC)    Status post ablation prior to  2009   Chest pain    Nuclear, 2004, no scar or ischemia, EF 77%   Ejection fraction    EF 70%, nuclear,  2004  //      Fall    July, 2014   FUO (fever of unknown origin) 11/15/2021   H/O bladder repair surgery    OSA (obstructive sleep apnea)    Right Achilles tendinitis    Sciatica    Current Medications: Current Meds  Medication Sig   acetaminophen (TYLENOL) 325 MG tablet Take 2 tablets (650 mg total) by mouth every 6 (six) hours as needed for mild pain, fever or headache.   amLODipine (NORVASC) 2.5 MG tablet Take 1 tablet (2.5 mg total) by mouth daily.   apixaban (ELIQUIS) 5 MG  TABS tablet Take 1 tablet (5 mg total) by mouth 2 (two) times daily.   ascorbic acid (VITAMIN C) 500 MG tablet Take 1 tablet (500 mg total) by mouth daily.   Cholecalciferol (VITAMIN D3) 50 MCG (2000 UT) CAPS Take 2,000 Units by mouth at bedtime.   GEMTESA 75 MG TABS Take 75 mg by mouth daily.   Ipratropium-Albuterol (COMBIVENT) 20-100 MCG/ACT AERS respimat Inhale 1 puff into the lungs every 6 (six) hours as needed for wheezing or shortness of breath.   mirtazapine (REMERON) 7.5 MG tablet Take 1 tablet (7.5 mg total) by mouth at bedtime.   Multiple Vitamins-Minerals (SENIOR VITES PO) Take 1 tablet by mouth daily with breakfast.   ondansetron (ZOFRAN) 4 MG tablet Take 1 tablet (4 mg total) by mouth every 6 (six) hours as needed for nausea.   polyethylene glycol (MIRALAX / GLYCOLAX) 17 g packet Take 17 g by mouth 2 (two) times daily as needed for mild constipation.   Probiotic Product (PHILLIPS COLON HEALTH) CAPS Take 1 capsule by mouth daily.   senna-docusate (SENOKOT-S) 8.6-50 MG tablet Take 1 tablet by mouth 2 (two) times daily as needed for moderate constipation.   zinc sulfate 220 (50 Zn) MG capsule Take 1 capsule (220 mg total) by mouth daily.    Allergies:   Bee   venom, Mixed vespid venom, Codeine, Sulfa antibiotics, and Erythromycin   Social History   Tobacco Use   Smoking status: Former    Packs/day: 1.00    Years: 10.00    Total pack years: 10.00    Types: Cigarettes   Smokeless tobacco: Never  Vaping Use   Vaping Use: Never used  Substance Use Topics   Alcohol use: Yes   Drug use: Never    Family Hx: The patient's family history includes Cancer in her father and mother; Heart disease in her brother.  ROS     Physical Exam:    VS:  BP (!) 120/58   Pulse 81   Ht 5' (1.524 m)   Wt 150 lb 9.6 oz (68.3 kg)   LMP  (LMP Unknown)   SpO2 94%   BMI 29.41 kg/m     Wt Readings from Last 3 Encounters:  12/22/21 150 lb 9.6 oz (68.3 kg)  12/15/21 150 lb 9.6 oz (68.3 kg)   11/19/21 150 lb (68 kg)    Physical Exam  GEN: Well nourished, well developed, in no acute distress  Neck: no JVD, carotid bruits, or masses Cardiac:RRR; no murmurs, rubs, or gallops  Respiratory:  clear to auscultation bilaterally, normal work of breathing GI: soft, nontender, nondistended, + BS Ext: without cyanosis, clubbing, or edema, Good distal pulses bilaterally Neuro:  Alert and Oriented x 3,  Psych: euthymic mood, full affect        EKGs/Labs/Other Test Reviewed:    EKG:  EKG is  not ordered today.     Recent Labs: 11/20/2021: TSH 2.638 11/25/2021: Magnesium 2.2 11/27/2021: ALT 54; BUN 19; Creatinine, Ser 0.65; Hemoglobin 9.7; Platelets 380; Potassium 4.3; Sodium 139   Recent Lipid Panel Recent Labs    05/07/21 0000  CHOL 123  TRIG 107  HDL 58  LDLCALC 46     Prior CV Studies:   Monitor 04/2021 Atrial Fibrillation/Flutter occurred continuously (100% burden), ranging from 33-119 bpm (avg of 56 bpm). 1 Pause occurred lasting 3 secs (20 bpm). Isolated VEs were rare (<1.0%, 3264), VE Couplets were rare (<1.0%, 67), and VE Triplets were rare (<1.0%, 2). Ventricular Bigeminy and Trigeminy were present. MD notification criteria for Slow Atrial Fibrillation/Flutter met - report posted prior to notification per account   Risk Assessment/Calculations/Metrics:    CHA2DS2-VASc Score = 6   This indicates a 9.7% annual risk of stroke. The patient's score is based upon: CHF History: 0 HTN History: 0 Diabetes History: 0 Stroke History: 2 Vascular Disease History: 1 Age Score: 2 Gender Score: 1             ASSESSMENT & PLAN:   No problem-specific Assessment & Plan notes found for this encounter.        Tachycardia-bradycardia syndrome ([place]) zio 04/2021: Atrial Fibrillation/Flutter occurred continuously (100% burden), ranging from 33-119 bpm (avg of 56 bpm) on monitor 05/2021. No palpitations, dizziness or bleeding problems on  Eliquis   Atrial flutter  [facility]) Ablation in 2007 Dr. Lovena Le.   History of stroke Continued fatigue and weakness.  Continue daily exercise   Hyperlipidemia LDL goal <70 Currently on atorvastatin 10 mg, decrease dose based upon age.  Has aortic atherosclerosis.  Continue with treatment.  Prior stroke.  Last LDL 46. No myalgias.   Obstructive sleep apnea Dr. Joylene Draft has been following.  Wearing CPAP reluctantly she states.  Covid 19 infection and recurrent UTI's, -hospitalized 10/2021 and 11/2021 and now referred to [facility] long covid clinic b/c of  significant fatigue.      Dispo:  No follow-ups on file.   Medication Adjustments/Labs and Tests Ordered: Current medicines are reviewed at length with the patient today.  Concerns regarding medicines are outlined above.  Tests Ordered: No orders of the defined types were placed in this encounter.  Medication Changes: No orders of the defined types were placed in this encounter.  Signed, Ermalinda Barrios, PA-C  12/22/2021 2:03 PM    [address], [place], [place]  [address] Phone: [phone]; Fax: [phone]

## 2021-12-22 ENCOUNTER — Encounter: Payer: Self-pay | Admitting: Physician Assistant

## 2021-12-22 ENCOUNTER — Ambulatory Visit: Payer: Medicare Other | Attending: Physician Assistant | Admitting: Physician Assistant

## 2021-12-22 VITALS — BP 120/58 | HR 81 | Ht 60.0 in | Wt 150.6 lb

## 2021-12-22 DIAGNOSIS — M1990 Unspecified osteoarthritis, unspecified site: Secondary | ICD-10-CM | POA: Diagnosis not present

## 2021-12-22 DIAGNOSIS — I483 Typical atrial flutter: Secondary | ICD-10-CM | POA: Insufficient documentation

## 2021-12-22 DIAGNOSIS — E785 Hyperlipidemia, unspecified: Secondary | ICD-10-CM | POA: Insufficient documentation

## 2021-12-22 DIAGNOSIS — Z8673 Personal history of transient ischemic attack (TIA), and cerebral infarction without residual deficits: Secondary | ICD-10-CM | POA: Diagnosis not present

## 2021-12-22 DIAGNOSIS — I63431 Cerebral infarction due to embolism of right posterior cerebral artery: Secondary | ICD-10-CM | POA: Diagnosis not present

## 2021-12-22 DIAGNOSIS — U071 COVID-19: Secondary | ICD-10-CM | POA: Insufficient documentation

## 2021-12-22 DIAGNOSIS — I495 Sick sinus syndrome: Secondary | ICD-10-CM | POA: Insufficient documentation

## 2021-12-22 DIAGNOSIS — G4733 Obstructive sleep apnea (adult) (pediatric): Secondary | ICD-10-CM | POA: Insufficient documentation

## 2021-12-22 DIAGNOSIS — M5116 Intervertebral disc disorders with radiculopathy, lumbar region: Secondary | ICD-10-CM | POA: Diagnosis not present

## 2021-12-22 DIAGNOSIS — M5431 Sciatica, right side: Secondary | ICD-10-CM | POA: Diagnosis not present

## 2021-12-22 DIAGNOSIS — D6869 Other thrombophilia: Secondary | ICD-10-CM | POA: Diagnosis not present

## 2021-12-22 DIAGNOSIS — I480 Paroxysmal atrial fibrillation: Secondary | ICD-10-CM | POA: Diagnosis not present

## 2021-12-22 DIAGNOSIS — E871 Hypo-osmolality and hyponatremia: Secondary | ICD-10-CM | POA: Diagnosis not present

## 2021-12-22 NOTE — Patient Instructions (Signed)
Medication Instructions:  Your physician recommends that you continue on your current medications as directed. Please refer to the Current Medication list given to you today.  *If you need a refill on your cardiac medications before your next appointment, please call your pharmacy*   Lab Work: NONE If you have labs (blood work) drawn today and your tests are completely normal, you will receive your results only by: [place] (if you have MyChart) OR A paper copy in the mail If you have any lab test that is abnormal or we need to change your treatment, we will call you to review the results.   Testing/Procedures: NONE   Follow-Up: At [facility], you and your health needs are our priority.  As part of our continuing mission to provide you with exceptional heart care, we have created designated Provider Care Teams.  These Care Teams include your primary Cardiologist (physician) and Advanced Practice Providers (APPs -  Physician Assistants and Nurse Practitioners) who all work together to provide you with the care you need, when you need it.  Your next appointment:   6 month(s)  The format for your next appointment:   In Person  Provider:   Marlou Porch, MD      Important Information About [place]

## 2021-12-23 ENCOUNTER — Telehealth: Payer: Self-pay | Admitting: *Deleted

## 2021-12-23 DIAGNOSIS — J849 Interstitial pulmonary disease, unspecified: Secondary | ICD-10-CM | POA: Diagnosis not present

## 2021-12-23 DIAGNOSIS — I480 Paroxysmal atrial fibrillation: Secondary | ICD-10-CM | POA: Diagnosis not present

## 2021-12-23 DIAGNOSIS — R7301 Impaired fasting glucose: Secondary | ICD-10-CM | POA: Diagnosis not present

## 2021-12-23 DIAGNOSIS — R1310 Dysphagia, unspecified: Secondary | ICD-10-CM | POA: Diagnosis not present

## 2021-12-23 DIAGNOSIS — Z8673 Personal history of transient ischemic attack (TIA), and cerebral infarction without residual deficits: Secondary | ICD-10-CM | POA: Diagnosis not present

## 2021-12-23 DIAGNOSIS — U071 COVID-19: Secondary | ICD-10-CM | POA: Diagnosis not present

## 2021-12-23 DIAGNOSIS — D692 Other nonthrombocytopenic purpura: Secondary | ICD-10-CM | POA: Diagnosis not present

## 2021-12-23 DIAGNOSIS — M791 Myalgia, unspecified site: Secondary | ICD-10-CM | POA: Diagnosis not present

## 2021-12-23 DIAGNOSIS — N1831 Chronic kidney disease, stage 3a: Secondary | ICD-10-CM | POA: Diagnosis not present

## 2021-12-23 DIAGNOSIS — E871 Hypo-osmolality and hyponatremia: Secondary | ICD-10-CM | POA: Diagnosis not present

## 2021-12-23 DIAGNOSIS — N390 Urinary tract infection, site not specified: Secondary | ICD-10-CM | POA: Diagnosis not present

## 2021-12-23 DIAGNOSIS — E2740 Unspecified adrenocortical insufficiency: Secondary | ICD-10-CM | POA: Diagnosis not present

## 2021-12-23 DIAGNOSIS — D6869 Other thrombophilia: Secondary | ICD-10-CM | POA: Diagnosis not present

## 2021-12-23 NOTE — Progress Notes (Signed)
  Care Coordination   Note   12/23/2021 Name: [name] MRN: [id number] DOB: 09-02-1931  [name] is a 88 y.o. year old female who sees Crist Infante, MD for primary care. I reached out to [name] by phone today to offer care coordination services.  [name] was given information about Care Coordination services today including:   The Care Coordination services include support from the care team which includes your Nurse Coordinator, Clinical Social Worker, or Pharmacist.  The Care Coordination team is here to help remove barriers to the health concerns and goals most important to you. Care Coordination services are voluntary, and the patient may decline or stop services at any time by request to their care team member.   Care Coordination Consent Status: Patient daughter [name] agreed to services and verbal consent obtained.   Follow up plan:  Telephone appointment with care coordination team member scheduled for:  12/28/21  Encounter Outcome:  Pt. Scheduled  [place]  Direct Dial: [phone]

## 2021-12-24 ENCOUNTER — Encounter: Payer: Self-pay | Admitting: Internal Medicine

## 2021-12-24 DIAGNOSIS — M5116 Intervertebral disc disorders with radiculopathy, lumbar region: Secondary | ICD-10-CM | POA: Diagnosis not present

## 2021-12-24 DIAGNOSIS — M5431 Sciatica, right side: Secondary | ICD-10-CM | POA: Diagnosis not present

## 2021-12-24 DIAGNOSIS — M1990 Unspecified osteoarthritis, unspecified site: Secondary | ICD-10-CM | POA: Diagnosis not present

## 2021-12-24 DIAGNOSIS — I480 Paroxysmal atrial fibrillation: Secondary | ICD-10-CM | POA: Diagnosis not present

## 2021-12-24 DIAGNOSIS — E871 Hypo-osmolality and hyponatremia: Secondary | ICD-10-CM | POA: Diagnosis not present

## 2021-12-24 DIAGNOSIS — D6869 Other thrombophilia: Secondary | ICD-10-CM | POA: Diagnosis not present

## 2021-12-25 DIAGNOSIS — R7301 Impaired fasting glucose: Secondary | ICD-10-CM | POA: Diagnosis not present

## 2021-12-25 DIAGNOSIS — B952 Enterococcus as the cause of diseases classified elsewhere: Secondary | ICD-10-CM | POA: Diagnosis not present

## 2021-12-25 DIAGNOSIS — M5416 Radiculopathy, lumbar region: Secondary | ICD-10-CM | POA: Diagnosis not present

## 2021-12-25 DIAGNOSIS — D7589 Other specified diseases of blood and blood-forming organs: Secondary | ICD-10-CM | POA: Diagnosis not present

## 2021-12-25 DIAGNOSIS — E871 Hypo-osmolality and hyponatremia: Secondary | ICD-10-CM | POA: Diagnosis not present

## 2021-12-25 DIAGNOSIS — E785 Hyperlipidemia, unspecified: Secondary | ICD-10-CM | POA: Diagnosis not present

## 2021-12-25 DIAGNOSIS — N341 Nonspecific urethritis: Secondary | ICD-10-CM | POA: Diagnosis not present

## 2021-12-25 DIAGNOSIS — M62830 Muscle spasm of back: Secondary | ICD-10-CM | POA: Diagnosis not present

## 2021-12-25 DIAGNOSIS — I480 Paroxysmal atrial fibrillation: Secondary | ICD-10-CM | POA: Diagnosis not present

## 2021-12-25 DIAGNOSIS — D6869 Other thrombophilia: Secondary | ICD-10-CM | POA: Diagnosis not present

## 2021-12-25 DIAGNOSIS — J439 Emphysema, unspecified: Secondary | ICD-10-CM | POA: Diagnosis not present

## 2021-12-25 DIAGNOSIS — M5431 Sciatica, right side: Secondary | ICD-10-CM | POA: Diagnosis not present

## 2021-12-25 DIAGNOSIS — R1310 Dysphagia, unspecified: Secondary | ICD-10-CM | POA: Diagnosis not present

## 2021-12-25 DIAGNOSIS — D72829 Elevated white blood cell count, unspecified: Secondary | ICD-10-CM | POA: Diagnosis not present

## 2021-12-25 DIAGNOSIS — I729 Aneurysm of unspecified site: Secondary | ICD-10-CM | POA: Diagnosis not present

## 2021-12-25 DIAGNOSIS — D692 Other nonthrombocytopenic purpura: Secondary | ICD-10-CM | POA: Diagnosis not present

## 2021-12-25 DIAGNOSIS — N1831 Chronic kidney disease, stage 3a: Secondary | ICD-10-CM | POA: Diagnosis not present

## 2021-12-25 DIAGNOSIS — M1990 Unspecified osteoarthritis, unspecified site: Secondary | ICD-10-CM | POA: Diagnosis not present

## 2021-12-25 DIAGNOSIS — I129 Hypertensive chronic kidney disease with stage 1 through stage 4 chronic kidney disease, or unspecified chronic kidney disease: Secondary | ICD-10-CM | POA: Diagnosis not present

## 2021-12-25 DIAGNOSIS — I69320 Aphasia following cerebral infarction: Secondary | ICD-10-CM | POA: Diagnosis not present

## 2021-12-25 DIAGNOSIS — H5123 Internuclear ophthalmoplegia, bilateral: Secondary | ICD-10-CM | POA: Diagnosis not present

## 2021-12-25 DIAGNOSIS — N390 Urinary tract infection, site not specified: Secondary | ICD-10-CM | POA: Diagnosis not present

## 2021-12-25 DIAGNOSIS — J310 Chronic rhinitis: Secondary | ICD-10-CM | POA: Diagnosis not present

## 2021-12-25 DIAGNOSIS — G4733 Obstructive sleep apnea (adult) (pediatric): Secondary | ICD-10-CM | POA: Diagnosis not present

## 2021-12-25 DIAGNOSIS — H60502 Unspecified acute noninfective otitis externa, left ear: Secondary | ICD-10-CM | POA: Diagnosis not present

## 2021-12-28 ENCOUNTER — Ambulatory Visit: Payer: Self-pay

## 2021-12-28 DIAGNOSIS — N390 Urinary tract infection, site not specified: Secondary | ICD-10-CM | POA: Diagnosis not present

## 2021-12-28 DIAGNOSIS — D692 Other nonthrombocytopenic purpura: Secondary | ICD-10-CM | POA: Diagnosis not present

## 2021-12-28 DIAGNOSIS — M5431 Sciatica, right side: Secondary | ICD-10-CM | POA: Diagnosis not present

## 2021-12-28 DIAGNOSIS — B952 Enterococcus as the cause of diseases classified elsewhere: Secondary | ICD-10-CM | POA: Diagnosis not present

## 2021-12-28 DIAGNOSIS — D6869 Other thrombophilia: Secondary | ICD-10-CM | POA: Diagnosis not present

## 2021-12-28 DIAGNOSIS — M5416 Radiculopathy, lumbar region: Secondary | ICD-10-CM | POA: Diagnosis not present

## 2021-12-28 NOTE — Patient Instructions (Addendum)
Visit Information  Thank you for taking time to visit with me today. Please don't hesitate to contact me if I can be of assistance to you.   Following are the goals we discussed today:   Goals Addressed               This Visit's Progress     Patient Stated     PCP has referred mom to several specialists (pt-stated)        Care Coordination Interventions: Placed successful outbound call to daughter [name] Evaluation of current treatment plan related to long COVID and patient's adherence to plan as established by provider Determined patient has experienced several relapses of multiple symptoms when weaned from oral Prednisone Determined PCP is following and long COVID is suspected Review of patient status, including review of consultant's reports, relevant laboratory and other test results, and medications completed Noted PCP referrals have been entered for Pulmonology, Gastroenterology, Infectious disease and Endocrinology Assessed for SDOH needs, identified need for caregiver assistance per daughter [name], sent SW referral to [place] patient is having difficulty paying for Eliquis and [facility] referral sent            Our next appointment is by telephone on 01/26/22 at 0100 PM  Please call the care guide team at [phone] if you need to cancel or reschedule your appointment.   If you are experiencing a Mental Health or [place] or need someone to talk to, please call 1-800-273-TALK (toll free, 24 hour hotline)  Patient verbalizes understanding of instructions and care plan provided today and agrees to view in [place]. Active MyChart status and patient understanding of how to access instructions and care plan via MyChart confirmed with patient.     Barb Merino, RN, BSN, CCM Care Management Coordinator [facility] Care Management  Direct Phone: [phone]

## 2021-12-28 NOTE — Patient Outreach (Addendum)
  Care Coordination   Initial Visit Note   12/28/2021 Name: [name] MRN: [id number] DOB: 11-26-1931  [name] is a 88 y.o. year old female who sees Melissa Infante, MD for primary care. I spoke with daughter [name] by phone today.  What matters to the patients health and wellness today?  Daughter [name] would like help with care coordination related to recent PCP referrals to ensure patient receives adequate care.     Goals Addressed               This Visit's Progress     Patient Stated     PCP has referred mom to several specialists (pt-stated)        Care Coordination Interventions: Placed successful outbound call to daughter [name] Evaluation of current treatment plan related to long COVID and patient's adherence to plan as established by provider Determined patient has experienced several relapses of multiple symptoms when weaned from oral Prednisone Determined PCP is following and long COVID is suspected Review of patient status, including review of consultant's reports, relevant laboratory and other test results, and medications completed Noted PCP referrals have been entered for Pulmonology, Gastroenterology, Infectious disease and Endocrinology Assessed for SDOH needs, identified need for caregiver assistance per daughter [name], sent SW referral to [place] patient is having difficulty paying for Eliquis and [name] [place] referral sent            SDOH assessments and interventions completed:  Yes     Care Coordination Interventions:  Yes, provided   Follow up plan: Referral made to [place] for resources for caregiver assistance ; [place] referral sent to assist with resources to help with cost of Gemtesa and Eliquis  Follow up call scheduled for 01/26/22 '@100'$  PM    Encounter Outcome:  Pt. Visit Completed

## 2021-12-29 DIAGNOSIS — N390 Urinary tract infection, site not specified: Secondary | ICD-10-CM | POA: Diagnosis not present

## 2021-12-29 DIAGNOSIS — B952 Enterococcus as the cause of diseases classified elsewhere: Secondary | ICD-10-CM | POA: Diagnosis not present

## 2021-12-29 DIAGNOSIS — D692 Other nonthrombocytopenic purpura: Secondary | ICD-10-CM | POA: Diagnosis not present

## 2021-12-29 DIAGNOSIS — M5416 Radiculopathy, lumbar region: Secondary | ICD-10-CM | POA: Diagnosis not present

## 2021-12-29 DIAGNOSIS — M5431 Sciatica, right side: Secondary | ICD-10-CM | POA: Diagnosis not present

## 2021-12-29 DIAGNOSIS — D6869 Other thrombophilia: Secondary | ICD-10-CM | POA: Diagnosis not present

## 2021-12-30 DIAGNOSIS — B952 Enterococcus as the cause of diseases classified elsewhere: Secondary | ICD-10-CM | POA: Diagnosis not present

## 2021-12-30 DIAGNOSIS — M5431 Sciatica, right side: Secondary | ICD-10-CM | POA: Diagnosis not present

## 2021-12-30 DIAGNOSIS — N390 Urinary tract infection, site not specified: Secondary | ICD-10-CM | POA: Diagnosis not present

## 2021-12-30 DIAGNOSIS — D692 Other nonthrombocytopenic purpura: Secondary | ICD-10-CM | POA: Diagnosis not present

## 2021-12-30 DIAGNOSIS — M5416 Radiculopathy, lumbar region: Secondary | ICD-10-CM | POA: Diagnosis not present

## 2021-12-30 DIAGNOSIS — D6869 Other thrombophilia: Secondary | ICD-10-CM | POA: Diagnosis not present

## 2021-12-31 DIAGNOSIS — D6869 Other thrombophilia: Secondary | ICD-10-CM | POA: Diagnosis not present

## 2021-12-31 DIAGNOSIS — M5416 Radiculopathy, lumbar region: Secondary | ICD-10-CM | POA: Diagnosis not present

## 2021-12-31 DIAGNOSIS — N390 Urinary tract infection, site not specified: Secondary | ICD-10-CM | POA: Diagnosis not present

## 2021-12-31 DIAGNOSIS — M5431 Sciatica, right side: Secondary | ICD-10-CM | POA: Diagnosis not present

## 2021-12-31 DIAGNOSIS — B952 Enterococcus as the cause of diseases classified elsewhere: Secondary | ICD-10-CM | POA: Diagnosis not present

## 2021-12-31 DIAGNOSIS — D692 Other nonthrombocytopenic purpura: Secondary | ICD-10-CM | POA: Diagnosis not present

## 2022-01-04 ENCOUNTER — Ambulatory Visit: Payer: Self-pay

## 2022-01-04 DIAGNOSIS — D6869 Other thrombophilia: Secondary | ICD-10-CM | POA: Diagnosis not present

## 2022-01-04 DIAGNOSIS — M5416 Radiculopathy, lumbar region: Secondary | ICD-10-CM | POA: Diagnosis not present

## 2022-01-04 DIAGNOSIS — D692 Other nonthrombocytopenic purpura: Secondary | ICD-10-CM | POA: Diagnosis not present

## 2022-01-04 DIAGNOSIS — B952 Enterococcus as the cause of diseases classified elsewhere: Secondary | ICD-10-CM | POA: Diagnosis not present

## 2022-01-04 DIAGNOSIS — U071 COVID-19: Secondary | ICD-10-CM | POA: Diagnosis not present

## 2022-01-04 DIAGNOSIS — N390 Urinary tract infection, site not specified: Secondary | ICD-10-CM | POA: Diagnosis not present

## 2022-01-04 DIAGNOSIS — M5431 Sciatica, right side: Secondary | ICD-10-CM | POA: Diagnosis not present

## 2022-01-04 NOTE — Patient Instructions (Addendum)
Visit Information  Thank you for taking time to visit with me today. Please don't hesitate to contact me if I can be of assistance to you.   Following are the goals we discussed today:   Goals Addressed             This Visit's Progress    COMPLETED: Care Coordination Activities       Care Coordination Interventions: Collaboration with [place] who requests SW contact the patients daughter and caregiver [name] to assist with caregiver resources options Discussed with [name] the patients health plan does not cover the cost of an in home aid and patient does not qualify for Medicaid PCS Determined the patient is able to cover the cost of an in home aid Reviewed process for selecting an agency with [name] Provided [name] for contacts to several local agencies that serve the patients county [facility] [place] to contact SW directly as needed         If you are experiencing a Mental Health or [place] or need someone to talk to, please go to [facility] [address] [place] [phone])  Patient verbalizes understanding of instructions and care plan provided today and agrees to view in [place]. Active MyChart status and patient understanding of how to access instructions and care plan via MyChart confirmed with patient.     No further follow up required: Please contact me as needed. You will be contacted by [place] on 01/26/22 as previously scheduled.  Daneen Schick, BSW, CDP Social Worker, Certified Dementia Practitioner [facility]  Care Coordination [phone]

## 2022-01-04 NOTE — Patient Outreach (Signed)
  Care Coordination   Follow Up Visit Note   01/04/2022 Name: [name] MRN: [id number] DOB: 01-20-1932  [name] is a 88 y.o. year old female who sees Melissa Infante, MD for primary care. I  spoke with patients daughter and caregiver [name] by phone.  What matters to the patients health and wellness today?  Caregiver resource options.    Goals Addressed             This Visit's Progress    COMPLETED: Care Coordination Activities       Care Coordination Interventions: Collaboration with [name] who requests SW contact the patients daughter and caregiver [name] to assist with caregiver resources options Discussed with [name] the patients health plan does not cover the cost of an in home aid and patient does not qualify for Medicaid PCS Determined the patient is able to cover the cost of an in home aid Reviewed process for selecting an agency with [name] Provided [name] for contacts to several local agencies that serve the patients county [facility] [place] to contact SW directly as needed         SDOH assessments and interventions completed:  Yes  SDOH Interventions Today    Flowsheet Row Most Recent Value  SDOH Interventions   Food Insecurity Interventions Intervention Not Indicated  Housing Interventions Intervention Not Indicated  Transportation Interventions Intervention Not Indicated        Care Coordination Interventions:  Yes, provided   Follow up plan: No further intervention required.   Encounter Outcome:  Pt. Visit Completed   [name], BSW, CDP Social Worker, Certified Dementia Practitioner [place] [facility]  Care Coordination [phone]

## 2022-01-06 DIAGNOSIS — M5416 Radiculopathy, lumbar region: Secondary | ICD-10-CM | POA: Diagnosis not present

## 2022-01-06 DIAGNOSIS — B952 Enterococcus as the cause of diseases classified elsewhere: Secondary | ICD-10-CM | POA: Diagnosis not present

## 2022-01-06 DIAGNOSIS — N390 Urinary tract infection, site not specified: Secondary | ICD-10-CM | POA: Diagnosis not present

## 2022-01-06 DIAGNOSIS — M5431 Sciatica, right side: Secondary | ICD-10-CM | POA: Diagnosis not present

## 2022-01-06 DIAGNOSIS — D6869 Other thrombophilia: Secondary | ICD-10-CM | POA: Diagnosis not present

## 2022-01-06 DIAGNOSIS — D692 Other nonthrombocytopenic purpura: Secondary | ICD-10-CM | POA: Diagnosis not present

## 2022-01-11 DIAGNOSIS — D6869 Other thrombophilia: Secondary | ICD-10-CM | POA: Diagnosis not present

## 2022-01-11 DIAGNOSIS — M5416 Radiculopathy, lumbar region: Secondary | ICD-10-CM | POA: Diagnosis not present

## 2022-01-11 DIAGNOSIS — N390 Urinary tract infection, site not specified: Secondary | ICD-10-CM | POA: Diagnosis not present

## 2022-01-11 DIAGNOSIS — D692 Other nonthrombocytopenic purpura: Secondary | ICD-10-CM | POA: Diagnosis not present

## 2022-01-11 DIAGNOSIS — M5431 Sciatica, right side: Secondary | ICD-10-CM | POA: Diagnosis not present

## 2022-01-11 DIAGNOSIS — B952 Enterococcus as the cause of diseases classified elsewhere: Secondary | ICD-10-CM | POA: Diagnosis not present

## 2022-01-13 DIAGNOSIS — B952 Enterococcus as the cause of diseases classified elsewhere: Secondary | ICD-10-CM | POA: Diagnosis not present

## 2022-01-13 DIAGNOSIS — N390 Urinary tract infection, site not specified: Secondary | ICD-10-CM | POA: Diagnosis not present

## 2022-01-13 DIAGNOSIS — D692 Other nonthrombocytopenic purpura: Secondary | ICD-10-CM | POA: Diagnosis not present

## 2022-01-13 DIAGNOSIS — D6869 Other thrombophilia: Secondary | ICD-10-CM | POA: Diagnosis not present

## 2022-01-13 DIAGNOSIS — M5431 Sciatica, right side: Secondary | ICD-10-CM | POA: Diagnosis not present

## 2022-01-13 DIAGNOSIS — M5416 Radiculopathy, lumbar region: Secondary | ICD-10-CM | POA: Diagnosis not present

## 2022-01-19 ENCOUNTER — Telehealth (HOSPITAL_BASED_OUTPATIENT_CLINIC_OR_DEPARTMENT_OTHER): Payer: Self-pay | Admitting: Pulmonary Disease

## 2022-01-19 NOTE — Telephone Encounter (Signed)
Received referral from Dr Joylene Draft. HE wants for pt to f/u due to possible ILD. Pt already established with Dr Elsworth Soho. LOV 03/25/20. LMTCB with [name] (daughter) to schedule a f/u

## 2022-01-20 ENCOUNTER — Telehealth: Payer: Self-pay

## 2022-01-20 DIAGNOSIS — R7301 Impaired fasting glucose: Secondary | ICD-10-CM | POA: Diagnosis not present

## 2022-01-20 DIAGNOSIS — J849 Interstitial pulmonary disease, unspecified: Secondary | ICD-10-CM | POA: Diagnosis not present

## 2022-01-20 DIAGNOSIS — N1831 Chronic kidney disease, stage 3a: Secondary | ICD-10-CM | POA: Diagnosis not present

## 2022-01-20 DIAGNOSIS — N390 Urinary tract infection, site not specified: Secondary | ICD-10-CM | POA: Diagnosis not present

## 2022-01-20 DIAGNOSIS — M5416 Radiculopathy, lumbar region: Secondary | ICD-10-CM | POA: Diagnosis not present

## 2022-01-20 DIAGNOSIS — Z23 Encounter for immunization: Secondary | ICD-10-CM | POA: Diagnosis not present

## 2022-01-20 DIAGNOSIS — D692 Other nonthrombocytopenic purpura: Secondary | ICD-10-CM | POA: Diagnosis not present

## 2022-01-20 DIAGNOSIS — E785 Hyperlipidemia, unspecified: Secondary | ICD-10-CM | POA: Diagnosis not present

## 2022-01-20 DIAGNOSIS — G4733 Obstructive sleep apnea (adult) (pediatric): Secondary | ICD-10-CM | POA: Diagnosis not present

## 2022-01-20 DIAGNOSIS — E2740 Unspecified adrenocortical insufficiency: Secondary | ICD-10-CM | POA: Diagnosis not present

## 2022-01-20 DIAGNOSIS — Z8673 Personal history of transient ischemic attack (TIA), and cerebral infarction without residual deficits: Secondary | ICD-10-CM | POA: Diagnosis not present

## 2022-01-20 DIAGNOSIS — M5431 Sciatica, right side: Secondary | ICD-10-CM | POA: Diagnosis not present

## 2022-01-20 DIAGNOSIS — I480 Paroxysmal atrial fibrillation: Secondary | ICD-10-CM | POA: Diagnosis not present

## 2022-01-20 DIAGNOSIS — E871 Hypo-osmolality and hyponatremia: Secondary | ICD-10-CM | POA: Diagnosis not present

## 2022-01-20 DIAGNOSIS — D6869 Other thrombophilia: Secondary | ICD-10-CM | POA: Diagnosis not present

## 2022-01-20 DIAGNOSIS — B952 Enterococcus as the cause of diseases classified elsewhere: Secondary | ICD-10-CM | POA: Diagnosis not present

## 2022-01-20 DIAGNOSIS — U071 COVID-19: Secondary | ICD-10-CM | POA: Diagnosis not present

## 2022-01-20 NOTE — Telephone Encounter (Signed)
  Follow up on Referral for adrenal insuffiencey sent 12/23/21 [facility] referral [employer]  [phone] ext:[phone]

## 2022-01-22 DIAGNOSIS — M5431 Sciatica, right side: Secondary | ICD-10-CM | POA: Diagnosis not present

## 2022-01-22 DIAGNOSIS — D6869 Other thrombophilia: Secondary | ICD-10-CM | POA: Diagnosis not present

## 2022-01-22 DIAGNOSIS — B952 Enterococcus as the cause of diseases classified elsewhere: Secondary | ICD-10-CM | POA: Diagnosis not present

## 2022-01-22 DIAGNOSIS — M5416 Radiculopathy, lumbar region: Secondary | ICD-10-CM | POA: Diagnosis not present

## 2022-01-22 DIAGNOSIS — N390 Urinary tract infection, site not specified: Secondary | ICD-10-CM | POA: Diagnosis not present

## 2022-01-22 DIAGNOSIS — D692 Other nonthrombocytopenic purpura: Secondary | ICD-10-CM | POA: Diagnosis not present

## 2022-01-24 DIAGNOSIS — R1310 Dysphagia, unspecified: Secondary | ICD-10-CM | POA: Diagnosis not present

## 2022-01-24 DIAGNOSIS — M5431 Sciatica, right side: Secondary | ICD-10-CM | POA: Diagnosis not present

## 2022-01-24 DIAGNOSIS — I129 Hypertensive chronic kidney disease with stage 1 through stage 4 chronic kidney disease, or unspecified chronic kidney disease: Secondary | ICD-10-CM | POA: Diagnosis not present

## 2022-01-24 DIAGNOSIS — J439 Emphysema, unspecified: Secondary | ICD-10-CM | POA: Diagnosis not present

## 2022-01-24 DIAGNOSIS — G4733 Obstructive sleep apnea (adult) (pediatric): Secondary | ICD-10-CM | POA: Diagnosis not present

## 2022-01-24 DIAGNOSIS — E871 Hypo-osmolality and hyponatremia: Secondary | ICD-10-CM | POA: Diagnosis not present

## 2022-01-24 DIAGNOSIS — N1831 Chronic kidney disease, stage 3a: Secondary | ICD-10-CM | POA: Diagnosis not present

## 2022-01-24 DIAGNOSIS — I480 Paroxysmal atrial fibrillation: Secondary | ICD-10-CM | POA: Diagnosis not present

## 2022-01-24 DIAGNOSIS — M62830 Muscle spasm of back: Secondary | ICD-10-CM | POA: Diagnosis not present

## 2022-01-24 DIAGNOSIS — D72829 Elevated white blood cell count, unspecified: Secondary | ICD-10-CM | POA: Diagnosis not present

## 2022-01-24 DIAGNOSIS — I69320 Aphasia following cerebral infarction: Secondary | ICD-10-CM | POA: Diagnosis not present

## 2022-01-24 DIAGNOSIS — N341 Nonspecific urethritis: Secondary | ICD-10-CM | POA: Diagnosis not present

## 2022-01-24 DIAGNOSIS — D6869 Other thrombophilia: Secondary | ICD-10-CM | POA: Diagnosis not present

## 2022-01-24 DIAGNOSIS — D7589 Other specified diseases of blood and blood-forming organs: Secondary | ICD-10-CM | POA: Diagnosis not present

## 2022-01-24 DIAGNOSIS — N390 Urinary tract infection, site not specified: Secondary | ICD-10-CM | POA: Diagnosis not present

## 2022-01-24 DIAGNOSIS — E785 Hyperlipidemia, unspecified: Secondary | ICD-10-CM | POA: Diagnosis not present

## 2022-01-24 DIAGNOSIS — M5416 Radiculopathy, lumbar region: Secondary | ICD-10-CM | POA: Diagnosis not present

## 2022-01-24 DIAGNOSIS — H5123 Internuclear ophthalmoplegia, bilateral: Secondary | ICD-10-CM | POA: Diagnosis not present

## 2022-01-24 DIAGNOSIS — J310 Chronic rhinitis: Secondary | ICD-10-CM | POA: Diagnosis not present

## 2022-01-24 DIAGNOSIS — R7301 Impaired fasting glucose: Secondary | ICD-10-CM | POA: Diagnosis not present

## 2022-01-24 DIAGNOSIS — D692 Other nonthrombocytopenic purpura: Secondary | ICD-10-CM | POA: Diagnosis not present

## 2022-01-24 DIAGNOSIS — I729 Aneurysm of unspecified site: Secondary | ICD-10-CM | POA: Diagnosis not present

## 2022-01-24 DIAGNOSIS — M1990 Unspecified osteoarthritis, unspecified site: Secondary | ICD-10-CM | POA: Diagnosis not present

## 2022-01-24 DIAGNOSIS — B952 Enterococcus as the cause of diseases classified elsewhere: Secondary | ICD-10-CM | POA: Diagnosis not present

## 2022-01-24 DIAGNOSIS — H60502 Unspecified acute noninfective otitis externa, left ear: Secondary | ICD-10-CM | POA: Diagnosis not present

## 2022-01-26 ENCOUNTER — Ambulatory Visit: Payer: Self-pay

## 2022-01-26 NOTE — Patient Instructions (Signed)
Visit Information  Thank you for taking time to visit with me today. Please don't hesitate to contact me if I can be of assistance to you.   Following are the goals we discussed today:   Goals Addressed               This Visit's Progress     Patient Stated     PCP has referred mom to several specialists (pt-stated)        Care Coordination Interventions: Placed successful outbound call to daughter [name] Evaluation of current treatment plan related to long COVID and patient's adherence to plan as established by provider Determined patient has not received a call from [facility] Endocrinology for evaluation of adrenal insufficiency Reviewed Endo referral status with daughter [name] including the contact number and address for this provider (sent via my chart) Placed outbound call to [facility] Endocrinology to f/u on referral status, determined the referral has been authorized but is awaiting on provider review, after which the patient/daughter will be contacted to schedule the initial consultation  Determined patient completed PCP and ID follow up appointments as directed  Review of patient status, including review of consultant's reports, relevant laboratory and other test results Determined patient is not wearing her CPAP due to she does not feel it improves her fatigue Discussed daughter [name] will continue to encourage patient to wear her CPAP as directed including during nap times Discussed patient is on the cancellation list for her GI follow up due to next appointment is scheduled for February during the time the patient/family plan to so some traveling            [place] next appointment is by telephone on 03/29/22 at 11:00 AM  Please call the care guide team at [phone] if you need to cancel or reschedule your appointment.   If you are experiencing a Mental Health or [place] or need someone to talk to, please go to [facility]  Urgent Care [address] [place] [phone])  Patient verbalizes understanding of instructions and care plan provided today and agrees to view in [place]. Active MyChart status and patient understanding of how to access instructions and care plan via MyChart confirmed with patient.     Barb Merino, RN, BSN, CCM Care Management Coordinator [facility] Care Management  Direct Phone: [phone]

## 2022-01-26 NOTE — Patient Outreach (Signed)
  Care Coordination   Follow Up Visit Note   01/26/2022 Name: [name] MRN: [id number] DOB: 1931-09-02  [name] is a 89 y.o. year old female who sees Crist Infante, MD for primary care. I spoke with daughter Gordy Savers by phone today.  What matters to the patients health and wellness today?  Patient will follow up with GI and Endocrinology as directed.     Goals Addressed               This Visit's Progress     Patient Stated     PCP has referred mom to several specialists (pt-stated)        Care Coordination Interventions: Placed successful outbound call to daughter [name] Evaluation of current treatment plan related to long COVID and patient's adherence to plan as established by provider Determined patient has not received a call from [facility] Endocrinology for evaluation of adrenal insufficiency Reviewed Endo referral status with daughter [name] including the contact number and address for this provider (sent via my chart) Placed outbound call to [facility] Endocrinology to f/u on referral status, determined the referral has been authorized but is awaiting on provider review, after which the patient/daughter will be contacted to schedule the initial consultation  Determined patient completed PCP and ID follow up appointments as directed  Review of patient status, including review of consultant's reports, relevant laboratory and other test results Determined patient is not wearing her CPAP due to she does not feel it improves her fatigue Discussed daughter [name] will continue to encourage patient to wear her CPAP as directed including during nap times Discussed patient is on the cancellation list for her GI follow up due to next appointment is scheduled for February during the time the patient/family plan to so some traveling            SDOH assessments and interventions completed:  No     Care Coordination Interventions:  Yes, provided   Follow up  plan: Follow up call scheduled for 03/29/22 '@11'$ :00 AM    Encounter Outcome:  Pt. Visit Completed

## 2022-01-27 DIAGNOSIS — N3020 Other chronic cystitis without hematuria: Secondary | ICD-10-CM | POA: Diagnosis not present

## 2022-01-28 DIAGNOSIS — D692 Other nonthrombocytopenic purpura: Secondary | ICD-10-CM | POA: Diagnosis not present

## 2022-01-28 DIAGNOSIS — N390 Urinary tract infection, site not specified: Secondary | ICD-10-CM | POA: Diagnosis not present

## 2022-01-28 DIAGNOSIS — B952 Enterococcus as the cause of diseases classified elsewhere: Secondary | ICD-10-CM | POA: Diagnosis not present

## 2022-01-28 DIAGNOSIS — M5431 Sciatica, right side: Secondary | ICD-10-CM | POA: Diagnosis not present

## 2022-01-28 DIAGNOSIS — D6869 Other thrombophilia: Secondary | ICD-10-CM | POA: Diagnosis not present

## 2022-01-28 DIAGNOSIS — M5416 Radiculopathy, lumbar region: Secondary | ICD-10-CM | POA: Diagnosis not present

## 2022-01-29 DIAGNOSIS — M5431 Sciatica, right side: Secondary | ICD-10-CM | POA: Diagnosis not present

## 2022-01-29 DIAGNOSIS — B952 Enterococcus as the cause of diseases classified elsewhere: Secondary | ICD-10-CM | POA: Diagnosis not present

## 2022-01-29 DIAGNOSIS — M5416 Radiculopathy, lumbar region: Secondary | ICD-10-CM | POA: Diagnosis not present

## 2022-01-29 DIAGNOSIS — N390 Urinary tract infection, site not specified: Secondary | ICD-10-CM | POA: Diagnosis not present

## 2022-01-29 DIAGNOSIS — D692 Other nonthrombocytopenic purpura: Secondary | ICD-10-CM | POA: Diagnosis not present

## 2022-01-29 DIAGNOSIS — D6869 Other thrombophilia: Secondary | ICD-10-CM | POA: Diagnosis not present

## 2022-02-01 ENCOUNTER — Ambulatory Visit (INDEPENDENT_AMBULATORY_CARE_PROVIDER_SITE_OTHER): Payer: Medicare Other | Admitting: Pulmonary Disease

## 2022-02-01 ENCOUNTER — Encounter (HOSPITAL_BASED_OUTPATIENT_CLINIC_OR_DEPARTMENT_OTHER): Payer: Self-pay | Admitting: Pulmonary Disease

## 2022-02-01 ENCOUNTER — Telehealth: Payer: Medicare Other | Admitting: Pulmonary Disease

## 2022-02-01 ENCOUNTER — Telehealth: Payer: Self-pay | Admitting: Pulmonary Disease

## 2022-02-01 VITALS — BP 112/68 | HR 83 | Temp 97.7°F | Ht 60.0 in | Wt 146.6 lb

## 2022-02-01 DIAGNOSIS — J189 Pneumonia, unspecified organism: Secondary | ICD-10-CM | POA: Diagnosis not present

## 2022-02-01 DIAGNOSIS — J9601 Acute respiratory failure with hypoxia: Secondary | ICD-10-CM

## 2022-02-01 DIAGNOSIS — J432 Centrilobular emphysema: Secondary | ICD-10-CM | POA: Diagnosis not present

## 2022-02-01 DIAGNOSIS — J849 Interstitial pulmonary disease, unspecified: Secondary | ICD-10-CM | POA: Diagnosis not present

## 2022-02-01 MED ORDER — IPRATROPIUM-ALBUTEROL 0.5-2.5 (3) MG/3ML IN SOLN: 3.0000 mL | Freq: Four times a day (QID) | RESPIRATORY_TRACT | 3 refills | Status: DC | PRN

## 2022-02-01 NOTE — Patient Instructions (Signed)
X Amb sat  Macrodantin/ Nitrofurantoin can cause fibrosis in the lungs. Please ask urology to provide alternative antibiotic  X Rx for nebuliser + duonebs every 6h as needed for cough/ shortness of breath

## 2022-02-01 NOTE — Progress Notes (Signed)
   Subjective:    Patient ID: [name], female    DOB: 09-06-1931, 89 y.o.   MRN: [id number]  HPI  89 yo remote smoker for follow-up of COPD  hospitalization for pneumonia in December 2021, requiring oxygen   PMHx COPD (emphysema), A.Fib on Eliquis, recurrent UTIs on macrodantin 10/2019 R thalamic, cerebellar, and frontal small infarcts with right PCA occlusion s/p tPA and IR with TICI3 from AF not on AC  Initial OV 01/29/20 >> dc macrodantin, no sig desatn>> stay off O2   Chief Complaint  Patient presents with  . Follow-up    Pt states she is just here for her regular check up. Pt she has a dry cough for months.    Last OV 03/2020 >> stop macrodantin  Significant tests/ events reviewed  CT chest/A/P 10/2021 >> Increased interstitial markings are seen in the periphery of both lungs, more so in left upper lobe.  CT chest 01/21/2020 right upper lobe consolidation superimposed on emphysema small bilateral effusions   Review of Systems     Objective:   Physical Exam        Assessment & Plan:

## 2022-02-01 NOTE — Telephone Encounter (Signed)
PT daughter calling regarding PT's nebulizer script.    Ruth said the solution is not covered under her insurance. [place] said whoever we have sending the nebulizer machine to THEY should be sent the RX for the solution.

## 2022-02-02 ENCOUNTER — Ambulatory Visit: Payer: Medicare Other

## 2022-02-02 DIAGNOSIS — N390 Urinary tract infection, site not specified: Secondary | ICD-10-CM | POA: Diagnosis not present

## 2022-02-02 DIAGNOSIS — D6869 Other thrombophilia: Secondary | ICD-10-CM | POA: Diagnosis not present

## 2022-02-02 DIAGNOSIS — D692 Other nonthrombocytopenic purpura: Secondary | ICD-10-CM | POA: Diagnosis not present

## 2022-02-02 DIAGNOSIS — M5431 Sciatica, right side: Secondary | ICD-10-CM | POA: Diagnosis not present

## 2022-02-02 DIAGNOSIS — J849 Interstitial pulmonary disease, unspecified: Secondary | ICD-10-CM | POA: Insufficient documentation

## 2022-02-02 DIAGNOSIS — B952 Enterococcus as the cause of diseases classified elsewhere: Secondary | ICD-10-CM | POA: Diagnosis not present

## 2022-02-02 DIAGNOSIS — M5416 Radiculopathy, lumbar region: Secondary | ICD-10-CM | POA: Diagnosis not present

## 2022-02-02 MED ORDER — IPRATROPIUM-ALBUTEROL 0.5-2.5 (3) MG/3ML IN SOLN: 3.0000 mL | Freq: Four times a day (QID) | RESPIRATORY_TRACT | 3 refills | Status: DC | PRN

## 2022-02-02 NOTE — Telephone Encounter (Signed)
Spoke with Maudie Mercury at [facility] who states they do not file under Medicare part B which is where the Duonebs would be covered. Duonebs order sent to [facility]. Nothing further needed at this time.

## 2022-02-02 NOTE — Assessment & Plan Note (Signed)
She is unable to take Combivent inhaler. I doubt that she would do well with any kind of inhaler therapy given her cognitive decline and poor hand coordination. We will provide her with a nebulizer and  DuoNebs prescription

## 2022-02-02 NOTE — Telephone Encounter (Signed)
Spoke with [name] (pt's daughter per DPR) and notified her that Duoneb order was sent to [facility] and solution should be mailed to her home. [name] was also notified that Dr. Elsworth Soho would like pt to have CXR completed in office when pt is able to do so.  [name] stated understanding and said pt would do that. CXR order placed. Nothing further needed at this time.

## 2022-02-02 NOTE — Assessment & Plan Note (Signed)
This could be post COVID or related to Macrodantin. I have asked her to discontinue nitrofurantoin and discussed with urologist about alternative antibiotic based on urine culture sensitivity

## 2022-02-02 NOTE — Telephone Encounter (Signed)
Please have patient come in (when able) to obtain chest x-ray. This is to check on why her oxygen level was dropping and whether scarring that he noted on CT scan has gotten worse  Please always send nebulizer medication prescriptions to DME and not to pharmacy

## 2022-02-04 DIAGNOSIS — B952 Enterococcus as the cause of diseases classified elsewhere: Secondary | ICD-10-CM | POA: Diagnosis not present

## 2022-02-04 DIAGNOSIS — M5416 Radiculopathy, lumbar region: Secondary | ICD-10-CM | POA: Diagnosis not present

## 2022-02-04 DIAGNOSIS — D6869 Other thrombophilia: Secondary | ICD-10-CM | POA: Diagnosis not present

## 2022-02-04 DIAGNOSIS — M5431 Sciatica, right side: Secondary | ICD-10-CM | POA: Diagnosis not present

## 2022-02-04 DIAGNOSIS — D692 Other nonthrombocytopenic purpura: Secondary | ICD-10-CM | POA: Diagnosis not present

## 2022-02-04 DIAGNOSIS — N390 Urinary tract infection, site not specified: Secondary | ICD-10-CM | POA: Diagnosis not present

## 2022-02-05 DIAGNOSIS — R350 Frequency of micturition: Secondary | ICD-10-CM | POA: Diagnosis not present

## 2022-02-05 DIAGNOSIS — N3020 Other chronic cystitis without hematuria: Secondary | ICD-10-CM | POA: Diagnosis not present

## 2022-02-05 DIAGNOSIS — N3941 Urge incontinence: Secondary | ICD-10-CM | POA: Diagnosis not present

## 2022-02-09 ENCOUNTER — Other Ambulatory Visit: Payer: Self-pay | Admitting: Cardiology

## 2022-02-09 DIAGNOSIS — D6869 Other thrombophilia: Secondary | ICD-10-CM | POA: Diagnosis not present

## 2022-02-09 DIAGNOSIS — I480 Paroxysmal atrial fibrillation: Secondary | ICD-10-CM

## 2022-02-09 DIAGNOSIS — M5431 Sciatica, right side: Secondary | ICD-10-CM | POA: Diagnosis not present

## 2022-02-09 DIAGNOSIS — B952 Enterococcus as the cause of diseases classified elsewhere: Secondary | ICD-10-CM | POA: Diagnosis not present

## 2022-02-09 DIAGNOSIS — N390 Urinary tract infection, site not specified: Secondary | ICD-10-CM | POA: Diagnosis not present

## 2022-02-09 DIAGNOSIS — D692 Other nonthrombocytopenic purpura: Secondary | ICD-10-CM | POA: Diagnosis not present

## 2022-02-09 DIAGNOSIS — M5416 Radiculopathy, lumbar region: Secondary | ICD-10-CM | POA: Diagnosis not present

## 2022-02-09 NOTE — Telephone Encounter (Signed)
Prescription refill request for Eliquis received. Indication:afib Last office visit:11/23 Scr:0.6 Age: 89 Weight:66.5  kg  Prescription refilled

## 2022-02-15 DIAGNOSIS — N390 Urinary tract infection, site not specified: Secondary | ICD-10-CM | POA: Diagnosis not present

## 2022-02-15 DIAGNOSIS — M5431 Sciatica, right side: Secondary | ICD-10-CM | POA: Diagnosis not present

## 2022-02-15 DIAGNOSIS — B952 Enterococcus as the cause of diseases classified elsewhere: Secondary | ICD-10-CM | POA: Diagnosis not present

## 2022-02-15 DIAGNOSIS — M5416 Radiculopathy, lumbar region: Secondary | ICD-10-CM | POA: Diagnosis not present

## 2022-02-15 DIAGNOSIS — D6869 Other thrombophilia: Secondary | ICD-10-CM | POA: Diagnosis not present

## 2022-02-15 DIAGNOSIS — D692 Other nonthrombocytopenic purpura: Secondary | ICD-10-CM | POA: Diagnosis not present

## 2022-02-17 ENCOUNTER — Ambulatory Visit (INDEPENDENT_AMBULATORY_CARE_PROVIDER_SITE_OTHER): Payer: Medicare Other

## 2022-02-17 DIAGNOSIS — J439 Emphysema, unspecified: Secondary | ICD-10-CM | POA: Diagnosis not present

## 2022-02-17 DIAGNOSIS — R0602 Shortness of breath: Secondary | ICD-10-CM | POA: Diagnosis not present

## 2022-02-17 DIAGNOSIS — J189 Pneumonia, unspecified organism: Secondary | ICD-10-CM

## 2022-02-18 ENCOUNTER — Telehealth: Payer: Self-pay | Admitting: Pulmonary Disease

## 2022-02-18 NOTE — Telephone Encounter (Signed)
Confirmation was received from [facility]

## 2022-02-18 NOTE — Telephone Encounter (Signed)
Please advise, does provider still need to sign order?

## 2022-02-18 NOTE — Telephone Encounter (Signed)
PT's daughter calling. States no Nebulizer or the meds have been called in per Dr. Bari Mantis AVS notes on her last visit:  X Rx for nebuliser + duonebs every 6h as needed for cough/ shortness of breath   Please call daughter to advise @ [phone]

## 2022-02-19 ENCOUNTER — Telehealth: Payer: Self-pay | Admitting: Pulmonary Disease

## 2022-02-19 DIAGNOSIS — R7301 Impaired fasting glucose: Secondary | ICD-10-CM | POA: Diagnosis not present

## 2022-02-19 DIAGNOSIS — D519 Vitamin B12 deficiency anemia, unspecified: Secondary | ICD-10-CM | POA: Diagnosis not present

## 2022-02-19 DIAGNOSIS — G319 Degenerative disease of nervous system, unspecified: Secondary | ICD-10-CM | POA: Diagnosis not present

## 2022-02-19 DIAGNOSIS — I700 Atherosclerosis of aorta: Secondary | ICD-10-CM | POA: Diagnosis not present

## 2022-02-19 DIAGNOSIS — E2740 Unspecified adrenocortical insufficiency: Secondary | ICD-10-CM | POA: Diagnosis not present

## 2022-02-22 ENCOUNTER — Ambulatory Visit: Payer: Self-pay

## 2022-02-22 ENCOUNTER — Telehealth: Payer: Self-pay | Admitting: Pulmonary Disease

## 2022-02-22 NOTE — Telephone Encounter (Signed)
Called [facility] left voice message to contact pt's daughter about when neb medication will be shipped out. Also spk to  Gordy Savers (Daughter)  [phone] [facility])   To let her know [facility] was contacted and gave her number to [facility] pharmacy

## 2022-02-22 NOTE — Telephone Encounter (Signed)
[facility] calling. The reason they can not send script is because PT has Part A & B and they need clinical notes faxed w/diagnosis code.

## 2022-02-22 NOTE — Patient Instructions (Addendum)
Visit Information  Thank you for taking time to visit with me today. Please don't hesitate to contact me if I can be of assistance to you.   Following are the goals we discussed today:   Goals Addressed               This Visit's Progress     Patient Stated     PCP has referred mom to several specialists (pt-stated)        Care Coordination Interventions: Received inbound call from daughter [name] requesting assistance with care coordination Determined daughter [name] has been unable to determine who patient's GI referral was sent to and patient needs appointment for evaluation of dysphagia Placed outbound call to [facility], confirmed a referral was received by PCP for this patient to be seen Discussed an appointment is available tomorrow, 02/23/22 '@11'$ :00 AM, appointment accepted by this RN Placed additional outbound call to daughter [name], advised of the scheduled consultation for patient to be seen by [facility] GI provider tomorrow, 02/23/22 '@11'$ :00 AM Provided daughter with the contact number and location for this provider Determined daughter [name] will coordinate with her family in order for patient to make this appointment  Confirmed patient completed initial f/u with Dr. Forde Dandy, Endocrinologist last week to evaluate for adrenal insufficiency and lab results are pending         Our next appointment is by telephone on 03/29/22 at 11:00 AM  Please call the care guide team at [phone] if you need to cancel or reschedule your appointment.   If you are experiencing a Mental Health or [place] or need someone to talk to, please go to [facility] [address] [place] [phone])  Patient verbalizes understanding of instructions and care plan provided today and agrees to view in [place]. Active MyChart status and patient understanding of how to access instructions and care plan via MyChart confirmed with patient.      Barb Merino, RN, BSN, CCM Care Management Coordinator [facility] Care Management Direct Phone: [phone]

## 2022-02-22 NOTE — Patient Outreach (Signed)
  Care Coordination   Follow Up Visit Note   02/22/2022 Name: [name] MRN: [id number] DOB: Oct 15, 1931  [name] is a 89 y.o. year old female who sees Crist Infante, MD for primary care. I spoke with  [name] by phone today.  What matters to the patients health and wellness today?  Daughter [name] needs assistance with scheduling patient's initial GI consult for evaluation of dysphagia.     Goals Addressed               This Visit's Progress     Patient Stated     PCP has referred mom to several specialists (pt-stated)        Care Coordination Interventions: Received inbound call from daughter [name] requesting assistance with care coordination Determined daughter [name] has been unable to determine who patient's GI referral was sent to and patient needs appointment for evaluation of dysphagia Placed outbound call to [facility] GI, confirmed a referral was received by PCP for this patient to be seen Discussed an appointment is available tomorrow, 02/23/22 '@11'$ :00 AM, appointment accepted by this RN Placed additional outbound call to daughter [name], advised of the scheduled consultation for patient to be seen by [facility] GI provider tomorrow, 02/23/22 '@11'$ :00 AM Provided daughter with the contact number and location for this provider Determined daughter [name] will coordinate with her family in order for patient to make this appointment  Confirmed patient completed initial f/u with Dr. Forde Dandy, Endocrinologist last week to evaluate for adrenal insufficiency and lab results are pending         SDOH assessments and interventions completed:  No     Care Coordination Interventions:  Yes, provided   Follow up plan: Follow up call scheduled for 03/29/22 '@11'$ :00 AM    Encounter Outcome:  Pt. Visit Completed

## 2022-02-23 DIAGNOSIS — R1310 Dysphagia, unspecified: Secondary | ICD-10-CM | POA: Diagnosis not present

## 2022-02-26 MED ORDER — IPRATROPIUM-ALBUTEROL 0.5-2.5 (3) MG/3ML IN SOLN: 3.0000 mL | Freq: Four times a day (QID) | RESPIRATORY_TRACT | 3 refills | Status: DC | PRN

## 2022-02-26 NOTE — Telephone Encounter (Signed)
Rx resent to [facility] with diagnosis code.

## 2022-03-29 ENCOUNTER — Ambulatory Visit: Payer: Self-pay

## 2022-03-29 NOTE — Patient Outreach (Signed)
  Care Coordination   03/29/2022 Name: [name] MRN: [id number] DOB: [birth year]   Care Coordination Outreach Attempts:  An unsuccessful telephone outreach was attempted for a scheduled appointment today.  Follow Up Plan:  Additional outreach attempts will be made to offer the patient care coordination information and services.   Encounter Outcome:  No Answer   Care Coordination Interventions:  No, not indicated    Barb Merino, RN, BSN, CCM Care Management Coordinator [facility] Care Management  Direct Phone: [phone]

## 2022-03-30 DIAGNOSIS — N3941 Urge incontinence: Secondary | ICD-10-CM | POA: Diagnosis not present

## 2022-03-30 DIAGNOSIS — N3020 Other chronic cystitis without hematuria: Secondary | ICD-10-CM | POA: Diagnosis not present

## 2022-04-05 DIAGNOSIS — D519 Vitamin B12 deficiency anemia, unspecified: Secondary | ICD-10-CM | POA: Diagnosis not present

## 2022-04-05 DIAGNOSIS — I69320 Aphasia following cerebral infarction: Secondary | ICD-10-CM | POA: Diagnosis not present

## 2022-04-05 DIAGNOSIS — R1310 Dysphagia, unspecified: Secondary | ICD-10-CM | POA: Diagnosis not present

## 2022-04-05 DIAGNOSIS — G319 Degenerative disease of nervous system, unspecified: Secondary | ICD-10-CM | POA: Diagnosis not present

## 2022-04-05 DIAGNOSIS — Z7901 Long term (current) use of anticoagulants: Secondary | ICD-10-CM | POA: Diagnosis not present

## 2022-04-05 DIAGNOSIS — D7589 Other specified diseases of blood and blood-forming organs: Secondary | ICD-10-CM | POA: Diagnosis not present

## 2022-04-05 DIAGNOSIS — E2740 Unspecified adrenocortical insufficiency: Secondary | ICD-10-CM | POA: Diagnosis not present

## 2022-04-05 DIAGNOSIS — I700 Atherosclerosis of aorta: Secondary | ICD-10-CM | POA: Diagnosis not present

## 2022-04-05 DIAGNOSIS — I480 Paroxysmal atrial fibrillation: Secondary | ICD-10-CM | POA: Diagnosis not present

## 2022-04-05 DIAGNOSIS — D6869 Other thrombophilia: Secondary | ICD-10-CM | POA: Diagnosis not present

## 2022-04-05 DIAGNOSIS — E876 Hypokalemia: Secondary | ICD-10-CM | POA: Diagnosis not present

## 2022-04-05 DIAGNOSIS — D692 Other nonthrombocytopenic purpura: Secondary | ICD-10-CM | POA: Diagnosis not present

## 2022-04-05 DIAGNOSIS — M5416 Radiculopathy, lumbar region: Secondary | ICD-10-CM | POA: Diagnosis not present

## 2022-04-05 DIAGNOSIS — I129 Hypertensive chronic kidney disease with stage 1 through stage 4 chronic kidney disease, or unspecified chronic kidney disease: Secondary | ICD-10-CM | POA: Diagnosis not present

## 2022-04-05 DIAGNOSIS — M5430 Sciatica, unspecified side: Secondary | ICD-10-CM | POA: Diagnosis not present

## 2022-04-05 DIAGNOSIS — I69351 Hemiplegia and hemiparesis following cerebral infarction affecting right dominant side: Secondary | ICD-10-CM | POA: Diagnosis not present

## 2022-04-05 DIAGNOSIS — Z7952 Long term (current) use of systemic steroids: Secondary | ICD-10-CM | POA: Diagnosis not present

## 2022-04-05 DIAGNOSIS — E871 Hypo-osmolality and hyponatremia: Secondary | ICD-10-CM | POA: Diagnosis not present

## 2022-04-05 DIAGNOSIS — G4733 Obstructive sleep apnea (adult) (pediatric): Secondary | ICD-10-CM | POA: Diagnosis not present

## 2022-04-05 DIAGNOSIS — M1990 Unspecified osteoarthritis, unspecified site: Secondary | ICD-10-CM | POA: Diagnosis not present

## 2022-04-05 DIAGNOSIS — H3411 Central retinal artery occlusion, right eye: Secondary | ICD-10-CM | POA: Diagnosis not present

## 2022-04-05 DIAGNOSIS — J849 Interstitial pulmonary disease, unspecified: Secondary | ICD-10-CM | POA: Diagnosis not present

## 2022-04-05 DIAGNOSIS — J310 Chronic rhinitis: Secondary | ICD-10-CM | POA: Diagnosis not present

## 2022-04-05 DIAGNOSIS — E785 Hyperlipidemia, unspecified: Secondary | ICD-10-CM | POA: Diagnosis not present

## 2022-04-05 DIAGNOSIS — N1831 Chronic kidney disease, stage 3a: Secondary | ICD-10-CM | POA: Diagnosis not present

## 2022-04-06 ENCOUNTER — Telehealth: Payer: Self-pay | Admitting: *Deleted

## 2022-04-06 NOTE — Progress Notes (Signed)
  Care Coordination Note  04/06/2022 Name: [name] MRN: [id number] DOB: 1931-05-20  [name] is a 89 y.o. year old female who is a primary care patient of Perini, Elta Guadeloupe, MD and is actively engaged with the care management team. I reached out to [name] by phone today to assist with re-scheduling a follow up visit with the RN Case Manager  Follow up plan: Telephone appointment with care management team member scheduled for:04/20/22  [place]  Direct Dial: [phone]

## 2022-04-06 NOTE — Progress Notes (Signed)
  Care Coordination Note  04/06/2022 Name: [name] MRN: [id number] DOB: 02-28-1931  [name] is a 89 y.o. year old female who is a primary care patient of Perini, Elta Guadeloupe, MD and is actively engaged with the care management team. I reached out to [name] by phone today to assist with re-scheduling a follow up visit with the RN Case Manager  Follow up plan: Unsuccessful telephone outreach attempt made. A HIPAA compliant phone message was left for the patient providing contact information and requesting a return call.   [place]  Direct Dial: [phone]

## 2022-04-07 DIAGNOSIS — M159 Polyosteoarthritis, unspecified: Secondary | ICD-10-CM | POA: Diagnosis not present

## 2022-04-07 DIAGNOSIS — I480 Paroxysmal atrial fibrillation: Secondary | ICD-10-CM | POA: Diagnosis not present

## 2022-04-07 DIAGNOSIS — M6281 Muscle weakness (generalized): Secondary | ICD-10-CM | POA: Diagnosis not present

## 2022-04-07 DIAGNOSIS — M5416 Radiculopathy, lumbar region: Secondary | ICD-10-CM | POA: Diagnosis not present

## 2022-04-07 DIAGNOSIS — M48061 Spinal stenosis, lumbar region without neurogenic claudication: Secondary | ICD-10-CM | POA: Diagnosis not present

## 2022-04-07 DIAGNOSIS — R2681 Unsteadiness on feet: Secondary | ICD-10-CM | POA: Diagnosis not present

## 2022-04-08 DIAGNOSIS — I129 Hypertensive chronic kidney disease with stage 1 through stage 4 chronic kidney disease, or unspecified chronic kidney disease: Secondary | ICD-10-CM | POA: Diagnosis not present

## 2022-04-08 DIAGNOSIS — I480 Paroxysmal atrial fibrillation: Secondary | ICD-10-CM | POA: Diagnosis not present

## 2022-04-08 DIAGNOSIS — I69351 Hemiplegia and hemiparesis following cerebral infarction affecting right dominant side: Secondary | ICD-10-CM | POA: Diagnosis not present

## 2022-04-08 DIAGNOSIS — I700 Atherosclerosis of aorta: Secondary | ICD-10-CM | POA: Diagnosis not present

## 2022-04-08 DIAGNOSIS — N1831 Chronic kidney disease, stage 3a: Secondary | ICD-10-CM | POA: Diagnosis not present

## 2022-04-08 DIAGNOSIS — M5416 Radiculopathy, lumbar region: Secondary | ICD-10-CM | POA: Diagnosis not present

## 2022-04-12 DIAGNOSIS — I69351 Hemiplegia and hemiparesis following cerebral infarction affecting right dominant side: Secondary | ICD-10-CM | POA: Diagnosis not present

## 2022-04-12 DIAGNOSIS — N1831 Chronic kidney disease, stage 3a: Secondary | ICD-10-CM | POA: Diagnosis not present

## 2022-04-12 DIAGNOSIS — M5416 Radiculopathy, lumbar region: Secondary | ICD-10-CM | POA: Diagnosis not present

## 2022-04-12 DIAGNOSIS — I700 Atherosclerosis of aorta: Secondary | ICD-10-CM | POA: Diagnosis not present

## 2022-04-12 DIAGNOSIS — I480 Paroxysmal atrial fibrillation: Secondary | ICD-10-CM | POA: Diagnosis not present

## 2022-04-12 DIAGNOSIS — I129 Hypertensive chronic kidney disease with stage 1 through stage 4 chronic kidney disease, or unspecified chronic kidney disease: Secondary | ICD-10-CM | POA: Diagnosis not present

## 2022-04-14 DIAGNOSIS — M5416 Radiculopathy, lumbar region: Secondary | ICD-10-CM | POA: Diagnosis not present

## 2022-04-14 DIAGNOSIS — N1831 Chronic kidney disease, stage 3a: Secondary | ICD-10-CM | POA: Diagnosis not present

## 2022-04-14 DIAGNOSIS — I69351 Hemiplegia and hemiparesis following cerebral infarction affecting right dominant side: Secondary | ICD-10-CM | POA: Diagnosis not present

## 2022-04-14 DIAGNOSIS — I129 Hypertensive chronic kidney disease with stage 1 through stage 4 chronic kidney disease, or unspecified chronic kidney disease: Secondary | ICD-10-CM | POA: Diagnosis not present

## 2022-04-14 DIAGNOSIS — I700 Atherosclerosis of aorta: Secondary | ICD-10-CM | POA: Diagnosis not present

## 2022-04-14 DIAGNOSIS — I480 Paroxysmal atrial fibrillation: Secondary | ICD-10-CM | POA: Diagnosis not present

## 2022-04-15 ENCOUNTER — Other Ambulatory Visit: Payer: Self-pay | Admitting: Gastroenterology

## 2022-04-15 DIAGNOSIS — R1310 Dysphagia, unspecified: Secondary | ICD-10-CM

## 2022-04-15 DIAGNOSIS — R059 Cough, unspecified: Secondary | ICD-10-CM | POA: Diagnosis not present

## 2022-04-15 DIAGNOSIS — K219 Gastro-esophageal reflux disease without esophagitis: Secondary | ICD-10-CM | POA: Diagnosis not present

## 2022-04-20 ENCOUNTER — Ambulatory Visit: Payer: Self-pay

## 2022-04-21 DIAGNOSIS — I700 Atherosclerosis of aorta: Secondary | ICD-10-CM | POA: Diagnosis not present

## 2022-04-21 DIAGNOSIS — I129 Hypertensive chronic kidney disease with stage 1 through stage 4 chronic kidney disease, or unspecified chronic kidney disease: Secondary | ICD-10-CM | POA: Diagnosis not present

## 2022-04-21 DIAGNOSIS — I69351 Hemiplegia and hemiparesis following cerebral infarction affecting right dominant side: Secondary | ICD-10-CM | POA: Diagnosis not present

## 2022-04-21 DIAGNOSIS — N1831 Chronic kidney disease, stage 3a: Secondary | ICD-10-CM | POA: Diagnosis not present

## 2022-04-21 DIAGNOSIS — I480 Paroxysmal atrial fibrillation: Secondary | ICD-10-CM | POA: Diagnosis not present

## 2022-04-21 DIAGNOSIS — M5416 Radiculopathy, lumbar region: Secondary | ICD-10-CM | POA: Diagnosis not present

## 2022-04-21 NOTE — Patient Outreach (Signed)
  Care Coordination   Follow Up Visit Note   04/20/2022 Name: [name] MRN: [id number] DOB: 1931/05/16  [name] is a 89 y.o. year old female who sees Crist Infante, MD for primary care. I spoke with  [name] by phone today.  What matters to the patients health and wellness today?  Patient will balance her activity with rest. She will keep all scheduled doctors appointments as directed.     Goals Addressed               This Visit's Progress     Patient Stated     PCP has referred mom to several specialists (pt-stated)        Care Coordination Interventions: Completed successful outbound call with daughter [name]  Determined patient completed all scheduled appointments as scheduled Discussed with daughter [name] patient recently returned home from [place] after visiting family for about a month  Determined patient continues to have some fatigue but overall she is feeling better and overall Discussed and reviewed with daughter, patient's upcoming scheduled appointment for a Barium esophagogram scheduled for 04/28/22 @1 :00 PM Instructed daughter to notify patient's doctor promptly of new or worsening symptoms promptly     Interventions Today    Flowsheet Row Most Recent Value  Chronic Disease   Chronic disease during today's visit Other  [Long COVID]  General Interventions   General Interventions Discussed/Reviewed General Interventions Discussed, General Interventions Reviewed, Doctor Visits  Doctor Visits Discussed/Reviewed Doctor Visits Discussed, Doctor Visits Reviewed, PCP, Specialist  Education Interventions   Education Provided Provided Education  Provided Verbal Education On Nutrition, Medication, When to see the doctor  Nutrition Interventions   Nutrition Discussed/Reviewed Nutrition Discussed, Nutrition Reviewed  Safety Interventions   Safety Discussed/Reviewed Safety Discussed          SDOH assessments and interventions completed:   No     Care Coordination Interventions:  Yes, provided   Follow up plan: Follow up call scheduled for 06/22/22 @09 :00 AM    Encounter Outcome:  Pt. Visit Completed

## 2022-04-21 NOTE — Patient Instructions (Signed)
Visit Information  Thank you for taking time to visit with me today. Please don't hesitate to contact me if I can be of assistance to you.   Following are the goals we discussed today:   Goals Addressed               This Visit's Progress     Patient Stated     PCP has referred mom to several specialists (pt-stated)        Care Coordination Interventions: Completed successful outbound call with daughter Gordy Savers  Determined patient completed all scheduled appointments as scheduled Discussed with daughter [name] patient recently returned home from [place] after visiting family for about a month  Determined patient continues to have some fatigue but overall she is feeling better and overall Discussed and reviewed with daughter, patient's upcoming scheduled appointment for a Barium esophagogram scheduled for 04/28/22 @1 :00 PM Instructed daughter to notify patient's doctor promptly of new or worsening symptoms promptly         Our next appointment is by telephone on 06/22/22 at 09:00 AM  Please call the care guide team at [phone] if you need to cancel or reschedule your appointment.   If you are experiencing a Mental Health or [place] or need someone to talk to, please call 1-800-273-TALK (toll free, 24 hour hotline) go to [facility] [address], [place] [phone])  Patient verbalizes understanding of instructions and care plan provided today and agrees to view in [place]. Active MyChart status and patient understanding of how to access instructions and care plan via MyChart confirmed with patient.     Barb Merino, RN, BSN, CCM Care Management Coordinator [facility] Care Management  Direct Phone: [phone]

## 2022-04-23 DIAGNOSIS — N1831 Chronic kidney disease, stage 3a: Secondary | ICD-10-CM | POA: Diagnosis not present

## 2022-04-23 DIAGNOSIS — I129 Hypertensive chronic kidney disease with stage 1 through stage 4 chronic kidney disease, or unspecified chronic kidney disease: Secondary | ICD-10-CM | POA: Diagnosis not present

## 2022-04-23 DIAGNOSIS — M5416 Radiculopathy, lumbar region: Secondary | ICD-10-CM | POA: Diagnosis not present

## 2022-04-23 DIAGNOSIS — I480 Paroxysmal atrial fibrillation: Secondary | ICD-10-CM | POA: Diagnosis not present

## 2022-04-23 DIAGNOSIS — I700 Atherosclerosis of aorta: Secondary | ICD-10-CM | POA: Diagnosis not present

## 2022-04-23 DIAGNOSIS — I69351 Hemiplegia and hemiparesis following cerebral infarction affecting right dominant side: Secondary | ICD-10-CM | POA: Diagnosis not present

## 2022-04-26 DIAGNOSIS — N1831 Chronic kidney disease, stage 3a: Secondary | ICD-10-CM | POA: Diagnosis not present

## 2022-04-26 DIAGNOSIS — M5416 Radiculopathy, lumbar region: Secondary | ICD-10-CM | POA: Diagnosis not present

## 2022-04-26 DIAGNOSIS — I69351 Hemiplegia and hemiparesis following cerebral infarction affecting right dominant side: Secondary | ICD-10-CM | POA: Diagnosis not present

## 2022-04-26 DIAGNOSIS — I129 Hypertensive chronic kidney disease with stage 1 through stage 4 chronic kidney disease, or unspecified chronic kidney disease: Secondary | ICD-10-CM | POA: Diagnosis not present

## 2022-04-26 DIAGNOSIS — I480 Paroxysmal atrial fibrillation: Secondary | ICD-10-CM | POA: Diagnosis not present

## 2022-04-26 DIAGNOSIS — I700 Atherosclerosis of aorta: Secondary | ICD-10-CM | POA: Diagnosis not present

## 2022-04-28 ENCOUNTER — Other Ambulatory Visit: Payer: Medicare Other

## 2022-04-28 DIAGNOSIS — I700 Atherosclerosis of aorta: Secondary | ICD-10-CM | POA: Diagnosis not present

## 2022-04-28 DIAGNOSIS — I129 Hypertensive chronic kidney disease with stage 1 through stage 4 chronic kidney disease, or unspecified chronic kidney disease: Secondary | ICD-10-CM | POA: Diagnosis not present

## 2022-04-28 DIAGNOSIS — I480 Paroxysmal atrial fibrillation: Secondary | ICD-10-CM | POA: Diagnosis not present

## 2022-04-28 DIAGNOSIS — M5416 Radiculopathy, lumbar region: Secondary | ICD-10-CM | POA: Diagnosis not present

## 2022-04-28 DIAGNOSIS — N1831 Chronic kidney disease, stage 3a: Secondary | ICD-10-CM | POA: Diagnosis not present

## 2022-04-28 DIAGNOSIS — I69351 Hemiplegia and hemiparesis following cerebral infarction affecting right dominant side: Secondary | ICD-10-CM | POA: Diagnosis not present

## 2022-04-29 ENCOUNTER — Ambulatory Visit
Admission: RE | Admit: 2022-04-29 | Discharge: 2022-04-29 | Disposition: A | Discharge status: Home / Self Care | Payer: Medicare Other | Source: Ambulatory Visit | Attending: Gastroenterology | Admitting: Gastroenterology

## 2022-04-29 DIAGNOSIS — K449 Diaphragmatic hernia without obstruction or gangrene: Secondary | ICD-10-CM | POA: Diagnosis not present

## 2022-04-29 DIAGNOSIS — K224 Dyskinesia of esophagus: Secondary | ICD-10-CM | POA: Diagnosis not present

## 2022-04-29 DIAGNOSIS — R1310 Dysphagia, unspecified: Secondary | ICD-10-CM

## 2022-05-03 DIAGNOSIS — M48061 Spinal stenosis, lumbar region without neurogenic claudication: Secondary | ICD-10-CM | POA: Diagnosis not present

## 2022-05-03 DIAGNOSIS — D6869 Other thrombophilia: Secondary | ICD-10-CM | POA: Diagnosis not present

## 2022-05-03 DIAGNOSIS — D692 Other nonthrombocytopenic purpura: Secondary | ICD-10-CM | POA: Diagnosis not present

## 2022-05-03 DIAGNOSIS — M159 Polyosteoarthritis, unspecified: Secondary | ICD-10-CM | POA: Diagnosis not present

## 2022-05-03 DIAGNOSIS — Z8673 Personal history of transient ischemic attack (TIA), and cerebral infarction without residual deficits: Secondary | ICD-10-CM | POA: Diagnosis not present

## 2022-05-03 DIAGNOSIS — R2681 Unsteadiness on feet: Secondary | ICD-10-CM | POA: Diagnosis not present

## 2022-05-03 DIAGNOSIS — N1831 Chronic kidney disease, stage 3a: Secondary | ICD-10-CM | POA: Diagnosis not present

## 2022-05-03 DIAGNOSIS — E2740 Unspecified adrenocortical insufficiency: Secondary | ICD-10-CM | POA: Diagnosis not present

## 2022-05-03 DIAGNOSIS — N390 Urinary tract infection, site not specified: Secondary | ICD-10-CM | POA: Diagnosis not present

## 2022-05-03 DIAGNOSIS — I700 Atherosclerosis of aorta: Secondary | ICD-10-CM | POA: Diagnosis not present

## 2022-05-03 DIAGNOSIS — R7301 Impaired fasting glucose: Secondary | ICD-10-CM | POA: Diagnosis not present

## 2022-05-03 DIAGNOSIS — J849 Interstitial pulmonary disease, unspecified: Secondary | ICD-10-CM | POA: Diagnosis not present

## 2022-05-04 DIAGNOSIS — R82998 Other abnormal findings in urine: Secondary | ICD-10-CM | POA: Diagnosis not present

## 2022-05-04 DIAGNOSIS — I480 Paroxysmal atrial fibrillation: Secondary | ICD-10-CM | POA: Diagnosis not present

## 2022-05-04 DIAGNOSIS — I129 Hypertensive chronic kidney disease with stage 1 through stage 4 chronic kidney disease, or unspecified chronic kidney disease: Secondary | ICD-10-CM | POA: Diagnosis not present

## 2022-05-04 DIAGNOSIS — N1831 Chronic kidney disease, stage 3a: Secondary | ICD-10-CM | POA: Diagnosis not present

## 2022-05-04 DIAGNOSIS — M5416 Radiculopathy, lumbar region: Secondary | ICD-10-CM | POA: Diagnosis not present

## 2022-05-04 DIAGNOSIS — I69351 Hemiplegia and hemiparesis following cerebral infarction affecting right dominant side: Secondary | ICD-10-CM | POA: Diagnosis not present

## 2022-05-04 DIAGNOSIS — I700 Atherosclerosis of aorta: Secondary | ICD-10-CM | POA: Diagnosis not present

## 2022-05-05 ENCOUNTER — Ambulatory Visit (INDEPENDENT_AMBULATORY_CARE_PROVIDER_SITE_OTHER): Payer: Medicare Other | Admitting: Pulmonary Disease

## 2022-05-05 ENCOUNTER — Encounter (HOSPITAL_BASED_OUTPATIENT_CLINIC_OR_DEPARTMENT_OTHER): Payer: Self-pay | Admitting: Pulmonary Disease

## 2022-05-05 VITALS — BP 124/68 | HR 84 | Temp 97.7°F | Ht 60.0 in | Wt 155.0 lb

## 2022-05-05 DIAGNOSIS — J432 Centrilobular emphysema: Secondary | ICD-10-CM | POA: Diagnosis not present

## 2022-05-05 DIAGNOSIS — H3411 Central retinal artery occlusion, right eye: Secondary | ICD-10-CM | POA: Diagnosis not present

## 2022-05-05 DIAGNOSIS — I480 Paroxysmal atrial fibrillation: Secondary | ICD-10-CM | POA: Diagnosis not present

## 2022-05-05 DIAGNOSIS — D6869 Other thrombophilia: Secondary | ICD-10-CM | POA: Diagnosis not present

## 2022-05-05 DIAGNOSIS — J849 Interstitial pulmonary disease, unspecified: Secondary | ICD-10-CM | POA: Diagnosis not present

## 2022-05-05 DIAGNOSIS — I69320 Aphasia following cerebral infarction: Secondary | ICD-10-CM | POA: Diagnosis not present

## 2022-05-05 DIAGNOSIS — M1990 Unspecified osteoarthritis, unspecified site: Secondary | ICD-10-CM | POA: Diagnosis not present

## 2022-05-05 DIAGNOSIS — G4733 Obstructive sleep apnea (adult) (pediatric): Secondary | ICD-10-CM | POA: Diagnosis not present

## 2022-05-05 DIAGNOSIS — R1310 Dysphagia, unspecified: Secondary | ICD-10-CM | POA: Diagnosis not present

## 2022-05-05 DIAGNOSIS — G319 Degenerative disease of nervous system, unspecified: Secondary | ICD-10-CM | POA: Diagnosis not present

## 2022-05-05 DIAGNOSIS — M5416 Radiculopathy, lumbar region: Secondary | ICD-10-CM | POA: Diagnosis not present

## 2022-05-05 DIAGNOSIS — M5430 Sciatica, unspecified side: Secondary | ICD-10-CM | POA: Diagnosis not present

## 2022-05-05 DIAGNOSIS — J310 Chronic rhinitis: Secondary | ICD-10-CM | POA: Diagnosis not present

## 2022-05-05 DIAGNOSIS — I129 Hypertensive chronic kidney disease with stage 1 through stage 4 chronic kidney disease, or unspecified chronic kidney disease: Secondary | ICD-10-CM | POA: Diagnosis not present

## 2022-05-05 DIAGNOSIS — E871 Hypo-osmolality and hyponatremia: Secondary | ICD-10-CM | POA: Diagnosis not present

## 2022-05-05 DIAGNOSIS — N1831 Chronic kidney disease, stage 3a: Secondary | ICD-10-CM | POA: Diagnosis not present

## 2022-05-05 DIAGNOSIS — Z7901 Long term (current) use of anticoagulants: Secondary | ICD-10-CM | POA: Diagnosis not present

## 2022-05-05 DIAGNOSIS — D519 Vitamin B12 deficiency anemia, unspecified: Secondary | ICD-10-CM | POA: Diagnosis not present

## 2022-05-05 DIAGNOSIS — E876 Hypokalemia: Secondary | ICD-10-CM | POA: Diagnosis not present

## 2022-05-05 DIAGNOSIS — I700 Atherosclerosis of aorta: Secondary | ICD-10-CM | POA: Diagnosis not present

## 2022-05-05 DIAGNOSIS — E2740 Unspecified adrenocortical insufficiency: Secondary | ICD-10-CM | POA: Diagnosis not present

## 2022-05-05 DIAGNOSIS — D692 Other nonthrombocytopenic purpura: Secondary | ICD-10-CM | POA: Diagnosis not present

## 2022-05-05 DIAGNOSIS — Z7952 Long term (current) use of systemic steroids: Secondary | ICD-10-CM | POA: Diagnosis not present

## 2022-05-05 DIAGNOSIS — E785 Hyperlipidemia, unspecified: Secondary | ICD-10-CM | POA: Diagnosis not present

## 2022-05-05 DIAGNOSIS — D7589 Other specified diseases of blood and blood-forming organs: Secondary | ICD-10-CM | POA: Diagnosis not present

## 2022-05-05 DIAGNOSIS — I69351 Hemiplegia and hemiparesis following cerebral infarction affecting right dominant side: Secondary | ICD-10-CM | POA: Diagnosis not present

## 2022-05-05 NOTE — Assessment & Plan Note (Signed)
Unable to obtain PFTs.  MDI technique is poor so we will change her to DuoNebs twice daily and as needed. Will DC Symbicort

## 2022-05-05 NOTE — Assessment & Plan Note (Signed)
Unclear cause, have asked her to stop Macrodantin numerous times but this remains on her medication list.  Advised daughter again and will CC to PCP, she is allergic to sulfa so may be difficult to find alternative for UTI. We will obtain high-resolution CT chest If significant fibrosis then attempt to obtain PFTs

## 2022-05-05 NOTE — Patient Instructions (Signed)
x check ambulatory saturation  x high-resolution CT chest to follow-up on scarring in the lungs  X discussed with neurologist/Dr. Waynard Edwards if we can substitute another antibiotic for Macrodantin since this can cause scarring in the lungs  X please use DuoNebs twice daily, okay to stop taking Combivent

## 2022-05-05 NOTE — Progress Notes (Signed)
   Subjective:    Patient ID: [name], female    DOB: 01/24/1932, 89 y.o.   MRN: [id number]  HPI  89 yo remote smoker for follow-up of COPD & ILD ILD could be post COVID or related to Macrodantin.  OSA is being managed by neurology  hospitalization for pneumonia in December 2021, requiring oxygen   PMHx COPD (emphysema), A.Fib on Eliquis, recurrent UTIs on macrodantin 10/2019 R thalamic, cerebellar, and frontal small infarcts with right PCA occlusion s/p tPA and IR with TICI3 from AF not on AC   Initial OV 01/29/20 >> dc macrodantin, no sig desatn>> stay off O2   Last Ov 01/2022 On ambulation her oxygen saturation dropped from 95 to 87% after walking 1 lap and recovered with resting  -Rx duonebs   Accompanied by daughter [name] today who corroborates history. She is back on Macrodantin.  Breathing is improved, denies cough or wheezing. She is taking DuoNebs at night and Combivent in the daytime, Combivent does not seem to help. She is able to use CPAP, but is sporadic with this- on some nights can use up to 8 hours  On ambulation today oxygen saturation stayed at 95%  Significant tests/ events reviewed    Esophagram 04/2022 >>Transient laryngeal penetration , Moderate esophageal dysmotility    CT chest/A/P 10/2021 >> Increased interstitial markings are seen in the periphery of both lungs, more so in left upper lobe.  CT chest 01/21/2020 right upper lobe consolidation superimposed on emphysema small bilateral effusions    07/2020 HST -pAHI 37/hour, low saturation 82%  Review of Systems neg for any significant sore throat, dysphagia, itching, sneezing, nasal congestion or excess/ purulent secretions, fever, chills, sweats, unintended wt loss, pleuritic or exertional cp, hempoptysis, orthopnea pnd or change in chronic leg swelling. Also denies presyncope, palpitations, heartburn, abdominal pain, nausea, vomiting, diarrhea or change in bowel or urinary habits,  dysuria,hematuria, rash, arthralgias, visual complaints, headache, numbness weakness or ataxia.     Objective:   Physical Exam  Gen. Pleasant, obese, in no distress ENT - no lesions, no post nasal drip Neck: No JVD, no thyromegaly, no carotid bruits Lungs: no use of accessory muscles, no dullness to percussion, decreased without rales or rhonchi  Cardiovascular: Rhythm regular, heart sounds  normal, no murmurs or gallops, no peripheral edema Musculoskeletal: No deformities, no cyanosis or clubbing , no tremors       Assessment & Plan:

## 2022-05-06 ENCOUNTER — Ambulatory Visit: Payer: Medicare Other | Admitting: Adult Health

## 2022-05-07 ENCOUNTER — Encounter: Payer: Self-pay | Admitting: Adult Health

## 2022-05-11 DIAGNOSIS — I480 Paroxysmal atrial fibrillation: Secondary | ICD-10-CM | POA: Diagnosis not present

## 2022-05-11 DIAGNOSIS — I69351 Hemiplegia and hemiparesis following cerebral infarction affecting right dominant side: Secondary | ICD-10-CM | POA: Diagnosis not present

## 2022-05-11 DIAGNOSIS — N1831 Chronic kidney disease, stage 3a: Secondary | ICD-10-CM | POA: Diagnosis not present

## 2022-05-11 DIAGNOSIS — I700 Atherosclerosis of aorta: Secondary | ICD-10-CM | POA: Diagnosis not present

## 2022-05-11 DIAGNOSIS — I129 Hypertensive chronic kidney disease with stage 1 through stage 4 chronic kidney disease, or unspecified chronic kidney disease: Secondary | ICD-10-CM | POA: Diagnosis not present

## 2022-05-11 DIAGNOSIS — M5416 Radiculopathy, lumbar region: Secondary | ICD-10-CM | POA: Diagnosis not present

## 2022-05-17 DIAGNOSIS — I480 Paroxysmal atrial fibrillation: Secondary | ICD-10-CM | POA: Diagnosis not present

## 2022-05-17 DIAGNOSIS — M5416 Radiculopathy, lumbar region: Secondary | ICD-10-CM | POA: Diagnosis not present

## 2022-05-17 DIAGNOSIS — I700 Atherosclerosis of aorta: Secondary | ICD-10-CM | POA: Diagnosis not present

## 2022-05-17 DIAGNOSIS — I129 Hypertensive chronic kidney disease with stage 1 through stage 4 chronic kidney disease, or unspecified chronic kidney disease: Secondary | ICD-10-CM | POA: Diagnosis not present

## 2022-05-17 DIAGNOSIS — N1831 Chronic kidney disease, stage 3a: Secondary | ICD-10-CM | POA: Diagnosis not present

## 2022-05-17 DIAGNOSIS — I69351 Hemiplegia and hemiparesis following cerebral infarction affecting right dominant side: Secondary | ICD-10-CM | POA: Diagnosis not present

## 2022-05-26 ENCOUNTER — Ambulatory Visit: Payer: Medicare Other | Admitting: Neurology

## 2022-05-27 ENCOUNTER — Ambulatory Visit: Payer: Medicare Other | Admitting: Adult Health

## 2022-05-31 DIAGNOSIS — I480 Paroxysmal atrial fibrillation: Secondary | ICD-10-CM | POA: Diagnosis not present

## 2022-05-31 DIAGNOSIS — N1831 Chronic kidney disease, stage 3a: Secondary | ICD-10-CM | POA: Diagnosis not present

## 2022-05-31 DIAGNOSIS — M5416 Radiculopathy, lumbar region: Secondary | ICD-10-CM | POA: Diagnosis not present

## 2022-05-31 DIAGNOSIS — I69351 Hemiplegia and hemiparesis following cerebral infarction affecting right dominant side: Secondary | ICD-10-CM | POA: Diagnosis not present

## 2022-05-31 DIAGNOSIS — I129 Hypertensive chronic kidney disease with stage 1 through stage 4 chronic kidney disease, or unspecified chronic kidney disease: Secondary | ICD-10-CM | POA: Diagnosis not present

## 2022-05-31 DIAGNOSIS — I700 Atherosclerosis of aorta: Secondary | ICD-10-CM | POA: Diagnosis not present

## 2022-06-01 ENCOUNTER — Ambulatory Visit (HOSPITAL_COMMUNITY): Payer: Medicare Other

## 2022-06-03 DIAGNOSIS — D3131 Benign neoplasm of right choroid: Secondary | ICD-10-CM | POA: Diagnosis not present

## 2022-06-03 DIAGNOSIS — H35371 Puckering of macula, right eye: Secondary | ICD-10-CM | POA: Diagnosis not present

## 2022-06-03 DIAGNOSIS — H02834 Dermatochalasis of left upper eyelid: Secondary | ICD-10-CM | POA: Diagnosis not present

## 2022-06-03 DIAGNOSIS — H02831 Dermatochalasis of right upper eyelid: Secondary | ICD-10-CM | POA: Diagnosis not present

## 2022-06-15 DIAGNOSIS — R1310 Dysphagia, unspecified: Secondary | ICD-10-CM | POA: Diagnosis not present

## 2022-06-15 DIAGNOSIS — K219 Gastro-esophageal reflux disease without esophagitis: Secondary | ICD-10-CM | POA: Diagnosis not present

## 2022-06-22 ENCOUNTER — Ambulatory Visit: Payer: Self-pay

## 2022-06-22 NOTE — Patient Outreach (Signed)
  Care Coordination   Follow Up Visit Note   06/22/2022 Name: [name] MRN: [id number] DOB: 08/04/1931  [name] is a 89 y.o. year old female who sees Rodrigo Ran, MD for primary care. I spoke with daughter [name] by phone today.  What matters to the patients health and wellness today?  Daughter [name] would like to clarify with patient's doctor, if taking Macrobid is contraindicated for patient's COPD/ILD.     Goals Addressed               This Visit's Progress     Patient Stated     COMPLETED: PCP has referred mom to several specialists (pt-stated)        Care Coordination Interventions: Completed successful outbound call with daughter [name]  Determined daughter [name] continues to assist her mother to her scheduled appointments as directed and voices having no issues with doing so at this time Reviewed upcoming scheduled appointments with daughter       Other     To clarify use of prescribed antibiotic for reoccurring UTI        Care Coordination Interventions: Completed successful outbound call with daughter [name]  Evaluation of current treatment plan related to chronic UTI and patient's adherence to plan as established by provider Reviewed and discussed recent Rx sent for treatment of potential UTI (Macrobid) Discussed and reviewed recent recommendation by Pulmonologist, Dr. Vassie Loll, to discontinue use of Macrodantin due to potential lung scarring Discussed with daughter [name] she will speak with Dr. Vassie Loll to clarify Macrobid is okay to use for UTI       To completed lung CT and use administer breathing treatments as directed        Care Coordination Interventions: Completed successful outbound call with daughter [name] Provided patient with basic written and verbal COPD education on self care/management/and exacerbation prevention Provided instruction about proper use of medications used for management of COPD including  inhalers Educated daughter [name] about use of a spacer to ensure patient is getting the full dose of her inhaler as directed Determined daughter [name] will ask Dr. Vassie Loll for an RX    Interventions Today    Flowsheet Row Most Recent Value  Chronic Disease   Chronic disease during today's visit Other  [chronic UTI]  General Interventions   General Interventions Discussed/Reviewed General Interventions Discussed, General Interventions Reviewed, Doctor Visits  Doctor Visits Discussed/Reviewed Doctor Visits Discussed, Doctor Visits Reviewed, Specialist, PCP  Education Interventions   Education Provided Provided Education  Provided Verbal Education On Medication, When to see the doctor, Other  [inhaler]  Pharmacy Interventions   Pharmacy Dicussed/Reviewed Pharmacy Topics Discussed, Medications and their functions, Pharmacy Topics Reviewed          SDOH assessments and interventions completed:  No     Care Coordination Interventions:  Yes, provided   Follow up plan: Follow up call scheduled for 08/30/22 @09 :00 AM    Encounter Outcome:  Pt. Visit Completed

## 2022-06-22 NOTE — Patient Instructions (Signed)
Visit Information  Thank you for taking time to visit with me today. Please don't hesitate to contact me if I can be of assistance to you.   Following are the goals we discussed today:   Goals Addressed               This Visit's Progress     Patient Stated     COMPLETED: PCP has referred mom to several specialists (pt-stated)        Care Coordination Interventions: Completed successful outbound call with daughter [name]  Determined daughter [name] continues to assist her mother to her scheduled appointments as directed and voices having no issues with doing so at this time Reviewed upcoming scheduled appointments with daughter       Other     To clarify use of prescribed antibiotic for reoccurring UTI        Care Coordination Interventions: Completed successful outbound call with daughter [name]  Evaluation of current treatment plan related to chronic UTI and patient's adherence to plan as established by provider Reviewed and discussed recent Rx sent for treatment of potential UTI (Macrobid) Discussed and reviewed recent recommendation by Pulmonologist, Dr. Vassie Loll, to discontinue use of Macrodantin due to potential lung scarring Discussed with daughter [name] she will speak with Dr. Vassie Loll to clarify Macrobid is okay to use for UTI          To completed lung CT and use administer breathing treatments as directed        Care Coordination Interventions: Completed successful outbound call with daughter [name] Provided patient with basic written and verbal COPD education on self care/management/and exacerbation prevention Provided instruction about proper use of medications used for management of COPD including inhalers Educated daughter [name] about use of a spacer to ensure patient is getting the full dose of her inhaler as directed Determined daughter [name] will ask Dr. Vassie Loll for an RX          Our next appointment is by telephone on 08/30/22 at  09:00 AM  Please call the care guide team at [phone] if you need to cancel or reschedule your appointment.   If you are experiencing a Mental Health or Behavioral Health Crisis or need someone to talk to, please call 1-800-273-TALK (toll free, 24 hour hotline) go to [facility] [address] [place] [phone])  Patient verbalizes understanding of instructions and care plan provided today and agrees to view in MyChart. Active MyChart status and patient understanding of how to access instructions and care plan via MyChart confirmed with patient.     Delsa Sale, RN, BSN, CCM Care Management Coordinator [facility] Care Management  Direct Phone: [phone]

## 2022-06-23 ENCOUNTER — Ambulatory Visit (HOSPITAL_COMMUNITY)
Admission: RE | Admit: 2022-06-23 | Discharge: 2022-06-23 | Disposition: A | Discharge status: Home / Self Care | Payer: Medicare Other | Source: Ambulatory Visit | Attending: Pulmonary Disease | Admitting: Pulmonary Disease

## 2022-06-23 DIAGNOSIS — J849 Interstitial pulmonary disease, unspecified: Secondary | ICD-10-CM | POA: Insufficient documentation

## 2022-06-23 DIAGNOSIS — J479 Bronchiectasis, uncomplicated: Secondary | ICD-10-CM | POA: Diagnosis not present

## 2022-06-23 DIAGNOSIS — J432 Centrilobular emphysema: Secondary | ICD-10-CM | POA: Diagnosis not present

## 2022-06-28 ENCOUNTER — Encounter: Payer: Self-pay | Admitting: Cardiology

## 2022-06-28 ENCOUNTER — Ambulatory Visit: Payer: Medicare Other | Attending: Cardiology | Admitting: Cardiology

## 2022-06-28 VITALS — BP 118/66 | HR 71 | Ht 60.0 in | Wt 154.0 lb

## 2022-06-28 DIAGNOSIS — I483 Typical atrial flutter: Secondary | ICD-10-CM | POA: Diagnosis not present

## 2022-06-28 DIAGNOSIS — I495 Sick sinus syndrome: Secondary | ICD-10-CM | POA: Diagnosis not present

## 2022-06-28 NOTE — Progress Notes (Signed)
Cardiology Office Note:    Date:  06/28/2022   ID:  [name], DOB Jun 11, 1931, MRN [id number]  PCP:  Rodrigo Ran, MD   [facility] Providers Cardiologist:  Donato Schultz, MD     Referring MD: Rodrigo Ran, MD    History of Present Illness:    [name] is a 89 y.o. female here for the follow-up of prior atrial flutter ablation 2009 with history of tachycardia-bradycardia syndrome stroke OSA CPAP and HLD.  Prior ZIO monitor showed slow atrial fibrillation 100%.  Average heart rate 56.  Overall doing well.  She does have some shortness of breath with activity likely from deconditioning.  She utilizes a rolling walker which is helpful for stability.  Her daughter is present to help with historical items.  No chest pain no syncope no bleeding.  She has stopped her atorvastatin.    Past Medical History:  Diagnosis Date   Atrial fibrillation [facility])    Prior treatment with amiodarone, held sinus rhythm and drug stopped   Atrial flutter (HCC)    Status post ablation prior to  2009   Chest pain    Nuclear, 2004, no scar or ischemia, EF 77%   Ejection fraction    EF 70%, nuclear,  2004  //      Fall    July, 2014   FUO (fever of unknown origin) 11/15/2021   H/O bladder repair surgery    OSA (obstructive sleep apnea)    Right Achilles tendinitis    Sciatica     Past Surgical History:  Procedure Laterality Date   IR CT HEAD LTD  11/14/2019   IR PERCUTANEOUS ART THROMBECTOMY/INFUSION INTRACRANIAL INC DIAG ANGIO  11/14/2019   IR RADIOLOGIST EVAL & MGMT  02/20/2020   IR US GUIDE VASC ACCESS RIGHT  11/14/2019   no surgical hx     RADIOLOGY WITH ANESTHESIA N/A 11/13/2019   Procedure: IR WITH ANESTHESIA;  Surgeon: Julieanne Cotton, MD;  Location: [facility] OR;  Service: Radiology;  Laterality: N/A;    Current Medications: Current Meds  Medication Sig   acetaminophen (TYLENOL) 325 MG tablet Take 2 tablets (650 mg total) by mouth every 6 (six) hours as needed for mild pain,  fever or headache.   Cholecalciferol (VITAMIN D3) 50 MCG (2000 UT) CAPS Take 2,000 Units by mouth at bedtime.   ciprofloxacin-dexamethasone (CIPRODEX) OTIC suspension Place 4 drops into the left ear.   ELIQUIS 5 MG TABS tablet TAKE 1 TABLET TWICE A DAY   famotidine (PEPCID) 40 MG tablet Take 40 mg by mouth daily.   GEMTESA 75 MG TABS Take 75 mg by mouth daily.   Ipratropium-Albuterol (COMBIVENT) 20-100 MCG/ACT AERS respimat Inhale 1 puff into the lungs every 6 (six) hours as needed for wheezing or shortness of breath.   ipratropium-albuterol (DUONEB) 0.5-2.5 (3) MG/3ML SOLN Take 3 mLs by nebulization every 6 (six) hours as needed.   mirtazapine (REMERON) 7.5 MG tablet Take 1 tablet (7.5 mg total) by mouth at bedtime.   Multiple Vitamins-Minerals (SENIOR VITES PO) Take 1 tablet by mouth daily with breakfast.   nitrofurantoin, macrocrystal-monohydrate, (MACROBID) 100 MG capsule Take 100 mg by mouth 2 (two) times daily.   ondansetron (ZOFRAN) 4 MG tablet Take 1 tablet (4 mg total) by mouth every 6 (six) hours as needed for nausea.   polyethylene glycol (MIRALAX / GLYCOLAX) 17 g packet Take 17 g by mouth 2 (two) times daily as needed for mild constipation.   Probiotic Product (PHILLIPS  COLON HEALTH) CAPS Take 1 capsule by mouth daily.     Allergies:   Bee venom, Mixed vespid venom, Codeine, Sulfa antibiotics, and Erythromycin   Social History   Socioeconomic History   Marital status: Widowed    Spouse name: Not on file   Number of children: 4   Years of education: Not on file   Highest education level: Not on file  Occupational History   Not on file  Tobacco Use   Smoking status: Former    Packs/day: 1.00    Years: 10.00    Additional pack years: 0.00    Total pack years: 10.00    Types: Cigarettes   Smokeless tobacco: Never  Vaping Use   Vaping Use: Never used  Substance and Sexual Activity   Alcohol use: Yes   Drug use: Never   Sexual activity: Not on file  Other Topics  Concern   Not on file  Social History Narrative   11/24/20 husband passed, children stay with her at night   Social Determinants of Health   Financial Resource Strain: Not on file  Food Insecurity: No Food Insecurity (01/04/2022)   Hunger Vital Sign    Worried About Running Out of Food in the Last Year: Never true    Ran Out of Food in the Last Year: Never true  Transportation Needs: No Transportation Needs (01/04/2022)   PRAPARE - Administrator, Civil Service (Medical): No    Lack of Transportation (Non-Medical): No  Physical Activity: Not on file  Stress: Not on file  Social Connections: Not on file     Family History: The patient's family history includes Cancer in her father and mother; Heart disease in her brother.  ROS:   Please see the history of present illness.     All other systems reviewed and are negative.  EKGs/Labs/Other Studies Reviewed:    The following studies were reviewed today: Cardiac Studies & Procedures     STRESS TESTS  MYOCARDIAL PERFUSION IMAGING 05/06/2015   ECHOCARDIOGRAM  ECHOCARDIOGRAM COMPLETE 03/25/2021  Narrative ECHOCARDIOGRAM REPORT    Patient Name:   [name] Date of Exam: 03/25/2021 Medical Rec #:  [id number]     Height:       60.0 in Accession #:    [id number]    Weight:       152.2 lb Date of Birth:  1931/08/30     BSA:          1.662 m Patient Age:    89 years      BP:           152/62 mmHg Patient Gender: F             HR:           50 bpm. Exam Location:  [address]  Procedure: 2D Echo, 3D Echo, Cardiac Doppler and Color Doppler  Indications:    I31.3 Pericardial Effusion  History:        Patient has prior history of Echocardiogram examinations, most recent 11/14/2019. Stroke, Arrythmias:Atrial Fibrillation, Signs/Symptoms:Fatigue; Risk Factors:HLD.  Sonographer:    Clearence Ped RCS Referring Phys: 59 TESSA N CONTE  IMPRESSIONS   1. Left ventricular ejection fraction, by estimation, is 65 to  70%. Left ventricular ejection fraction by 3D volume is 67 %. The left ventricle has normal function. The left ventricle has no regional wall motion abnormalities. There is severe left ventricular hypertrophy of the basal-septal segment. Left ventricular diastolic parameters are  indeterminate. 2. Right ventricular systolic function is normal. The right ventricular size is normal. There is normal pulmonary artery systolic pressure. The estimated right ventricular systolic pressure is 33.5 mmHg. 3. Left atrial size was moderately dilated. 4. Right atrial size was mildly dilated. 5. The mitral valve is normal in structure. Trivial mitral valve regurgitation. No evidence of mitral stenosis. 6. The aortic valve is normal in structure. Aortic valve regurgitation is not visualized. No aortic stenosis is present. 7. The inferior vena cava is normal in size with greater than 50% respiratory variability, suggesting right atrial pressure of 3 mmHg.  FINDINGS Left Ventricle: Left ventricular ejection fraction, by estimation, is 65 to 70%. Left ventricular ejection fraction by 3D volume is 67 %. The left ventricle has normal function. The left ventricle has no regional wall motion abnormalities. The left ventricular internal cavity size was normal in size. There is severe left ventricular hypertrophy of the basal-septal segment. Left ventricular diastolic function could not be evaluated due to atrial fibrillation. Left ventricular diastolic parameters are indeterminate.  Right Ventricle: The right ventricular size is normal. No increase in right ventricular wall thickness. Right ventricular systolic function is normal. There is normal pulmonary artery systolic pressure. The tricuspid regurgitant velocity is 2.76 m/s, and with an assumed right atrial pressure of 3 mmHg, the estimated right ventricular systolic pressure is 33.5 mmHg.  Left Atrium: Left atrial size was moderately dilated.  Right Atrium: Right  atrial size was mildly dilated.  Pericardium: There is no evidence of pericardial effusion.  Mitral Valve: The mitral valve is normal in structure. Mild to moderate mitral annular calcification. Trivial mitral valve regurgitation. No evidence of mitral valve stenosis.  Tricuspid Valve: The tricuspid valve is normal in structure. Tricuspid valve regurgitation is mild . No evidence of tricuspid stenosis.  Aortic Valve: The aortic valve is normal in structure. Aortic valve regurgitation is not visualized. No aortic stenosis is present.  Pulmonic Valve: The pulmonic valve was normal in structure. Pulmonic valve regurgitation is trivial. No evidence of pulmonic stenosis.  Aorta: The aortic root is normal in size and structure.  Venous: The inferior vena cava is normal in size with greater than 50% respiratory variability, suggesting right atrial pressure of 3 mmHg.  IAS/Shunts: No atrial level shunt detected by color flow Doppler.   LEFT VENTRICLE PLAX 2D LVIDd:         3.40 cm         Diastology LVIDs:         2.00 cm         LV e' medial:    9.25 cm/s LV PW:         1.10 cm         LV E/e' medial:  12.6 LV IVS:        1.50 cm         LV e' lateral:   15.90 cm/s LVOT diam:     2.10 cm         LV E/e' lateral: 7.4 LV SV:         83 LV SV Index:   50 LVOT Area:     3.46 cm        3D Volume EF LV 3D EF:    Left ventricul ar ejection fraction by 3D volume is 67 %.  3D Volume EF: 3D EF:        67 % LV EDV:       74 ml LV ESV:  24 ml LV SV:        50 ml  RIGHT VENTRICLE RV Basal diam:  3.10 cm RV S prime:     10.20 cm/s TAPSE (M-mode): 1.6 cm RVSP:           33.5 mmHg  LEFT ATRIUM             Index        RIGHT ATRIUM           Index LA diam:        4.40 cm 2.65 cm/m   RA Pressure: 3.00 mmHg LA Vol (A2C):   85.4 ml 51.38 ml/m  RA Area:     18.90 cm LA Vol (A4C):   65.4 ml 39.35 ml/m  RA Volume:   47.10 ml  28.34 ml/m LA Biplane Vol: 74.9 ml 45.07 ml/m AORTIC  VALVE LVOT Vmax:   102.80 cm/s LVOT Vmean:  75.500 cm/s LVOT VTI:    0.239 m  AORTA Ao Root diam: 2.90 cm Ao Asc diam:  3.30 cm  MITRAL VALVE                TRICUSPID VALVE MV Area (PHT):              TR Peak grad:   30.5 mmHg MV Decel Time:              TR Vmax:        276.00 cm/s MV E velocity: 117.00 cm/s  Estimated RAP:  3.00 mmHg RVSP:           33.5 mmHg  SHUNTS Systemic VTI:  0.24 m Systemic Diam: 2.10 cm  Armanda Magic MD Electronically signed by Armanda Magic MD Signature Date/Time: 03/25/2021/4:20:17 PM    Final    MONITORS  LONG TERM MONITOR (3-14 DAYS) 06/10/2021  Narrative  100% AFIB Avg 56bpm  Longest pause 3 sec at 11 am - no symptoms reported  Rare PVC  Continue to watch for symptoms like syncope - she is on no AV nodal blockers.   Patch Wear Time:  13 days and 22 hours (2023-04-25T09:18:23-0400 to 2023-05-09T07:59:39-0400)  Atrial Fibrillation/Flutter occurred continuously (100% burden), ranging from 33-119 bpm (avg of 56 bpm). 1 Pause occurred lasting 3 secs (20 bpm). Isolated VEs were rare (<1.0%, 3264), VE Couplets were rare (<1.0%, 67), and VE Triplets were rare (<1.0%, 2). Ventricular Bigeminy and Trigeminy were present. MD notification criteria for Slow Atrial Fibrillation/Flutter met - report posted prior to notification per account request (BB). Donato Schultz, MD            EKG:  No new  Recent Labs: 11/20/2021: TSH 2.638 11/25/2021: Magnesium 2.2 11/27/2021: ALT 54; BUN 19; Creatinine, Ser 0.65; Hemoglobin 9.7; Platelets 380; Potassium 4.3; Sodium 139  Recent Lipid Panel    Component Value Date/Time   CHOL 123 05/07/2021 0000   TRIG 107 05/07/2021 0000   HDL 58 05/07/2021 0000   CHOLHDL 2.1 05/07/2021 0000   CHOLHDL 3.7 11/14/2019 0408   VLDL 25 11/14/2019 0408   LDLCALC 46 05/07/2021 0000     Risk Assessment/Calculations:               Physical Exam:    VS:  BP 118/66   Pulse 71   Ht 5' (1.524 m)   Wt 154 lb (69.9 kg)    LMP  (LMP Unknown)   SpO2 95%   BMI 30.08 kg/m     Wt Readings from Last 3 Encounters:  06/28/22 154 lb (69.9 kg)  05/05/22 155 lb (70.3 kg)  02/01/22 146 lb 9.6 oz (66.5 kg)     GEN:  Well nourished, well developed in no acute distress HEENT: Normal NECK: No JVD; No carotid bruits LYMPHATICS: No lymphadenopathy CARDIAC: Irreg, no murmurs, rubs, gallops RESPIRATORY:  Clear to auscultation without rales, wheezing or rhonchi  ABDOMEN: Soft, non-tender, non-distended MUSCULOSKELETAL:  No edema; No deformity  SKIN: Warm and dry NEUROLOGIC:  Alert and oriented x 3 PSYCHIATRIC:  Normal affect   ASSESSMENT:    1. Tachycardia-bradycardia syndrome (HCC)   2. Typical atrial flutter (HCC)    PLAN:    In order of problems listed above:  Atrial fibrillation longstanding persistent - 100% burden on ZIO monitor 05/2021 with average heart rate of 56 bpm ranging from 33-1 19.  Chronic anticoagulation - Continue with Eliquis.  Hemoglobin 9.7 creatinine 0.6  Prior atrial flutter ablation - 2007 Dr. Ladona Ridgel  History of stroke - Does have continued weakness fatigue Daily exercise encouraged  Hyperlipidemia - prior LDL 52 in September 2023 outside labs.  She is off of the atorvastatin 10 mg.  Obstructive sleep apnea - Dr. Waynard Edwards following            Medication Adjustments/Labs and Tests Ordered: Current medicines are reviewed at length with the patient today.  Concerns regarding medicines are outlined above.  No orders of the defined types were placed in this encounter.  No orders of the defined types were placed in this encounter.   Patient Instructions  Medication Instructions:  The current medical regimen is effective;  continue present plan and medications.  *If you need a refill on your cardiac medications before your next appointment, please call your pharmacy*  Follow-Up: At [facility], you and your health needs are our priority.  As part of our  continuing mission to provide you with exceptional heart care, we have created designated Provider Care Teams.  These Care Teams include your primary Cardiologist (physician) and Advanced Practice Providers (APPs -  Physician Assistants and Nurse Practitioners) who all work together to provide you with the care you need, when you need it.  We recommend signing up for the patient portal called "MyChart".  Sign up information is provided on this After Visit Summary.  MyChart is used to connect with patients for Virtual Visits (Telemedicine).  Patients are able to view lab/test results, encounter notes, upcoming appointments, etc.  Non-urgent messages can be sent to your provider as well.   To learn more about what you can do with MyChart, go to [email or web address].    Your next appointment:   1 year(s)  Provider:   Donato Schultz, MD        Signed, Donato Schultz, MD  06/28/2022 2:42 PM    [facility]

## 2022-06-28 NOTE — Patient Instructions (Signed)
Medication Instructions:  The current medical regimen is effective;  continue present plan and medications.  *If you need a refill on your cardiac medications before your next appointment, please call your pharmacy*  Follow-Up: At [facility], you and your health needs are our priority.  As part of our continuing mission to provide you with exceptional heart care, we have created designated Provider Care Teams.  These Care Teams include your primary Cardiologist (physician) and Advanced Practice Providers (APPs -  Physician Assistants and Nurse Practitioners) who all work together to provide you with the care you need, when you need it.  We recommend signing up for the patient portal called "MyChart".  Sign up information is provided on this After Visit Summary.  MyChart is used to connect with patients for Virtual Visits (Telemedicine).  Patients are able to view lab/test results, encounter notes, upcoming appointments, etc.  Non-urgent messages can be sent to your provider as well.   To learn more about what you can do with MyChart, go to [email or web address].    Your next appointment:   1 year(s)  Provider:   Mark Skains, MD      

## 2022-07-22 DIAGNOSIS — R319 Hematuria, unspecified: Secondary | ICD-10-CM | POA: Diagnosis not present

## 2022-07-22 DIAGNOSIS — R410 Disorientation, unspecified: Secondary | ICD-10-CM | POA: Diagnosis not present

## 2022-07-22 DIAGNOSIS — R509 Fever, unspecified: Secondary | ICD-10-CM | POA: Diagnosis not present

## 2022-07-27 ENCOUNTER — Encounter: Payer: Self-pay | Admitting: Neurology

## 2022-07-27 ENCOUNTER — Ambulatory Visit (INDEPENDENT_AMBULATORY_CARE_PROVIDER_SITE_OTHER): Payer: Medicare Other | Admitting: Neurology

## 2022-07-27 VITALS — BP 118/70 | HR 56 | Ht 60.0 in | Wt 156.0 lb

## 2022-07-27 DIAGNOSIS — G4733 Obstructive sleep apnea (adult) (pediatric): Secondary | ICD-10-CM

## 2022-07-27 NOTE — Progress Notes (Signed)
Subjective:    Lynch ID: [name] is a 89 y.o. female.  HPI    Interim history:   [name] is an 89 year old right-handed woman with an underlying medical history of paroxysmal atrial fibrillation, SVT, right PCA stroke with status post tPA and mechanical thrombectomy, sciatica, and overweight state, who presents for follow-up consultation of obstructive sleep apnea, on AutoPap therapy.  [name] is accompanied by [name] daughter today.  I last saw [name] on 12/15/2021, at which time she was reluctant to continue with AutoPap therapy.  We talked about alternative treatment options for sleep apnea in Melissa moderate range.  She was agreeable to continuing with treatment.   Today, 07/27/2022: I reviewed [name] AutoPap compliance data from Melissa past 30 days and 90 days.  She has been fairly consistent with using [name] machine, Melissa past 90 days she used [name] machine 84 out of 90 days, during 04/29/2022 and 07/27/2022.  Percent use days greater than 4 hours at 66%, indicating mildly suboptimal compliance, residual AHI mildly elevated at 10/h, 95th percentile of pressure at 7.9 cm with a range of 6 to 12 cm without EPR.  She reports not liking [name] AutoPap and is motivated to continue with treatment.  She continues to take low-dose Remeron at night, was originally started for depression per daughter.   Endorses daytime tiredness.  She feels like she sleeps well at night however.  She has nocturia about once per average night.  She does not put [name] mask on once she comes back from Melissa bathroom.    Previously:   I first met [name] at Melissa request of Dr. Pearlean Brownie and Ihor Austin, NP, on 07/07/2020, at which time she was reported to have significant daytime somnolence since [name] stroke and snoring.  She was advised to proceed with a sleep study.  We attempted a baseline sleep study in Melissa sleep lab but she did not have enough sleep time.  She was therefore advised to proceed with a home sleep test.  [name] HST from 08/20/2020  showed an AHI of 37.5/h, O2 nadir 82% with moderate to loud snoring detected.  She was advised to proceed with AutoPap therapy.   She has since then seen Ihor Austin, NP but has struggled with AutoPap tolerance.  She was seen on 05/26/2021, at which time she was compliant with [name] AutoPap, residual AHI was 5.7/h, 95th percentile of pressure at 7.6 cm.  She still had residual daytime somnolence.   I reviewed [name] AutoPap compliance data from 10/22/2021 through 11/21/2021.  She used [name] machine 14 out of 31 days, percent use days greater than 4 hours at 19%, residual AHI 9.7/h, primarily due to obstructive events, pressure range of 6-12, 95th percentile of pressure at 7.8, leak on Melissa higher side.    07/07/20: (She) reports excessive daytime somnolence since [name] stroke.  She has been sleeping long hours at night and also takes longer naps.  She has a tendency to nap in Melissa afternoons but typically would nap for 30 or 45 minutes but since [name] stroke she can sleep for up to 2 hours per daughter.  She has a history of snoring, it is considered soft.  No witnessed apneas or gasping sensations at night.  She has nocturia about once per average night and denies recurrent morning headaches or family history of sleep apnea.  She lives alone but [name] children have taken turns to stay with [name] since she lost [name] husband recently and also since [name]  recent stroke.  Since [name] stroke she had appetite loss and weight loss and was started on Remeron per PCP.  She is currently on 15 mg at bedtime.  I reviewed your office note from 05/13/2020.  [name] Epworth sleepiness score is 7 out of 24, fatigue severity score is 1 out of 63.  Bedtime is generally between 10 and 11 PM and rise time between 1030 and 11 AM.  She drinks caffeine in Melissa form of flavored water with an energy supplement, 1 serving per day, she drinks alcohol daily in Melissa form of wine, 4 ounce glass, 2/day on average.  She has not been advised to cut back.  She is a  non-smoker, quit smoking about 50 years ago.   [name]'s allergies, current medications, family history, past medical history, past social history, past surgical history and problem list were reviewed and updated as appropriate.    [name] Past Medical History Is Significant For: Past Medical History:  Diagnosis Date   Atrial fibrillation (HCC)    Prior treatment with amiodarone, held sinus rhythm and drug stopped   Atrial flutter (HCC)    Status post ablation prior to  2009   Chest pain    Nuclear, 2004, no scar or ischemia, EF 77%   Ejection fraction    EF 70%, nuclear,  2004  //      Fall    July, 2014   FUO (fever of unknown origin) 11/15/2021   H/O bladder repair surgery    OSA (obstructive sleep apnea)    Right Achilles tendinitis    Sciatica     [name] Past Surgical History Is Significant For: Past Surgical History:  Procedure Laterality Date   IR CT HEAD LTD  11/14/2019   IR PERCUTANEOUS ART THROMBECTOMY/INFUSION INTRACRANIAL INC DIAG ANGIO  11/14/2019   IR RADIOLOGIST EVAL & MGMT  02/20/2020   IR US GUIDE VASC ACCESS RIGHT  11/14/2019   no surgical hx     RADIOLOGY WITH ANESTHESIA N/A 11/13/2019   Procedure: IR WITH ANESTHESIA;  Surgeon: Julieanne Cotton, MD;  Location: [facility] OR;  Service: Radiology;  Laterality: N/A;    [name] Family History Is Significant For: Family History  Problem Relation Age of Onset   Cancer Mother    Cancer Father        pancreatic cancer   Heart disease Brother     [name] Social History Is Significant For: Social History   Socioeconomic History   Marital status: Widowed    Spouse name: Not on file   Number of children: 4   Years of education: Not on file   Highest education level: Not on file  Occupational History   Not on file  Tobacco Use   Smoking status: Former    Packs/day: 1.00    Years: 10.00    Additional pack years: 0.00    Total pack years: 10.00    Types: Cigarettes   Smokeless tobacco: Never  Vaping Use   Vaping  Use: Never used  Substance and Sexual Activity   Alcohol use: Yes   Drug use: Never   Sexual activity: Not on file  Other Topics Concern   Not on file  Social History Narrative   11/24/20 husband passed, children stay with [name] at night   Social Determinants of Health   Financial Resource Strain: Not on file  Food Insecurity: No Food Insecurity (01/04/2022)   Hunger Vital Sign    Worried About Running Out of Food in Melissa Last  Year: Never true    Ran Out of Food in Melissa Last Year: Never true  Transportation Needs: No Transportation Needs (01/04/2022)   PRAPARE - Administrator, Civil Service (Medical): No    Lack of Transportation (Non-Medical): No  Physical Activity: Not on file  Stress: Not on file  Social Connections: Not on file    [name] Allergies Are:  Allergies  Allergen Reactions   Bee Venom Anaphylaxis   Mixed Vespid Venom Anaphylaxis   Codeine Other (See Comments)    Exact allergic reaction not known   Sulfa Antibiotics Itching and Nausea Only   Erythromycin Nausea And Vomiting  :   [name] Current Medications Are:  Outpatient Encounter Medications as of 07/27/2022  Medication Sig   acetaminophen (TYLENOL) 325 MG tablet Take 2 tablets (650 mg total) by mouth every 6 (six) hours as needed for mild pain, fever or headache.   amLODipine (NORVASC) 2.5 MG tablet Take 1 tablet (2.5 mg total) by mouth daily.   Cholecalciferol (VITAMIN D3) 50 MCG (2000 UT) CAPS Take 2,000 Units by mouth at bedtime.   ciprofloxacin-dexamethasone (CIPRODEX) OTIC suspension Place 4 drops into Melissa left ear.   ELIQUIS 5 MG TABS tablet TAKE 1 TABLET TWICE A DAY   famotidine (PEPCID) 40 MG tablet Take 40 mg by mouth daily.   GEMTESA 75 MG TABS Take 75 mg by mouth daily.   ipratropium-albuterol (DUONEB) 0.5-2.5 (3) MG/3ML SOLN Take 3 mLs by nebulization every 6 (six) hours as needed.   mirtazapine (REMERON) 7.5 MG tablet Take 1 tablet (7.5 mg total) by mouth at bedtime.   Multiple  Vitamins-Minerals (SENIOR VITES PO) Take 1 tablet by mouth daily with breakfast.   nitrofurantoin, macrocrystal-monohydrate, (MACROBID) 100 MG capsule Take 100 mg by mouth 2 (two) times daily.   ondansetron (ZOFRAN) 4 MG tablet Take 1 tablet (4 mg total) by mouth every 6 (six) hours as needed for nausea.   polyethylene glycol (MIRALAX / GLYCOLAX) 17 g packet Take 17 g by mouth 2 (two) times daily as needed for mild constipation.   Probiotic Product (PHILLIPS COLON HEALTH) CAPS Take 1 capsule by mouth daily.   Spacer/Aero-Holding Chambers (AEROCHAMBER Z-STAT PLUS CHAMBR) MISC See admin instructions.   [DISCONTINUED] Ipratropium-Albuterol (COMBIVENT) 20-100 MCG/ACT AERS respimat Inhale 1 puff into Melissa lungs every 6 (six) hours as needed for wheezing or shortness of breath. (Lynch not taking: Reported on 07/27/2022)   No facility-administered encounter medications on file as of 07/27/2022.  :  Review of Systems:  Out of a complete 14 point review of systems, all are reviewed and negative with Melissa exception of these symptoms as listed below:  Review of Systems  Neurological:        RM 9 with daughter stephanie Pt is well and stable, no new CVA concerns since last visit. Does report she doesn't feel like CPAP is helping and she dislikes it.     Objective:  Neurological Exam  Physical Exam Physical Examination:   Vitals:   07/27/22 1233  BP: 118/70  Pulse: (!) 56    General Examination: [name] is a very pleasant 89 y.o. female in no acute distress. She appears well-developed and well-nourished and well groomed.   HEENT: Normocephalic, atraumatic, pupils are equal, round and reactive to light, corrective eyeglasses in place, tracking is well-preserved.  Hearing is grossly intact.  Face symmetric.  Speech without dysarthria.  Airway examination with stable findings, mild mouth dryness noted, minimal overbite.  Chest: Clear to auscultation without wheezing, rhonchi or crackles  noted.   Heart: S1+S2+0, regular and normal without murmurs, rubs or gallops noted.    Abdomen: Soft, non-tender and non-distended.   Extremities: There is no obvious edema in Melissa distal lower extremities bilaterally.    Skin: Warm and dry without trophic changes noted.  Chronic appearing bruising on Melissa arms.  Of note, she is on Eliquis.   Musculoskeletal: exam reveals no obvious joint deformities.     Neurologically:  Mental status: [name] is awake, alert and oriented in all 4 spheres. [name] immediate and remote memory, attention, language skills and fund of knowledge are appropriate. Mood is normal and affect is normal.  Cranial nerves II - XII are as described above under HEENT exam.  Motor exam: Thin bulk, global strength about 4 out of 5, no obvious action or resting tremor.   Sensory exam: intact to light touch in Melissa upper and lower extremities.  She walks without difficulty.  Assessment and Plan:  In summary, MARLITA FEUTZ is an 89 year old right-handed woman with an underlying medical history of paroxysmal atrial fibrillation, SVT, right PCA stroke with status post tPA and mechanical thrombectomy, sciatica, and overweight state, who presents for follow-up consultation of obstructive sleep apnea, on AutoPap therapy.  She is compliant with treatment.  She is commended for [name] treatment adherence.  I will change Melissa AutoPap order to a set pressure of 9 cm and Melissa hope to improve apnea control.  She tolerates [name] nasal mask, she is up-to-date with supplies per daughter.  She is advised to follow-up routinely to see Melissa nurse practitioner, Ihor Austin, in 1 year.  I answered all their questions today and they were in agreement. I spent 20 minutes in total face-to-face time and in reviewing records during pre-charting, more than 50% of which was spent in counseling and coordination of care, reviewing test results, reviewing medications and treatment regimen and/or in discussing or  reviewing Melissa diagnosis of OSA, Melissa prognosis and treatment options. Pertinent laboratory and imaging test results that were available during this visit with [name] were reviewed by me and considered in my medical decision making (see chart for details).

## 2022-07-27 NOTE — Patient Instructions (Signed)
Please continue using your autoPAP regularly. While your insurance requires that you use PAP at least 4 hours each night on 70% of the nights, I recommend, that you not skip any nights and use it throughout the night if you can. Getting used to PAP and staying with the treatment long term does take time and patience and discipline. Untreated obstructive sleep apnea when it is moderate to severe can have an adverse impact on cardiovascular health and raise her risk for heart disease, arrhythmias, hypertension, congestive heart failure, stroke and diabetes. Untreated obstructive sleep apnea causes sleep disruption, nonrestorative sleep, and sleep deprivation. This can have an impact on your day to day functioning and cause daytime sleepiness and impairment of cognitive function, memory loss, mood disturbance, and problems focussing. Using PAP regularly can improve these symptoms.   

## 2022-07-28 ENCOUNTER — Encounter: Payer: Self-pay | Admitting: *Deleted

## 2022-07-28 ENCOUNTER — Telehealth: Payer: Self-pay | Admitting: *Deleted

## 2022-07-28 NOTE — Telephone Encounter (Signed)
error 

## 2022-07-28 NOTE — Telephone Encounter (Signed)
Cpap order sent to [facility]. Received confirmation of order.

## 2022-08-18 DIAGNOSIS — D2262 Melanocytic nevi of left upper limb, including shoulder: Secondary | ICD-10-CM | POA: Diagnosis not present

## 2022-08-18 DIAGNOSIS — L720 Epidermal cyst: Secondary | ICD-10-CM | POA: Diagnosis not present

## 2022-08-18 DIAGNOSIS — D2271 Melanocytic nevi of right lower limb, including hip: Secondary | ICD-10-CM | POA: Diagnosis not present

## 2022-08-18 DIAGNOSIS — L570 Actinic keratosis: Secondary | ICD-10-CM | POA: Diagnosis not present

## 2022-08-18 DIAGNOSIS — D225 Melanocytic nevi of trunk: Secondary | ICD-10-CM | POA: Diagnosis not present

## 2022-08-18 DIAGNOSIS — L821 Other seborrheic keratosis: Secondary | ICD-10-CM | POA: Diagnosis not present

## 2022-08-18 DIAGNOSIS — Z85828 Personal history of other malignant neoplasm of skin: Secondary | ICD-10-CM | POA: Diagnosis not present

## 2022-08-19 ENCOUNTER — Ambulatory Visit: Payer: Medicare Other | Admitting: Neurology

## 2022-08-30 ENCOUNTER — Ambulatory Visit: Payer: Self-pay

## 2022-08-30 NOTE — Patient Instructions (Signed)
Visit Information  Thank you for taking time to visit with me today. Please don't hesitate to contact me if I can be of assistance to you.   Following are the goals we discussed today:   Goals Addressed             This Visit's Progress    To clarify use of prescribed antibiotic for reoccurring UTI       Care Coordination Interventions: Completed successful outbound call with daughter [name]  Evaluation of current treatment plan related to chronic UTI and patient's adherence to plan as established by provider Determined Pulmonology approved prophylactic therapy with Macrobid for UTI prevention  Reviewed and discussed PCP recommendations to start a 5 day course of Macrobid upon first symptoms of UTI Reiterated importance for patient to stay well hydrated with water and daughter verbalizes patient is doing so Encouraged daughter to keep PCP well informed of new symptoms or concerns promptly       To completed lung CT and use administer breathing treatments as directed       Care Coordination Interventions: Completed successful outbound call with daughter [name] Discussed patient's lung condition is stable at this time and daughter voices having no concerns Reviewed next upcoming scheduled f/u with Pulmonology scheduled for 09/02/22 @1 :30 PM Reinforced to daughter importance of using infection precautions to help lower risk of patient getting sick, especially with increase in COVID cases Instructed daughter to notify patient's doctor of new symptoms or concerns         Our next appointment is by telephone on 11/30/22 at 09:00 AM  Please call the care guide team at [phone] if you need to cancel or reschedule your appointment.   If you are experiencing a Mental Health or Behavioral Health Crisis or need someone to talk to, please call 1-800-273-TALK (toll free, 24 hour hotline)  Patient verbalizes understanding of instructions and care plan provided today and agrees to view  in MyChart. Active MyChart status and patient understanding of how to access instructions and care plan via MyChart confirmed with patient.     Delsa Sale, RN, BSN, CCM Care Management Coordinator [facility] Care Management  Direct Phone: [phone]

## 2022-08-30 NOTE — Patient Outreach (Signed)
  Care Coordination   Follow Up Visit Note   08/30/2022 Name: [name] MRN: [id number] DOB: [birth year]  [name] is a 89 y.o. year old female who sees Rodrigo Ran, MD for primary care. I spoke with daughter [name] by phone today.  What matters to the patients health and wellness today?  Patient would like to stay healthy and continue to feel like traveling with her family.     Goals Addressed             This Visit's Progress    To clarify use of prescribed antibiotic for reoccurring UTI       Care Coordination Interventions: Completed successful outbound call with daughter [name]  Evaluation of current treatment plan related to chronic UTI and patient's adherence to plan as established by provider Determined Pulmonology approved prophylactic therapy with Macrobid for UTI prevention  Reviewed and discussed PCP recommendations to start a 5 day course of Macrobid upon first symptoms of UTI Reiterated importance for patient to stay well hydrated with water and daughter verbalizes patient is doing so Encouraged daughter to keep PCP well informed of new symptoms or concerns promptly       To completed lung CT and use administer breathing treatments as directed       Care Coordination Interventions: Completed successful outbound call with daughter [name] Discussed patient's lung condition is stable at this time and daughter voices having no concerns Reviewed next upcoming scheduled f/u with Pulmonology scheduled for 09/02/22 @1 :30 PM Reinforced to daughter importance of using infection precautions to help lower risk of patient getting sick, especially with increase in COVID cases Instructed daughter to notify patient's doctor of new symptoms or concerns     Interventions Today    Flowsheet Row Most Recent Value  Chronic Disease   Chronic disease during today's visit Other, Atrial Fibrillation (AFib)  [ILD,  UTI]  General Interventions   General Interventions  Discussed/Reviewed General Interventions Reviewed, General Interventions Discussed, Doctor Visits  Doctor Visits Discussed/Reviewed Doctor Visits Discussed, Doctor Visits Reviewed, Specialist, PCP  Exercise Interventions   Exercise Discussed/Reviewed Physical Activity  Physical Activity Discussed/Reviewed Physical Activity Discussed, Physical Activity Reviewed, Types of exercise  Education Interventions   Education Provided Provided Education  Provided Verbal Education On Exercise, Medication, When to see the doctor, Nutrition  Nutrition Interventions   Nutrition Discussed/Reviewed Nutrition Reviewed, Fluid intake, Nutrition Discussed  Pharmacy Interventions   Pharmacy Dicussed/Reviewed Pharmacy Topics Discussed, Pharmacy Topics Reviewed, Medications and their functions          SDOH assessments and interventions completed:  No     Care Coordination Interventions:  Yes, provided   Follow up plan: Follow up call scheduled for 11/30/22 @09 :00 AM    Encounter Outcome:  Pt. Visit Completed

## 2022-09-02 ENCOUNTER — Telehealth (INDEPENDENT_AMBULATORY_CARE_PROVIDER_SITE_OTHER): Payer: Medicare Other | Admitting: Adult Health

## 2022-09-02 DIAGNOSIS — J849 Interstitial pulmonary disease, unspecified: Secondary | ICD-10-CM

## 2022-09-02 DIAGNOSIS — J432 Centrilobular emphysema: Secondary | ICD-10-CM | POA: Diagnosis not present

## 2022-09-02 MED ORDER — ALBUTEROL SULFATE HFA 108 (90 BASE) MCG/ACT IN AERS: 1.0000 | INHALATION_SPRAY | Freq: Four times a day (QID) | RESPIRATORY_TRACT | 2 refills | Status: AC | PRN

## 2022-09-02 NOTE — Patient Instructions (Signed)
Discuss with Urology if substitute another antibiotic for Macrodantin since this can cause scarring in the lungs  Use Duoneb Twice daily    Albuterol inhaler As needed    Follow up with Dr. Vassie Loll  in 6 months with PFT and As needed

## 2022-09-02 NOTE — Progress Notes (Signed)
 @  Patient ID: [name], female    DOB: 08-29-1931, 89 y.o.   MRN: [id number]  Chief Complaint  Patient presents with   Follow-up    Referring provider: Rodrigo Ran, MD   Virtual Visit via Video Note  I connected with [name] on 09/02/22 at  1:30 PM EDT by a video enabled telemedicine application and verified that I am speaking with the correct person using two identifiers.  Location: Patient: Home (w/ daughter/son)  Provider: Office    I discussed the limitations of evaluation and management by telemedicine and the availability of in person appointments. The patient expressed understanding and agreed to proceed.  History of Present Illness: HPI: 89 year old female former smoker followed for COPD with emphysema, interstitial lung disease (?related to Macrodantin), OSA (on CPAP from Neurology)   Medical history significant for chronic urinary tract infections previously on Macrodantin, stroke. A Fib    09/02/2022 Follow up ; COPD with Emphysema , ILD   Today's virtual visit is a  33-month follow-up.  Patient has underlying COPD with emphysema.  She has interstitial lung disease changes on CT scan.  Felt to be possibly secondary to previous Macrodantin use plus or minus COVID-19 pneumonia.  Patient has been recommended in the past to stop Macrodantin most recently recommended in April to stop using this for chronic UTIs.  She is accompanied by her daughter. She has not stopped Macrodantin, says that Urology said they are not to stop. They are aware of the possible pulmonary toxicity side effects of macrodantin.  Patient says overall breathing is doing okay with no dyspnea at rest. Is sedentary . Will get winded with prolonged walking. Is able to upstairs.  She remains on DuoNeb once to twice daily . Family members help with her care. Son stays at night with her. She is alone during the daytime. Does not drive.  No fever or discolored mucus.  High-resolution CT chest Jun 23, 2022 showed mild patchy subpleural reticulation with no interval progression.      Observations/Objective: NAD   Assessment and Plan: ILD ? Etiology . CT chest -UIP alternative. No progression on CT chest 05/2022. ? Macrodantin related. With underlying Emphysema and history of Covid 19 bronchitis  Check PFT on return  Recommended alternative to Macrodantin -discuss with Urology . 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   OSA -on CPAP -keep follow up with Neurology   Plan  Patient Instructions  Discuss with Urology if substitute another antibiotic for Macrodantin since this can cause scarring in the lungs  Use Duoneb Twice daily    Albuterol inhaler As needed    Follow up with Dr. Vassie Loll  in 6 months with PFT and As needed        Follow Up Instructions:    I discussed the assessment and treatment plan with the patient. The patient was provided an opportunity to ask questions and all were answered. The patient agreed with the plan and demonstrated an understanding of the instructions.   The patient was advised to call back or seek an in-person evaluation if the symptoms worsen or if the condition fails to improve as anticipated.  I provided 30  minutes of non-face-to-face time during this encounter.   Rubye Oaks, NP

## 2022-09-03 ENCOUNTER — Ambulatory Visit: Payer: Medicare Other | Admitting: Adult Health

## 2022-10-06 DIAGNOSIS — N390 Urinary tract infection, site not specified: Secondary | ICD-10-CM | POA: Diagnosis not present

## 2022-10-14 ENCOUNTER — Ambulatory Visit: Payer: Self-pay

## 2022-10-14 NOTE — Patient Instructions (Signed)
Visit Information  Thank you for taking time to visit with me today. Please don't hesitate to contact me if I can be of assistance to you.   Following are the goals we discussed today:   Goals Addressed             This Visit's Progress    To follow up with Endocrinology evaluation of diabetes and other endocrine disorder       Care Coordination Interventions: Provided education to patient about basic DM disease process Counseled on importance of regular laboratory monitoring as prescribed Review of patient status, including review of consultants reports, relevant laboratory and other test results, and medications completed Reviewed and discussed with daughter [name], patient's new patient appointment scheduled with Endocrinology, Dr. Lonzo Cloud, scheduled for 10/19/22 @2 :00 PM, [name] will accompany her mom to this appointment  Lab Results  Component Value Date   HGBA1C 6.4 (H) 11/20/2021          Our next appointment is by telephone on 12/09/22 at 09:00 AM  Please call the care guide team at [phone] if you need to cancel or reschedule your appointment.   If you are experiencing a Mental Health or Behavioral Health Crisis or need someone to talk to, please call 1-800-273-TALK (toll free, 24 hour hotline)  Patient verbalizes understanding of instructions and care plan provided today and agrees to view in MyChart. Active MyChart status and patient understanding of how to access instructions and care plan via MyChart confirmed with patient.     Delsa Sale RN BSN CCM [place]  [facility], [facility] Health Nurse Care Coordinator  Direct Dial: [phone] Website: [email or web address]

## 2022-10-14 NOTE — Progress Notes (Signed)
This encounter was created in error - please disregard.

## 2022-10-14 NOTE — Patient Outreach (Signed)
Care Coordination   Follow Up Visit Note   10/14/2022 Name: YENTL RUDESILL MRN: [id number] DOB: 1931/08/13  Corinna Capra is a 89 y.o. year old female who sees Rodrigo Ran, MD for primary care. I spoke with daughter [name] by phone today.  What matters to the patients health and wellness today?  Patient's daughter will accompany patient to her new patient appointment with Endocrinology for evaluation of diabetes and other endocrine disorders.     Goals Addressed             This Visit's Progress    To follow up with Endocrinology evaluation of diabetes and other endocrine disorder       Care Coordination Interventions: Provided education to patient about basic DM disease process Counseled on importance of regular laboratory monitoring as prescribed Review of patient status, including review of consultants reports, relevant laboratory and other test results, and medications completed Reviewed and discussed with daughter [name], patient's new patient appointment scheduled with Endocrinology, Dr. Lonzo Cloud, scheduled for 10/19/22 @2 :00 PM, [name] will accompany her mom to this appointment  Lab Results  Component Value Date   HGBA1C 6.4 (H) 11/20/2021     Interventions Today    Flowsheet Row Most Recent Value  Chronic Disease   Chronic disease during today's visit Diabetes  General Interventions   General Interventions Discussed/Reviewed General Interventions Discussed, General Interventions Reviewed, Labs, Doctor Visits  Doctor Visits Discussed/Reviewed Doctor Visits Discussed, Doctor Visits Reviewed, Specialist, PCP  Education Interventions   Education Provided Provided Education  Provided Verbal Education On Labs, When to see the doctor  Labs Reviewed Hgb A1c          SDOH assessments and interventions completed:  No     Care Coordination Interventions:  Yes, provided   Follow up plan: Follow up call scheduled for 12/09/22 @09 :00 AM    Encounter  Outcome:  Patient Visit Completed

## 2022-10-19 ENCOUNTER — Ambulatory Visit (INDEPENDENT_AMBULATORY_CARE_PROVIDER_SITE_OTHER): Payer: Medicare Other | Admitting: Internal Medicine

## 2022-10-19 ENCOUNTER — Encounter: Payer: Self-pay | Admitting: Internal Medicine

## 2022-10-19 VITALS — BP 122/80 | HR 78 | Ht 60.0 in | Wt 151.0 lb

## 2022-10-19 DIAGNOSIS — E2740 Unspecified adrenocortical insufficiency: Secondary | ICD-10-CM

## 2022-10-19 MED ORDER — HYDROCORTISONE 5 MG PO TABS: 5.0000 mg | ORAL_TABLET | ORAL | 2 refills | Status: DC

## 2022-10-19 NOTE — Progress Notes (Unsigned)
Name: [name]  MRN/ DOB: [id number], 07-06-1931    Age/ Sex: 89 y.o., female    PCP: Rodrigo Ran, MD   Reason for Endocrinology Evaluation: Adrenal insufficiency     Date of Initial Endocrinology Evaluation: 10/19/2022     HPI: [name] is a 89 y.o. female with a past medical history of A-fib, OSA, interstitial lung disease. The patient presented for initial endocrinology clinic visit on 10/19/2022 for consultative assistance with her adrenal insufficiency.   The patient has been referred here for further evaluation of adrenal insufficiency  She is accompanied by her daughter   She was started on glucocorticoids ~ 1.5 yrs ago for back pain,  but since than she has not been able to get off of them   Symptoms of extreme weakness , requiring hospitalization  No nausea, headaches or vision changes  Has chronic UTI's  She bruises easily  Back pain resolved  Weight has been stable  She has a walker that uses intermittently  Denies falls  She has fatigue , does not always use CPAP machine   She has been diagnosed with pre-diabetes  with no medication     Medication prednisone 5 mg daily  HISTORY:  Past Medical History:  Past Medical History:  Diagnosis Date   Atrial fibrillation (HCC)    Prior treatment with amiodarone, held sinus rhythm and drug stopped   Atrial flutter (HCC)    Status post ablation prior to  2009   Chest pain    Nuclear, 2004, no scar or ischemia, EF 77%   Ejection fraction    EF 70%, nuclear,  2004  //      Fall    July, 2014   FUO (fever of unknown origin) 11/15/2021   H/O bladder repair surgery    OSA (obstructive sleep apnea)    Right Achilles tendinitis    Sciatica    Past Surgical History:  Past Surgical History:  Procedure Laterality Date   IR CT HEAD LTD  11/14/2019   IR PERCUTANEOUS ART THROMBECTOMY/INFUSION INTRACRANIAL INC DIAG ANGIO  11/14/2019   IR RADIOLOGIST EVAL & MGMT  02/20/2020   IR US GUIDE VASC ACCESS RIGHT   11/14/2019   no surgical hx     RADIOLOGY WITH ANESTHESIA N/A 11/13/2019   Procedure: IR WITH ANESTHESIA;  Surgeon: Julieanne Cotton, MD;  Location: [facility] OR;  Service: Radiology;  Laterality: N/A;    Social History:  reports that she has quit smoking. Her smoking use included cigarettes. She has a 10 pack-year smoking history. She has never used smokeless tobacco. She reports current alcohol use. She reports that she does not use drugs. Family History: family history includes Cancer in her father and mother; Heart disease in her brother.   HOME MEDICATIONS: Allergies as of 10/19/2022       Reactions   Bee Venom Anaphylaxis   Mixed Vespid Venom Anaphylaxis   Codeine Other (See Comments)   Exact allergic reaction not known   Sulfa Antibiotics Itching, Nausea Only   Erythromycin Nausea And Vomiting   Other Itching, Nausea Only, Other (See Comments)        Medication List        Accurate as of October 19, 2022  2:14 PM. If you have any questions, ask your nurse or doctor.          acetaminophen 325 MG tablet Commonly known as: TYLENOL Take 2 tablets (650 mg total) by mouth every 6 (six)  hours as needed for mild pain, fever or headache.   AeroChamber Z-Stat Plus Chambr Misc See admin instructions.   albuterol 108 (90 Base) MCG/ACT inhaler Commonly known as: VENTOLIN HFA Inhale 1-2 puffs into the lungs every 6 (six) hours as needed.   amLODipine 2.5 MG tablet Commonly known as: NORVASC Take 1 tablet (2.5 mg total) by mouth daily.   cefdinir 300 MG capsule Commonly known as: OMNICEF Take 300 mg by mouth 2 (two) times daily.   ciprofloxacin-dexamethasone OTIC suspension Commonly known as: CIPRODEX Place 4 drops into the left ear.   Cranberry 400 MG Tabs Take 1 tablet by mouth daily.   Eliquis 5 MG Tabs tablet Generic drug: apixaban TAKE 1 TABLET TWICE A DAY   famotidine 40 MG tablet Commonly known as: PEPCID Take 40 mg by mouth daily.   fosfomycin 3 g  Pack Commonly known as: MONUROL Take by mouth.   Gemtesa 75 MG Tabs Generic drug: Vibegron Take 75 mg by mouth daily.   ipratropium-albuterol 0.5-2.5 (3) MG/3ML Soln Commonly known as: DUONEB Take 3 mLs by nebulization every 6 (six) hours as needed.   ondansetron 4 MG tablet Commonly known as: ZOFRAN Take 1 tablet (4 mg total) by mouth every 6 (six) hours as needed for nausea.   [facility] Colon Health Caps Take 1 capsule by mouth daily.   predniSONE 5 MG tablet Commonly known as: DELTASONE Take 5 mg by mouth daily with breakfast.   SENIOR VITES PO Take 1 tablet by mouth daily with breakfast.   trimethoprim 100 MG tablet Commonly known as: TRIMPEX Take 100 mg by mouth daily.   vitamin D3 50 MCG (2000 UT) Caps Take 2,000 Units by mouth at bedtime.          REVIEW OF SYSTEMS: A comprehensive ROS was conducted with the patient and is negative except as per HPI    OBJECTIVE:  VS: BP 122/80 (BP Location: Left Arm, Patient Position: Sitting, Cuff Size: Large)   Pulse 78   Ht 5' (1.524 m)   Wt 151 lb (68.5 kg)   LMP  (LMP Unknown)   SpO2 98%   BMI 29.49 kg/m    Wt Readings from Last 3 Encounters:  10/19/22 151 lb (68.5 kg)  07/27/22 156 lb (70.8 kg)  06/28/22 154 lb (69.9 kg)   Body surface area is 1.7 meters squared.   EXAM: General: Pt appears well and is in NAD  Eyes: External eye exam normal without stare, lid lag or exophthalmos.  EOM intact.  PERRL.  Neck: General: Supple without adenopathy. Thyroid: Thyroid size normal.  No goiter or nodules appreciated.   Lungs: Clear with good BS bilat   Heart: Auscultation: RRR.  Abdomen: Soft, nontender  Extremities:  BL LE: No pretibial edema   Mental Status: Judgment, insight: Intact Orientation: Oriented to time, place, and person Mood and affect: No depression, anxiety, or agitation     DATA REVIEWED: ***    ASSESSMENT/PLAN/RECOMMENDATIONS:   Secondary adrenal insufficiency:    Medications  :  Signed electronically by: Lyndle Herrlich, MD  [facility]  [facility] [address] [place], [place] [address] Phone: [phone] FAX: [phone]   CC: Rodrigo Ran, MD [address] [place] [address] Phone: [phone] Fax: [phone]   Return to Endocrinology clinic as below: Future Appointments  Date Time Provider Department Center  12/09/2022  9:00 AM Little, Karma Lew, RN [facility] None  07/27/2023  1:15 PM Ihor Austin, NP  [facility] None

## 2022-10-19 NOTE — Patient Instructions (Signed)
Stop Prednisone  Hydrocortisone 5 mg, take 3 tablets every morning with breakfast and 1 tablet in the afternoon    ADRENAL INSUFFICIENCY SICK DAY RULES:  Should you face an extreme emotional or physical stress such as trauma, surgery or acute illness, this will require extra steroid coverage so that the body can meet that stress.   Without increasing the steroid dose you may experience severe weakness, headache, dizziness, nausea and vomiting and possibly a more serious deterioration in health.  Typically the dose of steroids will only need to be increased for a couple of days if you have an illness that is transient and managed in the community.   If you are unable to take/absorb an increased dose of steroids orally because of vomiting or diarrhea, you will urgently require steroid injections and should present to an Emergency Department.  The general advice for any serious illness is as follows: Double the normal daily steroid dose for up to 3 days if you have a temperature of more than 37.50C (99.73F) with signs of sickness, or severe emotional or physical distress Contact your primary care doctor and Endocrinologist if the illness worsens or it lasts for more than 3 days.  In cases of severe illness, urgent medical assistance should be promptly sought. If you experience vomiting/diarrhea or are unable to take steroids by mouth, please administer the Hydrocortisone injection kit and seek urgent medical help.

## 2022-10-20 ENCOUNTER — Encounter (HOSPITAL_COMMUNITY): Payer: Self-pay | Admitting: Emergency Medicine

## 2022-10-20 ENCOUNTER — Other Ambulatory Visit: Payer: Self-pay

## 2022-10-20 ENCOUNTER — Inpatient Hospital Stay (HOSPITAL_COMMUNITY)
Admission: EM | Admit: 2022-10-20 | Discharge: 2022-10-22 | DRG: 690 | Disposition: A | Discharge status: Home / Self Care | Payer: Medicare Other | Attending: Internal Medicine | Admitting: Internal Medicine

## 2022-10-20 DIAGNOSIS — R32 Unspecified urinary incontinence: Secondary | ICD-10-CM | POA: Diagnosis not present

## 2022-10-20 DIAGNOSIS — N3281 Overactive bladder: Secondary | ICD-10-CM | POA: Diagnosis not present

## 2022-10-20 DIAGNOSIS — Z87440 Personal history of urinary (tract) infections: Secondary | ICD-10-CM | POA: Diagnosis not present

## 2022-10-20 DIAGNOSIS — I10 Essential (primary) hypertension: Secondary | ICD-10-CM | POA: Diagnosis not present

## 2022-10-20 DIAGNOSIS — Z91038 Other insect allergy status: Secondary | ICD-10-CM | POA: Diagnosis not present

## 2022-10-20 DIAGNOSIS — Z91030 Bee allergy status: Secondary | ICD-10-CM | POA: Diagnosis not present

## 2022-10-20 DIAGNOSIS — Z87891 Personal history of nicotine dependence: Secondary | ICD-10-CM

## 2022-10-20 DIAGNOSIS — Z1624 Resistance to multiple antibiotics: Secondary | ICD-10-CM | POA: Diagnosis present

## 2022-10-20 DIAGNOSIS — J439 Emphysema, unspecified: Secondary | ICD-10-CM | POA: Diagnosis present

## 2022-10-20 DIAGNOSIS — I480 Paroxysmal atrial fibrillation: Secondary | ICD-10-CM | POA: Diagnosis present

## 2022-10-20 DIAGNOSIS — B961 Klebsiella pneumoniae [K. pneumoniae] as the cause of diseases classified elsewhere: Secondary | ICD-10-CM | POA: Diagnosis present

## 2022-10-20 DIAGNOSIS — Z882 Allergy status to sulfonamides status: Secondary | ICD-10-CM

## 2022-10-20 DIAGNOSIS — N179 Acute kidney failure, unspecified: Secondary | ICD-10-CM | POA: Diagnosis not present

## 2022-10-20 DIAGNOSIS — A498 Other bacterial infections of unspecified site: Secondary | ICD-10-CM

## 2022-10-20 DIAGNOSIS — K219 Gastro-esophageal reflux disease without esophagitis: Secondary | ICD-10-CM | POA: Diagnosis present

## 2022-10-20 DIAGNOSIS — Z888 Allergy status to other drugs, medicaments and biological substances status: Secondary | ICD-10-CM

## 2022-10-20 DIAGNOSIS — Z885 Allergy status to narcotic agent status: Secondary | ICD-10-CM

## 2022-10-20 DIAGNOSIS — E2740 Unspecified adrenocortical insufficiency: Secondary | ICD-10-CM | POA: Diagnosis not present

## 2022-10-20 DIAGNOSIS — Z8249 Family history of ischemic heart disease and other diseases of the circulatory system: Secondary | ICD-10-CM | POA: Diagnosis not present

## 2022-10-20 DIAGNOSIS — Z7901 Long term (current) use of anticoagulants: Secondary | ICD-10-CM

## 2022-10-20 DIAGNOSIS — G4733 Obstructive sleep apnea (adult) (pediatric): Secondary | ICD-10-CM | POA: Diagnosis not present

## 2022-10-20 DIAGNOSIS — N3001 Acute cystitis with hematuria: Secondary | ICD-10-CM | POA: Diagnosis not present

## 2022-10-20 DIAGNOSIS — Z7952 Long term (current) use of systemic steroids: Secondary | ICD-10-CM

## 2022-10-20 DIAGNOSIS — Z881 Allergy status to other antibiotic agents status: Secondary | ICD-10-CM | POA: Diagnosis not present

## 2022-10-20 DIAGNOSIS — Z1612 Extended spectrum beta lactamase (ESBL) resistance: Secondary | ICD-10-CM

## 2022-10-20 DIAGNOSIS — Z8619 Personal history of other infectious and parasitic diseases: Secondary | ICD-10-CM

## 2022-10-20 LAB — CBC WITH DIFFERENTIAL/PLATELET
Abs Immature Granulocytes: 0.04 10*3/uL (ref 0.00–0.07)
Basophils Absolute: 0.1 10*3/uL (ref 0.0–0.1)
Basophils Relative: 1 %
Eosinophils Absolute: 0.2 10*3/uL (ref 0.0–0.5)
Eosinophils Relative: 2 %
HCT: 46.8 % — ABNORMAL HIGH (ref 36.0–46.0)
Hemoglobin: 15.2 g/dL — ABNORMAL HIGH (ref 12.0–15.0)
Immature Granulocytes: 1 %
Lymphocytes Relative: 24 %
Lymphs Abs: 2.1 10*3/uL (ref 0.7–4.0)
MCH: 32.4 pg (ref 26.0–34.0)
MCHC: 32.5 g/dL (ref 30.0–36.0)
MCV: 99.8 fL (ref 80.0–100.0)
Monocytes Absolute: 0.9 10*3/uL (ref 0.1–1.0)
Monocytes Relative: 10 %
Neutro Abs: 5.6 10*3/uL (ref 1.7–7.7)
Neutrophils Relative %: 62 %
Platelets: 264 10*3/uL (ref 150–400)
RBC: 4.69 MIL/uL (ref 3.87–5.11)
RDW: 13 % (ref 11.5–15.5)
WBC: 8.8 10*3/uL (ref 4.0–10.5)
nRBC: 0 % (ref 0.0–0.2)

## 2022-10-20 LAB — BASIC METABOLIC PANEL
Anion gap: 9 (ref 5–15)
BUN: 16 mg/dL (ref 6–23)
BUN: 23 mg/dL (ref 8–23)
CO2: 26 mEq/L (ref 19–32)
CO2: 25 mmol/L (ref 22–32)
Calcium: 9.7 mg/dL (ref 8.4–10.5)
Calcium: 8.9 mg/dL (ref 8.9–10.3)
Chloride: 104 mEq/L (ref 96–112)
Chloride: 104 mmol/L (ref 98–111)
Creatinine, Ser: 1.27 mg/dL — ABNORMAL HIGH (ref 0.40–1.20)
Creatinine, Ser: 1.36 mg/dL — ABNORMAL HIGH (ref 0.44–1.00)
GFR, Estimated: 37 mL/min — ABNORMAL LOW (ref >60)
GFR: 37.44 mL/min — ABNORMAL LOW (ref >60.00)
Glucose, Bld: 106 mg/dL — ABNORMAL HIGH (ref 70–99)
Glucose, Bld: 108 mg/dL — ABNORMAL HIGH (ref 70–99)
Potassium: 4.7 mEq/L (ref 3.5–5.1)
Potassium: 3.8 mmol/L (ref 3.5–5.1)
Sodium: 141 mEq/L (ref 135–145)
Sodium: 138 mmol/L (ref 135–145)

## 2022-10-20 LAB — URINALYSIS, ROUTINE W REFLEX MICROSCOPIC
Bilirubin Urine: NEGATIVE
Glucose, UA: NEGATIVE mg/dL
Hgb urine dipstick: NEGATIVE
Ketones, ur: NEGATIVE mg/dL
Nitrite: NEGATIVE
Protein, ur: 100 mg/dL — AB
Specific Gravity, Urine: 1.021 (ref 1.005–1.030)
WBC, UA: >50 WBC/hpf (ref 0–5)
pH: 5 (ref 5.0–8.0)

## 2022-10-20 LAB — TSH: TSH: 2.78 u[IU]/mL (ref 0.35–5.50)

## 2022-10-20 LAB — I-STAT CG4 LACTIC ACID, ED: Lactic Acid, Venous: 1.3 mmol/L (ref 0.5–1.9)

## 2022-10-20 LAB — CORTISOL: Cortisol, Plasma: 5.5 ug/dL

## 2022-10-20 MED ORDER — MIRABEGRON ER 25 MG PO TB24
25.0000 mg | ORAL_TABLET | Freq: Every day | ORAL | Status: DC
  Administered 2022-10-21 – 2022-10-22 (×2): 25 mg via ORAL
  Filled 2022-10-20 (×2): qty 1

## 2022-10-20 MED ORDER — PREDNISONE 5 MG PO TABS
5.0000 mg | ORAL_TABLET | Freq: Every day | ORAL | Status: DC
  Administered 2022-10-21 – 2022-10-22 (×2): 5 mg via ORAL
  Filled 2022-10-20 (×2): qty 1

## 2022-10-20 MED ORDER — ONDANSETRON HCL 4 MG PO TABS: 4.0000 mg | ORAL_TABLET | Freq: Four times a day (QID) | ORAL | Status: DC | PRN

## 2022-10-20 MED ORDER — ALBUTEROL SULFATE (2.5 MG/3ML) 0.083% IN NEBU: 2.5000 mg | INHALATION_SOLUTION | RESPIRATORY_TRACT | Status: DC | PRN

## 2022-10-20 MED ORDER — FAMOTIDINE 20 MG PO TABS
10.0000 mg | ORAL_TABLET | Freq: Every day | ORAL | Status: DC
  Administered 2022-10-21 – 2022-10-22 (×2): 10 mg via ORAL
  Filled 2022-10-20 (×2): qty 1

## 2022-10-20 MED ORDER — TRAZODONE HCL 50 MG PO TABS
25.0000 mg | ORAL_TABLET | Freq: Every evening | ORAL | Status: DC | PRN
  Filled 2022-10-20: qty 1

## 2022-10-20 MED ORDER — SODIUM CHLORIDE 0.9 % IV SOLN
1.0000 g | Freq: Two times a day (BID) | INTRAVENOUS | Status: AC
  Administered 2022-10-21 (×3): 1 g via INTRAVENOUS
  Filled 2022-10-20 (×3): qty 20

## 2022-10-20 MED ORDER — ACETAMINOPHEN 650 MG RE SUPP: 650.0000 mg | Freq: Four times a day (QID) | RECTAL | Status: DC | PRN

## 2022-10-20 MED ORDER — RISAQUAD PO CAPS
1.0000 | ORAL_CAPSULE | Freq: Every day | ORAL | Status: DC
  Administered 2022-10-21 – 2022-10-22 (×2): 1 via ORAL
  Filled 2022-10-20 (×2): qty 1

## 2022-10-20 MED ORDER — ONDANSETRON HCL 4 MG/2ML IJ SOLN: 4.0000 mg | Freq: Four times a day (QID) | INTRAMUSCULAR | Status: DC | PRN

## 2022-10-20 MED ORDER — APIXABAN 5 MG PO TABS
5.0000 mg | ORAL_TABLET | Freq: Two times a day (BID) | ORAL | Status: DC
  Administered 2022-10-20 – 2022-10-22 (×4): 5 mg via ORAL
  Filled 2022-10-20 (×4): qty 1

## 2022-10-20 MED ORDER — SODIUM CHLORIDE 0.9 % IV SOLN
1.0000 g | INTRAVENOUS | Status: AC
  Administered 2022-10-20: 1 g via INTRAVENOUS
  Filled 2022-10-20: qty 20

## 2022-10-20 MED ORDER — ACETAMINOPHEN 325 MG PO TABS
650.0000 mg | ORAL_TABLET | Freq: Four times a day (QID) | ORAL | Status: DC | PRN
  Filled 2022-10-20: qty 2

## 2022-10-20 NOTE — Plan of Care (Signed)
  Problem: Respiratory: Goal: Complications related to the disease process, condition or treatment will be avoided or minimized Outcome: Progressing   Problem: Clinical Measurements: Goal: Will remain free from infection Outcome: Progressing Goal: Diagnostic test results will improve Outcome: Progressing   Problem: Nutrition: Goal: Adequate nutrition will be maintained Outcome: Progressing

## 2022-10-20 NOTE — ED Provider Notes (Signed)
[place] EMERGENCY DEPARTMENT AT [facility] Provider Note   CSN: [id number] Arrival date & time: 10/20/22  1119     History  Chief Complaint  Patient presents with   Dysuria    [name] is a 89 y.o. female with a past medical history significant for A-fib, history of chronic UTIs on chronic Macrobid, OSA presents to the ED due to dysuria for the past few weeks.  Patient was advised by her PCP to report to the ED for IV antibiotics due to a positive urine culture.  Patient recently on cefdinir which she completed and continued having urinary symptoms.  Patient was then placed on fosfomycin however, never started taking the antibiotic due to normal UA results however, had a urine culture on 9/20 which is positive for Klebsiella pneumoniae with multidrug resistant however, unable to see sensitivities. See results below.  No fever or chills.  Denies back pain.  No abdominal pain.  No history of kidney stones.  No hematuria. Denies vaginal discharge. No concern for STIs.   History obtained from patient and past medical records. No interpreter used during encounter.       Home Medications Prior to Admission medications   Medication Sig Start Date End Date Taking? Authorizing Provider  acetaminophen (TYLENOL) 325 MG tablet Take 2 tablets (650 mg total) by mouth every 6 (six) hours as needed for mild pain, fever or headache. 11/24/21   Almon Hercules, MD  albuterol (VENTOLIN HFA) 108 (90 Base) MCG/ACT inhaler Inhale 1-2 puffs into the lungs every 6 (six) hours as needed. 09/02/22   Parrett, Virgel Bouquet, NP  amLODipine (NORVASC) 2.5 MG tablet Take 1 tablet (2.5 mg total) by mouth daily. 02/25/21 07/27/22  Tereso Newcomer T, PA-C  cefdinir (OMNICEF) 300 MG capsule Take 300 mg by mouth 2 (two) times daily. Patient not taking: Reported on 10/19/2022 10/06/22   [provider]  Cholecalciferol (VITAMIN D3) 50 MCG (2000 UT) CAPS Take 2,000 Units by mouth at bedtime.    [provider]  ciprofloxacin-dexamethasone (CIPRODEX) OTIC suspension Place 4 drops into the left ear. 01/27/22   [provider]  Cranberry 400 MG TABS Take 1 tablet by mouth daily.    [provider]  ELIQUIS 5 MG TABS tablet TAKE 1 TABLET TWICE A DAY 02/09/22   Jake Bathe, MD  famotidine (PEPCID) 40 MG tablet Take 40 mg by mouth daily.    [provider]  fosfomycin (MONUROL) 3 g PACK Take by mouth. 10/14/22   [provider]  GEMTESA 75 MG TABS Take 75 mg by mouth daily.    [provider]  hydrocortisone (CORTEF) 5 MG tablet Take 1 tablet (5 mg total) by mouth as directed. 3 tablets in the morning and 1 tablet between 2-4 pm 10/19/22   Shamleffer, Konrad Dolores, MD  ipratropium-albuterol (DUONEB) 0.5-2.5 (3) MG/3ML SOLN Take 3 mLs by nebulization every 6 (six) hours as needed. 02/26/22   Oretha Milch, MD  Multiple Vitamins-Minerals (SENIOR VITES PO) Take 1 tablet by mouth daily with breakfast.    [provider]  ondansetron (ZOFRAN) 4 MG tablet Take 1 tablet (4 mg total) by mouth every 6 (six) hours as needed for nausea. 11/02/21   Marguerita Merles Latif, DO  Probiotic Product (PHILLIPS COLON HEALTH) CAPS Take 1 capsule by mouth daily.    [provider]  Spacer/Aero-Holding Chambers (AEROCHAMBER Z-STAT PLUS CHAMBR) MISC See admin instructions. 07/01/22   [provider]  trimethoprim (TRIMPEX) 100 MG tablet Take 100 mg by mouth daily. 09/04/22   [provider]      Allergies    Bee venom, Mixed vespid venom, Codeine, Sulfa antibiotics, Erythromycin, and Other    Review of Systems   Review of Systems  Constitutional:  Negative for chills and fever.  Gastrointestinal:  Negative for abdominal pain.  Genitourinary:  Positive for dysuria. Negative for flank pain and hematuria.    Physical Exam Updated Vital Signs BP (!) 148/72   Pulse 82   Temp 97.6 F (36.4 C) (Oral)   Resp 20   LMP  (LMP Unknown)   SpO2  97%  Physical Exam Vitals and nursing note reviewed.  Constitutional:      General: She is not in acute distress.    Appearance: She is not ill-appearing.  HENT:     Head: Normocephalic.  Eyes:     Pupils: Pupils are equal, round, and reactive to light.  Cardiovascular:     Rate and Rhythm: Normal rate and regular rhythm.     Pulses: Normal pulses.     Heart sounds: Normal heart sounds. No murmur heard.    No friction rub. No gallop.  Pulmonary:     Effort: Pulmonary effort is normal.     Breath sounds: Normal breath sounds.  Abdominal:     General: Abdomen is flat. There is no distension.     Palpations: Abdomen is soft.     Tenderness: There is no abdominal tenderness. There is no guarding or rebound.  Musculoskeletal:        General: Normal range of motion.     Cervical back: Neck supple.  Skin:    General: Skin is warm and dry.  Neurological:     General: No focal deficit present.     Mental Status: She is alert.  Psychiatric:        Mood and Affect: Mood normal.        Behavior: Behavior normal.     ED Results / Procedures / Treatments   Labs (all labs ordered are listed, but only abnormal results are displayed) Labs Reviewed  CBC WITH DIFFERENTIAL/PLATELET - Abnormal; Notable for the following components:      Result Value   Hemoglobin 15.2 (*)    HCT 46.8 (*)    All other components within normal limits  BASIC METABOLIC PANEL - Abnormal; Notable for the following components:   Glucose, Bld 108 (*)    Creatinine, Ser 1.36 (*)    GFR, Estimated 37 (*)    All other components within normal limits  CULTURE, BLOOD (ROUTINE X 2)  CULTURE, BLOOD (ROUTINE X 2)  URINE CULTURE  URINALYSIS, ROUTINE W REFLEX MICROSCOPIC  I-STAT CG4 LACTIC ACID, ED  I-STAT CG4 LACTIC ACID, ED    EKG None  Radiology No results found.  Procedures Procedures    Medications Ordered in ED Medications  meropenem (MERREM) 1 g in sodium chloride 0.9 % 100 mL IVPB (has no  administration in time range)  meropenem (MERREM) 1 g in sodium chloride 0.9 % 100 mL IVPB (has no administration in time range)    ED Course/ Medical Decision Making/ A&P Clinical Course as of 10/20/22 1354  Wed Oct 20, 2022  1312 Spoke to pharmacy who reviewed previous urine culture results. IV antibiotics started per pharmacy. Patient will require admission for IV antibiotics [CA]    Clinical Course User Index [CA] Mannie Stabile, PA-C  Medical Decision Making Amount and/or Complexity of Data Reviewed Independent Historian: caregiver    Details: Daughter provided some history at bedside External Data Reviewed: notes.    Details: PCP notes/ urine culture Labs: ordered. Decision-making details documented in ED Course.  Risk Decision regarding hospitalization.   This patient presents to the ED for concern of dysuria, this involves an extensive number of treatment options, and is a complaint that carries with it a high risk of complications and morbidity.  The differential diagnosis includes acute cystitis, pyelonephritis, sepsis, kidney stone, etc  89 year old female presents to the ED due to persistent dysuria for the past few weeks.  Has a history of chronic UTIs on daily Macrobid.  Patient was sent by PCP for IV antibiotics due to multi drug-resistant Klebsiella.  Recently finished cefdinir. Denies fever and chills.  No abdominal pain or back pain.  Upon arrival patient afebrile, not tachycardic or hypoxic.  Patient in no acute distress.  Abdomen soft, nondistended, nontender.  No CVA tenderness.  Routine labs ordered.  Urine culture results as noted above however, no sensitivities.  Discussed with pharmacy.  IV meropenem started per pharmacy recommendations after review of previous urine cultures.  Patient will require admission for IV antibiotics. Will hold off on CT abdomen due to no tenderness on exam.   Lactic acid normal.  BMP significant for  elevated creatinine 1.36 and GFR 37.  Mild hyperglycemia at 108.  No anion gap.  No major electrolyte derangements.  CBC reassuring.  No leukocytosis.  Hemoglobin at 15.2.  Will discuss with hospitalist for admission for IV antibiotics for complicated UTI. UA positive for leukocytes, rare bacteria, and >50 WBC.  2:29 PM Discussed with Dr. Erenest Blank with [facility] who agrees to admit patient.   Lives at home Hx chronic UTIs Has PCP       Final Clinical Impression(s) / ED Diagnoses Final diagnoses:  Acute cystitis with hematuria    Rx / DC Orders ED Discharge Orders     None         Jesusita Oka 10/20/22 1431    Lorre Nick, MD 10/23/22 [phone]

## 2022-10-20 NOTE — ED Notes (Signed)
ED TO INPATIENT HANDOFF REPORT  Name/Age/Gender [name] 89 y.o. female  Code Status    Code Status Orders  (From admission, onward)           Start     Ordered   10/20/22 1432  Full code  Continuous       Question:  By:  Answer:  Consent: discussion documented in EHR   10/20/22 1432           Code Status History     Date Active Date Inactive Code Status Order ID Comments User Context   11/19/2021 1803 11/28/2021 1248 Full Code [id number]  Almon Hercules, MD ED   11/10/2021 1724 11/17/2021 1704 Full Code [id number]  Rolly Salter, MD ED   10/30/2021 1408 11/02/2021 1817 Full Code [id number]  Bobette Mo, MD Inpatient   01/20/2020 0245 01/25/2020 1542 Full Code [id number]  Hillary Bow, DO ED   11/23/2019 1346 12/10/2019 1606 Full Code [id number]  Jacquelynn Cree, PA-C Inpatient   11/14/2019 0002 11/23/2019 1334 Full Code [id number]  Gordy Councilman, MD ED       Home/SNF/Other Home  Chief Complaint Infection due to ESBL-producing Klebsiella pneumoniae [A49.8, Z16.12]  Level of Care/Admitting Diagnosis ED Disposition     ED Disposition  Admit   Condition  --   Comment  Hospital Area: [facility] [[id number]]  Level of Care: Med-Surg [16]  May admit patient to [facility] or [facility] if equivalent level of care is available:: Yes  Covid Evaluation: Asymptomatic - no recent exposure (last 10 days) testing not required  Diagnosis: Infection due to ESBL-producing Klebsiella pneumoniae [536644]  Admitting Physician: Maryln Gottron [[id number]]  Attending Physician: Olexa.Dam, MIR Jaxson.Roy [[id number]]  Certification:: I certify this patient will need inpatient services for at least 2 midnights  Expected Medical Readiness: 10/23/2022          Medical History Past Medical History:  Diagnosis Date   Atrial fibrillation [facility])    Prior treatment with amiodarone, held sinus rhythm and drug stopped   Atrial flutter (HCC)    Status post  ablation prior to  2009   Chest pain    Nuclear, 2004, no scar or ischemia, EF 77%   Ejection fraction    EF 70%, nuclear,  2004  //      Fall    July, 2014   FUO (fever of unknown origin) 11/15/2021   H/O bladder repair surgery    OSA (obstructive sleep apnea)    Right Achilles tendinitis    Sciatica     Allergies Allergies  Allergen Reactions   Bee Venom Anaphylaxis   Mixed Vespid Venom Anaphylaxis   Codeine Other (See Comments)    Exact allergic reaction not known   Sulfa Antibiotics Itching and Nausea Only   Erythromycin Nausea And Vomiting   Other Itching, Nausea Only and Other (See Comments)    IV Location/Drains/Wounds Patient Lines/Drains/Airways Status     Active Line/Drains/Airways     Name Placement date Placement time Site Days   Peripheral IV 10/20/22 20 G Anterior;Left Forearm 10/20/22  1401  Forearm  less than 1   External Urinary Catheter 11/19/21  1458  --  335            Labs/Imaging Results for orders placed or performed during the hospital encounter of 10/20/22 (from the past 48 hour(s))  Blood culture (routine x 2)     Status: None (Preliminary result)  Collection Time: 10/20/22 12:30 PM   Specimen: BLOOD RIGHT FOREARM  Result Value Ref Range   Specimen Description      BLOOD RIGHT FOREARM Performed at [facility], [address], [place], [place] [address]    Special Requests      BOTTLES DRAWN AEROBIC ONLY Blood Culture adequate volume Performed at [facility], [address] [place], [place] [address]    Culture PENDING    Report Status PENDING   CBC with Differential     Status: Abnormal   Collection Time: 10/20/22 12:41 PM  Result Value Ref Range   WBC 8.8 4.0 - 10.5 K/uL   RBC 4.69 3.87 - 5.11 MIL/uL   Hemoglobin 15.2 (H) 12.0 - 15.0 g/dL   HCT 09.8 (H) 11.9 - 14.7 %   MCV 99.8 80.0 - 100.0 fL   MCH 32.4 26.0 - 34.0 pg   MCHC 32.5 30.0 - 36.0 g/dL   RDW 82.9 56.2 - 13.0 %   Platelets 264 150 -  400 K/uL   nRBC 0.0 0.0 - 0.2 %   Neutrophils Relative % 62 %   Neutro Abs 5.6 1.7 - 7.7 K/uL   Lymphocytes Relative 24 %   Lymphs Abs 2.1 0.7 - 4.0 K/uL   Monocytes Relative 10 %   Monocytes Absolute 0.9 0.1 - 1.0 K/uL   Eosinophils Relative 2 %   Eosinophils Absolute 0.2 0.0 - 0.5 K/uL   Basophils Relative 1 %   Basophils Absolute 0.1 0.0 - 0.1 K/uL   Immature Granulocytes 1 %   Abs Immature Granulocytes 0.04 0.00 - 0.07 K/uL    Comment: Performed at [facility], [address] [place], [place] [address]  Basic metabolic panel     Status: Abnormal   Collection Time: 10/20/22 12:41 PM  Result Value Ref Range   Sodium 138 135 - 145 mmol/L   Potassium 3.8 3.5 - 5.1 mmol/L   Chloride 104 98 - 111 mmol/L   CO2 25 22 - 32 mmol/L   Glucose, Bld 108 (H) 70 - 99 mg/dL    Comment: Glucose reference range applies only to samples taken after fasting for at least 8 hours.   BUN 23 8 - 23 mg/dL   Creatinine, Ser 4.69 (H) 0.44 - 1.00 mg/dL   Calcium 8.9 8.9 - 62.9 mg/dL   GFR, Estimated 37 (L) >60 mL/min    Comment: (NOTE) Calculated using the CKD-EPI Creatinine Equation (2021)    Anion gap 9 5 - 15    Comment: Performed at [facility], [address], [place], [place] [address]  I-Stat CG4 Lactic Acid     Status: None   Collection Time: 10/20/22  1:14 PM  Result Value Ref Range   Lactic Acid, Venous 1.3 0.5 - 1.9 mmol/L  Urinalysis, Routine w reflex microscopic -Urine, Clean Catch     Status: Abnormal   Collection Time: 10/20/22  2:12 PM  Result Value Ref Range   Color, Urine AMBER (A) YELLOW    Comment: BIOCHEMICALS MAY BE AFFECTED BY COLOR   APPearance CLOUDY (A) CLEAR   Specific Gravity, Urine 1.021 1.005 - 1.030   pH 5.0 5.0 - 8.0   Glucose, UA NEGATIVE NEGATIVE mg/dL   Hgb urine dipstick NEGATIVE NEGATIVE   Bilirubin Urine NEGATIVE NEGATIVE   Ketones, ur NEGATIVE NEGATIVE mg/dL   Protein, ur 324 (A) NEGATIVE mg/dL   Nitrite NEGATIVE  NEGATIVE   Leukocytes,Ua LARGE (A)  NEGATIVE   RBC / HPF 21-50 0 - 5 RBC/hpf   WBC, UA >50 0 - 5 WBC/hpf   Bacteria, UA RARE (A) NONE SEEN   Squamous Epithelial / HPF 6-10 0 - 5 /HPF   WBC Clumps PRESENT    Mucus PRESENT    Hyaline Casts, UA PRESENT    Ca Oxalate Crys, UA PRESENT     Comment: Performed at [facility], [address] [place], [place] [address]   No results found.  Pending Labs Unresulted Labs (From admission, onward)     Start     Ordered   10/21/22 0500  Basic metabolic panel  Tomorrow morning,   R        10/20/22 1432   10/21/22 0500  CBC  Tomorrow morning,   R        10/20/22 1432   10/20/22 1207  Blood culture (routine x 2)  BLOOD CULTURE X 2,   R      10/20/22 1206   10/20/22 1207  Urine Culture  Once,   URGENT       Question:  Indication  Answer:  Dysuria   10/20/22 1206            Vitals/Pain Today's Vitals   10/20/22 1133 10/20/22 1136  BP: (!) 148/72   Pulse: 82   Resp: 20   Temp: 97.6 F (36.4 C)   TempSrc: Oral   SpO2: 97%   PainSc:  10-Worst pain ever    Isolation Precautions No active isolations  Medications Medications  meropenem (MERREM) 1 g in sodium chloride 0.9 % 100 mL IVPB (has no administration in time range)  famotidine (PEPCID) tablet 40 mg (has no administration in time range)  [employer] CAPS 1 capsule (has no administration in time range)  mirabegron ER (MYRBETRIQ) tablet 25 mg (has no administration in time range)  apixaban (ELIQUIS) tablet 5 mg (has no administration in time range)  acetaminophen (TYLENOL) tablet 650 mg (has no administration in time range)    Or  acetaminophen (TYLENOL) suppository 650 mg (has no administration in time range)  traZODone (DESYREL) tablet 25 mg (has no administration in time range)  ondansetron (ZOFRAN) tablet 4 mg (has no administration in time range)    Or  ondansetron (ZOFRAN) injection 4 mg (has no administration in time range)  albuterol  (PROVENTIL) (2.5 MG/3ML) 0.083% nebulizer solution 2.5 mg (has no administration in time range)  predniSONE (DELTASONE) tablet 5 mg (has no administration in time range)  meropenem (MERREM) 1 g in sodium chloride 0.9 % 100 mL IVPB (0 g Intravenous Stopped 10/20/22 1506)    Mobility walks

## 2022-10-20 NOTE — H&P (Signed)
History and Physical  [name] ZOX:[id number] DOB: May 14, 1931 DOA: 10/20/2022  PCP: Rodrigo Ran, MD   Chief Complaint: Dysuria  HPI: [name] is a 89 y.o. female with medical history significant for atrial fibrillation on Eliquis, chronic UTIs and history of ESBL Klebsiella in 2021 being admitted to the hospital with recurrent UTI that failed outpatient management.  She started having symptoms including dysuria a couple weeks ago, never had any nausea, vomiting, fevers or chills.  Was started on a course of cefdinir by her PCP which she completed and continued having symptoms of dysuria.  She chronically has urinary incontinence, but not dysuria symptoms.  She also has a history of adrenal insufficiency, saw endocrinology yesterday in consultation and they have plans to switch her to hydrocortisone however discussed staying on prednisone until after her grandsons wedding this weekend.  Today her PCP told her to come to the emergency department for IV antibiotics, as her urine culture grew Klebsiella.  ED Course: Vital signs in the emergency department have been unremarkable.  Lab work was done demonstrates unremarkable CBC, BMP is indicative of acute renal failure, lactic acid.  Urinalysis and urine culture were obtained, patient was started on IV meropenem empirically.  Review of Systems: Please see HPI for pertinent positives and negatives. A complete 10 system review of systems are otherwise negative.  Past Medical History:  Diagnosis Date   Atrial fibrillation [facility])    Prior treatment with amiodarone, held sinus rhythm and drug stopped   Atrial flutter (HCC)    Status post ablation prior to  2009   Chest pain    Nuclear, 2004, no scar or ischemia, EF 77%   Ejection fraction    EF 70%, nuclear,  2004  //      Fall    July, 2014   FUO (fever of unknown origin) 11/15/2021   H/O bladder repair surgery    OSA (obstructive sleep apnea)    Right Achilles tendinitis    Sciatica     Past Surgical History:  Procedure Laterality Date   IR CT HEAD LTD  11/14/2019   IR PERCUTANEOUS ART THROMBECTOMY/INFUSION INTRACRANIAL INC DIAG ANGIO  11/14/2019   IR RADIOLOGIST EVAL & MGMT  02/20/2020   IR US GUIDE VASC ACCESS RIGHT  11/14/2019   no surgical hx     RADIOLOGY WITH ANESTHESIA N/A 11/13/2019   Procedure: IR WITH ANESTHESIA;  Surgeon: Julieanne Cotton, MD;  Location: [facility] OR;  Service: Radiology;  Laterality: N/A;    Social History:  reports that she has quit smoking. Her smoking use included cigarettes. She has a 10 pack-year smoking history. She has never used smokeless tobacco. She reports current alcohol use. She reports that she does not use drugs.   Allergies  Allergen Reactions   Bee Venom Anaphylaxis   Mixed Vespid Venom Anaphylaxis   Codeine Other (See Comments)    Exact allergic reaction not known   Sulfa Antibiotics Itching and Nausea Only   Erythromycin Nausea And Vomiting   Other Itching, Nausea Only and Other (See Comments)    Family History  Problem Relation Age of Onset   Cancer Mother    Cancer Father        pancreatic cancer   Heart disease Brother      Prior to Admission medications   Medication Sig Start Date End Date Taking? Authorizing Provider  acetaminophen (TYLENOL) 325 MG tablet Take 2 tablets (650 mg total) by mouth every 6 (six) hours as needed  for mild pain, fever or headache. 11/24/21   Almon Hercules, MD  albuterol (VENTOLIN HFA) 108 (90 Base) MCG/ACT inhaler Inhale 1-2 puffs into the lungs every 6 (six) hours as needed. 09/02/22   Parrett, Virgel Bouquet, NP  amLODipine (NORVASC) 2.5 MG tablet Take 1 tablet (2.5 mg total) by mouth daily. 02/25/21 07/27/22  Tereso Newcomer T, PA-C  cefdinir (OMNICEF) 300 MG capsule Take 300 mg by mouth 2 (two) times daily. Patient not taking: Reported on 10/19/2022 10/06/22   [provider]  Cholecalciferol (VITAMIN D3) 50 MCG (2000 UT) CAPS Take 2,000 Units by mouth at bedtime.    [provider]  ciprofloxacin-dexamethasone (CIPRODEX) OTIC suspension Place 4 drops into the left ear. 01/27/22   [provider]  Cranberry 400 MG TABS Take 1 tablet by mouth daily.    [provider]  ELIQUIS 5 MG TABS tablet TAKE 1 TABLET TWICE A DAY 02/09/22   Jake Bathe, MD  famotidine (PEPCID) 40 MG tablet Take 40 mg by mouth daily.    [provider]  fosfomycin (MONUROL) 3 g PACK Take by mouth. 10/14/22   [provider]  GEMTESA 75 MG TABS Take 75 mg by mouth daily.    [provider]  hydrocortisone (CORTEF) 5 MG tablet Take 1 tablet (5 mg total) by mouth as directed. 3 tablets in the morning and 1 tablet between 2-4 pm 10/19/22   Shamleffer, Konrad Dolores, MD  ipratropium-albuterol (DUONEB) 0.5-2.5 (3) MG/3ML SOLN Take 3 mLs by nebulization every 6 (six) hours as needed. 02/26/22   Oretha Milch, MD  Multiple Vitamins-Minerals (SENIOR VITES PO) Take 1 tablet by mouth daily with breakfast.    [provider]  ondansetron (ZOFRAN) 4 MG tablet Take 1 tablet (4 mg total) by mouth every 6 (six) hours as needed for nausea. 11/02/21   Marguerita Merles Latif, DO  Probiotic Product (PHILLIPS COLON HEALTH) CAPS Take 1 capsule by mouth daily.    [provider]  Spacer/Aero-Holding Chambers (AEROCHAMBER Z-STAT PLUS CHAMBR) MISC See admin instructions. 07/01/22   [provider]  trimethoprim (TRIMPEX) 100 MG tablet Take 100 mg by mouth daily. 09/04/22   [provider]    Physical Exam: BP (!) 148/72   Pulse 82   Temp 97.6 F (36.4 C) (Oral)   Resp 20   LMP  (LMP Unknown)   SpO2 97%   General:  Alert, oriented, calm, in no acute distress, looks very comfortable and younger than her stated age, her daughter is at the bedside Eyes: EOMI, clear conjuctivae, white sclerea Neck: supple, no masses, trachea mildline  Cardiovascular: RRR, no murmurs or rubs, no peripheral edema  Respiratory: clear to auscultation  bilaterally, no wheezes, no crackles  Abdomen: soft, nontender, nondistended, normal bowel tones heard  Skin: dry, no rashes  Musculoskeletal: no joint effusions, normal range of motion  Psychiatric: appropriate affect, normal speech  Neurologic: extraocular muscles intact, clear speech, moving all extremities with intact sensorium         Labs on Admission:  Basic Metabolic Panel: Recent Labs  Lab 10/19/22 1449 10/20/22 1241  NA 141 138  K 4.7 3.8  CL 104 104  CO2 26 25  GLUCOSE 106* 108*  BUN 16 23  CREATININE 1.27* 1.36*  CALCIUM 9.7 8.9   Liver Function Tests: No results for input(s): "AST", "ALT", "ALKPHOS", "BILITOT", "PROT", "ALBUMIN" in the last 168 hours. No results for input(s): "LIPASE", "AMYLASE" in the last 168 hours.  No results for input(s): "AMMONIA" in the last 168 hours. CBC: Recent Labs  Lab 10/20/22 1241  WBC 8.8  NEUTROABS 5.6  HGB 15.2*  HCT 46.8*  MCV 99.8  PLT 264   Cardiac Enzymes: No results for input(s): "CKTOTAL", "CKMB", "CKMBINDEX", "TROPONINI" in the last 168 hours.  BNP (last 3 results) No results for input(s): "BNP" in the last 8760 hours.  ProBNP (last 3 results) No results for input(s): "PROBNP" in the last 8760 hours.  CBG: No results for input(s): "GLUCAP" in the last 168 hours.  Radiological Exams on Admission: No results found.  Assessment/Plan [name] is a 89 y.o. female with medical history significant for atrial fibrillation on Eliquis, chronic UTIs and history of ESBL Klebsiella in 2021 being admitted to the hospital with recurrent UTI that failed outpatient management.   Klebsiella UTI-with history of ESBL Klebsiella in 2021, failed outpatient management with cefdinir likely due to antibiotic resistance. -Inpatient admission -Empiric IV meropenem -Follow-up urine culture  Chronic atrial fibrillation-continue Eliquis  Acute renal failure-in the setting of urinary tract infection, with baseline normal renal  function -Avoid nephrotoxins -Follow renal function with daily labs -Will check renal ultrasound due to complaints of incontinence, to ensure no obstruction with overflow incontinence, etc.  GERD-famotidine  Adrenal insufficiency-continue chronic prednisone 5 mg p.o. daily, will consider stress dose if any evidence of adrenal crisis  DVT prophylaxis: Eliquis    Code Status: Full Code  Consults called: None  Admission status: The appropriate patient status for this patient is INPATIENT. Inpatient status is judged to be reasonable and necessary in order to provide the required intensity of service to ensure the patient's safety. The patient's presenting symptoms, physical exam findings, and initial radiographic and laboratory data in the context of their chronic comorbidities is felt to place them at high risk for further clinical deterioration. Furthermore, it is not anticipated that the patient will be medically stable for discharge from the hospital within 2 midnights of admission.    I certify that at the point of admission it is my clinical judgment that the patient will require inpatient hospital care spanning beyond 2 midnights from the point of admission due to high intensity of service, high risk for further deterioration and high frequency of surveillance required  Time spent: 59 minutes  Shelton Soler Sharlette Dense MD [facility] Pager [phone]  If 7PM-7AM, please contact night-coverage [email or web address] Password [facility]  10/20/2022, 2:54 PM

## 2022-10-20 NOTE — ED Triage Notes (Addendum)
Patient presents due to a UTI that will not go away. MD sent her here for IV antibiotics. She complains of a burning sensation with urination. She has been on antibiotics for 2 weeks.

## 2022-10-21 ENCOUNTER — Other Ambulatory Visit (HOSPITAL_COMMUNITY): Payer: Self-pay

## 2022-10-21 DIAGNOSIS — Z1612 Extended spectrum beta lactamase (ESBL) resistance: Secondary | ICD-10-CM | POA: Diagnosis not present

## 2022-10-21 DIAGNOSIS — A498 Other bacterial infections of unspecified site: Secondary | ICD-10-CM | POA: Diagnosis not present

## 2022-10-21 LAB — CBC
HCT: 40.6 % (ref 36.0–46.0)
Hemoglobin: 13.2 g/dL (ref 12.0–15.0)
MCH: 32.3 pg (ref 26.0–34.0)
MCHC: 32.5 g/dL (ref 30.0–36.0)
MCV: 99.3 fL (ref 80.0–100.0)
Platelets: 220 10*3/uL (ref 150–400)
RBC: 4.09 MIL/uL (ref 3.87–5.11)
RDW: 12.8 % (ref 11.5–15.5)
WBC: 8 10*3/uL (ref 4.0–10.5)
nRBC: 0 % (ref 0.0–0.2)

## 2022-10-21 LAB — URINE CULTURE: Culture: 30000 — AB

## 2022-10-21 LAB — BASIC METABOLIC PANEL
Anion gap: 8 (ref 5–15)
BUN: 27 mg/dL — ABNORMAL HIGH (ref 8–23)
CO2: 25 mmol/L (ref 22–32)
Calcium: 8.6 mg/dL — ABNORMAL LOW (ref 8.9–10.3)
Chloride: 106 mmol/L (ref 98–111)
Creatinine, Ser: 1.03 mg/dL — ABNORMAL HIGH (ref 0.44–1.00)
GFR, Estimated: 52 mL/min — ABNORMAL LOW (ref >60)
Glucose, Bld: 88 mg/dL (ref 70–99)
Potassium: 3.8 mmol/L (ref 3.5–5.1)
Sodium: 139 mmol/L (ref 135–145)

## 2022-10-21 LAB — CULTURE, BLOOD (ROUTINE X 2)
Special Requests: ADEQUATE
Special Requests: ADEQUATE

## 2022-10-21 MED ORDER — FOSFOMYCIN TROMETHAMINE 3 G PO PACK
3.0000 g | PACK | Freq: Once | ORAL | Status: AC
  Administered 2022-10-22: 3 g via ORAL
  Filled 2022-10-21: qty 3

## 2022-10-21 MED ORDER — FOSFOMYCIN TROMETHAMINE 3 G PO PACK
3.0000 g | PACK | Freq: Once | ORAL | 0 refills | Status: AC
  Filled 2022-10-21: qty 3, 1d supply, fill #0

## 2022-10-21 NOTE — Hospital Course (Addendum)
89yo with h/o afib on Eliquis, chronic UTIs with h/o ESBL Klebsiella in 2021 who presented on 9/25 with UTI that failed outpatient management with Cefdinir.  Culture results came back as MDR Klebsiella (trying to obtain sensitivities now).  She was started on Meropenem with marked improvement.  After discussion with ID and pharmacy, the plan is to give 2 doses of PO fosfomycin and she is appropriate for dc.

## 2022-10-21 NOTE — TOC Benefit Eligibility Note (Signed)
Pharmacy Patient Advocate Encounter  Insurance verification completed.    The patient is insured through [employer] test claim for Fosfomycin. Currently a quantity of 1 is a 1 day supply and the co-pay is $10.79 .   This test claim was processed through [facility]- copay amounts may vary at other pharmacies due to pharmacy/plan contracts, or as the patient moves through the different stages of their insurance plan.

## 2022-10-21 NOTE — Plan of Care (Signed)
  Problem: Education: Goal: Knowledge of risk factors and measures for prevention of condition will improve Outcome: Progressing   Problem: Clinical Measurements: Goal: Will remain free from infection Outcome: Progressing   Problem: Nutrition: Goal: Adequate nutrition will be maintained Outcome: Progressing   Problem: Elimination: Goal: Will not experience complications related to bowel motility Outcome: Progressing   Problem: Pain Managment: Goal: General experience of comfort will improve Outcome: Progressing   Problem: Safety: Goal: Ability to remain free from injury will improve Outcome: Progressing

## 2022-10-21 NOTE — Progress Notes (Addendum)
Progress Note   Patient: MYLAN TILLER NWG:[id number] DOB: January 10, 1932 DOA: 10/20/2022     1 DOS: the patient was seen and examined on 10/21/2022   Brief hospital course: 89yo with h/o afib on Eliquis, chronic UTIs with h/o ESBL Klebsiella in 2021 who presented on 9/25 with UTI that failed outpatient management with Cefdinir.  Culture results came back as MDR Klebsiella (trying to obtain sensitivities now).  She was started on Meropenem with marked improvement.  Assessment and Plan:  Klebsiella UTI With history of ESBL Klebsiella in 2021, likely failed outpatient management with cefdinir this time due to antibiotic resistance. Admitted to med surg Empiric IV meropenem Follow-up repeat urine culture here Attempting to get sensitivities report from [facility]  9/11 - Klebsiella - sensitive to Ertapenem, Gent, Meropenem, Imipenem There are also intermediate sensitivities including to Levaquin Current plan is to give another 2 doses of Ertapenem (tonight and first thing tomorrow AM) and then give 2-3 additional doses of PO Levaquin as an outpatient (assuming normal QT interval) - as per ID pharmacy She feels well - when able to coordinate outpatient care will be able to dc   AKI In the setting of urinary tract infection, with baseline normal renal function Improving with IVF Avoid nephrotoxins Follow renal function with daily labs Consider outpatient renal US but this does not appear to be required at this time given improvement overnight   Chronic atrial fibrillation Continue Eliquis  GERD Continue famotidine   Adrenal insufficiency Continue chronic prednisone 5 mg p.o. daily, will consider stress dose if any evidence of adrenal crisis     Consultants: ID (telephone only) Pharmacy [facility] team  Procedures: None  Antibiotics: Meropenem 9/25-  30 Day Unplanned Readmission Risk Score    Flowsheet Row ED to Hosp-Admission (Current) from 10/20/2022 in [facility]  30 Day Unplanned Readmission Risk Score (%) 17.72 Filed at 10/21/2022 0801       This score is the patient's risk of an unplanned readmission within 30 days of being discharged (0 -100%). The score is based on dignosis, age, lab data, medications, orders, and past utilization.   Low:  0-14.9   Medium: 15-21.9   High: 22-29.9   Extreme: 30 and above           Subjective: She feels great today!  Her first grandson is getting married Saturday and the rehearsal is tomorrow in [place] - she really wants to arrange for outpatient treatment and go home.   Objective: Vitals:   10/21/22 0452 10/21/22 1005  BP: (!) 150/76 131/72  Pulse: 62 (!) 58  Resp: 18 17  Temp: 97.6 F (36.4 C) 97.7 F (36.5 C)  SpO2: 99% 97%    Intake/Output Summary (Last 24 hours) at 10/21/2022 1221 Last data filed at 10/21/2022 1022 Gross per 24 hour  Intake 632 ml  Output --  Net 632 ml   Filed Weights   10/21/22 0800  Weight: 69.9 kg    Exam:  General:  Appears calm and comfortable and is in NAD Eyes:  EOMI, normal lids, iris ENT:  grossly normal hearing, lips & tongue, mmm Neck:  no LAD, masses or thyromegaly Cardiovascular:  RRR, no m/r/g. No LE edema.  Respiratory:   CTA bilaterally with no wheezes/rales/rhonchi.  Normal respiratory effort. Abdomen:  soft, NT, ND Skin:  no rash or induration seen on limited exam Musculoskeletal:  grossly normal tone BUE/BLE, good ROM, no bony abnormality Psychiatric:  grossly normal mood and affect, speech fluent and appropriate, AOx3 Neurologic:  CN 2-12 grossly intact, moves all extremities in coordinated fashion  Data Reviewed: I have reviewed the patient's lab results since admission.  Pertinent labs for today include:   BUN 27/Creatinine 1.03/GFR 52; 23/1.36/37 on 9/25 - normal baseline Normal CBC UA: large LE, 100 protein, rare bacteria Blood and urine cultures pending    Family Communication: None  present; we called and spoke with her daughter by telephone during the evaluation  Disposition: Status is: Inpatient Remains inpatient appropriate because: needs IV antibiotic therapy     Time spent: 50 minutes  Unresulted Labs (From admission, onward)    None        Author: Jonah Blue, MD 10/21/2022 12:21 PM  For on call review [email or web address].

## 2022-10-22 ENCOUNTER — Other Ambulatory Visit (HOSPITAL_COMMUNITY): Payer: Self-pay

## 2022-10-22 DIAGNOSIS — A498 Other bacterial infections of unspecified site: Secondary | ICD-10-CM | POA: Diagnosis not present

## 2022-10-22 DIAGNOSIS — Z1612 Extended spectrum beta lactamase (ESBL) resistance: Secondary | ICD-10-CM | POA: Diagnosis not present

## 2022-10-22 DIAGNOSIS — E2740 Unspecified adrenocortical insufficiency: Secondary | ICD-10-CM | POA: Diagnosis present

## 2022-10-22 DIAGNOSIS — N179 Acute kidney failure, unspecified: Secondary | ICD-10-CM | POA: Diagnosis present

## 2022-10-22 LAB — CBC WITH DIFFERENTIAL/PLATELET
Abs Immature Granulocytes: 0.04 10*3/uL (ref 0.00–0.07)
Basophils Absolute: 0 10*3/uL (ref 0.0–0.1)
Basophils Relative: 1 %
Eosinophils Absolute: 0.1 10*3/uL (ref 0.0–0.5)
Eosinophils Relative: 2 %
HCT: 42.9 % (ref 36.0–46.0)
Hemoglobin: 14.1 g/dL (ref 12.0–15.0)
Immature Granulocytes: 1 %
Lymphocytes Relative: 28 %
Lymphs Abs: 1.7 10*3/uL (ref 0.7–4.0)
MCH: 32 pg (ref 26.0–34.0)
MCHC: 32.9 g/dL (ref 30.0–36.0)
MCV: 97.3 fL (ref 80.0–100.0)
Monocytes Absolute: 0.8 10*3/uL (ref 0.1–1.0)
Monocytes Relative: 12 %
Neutro Abs: 3.5 10*3/uL (ref 1.7–7.7)
Neutrophils Relative %: 56 %
Platelets: 224 10*3/uL (ref 150–400)
RBC: 4.41 MIL/uL (ref 3.87–5.11)
RDW: 12.9 % (ref 11.5–15.5)
WBC: 6.1 10*3/uL (ref 4.0–10.5)
nRBC: 0 % (ref 0.0–0.2)

## 2022-10-22 LAB — URINE CULTURE

## 2022-10-22 LAB — ACTH: C206 ACTH: <5 pg/mL — ABNORMAL LOW (ref 6–50)

## 2022-10-22 NOTE — Progress Notes (Signed)
   10/22/22 0923  TOC Brief Assessment  Insurance and Status Reviewed  Patient has primary care physician Yes  Home environment has been reviewed Home  Prior level of function: Independent/modified independent  Prior/Current Home Services No current home services  Social Determinants of Health Reivew SDOH reviewed no interventions necessary  Readmission risk has been reviewed Yes  Transition of care needs no transition of care needs at this time

## 2022-10-22 NOTE — Plan of Care (Signed)

## 2022-10-22 NOTE — Discharge Summary (Signed)
Physician Discharge Summary   Patient: [name] MRN: [id number] DOB: [birth year]  Admit date:     10/20/2022  Discharge date: 10/22/22  Discharge Physician: Jonah Blue   PCP: Rodrigo Ran, MD   Recommendations at discharge:   Take 1 additional dose of Fosfomycin (antibiotic, provided) on Sunday, 9/29; mix packet with 3-4 oz of cold water and drink until gone Follow up with Dr. Waynard Edwards in 1-2 weeks or sooner if urinary symptoms recur  Discharge Diagnoses: Principal Problem:   Infection due to ESBL-producing Klebsiella pneumoniae Active Problems:   PAF (paroxysmal atrial fibrillation) (HCC)   Essential hypertension   Emphysema of lung (HCC)   AKI (acute kidney injury) [facility])   Adrenal insufficiency [facility])    Hospital Course: 89yo with h/o afib on Eliquis, chronic UTIs with h/o ESBL Klebsiella in 2021 who presented on 9/25 with UTI that failed outpatient management with Cefdinir.  Culture results came back as MDR Klebsiella (trying to obtain sensitivities now).  She was started on Meropenem with marked improvement.  After discussion with ID and pharmacy, the plan is to give 2 doses of PO fosfomycin and she is appropriate for dc.  Assessment and Plan:  Klebsiella UTI With history of ESBL Klebsiella in 2021, likely failed outpatient management with cefdinir this time due to antibiotic resistance. Admitted to med surg [facility] IV meropenem Follow-up repeat urine culture here - growing 30k colonies of similar Klebsiella (ESBL) Sensitivities report from [facility] from 9/11 shows ESBL Klebsiella - sensitive to Ertapenem, Gent, Meropenem, Imipenem There are also intermediate sensitivities including to Levaquin Discussed at length with ID/pharmacy yesterday and plan is for 2 doses of PO Fosfomycin, 1st dose now and second dose as an outpatient She feels well - will dc to home today   AKI In the setting of urinary tract infection, with baseline normal renal  function Improving with IVF Avoid nephrotoxins Improving, f/u outpatient Consider outpatient renal US if worsening again   Chronic atrial fibrillation Continue Eliquis  HTN Continue amlodipine   GERD Continue famotidine  OAB Continue Gemtesa  COPD Continue Duoneb and Albuterol   Adrenal insufficiency Continue chronic prednisone 5 mg p.o. daily No evidence of adrenal crisis so she was not given stress-dosed steroids Change to hydrocortisone when appropriate, as directed by PCP/endocrinology       Consultants: ID (telephone only) Pharmacy TOC team   Procedures: None   Antibiotics: Meropenem x 3 doses Fosfomycin x 1 dose    30 Day Unplanned Readmission Risk Score    Flowsheet Row ED to Hosp-Admission (Current) from [facility]/20/20[facility]  30 Day Unplanned Readmission Risk Score (%) 20.78 Filed at 10/22/2022 0401       This score is the patient's risk of an unplanned readmission within 30 days of being discharged (0 -100%). The score is based on dignosis, age, lab data, medications, orders, and past utilization.   Low:  0-14.9   Medium: 15-21.9   High: 22-29.9   Extreme: 30 and above         Disposition: Home Diet recommendation:  Regular diet DISCHARGE MEDICATION: Allergies as of 10/22/2022       Reactions   Bee Venom Anaphylaxis   Mixed Vespid Venom Anaphylaxis   Wasp Venom Anaphylaxis   "White-faced" wasps   Yellow Jacket Venom Anaphylaxis   Codeine Nausea Only, Other (See Comments)   Felt faint, also   Sulfa Antibiotics Itching, Nausea Only   Erythromycin Nausea And  Vomiting        Medication List     STOP taking these medications    ondansetron 4 MG tablet Commonly known as: ZOFRAN       TAKE these medications    AeroChamber Z-Stat Plus Chambr Misc See admin instructions.   albuterol 108 (90 Base) MCG/ACT inhaler Commonly known as: VENTOLIN HFA Inhale 1-2 puffs into the lungs every 6  (six) hours as needed. What changed: reasons to take this   amLODipine 2.5 MG tablet Commonly known as: NORVASC Take 1 tablet (2.5 mg total) by mouth daily.   CRANBERRY PO Take 650 mg by mouth in the morning.   Eliquis 5 MG Tabs tablet Generic drug: apixaban TAKE 1 TABLET TWICE A DAY What changed: when to take this   famotidine 40 MG tablet Commonly known as: PEPCID Take 40 mg by mouth daily before supper.   fosfomycin 3 g Pack Commonly known as: MONUROL Take 3 g (1 packet) by mouth once for 1 dose. Mix with 3-4 oz of cold water - take on Sun, 9/29 x 1 dose Start taking on: October 24, 2022   Gemtesa 75 MG Tabs Generic drug: Vibegron Take 75 mg by mouth at bedtime.   hydrocortisone 5 MG tablet Commonly known as: CORTEF Take 1 tablet (5 mg total) by mouth as directed. 3 tablets in the morning and 1 tablet between 2-4 pm   ipratropium-albuterol 0.5-2.5 (3) MG/3ML Soln Commonly known as: DUONEB Take 3 mLs by nebulization every 6 (six) hours as needed. What changed: when to take this   [facility] Colon Health Caps Take 1 capsule by mouth every evening.   predniSONE 5 MG tablet Commonly known as: DELTASONE Take 5 mg by mouth daily with breakfast.   SENIOR VITES PO Take 1 tablet by mouth daily with breakfast.   trimethoprim 100 MG tablet Commonly known as: TRIMPEX Take 100 mg by mouth daily.   vitamin D3 50 MCG (2000 UT) Caps Take 2,000 Units by mouth in the morning.        Discharge Exam: Filed Weights   10/21/22 0800  Weight: 69.9 kg     Subjective: Feeling tired, which she reports is baseline.  Otherwise, no complaints.  No dysuria, wants to go home.   Objective: Vitals:   10/21/22 2200 10/22/22 0559  BP: (!) 147/81 (!) 148/86  Pulse: 70 73  Resp: 17 18  Temp: (!) 97.5 F (36.4 C) 97.7 F (36.5 C)  SpO2: 98% 97%    Intake/Output Summary (Last 24 hours) at 10/22/2022 0916 Last data filed at 10/22/2022 0600 Gross per 24 hour  Intake 976 ml   Output 0 ml  Net 976 ml   Filed Weights   10/21/22 0800  Weight: 69.9 kg    Exam:  General:  Appears calm and comfortable and is in NAD Eyes:  EOMI, normal lids, iris ENT:  grossly normal hearing, lips & tongue, mmm Neck:  no LAD, masses or thyromegaly Cardiovascular:  RRR, no m/r/g. No LE edema.  Respiratory:   CTA bilaterally with no wheezes/rales/rhonchi.  Normal respiratory effort. Abdomen:  soft, NT, ND Skin:  no rash or induration seen on limited exam Musculoskeletal:  grossly normal tone BUE/BLE, good ROM, no bony abnormality Psychiatric:  grossly normal mood and affect, speech fluent and appropriate, AOx3 Neurologic:  CN 2-12 grossly intact, moves all extremities in coordinated fashion  Data Reviewed: I have reviewed the patient's lab results since admission.  Pertinent labs for today include:  Normal CBC   Condition at discharge: stable  The results of significant diagnostics from this hospitalization (including imaging, microbiology, ancillary and laboratory) are listed below for reference.   Imaging Studies: No results found.  Microbiology: Results for orders placed or performed during the hospital encounter of 10/20/22  Blood culture (routine x 2)     Status: None (Preliminary result)   Collection Time: 10/20/22 12:30 PM   Specimen: BLOOD  Result Value Ref Range Status   Specimen Description   Final    BLOOD RIGHT ANTECUBITAL Performed at [facility], [address], [place], [place] [address]    Special Requests   Final    BOTTLES DRAWN AEROBIC AND ANAEROBIC Blood Culture adequate volume Performed at [facility], [address], [place], [place] [address]    Culture   Final    NO GROWTH 2 DAYS Performed at [facility], [address], [place], [place] [address]    Report Status PENDING  Incomplete  Blood culture (routine x 2)     Status: None (Preliminary result)   Collection Time: 10/20/22 12:30 PM    Specimen: BLOOD RIGHT FOREARM  Result Value Ref Range Status   Specimen Description   Final    BLOOD RIGHT FOREARM Performed at [facility], [address], [place], [place] [address]    Special Requests   Final    BOTTLES DRAWN AEROBIC ONLY Blood Culture adequate volume Performed at [facility], [address] [place], [place] [address]    Culture   Final    NO GROWTH 2 DAYS Performed at [facility], [address], [place], [place] [address]    Report Status PENDING  Incomplete  Urine Culture     Status: Abnormal   Collection Time: 10/20/22  2:13 PM   Specimen: Urine, Clean Catch  Result Value Ref Range Status   Specimen Description   Final    URINE, CLEAN CATCH Performed at [facility], [address], [place], [place] [address]    Special Requests   Final    NONE Performed at [facility], [address], [place], [place] [address]    Culture (A)  Final    30,000 COLONIES/mL KLEBSIELLA PNEUMONIAE Confirmed Extended Spectrum Beta-Lactamase Producer (ESBL).  In bloodstream infections from ESBL organisms, carbapenems are preferred over piperacillin/tazobactam. They are shown to have a lower risk of mortality.    Report Status 10/22/2022 FINAL  Final   Organism ID, Bacteria KLEBSIELLA PNEUMONIAE (A)  Final      Susceptibility   Klebsiella pneumoniae - MIC*    AMPICILLIN >=32 RESISTANT Resistant     CEFAZOLIN >=64 RESISTANT Resistant     CEFEPIME >=32 RESISTANT Resistant     CEFTRIAXONE >=64 RESISTANT Resistant     CIPROFLOXACIN 2 RESISTANT Resistant     GENTAMICIN <=1 SENSITIVE Sensitive     IMIPENEM <=0.25 SENSITIVE Sensitive     NITROFURANTOIN 128 RESISTANT Resistant     TRIMETH/SULFA >=320 RESISTANT Resistant     AMPICILLIN/SULBACTAM >=32 RESISTANT Resistant     PIP/TAZO 64 INTERMEDIATE Intermediate     * 30,000 COLONIES/mL KLEBSIELLA PNEUMONIAE    Labs: CBC: Recent Labs  Lab  10/20/22 1241 10/21/22 0500 10/22/22 0830  WBC 8.8 8.0 6.1  NEUTROABS 5.6  --  3.5  HGB 15.2* 13.2 14.1  HCT 46.8* 40.6 42.9  MCV 99.8 99.3 97.3  PLT 264 220 224  Basic Metabolic Panel: Recent Labs  Lab 10/19/22 1449 10/20/22 1241 10/21/22 0500  NA 141 138 139  K 4.7 3.8 3.8  CL 104 104 106  CO2 26 25 25   GLUCOSE 106* 108* 88  BUN 16 23 27*  CREATININE 1.27* 1.36* 1.03*  CALCIUM 9.7 8.9 8.6*   Liver Function Tests: No results for input(s): "AST", "ALT", "ALKPHOS", "BILITOT", "PROT", "ALBUMIN" in the last 168 hours. CBG: No results for input(s): "GLUCAP" in the last 168 hours.  Discharge time spent: greater than 30 minutes.  Signed: Jonah Blue, MD [facility] 10/22/2022

## 2022-10-25 LAB — CULTURE, BLOOD (ROUTINE X 2)
Culture: NO GROWTH
Culture: NO GROWTH

## 2022-11-04 DIAGNOSIS — R300 Dysuria: Secondary | ICD-10-CM | POA: Diagnosis not present

## 2022-11-04 DIAGNOSIS — N952 Postmenopausal atrophic vaginitis: Secondary | ICD-10-CM | POA: Diagnosis not present

## 2022-11-11 DIAGNOSIS — D6869 Other thrombophilia: Secondary | ICD-10-CM | POA: Diagnosis not present

## 2022-11-11 DIAGNOSIS — M159 Polyosteoarthritis, unspecified: Secondary | ICD-10-CM | POA: Diagnosis not present

## 2022-11-11 DIAGNOSIS — N390 Urinary tract infection, site not specified: Secondary | ICD-10-CM | POA: Diagnosis not present

## 2022-11-11 DIAGNOSIS — R2681 Unsteadiness on feet: Secondary | ICD-10-CM | POA: Diagnosis not present

## 2022-11-11 DIAGNOSIS — N1831 Chronic kidney disease, stage 3a: Secondary | ICD-10-CM | POA: Diagnosis not present

## 2022-11-11 DIAGNOSIS — E2740 Unspecified adrenocortical insufficiency: Secondary | ICD-10-CM | POA: Diagnosis not present

## 2022-11-11 DIAGNOSIS — I480 Paroxysmal atrial fibrillation: Secondary | ICD-10-CM | POA: Diagnosis not present

## 2022-11-11 DIAGNOSIS — R7301 Impaired fasting glucose: Secondary | ICD-10-CM | POA: Diagnosis not present

## 2022-11-11 DIAGNOSIS — Z23 Encounter for immunization: Secondary | ICD-10-CM | POA: Diagnosis not present

## 2022-11-11 DIAGNOSIS — R413 Other amnesia: Secondary | ICD-10-CM | POA: Diagnosis not present

## 2022-11-11 DIAGNOSIS — Z8673 Personal history of transient ischemic attack (TIA), and cerebral infarction without residual deficits: Secondary | ICD-10-CM | POA: Diagnosis not present

## 2022-11-11 DIAGNOSIS — I700 Atherosclerosis of aorta: Secondary | ICD-10-CM | POA: Diagnosis not present

## 2022-11-11 DIAGNOSIS — J849 Interstitial pulmonary disease, unspecified: Secondary | ICD-10-CM | POA: Diagnosis not present

## 2022-11-12 DIAGNOSIS — I69351 Hemiplegia and hemiparesis following cerebral infarction affecting right dominant side: Secondary | ICD-10-CM | POA: Diagnosis not present

## 2022-11-12 DIAGNOSIS — I480 Paroxysmal atrial fibrillation: Secondary | ICD-10-CM | POA: Diagnosis not present

## 2022-11-12 DIAGNOSIS — U071 COVID-19: Secondary | ICD-10-CM | POA: Diagnosis not present

## 2022-11-12 DIAGNOSIS — D692 Other nonthrombocytopenic purpura: Secondary | ICD-10-CM | POA: Diagnosis not present

## 2022-11-12 DIAGNOSIS — E785 Hyperlipidemia, unspecified: Secondary | ICD-10-CM | POA: Diagnosis not present

## 2022-11-12 DIAGNOSIS — H3411 Central retinal artery occlusion, right eye: Secondary | ICD-10-CM | POA: Diagnosis not present

## 2022-11-12 DIAGNOSIS — J849 Interstitial pulmonary disease, unspecified: Secondary | ICD-10-CM | POA: Diagnosis not present

## 2022-11-12 DIAGNOSIS — M5416 Radiculopathy, lumbar region: Secondary | ICD-10-CM | POA: Diagnosis not present

## 2022-11-12 DIAGNOSIS — D6869 Other thrombophilia: Secondary | ICD-10-CM | POA: Diagnosis not present

## 2022-11-12 DIAGNOSIS — R1310 Dysphagia, unspecified: Secondary | ICD-10-CM | POA: Diagnosis not present

## 2022-11-12 DIAGNOSIS — N1831 Chronic kidney disease, stage 3a: Secondary | ICD-10-CM | POA: Diagnosis not present

## 2022-11-12 DIAGNOSIS — M5431 Sciatica, right side: Secondary | ICD-10-CM | POA: Diagnosis not present

## 2022-11-12 DIAGNOSIS — M48061 Spinal stenosis, lumbar region without neurogenic claudication: Secondary | ICD-10-CM | POA: Diagnosis not present

## 2022-11-12 DIAGNOSIS — E2740 Unspecified adrenocortical insufficiency: Secondary | ICD-10-CM | POA: Diagnosis not present

## 2022-11-12 DIAGNOSIS — D519 Vitamin B12 deficiency anemia, unspecified: Secondary | ICD-10-CM | POA: Diagnosis not present

## 2022-11-12 DIAGNOSIS — G319 Degenerative disease of nervous system, unspecified: Secondary | ICD-10-CM | POA: Diagnosis not present

## 2022-11-12 DIAGNOSIS — I700 Atherosclerosis of aorta: Secondary | ICD-10-CM | POA: Diagnosis not present

## 2022-11-12 DIAGNOSIS — I129 Hypertensive chronic kidney disease with stage 1 through stage 4 chronic kidney disease, or unspecified chronic kidney disease: Secondary | ICD-10-CM | POA: Diagnosis not present

## 2022-11-12 DIAGNOSIS — M150 Primary generalized (osteo)arthritis: Secondary | ICD-10-CM | POA: Diagnosis not present

## 2022-11-12 DIAGNOSIS — G4733 Obstructive sleep apnea (adult) (pediatric): Secondary | ICD-10-CM | POA: Diagnosis not present

## 2022-11-12 DIAGNOSIS — Z87440 Personal history of urinary (tract) infections: Secondary | ICD-10-CM | POA: Diagnosis not present

## 2022-11-12 DIAGNOSIS — J439 Emphysema, unspecified: Secondary | ICD-10-CM | POA: Diagnosis not present

## 2022-11-12 DIAGNOSIS — I69320 Aphasia following cerebral infarction: Secondary | ICD-10-CM | POA: Diagnosis not present

## 2022-11-12 DIAGNOSIS — E871 Hypo-osmolality and hyponatremia: Secondary | ICD-10-CM | POA: Diagnosis not present

## 2022-11-12 DIAGNOSIS — G9341 Metabolic encephalopathy: Secondary | ICD-10-CM | POA: Diagnosis not present

## 2022-11-18 DIAGNOSIS — M150 Primary generalized (osteo)arthritis: Secondary | ICD-10-CM | POA: Diagnosis not present

## 2022-11-18 DIAGNOSIS — N1831 Chronic kidney disease, stage 3a: Secondary | ICD-10-CM | POA: Diagnosis not present

## 2022-11-18 DIAGNOSIS — I480 Paroxysmal atrial fibrillation: Secondary | ICD-10-CM | POA: Diagnosis not present

## 2022-11-18 DIAGNOSIS — M48061 Spinal stenosis, lumbar region without neurogenic claudication: Secondary | ICD-10-CM | POA: Diagnosis not present

## 2022-11-18 DIAGNOSIS — I129 Hypertensive chronic kidney disease with stage 1 through stage 4 chronic kidney disease, or unspecified chronic kidney disease: Secondary | ICD-10-CM | POA: Diagnosis not present

## 2022-11-18 DIAGNOSIS — I69351 Hemiplegia and hemiparesis following cerebral infarction affecting right dominant side: Secondary | ICD-10-CM | POA: Diagnosis not present

## 2022-11-19 DIAGNOSIS — I69351 Hemiplegia and hemiparesis following cerebral infarction affecting right dominant side: Secondary | ICD-10-CM | POA: Diagnosis not present

## 2022-11-19 DIAGNOSIS — I480 Paroxysmal atrial fibrillation: Secondary | ICD-10-CM | POA: Diagnosis not present

## 2022-11-19 DIAGNOSIS — I129 Hypertensive chronic kidney disease with stage 1 through stage 4 chronic kidney disease, or unspecified chronic kidney disease: Secondary | ICD-10-CM | POA: Diagnosis not present

## 2022-11-19 DIAGNOSIS — M150 Primary generalized (osteo)arthritis: Secondary | ICD-10-CM | POA: Diagnosis not present

## 2022-11-19 DIAGNOSIS — M48061 Spinal stenosis, lumbar region without neurogenic claudication: Secondary | ICD-10-CM | POA: Diagnosis not present

## 2022-11-19 DIAGNOSIS — N1831 Chronic kidney disease, stage 3a: Secondary | ICD-10-CM | POA: Diagnosis not present

## 2022-11-22 DIAGNOSIS — Z0001 Encounter for general adult medical examination with abnormal findings: Secondary | ICD-10-CM | POA: Diagnosis not present

## 2022-11-22 DIAGNOSIS — I69351 Hemiplegia and hemiparesis following cerebral infarction affecting right dominant side: Secondary | ICD-10-CM | POA: Diagnosis not present

## 2022-11-22 DIAGNOSIS — M48061 Spinal stenosis, lumbar region without neurogenic claudication: Secondary | ICD-10-CM | POA: Diagnosis not present

## 2022-11-22 DIAGNOSIS — M150 Primary generalized (osteo)arthritis: Secondary | ICD-10-CM | POA: Diagnosis not present

## 2022-11-22 DIAGNOSIS — I480 Paroxysmal atrial fibrillation: Secondary | ICD-10-CM | POA: Diagnosis not present

## 2022-11-22 DIAGNOSIS — R7301 Impaired fasting glucose: Secondary | ICD-10-CM | POA: Diagnosis not present

## 2022-11-22 DIAGNOSIS — D519 Vitamin B12 deficiency anemia, unspecified: Secondary | ICD-10-CM | POA: Diagnosis not present

## 2022-11-22 DIAGNOSIS — I129 Hypertensive chronic kidney disease with stage 1 through stage 4 chronic kidney disease, or unspecified chronic kidney disease: Secondary | ICD-10-CM | POA: Diagnosis not present

## 2022-11-22 DIAGNOSIS — N1831 Chronic kidney disease, stage 3a: Secondary | ICD-10-CM | POA: Diagnosis not present

## 2022-11-22 DIAGNOSIS — E785 Hyperlipidemia, unspecified: Secondary | ICD-10-CM | POA: Diagnosis not present

## 2022-11-24 DIAGNOSIS — I480 Paroxysmal atrial fibrillation: Secondary | ICD-10-CM | POA: Diagnosis not present

## 2022-11-24 DIAGNOSIS — M150 Primary generalized (osteo)arthritis: Secondary | ICD-10-CM | POA: Diagnosis not present

## 2022-11-24 DIAGNOSIS — I129 Hypertensive chronic kidney disease with stage 1 through stage 4 chronic kidney disease, or unspecified chronic kidney disease: Secondary | ICD-10-CM | POA: Diagnosis not present

## 2022-11-24 DIAGNOSIS — M48061 Spinal stenosis, lumbar region without neurogenic claudication: Secondary | ICD-10-CM | POA: Diagnosis not present

## 2022-11-24 DIAGNOSIS — I69351 Hemiplegia and hemiparesis following cerebral infarction affecting right dominant side: Secondary | ICD-10-CM | POA: Diagnosis not present

## 2022-11-24 DIAGNOSIS — N1831 Chronic kidney disease, stage 3a: Secondary | ICD-10-CM | POA: Diagnosis not present

## 2022-12-01 DIAGNOSIS — I480 Paroxysmal atrial fibrillation: Secondary | ICD-10-CM | POA: Diagnosis not present

## 2022-12-01 DIAGNOSIS — I129 Hypertensive chronic kidney disease with stage 1 through stage 4 chronic kidney disease, or unspecified chronic kidney disease: Secondary | ICD-10-CM | POA: Diagnosis not present

## 2022-12-01 DIAGNOSIS — N1831 Chronic kidney disease, stage 3a: Secondary | ICD-10-CM | POA: Diagnosis not present

## 2022-12-01 DIAGNOSIS — I69351 Hemiplegia and hemiparesis following cerebral infarction affecting right dominant side: Secondary | ICD-10-CM | POA: Diagnosis not present

## 2022-12-01 DIAGNOSIS — M48061 Spinal stenosis, lumbar region without neurogenic claudication: Secondary | ICD-10-CM | POA: Diagnosis not present

## 2022-12-01 DIAGNOSIS — M150 Primary generalized (osteo)arthritis: Secondary | ICD-10-CM | POA: Diagnosis not present

## 2022-12-03 DIAGNOSIS — I129 Hypertensive chronic kidney disease with stage 1 through stage 4 chronic kidney disease, or unspecified chronic kidney disease: Secondary | ICD-10-CM | POA: Diagnosis not present

## 2022-12-03 DIAGNOSIS — I480 Paroxysmal atrial fibrillation: Secondary | ICD-10-CM | POA: Diagnosis not present

## 2022-12-03 DIAGNOSIS — I69351 Hemiplegia and hemiparesis following cerebral infarction affecting right dominant side: Secondary | ICD-10-CM | POA: Diagnosis not present

## 2022-12-03 DIAGNOSIS — M150 Primary generalized (osteo)arthritis: Secondary | ICD-10-CM | POA: Diagnosis not present

## 2022-12-03 DIAGNOSIS — N1831 Chronic kidney disease, stage 3a: Secondary | ICD-10-CM | POA: Diagnosis not present

## 2022-12-03 DIAGNOSIS — M48061 Spinal stenosis, lumbar region without neurogenic claudication: Secondary | ICD-10-CM | POA: Diagnosis not present

## 2022-12-06 DIAGNOSIS — M48061 Spinal stenosis, lumbar region without neurogenic claudication: Secondary | ICD-10-CM | POA: Diagnosis not present

## 2022-12-06 DIAGNOSIS — Z8673 Personal history of transient ischemic attack (TIA), and cerebral infarction without residual deficits: Secondary | ICD-10-CM | POA: Diagnosis not present

## 2022-12-06 DIAGNOSIS — I700 Atherosclerosis of aorta: Secondary | ICD-10-CM | POA: Diagnosis not present

## 2022-12-06 DIAGNOSIS — Z0000 Encounter for general adult medical examination without abnormal findings: Secondary | ICD-10-CM | POA: Diagnosis not present

## 2022-12-06 DIAGNOSIS — D6869 Other thrombophilia: Secondary | ICD-10-CM | POA: Diagnosis not present

## 2022-12-06 DIAGNOSIS — J849 Interstitial pulmonary disease, unspecified: Secondary | ICD-10-CM | POA: Diagnosis not present

## 2022-12-06 DIAGNOSIS — E785 Hyperlipidemia, unspecified: Secondary | ICD-10-CM | POA: Diagnosis not present

## 2022-12-06 DIAGNOSIS — G319 Degenerative disease of nervous system, unspecified: Secondary | ICD-10-CM | POA: Diagnosis not present

## 2022-12-06 DIAGNOSIS — N1831 Chronic kidney disease, stage 3a: Secondary | ICD-10-CM | POA: Diagnosis not present

## 2022-12-06 DIAGNOSIS — I480 Paroxysmal atrial fibrillation: Secondary | ICD-10-CM | POA: Diagnosis not present

## 2022-12-06 DIAGNOSIS — R1310 Dysphagia, unspecified: Secondary | ICD-10-CM | POA: Diagnosis not present

## 2022-12-06 DIAGNOSIS — R82998 Other abnormal findings in urine: Secondary | ICD-10-CM | POA: Diagnosis not present

## 2022-12-06 DIAGNOSIS — N390 Urinary tract infection, site not specified: Secondary | ICD-10-CM | POA: Diagnosis not present

## 2022-12-07 DIAGNOSIS — J439 Emphysema, unspecified: Secondary | ICD-10-CM | POA: Diagnosis not present

## 2022-12-07 DIAGNOSIS — M48061 Spinal stenosis, lumbar region without neurogenic claudication: Secondary | ICD-10-CM | POA: Diagnosis not present

## 2022-12-07 DIAGNOSIS — I129 Hypertensive chronic kidney disease with stage 1 through stage 4 chronic kidney disease, or unspecified chronic kidney disease: Secondary | ICD-10-CM | POA: Diagnosis not present

## 2022-12-07 DIAGNOSIS — I480 Paroxysmal atrial fibrillation: Secondary | ICD-10-CM | POA: Diagnosis not present

## 2022-12-07 DIAGNOSIS — M150 Primary generalized (osteo)arthritis: Secondary | ICD-10-CM | POA: Diagnosis not present

## 2022-12-07 DIAGNOSIS — E2740 Unspecified adrenocortical insufficiency: Secondary | ICD-10-CM | POA: Diagnosis not present

## 2022-12-07 DIAGNOSIS — D692 Other nonthrombocytopenic purpura: Secondary | ICD-10-CM | POA: Diagnosis not present

## 2022-12-07 DIAGNOSIS — I69351 Hemiplegia and hemiparesis following cerebral infarction affecting right dominant side: Secondary | ICD-10-CM | POA: Diagnosis not present

## 2022-12-07 DIAGNOSIS — D6869 Other thrombophilia: Secondary | ICD-10-CM | POA: Diagnosis not present

## 2022-12-07 DIAGNOSIS — G319 Degenerative disease of nervous system, unspecified: Secondary | ICD-10-CM | POA: Diagnosis not present

## 2022-12-07 DIAGNOSIS — N1831 Chronic kidney disease, stage 3a: Secondary | ICD-10-CM | POA: Diagnosis not present

## 2022-12-07 DIAGNOSIS — I700 Atherosclerosis of aorta: Secondary | ICD-10-CM | POA: Diagnosis not present

## 2022-12-09 ENCOUNTER — Ambulatory Visit: Payer: Self-pay

## 2022-12-09 NOTE — Patient Outreach (Addendum)
  Care Coordination   Follow Up Visit Note   12/09/2022 Name: [name] MRN: [id number] DOB: 1931-03-22  [name] is a 89 y.o. year old female who sees Rodrigo Ran, MD for primary care. I spoke with daughter [name] by phone today.  What matters to the patients health and wellness today?  Patient would like to remain free from UTI's.     Goals Addressed             This Visit's Progress    To clarify use of prescribed antibiotic for reoccurring UTI   On track    Care Coordination Interventions: Completed successful outbound call with daughter [name]  Evaluation of current treatment plan related to chronic UTI and patient's adherence to plan as established by provider Determined patient experienced an admission at [facility] from 10/20/22-10/22/22 with dx: Infection due to ESBL-producing Klebsiella pneumoniae  Discussed with daughter [name] patient is doing well, she completed her physical with Dr. Waynard Edwards on 12/06/22 with no new recommendations or changes to her treatment plan Discussed with daughter [name], she was advised patient will need to go to the ED for IV antibiotics for ongoing chronic UTI's due to oral antibiotics are no longer effective Reinforced the importance of having patient stay well hydrated with water and to contact her doctor with new symptoms or concerns     Interventions Today    Flowsheet Row Most Recent Value  Chronic Disease   Chronic disease during today's visit Other  [s/p UTI]  General Interventions   General Interventions Discussed/Reviewed General Interventions Discussed, General Interventions Reviewed, Doctor Visits  Doctor Visits Discussed/Reviewed PCP, Doctor Visits Reviewed, Doctor Visits Discussed  Education Interventions   Education Provided Provided Education  Provided Verbal Education On When to see the doctor, Nutrition, Medication  Nutrition Interventions   Nutrition Discussed/Reviewed Nutrition  Discussed, Nutrition Reviewed, Fluid intake  Pharmacy Interventions   Pharmacy Dicussed/Reviewed Pharmacy Topics Reviewed, Pharmacy Topics Discussed, Medications and their functions          SDOH assessments and interventions completed:  No     Care Coordination Interventions:  Yes, provided   Follow up plan: Follow up call scheduled for 03/01/23 @09 :30 AM    Encounter Outcome:  Patient Visit Completed

## 2022-12-09 NOTE — Patient Instructions (Signed)
Visit Information  Thank you for taking time to visit with me today. Please don't hesitate to contact me if I can be of assistance to you.   Following are the goals we discussed today:   Goals Addressed             This Visit's Progress    To clarify use of prescribed antibiotic for reoccurring UTI   On track    Care Coordination Interventions: Completed successful outbound call with daughter [name]  Evaluation of current treatment plan related to chronic UTI and patient's adherence to plan as established by provider Determined patient experienced an admission at [facility] from 10/20/22-10/22/22 with dx: Infection due to ESBL-producing Klebsiella pneumoniae  Discussed with daughter [name] patient is doing well, she completed her physical with Dr. Waynard Edwards on 12/06/22 with no new recommendations or changes to her treatment plan Discussed with daughter [name], she was advised patient will need to go to the ED for IV antibiotics for ongoing chronic UTI's due to oral antibiotics are no longer effective Reinforced the importance of having patient stay well hydrated with water and to contact her doctor with new symptoms or concerns         Our next appointment is by telephone on 03/01/23 at 09:30 AM  Please call the care guide team at [phone] if you need to cancel or reschedule your appointment.   If you are experiencing a Mental Health or Behavioral Health Crisis or need someone to talk to, please call 1-800-273-TALK (toll free, 24 hour hotline)  Patient verbalizes understanding of instructions and care plan provided today and agrees to view in MyChart. Active MyChart status and patient understanding of how to access instructions and care plan via MyChart confirmed with patient.     Delsa Sale RN BSN CCM [place]  [facility], [facility] Nurse Care Coordinator  Direct Dial: [phone] Website: [email or web address]

## 2022-12-10 DIAGNOSIS — I69351 Hemiplegia and hemiparesis following cerebral infarction affecting right dominant side: Secondary | ICD-10-CM | POA: Diagnosis not present

## 2022-12-10 DIAGNOSIS — I480 Paroxysmal atrial fibrillation: Secondary | ICD-10-CM | POA: Diagnosis not present

## 2022-12-10 DIAGNOSIS — I129 Hypertensive chronic kidney disease with stage 1 through stage 4 chronic kidney disease, or unspecified chronic kidney disease: Secondary | ICD-10-CM | POA: Diagnosis not present

## 2022-12-10 DIAGNOSIS — M150 Primary generalized (osteo)arthritis: Secondary | ICD-10-CM | POA: Diagnosis not present

## 2022-12-10 DIAGNOSIS — M48061 Spinal stenosis, lumbar region without neurogenic claudication: Secondary | ICD-10-CM | POA: Diagnosis not present

## 2022-12-10 DIAGNOSIS — N1831 Chronic kidney disease, stage 3a: Secondary | ICD-10-CM | POA: Diagnosis not present

## 2022-12-12 DIAGNOSIS — M150 Primary generalized (osteo)arthritis: Secondary | ICD-10-CM | POA: Diagnosis not present

## 2022-12-12 DIAGNOSIS — I700 Atherosclerosis of aorta: Secondary | ICD-10-CM | POA: Diagnosis not present

## 2022-12-12 DIAGNOSIS — D519 Vitamin B12 deficiency anemia, unspecified: Secondary | ICD-10-CM | POA: Diagnosis not present

## 2022-12-12 DIAGNOSIS — R1310 Dysphagia, unspecified: Secondary | ICD-10-CM | POA: Diagnosis not present

## 2022-12-12 DIAGNOSIS — I69351 Hemiplegia and hemiparesis following cerebral infarction affecting right dominant side: Secondary | ICD-10-CM | POA: Diagnosis not present

## 2022-12-12 DIAGNOSIS — M5431 Sciatica, right side: Secondary | ICD-10-CM | POA: Diagnosis not present

## 2022-12-12 DIAGNOSIS — N1831 Chronic kidney disease, stage 3a: Secondary | ICD-10-CM | POA: Diagnosis not present

## 2022-12-12 DIAGNOSIS — E2740 Unspecified adrenocortical insufficiency: Secondary | ICD-10-CM | POA: Diagnosis not present

## 2022-12-12 DIAGNOSIS — I129 Hypertensive chronic kidney disease with stage 1 through stage 4 chronic kidney disease, or unspecified chronic kidney disease: Secondary | ICD-10-CM | POA: Diagnosis not present

## 2022-12-12 DIAGNOSIS — M48061 Spinal stenosis, lumbar region without neurogenic claudication: Secondary | ICD-10-CM | POA: Diagnosis not present

## 2022-12-12 DIAGNOSIS — Z87440 Personal history of urinary (tract) infections: Secondary | ICD-10-CM | POA: Diagnosis not present

## 2022-12-12 DIAGNOSIS — E871 Hypo-osmolality and hyponatremia: Secondary | ICD-10-CM | POA: Diagnosis not present

## 2022-12-12 DIAGNOSIS — D6869 Other thrombophilia: Secondary | ICD-10-CM | POA: Diagnosis not present

## 2022-12-12 DIAGNOSIS — H3411 Central retinal artery occlusion, right eye: Secondary | ICD-10-CM | POA: Diagnosis not present

## 2022-12-12 DIAGNOSIS — J849 Interstitial pulmonary disease, unspecified: Secondary | ICD-10-CM | POA: Diagnosis not present

## 2022-12-12 DIAGNOSIS — M5416 Radiculopathy, lumbar region: Secondary | ICD-10-CM | POA: Diagnosis not present

## 2022-12-12 DIAGNOSIS — G9341 Metabolic encephalopathy: Secondary | ICD-10-CM | POA: Diagnosis not present

## 2022-12-12 DIAGNOSIS — G319 Degenerative disease of nervous system, unspecified: Secondary | ICD-10-CM | POA: Diagnosis not present

## 2022-12-12 DIAGNOSIS — J439 Emphysema, unspecified: Secondary | ICD-10-CM | POA: Diagnosis not present

## 2022-12-12 DIAGNOSIS — G4733 Obstructive sleep apnea (adult) (pediatric): Secondary | ICD-10-CM | POA: Diagnosis not present

## 2022-12-12 DIAGNOSIS — I69320 Aphasia following cerebral infarction: Secondary | ICD-10-CM | POA: Diagnosis not present

## 2022-12-12 DIAGNOSIS — I480 Paroxysmal atrial fibrillation: Secondary | ICD-10-CM | POA: Diagnosis not present

## 2022-12-12 DIAGNOSIS — E785 Hyperlipidemia, unspecified: Secondary | ICD-10-CM | POA: Diagnosis not present

## 2022-12-12 DIAGNOSIS — D692 Other nonthrombocytopenic purpura: Secondary | ICD-10-CM | POA: Diagnosis not present

## 2022-12-12 DIAGNOSIS — U071 COVID-19: Secondary | ICD-10-CM | POA: Diagnosis not present

## 2022-12-13 DIAGNOSIS — I69351 Hemiplegia and hemiparesis following cerebral infarction affecting right dominant side: Secondary | ICD-10-CM | POA: Diagnosis not present

## 2022-12-13 DIAGNOSIS — M48061 Spinal stenosis, lumbar region without neurogenic claudication: Secondary | ICD-10-CM | POA: Diagnosis not present

## 2022-12-13 DIAGNOSIS — I480 Paroxysmal atrial fibrillation: Secondary | ICD-10-CM | POA: Diagnosis not present

## 2022-12-13 DIAGNOSIS — N1831 Chronic kidney disease, stage 3a: Secondary | ICD-10-CM | POA: Diagnosis not present

## 2022-12-13 DIAGNOSIS — I129 Hypertensive chronic kidney disease with stage 1 through stage 4 chronic kidney disease, or unspecified chronic kidney disease: Secondary | ICD-10-CM | POA: Diagnosis not present

## 2022-12-13 DIAGNOSIS — M150 Primary generalized (osteo)arthritis: Secondary | ICD-10-CM | POA: Diagnosis not present

## 2022-12-20 DIAGNOSIS — M48061 Spinal stenosis, lumbar region without neurogenic claudication: Secondary | ICD-10-CM | POA: Diagnosis not present

## 2022-12-20 DIAGNOSIS — N1831 Chronic kidney disease, stage 3a: Secondary | ICD-10-CM | POA: Diagnosis not present

## 2022-12-20 DIAGNOSIS — I129 Hypertensive chronic kidney disease with stage 1 through stage 4 chronic kidney disease, or unspecified chronic kidney disease: Secondary | ICD-10-CM | POA: Diagnosis not present

## 2022-12-20 DIAGNOSIS — I480 Paroxysmal atrial fibrillation: Secondary | ICD-10-CM | POA: Diagnosis not present

## 2022-12-20 DIAGNOSIS — M150 Primary generalized (osteo)arthritis: Secondary | ICD-10-CM | POA: Diagnosis not present

## 2022-12-20 DIAGNOSIS — I69351 Hemiplegia and hemiparesis following cerebral infarction affecting right dominant side: Secondary | ICD-10-CM | POA: Diagnosis not present

## 2022-12-28 DIAGNOSIS — N1831 Chronic kidney disease, stage 3a: Secondary | ICD-10-CM | POA: Diagnosis not present

## 2022-12-28 DIAGNOSIS — M150 Primary generalized (osteo)arthritis: Secondary | ICD-10-CM | POA: Diagnosis not present

## 2022-12-28 DIAGNOSIS — I129 Hypertensive chronic kidney disease with stage 1 through stage 4 chronic kidney disease, or unspecified chronic kidney disease: Secondary | ICD-10-CM | POA: Diagnosis not present

## 2022-12-28 DIAGNOSIS — I69351 Hemiplegia and hemiparesis following cerebral infarction affecting right dominant side: Secondary | ICD-10-CM | POA: Diagnosis not present

## 2022-12-28 DIAGNOSIS — M48061 Spinal stenosis, lumbar region without neurogenic claudication: Secondary | ICD-10-CM | POA: Diagnosis not present

## 2022-12-28 DIAGNOSIS — I480 Paroxysmal atrial fibrillation: Secondary | ICD-10-CM | POA: Diagnosis not present

## 2022-12-29 ENCOUNTER — Other Ambulatory Visit (HOSPITAL_COMMUNITY): Payer: Self-pay

## 2023-01-07 DIAGNOSIS — I480 Paroxysmal atrial fibrillation: Secondary | ICD-10-CM | POA: Diagnosis not present

## 2023-01-07 DIAGNOSIS — I69351 Hemiplegia and hemiparesis following cerebral infarction affecting right dominant side: Secondary | ICD-10-CM | POA: Diagnosis not present

## 2023-01-07 DIAGNOSIS — I129 Hypertensive chronic kidney disease with stage 1 through stage 4 chronic kidney disease, or unspecified chronic kidney disease: Secondary | ICD-10-CM | POA: Diagnosis not present

## 2023-01-07 DIAGNOSIS — M150 Primary generalized (osteo)arthritis: Secondary | ICD-10-CM | POA: Diagnosis not present

## 2023-01-07 DIAGNOSIS — M48061 Spinal stenosis, lumbar region without neurogenic claudication: Secondary | ICD-10-CM | POA: Diagnosis not present

## 2023-01-07 DIAGNOSIS — N1831 Chronic kidney disease, stage 3a: Secondary | ICD-10-CM | POA: Diagnosis not present

## 2023-01-11 DIAGNOSIS — M150 Primary generalized (osteo)arthritis: Secondary | ICD-10-CM | POA: Diagnosis not present

## 2023-01-11 DIAGNOSIS — E871 Hypo-osmolality and hyponatremia: Secondary | ICD-10-CM | POA: Diagnosis not present

## 2023-01-11 DIAGNOSIS — G319 Degenerative disease of nervous system, unspecified: Secondary | ICD-10-CM | POA: Diagnosis not present

## 2023-01-11 DIAGNOSIS — M5416 Radiculopathy, lumbar region: Secondary | ICD-10-CM | POA: Diagnosis not present

## 2023-01-11 DIAGNOSIS — I700 Atherosclerosis of aorta: Secondary | ICD-10-CM | POA: Diagnosis not present

## 2023-01-11 DIAGNOSIS — G4733 Obstructive sleep apnea (adult) (pediatric): Secondary | ICD-10-CM | POA: Diagnosis not present

## 2023-01-11 DIAGNOSIS — N1831 Chronic kidney disease, stage 3a: Secondary | ICD-10-CM | POA: Diagnosis not present

## 2023-01-11 DIAGNOSIS — D519 Vitamin B12 deficiency anemia, unspecified: Secondary | ICD-10-CM | POA: Diagnosis not present

## 2023-01-11 DIAGNOSIS — M48061 Spinal stenosis, lumbar region without neurogenic claudication: Secondary | ICD-10-CM | POA: Diagnosis not present

## 2023-01-11 DIAGNOSIS — E785 Hyperlipidemia, unspecified: Secondary | ICD-10-CM | POA: Diagnosis not present

## 2023-01-11 DIAGNOSIS — I69320 Aphasia following cerebral infarction: Secondary | ICD-10-CM | POA: Diagnosis not present

## 2023-01-11 DIAGNOSIS — Z7952 Long term (current) use of systemic steroids: Secondary | ICD-10-CM | POA: Diagnosis not present

## 2023-01-11 DIAGNOSIS — D692 Other nonthrombocytopenic purpura: Secondary | ICD-10-CM | POA: Diagnosis not present

## 2023-01-11 DIAGNOSIS — G9341 Metabolic encephalopathy: Secondary | ICD-10-CM | POA: Diagnosis not present

## 2023-01-11 DIAGNOSIS — M5431 Sciatica, right side: Secondary | ICD-10-CM | POA: Diagnosis not present

## 2023-01-11 DIAGNOSIS — D6869 Other thrombophilia: Secondary | ICD-10-CM | POA: Diagnosis not present

## 2023-01-11 DIAGNOSIS — I480 Paroxysmal atrial fibrillation: Secondary | ICD-10-CM | POA: Diagnosis not present

## 2023-01-11 DIAGNOSIS — H3411 Central retinal artery occlusion, right eye: Secondary | ICD-10-CM | POA: Diagnosis not present

## 2023-01-11 DIAGNOSIS — I69351 Hemiplegia and hemiparesis following cerebral infarction affecting right dominant side: Secondary | ICD-10-CM | POA: Diagnosis not present

## 2023-01-11 DIAGNOSIS — R1310 Dysphagia, unspecified: Secondary | ICD-10-CM | POA: Diagnosis not present

## 2023-01-11 DIAGNOSIS — I129 Hypertensive chronic kidney disease with stage 1 through stage 4 chronic kidney disease, or unspecified chronic kidney disease: Secondary | ICD-10-CM | POA: Diagnosis not present

## 2023-01-11 DIAGNOSIS — J849 Interstitial pulmonary disease, unspecified: Secondary | ICD-10-CM | POA: Diagnosis not present

## 2023-01-11 DIAGNOSIS — Z87440 Personal history of urinary (tract) infections: Secondary | ICD-10-CM | POA: Diagnosis not present

## 2023-01-11 DIAGNOSIS — E2740 Unspecified adrenocortical insufficiency: Secondary | ICD-10-CM | POA: Diagnosis not present

## 2023-01-11 DIAGNOSIS — J439 Emphysema, unspecified: Secondary | ICD-10-CM | POA: Diagnosis not present

## 2023-01-20 DIAGNOSIS — N1831 Chronic kidney disease, stage 3a: Secondary | ICD-10-CM | POA: Diagnosis not present

## 2023-01-20 DIAGNOSIS — M48061 Spinal stenosis, lumbar region without neurogenic claudication: Secondary | ICD-10-CM | POA: Diagnosis not present

## 2023-01-20 DIAGNOSIS — I69351 Hemiplegia and hemiparesis following cerebral infarction affecting right dominant side: Secondary | ICD-10-CM | POA: Diagnosis not present

## 2023-01-20 DIAGNOSIS — I480 Paroxysmal atrial fibrillation: Secondary | ICD-10-CM | POA: Diagnosis not present

## 2023-01-20 DIAGNOSIS — I129 Hypertensive chronic kidney disease with stage 1 through stage 4 chronic kidney disease, or unspecified chronic kidney disease: Secondary | ICD-10-CM | POA: Diagnosis not present

## 2023-01-20 DIAGNOSIS — M150 Primary generalized (osteo)arthritis: Secondary | ICD-10-CM | POA: Diagnosis not present

## 2023-02-03 ENCOUNTER — Emergency Department (HOSPITAL_BASED_OUTPATIENT_CLINIC_OR_DEPARTMENT_OTHER): Payer: Medicare Other

## 2023-02-03 ENCOUNTER — Other Ambulatory Visit: Payer: Self-pay

## 2023-02-03 ENCOUNTER — Emergency Department (HOSPITAL_BASED_OUTPATIENT_CLINIC_OR_DEPARTMENT_OTHER)
Admission: EM | Admit: 2023-02-03 | Discharge: 2023-02-03 | Disposition: A | Discharge status: Home / Self Care | Payer: Medicare Other | Attending: Emergency Medicine | Admitting: Emergency Medicine

## 2023-02-03 DIAGNOSIS — I10 Essential (primary) hypertension: Secondary | ICD-10-CM | POA: Insufficient documentation

## 2023-02-03 DIAGNOSIS — M48061 Spinal stenosis, lumbar region without neurogenic claudication: Secondary | ICD-10-CM | POA: Diagnosis not present

## 2023-02-03 DIAGNOSIS — I69351 Hemiplegia and hemiparesis following cerebral infarction affecting right dominant side: Secondary | ICD-10-CM | POA: Diagnosis not present

## 2023-02-03 DIAGNOSIS — S0990XA Unspecified injury of head, initial encounter: Secondary | ICD-10-CM

## 2023-02-03 DIAGNOSIS — Z8673 Personal history of transient ischemic attack (TIA), and cerebral infarction without residual deficits: Secondary | ICD-10-CM | POA: Diagnosis not present

## 2023-02-03 DIAGNOSIS — G319 Degenerative disease of nervous system, unspecified: Secondary | ICD-10-CM | POA: Diagnosis not present

## 2023-02-03 DIAGNOSIS — N1831 Chronic kidney disease, stage 3a: Secondary | ICD-10-CM | POA: Diagnosis not present

## 2023-02-03 DIAGNOSIS — Z79899 Other long term (current) drug therapy: Secondary | ICD-10-CM | POA: Insufficient documentation

## 2023-02-03 DIAGNOSIS — Z7901 Long term (current) use of anticoagulants: Secondary | ICD-10-CM | POA: Insufficient documentation

## 2023-02-03 DIAGNOSIS — I480 Paroxysmal atrial fibrillation: Secondary | ICD-10-CM | POA: Diagnosis not present

## 2023-02-03 DIAGNOSIS — W19XXXA Unspecified fall, initial encounter: Secondary | ICD-10-CM

## 2023-02-03 DIAGNOSIS — S0001XA Abrasion of scalp, initial encounter: Secondary | ICD-10-CM | POA: Insufficient documentation

## 2023-02-03 DIAGNOSIS — M150 Primary generalized (osteo)arthritis: Secondary | ICD-10-CM | POA: Diagnosis not present

## 2023-02-03 DIAGNOSIS — Z043 Encounter for examination and observation following other accident: Secondary | ICD-10-CM | POA: Diagnosis not present

## 2023-02-03 DIAGNOSIS — W01198A Fall on same level from slipping, tripping and stumbling with subsequent striking against other object, initial encounter: Secondary | ICD-10-CM | POA: Diagnosis not present

## 2023-02-03 DIAGNOSIS — J439 Emphysema, unspecified: Secondary | ICD-10-CM | POA: Diagnosis not present

## 2023-02-03 DIAGNOSIS — I6782 Cerebral ischemia: Secondary | ICD-10-CM | POA: Diagnosis not present

## 2023-02-03 DIAGNOSIS — I129 Hypertensive chronic kidney disease with stage 1 through stage 4 chronic kidney disease, or unspecified chronic kidney disease: Secondary | ICD-10-CM | POA: Diagnosis not present

## 2023-02-03 NOTE — Discharge Instructions (Signed)
 You were seen in the emergency department after a fall.  As we discussed the CT of your head and neck showed no abnormal findings.  The wound on the back of your head looks more like an abrasion, does not require sutures or staples.  I recommend cleaning it gently with a warm cloth, avoid scrubbing it.

## 2023-02-03 NOTE — ED Provider Notes (Signed)
 [place] EMERGENCY DEPARTMENT AT [facility] Provider Note   CSN: [id number] Arrival date & time: 02/03/23  1731     History  Chief Complaint  Patient presents with   [name] is a 89 y.o. female with history of A-fib, on Eliquis , OSA, hypertension, adrenal insufficiency, stroke, hyperlipidemia, emphysema, cognitive decline, who presents the emergency department after a fall.  Patient was wearing socks and slipped on the floor, around 1500 this afternoon.  [name] struck the back of her head.  No loss of consciousness.   Fall       Home Medications Prior to Admission medications   Medication Sig Start Date End Date Taking? Authorizing Provider  albuterol  (VENTOLIN  HFA) 108 (90 Base) MCG/ACT inhaler Inhale 1-2 puffs into the lungs every 6 (six) hours as needed. Patient taking differently: Inhale 1-2 puffs into the lungs every 6 (six) hours as needed for wheezing or shortness of breath. 09/02/22   Parrett, Madelin RAMAN, NP  amLODipine  (NORVASC ) 2.5 MG tablet Take 1 tablet (2.5 mg total) by mouth daily. 02/25/21 10/20/22  Lelon Hamilton T, PA-C  Cholecalciferol  (VITAMIN D3) 50 MCG (2000 UT) CAPS Take 2,000 Units by mouth in the morning.    [provider]  CRANBERRY PO Take 650 mg by mouth in the morning.    [provider]  ELIQUIS  5 MG TABS tablet TAKE 1 TABLET TWICE A DAY Patient taking differently: Take 5 mg by mouth in the morning and at bedtime. 02/09/22   Jeffrie Oneil BROCKS, MD  famotidine  (PEPCID ) 40 MG tablet Take 40 mg by mouth daily before supper.    [provider]  GEMTESA 75 MG TABS Take 75 mg by mouth at bedtime.    [provider]  hydrocortisone  (CORTEF ) 5 MG tablet Take 1 tablet (5 mg total) by mouth as directed. 3 tablets in the morning and 1 tablet between 2-4 pm Patient not taking: Reported on 10/20/2022 10/19/22   Shamleffer, Donell Cardinal, MD  ipratropium-albuterol  (DUONEB) 0.5-2.5 (3) MG/3ML SOLN Take 3 mLs by  nebulization every 6 (six) hours as needed. Patient taking differently: Take 3 mLs by nebulization at bedtime. 02/26/22   Jude Harden GAILS, MD  Multiple Vitamins-Minerals (SENIOR VITES PO) Take 1 tablet by mouth daily with breakfast.    [provider]  predniSONE  (DELTASONE ) 5 MG tablet Take 5 mg by mouth daily with breakfast.    [provider]  Probiotic Product (PHILLIPS COLON HEALTH) CAPS Take 1 capsule by mouth every evening.    [provider]  Spacer/Aero-Holding Chambers (AEROCHAMBER Z-STAT PLUS CHAMBR) MISC See admin instructions. 07/01/22   [provider]  trimethoprim (TRIMPEX) 100 MG tablet Take 100 mg by mouth daily. 09/04/22   [provider]      Allergies    Bee venom, Mixed vespid venom, Wasp venom, Yellow jacket venom, Codeine, Sulfa antibiotics, and Erythromycin    Review of Systems   Review of Systems  Skin:        Scalp wound  All other systems reviewed and are negative.   Physical Exam Updated Vital Signs BP (!) 170/69 (BP Location: Right Arm)   Pulse 63   Temp (!) 97.5 F (36.4 C)   Resp 18   LMP  (LMP Unknown)   SpO2 100%  Physical Exam Vitals and nursing note reviewed.  Constitutional:      Appearance: Normal appearance.  HENT:     Head: Normocephalic. Abrasion present. No raccoon eyes  or Battle's sign.     Comments: Abrasion to the occiput, bleeding is controlled Eyes:     Conjunctiva/sclera: Conjunctivae normal.  Neck:     Comments: No midline spinal tenderness or deformities palpated Pulmonary:     Effort: Pulmonary effort is normal. No respiratory distress.  Chest:     Comments: Chest wall stable Musculoskeletal:     Comments: Pelvis stable  Skin:    General: Skin is warm and dry.  Neurological:     Mental Status: [name] is alert.     Comments: No slurred speech, no facial droop. Moving all extremities normally  Psychiatric:        Mood and Affect: Mood normal.        Behavior: Behavior normal.      ED Results / Procedures / Treatments   Labs (all labs ordered are listed, but only abnormal results are displayed) Labs Reviewed - No data to display  EKG None  Radiology CT Head Wo Contrast Result Date: 02/03/2023 CLINICAL DATA:  Fall EXAM: CT HEAD WITHOUT CONTRAST CT CERVICAL SPINE WITHOUT CONTRAST TECHNIQUE: Multidetector CT imaging of the head and cervical spine was performed following the standard protocol without intravenous contrast. Multiplanar CT image reconstructions of the cervical spine were also generated. RADIATION DOSE REDUCTION: This exam was performed according to the departmental dose-optimization program which includes automated exposure control, adjustment of the mA and/or kV according to patient size and/or use of iterative reconstruction technique. COMPARISON:  CT brain 10/29/2021 FINDINGS: CT HEAD FINDINGS Brain: No acute territorial infarction, hemorrhage or intracranial mass. Moderate atrophy. Moderate chronic small vessel ischemic changes of the white matter. Small chronic thalamic infarcts. Prominent ventricles probably related to atrophy, this is stable. Vascular: No hyperdense vessels.  Carotid vascular calcification Skull: Normal. Negative for fracture or focal lesion. Sinuses/Orbits: No acute finding. Other: None CT CERVICAL SPINE FINDINGS Alignment: Straightening of the cervical spine. Trace anterolisthesis C7 on T1. Facet alignment is within normal limits. Skull base and vertebrae: No acute fracture. No primary bone lesion or focal pathologic process. Soft tissues and spinal canal: No prevertebral fluid or swelling. No visible canal hematoma. Disc levels: Multilevel degenerative changes. Moderate disc space narrowing at C3-C4 with advanced degenerative changes C5-C6, C6-C7 and C7-T1. Facet degenerative changes at multiple levels with foraminal narrowing Upper chest: Apical emphysema Other: None IMPRESSION: 1. No CT evidence for acute intracranial abnormality. Atrophy  and chronic small vessel ischemic changes of the white matter. 2. Straightening of the cervical spine with multilevel degenerative changes. No acute osseous abnormality. Electronically Signed   By: Luke Bun M.D.   On: 02/03/2023 18:17   CT Cervical Spine Wo Contrast Result Date: 02/03/2023 CLINICAL DATA:  Fall EXAM: CT HEAD WITHOUT CONTRAST CT CERVICAL SPINE WITHOUT CONTRAST TECHNIQUE: Multidetector CT imaging of the head and cervical spine was performed following the standard protocol without intravenous contrast. Multiplanar CT image reconstructions of the cervical spine were also generated. RADIATION DOSE REDUCTION: This exam was performed according to the departmental dose-optimization program which includes automated exposure control, adjustment of the mA and/or kV according to patient size and/or use of iterative reconstruction technique. COMPARISON:  CT brain 10/29/2021 FINDINGS: CT HEAD FINDINGS Brain: No acute territorial infarction, hemorrhage or intracranial mass. Moderate atrophy. Moderate chronic small vessel ischemic changes of the white matter. Small chronic thalamic infarcts. Prominent ventricles probably related to atrophy, this is stable. Vascular: No hyperdense vessels.  Carotid vascular calcification Skull: Normal. Negative for fracture or focal lesion. Sinuses/Orbits: No acute finding.  Other: None CT CERVICAL SPINE FINDINGS Alignment: Straightening of the cervical spine. Trace anterolisthesis C7 on T1. Facet alignment is within normal limits. Skull base and vertebrae: No acute fracture. No primary bone lesion or focal pathologic process. Soft tissues and spinal canal: No prevertebral fluid or swelling. No visible canal hematoma. Disc levels: Multilevel degenerative changes. Moderate disc space narrowing at C3-C4 with advanced degenerative changes C5-C6, C6-C7 and C7-T1. Facet degenerative changes at multiple levels with foraminal narrowing Upper chest: Apical emphysema Other: None  IMPRESSION: 1. No CT evidence for acute intracranial abnormality. Atrophy and chronic small vessel ischemic changes of the white matter. 2. Straightening of the cervical spine with multilevel degenerative changes. No acute osseous abnormality. Electronically Signed   By: Luke Bun M.D.   On: 02/03/2023 18:17    Procedures Procedures    Medications Ordered in ED Medications - No data to display  ED Course/ Medical Decision Making/ A&P                                 Medical Decision Making Amount and/or Complexity of Data Reviewed Radiology: ordered.  This patient is a 89 y.o. female  who presents to the ED for concern of fall on blood thinners.   Differential diagnoses prior to evaluation: The emergent differential diagnosis includes, but is not limited to,  fracture, dislocation, ligamentous injury, internal bleeding, intracranial hemorrhage. This is not an exhaustive differential.   Past Medical History / Co-morbidities / Social History: A-fib, on Eliquis , OSA, hypertension, adrenal insufficiency, stroke, hyperlipidemia, emphysema, cognitive decline  Physical Exam: Physical exam performed. The pertinent findings include: Hypertensive, otherwise normal vital signs.  No acute distress.  Small abrasion noted to the the occiput, no midline cervical spinal tenderness, step-offs or crepitus.  Moving all extremities normally.  Lab Tests/Imaging studies: I personally interpreted labs/imaging and the pertinent results include:  CT head and cervical spine without acute findings. I agree with the radiologist interpretation.  Disposition: After consideration of the diagnostic results and the patients response to treatment, I feel that emergency department workup does not suggest an emergent condition requiring admission or immediate intervention beyond what has been performed at this time. The plan is: Discharge to home.  Given wound care recommendations.. The patient is safe for discharge  and has been instructed to return immediately for worsening symptoms, change in symptoms or any other concerns.  Final Clinical Impression(s) / ED Diagnoses Final diagnoses:  Fall, initial encounter  Abrasion of scalp, initial encounter  Injury of head, initial encounter    Rx / DC Orders ED Discharge Orders     None      Portions of this report may have been transcribed using voice recognition software. Every effort was made to ensure accuracy; however, inadvertent computerized transcription errors may be present.    Janeane Cozart T, PA-C 02/03/23 1827    Mannie Pac T, DO 02/03/23 2253

## 2023-02-03 NOTE — ED Triage Notes (Signed)
 Fall 1500. Slipped with socks on slick floor. Struck back of head. Small abrasion with dried blood to back of head. No LOC. On eliquis-afib. No neck pain at this time.

## 2023-02-04 DIAGNOSIS — D692 Other nonthrombocytopenic purpura: Secondary | ICD-10-CM | POA: Diagnosis not present

## 2023-02-04 DIAGNOSIS — D6869 Other thrombophilia: Secondary | ICD-10-CM | POA: Diagnosis not present

## 2023-02-04 DIAGNOSIS — E2740 Unspecified adrenocortical insufficiency: Secondary | ICD-10-CM | POA: Diagnosis not present

## 2023-02-04 DIAGNOSIS — N1831 Chronic kidney disease, stage 3a: Secondary | ICD-10-CM | POA: Diagnosis not present

## 2023-02-04 DIAGNOSIS — M48061 Spinal stenosis, lumbar region without neurogenic claudication: Secondary | ICD-10-CM | POA: Diagnosis not present

## 2023-02-04 DIAGNOSIS — I69351 Hemiplegia and hemiparesis following cerebral infarction affecting right dominant side: Secondary | ICD-10-CM | POA: Diagnosis not present

## 2023-02-04 DIAGNOSIS — G319 Degenerative disease of nervous system, unspecified: Secondary | ICD-10-CM | POA: Diagnosis not present

## 2023-02-04 DIAGNOSIS — J439 Emphysema, unspecified: Secondary | ICD-10-CM | POA: Diagnosis not present

## 2023-02-04 DIAGNOSIS — M150 Primary generalized (osteo)arthritis: Secondary | ICD-10-CM | POA: Diagnosis not present

## 2023-02-04 DIAGNOSIS — I700 Atherosclerosis of aorta: Secondary | ICD-10-CM | POA: Diagnosis not present

## 2023-02-04 DIAGNOSIS — I480 Paroxysmal atrial fibrillation: Secondary | ICD-10-CM | POA: Diagnosis not present

## 2023-02-04 DIAGNOSIS — I129 Hypertensive chronic kidney disease with stage 1 through stage 4 chronic kidney disease, or unspecified chronic kidney disease: Secondary | ICD-10-CM | POA: Diagnosis not present

## 2023-02-07 DIAGNOSIS — I480 Paroxysmal atrial fibrillation: Secondary | ICD-10-CM | POA: Diagnosis not present

## 2023-02-07 DIAGNOSIS — N1831 Chronic kidney disease, stage 3a: Secondary | ICD-10-CM | POA: Diagnosis not present

## 2023-02-07 DIAGNOSIS — M150 Primary generalized (osteo)arthritis: Secondary | ICD-10-CM | POA: Diagnosis not present

## 2023-02-07 DIAGNOSIS — M48061 Spinal stenosis, lumbar region without neurogenic claudication: Secondary | ICD-10-CM | POA: Diagnosis not present

## 2023-02-07 DIAGNOSIS — I129 Hypertensive chronic kidney disease with stage 1 through stage 4 chronic kidney disease, or unspecified chronic kidney disease: Secondary | ICD-10-CM | POA: Diagnosis not present

## 2023-02-07 DIAGNOSIS — I69351 Hemiplegia and hemiparesis following cerebral infarction affecting right dominant side: Secondary | ICD-10-CM | POA: Diagnosis not present

## 2023-02-08 DIAGNOSIS — Z8673 Personal history of transient ischemic attack (TIA), and cerebral infarction without residual deficits: Secondary | ICD-10-CM | POA: Diagnosis not present

## 2023-02-08 DIAGNOSIS — R2681 Unsteadiness on feet: Secondary | ICD-10-CM | POA: Diagnosis not present

## 2023-02-08 DIAGNOSIS — W010XXA Fall on same level from slipping, tripping and stumbling without subsequent striking against object, initial encounter: Secondary | ICD-10-CM | POA: Diagnosis not present

## 2023-02-08 DIAGNOSIS — G3184 Mild cognitive impairment, so stated: Secondary | ICD-10-CM | POA: Diagnosis not present

## 2023-02-08 DIAGNOSIS — D6869 Other thrombophilia: Secondary | ICD-10-CM | POA: Diagnosis not present

## 2023-02-08 DIAGNOSIS — I480 Paroxysmal atrial fibrillation: Secondary | ICD-10-CM | POA: Diagnosis not present

## 2023-02-08 DIAGNOSIS — R269 Unspecified abnormalities of gait and mobility: Secondary | ICD-10-CM | POA: Diagnosis not present

## 2023-02-10 DIAGNOSIS — D6869 Other thrombophilia: Secondary | ICD-10-CM | POA: Diagnosis not present

## 2023-02-10 DIAGNOSIS — J439 Emphysema, unspecified: Secondary | ICD-10-CM | POA: Diagnosis not present

## 2023-02-10 DIAGNOSIS — Z87440 Personal history of urinary (tract) infections: Secondary | ICD-10-CM | POA: Diagnosis not present

## 2023-02-10 DIAGNOSIS — I700 Atherosclerosis of aorta: Secondary | ICD-10-CM | POA: Diagnosis not present

## 2023-02-10 DIAGNOSIS — J849 Interstitial pulmonary disease, unspecified: Secondary | ICD-10-CM | POA: Diagnosis not present

## 2023-02-10 DIAGNOSIS — I480 Paroxysmal atrial fibrillation: Secondary | ICD-10-CM | POA: Diagnosis not present

## 2023-02-10 DIAGNOSIS — G319 Degenerative disease of nervous system, unspecified: Secondary | ICD-10-CM | POA: Diagnosis not present

## 2023-02-10 DIAGNOSIS — M5431 Sciatica, right side: Secondary | ICD-10-CM | POA: Diagnosis not present

## 2023-02-10 DIAGNOSIS — R1310 Dysphagia, unspecified: Secondary | ICD-10-CM | POA: Diagnosis not present

## 2023-02-10 DIAGNOSIS — I69351 Hemiplegia and hemiparesis following cerebral infarction affecting right dominant side: Secondary | ICD-10-CM | POA: Diagnosis not present

## 2023-02-10 DIAGNOSIS — N1831 Chronic kidney disease, stage 3a: Secondary | ICD-10-CM | POA: Diagnosis not present

## 2023-02-10 DIAGNOSIS — G9341 Metabolic encephalopathy: Secondary | ICD-10-CM | POA: Diagnosis not present

## 2023-02-10 DIAGNOSIS — Z7952 Long term (current) use of systemic steroids: Secondary | ICD-10-CM | POA: Diagnosis not present

## 2023-02-10 DIAGNOSIS — I69320 Aphasia following cerebral infarction: Secondary | ICD-10-CM | POA: Diagnosis not present

## 2023-02-10 DIAGNOSIS — D692 Other nonthrombocytopenic purpura: Secondary | ICD-10-CM | POA: Diagnosis not present

## 2023-02-10 DIAGNOSIS — E871 Hypo-osmolality and hyponatremia: Secondary | ICD-10-CM | POA: Diagnosis not present

## 2023-02-10 DIAGNOSIS — M150 Primary generalized (osteo)arthritis: Secondary | ICD-10-CM | POA: Diagnosis not present

## 2023-02-10 DIAGNOSIS — I129 Hypertensive chronic kidney disease with stage 1 through stage 4 chronic kidney disease, or unspecified chronic kidney disease: Secondary | ICD-10-CM | POA: Diagnosis not present

## 2023-02-10 DIAGNOSIS — M5416 Radiculopathy, lumbar region: Secondary | ICD-10-CM | POA: Diagnosis not present

## 2023-02-10 DIAGNOSIS — E785 Hyperlipidemia, unspecified: Secondary | ICD-10-CM | POA: Diagnosis not present

## 2023-02-10 DIAGNOSIS — G4733 Obstructive sleep apnea (adult) (pediatric): Secondary | ICD-10-CM | POA: Diagnosis not present

## 2023-02-10 DIAGNOSIS — M48061 Spinal stenosis, lumbar region without neurogenic claudication: Secondary | ICD-10-CM | POA: Diagnosis not present

## 2023-02-10 DIAGNOSIS — H3411 Central retinal artery occlusion, right eye: Secondary | ICD-10-CM | POA: Diagnosis not present

## 2023-02-10 DIAGNOSIS — E2740 Unspecified adrenocortical insufficiency: Secondary | ICD-10-CM | POA: Diagnosis not present

## 2023-02-10 DIAGNOSIS — D519 Vitamin B12 deficiency anemia, unspecified: Secondary | ICD-10-CM | POA: Diagnosis not present

## 2023-02-15 DIAGNOSIS — N1831 Chronic kidney disease, stage 3a: Secondary | ICD-10-CM | POA: Diagnosis not present

## 2023-02-15 DIAGNOSIS — I480 Paroxysmal atrial fibrillation: Secondary | ICD-10-CM | POA: Diagnosis not present

## 2023-02-15 DIAGNOSIS — M150 Primary generalized (osteo)arthritis: Secondary | ICD-10-CM | POA: Diagnosis not present

## 2023-02-15 DIAGNOSIS — M48061 Spinal stenosis, lumbar region without neurogenic claudication: Secondary | ICD-10-CM | POA: Diagnosis not present

## 2023-02-15 DIAGNOSIS — I69351 Hemiplegia and hemiparesis following cerebral infarction affecting right dominant side: Secondary | ICD-10-CM | POA: Diagnosis not present

## 2023-02-15 DIAGNOSIS — I129 Hypertensive chronic kidney disease with stage 1 through stage 4 chronic kidney disease, or unspecified chronic kidney disease: Secondary | ICD-10-CM | POA: Diagnosis not present

## 2023-02-22 DIAGNOSIS — I129 Hypertensive chronic kidney disease with stage 1 through stage 4 chronic kidney disease, or unspecified chronic kidney disease: Secondary | ICD-10-CM | POA: Diagnosis not present

## 2023-02-22 DIAGNOSIS — I480 Paroxysmal atrial fibrillation: Secondary | ICD-10-CM | POA: Diagnosis not present

## 2023-02-22 DIAGNOSIS — I69351 Hemiplegia and hemiparesis following cerebral infarction affecting right dominant side: Secondary | ICD-10-CM | POA: Diagnosis not present

## 2023-02-22 DIAGNOSIS — M150 Primary generalized (osteo)arthritis: Secondary | ICD-10-CM | POA: Diagnosis not present

## 2023-02-22 DIAGNOSIS — M48061 Spinal stenosis, lumbar region without neurogenic claudication: Secondary | ICD-10-CM | POA: Diagnosis not present

## 2023-02-22 DIAGNOSIS — N1831 Chronic kidney disease, stage 3a: Secondary | ICD-10-CM | POA: Diagnosis not present

## 2023-02-23 ENCOUNTER — Encounter: Payer: Self-pay | Admitting: Internal Medicine

## 2023-02-23 ENCOUNTER — Ambulatory Visit (INDEPENDENT_AMBULATORY_CARE_PROVIDER_SITE_OTHER): Payer: Medicare Other | Admitting: Internal Medicine

## 2023-02-23 VITALS — BP 122/78 | HR 96 | Ht 60.0 in | Wt 152.0 lb

## 2023-02-23 DIAGNOSIS — E2740 Unspecified adrenocortical insufficiency: Secondary | ICD-10-CM | POA: Diagnosis not present

## 2023-02-23 MED ORDER — HYDROCORTISONE 5 MG PO TABS: 5.0000 mg | ORAL_TABLET | ORAL | 2 refills | Status: DC

## 2023-02-23 NOTE — Patient Instructions (Addendum)
Please obtain a medical alert bracelet for " Adrenal Insufficiency"   Hydrocortisone 5 mg, take 3 tablets every morning with breakfast and 1 tablet in the afternoon    ADRENAL INSUFFICIENCY SICK DAY RULES:  Should you face an extreme emotional or physical stress such as trauma, surgery or acute illness, this will require extra steroid coverage so that the body can meet that stress.   Without increasing the steroid dose you may experience severe weakness, headache, dizziness, nausea and vomiting and possibly a more serious deterioration in health.  Typically the dose of steroids will only need to be increased for a couple of days if you have an illness that is transient and managed in the community.   If you are unable to take/absorb an increased dose of steroids orally because of vomiting or diarrhea, you will urgently require steroid injections and should present to an Emergency Department.  The general advice for any serious illness is as follows: Double the normal daily steroid dose for up to 3 days if you have a temperature of more than 37.50C (99.50F) with signs of sickness, or severe emotional or physical distress Contact your primary care doctor and Endocrinologist if the illness worsens or it lasts for more than 3 days.  In cases of severe illness, urgent medical assistance should be promptly sought. If you experience vomiting/diarrhea or are unable to take steroids by mouth, please administer the Hydrocortisone injection kit and seek urgent medical help.

## 2023-02-23 NOTE — Progress Notes (Signed)
Name: [name]  MRN/ DOB: [id number], August 25, 1931    Age/ Sex: 89 y.o., female    PCP: Rodrigo Ran, MD   Reason for Endocrinology Evaluation: Adrenal insufficiency     Date of Initial Endocrinology Evaluation: 10/19/2022    HPI: [name] is a 89 y.o. female with a past medical history of A-fib, OSA, interstitial lung disease. The patient presented for initial endocrinology clinic visit on 10/19/2022  for consultative assistance with her adrenal insufficiency.   The patient has been referred here for further evaluation of adrenal insufficiency  [name] was started on glucocorticoid therapy for back pain approximately a year and a half ago  There has been at least 3 different attempts to weaning her off glucocorticoids but she ends up with asthenia requiring ED visits.   Her back issues have resolved, she does use the walker occasionally  On her initial visit to our clinic she was on prednisone, I switched her to hydrocortisone   ACTH undetectable 09/2022  SUBJECTIVE:    Today (02/23/23):  [name] is here for follow-up on secondary adrenal insufficiency. She is accompanied by her daughter today    She was evaluated in the ED for a fall 02/03/2023, no fractures Weight stable  Denies nausea or vomiting  Denies constipation or diarrhea  Low energy stable  Denies LE edema  No sleep issues  Stable joint pains , has OA of thumbs    hydrocortisone 5 mg, 3 tablets every morning with breakfast and 1 tablet in the afternoon between 2-4 PM      HISTORY:  Past Medical History:  Past Medical History:  Diagnosis Date   Atrial fibrillation (HCC)    Prior treatment with amiodarone, held sinus rhythm and drug stopped   Atrial flutter (HCC)    Status post ablation prior to  2009   Chest pain    Nuclear, 2004, no scar or ischemia, EF 77%   Ejection fraction    EF 70%, nuclear,  2004  //      Fall    July, 2014   FUO (fever of unknown origin)  11/15/2021   H/O bladder repair surgery    OSA (obstructive sleep apnea)    Right Achilles tendinitis    Sciatica    Past Surgical History:  Past Surgical History:  Procedure Laterality Date   IR CT HEAD LTD  11/14/2019   IR PERCUTANEOUS ART THROMBECTOMY/INFUSION INTRACRANIAL INC DIAG ANGIO  11/14/2019   IR RADIOLOGIST EVAL & MGMT  02/20/2020   IR US GUIDE VASC ACCESS RIGHT  11/14/2019   no surgical hx     RADIOLOGY WITH ANESTHESIA N/A 11/13/2019   Procedure: IR WITH ANESTHESIA;  Surgeon: Julieanne Cotton, MD;  Location: [facility] OR;  Service: Radiology;  Laterality: N/A;    Social History:  reports that she has quit smoking. Her smoking use included cigarettes. She has a 10 pack-year smoking history. She has never used smokeless tobacco. She reports current alcohol use. She reports that she does not use drugs. Family History: family history includes Cancer in her father and mother; Heart disease in her brother.   HOME MEDICATIONS: Allergies as of 02/23/2023       Reactions   Bee Venom Anaphylaxis   Mixed Vespid Venom Anaphylaxis   Wasp Venom Anaphylaxis   "White-faced" wasps   Yellow Jacket Venom Anaphylaxis   Codeine Nausea Only, Other (See Comments)   Felt faint, also   Sulfa Antibiotics Itching, Nausea  Only   Erythromycin Nausea And Vomiting        Medication List        Accurate as of February 23, 2023  2:01 PM. If you have any questions, ask your nurse or doctor.          Abrysvo 120 MCG/0.5ML injection Generic drug: RSV bivalent vaccine   AeroChamber Z-Stat Plus Chambr Misc See admin instructions.   albuterol 108 (90 Base) MCG/ACT inhaler Commonly known as: VENTOLIN HFA Inhale 1-2 puffs into the lungs every 6 (six) hours as needed. What changed: reasons to take this   amLODipine 2.5 MG tablet Commonly known as: NORVASC Take 1 tablet (2.5 mg total) by mouth daily.   CRANBERRY PO Take 650 mg by mouth in the morning.   Eliquis 5 MG Tabs tablet Generic  drug: apixaban TAKE 1 TABLET TWICE A DAY What changed: when to take this   estradiol 0.1 MG/GM vaginal cream Commonly known as: ESTRACE Place vaginally.   famotidine 40 MG tablet Commonly known as: PEPCID Take 40 mg by mouth daily before supper.   galantamine 8 MG 24 hr capsule Commonly known as: RAZADYNE ER Take 8 mg by mouth every morning.   Gemtesa 75 MG Tabs Generic drug: Vibegron Take 75 mg by mouth at bedtime.   hydrocortisone 5 MG tablet Commonly known as: CORTEF Take 1 tablet (5 mg total) by mouth as directed. 3 tablets in the morning and 1 tablet between 2-4 pm   ipratropium-albuterol 0.5-2.5 (3) MG/3ML Soln Commonly known as: DUONEB Take 3 mLs by nebulization every 6 (six) hours as needed. What changed: when to take this   [facility] Colon Health Caps Take 1 capsule by mouth every evening.   predniSONE 5 MG tablet Commonly known as: DELTASONE Take 5 mg by mouth daily with breakfast.   SENIOR VITES PO Take 1 tablet by mouth daily with breakfast.   trimethoprim 100 MG tablet Commonly known as: TRIMPEX Take 100 mg by mouth daily.   vitamin D3 50 MCG (2000 UT) Caps Take 2,000 Units by mouth in the morning.          REVIEW OF SYSTEMS: A comprehensive ROS was conducted with the patient and is negative except as per HPI    OBJECTIVE:  VS: BP 122/78 (BP Location: Right Arm, Patient Position: Sitting, Cuff Size: Small)   Pulse 96   Ht 5' (1.524 m)   Wt 152 lb (68.9 kg)   LMP  (LMP Unknown)   SpO2 98%   BMI 29.69 kg/m    Wt Readings from Last 3 Encounters:  02/23/23 152 lb (68.9 kg)  10/21/22 154 lb 1.6 oz (69.9 kg)  10/19/22 151 lb (68.5 kg)   Body surface area is 1.71 meters squared.   EXAM: General: Pt appears well and is in NAD  Neck:  Thyroid: Thyroid size normal.  No goiter or nodules appreciated.   Lungs: Clear with good BS bilat   Heart: Auscultation: RRR.  Abdomen: Soft, nontender  Extremities:  BL LE: No pretibial edema    Mental Status: Judgment, insight: Intact Orientation: Oriented to time, place, and person Mood and affect: No depression, anxiety, or agitation     DATA REVIEWED:    Latest Reference Range & Units 10/19/22 14:49  Sodium 135 - 145 mEq/L 141  Potassium 3.5 - 5.1 mEq/L 4.7  Chloride 96 - 112 mEq/L 104  CO2 19 - 32 mEq/L 26  Glucose 70 - 99 mg/dL 191 (H)  BUN 6 -  23 mg/dL 16  Creatinine 5.78 - 4.69 mg/dL 6.29 (H)  Calcium 8.4 - 10.5 mg/dL 9.7  GFR >52.84 mL/min 37.44 (L)    Latest Reference Range & Units 10/19/22 14:49  Cortisol, Plasma ug/dL 5.5  Glucose 70 - 99 mg/dL 132 (H)  TSH 4.40 - 1.02 uIU/mL 2.78  Old records , labs and images have been reviewed.    ASSESSMENT/PLAN/RECOMMENDATIONS:   Secondary adrenal insufficiency:  -This has been attributed to chronic glucocorticoid therapy for back pain -ACTH undetectable 09/2022 -She is tolerating hydrocortisone well, will check ACTH today -Patient advised to obtain a medical alert bracelet -Sick stable discussed    Medications :  Continue hydrocortisone 5 mg, 3 tablets every morning with breakfast and 1 tablet in the afternoon between 2-4 PM  Follow-up in 6 months   I spent 25 minutes preparing to see the patient by review of recent labs, imaging and procedures, obtaining and reviewing separately obtained history, communicating with the patient/family or caregiver, ordering medications, tests or procedures, and documenting clinical information in the EHR including the differential Dx, treatment, and any further evaluation and other management    Signed electronically by: Lyndle Herrlich, MD  [facility] Endocrinology  [facility] [address] [place] [place], [place] [address] Phone: [phone] FAX: [phone]   CC: Rodrigo Ran, MD [address] [place] [address] Phone: [phone] Fax: [phone]   Return to Endocrinology clinic as below: Future Appointments  Date Time  Provider Department Center  03/01/2023  9:30 AM Little, Karma Lew, RN [facility] None  07/27/2023  1:15 PM Ihor Austin, NP [facility] None

## 2023-02-25 ENCOUNTER — Encounter: Payer: Self-pay | Admitting: Internal Medicine

## 2023-02-25 LAB — ACTH: C206 ACTH: <5 pg/mL — ABNORMAL LOW (ref 6–50)

## 2023-02-28 DIAGNOSIS — M150 Primary generalized (osteo)arthritis: Secondary | ICD-10-CM | POA: Diagnosis not present

## 2023-02-28 DIAGNOSIS — I129 Hypertensive chronic kidney disease with stage 1 through stage 4 chronic kidney disease, or unspecified chronic kidney disease: Secondary | ICD-10-CM | POA: Diagnosis not present

## 2023-02-28 DIAGNOSIS — N1831 Chronic kidney disease, stage 3a: Secondary | ICD-10-CM | POA: Diagnosis not present

## 2023-02-28 DIAGNOSIS — I480 Paroxysmal atrial fibrillation: Secondary | ICD-10-CM | POA: Diagnosis not present

## 2023-02-28 DIAGNOSIS — I69351 Hemiplegia and hemiparesis following cerebral infarction affecting right dominant side: Secondary | ICD-10-CM | POA: Diagnosis not present

## 2023-02-28 DIAGNOSIS — M48061 Spinal stenosis, lumbar region without neurogenic claudication: Secondary | ICD-10-CM | POA: Diagnosis not present

## 2023-03-01 ENCOUNTER — Ambulatory Visit: Payer: Self-pay

## 2023-03-01 NOTE — Patient Instructions (Signed)
Visit Information  Thank you for taking time to visit with me today. Please don't hesitate to contact me if I can be of assistance to you.   Following are the goals we discussed today:   Goals Addressed             This Visit's Progress    COMPLETED: RN CC Activities: no further follow up needed       Care Coordination Interventions: Completed successful outbound call with daughter [name] Reviewed and discussed with daughter, patient experienced a recent fall after slipping on the floor in her home while wearing socks Reviewed and discussed patient was found to have a small abrasion on her head but otherwise was determined to be stable and was discharged to her home with orders for PT, she completed last week Provided written and verbal education re: potential causes of falls and Fall prevention strategies Assessed for falls since last encounter Advised patient to discuss on going nurse care coordination needs  with provider Advised daughter of nurse care coordination case closure per PCP provider request and directed daughter to notify PCP of ongoing needs or concerns and she verbalizes understanding         COMPLETED: To clarify use of prescribed antibiotic for reoccurring UTI       Care Coordination Interventions: Completed successful outbound call with daughter [name] Advised daughter of nurse care coordination closure per PCP provider request     COMPLETED: To completed lung CT and use administer breathing treatments as directed       Care Coordination Interventions: Completed successful outbound call with daughter [name] Advised daughter of nurse care coordination closure per PCP provider request     COMPLETED: To follow up with Endocrinology evaluation of diabetes and other endocrine disorder       Care Coordination Interventions: Completed successful outbound call with daughter [name] Advised daughter of nurse care coordination  closure per PCP provider request         If you are experiencing a Mental Health or Behavioral Health Crisis or need someone to talk to, please call 1-800-273-TALK (toll free, 24 hour hotline)  Patient verbalizes understanding of instructions and care plan provided today and agrees to view in [place]. Active MyChart status and patient understanding of how to access instructions and care plan via MyChart confirmed with patient.     Delsa Sale RN BSN CCM [place]  [facility], [facility] Nurse Care Coordinator  Direct Dial: [phone] Website: [email or web address]

## 2023-03-01 NOTE — Patient Outreach (Signed)
  Care Coordination   Follow Up Visit Note   03/01/2023 Name: [name] MRN: [id number] DOB: [birth year]  [name] is a 89 y.o. year old female who sees Shayne Anes, MD for primary care. I spoke with  [name] by phone today.  What matters to the patients health and wellness today?  Patient's daughter may contact the patient's PCP to request additional physical therapy.    Goals Addressed             This Visit's Progress    COMPLETED: RN CC Activities: no further follow up needed       Care Coordination Interventions: Completed successful outbound call with daughter [name] Reviewed and discussed with daughter, patient experienced a recent fall after slipping on the floor in her home while wearing socks Reviewed and discussed patient was found to have a small abrasion on her head but otherwise was determined to be stable and was discharged to her home with orders for PT, she completed last week Provided written and verbal education re: potential causes of falls and Fall prevention strategies Assessed for falls since last encounter Advised patient to discuss on going nurse care coordination needs  with provider Advised daughter of nurse care coordination case closure per PCP provider request and directed daughter to notify PCP of ongoing needs or concerns and she verbalizes understanding         COMPLETED: To clarify use of prescribed antibiotic for reoccurring UTI       Care Coordination Interventions: Completed successful outbound call with daughter [name] Advised daughter of nurse care coordination closure per PCP provider request     COMPLETED: To completed lung CT and use administer breathing treatments as directed       Care Coordination Interventions: Completed successful outbound call with daughter [name] Advised daughter of nurse care coordination closure per PCP provider request     COMPLETED: To follow up with Endocrinology  evaluation of diabetes and other endocrine disorder       Care Coordination Interventions: Completed successful outbound call with daughter [name] Advised daughter of nurse care coordination closure per PCP provider request     Interventions Today    Flowsheet Row Most Recent Value  Chronic Disease   Chronic disease during today's visit Other  [s/p fall with head abrasion]  General Interventions   General Interventions Discussed/Reviewed General Interventions Discussed, General Interventions Reviewed, Doctor Visits  Doctor Visits Discussed/Reviewed Doctor Visits Discussed, Doctor Visits Reviewed, PCP  Exercise Interventions   Exercise Discussed/Reviewed Physical Activity, Exercise Reviewed, Exercise Discussed  Physical Activity Discussed/Reviewed Physical Activity Discussed, Physical Activity Reviewed, Types of exercise  Education Interventions   Education Provided Provided Education  Provided Verbal Education On When to see the doctor, Exercise  Safety Interventions   Safety Discussed/Reviewed Home Safety, Fall Risk, Safety Reviewed, Safety Discussed  Home Safety Contact provider for referral to PT/OT          SDOH assessments and interventions completed:  No     Care Coordination Interventions:  Yes, provided   Follow up plan: No further intervention required.   Encounter Outcome:  Patient Visit Completed

## 2023-03-08 ENCOUNTER — Other Ambulatory Visit: Payer: Self-pay | Admitting: Cardiology

## 2023-03-08 DIAGNOSIS — I480 Paroxysmal atrial fibrillation: Secondary | ICD-10-CM

## 2023-03-08 NOTE — Telephone Encounter (Signed)
Prescription refill request for Eliquis received. Indication: PAF Last office visit: 06/28/22  Michele Rockers MD Scr: 1.03 on 10/21/22  Epic Age: 89 Weight: 69.9kg  Based on above findings Eliquis 5mg  twice daily is the appropriate dose.  Refill approved.

## 2023-04-06 ENCOUNTER — Telehealth: Payer: Self-pay | Admitting: Adult Health

## 2023-04-06 ENCOUNTER — Other Ambulatory Visit: Payer: Self-pay | Admitting: *Deleted

## 2023-04-06 MED ORDER — IPRATROPIUM-ALBUTEROL 0.5-2.5 (3) MG/3ML IN SOLN: 3.0000 mL | Freq: Two times a day (BID) | RESPIRATORY_TRACT | 4 refills | Status: AC

## 2023-04-06 NOTE — Telephone Encounter (Signed)
 RX for Duoneb was sent in twice with no dirctions. Please resubmit.

## 2023-04-06 NOTE — Telephone Encounter (Signed)
 New prescription sent electronically to [facility].  Duoneb (Ipratropium-Albuterol), 3ml twice daily.  Nothing further needed.

## 2023-04-26 DIAGNOSIS — D485 Neoplasm of uncertain behavior of skin: Secondary | ICD-10-CM | POA: Diagnosis not present

## 2023-04-26 DIAGNOSIS — B079 Viral wart, unspecified: Secondary | ICD-10-CM | POA: Diagnosis not present

## 2023-04-27 DIAGNOSIS — J849 Interstitial pulmonary disease, unspecified: Secondary | ICD-10-CM | POA: Diagnosis not present

## 2023-04-27 DIAGNOSIS — G3184 Mild cognitive impairment, so stated: Secondary | ICD-10-CM | POA: Diagnosis not present

## 2023-04-27 DIAGNOSIS — N3020 Other chronic cystitis without hematuria: Secondary | ICD-10-CM | POA: Diagnosis not present

## 2023-04-27 DIAGNOSIS — R1310 Dysphagia, unspecified: Secondary | ICD-10-CM | POA: Diagnosis not present

## 2023-04-27 DIAGNOSIS — I480 Paroxysmal atrial fibrillation: Secondary | ICD-10-CM | POA: Diagnosis not present

## 2023-04-27 DIAGNOSIS — R2681 Unsteadiness on feet: Secondary | ICD-10-CM | POA: Diagnosis not present

## 2023-04-27 DIAGNOSIS — N3941 Urge incontinence: Secondary | ICD-10-CM | POA: Diagnosis not present

## 2023-04-27 DIAGNOSIS — M6281 Muscle weakness (generalized): Secondary | ICD-10-CM | POA: Diagnosis not present

## 2023-04-27 DIAGNOSIS — Z8673 Personal history of transient ischemic attack (TIA), and cerebral infarction without residual deficits: Secondary | ICD-10-CM | POA: Diagnosis not present

## 2023-04-28 DIAGNOSIS — E2740 Unspecified adrenocortical insufficiency: Secondary | ICD-10-CM | POA: Diagnosis not present

## 2023-04-28 DIAGNOSIS — D692 Other nonthrombocytopenic purpura: Secondary | ICD-10-CM | POA: Diagnosis not present

## 2023-04-28 DIAGNOSIS — E785 Hyperlipidemia, unspecified: Secondary | ICD-10-CM | POA: Diagnosis not present

## 2023-04-28 DIAGNOSIS — M5430 Sciatica, unspecified side: Secondary | ICD-10-CM | POA: Diagnosis not present

## 2023-04-28 DIAGNOSIS — N1831 Chronic kidney disease, stage 3a: Secondary | ICD-10-CM | POA: Diagnosis not present

## 2023-04-28 DIAGNOSIS — D6869 Other thrombophilia: Secondary | ICD-10-CM | POA: Diagnosis not present

## 2023-04-28 DIAGNOSIS — Z7901 Long term (current) use of anticoagulants: Secondary | ICD-10-CM | POA: Diagnosis not present

## 2023-04-28 DIAGNOSIS — Z8616 Personal history of COVID-19: Secondary | ICD-10-CM | POA: Diagnosis not present

## 2023-04-28 DIAGNOSIS — R2681 Unsteadiness on feet: Secondary | ICD-10-CM | POA: Diagnosis not present

## 2023-04-28 DIAGNOSIS — I480 Paroxysmal atrial fibrillation: Secondary | ICD-10-CM | POA: Diagnosis not present

## 2023-04-28 DIAGNOSIS — J439 Emphysema, unspecified: Secondary | ICD-10-CM | POA: Diagnosis not present

## 2023-04-28 DIAGNOSIS — Z87440 Personal history of urinary (tract) infections: Secondary | ICD-10-CM | POA: Diagnosis not present

## 2023-04-28 DIAGNOSIS — D510 Vitamin B12 deficiency anemia due to intrinsic factor deficiency: Secondary | ICD-10-CM | POA: Diagnosis not present

## 2023-04-28 DIAGNOSIS — G3184 Mild cognitive impairment, so stated: Secondary | ICD-10-CM | POA: Diagnosis not present

## 2023-04-28 DIAGNOSIS — Z7952 Long term (current) use of systemic steroids: Secondary | ICD-10-CM | POA: Diagnosis not present

## 2023-04-28 DIAGNOSIS — I700 Atherosclerosis of aorta: Secondary | ICD-10-CM | POA: Diagnosis not present

## 2023-04-28 DIAGNOSIS — M5416 Radiculopathy, lumbar region: Secondary | ICD-10-CM | POA: Diagnosis not present

## 2023-04-28 DIAGNOSIS — M48061 Spinal stenosis, lumbar region without neurogenic claudication: Secondary | ICD-10-CM | POA: Diagnosis not present

## 2023-04-28 DIAGNOSIS — H3411 Central retinal artery occlusion, right eye: Secondary | ICD-10-CM | POA: Diagnosis not present

## 2023-04-28 DIAGNOSIS — R1310 Dysphagia, unspecified: Secondary | ICD-10-CM | POA: Diagnosis not present

## 2023-04-28 DIAGNOSIS — J310 Chronic rhinitis: Secondary | ICD-10-CM | POA: Diagnosis not present

## 2023-04-28 DIAGNOSIS — Z8673 Personal history of transient ischemic attack (TIA), and cerebral infarction without residual deficits: Secondary | ICD-10-CM | POA: Diagnosis not present

## 2023-04-28 DIAGNOSIS — J849 Interstitial pulmonary disease, unspecified: Secondary | ICD-10-CM | POA: Diagnosis not present

## 2023-04-28 DIAGNOSIS — M159 Polyosteoarthritis, unspecified: Secondary | ICD-10-CM | POA: Diagnosis not present

## 2023-04-28 DIAGNOSIS — G4733 Obstructive sleep apnea (adult) (pediatric): Secondary | ICD-10-CM | POA: Diagnosis not present

## 2023-05-05 DIAGNOSIS — N1831 Chronic kidney disease, stage 3a: Secondary | ICD-10-CM | POA: Diagnosis not present

## 2023-05-05 DIAGNOSIS — J439 Emphysema, unspecified: Secondary | ICD-10-CM | POA: Diagnosis not present

## 2023-05-05 DIAGNOSIS — J849 Interstitial pulmonary disease, unspecified: Secondary | ICD-10-CM | POA: Diagnosis not present

## 2023-05-05 DIAGNOSIS — I480 Paroxysmal atrial fibrillation: Secondary | ICD-10-CM | POA: Diagnosis not present

## 2023-05-05 DIAGNOSIS — I700 Atherosclerosis of aorta: Secondary | ICD-10-CM | POA: Diagnosis not present

## 2023-05-05 DIAGNOSIS — G3184 Mild cognitive impairment, so stated: Secondary | ICD-10-CM | POA: Diagnosis not present

## 2023-05-06 DIAGNOSIS — N1831 Chronic kidney disease, stage 3a: Secondary | ICD-10-CM | POA: Diagnosis not present

## 2023-05-06 DIAGNOSIS — I700 Atherosclerosis of aorta: Secondary | ICD-10-CM | POA: Diagnosis not present

## 2023-05-06 DIAGNOSIS — J439 Emphysema, unspecified: Secondary | ICD-10-CM | POA: Diagnosis not present

## 2023-05-06 DIAGNOSIS — G3184 Mild cognitive impairment, so stated: Secondary | ICD-10-CM | POA: Diagnosis not present

## 2023-05-06 DIAGNOSIS — J849 Interstitial pulmonary disease, unspecified: Secondary | ICD-10-CM | POA: Diagnosis not present

## 2023-05-06 DIAGNOSIS — I480 Paroxysmal atrial fibrillation: Secondary | ICD-10-CM | POA: Diagnosis not present

## 2023-05-09 DIAGNOSIS — I700 Atherosclerosis of aorta: Secondary | ICD-10-CM | POA: Diagnosis not present

## 2023-05-09 DIAGNOSIS — G3184 Mild cognitive impairment, so stated: Secondary | ICD-10-CM | POA: Diagnosis not present

## 2023-05-09 DIAGNOSIS — N1831 Chronic kidney disease, stage 3a: Secondary | ICD-10-CM | POA: Diagnosis not present

## 2023-05-09 DIAGNOSIS — J849 Interstitial pulmonary disease, unspecified: Secondary | ICD-10-CM | POA: Diagnosis not present

## 2023-05-09 DIAGNOSIS — J439 Emphysema, unspecified: Secondary | ICD-10-CM | POA: Diagnosis not present

## 2023-05-09 DIAGNOSIS — I480 Paroxysmal atrial fibrillation: Secondary | ICD-10-CM | POA: Diagnosis not present

## 2023-05-11 DIAGNOSIS — J849 Interstitial pulmonary disease, unspecified: Secondary | ICD-10-CM | POA: Diagnosis not present

## 2023-05-11 DIAGNOSIS — I700 Atherosclerosis of aorta: Secondary | ICD-10-CM | POA: Diagnosis not present

## 2023-05-11 DIAGNOSIS — N1831 Chronic kidney disease, stage 3a: Secondary | ICD-10-CM | POA: Diagnosis not present

## 2023-05-11 DIAGNOSIS — J439 Emphysema, unspecified: Secondary | ICD-10-CM | POA: Diagnosis not present

## 2023-05-11 DIAGNOSIS — I480 Paroxysmal atrial fibrillation: Secondary | ICD-10-CM | POA: Diagnosis not present

## 2023-05-11 DIAGNOSIS — G3184 Mild cognitive impairment, so stated: Secondary | ICD-10-CM | POA: Diagnosis not present

## 2023-05-17 DIAGNOSIS — J849 Interstitial pulmonary disease, unspecified: Secondary | ICD-10-CM | POA: Diagnosis not present

## 2023-05-17 DIAGNOSIS — I700 Atherosclerosis of aorta: Secondary | ICD-10-CM | POA: Diagnosis not present

## 2023-05-17 DIAGNOSIS — G3184 Mild cognitive impairment, so stated: Secondary | ICD-10-CM | POA: Diagnosis not present

## 2023-05-17 DIAGNOSIS — N1831 Chronic kidney disease, stage 3a: Secondary | ICD-10-CM | POA: Diagnosis not present

## 2023-05-17 DIAGNOSIS — I480 Paroxysmal atrial fibrillation: Secondary | ICD-10-CM | POA: Diagnosis not present

## 2023-05-17 DIAGNOSIS — J439 Emphysema, unspecified: Secondary | ICD-10-CM | POA: Diagnosis not present

## 2023-05-19 DIAGNOSIS — J849 Interstitial pulmonary disease, unspecified: Secondary | ICD-10-CM | POA: Diagnosis not present

## 2023-05-19 DIAGNOSIS — G3184 Mild cognitive impairment, so stated: Secondary | ICD-10-CM | POA: Diagnosis not present

## 2023-05-19 DIAGNOSIS — J439 Emphysema, unspecified: Secondary | ICD-10-CM | POA: Diagnosis not present

## 2023-05-19 DIAGNOSIS — I480 Paroxysmal atrial fibrillation: Secondary | ICD-10-CM | POA: Diagnosis not present

## 2023-05-19 DIAGNOSIS — N1831 Chronic kidney disease, stage 3a: Secondary | ICD-10-CM | POA: Diagnosis not present

## 2023-05-19 DIAGNOSIS — I700 Atherosclerosis of aorta: Secondary | ICD-10-CM | POA: Diagnosis not present

## 2023-05-23 DIAGNOSIS — N1831 Chronic kidney disease, stage 3a: Secondary | ICD-10-CM | POA: Diagnosis not present

## 2023-05-23 DIAGNOSIS — I700 Atherosclerosis of aorta: Secondary | ICD-10-CM | POA: Diagnosis not present

## 2023-05-23 DIAGNOSIS — I480 Paroxysmal atrial fibrillation: Secondary | ICD-10-CM | POA: Diagnosis not present

## 2023-05-23 DIAGNOSIS — J439 Emphysema, unspecified: Secondary | ICD-10-CM | POA: Diagnosis not present

## 2023-05-23 DIAGNOSIS — J849 Interstitial pulmonary disease, unspecified: Secondary | ICD-10-CM | POA: Diagnosis not present

## 2023-05-23 DIAGNOSIS — G3184 Mild cognitive impairment, so stated: Secondary | ICD-10-CM | POA: Diagnosis not present

## 2023-05-26 DIAGNOSIS — E785 Hyperlipidemia, unspecified: Secondary | ICD-10-CM | POA: Diagnosis not present

## 2023-05-26 DIAGNOSIS — M159 Polyosteoarthritis, unspecified: Secondary | ICD-10-CM | POA: Diagnosis not present

## 2023-05-26 DIAGNOSIS — N1831 Chronic kidney disease, stage 3a: Secondary | ICD-10-CM | POA: Diagnosis not present

## 2023-05-26 DIAGNOSIS — I700 Atherosclerosis of aorta: Secondary | ICD-10-CM | POA: Diagnosis not present

## 2023-05-26 DIAGNOSIS — D6869 Other thrombophilia: Secondary | ICD-10-CM | POA: Diagnosis not present

## 2023-05-26 DIAGNOSIS — J439 Emphysema, unspecified: Secondary | ICD-10-CM | POA: Diagnosis not present

## 2023-05-26 DIAGNOSIS — J849 Interstitial pulmonary disease, unspecified: Secondary | ICD-10-CM | POA: Diagnosis not present

## 2023-05-26 DIAGNOSIS — G3184 Mild cognitive impairment, so stated: Secondary | ICD-10-CM | POA: Diagnosis not present

## 2023-05-26 DIAGNOSIS — D692 Other nonthrombocytopenic purpura: Secondary | ICD-10-CM | POA: Diagnosis not present

## 2023-05-26 DIAGNOSIS — E2740 Unspecified adrenocortical insufficiency: Secondary | ICD-10-CM | POA: Diagnosis not present

## 2023-05-26 DIAGNOSIS — D510 Vitamin B12 deficiency anemia due to intrinsic factor deficiency: Secondary | ICD-10-CM | POA: Diagnosis not present

## 2023-05-26 DIAGNOSIS — I480 Paroxysmal atrial fibrillation: Secondary | ICD-10-CM | POA: Diagnosis not present

## 2023-05-28 DIAGNOSIS — Z8616 Personal history of COVID-19: Secondary | ICD-10-CM | POA: Diagnosis not present

## 2023-05-28 DIAGNOSIS — G3184 Mild cognitive impairment, so stated: Secondary | ICD-10-CM | POA: Diagnosis not present

## 2023-05-28 DIAGNOSIS — I700 Atherosclerosis of aorta: Secondary | ICD-10-CM | POA: Diagnosis not present

## 2023-05-28 DIAGNOSIS — R2681 Unsteadiness on feet: Secondary | ICD-10-CM | POA: Diagnosis not present

## 2023-05-28 DIAGNOSIS — M159 Polyosteoarthritis, unspecified: Secondary | ICD-10-CM | POA: Diagnosis not present

## 2023-05-28 DIAGNOSIS — Z87440 Personal history of urinary (tract) infections: Secondary | ICD-10-CM | POA: Diagnosis not present

## 2023-05-28 DIAGNOSIS — D510 Vitamin B12 deficiency anemia due to intrinsic factor deficiency: Secondary | ICD-10-CM | POA: Diagnosis not present

## 2023-05-28 DIAGNOSIS — R1310 Dysphagia, unspecified: Secondary | ICD-10-CM | POA: Diagnosis not present

## 2023-05-28 DIAGNOSIS — I480 Paroxysmal atrial fibrillation: Secondary | ICD-10-CM | POA: Diagnosis not present

## 2023-05-28 DIAGNOSIS — Z8673 Personal history of transient ischemic attack (TIA), and cerebral infarction without residual deficits: Secondary | ICD-10-CM | POA: Diagnosis not present

## 2023-05-28 DIAGNOSIS — E2740 Unspecified adrenocortical insufficiency: Secondary | ICD-10-CM | POA: Diagnosis not present

## 2023-05-28 DIAGNOSIS — D6869 Other thrombophilia: Secondary | ICD-10-CM | POA: Diagnosis not present

## 2023-05-28 DIAGNOSIS — J310 Chronic rhinitis: Secondary | ICD-10-CM | POA: Diagnosis not present

## 2023-05-28 DIAGNOSIS — M5416 Radiculopathy, lumbar region: Secondary | ICD-10-CM | POA: Diagnosis not present

## 2023-05-28 DIAGNOSIS — J849 Interstitial pulmonary disease, unspecified: Secondary | ICD-10-CM | POA: Diagnosis not present

## 2023-05-28 DIAGNOSIS — M48061 Spinal stenosis, lumbar region without neurogenic claudication: Secondary | ICD-10-CM | POA: Diagnosis not present

## 2023-05-28 DIAGNOSIS — E785 Hyperlipidemia, unspecified: Secondary | ICD-10-CM | POA: Diagnosis not present

## 2023-05-28 DIAGNOSIS — J439 Emphysema, unspecified: Secondary | ICD-10-CM | POA: Diagnosis not present

## 2023-05-28 DIAGNOSIS — Z7952 Long term (current) use of systemic steroids: Secondary | ICD-10-CM | POA: Diagnosis not present

## 2023-05-28 DIAGNOSIS — G4733 Obstructive sleep apnea (adult) (pediatric): Secondary | ICD-10-CM | POA: Diagnosis not present

## 2023-05-28 DIAGNOSIS — H3411 Central retinal artery occlusion, right eye: Secondary | ICD-10-CM | POA: Diagnosis not present

## 2023-05-28 DIAGNOSIS — Z7901 Long term (current) use of anticoagulants: Secondary | ICD-10-CM | POA: Diagnosis not present

## 2023-05-28 DIAGNOSIS — N1831 Chronic kidney disease, stage 3a: Secondary | ICD-10-CM | POA: Diagnosis not present

## 2023-05-28 DIAGNOSIS — M5430 Sciatica, unspecified side: Secondary | ICD-10-CM | POA: Diagnosis not present

## 2023-05-28 DIAGNOSIS — D692 Other nonthrombocytopenic purpura: Secondary | ICD-10-CM | POA: Diagnosis not present

## 2023-05-30 DIAGNOSIS — J439 Emphysema, unspecified: Secondary | ICD-10-CM | POA: Diagnosis not present

## 2023-05-30 DIAGNOSIS — G3184 Mild cognitive impairment, so stated: Secondary | ICD-10-CM | POA: Diagnosis not present

## 2023-05-30 DIAGNOSIS — J849 Interstitial pulmonary disease, unspecified: Secondary | ICD-10-CM | POA: Diagnosis not present

## 2023-05-30 DIAGNOSIS — I700 Atherosclerosis of aorta: Secondary | ICD-10-CM | POA: Diagnosis not present

## 2023-05-30 DIAGNOSIS — N1831 Chronic kidney disease, stage 3a: Secondary | ICD-10-CM | POA: Diagnosis not present

## 2023-05-30 DIAGNOSIS — I480 Paroxysmal atrial fibrillation: Secondary | ICD-10-CM | POA: Diagnosis not present

## 2023-06-09 DIAGNOSIS — I480 Paroxysmal atrial fibrillation: Secondary | ICD-10-CM | POA: Diagnosis not present

## 2023-06-09 DIAGNOSIS — N1831 Chronic kidney disease, stage 3a: Secondary | ICD-10-CM | POA: Diagnosis not present

## 2023-06-09 DIAGNOSIS — G3184 Mild cognitive impairment, so stated: Secondary | ICD-10-CM | POA: Diagnosis not present

## 2023-06-09 DIAGNOSIS — I700 Atherosclerosis of aorta: Secondary | ICD-10-CM | POA: Diagnosis not present

## 2023-06-09 DIAGNOSIS — J849 Interstitial pulmonary disease, unspecified: Secondary | ICD-10-CM | POA: Diagnosis not present

## 2023-06-09 DIAGNOSIS — J439 Emphysema, unspecified: Secondary | ICD-10-CM | POA: Diagnosis not present

## 2023-06-10 DIAGNOSIS — G8929 Other chronic pain: Secondary | ICD-10-CM | POA: Diagnosis not present

## 2023-06-10 DIAGNOSIS — M25511 Pain in right shoulder: Secondary | ICD-10-CM | POA: Diagnosis not present

## 2023-06-10 DIAGNOSIS — Z8673 Personal history of transient ischemic attack (TIA), and cerebral infarction without residual deficits: Secondary | ICD-10-CM | POA: Diagnosis not present

## 2023-06-10 DIAGNOSIS — I480 Paroxysmal atrial fibrillation: Secondary | ICD-10-CM | POA: Diagnosis not present

## 2023-06-10 DIAGNOSIS — R2681 Unsteadiness on feet: Secondary | ICD-10-CM | POA: Diagnosis not present

## 2023-06-15 DIAGNOSIS — J439 Emphysema, unspecified: Secondary | ICD-10-CM | POA: Diagnosis not present

## 2023-06-15 DIAGNOSIS — G3184 Mild cognitive impairment, so stated: Secondary | ICD-10-CM | POA: Diagnosis not present

## 2023-06-15 DIAGNOSIS — N1831 Chronic kidney disease, stage 3a: Secondary | ICD-10-CM | POA: Diagnosis not present

## 2023-06-15 DIAGNOSIS — I480 Paroxysmal atrial fibrillation: Secondary | ICD-10-CM | POA: Diagnosis not present

## 2023-06-15 DIAGNOSIS — J849 Interstitial pulmonary disease, unspecified: Secondary | ICD-10-CM | POA: Diagnosis not present

## 2023-06-15 DIAGNOSIS — I700 Atherosclerosis of aorta: Secondary | ICD-10-CM | POA: Diagnosis not present

## 2023-07-11 DIAGNOSIS — Z85828 Personal history of other malignant neoplasm of skin: Secondary | ICD-10-CM | POA: Diagnosis not present

## 2023-07-11 DIAGNOSIS — L218 Other seborrheic dermatitis: Secondary | ICD-10-CM | POA: Diagnosis not present

## 2023-07-11 DIAGNOSIS — L720 Epidermal cyst: Secondary | ICD-10-CM | POA: Diagnosis not present

## 2023-07-11 DIAGNOSIS — D2262 Melanocytic nevi of left upper limb, including shoulder: Secondary | ICD-10-CM | POA: Diagnosis not present

## 2023-07-11 DIAGNOSIS — C44219 Basal cell carcinoma of skin of left ear and external auricular canal: Secondary | ICD-10-CM | POA: Diagnosis not present

## 2023-07-11 DIAGNOSIS — D1801 Hemangioma of skin and subcutaneous tissue: Secondary | ICD-10-CM | POA: Diagnosis not present

## 2023-07-11 DIAGNOSIS — L821 Other seborrheic keratosis: Secondary | ICD-10-CM | POA: Diagnosis not present

## 2023-07-11 DIAGNOSIS — D3613 Benign neoplasm of peripheral nerves and autonomic nervous system of lower limb, including hip: Secondary | ICD-10-CM | POA: Diagnosis not present

## 2023-07-11 DIAGNOSIS — D225 Melanocytic nevi of trunk: Secondary | ICD-10-CM | POA: Diagnosis not present

## 2023-07-11 DIAGNOSIS — L814 Other melanin hyperpigmentation: Secondary | ICD-10-CM | POA: Diagnosis not present

## 2023-07-14 DIAGNOSIS — I129 Hypertensive chronic kidney disease with stage 1 through stage 4 chronic kidney disease, or unspecified chronic kidney disease: Secondary | ICD-10-CM | POA: Diagnosis not present

## 2023-07-14 DIAGNOSIS — H6120 Impacted cerumen, unspecified ear: Secondary | ICD-10-CM | POA: Diagnosis not present

## 2023-07-14 DIAGNOSIS — J849 Interstitial pulmonary disease, unspecified: Secondary | ICD-10-CM | POA: Diagnosis not present

## 2023-07-14 DIAGNOSIS — R2681 Unsteadiness on feet: Secondary | ICD-10-CM | POA: Diagnosis not present

## 2023-07-14 DIAGNOSIS — N1831 Chronic kidney disease, stage 3a: Secondary | ICD-10-CM | POA: Diagnosis not present

## 2023-07-14 DIAGNOSIS — M25511 Pain in right shoulder: Secondary | ICD-10-CM | POA: Diagnosis not present

## 2023-07-14 DIAGNOSIS — Z91030 Bee allergy status: Secondary | ICD-10-CM | POA: Diagnosis not present

## 2023-07-14 DIAGNOSIS — R7301 Impaired fasting glucose: Secondary | ICD-10-CM | POA: Diagnosis not present

## 2023-07-14 DIAGNOSIS — G8929 Other chronic pain: Secondary | ICD-10-CM | POA: Diagnosis not present

## 2023-07-14 DIAGNOSIS — I69320 Aphasia following cerebral infarction: Secondary | ICD-10-CM | POA: Diagnosis not present

## 2023-07-14 DIAGNOSIS — W010XXA Fall on same level from slipping, tripping and stumbling without subsequent striking against object, initial encounter: Secondary | ICD-10-CM | POA: Diagnosis not present

## 2023-07-14 DIAGNOSIS — I480 Paroxysmal atrial fibrillation: Secondary | ICD-10-CM | POA: Diagnosis not present

## 2023-07-20 DIAGNOSIS — H3411 Central retinal artery occlusion, right eye: Secondary | ICD-10-CM | POA: Diagnosis not present

## 2023-07-20 DIAGNOSIS — M25511 Pain in right shoulder: Secondary | ICD-10-CM | POA: Diagnosis not present

## 2023-07-20 DIAGNOSIS — J310 Chronic rhinitis: Secondary | ICD-10-CM | POA: Diagnosis not present

## 2023-07-20 DIAGNOSIS — Z87891 Personal history of nicotine dependence: Secondary | ICD-10-CM | POA: Diagnosis not present

## 2023-07-20 DIAGNOSIS — D7589 Other specified diseases of blood and blood-forming organs: Secondary | ICD-10-CM | POA: Diagnosis not present

## 2023-07-20 DIAGNOSIS — G4733 Obstructive sleep apnea (adult) (pediatric): Secondary | ICD-10-CM | POA: Diagnosis not present

## 2023-07-20 DIAGNOSIS — E871 Hypo-osmolality and hyponatremia: Secondary | ICD-10-CM | POA: Diagnosis not present

## 2023-07-20 DIAGNOSIS — M48061 Spinal stenosis, lumbar region without neurogenic claudication: Secondary | ICD-10-CM | POA: Diagnosis not present

## 2023-07-20 DIAGNOSIS — J849 Interstitial pulmonary disease, unspecified: Secondary | ICD-10-CM | POA: Diagnosis not present

## 2023-07-20 DIAGNOSIS — M5416 Radiculopathy, lumbar region: Secondary | ICD-10-CM | POA: Diagnosis not present

## 2023-07-20 DIAGNOSIS — D619 Aplastic anemia, unspecified: Secondary | ICD-10-CM | POA: Diagnosis not present

## 2023-07-20 DIAGNOSIS — M5430 Sciatica, unspecified side: Secondary | ICD-10-CM | POA: Diagnosis not present

## 2023-07-20 DIAGNOSIS — D519 Vitamin B12 deficiency anemia, unspecified: Secondary | ICD-10-CM | POA: Diagnosis not present

## 2023-07-20 DIAGNOSIS — I129 Hypertensive chronic kidney disease with stage 1 through stage 4 chronic kidney disease, or unspecified chronic kidney disease: Secondary | ICD-10-CM | POA: Diagnosis not present

## 2023-07-20 DIAGNOSIS — D6869 Other thrombophilia: Secondary | ICD-10-CM | POA: Diagnosis not present

## 2023-07-20 DIAGNOSIS — E2740 Unspecified adrenocortical insufficiency: Secondary | ICD-10-CM | POA: Diagnosis not present

## 2023-07-20 DIAGNOSIS — G8929 Other chronic pain: Secondary | ICD-10-CM | POA: Diagnosis not present

## 2023-07-20 DIAGNOSIS — E786 Lipoprotein deficiency: Secondary | ICD-10-CM | POA: Diagnosis not present

## 2023-07-20 DIAGNOSIS — I69320 Aphasia following cerebral infarction: Secondary | ICD-10-CM | POA: Diagnosis not present

## 2023-07-20 DIAGNOSIS — M159 Polyosteoarthritis, unspecified: Secondary | ICD-10-CM | POA: Diagnosis not present

## 2023-07-20 DIAGNOSIS — I700 Atherosclerosis of aorta: Secondary | ICD-10-CM | POA: Diagnosis not present

## 2023-07-20 DIAGNOSIS — Z7952 Long term (current) use of systemic steroids: Secondary | ICD-10-CM | POA: Diagnosis not present

## 2023-07-20 DIAGNOSIS — D692 Other nonthrombocytopenic purpura: Secondary | ICD-10-CM | POA: Diagnosis not present

## 2023-07-20 DIAGNOSIS — N1831 Chronic kidney disease, stage 3a: Secondary | ICD-10-CM | POA: Diagnosis not present

## 2023-07-20 DIAGNOSIS — I480 Paroxysmal atrial fibrillation: Secondary | ICD-10-CM | POA: Diagnosis not present

## 2023-07-26 DIAGNOSIS — I700 Atherosclerosis of aorta: Secondary | ICD-10-CM | POA: Diagnosis not present

## 2023-07-26 DIAGNOSIS — I129 Hypertensive chronic kidney disease with stage 1 through stage 4 chronic kidney disease, or unspecified chronic kidney disease: Secondary | ICD-10-CM | POA: Diagnosis not present

## 2023-07-26 DIAGNOSIS — I480 Paroxysmal atrial fibrillation: Secondary | ICD-10-CM | POA: Diagnosis not present

## 2023-07-26 DIAGNOSIS — G8929 Other chronic pain: Secondary | ICD-10-CM | POA: Diagnosis not present

## 2023-07-26 DIAGNOSIS — M25511 Pain in right shoulder: Secondary | ICD-10-CM | POA: Diagnosis not present

## 2023-07-26 DIAGNOSIS — N1831 Chronic kidney disease, stage 3a: Secondary | ICD-10-CM | POA: Diagnosis not present

## 2023-07-27 ENCOUNTER — Ambulatory Visit: Payer: Medicare Other | Admitting: Adult Health

## 2023-07-28 DIAGNOSIS — H903 Sensorineural hearing loss, bilateral: Secondary | ICD-10-CM | POA: Diagnosis not present

## 2023-07-28 DIAGNOSIS — H6123 Impacted cerumen, bilateral: Secondary | ICD-10-CM | POA: Diagnosis not present

## 2023-07-28 DIAGNOSIS — H900 Conductive hearing loss, bilateral: Secondary | ICD-10-CM | POA: Diagnosis not present

## 2023-08-01 DIAGNOSIS — G8929 Other chronic pain: Secondary | ICD-10-CM | POA: Diagnosis not present

## 2023-08-01 DIAGNOSIS — I480 Paroxysmal atrial fibrillation: Secondary | ICD-10-CM | POA: Diagnosis not present

## 2023-08-01 DIAGNOSIS — I700 Atherosclerosis of aorta: Secondary | ICD-10-CM | POA: Diagnosis not present

## 2023-08-01 DIAGNOSIS — I129 Hypertensive chronic kidney disease with stage 1 through stage 4 chronic kidney disease, or unspecified chronic kidney disease: Secondary | ICD-10-CM | POA: Diagnosis not present

## 2023-08-01 DIAGNOSIS — M25511 Pain in right shoulder: Secondary | ICD-10-CM | POA: Diagnosis not present

## 2023-08-01 DIAGNOSIS — N1831 Chronic kidney disease, stage 3a: Secondary | ICD-10-CM | POA: Diagnosis not present

## 2023-08-03 ENCOUNTER — Telehealth: Payer: Self-pay | Admitting: Adult Health

## 2023-08-03 NOTE — Telephone Encounter (Signed)
 Pt daughter cancel appt, Appt not need  Appt Canceled

## 2023-08-04 ENCOUNTER — Ambulatory Visit: Admitting: Adult Health

## 2023-08-04 DIAGNOSIS — N1831 Chronic kidney disease, stage 3a: Secondary | ICD-10-CM | POA: Diagnosis not present

## 2023-08-04 DIAGNOSIS — I700 Atherosclerosis of aorta: Secondary | ICD-10-CM | POA: Diagnosis not present

## 2023-08-04 DIAGNOSIS — I129 Hypertensive chronic kidney disease with stage 1 through stage 4 chronic kidney disease, or unspecified chronic kidney disease: Secondary | ICD-10-CM | POA: Diagnosis not present

## 2023-08-04 DIAGNOSIS — I480 Paroxysmal atrial fibrillation: Secondary | ICD-10-CM | POA: Diagnosis not present

## 2023-08-04 DIAGNOSIS — G8929 Other chronic pain: Secondary | ICD-10-CM | POA: Diagnosis not present

## 2023-08-04 DIAGNOSIS — M25511 Pain in right shoulder: Secondary | ICD-10-CM | POA: Diagnosis not present

## 2023-08-09 DIAGNOSIS — M25511 Pain in right shoulder: Secondary | ICD-10-CM | POA: Diagnosis not present

## 2023-08-09 DIAGNOSIS — N1831 Chronic kidney disease, stage 3a: Secondary | ICD-10-CM | POA: Diagnosis not present

## 2023-08-09 DIAGNOSIS — I700 Atherosclerosis of aorta: Secondary | ICD-10-CM | POA: Diagnosis not present

## 2023-08-09 DIAGNOSIS — I480 Paroxysmal atrial fibrillation: Secondary | ICD-10-CM | POA: Diagnosis not present

## 2023-08-09 DIAGNOSIS — I129 Hypertensive chronic kidney disease with stage 1 through stage 4 chronic kidney disease, or unspecified chronic kidney disease: Secondary | ICD-10-CM | POA: Diagnosis not present

## 2023-08-09 DIAGNOSIS — G8929 Other chronic pain: Secondary | ICD-10-CM | POA: Diagnosis not present

## 2023-08-11 DIAGNOSIS — I480 Paroxysmal atrial fibrillation: Secondary | ICD-10-CM | POA: Diagnosis not present

## 2023-08-11 DIAGNOSIS — I700 Atherosclerosis of aorta: Secondary | ICD-10-CM | POA: Diagnosis not present

## 2023-08-11 DIAGNOSIS — M25511 Pain in right shoulder: Secondary | ICD-10-CM | POA: Diagnosis not present

## 2023-08-11 DIAGNOSIS — I129 Hypertensive chronic kidney disease with stage 1 through stage 4 chronic kidney disease, or unspecified chronic kidney disease: Secondary | ICD-10-CM | POA: Diagnosis not present

## 2023-08-11 DIAGNOSIS — N1831 Chronic kidney disease, stage 3a: Secondary | ICD-10-CM | POA: Diagnosis not present

## 2023-08-11 DIAGNOSIS — G8929 Other chronic pain: Secondary | ICD-10-CM | POA: Diagnosis not present

## 2023-08-15 DIAGNOSIS — I480 Paroxysmal atrial fibrillation: Secondary | ICD-10-CM | POA: Diagnosis not present

## 2023-08-15 DIAGNOSIS — M25511 Pain in right shoulder: Secondary | ICD-10-CM | POA: Diagnosis not present

## 2023-08-15 DIAGNOSIS — I129 Hypertensive chronic kidney disease with stage 1 through stage 4 chronic kidney disease, or unspecified chronic kidney disease: Secondary | ICD-10-CM | POA: Diagnosis not present

## 2023-08-15 DIAGNOSIS — I700 Atherosclerosis of aorta: Secondary | ICD-10-CM | POA: Diagnosis not present

## 2023-08-15 DIAGNOSIS — N1831 Chronic kidney disease, stage 3a: Secondary | ICD-10-CM | POA: Diagnosis not present

## 2023-08-15 DIAGNOSIS — G8929 Other chronic pain: Secondary | ICD-10-CM | POA: Diagnosis not present

## 2023-08-17 DIAGNOSIS — I129 Hypertensive chronic kidney disease with stage 1 through stage 4 chronic kidney disease, or unspecified chronic kidney disease: Secondary | ICD-10-CM | POA: Diagnosis not present

## 2023-08-17 DIAGNOSIS — I480 Paroxysmal atrial fibrillation: Secondary | ICD-10-CM | POA: Diagnosis not present

## 2023-08-17 DIAGNOSIS — N1831 Chronic kidney disease, stage 3a: Secondary | ICD-10-CM | POA: Diagnosis not present

## 2023-08-17 DIAGNOSIS — I700 Atherosclerosis of aorta: Secondary | ICD-10-CM | POA: Diagnosis not present

## 2023-08-17 DIAGNOSIS — G8929 Other chronic pain: Secondary | ICD-10-CM | POA: Diagnosis not present

## 2023-08-17 DIAGNOSIS — M25511 Pain in right shoulder: Secondary | ICD-10-CM | POA: Diagnosis not present

## 2023-08-19 DIAGNOSIS — J849 Interstitial pulmonary disease, unspecified: Secondary | ICD-10-CM | POA: Diagnosis not present

## 2023-08-19 DIAGNOSIS — E2740 Unspecified adrenocortical insufficiency: Secondary | ICD-10-CM | POA: Diagnosis not present

## 2023-08-19 DIAGNOSIS — N1831 Chronic kidney disease, stage 3a: Secondary | ICD-10-CM | POA: Diagnosis not present

## 2023-08-19 DIAGNOSIS — M5416 Radiculopathy, lumbar region: Secondary | ICD-10-CM | POA: Diagnosis not present

## 2023-08-19 DIAGNOSIS — M159 Polyosteoarthritis, unspecified: Secondary | ICD-10-CM | POA: Diagnosis not present

## 2023-08-19 DIAGNOSIS — J310 Chronic rhinitis: Secondary | ICD-10-CM | POA: Diagnosis not present

## 2023-08-19 DIAGNOSIS — E786 Lipoprotein deficiency: Secondary | ICD-10-CM | POA: Diagnosis not present

## 2023-08-19 DIAGNOSIS — D7589 Other specified diseases of blood and blood-forming organs: Secondary | ICD-10-CM | POA: Diagnosis not present

## 2023-08-19 DIAGNOSIS — Z7952 Long term (current) use of systemic steroids: Secondary | ICD-10-CM | POA: Diagnosis not present

## 2023-08-19 DIAGNOSIS — G4733 Obstructive sleep apnea (adult) (pediatric): Secondary | ICD-10-CM | POA: Diagnosis not present

## 2023-08-19 DIAGNOSIS — I129 Hypertensive chronic kidney disease with stage 1 through stage 4 chronic kidney disease, or unspecified chronic kidney disease: Secondary | ICD-10-CM | POA: Diagnosis not present

## 2023-08-19 DIAGNOSIS — M48061 Spinal stenosis, lumbar region without neurogenic claudication: Secondary | ICD-10-CM | POA: Diagnosis not present

## 2023-08-19 DIAGNOSIS — E871 Hypo-osmolality and hyponatremia: Secondary | ICD-10-CM | POA: Diagnosis not present

## 2023-08-19 DIAGNOSIS — I700 Atherosclerosis of aorta: Secondary | ICD-10-CM | POA: Diagnosis not present

## 2023-08-19 DIAGNOSIS — M25511 Pain in right shoulder: Secondary | ICD-10-CM | POA: Diagnosis not present

## 2023-08-19 DIAGNOSIS — Z87891 Personal history of nicotine dependence: Secondary | ICD-10-CM | POA: Diagnosis not present

## 2023-08-19 DIAGNOSIS — D619 Aplastic anemia, unspecified: Secondary | ICD-10-CM | POA: Diagnosis not present

## 2023-08-19 DIAGNOSIS — D692 Other nonthrombocytopenic purpura: Secondary | ICD-10-CM | POA: Diagnosis not present

## 2023-08-19 DIAGNOSIS — I480 Paroxysmal atrial fibrillation: Secondary | ICD-10-CM | POA: Diagnosis not present

## 2023-08-19 DIAGNOSIS — M5430 Sciatica, unspecified side: Secondary | ICD-10-CM | POA: Diagnosis not present

## 2023-08-19 DIAGNOSIS — D519 Vitamin B12 deficiency anemia, unspecified: Secondary | ICD-10-CM | POA: Diagnosis not present

## 2023-08-19 DIAGNOSIS — I69320 Aphasia following cerebral infarction: Secondary | ICD-10-CM | POA: Diagnosis not present

## 2023-08-19 DIAGNOSIS — G8929 Other chronic pain: Secondary | ICD-10-CM | POA: Diagnosis not present

## 2023-08-19 DIAGNOSIS — H3411 Central retinal artery occlusion, right eye: Secondary | ICD-10-CM | POA: Diagnosis not present

## 2023-08-19 DIAGNOSIS — D6869 Other thrombophilia: Secondary | ICD-10-CM | POA: Diagnosis not present

## 2023-08-22 DIAGNOSIS — M25511 Pain in right shoulder: Secondary | ICD-10-CM | POA: Diagnosis not present

## 2023-08-22 DIAGNOSIS — I700 Atherosclerosis of aorta: Secondary | ICD-10-CM | POA: Diagnosis not present

## 2023-08-22 DIAGNOSIS — G8929 Other chronic pain: Secondary | ICD-10-CM | POA: Diagnosis not present

## 2023-08-22 DIAGNOSIS — N1831 Chronic kidney disease, stage 3a: Secondary | ICD-10-CM | POA: Diagnosis not present

## 2023-08-22 DIAGNOSIS — I480 Paroxysmal atrial fibrillation: Secondary | ICD-10-CM | POA: Diagnosis not present

## 2023-08-22 DIAGNOSIS — I129 Hypertensive chronic kidney disease with stage 1 through stage 4 chronic kidney disease, or unspecified chronic kidney disease: Secondary | ICD-10-CM | POA: Diagnosis not present

## 2023-08-23 ENCOUNTER — Ambulatory Visit (INDEPENDENT_AMBULATORY_CARE_PROVIDER_SITE_OTHER): Payer: Medicare Other | Admitting: Internal Medicine

## 2023-08-23 ENCOUNTER — Encounter: Payer: Self-pay | Admitting: Internal Medicine

## 2023-08-23 VITALS — Ht 60.0 in | Wt 145.0 lb

## 2023-08-23 DIAGNOSIS — E2740 Unspecified adrenocortical insufficiency: Secondary | ICD-10-CM

## 2023-08-23 MED ORDER — HYDROCORTISONE 5 MG PO TABS: 5.0000 mg | ORAL_TABLET | ORAL | 3 refills | Status: DC

## 2023-08-23 NOTE — Progress Notes (Signed)
 Name: [name]  MRN/ DOB: [id number], March 30, 1931    Age/ Sex: 89 y.o., female    PCP: Shayne Anes, MD   Reason for Endocrinology Evaluation: Adrenal insufficiency     Date of Initial Endocrinology Evaluation: 10/19/2022    HPI: [name] is a 89 y.o. female with a past medical history of A-fib, OSA, interstitial lung disease. The patient presented for initial endocrinology clinic visit on 10/19/2022  for consultative assistance with her adrenal insufficiency.   The patient has been referred here for further evaluation of adrenal insufficiency  [name] was started on glucocorticoid therapy for back pain approximately a year and a half ago  There has been at least 3 different attempts to weaning her off glucocorticoids but she ends up with asthenia requiring ED visits.   Her back issues have resolved, she does use the walker occasionally  On her initial visit to our clinic she was on prednisone , I switched her to hydrocortisone    ACTH  undetectable 09/2022  SUBJECTIVE:    Today (08/23/23):  [name] is here for follow-up on secondary adrenal insufficiency.   Patient follows with cardiology for paroxysmal A-fib  Patient has been noted with weight loss Denies nausea or vomiting  Denies constipation or diarrhea  Denies headaches  Denies dizziness  Stable joint pains , has OA of thumbs  She did receive intra-muscular glycocorticoid for right shoulder pain   hydrocortisone  5 mg, 3 tablets every morning with breakfast and 1 tablet in the afternoon between 2-4 PM      HISTORY:  Past Medical History:  Past Medical History:  Diagnosis Date   Atrial fibrillation (HCC)    Prior treatment with amiodarone, held sinus rhythm and drug stopped   Atrial flutter (HCC)    Status post ablation prior to  2009   Chest pain    Nuclear, 2004, no scar or ischemia, EF 77%   Ejection fraction    EF 70%, nuclear,  2004  //      Fall    July, 2014   FUO (fever of  unknown origin) 11/15/2021   H/O bladder repair surgery    OSA (obstructive sleep apnea)    Right Achilles tendinitis    Sciatica    Past Surgical History:  Past Surgical History:  Procedure Laterality Date   IR CT HEAD LTD  11/14/2019   IR PERCUTANEOUS ART THROMBECTOMY/INFUSION INTRACRANIAL INC DIAG ANGIO  11/14/2019   IR RADIOLOGIST EVAL & MGMT  02/20/2020   IR US  GUIDE VASC ACCESS RIGHT  11/14/2019   no surgical hx     RADIOLOGY WITH ANESTHESIA N/A 11/13/2019   Procedure: IR WITH ANESTHESIA;  Surgeon: Dolphus Carrion, MD;  Location: [facility] OR;  Service: Radiology;  Laterality: N/A;    Social History:  reports that she has quit smoking. Her smoking use included cigarettes. She has a 10 pack-year smoking history. She has never used smokeless tobacco. She reports current alcohol  use. She reports that she does not use drugs. Family History: family history includes Cancer in her father and mother; Heart disease in her brother.   HOME MEDICATIONS: Allergies as of 08/23/2023       Reactions   Bee Venom Anaphylaxis   Mixed Vespid Venom Anaphylaxis   Wasp Venom Anaphylaxis   White-faced wasps   Yellow Jacket Venom Anaphylaxis   Codeine Nausea Only, Other (See Comments)   Felt faint, also   Sulfa Antibiotics Itching, Nausea Only   Erythromycin Nausea  And Vomiting        Medication List        Accurate as of August 23, 2023  2:13 PM. If you have any questions, ask your nurse or doctor.          Abrysvo 120 MCG/0.5ML injection Generic drug: RSV bivalent vaccine   AeroChamber Z-Stat Plus Chambr Misc See admin instructions.   albuterol  108 (90 Base) MCG/ACT inhaler Commonly known as: VENTOLIN  HFA Inhale 1-2 puffs into the lungs every 6 (six) hours as needed. What changed: reasons to take this   amLODipine  2.5 MG tablet Commonly known as: NORVASC  Take 1 tablet (2.5 mg total) by mouth daily.   CRANBERRY PO Take 650 mg by mouth in the morning.   Eliquis  5 MG Tabs  tablet Generic drug: apixaban  TAKE 1 TABLET TWICE A DAY   estradiol 0.1 MG/GM vaginal cream Commonly known as: ESTRACE Place vaginally.   famotidine  40 MG tablet Commonly known as: PEPCID  Take 40 mg by mouth daily before supper.   galantamine 8 MG 24 hr capsule Commonly known as: RAZADYNE ER Take 8 mg by mouth every morning.   Gemtesa 75 MG Tabs Generic drug: Vibegron Take 75 mg by mouth at bedtime.   hydrocortisone  5 MG tablet Commonly known as: CORTEF  Take 1 tablet (5 mg total) by mouth as directed. 3 tablets in the morning and 1 tablet between 2-4 pm   ipratropium-albuterol  0.5-2.5 (3) MG/3ML Soln Commonly known as: DUONEB Take 3 mLs by nebulization every 6 (six) hours as needed. What changed: when to take this   ipratropium-albuterol  0.5-2.5 (3) MG/3ML Soln Commonly known as: DUONEB Take 3 mLs by nebulization in the morning and at bedtime. What changed: Another medication with the same name was changed. Make sure you understand how and when to take each.   [facility] Health Caps Take 1 capsule by mouth every evening.   SENIOR VITES PO Take 1 tablet by mouth daily with breakfast.   trimethoprim 100 MG tablet Commonly known as: TRIMPEX Take 100 mg by mouth daily.   vitamin D3 50 MCG (2000 UT) Caps Take 2,000 Units by mouth in the morning.          REVIEW OF SYSTEMS: A comprehensive ROS was conducted with the patient and is negative except as per HPI    OBJECTIVE:  VS: Ht 5' (1.524 m)   Wt 145 lb (65.8 kg)   LMP  (LMP Unknown)   BMI 28.32 kg/m    Wt Readings from Last 3 Encounters:  08/23/23 145 lb (65.8 kg)  02/23/23 152 lb (68.9 kg)  10/21/22 154 lb 1.6 oz (69.9 kg)   Body surface area is 1.67 meters squared.   EXAM: General: Pt appears well and is in NAD  Neck:  Thyroid : Thyroid  size normal.  No goiter or nodules appreciated.   Lungs: Clear with good BS bilat   Heart: Auscultation: RRR.  Abdomen: Soft, nontender  Extremities:  BL  LE: No pretibial edema   Mental Status: Judgment, insight: Intact Orientation: Oriented to time, place, and person Mood and affect: No depression, anxiety, or agitation     DATA REVIEWED:    Latest Reference Range & Units 10/19/22 14:49  Sodium 135 - 145 mEq/L 141  Potassium 3.5 - 5.1 mEq/L 4.7  Chloride 96 - 112 mEq/L 104  CO2 19 - 32 mEq/L 26  Glucose 70 - 99 mg/dL 893 (H)  BUN 6 - 23 mg/dL 16  Creatinine 9.59 - 8.79  mg/dL 8.72 (H)  Calcium  8.4 - 10.5 mg/dL 9.7  GFR >39.99 mL/min 37.44 (L)    Latest Reference Range & Units 10/19/22 14:49  Cortisol, Plasma ug/dL 5.5  Glucose 70 - 99 mg/dL 893 (H)  TSH 9.64 - 4.49 uIU/mL 2.78  Old records , labs and images have been reviewed.    ASSESSMENT/PLAN/RECOMMENDATIONS:   Secondary adrenal insufficiency:  -This has been attributed to chronic glucocorticoid therapy for back pain -ACTH  undetectable 09/2022, and again in January, 2025 -Patient advised again to obtain a medical alert bracelet -Sick stable discussed  Medications :  Continue hydrocortisone  5 mg, 3 tablets every morning with breakfast and 1 tablet in the afternoon between 2-4 PM  Follow-up in 1 yr    Signed electronically by: Stefano Redgie Butts, MD  [facility]  [facility] [address] [place], [place] [address] Phone: [phone] FAX: [phone]   CC: Shayne Anes, MD [address] [place] [address] Phone: [phone] Fax: [phone]   Return to Endocrinology clinic as below: No future appointments.

## 2023-08-23 NOTE — Patient Instructions (Signed)
 Please obtain a medical alert bracelet for " Adrenal Insufficiency"   Hydrocortisone 5 mg, take 3 tablets every morning with breakfast and 1 tablet in the afternoon    ADRENAL INSUFFICIENCY SICK DAY RULES:  Should you face an extreme emotional or physical stress such as trauma, surgery or acute illness, this will require extra steroid coverage so that the body can meet that stress.   Without increasing the steroid dose you may experience severe weakness, headache, dizziness, nausea and vomiting and possibly a more serious deterioration in health.  Typically the dose of steroids will only need to be increased for a couple of days if you have an illness that is transient and managed in the community.   If you are unable to take/absorb an increased dose of steroids orally because of vomiting or diarrhea, you will urgently require steroid injections and should present to an Emergency Department.  The general advice for any serious illness is as follows: Double the normal daily steroid dose for up to 3 days if you have a temperature of more than 37.50C (99.50F) with signs of sickness, or severe emotional or physical distress Contact your primary care doctor and Endocrinologist if the illness worsens or it lasts for more than 3 days.  In cases of severe illness, urgent medical assistance should be promptly sought. If you experience vomiting/diarrhea or are unable to take steroids by mouth, please administer the Hydrocortisone injection kit and seek urgent medical help.

## 2023-08-26 DIAGNOSIS — I480 Paroxysmal atrial fibrillation: Secondary | ICD-10-CM | POA: Diagnosis not present

## 2023-08-26 DIAGNOSIS — I700 Atherosclerosis of aorta: Secondary | ICD-10-CM | POA: Diagnosis not present

## 2023-08-26 DIAGNOSIS — I129 Hypertensive chronic kidney disease with stage 1 through stage 4 chronic kidney disease, or unspecified chronic kidney disease: Secondary | ICD-10-CM | POA: Diagnosis not present

## 2023-08-26 DIAGNOSIS — G8929 Other chronic pain: Secondary | ICD-10-CM | POA: Diagnosis not present

## 2023-08-26 DIAGNOSIS — N1831 Chronic kidney disease, stage 3a: Secondary | ICD-10-CM | POA: Diagnosis not present

## 2023-08-26 DIAGNOSIS — M25511 Pain in right shoulder: Secondary | ICD-10-CM | POA: Diagnosis not present

## 2023-08-29 DIAGNOSIS — I480 Paroxysmal atrial fibrillation: Secondary | ICD-10-CM | POA: Diagnosis not present

## 2023-08-29 DIAGNOSIS — I129 Hypertensive chronic kidney disease with stage 1 through stage 4 chronic kidney disease, or unspecified chronic kidney disease: Secondary | ICD-10-CM | POA: Diagnosis not present

## 2023-08-29 DIAGNOSIS — G8929 Other chronic pain: Secondary | ICD-10-CM | POA: Diagnosis not present

## 2023-08-29 DIAGNOSIS — N1831 Chronic kidney disease, stage 3a: Secondary | ICD-10-CM | POA: Diagnosis not present

## 2023-08-29 DIAGNOSIS — I700 Atherosclerosis of aorta: Secondary | ICD-10-CM | POA: Diagnosis not present

## 2023-08-29 DIAGNOSIS — M25511 Pain in right shoulder: Secondary | ICD-10-CM | POA: Diagnosis not present

## 2023-09-08 ENCOUNTER — Other Ambulatory Visit: Payer: Self-pay | Admitting: Cardiology

## 2023-09-08 DIAGNOSIS — Z961 Presence of intraocular lens: Secondary | ICD-10-CM | POA: Diagnosis not present

## 2023-09-08 DIAGNOSIS — I480 Paroxysmal atrial fibrillation: Secondary | ICD-10-CM

## 2023-09-08 DIAGNOSIS — H26491 Other secondary cataract, right eye: Secondary | ICD-10-CM | POA: Diagnosis not present

## 2023-09-08 DIAGNOSIS — D3131 Benign neoplasm of right choroid: Secondary | ICD-10-CM | POA: Diagnosis not present

## 2023-09-08 DIAGNOSIS — H02834 Dermatochalasis of left upper eyelid: Secondary | ICD-10-CM | POA: Diagnosis not present

## 2023-09-08 DIAGNOSIS — H02831 Dermatochalasis of right upper eyelid: Secondary | ICD-10-CM | POA: Diagnosis not present

## 2023-09-08 DIAGNOSIS — H35371 Puckering of macula, right eye: Secondary | ICD-10-CM | POA: Diagnosis not present

## 2023-09-08 NOTE — Telephone Encounter (Signed)
 Prescription refill request for Eliquis  received. Indication:Aflutter Last office visit: 06/28/22 Cecile)  Scr: 1.03 (10/21/22)  Age: 89 Weight: 65.8kg  Office visit overdue. Message sent to schedulers.

## 2023-09-08 NOTE — Telephone Encounter (Signed)
 Refill Request.

## 2023-09-09 DIAGNOSIS — G8929 Other chronic pain: Secondary | ICD-10-CM | POA: Diagnosis not present

## 2023-09-09 DIAGNOSIS — I700 Atherosclerosis of aorta: Secondary | ICD-10-CM | POA: Diagnosis not present

## 2023-09-09 DIAGNOSIS — I480 Paroxysmal atrial fibrillation: Secondary | ICD-10-CM | POA: Diagnosis not present

## 2023-09-09 DIAGNOSIS — M25511 Pain in right shoulder: Secondary | ICD-10-CM | POA: Diagnosis not present

## 2023-09-09 DIAGNOSIS — I129 Hypertensive chronic kidney disease with stage 1 through stage 4 chronic kidney disease, or unspecified chronic kidney disease: Secondary | ICD-10-CM | POA: Diagnosis not present

## 2023-09-09 DIAGNOSIS — N1831 Chronic kidney disease, stage 3a: Secondary | ICD-10-CM | POA: Diagnosis not present

## 2023-09-09 NOTE — Telephone Encounter (Signed)
 Called and spoke to pt's daughter, Corean, and informed her that pt will need to see cardiologist yearly for us  to continue refilling Eliquis . Transferred daughter over to the mainline to make an appointment.

## 2023-09-09 NOTE — Telephone Encounter (Signed)
 Called and [facility]

## 2023-09-14 DIAGNOSIS — I700 Atherosclerosis of aorta: Secondary | ICD-10-CM | POA: Diagnosis not present

## 2023-09-14 DIAGNOSIS — N1831 Chronic kidney disease, stage 3a: Secondary | ICD-10-CM | POA: Diagnosis not present

## 2023-09-14 DIAGNOSIS — M25511 Pain in right shoulder: Secondary | ICD-10-CM | POA: Diagnosis not present

## 2023-09-14 DIAGNOSIS — I129 Hypertensive chronic kidney disease with stage 1 through stage 4 chronic kidney disease, or unspecified chronic kidney disease: Secondary | ICD-10-CM | POA: Diagnosis not present

## 2023-09-14 DIAGNOSIS — G8929 Other chronic pain: Secondary | ICD-10-CM | POA: Diagnosis not present

## 2023-09-14 DIAGNOSIS — I480 Paroxysmal atrial fibrillation: Secondary | ICD-10-CM | POA: Diagnosis not present

## 2023-09-18 DIAGNOSIS — J849 Interstitial pulmonary disease, unspecified: Secondary | ICD-10-CM | POA: Diagnosis not present

## 2023-09-18 DIAGNOSIS — Z87891 Personal history of nicotine dependence: Secondary | ICD-10-CM | POA: Diagnosis not present

## 2023-09-18 DIAGNOSIS — J310 Chronic rhinitis: Secondary | ICD-10-CM | POA: Diagnosis not present

## 2023-09-18 DIAGNOSIS — H3411 Central retinal artery occlusion, right eye: Secondary | ICD-10-CM | POA: Diagnosis not present

## 2023-09-18 DIAGNOSIS — D519 Vitamin B12 deficiency anemia, unspecified: Secondary | ICD-10-CM | POA: Diagnosis not present

## 2023-09-18 DIAGNOSIS — D619 Aplastic anemia, unspecified: Secondary | ICD-10-CM | POA: Diagnosis not present

## 2023-09-18 DIAGNOSIS — G8929 Other chronic pain: Secondary | ICD-10-CM | POA: Diagnosis not present

## 2023-09-18 DIAGNOSIS — M25511 Pain in right shoulder: Secondary | ICD-10-CM | POA: Diagnosis not present

## 2023-09-18 DIAGNOSIS — D6869 Other thrombophilia: Secondary | ICD-10-CM | POA: Diagnosis not present

## 2023-09-18 DIAGNOSIS — Z7901 Long term (current) use of anticoagulants: Secondary | ICD-10-CM | POA: Diagnosis not present

## 2023-09-18 DIAGNOSIS — D692 Other nonthrombocytopenic purpura: Secondary | ICD-10-CM | POA: Diagnosis not present

## 2023-09-18 DIAGNOSIS — I480 Paroxysmal atrial fibrillation: Secondary | ICD-10-CM | POA: Diagnosis not present

## 2023-09-18 DIAGNOSIS — N1831 Chronic kidney disease, stage 3a: Secondary | ICD-10-CM | POA: Diagnosis not present

## 2023-09-18 DIAGNOSIS — I129 Hypertensive chronic kidney disease with stage 1 through stage 4 chronic kidney disease, or unspecified chronic kidney disease: Secondary | ICD-10-CM | POA: Diagnosis not present

## 2023-09-18 DIAGNOSIS — G4733 Obstructive sleep apnea (adult) (pediatric): Secondary | ICD-10-CM | POA: Diagnosis not present

## 2023-09-18 DIAGNOSIS — I700 Atherosclerosis of aorta: Secondary | ICD-10-CM | POA: Diagnosis not present

## 2023-09-18 DIAGNOSIS — M5430 Sciatica, unspecified side: Secondary | ICD-10-CM | POA: Diagnosis not present

## 2023-09-18 DIAGNOSIS — M159 Polyosteoarthritis, unspecified: Secondary | ICD-10-CM | POA: Diagnosis not present

## 2023-09-18 DIAGNOSIS — Z7952 Long term (current) use of systemic steroids: Secondary | ICD-10-CM | POA: Diagnosis not present

## 2023-09-18 DIAGNOSIS — I69320 Aphasia following cerebral infarction: Secondary | ICD-10-CM | POA: Diagnosis not present

## 2023-09-18 DIAGNOSIS — E786 Lipoprotein deficiency: Secondary | ICD-10-CM | POA: Diagnosis not present

## 2023-09-18 DIAGNOSIS — M5416 Radiculopathy, lumbar region: Secondary | ICD-10-CM | POA: Diagnosis not present

## 2023-09-18 DIAGNOSIS — M48061 Spinal stenosis, lumbar region without neurogenic claudication: Secondary | ICD-10-CM | POA: Diagnosis not present

## 2023-09-18 DIAGNOSIS — D7589 Other specified diseases of blood and blood-forming organs: Secondary | ICD-10-CM | POA: Diagnosis not present

## 2023-09-18 DIAGNOSIS — E2740 Unspecified adrenocortical insufficiency: Secondary | ICD-10-CM | POA: Diagnosis not present

## 2023-09-22 DIAGNOSIS — I129 Hypertensive chronic kidney disease with stage 1 through stage 4 chronic kidney disease, or unspecified chronic kidney disease: Secondary | ICD-10-CM | POA: Diagnosis not present

## 2023-09-22 DIAGNOSIS — N1831 Chronic kidney disease, stage 3a: Secondary | ICD-10-CM | POA: Diagnosis not present

## 2023-09-22 DIAGNOSIS — M25511 Pain in right shoulder: Secondary | ICD-10-CM | POA: Diagnosis not present

## 2023-09-22 DIAGNOSIS — I700 Atherosclerosis of aorta: Secondary | ICD-10-CM | POA: Diagnosis not present

## 2023-09-22 DIAGNOSIS — I480 Paroxysmal atrial fibrillation: Secondary | ICD-10-CM | POA: Diagnosis not present

## 2023-09-22 DIAGNOSIS — G8929 Other chronic pain: Secondary | ICD-10-CM | POA: Diagnosis not present

## 2023-09-28 DIAGNOSIS — I129 Hypertensive chronic kidney disease with stage 1 through stage 4 chronic kidney disease, or unspecified chronic kidney disease: Secondary | ICD-10-CM | POA: Diagnosis not present

## 2023-09-28 DIAGNOSIS — I480 Paroxysmal atrial fibrillation: Secondary | ICD-10-CM | POA: Diagnosis not present

## 2023-09-28 DIAGNOSIS — M25511 Pain in right shoulder: Secondary | ICD-10-CM | POA: Diagnosis not present

## 2023-09-28 DIAGNOSIS — N1831 Chronic kidney disease, stage 3a: Secondary | ICD-10-CM | POA: Diagnosis not present

## 2023-09-28 DIAGNOSIS — G8929 Other chronic pain: Secondary | ICD-10-CM | POA: Diagnosis not present

## 2023-09-28 DIAGNOSIS — I700 Atherosclerosis of aorta: Secondary | ICD-10-CM | POA: Diagnosis not present

## 2023-10-04 DIAGNOSIS — I700 Atherosclerosis of aorta: Secondary | ICD-10-CM | POA: Diagnosis not present

## 2023-10-04 DIAGNOSIS — I129 Hypertensive chronic kidney disease with stage 1 through stage 4 chronic kidney disease, or unspecified chronic kidney disease: Secondary | ICD-10-CM | POA: Diagnosis not present

## 2023-10-04 DIAGNOSIS — I480 Paroxysmal atrial fibrillation: Secondary | ICD-10-CM | POA: Diagnosis not present

## 2023-10-04 DIAGNOSIS — G8929 Other chronic pain: Secondary | ICD-10-CM | POA: Diagnosis not present

## 2023-10-04 DIAGNOSIS — M25511 Pain in right shoulder: Secondary | ICD-10-CM | POA: Diagnosis not present

## 2023-10-04 DIAGNOSIS — N1831 Chronic kidney disease, stage 3a: Secondary | ICD-10-CM | POA: Diagnosis not present

## 2023-10-11 DIAGNOSIS — I129 Hypertensive chronic kidney disease with stage 1 through stage 4 chronic kidney disease, or unspecified chronic kidney disease: Secondary | ICD-10-CM | POA: Diagnosis not present

## 2023-10-11 DIAGNOSIS — I700 Atherosclerosis of aorta: Secondary | ICD-10-CM | POA: Diagnosis not present

## 2023-10-11 DIAGNOSIS — I480 Paroxysmal atrial fibrillation: Secondary | ICD-10-CM | POA: Diagnosis not present

## 2023-10-11 DIAGNOSIS — G8929 Other chronic pain: Secondary | ICD-10-CM | POA: Diagnosis not present

## 2023-10-11 DIAGNOSIS — N1831 Chronic kidney disease, stage 3a: Secondary | ICD-10-CM | POA: Diagnosis not present

## 2023-10-11 DIAGNOSIS — M25511 Pain in right shoulder: Secondary | ICD-10-CM | POA: Diagnosis not present

## 2023-10-17 DIAGNOSIS — I480 Paroxysmal atrial fibrillation: Secondary | ICD-10-CM | POA: Diagnosis not present

## 2023-10-17 DIAGNOSIS — I700 Atherosclerosis of aorta: Secondary | ICD-10-CM | POA: Diagnosis not present

## 2023-10-17 DIAGNOSIS — G8929 Other chronic pain: Secondary | ICD-10-CM | POA: Diagnosis not present

## 2023-10-17 DIAGNOSIS — N1831 Chronic kidney disease, stage 3a: Secondary | ICD-10-CM | POA: Diagnosis not present

## 2023-10-17 DIAGNOSIS — I129 Hypertensive chronic kidney disease with stage 1 through stage 4 chronic kidney disease, or unspecified chronic kidney disease: Secondary | ICD-10-CM | POA: Diagnosis not present

## 2023-10-17 DIAGNOSIS — M25511 Pain in right shoulder: Secondary | ICD-10-CM | POA: Diagnosis not present

## 2023-10-18 DIAGNOSIS — Z87891 Personal history of nicotine dependence: Secondary | ICD-10-CM | POA: Diagnosis not present

## 2023-10-18 DIAGNOSIS — D519 Vitamin B12 deficiency anemia, unspecified: Secondary | ICD-10-CM | POA: Diagnosis not present

## 2023-10-18 DIAGNOSIS — I480 Paroxysmal atrial fibrillation: Secondary | ICD-10-CM | POA: Diagnosis not present

## 2023-10-18 DIAGNOSIS — M25511 Pain in right shoulder: Secondary | ICD-10-CM | POA: Diagnosis not present

## 2023-10-18 DIAGNOSIS — G8929 Other chronic pain: Secondary | ICD-10-CM | POA: Diagnosis not present

## 2023-10-18 DIAGNOSIS — H3411 Central retinal artery occlusion, right eye: Secondary | ICD-10-CM | POA: Diagnosis not present

## 2023-10-18 DIAGNOSIS — Z7952 Long term (current) use of systemic steroids: Secondary | ICD-10-CM | POA: Diagnosis not present

## 2023-10-18 DIAGNOSIS — E786 Lipoprotein deficiency: Secondary | ICD-10-CM | POA: Diagnosis not present

## 2023-10-18 DIAGNOSIS — D6869 Other thrombophilia: Secondary | ICD-10-CM | POA: Diagnosis not present

## 2023-10-18 DIAGNOSIS — D7589 Other specified diseases of blood and blood-forming organs: Secondary | ICD-10-CM | POA: Diagnosis not present

## 2023-10-18 DIAGNOSIS — N1831 Chronic kidney disease, stage 3a: Secondary | ICD-10-CM | POA: Diagnosis not present

## 2023-10-18 DIAGNOSIS — Z7901 Long term (current) use of anticoagulants: Secondary | ICD-10-CM | POA: Diagnosis not present

## 2023-10-18 DIAGNOSIS — M159 Polyosteoarthritis, unspecified: Secondary | ICD-10-CM | POA: Diagnosis not present

## 2023-10-18 DIAGNOSIS — I69320 Aphasia following cerebral infarction: Secondary | ICD-10-CM | POA: Diagnosis not present

## 2023-10-18 DIAGNOSIS — M48061 Spinal stenosis, lumbar region without neurogenic claudication: Secondary | ICD-10-CM | POA: Diagnosis not present

## 2023-10-18 DIAGNOSIS — G4733 Obstructive sleep apnea (adult) (pediatric): Secondary | ICD-10-CM | POA: Diagnosis not present

## 2023-10-18 DIAGNOSIS — I700 Atherosclerosis of aorta: Secondary | ICD-10-CM | POA: Diagnosis not present

## 2023-10-18 DIAGNOSIS — I129 Hypertensive chronic kidney disease with stage 1 through stage 4 chronic kidney disease, or unspecified chronic kidney disease: Secondary | ICD-10-CM | POA: Diagnosis not present

## 2023-10-18 DIAGNOSIS — M5416 Radiculopathy, lumbar region: Secondary | ICD-10-CM | POA: Diagnosis not present

## 2023-10-18 DIAGNOSIS — D692 Other nonthrombocytopenic purpura: Secondary | ICD-10-CM | POA: Diagnosis not present

## 2023-10-18 DIAGNOSIS — J310 Chronic rhinitis: Secondary | ICD-10-CM | POA: Diagnosis not present

## 2023-10-18 DIAGNOSIS — M5430 Sciatica, unspecified side: Secondary | ICD-10-CM | POA: Diagnosis not present

## 2023-10-18 DIAGNOSIS — J849 Interstitial pulmonary disease, unspecified: Secondary | ICD-10-CM | POA: Diagnosis not present

## 2023-10-18 DIAGNOSIS — D619 Aplastic anemia, unspecified: Secondary | ICD-10-CM | POA: Diagnosis not present

## 2023-10-18 DIAGNOSIS — E2740 Unspecified adrenocortical insufficiency: Secondary | ICD-10-CM | POA: Diagnosis not present

## 2023-10-24 DIAGNOSIS — I129 Hypertensive chronic kidney disease with stage 1 through stage 4 chronic kidney disease, or unspecified chronic kidney disease: Secondary | ICD-10-CM | POA: Diagnosis not present

## 2023-10-24 DIAGNOSIS — N1831 Chronic kidney disease, stage 3a: Secondary | ICD-10-CM | POA: Diagnosis not present

## 2023-10-24 DIAGNOSIS — I700 Atherosclerosis of aorta: Secondary | ICD-10-CM | POA: Diagnosis not present

## 2023-10-24 DIAGNOSIS — I480 Paroxysmal atrial fibrillation: Secondary | ICD-10-CM | POA: Diagnosis not present

## 2023-10-24 DIAGNOSIS — M25511 Pain in right shoulder: Secondary | ICD-10-CM | POA: Diagnosis not present

## 2023-10-24 DIAGNOSIS — G8929 Other chronic pain: Secondary | ICD-10-CM | POA: Diagnosis not present

## 2023-10-27 DIAGNOSIS — D6869 Other thrombophilia: Secondary | ICD-10-CM | POA: Diagnosis not present

## 2023-10-27 DIAGNOSIS — I69320 Aphasia following cerebral infarction: Secondary | ICD-10-CM | POA: Diagnosis not present

## 2023-10-27 DIAGNOSIS — M25511 Pain in right shoulder: Secondary | ICD-10-CM | POA: Diagnosis not present

## 2023-10-27 DIAGNOSIS — E2740 Unspecified adrenocortical insufficiency: Secondary | ICD-10-CM | POA: Diagnosis not present

## 2023-10-27 DIAGNOSIS — I480 Paroxysmal atrial fibrillation: Secondary | ICD-10-CM | POA: Diagnosis not present

## 2023-10-27 DIAGNOSIS — I129 Hypertensive chronic kidney disease with stage 1 through stage 4 chronic kidney disease, or unspecified chronic kidney disease: Secondary | ICD-10-CM | POA: Diagnosis not present

## 2023-10-27 DIAGNOSIS — G8929 Other chronic pain: Secondary | ICD-10-CM | POA: Diagnosis not present

## 2023-10-27 DIAGNOSIS — I700 Atherosclerosis of aorta: Secondary | ICD-10-CM | POA: Diagnosis not present

## 2023-10-27 DIAGNOSIS — M5416 Radiculopathy, lumbar region: Secondary | ICD-10-CM | POA: Diagnosis not present

## 2023-10-27 DIAGNOSIS — J849 Interstitial pulmonary disease, unspecified: Secondary | ICD-10-CM | POA: Diagnosis not present

## 2023-10-27 DIAGNOSIS — N1831 Chronic kidney disease, stage 3a: Secondary | ICD-10-CM | POA: Diagnosis not present

## 2023-10-27 DIAGNOSIS — E786 Lipoprotein deficiency: Secondary | ICD-10-CM | POA: Diagnosis not present

## 2023-10-28 ENCOUNTER — Encounter: Payer: Self-pay | Admitting: Cardiology

## 2023-10-28 ENCOUNTER — Ambulatory Visit: Attending: Cardiology | Admitting: Cardiology

## 2023-10-28 VITALS — BP 134/62 | HR 60 | Ht 60.0 in | Wt 143.0 lb

## 2023-10-28 DIAGNOSIS — I480 Paroxysmal atrial fibrillation: Secondary | ICD-10-CM | POA: Insufficient documentation

## 2023-10-28 DIAGNOSIS — I483 Typical atrial flutter: Secondary | ICD-10-CM | POA: Insufficient documentation

## 2023-10-28 DIAGNOSIS — E785 Hyperlipidemia, unspecified: Secondary | ICD-10-CM | POA: Diagnosis not present

## 2023-10-28 NOTE — Progress Notes (Signed)
  Cardiology Office Note:  .   Date:  10/28/2023  ID:  [name], DOB 16-Sep-1931, MRN [id number] PCP: Melissa Anes, MD  [facility] Providers Cardiologist:  Lynch Parchment, MD    History of Present Illness: Melissa   [name] is a 89 y.o. female Discussed the use of AI scribe software   History of Present Illness [name] is a 89 year old female with atrial fibrillation and a history of stroke who presents for a follow-up visit. She is accompanied by her daughter, [name].  She has a history of atrial fibrillation diagnosed in 2007 and underwent a flutter ablation in 2009. She is currently on Eliquis  to prevent strokes, having experienced a stroke in the past. Her average heart rate was recorded at 56 beats per minute during a prior monitor. An echocardiogram in 2023 showed an ejection fraction of 65-70%.  She has obstructive sleep apnea and secondary adrenal insufficiency, for which she is on chronic glucocorticoid therapy. Her current medication regimen includes hydrocortisone  5 mg, three tablets in the morning and one in the afternoon. Her ACTH  levels were undetectable as of October 15, 2022, as noted by endocrinology.  Her past medical history also includes a prior hemoglobin level of 9.7 and creatinine of 0.6. Recent lab results from last October showed a hemoglobin of 14 and creatinine of 1.1. Her LDL was 52 in 2023 while off atorvastatin  10 mg.  She also takes lisinopril, which she recognizes by appearance.  She experiences weakness and fatigue.      Studies Reviewed: .        Results LABS Hemoglobin: 9.7 (2023) Creatinine: 0.6 (2023) LDL: 52 (2023) ACTH : undetectable (10/15/2022) Hemoglobin: 14 (2024) Creatinine: 1.1 (2024)  DIAGNOSTIC Echocardiogram: EF 65-70% (2023) Risk Assessment/Calculations:           Physical Exam:   VS:  LMP  (LMP Unknown)    Wt Readings from Last 3 Encounters:  08/23/23 145 lb (65.8 kg)  02/23/23 152 lb (68.9 kg)   10/21/22 154 lb 1.6 oz (69.9 kg)    GEN: Well nourished, well developed in no acute distress NECK: No JVD; No carotid bruits CARDIAC: RRR, no murmurs, no rubs, no gallops RESPIRATORY:  Clear to auscultation without rales, wheezing or rhonchi  ABDOMEN: Soft, non-tender, non-distended EXTREMITIES:  No edema; No deformity   ASSESSMENT AND PLAN: .    Assessment and Plan Assessment & Plan Atrial fibrillation and atrial flutter status post ablation with rate control Atrial fibrillation and atrial flutter are under very good rate control. She is on Eliquis  for stroke prevention, crucial given her stroke history. Heart rate is well controlled despite atrial flutter. Blood pressure is well managed, with consideration to stop amlodipine  due to potential for low blood pressure related to adrenal insufficiency. - Continue Eliquis  for stroke prevention - Stop amlodipine  2.5 mg due to good blood pressure control and potential for low blood pressure related to adrenal insufficiency  Secondary adrenal insufficiency on chronic glucocorticoid therapy Secondary adrenal insufficiency is well managed with hydrocortisone  therapy. - Continue hydrocortisone  as prescribed         Dispo: 1 yr  Signed, Lynch Parchment, MD

## 2023-10-28 NOTE — Patient Instructions (Signed)
 Medication Instructions:  Your physician has recommended you make the following change in your medication:  STOP Amlodipine   *If you need a refill on your cardiac medications before your next appointment, please call your pharmacy*  Lab Work: None ordered   Testing/Procedures: None ordered  Follow-Up: At [facility], you and your health needs are our priority.  As part of our continuing mission to provide you with exceptional heart care, our providers are all part of one team.  This team includes your primary Cardiologist (physician) and Advanced Practice Providers or APPs (Physician Assistants and Nurse Practitioners) who all work together to provide you with the care you need, when you need it.  Your next appointment:   1 year(s)  Provider:   Oneil Parchment, MD     Thank you for choosing [facility]!!   [phone]

## 2023-10-31 DIAGNOSIS — M25511 Pain in right shoulder: Secondary | ICD-10-CM | POA: Diagnosis not present

## 2023-10-31 DIAGNOSIS — G8929 Other chronic pain: Secondary | ICD-10-CM | POA: Diagnosis not present

## 2023-10-31 DIAGNOSIS — N1831 Chronic kidney disease, stage 3a: Secondary | ICD-10-CM | POA: Diagnosis not present

## 2023-10-31 DIAGNOSIS — I700 Atherosclerosis of aorta: Secondary | ICD-10-CM | POA: Diagnosis not present

## 2023-10-31 DIAGNOSIS — I480 Paroxysmal atrial fibrillation: Secondary | ICD-10-CM | POA: Diagnosis not present

## 2023-10-31 DIAGNOSIS — I129 Hypertensive chronic kidney disease with stage 1 through stage 4 chronic kidney disease, or unspecified chronic kidney disease: Secondary | ICD-10-CM | POA: Diagnosis not present

## 2023-11-11 DIAGNOSIS — I129 Hypertensive chronic kidney disease with stage 1 through stage 4 chronic kidney disease, or unspecified chronic kidney disease: Secondary | ICD-10-CM | POA: Diagnosis not present

## 2023-11-11 DIAGNOSIS — M25511 Pain in right shoulder: Secondary | ICD-10-CM | POA: Diagnosis not present

## 2023-11-11 DIAGNOSIS — I480 Paroxysmal atrial fibrillation: Secondary | ICD-10-CM | POA: Diagnosis not present

## 2023-11-11 DIAGNOSIS — I700 Atherosclerosis of aorta: Secondary | ICD-10-CM | POA: Diagnosis not present

## 2023-11-11 DIAGNOSIS — G8929 Other chronic pain: Secondary | ICD-10-CM | POA: Diagnosis not present

## 2023-11-11 DIAGNOSIS — N1831 Chronic kidney disease, stage 3a: Secondary | ICD-10-CM | POA: Diagnosis not present

## 2023-11-15 DIAGNOSIS — I129 Hypertensive chronic kidney disease with stage 1 through stage 4 chronic kidney disease, or unspecified chronic kidney disease: Secondary | ICD-10-CM | POA: Diagnosis not present

## 2023-11-15 DIAGNOSIS — N1831 Chronic kidney disease, stage 3a: Secondary | ICD-10-CM | POA: Diagnosis not present

## 2023-11-15 DIAGNOSIS — I480 Paroxysmal atrial fibrillation: Secondary | ICD-10-CM | POA: Diagnosis not present

## 2023-11-15 DIAGNOSIS — G8929 Other chronic pain: Secondary | ICD-10-CM | POA: Diagnosis not present

## 2023-11-15 DIAGNOSIS — I700 Atherosclerosis of aorta: Secondary | ICD-10-CM | POA: Diagnosis not present

## 2023-11-15 DIAGNOSIS — M25511 Pain in right shoulder: Secondary | ICD-10-CM | POA: Diagnosis not present

## 2023-12-07 ENCOUNTER — Ambulatory Visit: Admitting: Cardiology

## 2023-12-08 ENCOUNTER — Other Ambulatory Visit: Payer: Self-pay | Admitting: Cardiology

## 2023-12-08 DIAGNOSIS — I480 Paroxysmal atrial fibrillation: Secondary | ICD-10-CM

## 2023-12-12 NOTE — Telephone Encounter (Signed)
 Prescription refill request for Eliquis  received. Indication: a fib Last office visit: 10/28/23 Scr: overdue    Age: 89 Weight: 64kg  Contacted patient and lmom on daughter's voicemail

## 2023-12-14 ENCOUNTER — Telehealth: Payer: Self-pay | Admitting: *Deleted

## 2023-12-14 DIAGNOSIS — I480 Paroxysmal atrial fibrillation: Secondary | ICD-10-CM

## 2023-12-14 MED ORDER — APIXABAN 5 MG PO TABS
5.0000 mg | ORAL_TABLET | Freq: Two times a day (BID) | ORAL | 2 refills | Status: AC
Start: 2023-12-14 — End: ?

## 2023-12-14 NOTE — Telephone Encounter (Addendum)
 Patient's daughter called to inquire if her mom still needed an appointment for the eliquis  refill. Advised that the appointment with with Dr.Skains was 10/28/23 so that is fine. Advised we do need lab work and advised would call PCP to see if any done recently and have them faxed   Spoke with [name] at Dr. Sheppard office and labs were drawn in June 2025, she will fax them over.   Eliquis  5mg  refill request received. Patient is 89 years old, weight-64.9kg, Crea-1.0 on  07/14/23 via scanned labs from PCP, Diagnosis-Afib, and last seen by Dr. Jeffrie on 10/28/23. Dose is appropriate based on dosing criteria. Will send in refill to requested pharmacy.     Spoke with daughter and updated her regarding the refill and labs being sent. She was thankful for the update.

## 2023-12-14 NOTE — Telephone Encounter (Signed)
 Please refer to Eliquis  refill encounter from 12/14/23.

## 2024-01-09 ENCOUNTER — Telehealth: Payer: Self-pay

## 2024-01-09 NOTE — Telephone Encounter (Signed)
 Notification from [facility] that patient is taking hydrocortisone  different if she is sick.  Left message for patient to schedule appointment to discuss change

## 2024-02-08 ENCOUNTER — Encounter: Payer: Self-pay | Admitting: Internal Medicine

## 2024-02-10 ENCOUNTER — Other Ambulatory Visit: Payer: Self-pay | Admitting: Internal Medicine

## 2024-08-22 ENCOUNTER — Ambulatory Visit: Admitting: Internal Medicine
